# Patient Record
Sex: Male | Born: 1985 | Race: Black or African American | Hispanic: No | Marital: Single | Smoking: Former smoker
Health system: Southern US, Community
[De-identification: ages and names within clinical notes are randomized; demographics above are authoritative.]

## PROBLEM LIST (undated history)

## (undated) DIAGNOSIS — R112 Nausea with vomiting, unspecified: Secondary | ICD-10-CM

## (undated) DIAGNOSIS — M2550 Pain in unspecified joint: Secondary | ICD-10-CM

## (undated) DIAGNOSIS — E079 Disorder of thyroid, unspecified: Secondary | ICD-10-CM

## (undated) DIAGNOSIS — N289 Disorder of kidney and ureter, unspecified: Secondary | ICD-10-CM

## (undated) DIAGNOSIS — R51 Headache: Secondary | ICD-10-CM

## (undated) DIAGNOSIS — M545 Low back pain, unspecified: Secondary | ICD-10-CM

## (undated) DIAGNOSIS — K219 Gastro-esophageal reflux disease without esophagitis: Secondary | ICD-10-CM

## (undated) DIAGNOSIS — M2540 Effusion, unspecified joint: Secondary | ICD-10-CM

## (undated) DIAGNOSIS — G4730 Sleep apnea, unspecified: Secondary | ICD-10-CM

## (undated) DIAGNOSIS — Z8719 Personal history of other diseases of the digestive system: Secondary | ICD-10-CM

## (undated) DIAGNOSIS — N186 End stage renal disease: Secondary | ICD-10-CM

## (undated) DIAGNOSIS — T8859XA Other complications of anesthesia, initial encounter: Secondary | ICD-10-CM

## (undated) DIAGNOSIS — Z8489 Family history of other specified conditions: Secondary | ICD-10-CM

## (undated) DIAGNOSIS — I10 Essential (primary) hypertension: Secondary | ICD-10-CM

## (undated) DIAGNOSIS — G8929 Other chronic pain: Secondary | ICD-10-CM

## (undated) DIAGNOSIS — E213 Hyperparathyroidism, unspecified: Secondary | ICD-10-CM

## (undated) DIAGNOSIS — M1990 Unspecified osteoarthritis, unspecified site: Secondary | ICD-10-CM

## (undated) DIAGNOSIS — Z9889 Other specified postprocedural states: Secondary | ICD-10-CM

## (undated) DIAGNOSIS — R42 Dizziness and giddiness: Secondary | ICD-10-CM

## (undated) DIAGNOSIS — T4145XA Adverse effect of unspecified anesthetic, initial encounter: Secondary | ICD-10-CM

## (undated) DIAGNOSIS — Z992 Dependence on renal dialysis: Secondary | ICD-10-CM

## (undated) DIAGNOSIS — I359 Nonrheumatic aortic valve disorder, unspecified: Secondary | ICD-10-CM

## (undated) HISTORY — DX: Nonrheumatic aortic valve disorder, unspecified: I35.9

## (undated) HISTORY — PX: JOINT REPLACEMENT: SHX530

## (undated) HISTORY — DX: Essential (primary) hypertension: I10

## (undated) HISTORY — DX: Disorder of thyroid, unspecified: E07.9

## (undated) HISTORY — PX: AV FISTULA PLACEMENT: SHX1204

---
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
## 2004-01-24 ENCOUNTER — Ambulatory Visit (HOSPITAL_COMMUNITY): Admission: RE | Admit: 2004-01-24 | Discharge: 2004-01-24 | Payer: Self-pay | Admitting: Nephrology

## 2004-01-24 ENCOUNTER — Encounter (INDEPENDENT_AMBULATORY_CARE_PROVIDER_SITE_OTHER): Payer: Self-pay | Admitting: Specialist

## 2004-01-31 ENCOUNTER — Encounter: Admission: RE | Admit: 2004-01-31 | Discharge: 2004-01-31 | Payer: Self-pay | Admitting: Nephrology

## 2004-02-01 ENCOUNTER — Ambulatory Visit (HOSPITAL_COMMUNITY): Admission: RE | Admit: 2004-02-01 | Discharge: 2004-02-01 | Payer: Self-pay | Admitting: Nephrology

## 2004-04-06 ENCOUNTER — Emergency Department (HOSPITAL_COMMUNITY): Admission: EM | Admit: 2004-04-06 | Discharge: 2004-04-06 | Payer: Self-pay | Admitting: Emergency Medicine

## 2004-05-20 ENCOUNTER — Inpatient Hospital Stay (HOSPITAL_COMMUNITY): Admission: AD | Admit: 2004-05-20 | Discharge: 2004-05-24 | Payer: Self-pay | Admitting: Nephrology

## 2004-06-23 ENCOUNTER — Inpatient Hospital Stay (HOSPITAL_COMMUNITY): Admission: EM | Admit: 2004-06-23 | Discharge: 2004-06-25 | Payer: Self-pay | Admitting: Emergency Medicine

## 2004-09-24 ENCOUNTER — Ambulatory Visit (HOSPITAL_COMMUNITY): Admission: RE | Admit: 2004-09-24 | Discharge: 2004-09-24 | Payer: Self-pay | Admitting: Nephrology

## 2004-11-13 ENCOUNTER — Ambulatory Visit (HOSPITAL_COMMUNITY): Admission: RE | Admit: 2004-11-13 | Discharge: 2004-11-13 | Payer: Self-pay | Admitting: Nephrology

## 2005-02-16 ENCOUNTER — Encounter: Admission: RE | Admit: 2005-02-16 | Discharge: 2005-02-16 | Payer: Self-pay | Admitting: Nephrology

## 2005-04-09 ENCOUNTER — Ambulatory Visit (HOSPITAL_COMMUNITY): Admission: RE | Admit: 2005-04-09 | Discharge: 2005-04-09 | Payer: Self-pay | Admitting: Nephrology

## 2005-04-25 ENCOUNTER — Emergency Department (HOSPITAL_COMMUNITY): Admission: EM | Admit: 2005-04-25 | Discharge: 2005-04-25 | Payer: Self-pay | Admitting: *Deleted

## 2005-04-28 ENCOUNTER — Ambulatory Visit (HOSPITAL_COMMUNITY): Admission: RE | Admit: 2005-04-28 | Discharge: 2005-04-28 | Payer: Self-pay | Admitting: Vascular Surgery

## 2005-08-03 HISTORY — PX: KNEE ARTHROSCOPY: SUR90

## 2005-08-03 HISTORY — PX: AV FISTULA REPAIR: SHX563

## 2005-10-09 ENCOUNTER — Emergency Department (HOSPITAL_COMMUNITY): Admission: EM | Admit: 2005-10-09 | Discharge: 2005-10-09 | Payer: Self-pay | Admitting: Emergency Medicine

## 2005-10-16 ENCOUNTER — Emergency Department (HOSPITAL_COMMUNITY): Admission: EM | Admit: 2005-10-16 | Discharge: 2005-10-16 | Payer: Self-pay | Admitting: Emergency Medicine

## 2005-10-21 ENCOUNTER — Emergency Department (HOSPITAL_COMMUNITY): Admission: EM | Admit: 2005-10-21 | Discharge: 2005-10-21 | Payer: Self-pay | Admitting: Emergency Medicine

## 2005-10-29 ENCOUNTER — Emergency Department (HOSPITAL_COMMUNITY): Admission: EM | Admit: 2005-10-29 | Discharge: 2005-10-29 | Payer: Self-pay | Admitting: Family Medicine

## 2005-11-26 ENCOUNTER — Observation Stay (HOSPITAL_COMMUNITY): Admission: EM | Admit: 2005-11-26 | Discharge: 2005-11-27 | Payer: Self-pay | Admitting: Emergency Medicine

## 2005-12-01 ENCOUNTER — Ambulatory Visit (HOSPITAL_COMMUNITY): Admission: RE | Admit: 2005-12-01 | Discharge: 2005-12-01 | Payer: Self-pay | Admitting: Nephrology

## 2005-12-01 ENCOUNTER — Inpatient Hospital Stay (HOSPITAL_COMMUNITY): Admission: EM | Admit: 2005-12-01 | Discharge: 2005-12-05 | Payer: Self-pay | Admitting: Emergency Medicine

## 2005-12-09 ENCOUNTER — Ambulatory Visit: Payer: Self-pay | Admitting: Gastroenterology

## 2005-12-29 ENCOUNTER — Ambulatory Visit: Payer: Self-pay | Admitting: Gastroenterology

## 2005-12-31 ENCOUNTER — Ambulatory Visit: Payer: Self-pay | Admitting: Gastroenterology

## 2005-12-31 ENCOUNTER — Encounter (INDEPENDENT_AMBULATORY_CARE_PROVIDER_SITE_OTHER): Payer: Self-pay | Admitting: Specialist

## 2006-05-04 ENCOUNTER — Ambulatory Visit (HOSPITAL_COMMUNITY): Admission: RE | Admit: 2006-05-04 | Discharge: 2006-05-04 | Payer: Self-pay | Admitting: Nephrology

## 2006-05-25 ENCOUNTER — Ambulatory Visit (HOSPITAL_COMMUNITY): Admission: RE | Admit: 2006-05-25 | Discharge: 2006-05-25 | Payer: Self-pay | Admitting: Orthopaedic Surgery

## 2006-07-07 ENCOUNTER — Inpatient Hospital Stay (HOSPITAL_COMMUNITY): Admission: EM | Admit: 2006-07-07 | Discharge: 2006-07-10 | Payer: Self-pay | Admitting: Emergency Medicine

## 2006-07-15 ENCOUNTER — Ambulatory Visit: Payer: Self-pay | Admitting: Internal Medicine

## 2006-08-12 ENCOUNTER — Ambulatory Visit (HOSPITAL_COMMUNITY): Admission: RE | Admit: 2006-08-12 | Discharge: 2006-08-12 | Payer: Self-pay | Admitting: Nephrology

## 2006-10-30 ENCOUNTER — Emergency Department (HOSPITAL_COMMUNITY): Admission: EM | Admit: 2006-10-30 | Discharge: 2006-10-30 | Payer: Self-pay | Admitting: Emergency Medicine

## 2006-11-01 ENCOUNTER — Emergency Department (HOSPITAL_COMMUNITY): Admission: EM | Admit: 2006-11-01 | Discharge: 2006-11-01 | Payer: Self-pay | Admitting: Emergency Medicine

## 2006-11-02 ENCOUNTER — Emergency Department (HOSPITAL_COMMUNITY): Admission: EM | Admit: 2006-11-02 | Discharge: 2006-11-03 | Payer: Self-pay | Admitting: Emergency Medicine

## 2006-11-05 ENCOUNTER — Inpatient Hospital Stay (HOSPITAL_COMMUNITY): Admission: EM | Admit: 2006-11-05 | Discharge: 2006-11-06 | Payer: Self-pay | Admitting: Emergency Medicine

## 2006-11-17 ENCOUNTER — Encounter: Admission: RE | Admit: 2006-11-17 | Discharge: 2006-11-17 | Payer: Self-pay | Admitting: Gastroenterology

## 2006-11-17 ENCOUNTER — Ambulatory Visit: Payer: Self-pay | Admitting: Gastroenterology

## 2006-11-25 ENCOUNTER — Ambulatory Visit: Payer: Self-pay | Admitting: Gastroenterology

## 2006-11-25 LAB — CONVERTED CEMR LAB: Iron: 82 ug/dL (ref 42–165)

## 2006-12-07 ENCOUNTER — Ambulatory Visit: Payer: Self-pay | Admitting: Gastroenterology

## 2006-12-07 LAB — CONVERTED CEMR LAB: Hgb A1c MFr Bld: 5.1 % (ref 4.6–6.0)

## 2006-12-16 ENCOUNTER — Ambulatory Visit (HOSPITAL_COMMUNITY): Admission: RE | Admit: 2006-12-16 | Discharge: 2006-12-16 | Payer: Self-pay | Admitting: Gastroenterology

## 2007-10-13 ENCOUNTER — Encounter: Admission: RE | Admit: 2007-10-13 | Discharge: 2007-10-13 | Payer: Self-pay | Admitting: Critical Care Medicine

## 2007-12-05 ENCOUNTER — Emergency Department (HOSPITAL_COMMUNITY): Admission: EM | Admit: 2007-12-05 | Discharge: 2007-12-05 | Payer: Self-pay | Admitting: Emergency Medicine

## 2008-01-09 ENCOUNTER — Emergency Department (HOSPITAL_COMMUNITY): Admission: EM | Admit: 2008-01-09 | Discharge: 2008-01-10 | Payer: Self-pay | Admitting: Family Medicine

## 2008-01-10 ENCOUNTER — Emergency Department (HOSPITAL_COMMUNITY): Admission: EM | Admit: 2008-01-10 | Discharge: 2008-01-11 | Payer: Self-pay | Admitting: Emergency Medicine

## 2008-02-07 ENCOUNTER — Ambulatory Visit (HOSPITAL_COMMUNITY): Admission: RE | Admit: 2008-02-07 | Discharge: 2008-02-07 | Payer: Self-pay | Admitting: Surgery

## 2008-02-16 ENCOUNTER — Encounter: Admission: RE | Admit: 2008-02-16 | Discharge: 2008-02-16 | Payer: Self-pay | Admitting: Surgery

## 2008-05-24 ENCOUNTER — Ambulatory Visit (HOSPITAL_COMMUNITY): Admission: RE | Admit: 2008-05-24 | Discharge: 2008-05-24 | Payer: Self-pay | Admitting: Surgery

## 2008-06-14 ENCOUNTER — Emergency Department (HOSPITAL_COMMUNITY): Admission: EM | Admit: 2008-06-14 | Discharge: 2008-06-14 | Payer: Self-pay | Admitting: Family Medicine

## 2008-06-16 ENCOUNTER — Emergency Department (HOSPITAL_COMMUNITY): Admission: EM | Admit: 2008-06-16 | Discharge: 2008-06-16 | Payer: Self-pay | Admitting: Emergency Medicine

## 2009-01-17 ENCOUNTER — Emergency Department (HOSPITAL_COMMUNITY): Admission: EM | Admit: 2009-01-17 | Discharge: 2009-01-17 | Payer: Self-pay | Admitting: Family Medicine

## 2009-10-09 ENCOUNTER — Emergency Department (HOSPITAL_COMMUNITY): Admission: EM | Admit: 2009-10-09 | Discharge: 2009-10-10 | Payer: Self-pay | Admitting: Emergency Medicine

## 2010-01-08 ENCOUNTER — Emergency Department (HOSPITAL_COMMUNITY): Admission: EM | Admit: 2010-01-08 | Discharge: 2010-01-08 | Payer: Self-pay | Admitting: Emergency Medicine

## 2010-01-31 ENCOUNTER — Ambulatory Visit (HOSPITAL_COMMUNITY): Admission: RE | Admit: 2010-01-31 | Discharge: 2010-01-31 | Payer: Self-pay | Admitting: Nephrology

## 2010-04-08 ENCOUNTER — Emergency Department (HOSPITAL_COMMUNITY): Admission: EM | Admit: 2010-04-08 | Discharge: 2010-04-09 | Payer: Self-pay | Admitting: Emergency Medicine

## 2010-07-25 ENCOUNTER — Emergency Department (HOSPITAL_COMMUNITY)
Admission: EM | Admit: 2010-07-25 | Discharge: 2010-07-25 | Payer: Self-pay | Source: Home / Self Care | Admitting: Family Medicine

## 2010-08-24 ENCOUNTER — Encounter: Payer: Self-pay | Admitting: Nephrology

## 2010-10-16 LAB — COMPREHENSIVE METABOLIC PANEL
AST: 15 U/L (ref 0–37)
BUN: 37 mg/dL — ABNORMAL HIGH (ref 6–23)
CO2: 26 mEq/L (ref 19–32)
Calcium: 10 mg/dL (ref 8.4–10.5)
Creatinine, Ser: 10.73 mg/dL — ABNORMAL HIGH (ref 0.4–1.5)
Sodium: 140 mEq/L (ref 135–145)
Total Bilirubin: 0.6 mg/dL (ref 0.3–1.2)
Total Protein: 7.2 g/dL (ref 6.0–8.3)

## 2010-10-16 LAB — DIFFERENTIAL
Eosinophils Absolute: 0.1 10*3/uL (ref 0.0–0.7)
Lymphs Abs: 2.8 10*3/uL (ref 0.7–4.0)
Monocytes Absolute: 0.6 10*3/uL (ref 0.1–1.0)
Monocytes Relative: 7 % (ref 3–12)
Neutrophils Relative %: 56 % (ref 43–77)

## 2010-10-16 LAB — CBC
HCT: 33.9 % — ABNORMAL LOW (ref 39.0–52.0)
Hemoglobin: 11.5 g/dL — ABNORMAL LOW (ref 13.0–17.0)
MCH: 31.8 pg (ref 26.0–34.0)
MCV: 93.9 fL (ref 78.0–100.0)
Platelets: 209 10*3/uL (ref 150–400)
RBC: 3.61 MIL/uL — ABNORMAL LOW (ref 4.22–5.81)
RDW: 12.8 % (ref 11.5–15.5)
WBC: 8 10*3/uL (ref 4.0–10.5)

## 2010-10-16 LAB — LIPASE, BLOOD: Lipase: 28 U/L (ref 11–59)

## 2010-10-20 LAB — URINE CULTURE

## 2010-10-20 LAB — POCT URINALYSIS DIP (DEVICE): Glucose, UA: 100 mg/dL — AB

## 2010-12-16 NOTE — Assessment & Plan Note (Signed)
[facility] OFFICE NOTE   NAME:WEEKSKyier, Burrage                       MRN:          [id number]  DATE:12/07/2006                            DOB:          [birth year]    PROBLEM:  Pancreatitis.   The reason Mr. Youngerman has returned for scheduled followup.  Since his  last visit, he has felt well and has had no further episodes of  abdominal pain.  An MRCP and MRI were unremarkable, except for some iron  deposition in the liver.  Iron studies were not suggestive of  hemachromatosis (serum iron 82, transferrin 180 and ferritin 705).  Mr.  Neidich does complain of persistent nausea.  It is noteworthy that he has  had slightly elevated glucoses in the past.   EXAMINATION:  VITAL SIGNS:  Pulse 60, blood pressure 108/68, weight 300.   IMPRESSION:  1. Recurrent idiopathic pancreatitis.  2. Nausea.  I suspect he may have gastroparesis, perhaps related to      underlying diabetes.  3. End-stage renal disease - on dialysis.   RECOMMENDATIONS:  1. Gastric emptying scan.  2. Check hemoglobin A1c.  3. No further GI workup for his recurrent pancreatitis, at this time.     Sandy Salaam. Deatra Ina, MD,FACG  Electronically Signed    RDK/MedQ  DD: 12/07/2006  DT: 12/07/2006  Job #: [id number]   cc:   Jeneen Rinks L. Deterding, M.D.

## 2010-12-19 NOTE — H&P (Signed)
NAMEJORIN, LERSCH                ACCOUNT NO.:  [id number]   MEDICAL RECORD NO.:  [id number]          PATIENT TYPE:  INP   LOCATION:  5511                         FACILITY:  [facility]   PHYSICIAN:  Maudie Flakes. Hassell Done, M.D.   DATE OF BIRTH:  [birth year]   DATE OF ADMISSION:  06/23/2004  DATE OF DISCHARGE:                                HISTORY & PHYSICAL   REASON FOR ADMISSION:  Abdominal pain.   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  The patient began hemodialysis approximately 5 Mago ago.  He is  followed by Dr. Corliss Parish.  He reports that his pain began this  morning.  The pain comes and goes.  It was a 10/10 at its worst times, and  is now a 5/10 in intensity.  The pain is worsened with any movements, and he  has had no relief from his pain throughout the day.  He has had 3 episodes  of vomiting, once which occurred during his hemodialysis session.  He has  had nausea all day.  He has had relief of the pain following his vomitus.  He has also had a period of 3-4 days with liquid stools and diarrhea.  He  has no home sick contacts.  He has had 2 previous episodes similar to this  one, one in September of 2005, and the other one in the summer of 2005.  Both were diagnosed with viral gastroenteritis.  He currently denies any  chest pain, shortness of breath, blurred vision, headache, or any syncopal  episodes.   PAST MEDICAL HISTORY:  1.  End-stage renal disease.  Hemodialysis Monday, Wednesday, and Friday.  2.  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  3.  Anemia of chronic disease.  4.  Morbid obesity.  5.  Hyperparathyroidism.  6.  Hyperlipidemia.   MEDICATIONS:  (Per the patient) -  1.  Cyclosporin 150 mg daily.  2.  Lipitor 10 mg daily.  3.  Altace 2.5 mg daily.  4.  Tums EX, taken once a.c.  5.  Prevacid 30 mg daily.  (The patient had been on prednisone, but he said this was discontinued  approximately 2-3 Butner ago, per his nephrologist.)   ALLERGIES:  No known drug allergies.   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  He  lives with his mother.  He denies any smoking, alcohol, or drug abuse.   FAMILY HISTORY:  His grandmother did have diabetes, as well as a kidney  disease.   PHYSICAL EXAMINATION:  GENERAL:  Morbid obese African-American male.  He is  moaning in bed.  He is pleasant, but clearly in pain.  VITAL SIGNS:  Temperature 97.0, blood pressure 144/93 mmHg, pulse 102 and  regular, respirations 24, saturating 99% on room air.  HEENT:  Pupils equal, round and reactive to light.  Extraocular  muscles are  intact.  Sclerae are anicteric.  Conjunctivae are moist.  NECK:  Supple but obese.  There is no JVD or thyromegaly, ___________, or  carotid bruits.  Trachea is midline.  CARDIOVASCULAR:  S1 and S2 is tachycardic but regular.  There are no  murmurs, rubs, or gallops.  There is no S2 or S4.  LUNGS:  Bilaterally clear to auscultation.  There are no rales, rhonchi, or  wheezes.  ABDOMEN:  Morbid obese, soft.  There is positive pain on palpation right  lower quadrant greater than left lower quadrant.  There is no rebound.  Bowel sounds are hypoactive in all quadrants, but present.  I cannot assess  for organomegaly secondary to the body habitus.  EXTREMITIES:  Morbidly obese.  Cannot palpation DP/PT bilaterally.  Right AV  fistula with positive bruit/thrill.  Right subclavian catheter, Diatek  catheter.  Non-erythematous.  NEUROLOGIC:  Alert, awake, oriented x3.  Cranial nerves II-XII grossly  intact, and nonfocal.   LABORATORY DATA:  Amylase 108, lipase 22.  Sodium 138, potassium 4.3,  chloride 104, bicarb 25, BUN 7,  creatinine 4.2, glucose 80.  WBC 16,  hemoglobin 13.9, hematocrit 40.7, platelets 394.  Total bilirubin 1.2,  alkaline phosphatase 73, AST 22, ALT 12, total protein 5.1, album 1.5,  calcium 8.2.  Abdominal x-ray shows (1) no pulmonary edema, (2) no bowel  dilation, (3) paucity of bowel gas.   ASSESSMENT AND PLAN:  1.  Abdominal pain with positive nausea and vomiting x1 day with      leukocytosis.  Could this be appendicitis?  Will obtain a surgical      consult.  CT of the abdomen and pelvis with oral contrast.  Will also      rule out small bowel obstruction, also with CT of the abdomen with p.o.      contrast.  Will also obtain blood cultures x2 sets, and will begin      patient empirically on Unasyn 1.5 gm IV q.12h.  2.  End-stage renal disease.  Hemodialysis Monday, Wednesday, and Friday.      Will continue hemodialysis while the patient is hospitalized.  Will      continue as his scheduled hemodialysis as an outpatient.  3.  Will hold all p.o. medications.  Continue the patient NPO pending a      surgical consult.  If blood pressure drops, will start the patient on IV      Solu-Medrol.  For now, will hold all p.o. medications.  The patient will      simply be started on Phenergan and morphine for nausea and vomiting and      pain, as well as Unasyn.   The case was discussed with Dr. Hassell Done.       IO/MEDQ  D:  06/23/2004  T:  06/24/2004  Job:  [id number]

## 2010-12-19 NOTE — Op Note (Signed)
NAMEDESMAN, BALLARD                ACCOUNT NO.:  [id number]   MEDICAL RECORD NO.:  [id number]          PATIENT TYPE:  EMS   LOCATION:  MAJO                         FACILITY:  [facility]   PHYSICIAN:  Judeth Cornfield. Scot Dock, M.D.DATE OF BIRTH:  Dec 24, 1985   DATE OF PROCEDURE:  04/28/2005  DATE OF DISCHARGE:  04/25/2005                                 OPERATIVE REPORT   PREOPERATIVE DIAGNOSIS:  Chronic renal failure.   POSTOPERATIVE DIAGNOSIS:  Chronic renal failure.   PROCEDURE:  Placement of left forearm arteriovenous fistula.   SURGEON:  Judeth Cornfield. Scot Dock, M.D.   ASSISTANT:  Leta Baptist, PA   ANESTHESIA:  Local with sedation.   TECHNIQUE:  The patient was taken to the operating room and sedated by  Anesthesia.  The left upper extremity was prepped and draped in the usual  sterile fashion.  After the skin was infiltrated with 1% lidocaine, a  longitudinal incision was made over the cephalic vein, which was fairly far  out on the radial aspect of the distal forearm.  The vein appeared to be of  good reasonable caliber and it was ligated distally and irrigated up nicely  with heparinized saline.  The artery was quite a distance from the vein; I  therefore had to make a separate incision over the artery.  Despite using 1%  lidocaine without epinephrine, the artery did spasm significantly.  It was a  fairly small artery.  Given his young age, however, I felt it would be worth  trying a forearm fistula.  The patient was heparinized.  The vein had been  distended up and was brought through the tunnel to the incision at the level  of the radial artery.  The radial artery was clamped proximally and  distally, and a longitudinal arteriotomy was made.  The vein was mobilized  over and sewn end-to-side to the artery using 2 continuous 6-0 Prolene  sutures.  At the completion, there was a palpable thrill in the fistula.  Hemostasis was obtained in the wounds.  The wounds were closed  with a deep  layer of 3-0 Vicryl and the skin closed with 4-0 Vicryl.  A sterile dressing  was applied.  The patient tolerated the procedure well and was transferred  to the recovery room in satisfactory condition.  All needle and sponge  counts were correct.      Judeth Cornfield. Scot Dock, M.D.  Electronically Signed     CSD/MEDQ  D:  04/28/2005  T:  04/29/2005  Job:  [id number]

## 2010-12-19 NOTE — Discharge Summary (Signed)
NAMEBRENNYN, MODE                ACCOUNT NO.:  [id number]   MEDICAL RECORD NO.:  [id number]          PATIENT TYPE:  INP   LOCATION:  5527                         FACILITY:  [facility]   PHYSICIAN:  Louis Meckel, M.D.DATE OF BIRTH:  13-Jul-1986   DATE OF ADMISSION:  05/20/2004  DATE OF DISCHARGE:  05/24/2004                                 DISCHARGE SUMMARY   ADMISSION DIAGNOSES:  1.  FSG with severe nephrotic syndrome refractory to outpatient therapy.  2.  Morbid obesity.   DISCHARGE DIAGNOSES:  1.  End-stage renal disease secondary to severe nephrotic syndrome/FSG class      type picture.  2.  Anemia of chronic disease.  3.  Morbid obesity.  4.  Hyperparathyroidism.  5.  Hyperlipidemia.   PROCEDURES:  1.  May 20, 2004, Dr. Hassell Done, placement of hemodialysis catheter for      hemodialysis and ultrafiltration.  2.  May 22, 2004, Dr. Ruta Hinds, placement of right internal      jugular Diatek catheter, right forearm A-V fistula.   BRIEF HISTORY:  [name] is a 25 year old black male followed by Dr.  Moshe Cipro in the office with history of morbid obesity and referred to  Dr. Moshe Cipro initially June 2005 with nephrotic syndrome, albumin 1.5,  creatinine 1.5.  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  His HIV was negative.  A 24-hour urine protein showed 10 g.  The patient was  placed on high-dose steroids.  Had no significant response.  Continue with  albumin less than 1.  Unable to diurese patient despite large doses of  diuretics as an outpatient.  Then the patient was placed on cyclosporin  August 2005.  Question of compliance with medications because he was not  complaint with the appointments in following up with Dr. Moshe Cipro.  The  patient continued to be massively volume overloaded, low of 310 pounds on  admission, up to over 350 pounds with weeping lower extremity secondary to  edema.  His creatinine also was worsening, 3.2 on  admission, and Dr.  Moshe Cipro admitted patient for IV diuretics, observation, evaluation,  treatment, and check of cyclosporin levels.   PHYSICAL EXAMINATION:  GENERAL:  Exam on admission showed black male,  morbidly obese, pleasant, no acute distress.  VITAL SIGNS:  Blood pressure 158/100, pulse 92 and regular.  HEENT:  Head normocephalic.  PERRLA.  Extraocular movements within normal  limits.  Mucous membranes moist.  NECK:  Positive JVD.  LUNGS:  Decreased ___________.  CARDIAC:  Tachycardic rhythm.  Regular.  No murmur, no gallops, no rubs  appreciated.  ABDOMEN:  Bowel sounds positive, within normal limits, and morbidly obese,  soft, nontender.  Noted to have abdominal wall edema.  EXTREMITIES:  Diffuse lower extremity weeping, pedal edema.   Laboratory on admission, BUN 16, creatinine 3.1, potassium 3.0, sodium 143,  calcium 6.1, albumin 1.2, phosphorus 6.9, hemoglobin 12.2.  The patient was  admitted, as noted above, by Dr. Moshe Cipro for evaluation and check  cyclosporin level, replace his potassium.   FURTHER COURSE IN HOSPITAL:  #1.  END-STAGE RENAL DISEASE  WITH NEPHROTIC SYNDROME:  The patient noted by  Dr. Kem Mend, admitted.  The next day seen by Dr. Moshe Cipro.  On Dr. Jason Nest  evaluation, thought patient would need dialysis with ultrafiltration.  Thus,  he plan internal jugular Flexicon catheter in patient without complications,  and he had hemodialysis on May 20, 2004, with 4 liters UF.  Blood  pressure tolerated it, and CVTS was consulted for placement of tunnel  PermCath and graft for fistula.  Dr. Kellie Simmering saw the patient on October 19.  Dr. Oneida Alar placed the internal jugular PermCath in the right radial cephalic  A-V fistula without complications on October 20.  Arrangements were made for  outpatient hemodialysis which would be [facility].  Social worker referral, and she saw patient and discharge schedule was  designed.  During hospital  stay, the patient had four dialysis treatments.  Volume was down to 144 kg on October 22 compared to 166.7 kg per  hemodialysis on October 18.  He states he is feeling better.  No steal  syndrome, and his hand did have some swelling but was instructed on how to  elevate his hand where his A-V fistula was placed.   As discussed with Dr. Moshe Cipro, the patient was left on his IV diuretics  of Lasix,  Zaroxolyn, also was continued on cyclosporin and prednisone in addition to  Altace because of his protein output and need to get his disease into  remission.  Cyclosporin level was pending at time of discharge.  He was to  have followup at the [facility] on Tuesday, Thursday, and  Saturday schedule.  His new dry weight will be 141 kg and to be evaluated at  outpatient unit also for tapering down.   #2.  ANEMIA:  Labs on admission showed that patient had hemoglobin of 7.7.  He was transfused 2 units packed red blood cells.  Iron studies showed he  was slightly iron deficient, transferrin saturation 27%, ferritin level 667.  He was started on InFeD and Epogen.  Hemoglobin was 8.6 before discharge on  October 22.  Will have hemoglobin followed in the outpatient kidney center  __________ appropriately with hemoglobin followup.   #3.  SECONDARY HYPERPARATHYROIDISM:  The patient had intact PTH level of  197.4.  He was started on IV Calcitriol.  Calcium on admission was 6.2 with  albumin level of less than 1.0 and phosphorus of 4.3.  Phosphorus near  discharge on October 22 was 4.1, calcium 6.9, and albumin now 1.2.  He was  placed on phosphate binders and Tums EX, and will have followup as  outpatient of calcium, phosphorus, and PTH levels.   #4.  MORBID OBESITY:  Noted on admission, the patient was over 350 pounds.  He lost 22 kg which was thought to be attributed to UF/diuresis and needs to  be tapered down further.  Dietician is to follow up with patient.  There  was discussed possibility of need for gastric bypass in this patient as he is  only 25 years old, but will see how weight loss is as an outpatient with  administration of  hemodialysis to follow up his albumin.   #5.  HYPERLIPIDEMIA:  During hospital stay, he was continued on statin and  will be continued on Lipitor which he was on before.   MEDICATIONS:  1.  Nephro-Vite 1 daily.  2.  Zaroxolyn 10 mg daily.  3.  Lasix 80 mg 2 t.i.d.  4.  Prednisone 20  mg daily.  5.  Cyclosporin 200 mg b.i.d.  6.  Tums EX 1 a.c.  7.  Lipitor 10 mg daily.  8.  Prevacid 30 mg daily.  9.  Altace 10 mg b.i.d., hold morning of dialysis.  10. Epogen 20,000 units each dialysis.  11. Calcitriol 0.5 IV each dialysis.  12. InFeD 100 mg weekly on hemodialysis.   ACTIVITY:  As tolerated.   DIET:  Renal failure diet as instructed in the hospital. ___________  1200  ml p.o.  daily   WOUND CARE:  He was instructed to keep PermCath dressing dry and clear.  No  showers.  Will start __________ A-V fistula a week and one-half after  surgery.   FOLLOW UP:  1.  Dr. Oneida Alar on June 21, 2004.  2.  Nephrology MD at [facility] Tuesday, Thursday, and      Saturday schedule at 6:30 a.m.       DWZ/MEDQ  D:  05/24/2004  T:  05/24/2004  Job:  [id number]   cc:   [place]

## 2010-12-19 NOTE — Consult Note (Signed)
[name], DONICA                ACCOUNT NO.:  [id number]   MEDICAL RECORD NO.:  [id number]          PATIENT TYPE:  INP   LOCATION:  5511                         FACILITY:  [facility]   PHYSICIAN:  Sammuel Hines. Daiva Nakayama, M.D. DATE OF BIRTH:  [birth year]   DATE OF CONSULTATION:  06/23/2004  DATE OF DISCHARGE:                                   CONSULTATION   HISTORY OF PRESENT ILLNESS:  [name] is a 25 year old black male with end-  stage renal disease requiring hemodialysis.  He presents today with  abdominal pain, nausea and vomiting that started this morning.  He denies  any fevers.  He denied any diarrhea to me but he did tell the renal doctors  that he has been having diarrhea for the last three to four days.  He denies  any fevers.  He says the pain is in his epigastric region, lower abdomen,  and into his back.  He has apparently had some similar episodes of this in  the last six months which has been diagnosed as gastroenteritis.   PAST MEDICAL HISTORY:  1.  End-stage renal disease from focal segmental glomerulosclerosis with      severe nephrotic syndrome.  2.  Anemia.  3.  Morbid obesity.  4.  Hyperparathyroidism.  5.  Hyperlipidemia.   PAST SURGICAL HISTORY:  Significant for placement of dialysis catheter and  fistula.   MEDICATIONS:  1.  Cyclosporin 150 mg q.d.  2.  Lipitor 10 mg q.d.  3.  Altace 2.5 mg q.d.   ALLERGIES:  No known drug allergies.   SOCIAL HISTORY:  Denies any use of alcohol or tobacco products.   FAMILY HISTORY:  Significant for diabetes in his grandmother.   PHYSICAL EXAMINATION:  VITAL SIGNS:  His temperature is 98.7, blood pressure  149/94, pulse 86.  GENERAL:  He is a well-developed, well-nourished black male in no acute  distress.  SKIN:  Warm and dry with no jaundice.  HEENT:  Extraocular movements intact.  Pupils are equal, round, and reactive  to light.  Sclerae are nonicteric.  LUNGS:  Clear bilaterally with no use of accessory respiratory  muscles.  HEART:  Regular rate and rhythm with a impulse in left chest.  ABDOMEN:  Soft with some mild lower abdominal tenderness, right possibly  greater than left.  No guarding or peritoneal signs.  No palpable mass or  hepatosplenomegaly.  EXTREMITIES:  He does have some edema of his lower extremities.  No cyanosis  or clubbing.  Good strength in his arms and legs.  NEUROLOGICAL:  He is alert and oriented x 3 with no evidence of anxiety or  depression.   LABORATORY DATA:  On review of his lab work, it was significant for a white  count of 16,000.   ASSESSMENT/PLAN:  This is a 25 year old black male with lower abdominal  pain, appendicitis versus enteritis or colitis would certainly be a part of  his differential.  We will plan for a CT scan tonight to help [place]  differentiate and I would agree with covering with broad-spectrum  antibiotics.  We  will follow closely with you and follow up on the scan once  it is done.  We appreciate the opportunity to help with the care of this  patient.      [name]   PST/MEDQ  D:  06/23/2004  T:  06/23/2004  Job:  [id number]

## 2010-12-19 NOTE — H&P (Signed)
NAMECORNEILUS, [name]                ACCOUNT NO.:  [id number]   MEDICAL RECORD NO.:  [id number]          PATIENT TYPE:  INP   LOCATION:  [id number]                         FACILITY:  [facility]   PHYSICIAN:  Yong Channel, MD        DATE OF BIRTH:  [birth year]   DATE OF ADMISSION:  11/26/2005  DATE OF DISCHARGE:                                HISTORY & PHYSICAL   REASON. FOR ADMISSION:  Nausea, vomiting, abdominal pain.   25 year old man with past medical history significant for end-stage renal  disease secondary to FSG on hemodialysis Monday, Wednesday, Friday at [facility] who comes today complaining of two days of epigastric pain plus  nausea and vomiting.  The pain started first in the epigastrium described as  sharp, comes and goes, then nausea and vomiting.  In two opportunities in  different days he has had some blood clots in his vomiting.  He denies  chills, fevers.  He denies use of NSAIDs or alcohol or new medications.   REVIEW OF SYSTEMS:  Negative for diarrhea.  No melena.  No hematochezia.  No  chest pain.  No shortness of breath.  No cough.   PAST MEDICAL HISTORY:  1.  Positive for end-stage renal disease secondary to FSG on hemodialysis      since October 2006.  2.  Anemia of chronic disease.  3.  Morbid obesity.  4.  Hyperparathyroidism.  5.  Hyperlipidemia.   MEDICATION LIST:  1.  Ethyl chloride spray pre dialysis p.r.n.  2.  Quinine sulfate 325 mg p.o. b.i.d. p.r.n.  3.  Sorbitol 60 mL q.4h. p.r.n. for constipation.  4.  Percocet 5/325 one to two tablets q.6h. p.r.n. for pain.  5.  Ibuprofen 600 mg p.o. t.i.d. p.r.n.  6.  Lisinopril 40 mg p.o. q.h.s.  7.  Fosrenol 1 g p.o. with meals t.i.d.  8.  Tums three tablets q.a.c.  9.  Benadryl 25 mg p.o. t.i.d. p.r.n.  10. Nephro-Vite one tablet daily.  11. Lipitor 20 mg p.o. daily.  12. Prevacid 30 mg p.o. b.i.d.  13. Phenergan 25 mg p.o. q.8h. p.r.n.  14. Colace 200 mg q.h.s.  15. Dulcolax suppositories one q.12h.  p.r.n.  16. Epogen 4000 units IV every dialysis.  17. Hectorol 5.5 mcg IV every dialysis.   ALLERGIES:  Not known drug allergies.   SOCIAL HISTORY:  He lives in [place] with his mother and he is on  disability.   PHYSICAL EXAMINATION:  VITAL SIGNS:  Temperature 97.4, pulse 70, respiratory  rate 22, saturation of oxygen 99% on room air, blood pressure 122/66.  GENERAL:  This is a patient lying on bed in no acute distress.  HEENT:  PERRLA.  Extraocular movement intact.  Mucosa is moist.  NECK:  Supple.  No JVD.  HEART:  Regular rate and rhythm with no murmurs.  LUNGS:  Clear to auscultation bilaterally.  ABDOMEN:  Soft, mildly tender on epigastrium.  Bowel sounds are 2+.  No  rebound tenderness.  No CVA tenderness.  EXTREMITIES:  Trace edema.  Pulse okay.  AV fistula on  right wrist okay.   LABORATORIES:  White blood cell count 10.3, hemoglobin 13.3, platelets 225,  MCV 92.1, ANC 10.2.  Sodium 138, potassium 3.7, chloride 85, bicarbonate 29,  BUN 83, creatinine 21, glucose 122, calcium 9.5, bilirubin 1.2, albumin 4.4.  LFTs within normal limits.  Amylase 653, lipase 266.   ASSESSMENT/PLAN:  1.  Acute pancreatitis, no clear cause as the patient denies use of alcohol,      NSAIDs, question lisinopril.  The plan is to put the patient n.p.o.,      treat the pain with intravenous Dilaudid, intravenous Zofran, hold      lisinopril, check a UDS, also check a urinalysis for culture and      sensitivity, Protonix intravenous, and do a CT of the abdomen and pelvis      with intravenous contrast.  2.  Question upper gastrointestinal bleed, question Mallory-Weiss.  We will      observe the patient, follow CBC and keep him n.p.o. __________      medications already dictated.  3.  End-stage renal disease on hemodialysis.  Patient missed his dialysis      yesterday.  He does not look volume overloaded.  Potassium is okay.  If      everything is stable we will dialyze him tomorrow.  Otherwise,  if he      becomes more uremic we are going to dialyze him today.  4.  Hyperparathyroidism.  We will continue Hectorol every dialysis.  5.  Anemia.  We will continue Epogen.  6.  Dyslipidemia.  We will check a fasting lipid panel on a.m. to rule out      hypertriglyceridemia.  7.  Deep venous thrombosis prophylaxis.  Will keep him on PAS hose while he      is in bed.  I saw and discussed this patient with Dr. Marval Regal.      Yong Channel, MD     YC/MEDQ  D:  11/26/2005  T:  11/26/2005  Job:  [id number]

## 2010-12-19 NOTE — Discharge Summary (Signed)
[name], [name]                ACCOUNT NO.:  [id number]   MEDICAL RECORD NO.:  [id number]          PATIENT TYPE:  INP   LOCATION:  5502                         FACILITY:  [facility]   PHYSICIAN:  James L. Deterding, M.D.DATE OF BIRTH:  March 27, 1986   DATE OF ADMISSION:  07/07/2006  DATE OF DISCHARGE:  07/10/2006                               DISCHARGE SUMMARY   ADMITTING DIAGNOSES:  1. Abdominal pain.  2. End-stage renal disease on chronic hemodialysis.  3. Hypertension.  4. Anemia of chronic disease.  5. Obesity.  6. Hyperlipidemia.   DISCHARGE DIAGNOSES:  1. Abdominal pain, resolved, with negative workup.  2. End-stage renal disease on chronic hemodialysis.  3. Hypertension.  4. Anemia of chronic disease.  5. Obesity.  6. Hyperlipidemia.   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  He has been admitted in April  and May of 2007 with abdominal pain.  One episode was attributed to  pancreatitis with the other episode of colitis of unknown cause.  On the  morning of this admission, the patient woke up with abdominal pain.  He  vomited 2 times, and again 3 times in the emergency room.  He is still  complaining of severe abdominal pain.  Denies diarrhea or trauma.  The  pain is located more in the upper quadrants than the lower quadrants.   LABORATORIES ON ADMISSION:  Sodium 135, potassium 3.9, chloride 101, BUN  72, creatinine 13.5, glucose 114, albumin 3.3.  LFTs within normal  limits.  Total bilirubin 0.6, amylase 235, lipase 34.  Urine is yellow  with moderate leukocyte esterase, 3-6 white blood cells, 0-2 red blood  cells per high power field.  Hemoglobin 10.7, white count 9000,  hematocrit 31.8, platelets 213,000.  Stool Hemoccult is negative.   HOSPITAL COURSE:   The patient was admitted, placed on his usual  medications and given Nexium 40 mg daily.  For pain control, Toradol was  given in IV form, and he was placed on a clear liquid diet.  GI was  consulted who switched his Nexium to Protonix 40 mg IV every 12 hours.  They checked a sedimentation rate which returned at 34.  Workup included  an abdominal ultrasound which showed no abnormalities identified.  Specifically, the liver, gallbladder and common bile ducts were normal  in appearance.  This was followed with an upper GI with small-bowel  follow-through which was unremarkable.  Over the course of his  hospitalization, his abdominal pain resolved.  It is unclear as to the  etiology of his pain.  He was discharged home to take Protonix at 80 mg  at bedtime in addition to his usual medications.   He was dialyzed at his usual Monday, Wednesday, Friday schedule.  Lowest  attained weight was 123.3 kg with hypotension during dialysis as low as  87/53.  His dry weight at the [facility] remains at 121 kg.   DISCHARGE MEDICATIONS:  1. Nephro-Vite vitamin one daily.  2. PhosLo 667 mg five with meals.  3. Lisinopril 40 mg at bedtime.  4. Lipitor 20 mg with supper.  5. Protonix 80 mg at bedtime.  6. EPO 25,000 units IV each dialysis.  7. Hectorol 7.5 mcg IV each dialysis.   Dialysis Monday, Wednesday, Friday at the [facility].      Nonah Mattes, P.A.    ______________________________  Joyice Faster. Deterding, M.D.    RRK/MEDQ  D:  08/29/2006  T:  08/29/2006  Job:  [id number]   cc:   Gatha Mayer, MD,FACG

## 2010-12-19 NOTE — Op Note (Signed)
NAMEMALIQ, INCLAN                ACCOUNT NO.:  [id number]   MEDICAL RECORD NO.:  [id number]          PATIENT TYPE:  AMB   LOCATION:  SDS                          FACILITY:  [facility]   PHYSICIAN:  Lind Guest. Ninfa Linden, M.D.DATE OF BIRTH:  04-Aug-1985   DATE OF PROCEDURE:  05/25/2006  DATE OF DISCHARGE:  05/25/2006                                 OPERATIVE REPORT   PREOPERATIVE DIAGNOSIS:  Left valgus knee with lateral meniscal tear.   POSTOPERATIVE DIAGNOSIS:  Left valgus knee with lateral meniscal tear.   PROCEDURE:  Left knee arthroscopy with debridement and partial lateral  meniscectomy.   SURGEON:  Lind Guest. Ninfa Linden, M.D.   ANESTHESIA:  General via LMA.   ANTIBIOTICS:  1 g IV Ancef.   BLOOD LOSS:  Minimal.   TOURNIQUET TIME:  27 minutes.   COMPLICATIONS:  None.   INDICATIONS:  Briefly, Zackariah is a 25 year old with end-stage renal disease  who is morbidly obese with valgus deformities to both his knees.  He  developed lateral joint line tenderness at only age 73. An MRI did confirm  lateral meniscal tearing, and I recommended he undergo arthroscopy with  likely debridement and partial medial meniscectomy.  The risks and benefits  of this were explained to him and well understood, and he agreed proceed  with surgery.   PROCEDURE:  After informed consent was obtained, he was brought to operating  room and placed supine on the operating table.  He was identified as the  correct patient and the correct extremity before making the incision.  General anesthesia was then obtained.  I placed a nonsterile tourniquet  around his upper left thigh. His leg was then prepped and draped with  DuraPrep and sterile drapes including a sterile stockinette.  I used a  lateral leg __________  with the bed raised.  I had the leg flexed off the  side of the table. Anterolateral portal was then made just off of the  lateral edge of the patellar tendon at the level of the inferior pole  of the  patella.  The arthroscope was inserted, and I immediately made the  anteromedial portal just medial to the patellar tendon.  The medial  compartment was assessed and found to be intact in terms of the meniscus and  cartilage. The ACL was intact, and I probed this as well.  I went over to  the lateral side, and there was anterior horn and posterior horn lateral  meniscal tearing and degenerative changes only on the tibial plateau. The  cartilage on the femur was actually intact, and I probe this.  I then  inserted the arthroscopic shaver and performed a partial lateral  meniscectomy of both the anterior posterior aspects of this, as well as  minimal debridement of synovitis in this area.  I then assessed the patella,  and this was intact as well.  I then allowed the knee to be lavaged with  fluid.  All instrumentation was removed.  I then closed the portal sites  with interrupted 4-0 nylon suture.  I then infiltrated the knee with a  mixture of clonidine, morphine, and Marcaine, and  then used Marcaine in the portal sites.  Xeroform followed by a well-padded  sterile dressing were applied, and I let the tourniquet down at 27 minutes.  His toes did pink nicely.  He was awakened, extubated, and taken to the  recovery room in stable condition.  There were no complications noted, and  all final counts were correct.           ______________________________  Lind Guest. Ninfa Linden, M.D.     CYB/MEDQ  D:  05/25/2006  T:  05/26/2006  Job:  [id number]

## 2010-12-19 NOTE — Assessment & Plan Note (Signed)
[facility] OFFICE NOTE   NAME:WEEKSFeynman, Faro                       MRN:          [id number]  DATE:11/17/2006                            DOB:          1986-02-03    PROBLEM:  Recurrent pancreatitis.   Mr. Certo has returned for re-evaluation.  Approximately two [place] ago,  he was hospitalized for abdominal pain.  Lab was consistent with acute  pancreatitis, as evidenced by elevated amylase and lipase.  Lipase was  elevated at 113.  His LFTs were normal.  Abdominal pain has slowly  subsided.  His main complaint at the current time is fatigue.  Last  year, he underwent an upper endoscopy to rule out posterior penetrating  ulcer.  MRCP was recommended, though it was ultimately not done.   MEDICATIONS INCLUDE:  1. Lipitor.  2. Nephro-Vite.  3. Lisinopril.  4. Epogen.  5. Hectorol.  6. Prilosec.  7. Phos-Lo.  8. InFed.   PHYSICAL EXAMINATION:  Pulse 70, blood pressure 102/60, weight 297.6.  HEENT: EOMI. PERRLA. Sclerae are anicteric.  Conjunctivae are pink.  NECK:  Supple without thyromegaly, adenopathy or carotid bruits.  CHEST:  Clear to auscultation and percussion without adventitious  sounds.  CARDIAC:  Regular rhythm; normal S1 S2.  There are no murmurs, gallops  or rubs.  ABDOMEN:  Bowel sounds are normoactive.  Abdomen is soft, non-tender and  non-distended.  There are no abdominal masses, tenderness, splenic  enlargement or hepatomegaly.  EXTREMITIES:  Full range of motion.  No cyanosis, clubbing or edema.  RECTAL:  Deferred.   IMPRESSION:  Idiopathic recurrent acute pancreatitis.   RECOMMENDATIONS:  MRCP.     Sandy Salaam. Deatra Ina, MD,FACG  Electronically Signed    RDK/MedQ  DD: 11/17/2006  DT: 11/17/2006  Job #: [id number]   cc:   Donato Heinz, M.D.

## 2010-12-19 NOTE — H&P (Signed)
[name], [name]                ACCOUNT NO.:  [id number]   MEDICAL RECORD NO.:  [id number]          PATIENT TYPE:  INP   LOCATION:  [id number]                         FACILITY:  [facility]   PHYSICIAN:  Sol Blazing, M.D.DATE OF BIRTH:  21-May-1986   DATE OF ADMISSION:  11/05/2006  DATE OF DISCHARGE:  11/06/2006                              HISTORY & PHYSICAL   REASON FOR ADMISSION:  Abdominal pain, nausea, vomiting.   The patient is a 25 year old African American male with end-stage renal  disease due to hypertension.  He was on chronic hemodialysis Monday,  Wednesday, and Friday at [facility].  He has a right  arm AV fistula.  He has hypertension, obesity and has had several  admissions and presentations to the emergency room for abdominal pain  over the past year or two.  He presents now with refractory abdominal  pain, nausea, vomiting for several days to a week or two.  He is unable  to keep most food down.  He has been eating noodles when he can and  skipping meals not infrequently.  He has not been able to keep down his  Prilosec for the last two over three nights.  He says that he was  diagnosed by Dr. Deatra Ina, the gastroenterologist, with inflammation of  his esophagus sometime in March of last year with an endoscopy.  I do  not have these records.  The patient has been in the emergency room  three times; this is the third visit in 2 [place] with similar pain.  He  has been vomiting but no hematemesis, no diarrhea, no fever, no bright  red blood per rectum or melena.   PAST MEDICAL HISTORY:  1. History of GERD with esophagitis.  2. ESRD as above.  3. Hypertension.  4. Obesity.   SOCIAL HISTORY:  No alcohol, drug or tobacco use.   MEDICATIONS:  1. Prilosec 20 a day.  2. Lipitor 20 nightly.  3. PhosLo 667 five with meals 3 times a day.  4. Lisinopril 40 nightly.  5. Nephro-Vite one a day.  6. InFeD 50 with dialysis every Wednesday.  7. Hectorol 4 mcg  each dialysis.  8. Tylenol.  9. Tramadol.  10.Clonazepam.  11.Naprosyn.  12.Robaxin.   Dry weight is 128. Dialysis time is 4 hours 45 minutes.   REVIEW OF SYSTEMS:  GENERAL:  Denies fever, chills, sweats, hearing  loss, visual changes, or sore throat.  No difficulty swallowing.  He  does have odynophagia for liquids and solids.  GI: As above.  GU: No  difficulty voiding. Makes small amounts of urine.  MUSCULOSKELETAL: No  myalgia, arthralgia or ankle swelling.  NEUROLOGIC: No focal numbness or  weakness.  ENDOCRINE:  No heat or cold intolerance.   PHYSICAL EXAMINATION:  VITAL SIGNS:  Blood pressure 140/70, heart rate  90, respirations 16, temperature 98.  GENERAL:  He is an alert, obese, young African-American male in no  distress.  He is in pain.  He is not toxic in appearance.  SKIN:  Warm and dry.  HEENT: PERRLA.  EOMI.  Throat  is clear.  NECK: Supple with flat neck veins.  CHEST: Clear throughout.  CARDIAC: Regular rate and rhythm without murmur or gallop.  ABDOMEN:  Obese.  He has mild epigastric tenderness.  No rebound or  other peritoneal sign.  He does have active bowel sounds.  RECTAL:  Exam was not performed.  EXTREMITIES:  Show no ankle edema.  He has a graft in his right forearm  which has a good bruit.  NEUROLOGIC:  Grossly nonfocal exam with no asterixis.  He is ambulatory.   LABORATORY DATA:  Potassium 3.3, BUN 46, albumin 3.8.  Liver enzymes  normal.  White blood count 8000, hemoglobin 13, platelets normal.   IMPRESSION:  1. Refractory abdominal pain, nausea, vomiting. Suspect significant      reflux problems with his symptoms of odynophagia particularly.  He      has a lot of vomiting.  He has worsening pain with belching or      hiccups.  He has been unable to keep his Prilosec down, and      certainly this is aggravating the situation.  He has been here in      the emergency room several times in the last 2 [place] and needs to      be admitted for  symptomatic control and to get him back on his home      medication regimen.  It appears that he may still be taking a      nonsteroidal because he was told to stop taking his Aleve, but he      now has Naprosyn on his list.  2. End-stage renal disease.  He missed dialysis today.  Will dialyze      him tonight in the hospital.  3. Hypertension.  4. Obesity.   PLAN:  Admit, pain control, q.12 h intravenous proton pump inhibitor,  antiemetics. Consider GI reevaluation.      Sol Blazing, M.D.  Electronically Signed     RDS/MEDQ  D:  11/05/2006  T:  11/06/2006  Job:  [id number]

## 2010-12-19 NOTE — Discharge Summary (Signed)
[name], [name]                ACCOUNT NO.:  [id number]   MEDICAL RECORD NO.:  [id number]          PATIENT TYPE:  INP   LOCATION:  5523                         FACILITY:  [facility]   PHYSICIAN:  Sherril Croon, M.D.   DATE OF BIRTH:  Sep 25, 1985   DATE OF ADMISSION:  12/01/2005  DATE OF DISCHARGE:  12/05/2005                                 DISCHARGE SUMMARY   ADMITTING DIAGNOSES:  1.  Abdominal pain.  2.  End-stage renal disease on chronic hemodialysis.  3.  Anemia of chronic disease.  4.  Secondary hyperparathyroidism.  5.  Hypertension.   DISCHARGE DIAGNOSES:  1.  Pancreatitis.  2.  Ischemic right-sided colitis.  3.  Splenomegaly as seen on computed axial tomography scan.  4.  End-stage renal disease with widely patent left arm AV fistula according      to sonogram.  5.  Anemia of chronic disease.  6.  Secondary hyperparathyroidism.  7.  Hypertension.   HISTORY OF PRESENT ILLNESS:  A 25 year old, black male with end-stage renal  disease secondary to focal segmental glomerulosclerosis admitted April 26,  through April 27, with acute pancreatitis with amylase 653, lipase 266.  Since discharge, he has had persistent abdominal pain, intermittent gastric  pain, nausea, vomiting, decreased appetite with a CT that was negative on  April 22.  HIDA scan on May 1, was also negative.  He denies fever or  chills.  He states his pain radiates to the right and left flanks and to the  back.  His bowels were open today and yesterday unremarkable with no coffee-  grounds emesis.   LABORATORY DATA AND X-RAY FINDINGS:  On admission, sodium 132, potassium  3.2, CO2 30, BUN 34 creatinine 15.6, glucose 99.  Alk phos 59, SGOT 8, SGPT  11, albumin 4.5, calcium 9.9.  Hemoglobin 13.5, white count 11.2, platelets  215,000.  Lipase 78.   CT on admission shows stable splenomegaly, chronic wall thickening,  suggestive inflammatory infectious colitis, inflammation involving the cecum  and ascending  colon, no significant findings involving the sigmoid colon  other than stable diverticulosis.  No pelvic masses or adenopathy.   HOSPITAL COURSE:  The patient was placed n.p.o. except for ice chips and  medications with water.  He was treated with Dilaudid and Zofran for his  symptoms.  IV Protonix was started and a GI consult was obtained.  We felt  that the acute pancreatitis and CT findings of the colon can be explained by  ischemia.  ACE inhibitor related angioedema was also a consideration and  recommended to stop it to which it was.  They doubt it was an infectious  process taking place.  Treatment consisted of stopping his lisinopril.  C.  difficile culture was obtained and was negative.  Over the next several  days, the patient's symptoms improved and his diet was advanced.  Colonoscopy was deferred due to his clinical improvement.  He was discharged  home eating a full diet, tolerating his medications and not requiring IV  pain medications.  He was instructed to take Tylenol for pain, to eat  small  portions 5 times a day and avoid fatty, greasy foods.  He will follow up  with Dr. Deatra Ina on May 29.   As stated, the patient had a fistulogram done of his left arm fistula  showing it to be widely patent.  This was done in response to complaints to  pain in the fistula site during dialysis.  No changes will take place.  We  will continue using the fistula.   SPECIAL INSTRUCTIONS:  Dialysis on Monday, Wednesday, Friday at the  [facility].  No change in dry weight.   DISCHARGE MEDICATIONS:  1.  Nephro-Vite one daily.  2.  Lipitor 20 mg daily.  3.  Prevacid 30 mg b.i.d.  4.  PhosLo 667 mg two with meals.  5.  EPO 4000 units IV each dialysis.  6.  He was told to stop his lisinopril.      Nonah Mattes, P.A.      Sherril Croon, M.D.  Electronically Signed    RRK/MEDQ  D:  01/15/2006  T:  01/15/2006  Job:  [id number]   cc:   [place]

## 2010-12-19 NOTE — H&P (Signed)
[name], KUMAGAI NO.:  [id number]   MEDICAL RECORD NO.:  [id number]          PATIENT TYPE:  INP   LOCATION:  1845                         FACILITY:  [facility]   PHYSICIAN:  Sherril Croon, M.D.   DATE OF BIRTH:  [birth year]   DATE OF ADMISSION:  12/01/2005  DATE OF DISCHARGE:                                HISTORY & PHYSICAL   This 25 year old African American male, end-stage renal disease secondary to  secondary to focal segmental glomerular sclerosis.  He was admitted on November 26, 2005, and discharged November 27, 2005, with acute pancreatitis.  Amylase  of 653, lipase 266.  Since his discharge, he has had persistent abdominal  pain, intermittent epigastric pain, nausea, vomiting, decreased appetite  with a CT that was negative on November 26, 2005 and a HIDA scan negative on  Dec 01, 2005.  He denies fever or chills.  His pain radiates to the right and  left flanks and through to the back.  His bowels were open today and  yesterday unremarkable.  No coffee ground emesis.   PAST MEDICAL HISTORY:  1.  End-stage renal disease, Monday, Wednesday, Friday dialysis.  Focal      segmental glomerular sclerosis.  2.  Anemia.  3.  Morbid obesity.  4.  Hyperlipidemia.  5.  History of hyperparathyroidism.   MEDICATIONS:  1.  Prevacid 30 mg b.i.d.  2.  Lipitor 20 mg daily.  3.  Nephro-Vite one every day.  4.  PhosLo 1 gram t.i.d.  5.  Lisinopril 40 mg every day, q.h.s.  6.  Epogen IV 4,000 units every hemodialysis.  7.  Hectorol IV 5.5 mcg every hemodialysis.   ALLERGIES:  No known drug allergies.   SOCIAL HISTORY:  He lives in [place].  No alcohol or tobacco.   FAMILY HISTORY:  No end-stage renal disease.   REVIEW OF SYSTEMS:  Denies fatigue, fever, sweats, chills.  EYES:  Denies  visual complaints, loss of vision, or double vision.  No eye pain.  EARS/NOSE/MOUTH/THROAT:  No hearing loss, epistaxis, or sore throat.  CARDIOVASCULAR:  No chest pain, palpitations,  dyspnea, orthopnea, or  syncope.  RESPIRATORY:  No cough, wheezing, hemoptysis.  ABDOMEN:  As per  HPI.  UROGENITAL:  No urgency, frequency, dysuria.  No renal calculi.  MUSCULOSKELETAL:  No musculoskeletal pain, no gout, no NSAID use.  ENDOCRINE:  No diabetes, no thyroid disease.  DERMATOLOGIC:  No skin rash or  itching.  HEMATOLOGIC:  He has a history of anemia using Epogen.  No DVT or  cancer.   PHYSICAL EXAMINATION:  GENERAL:  Alert, comfortable gentleman lying in bed.  No obvious distress.  VITAL SIGNS:  Blood pressure 140/70, pulse is 80, temperature afebrile.  HEENT:  Normocephalic atraumatic.  Pupils round, equal, reactive.  Fundi  benign.  Ears ____________ normal.  Tongue coated.  Oropharynx posterior  pharyngeal roof clear.  Uremic fetor.  NECK:  Supple.  No JVD.  No carotid bruits.  No thyromegaly.  CARDIOVASCULAR:  Regular rate and rhythm.  No murmurs, rubs, or gallops.  RESPIRATORY:  Lungs are  clear to auscultation.  No wheezes or rales.  ABDOMEN:  Soft, epigastric discomfort.  No rebound.  No guarding.  Bowel  sounds are present.  RECTAL:  No blood in stools.  Soft stools on glove.  EXTREMITIES:  No cyanosis, clubbing, or edema.  Left A/V graft with thrill  and bruit.   LABORATORY:  Sodium 132, potassium 3.9, CO2 30, BUN 34, creatinine 15.6,  glucose 99, alkaline phosphatase 59, AST 8, ALT 11, albumin 4.5, calcium  9.9.  Hemoglobin 13.5, WBC 11.2, platelets 215.  Lipase 78.   X-ray discussed with Dr. Ardeen Garland, reviewed CT scan from last week which was  unremarkable.  HIDA scan from today and abdominal films from today as well  as chest x-ray which were unremarkable.  EKG was within normal range with no  ST segment changes.   ASSESSMENT/PLAN:  1.  Abdominal pain, resolving pancreatitis.  Intravenous Dilaudid,      intravenous Zofran.  Recheck amylase and lipase.  Recheck abdominal CT.      Possible pseudocyst formation.  Doubt another etiology but would      consider  appendicitis as well as diverticulitis in the list of      differential.  2.  End-stage renal disease with hemodialysis on Monday, Wednesday, and      Friday.  Call hemodialysis unit in the morning for orders.  3.  Anemia.  We will discontinue Epogen.  Check CBC in the morning due to      high hemoglobin.  4.  Secondary hyperparathyroidism.  I will check calcium, phosphorus.      Intravenous Hectorol.  5.  Hypertension.  Volume stable at present.      Sherril Croon, M.D.  Electronically Signed     MWW/MEDQ  D:  12/01/2005  T:  12/01/2005  Job:  [id number]

## 2010-12-19 NOTE — Op Note (Signed)
[name], [name]                ACCOUNT NO.:  [id number]   MEDICAL RECORD NO.:  [id number]          PATIENT TYPE:  INP   LOCATION:  5742                         FACILITY:  [facility]   PHYSICIAN:  Sherril Croon, M.D.   DATE OF BIRTH:  08-03-1986   DATE OF PROCEDURE:  05/20/2004  DATE OF DISCHARGE:                                 OPERATIVE REPORT   PROCEDURE PERFORMED:  Insertion of right 16 cm hemodialysis catheter for  ultrafiltration and dialysis.   DESCRIPTION OF PROCEDURE:  The patient was consented for the procedure and  it was explained to him.  He was laid supine on the bed and placed in  Trendelenburg position.  The anterior triangle of the neck was delineated  using ultrasonography.   The area was prepped and draped in the usual sterile fashion and 5 to 10 mL  of lidocaine was used to infiltrate the site.  Using direct ultrasound  guidance.  An 18 gauge hollow bore needle was directly inserted using a  guide into the internal jugular vein.  Flash back of venous blood was noted  in a 5 to 10 mL syringe.  The syringe was removed and a flexible guidewire  advanced directly through the hollow bore needle into the internal jugular  vein.   The hollow bore needle was removed and using the Seldinger technique, a  dilator was introduced over the guidewire.  Using a combination of blunt and  sharp dissection, the subcutaneous tissues were dissected away from the  entrance of the guidewire.  The dilator was removed and a 16 cm catheter was  introduced over the guidewire.  A flash back of venous blood was noted in  the hub of the dialysis catheter.  The patient tolerated the procedure well.   A chest x-ray was obtained which confirmed placement.  No complications of  the procedure were noted.       MWW/MEDQ  D:  05/20/2004  T:  05/20/2004  Job:  [id number]

## 2010-12-19 NOTE — Discharge Summary (Signed)
[name], [name]                ACCOUNT NO.:  [id number]   MEDICAL RECORD NO.:  [id number]          PATIENT TYPE:  INP   LOCATION:  [id number]                         FACILITY:  [facility]   PHYSICIAN:  Yong Channel, MD        DATE OF BIRTH:  1986/06/18   DATE OF ADMISSION:  11/26/2005  DATE OF DISCHARGE:  11/27/2005                                 DISCHARGE SUMMARY   DISCHARGE DIAGNOSES:  1.  Acute gastritis/abdominal pain.  2.  End stage renal disease on hemodialysis.  3.  Hyperparathyroidism.  4.  Anemia of chronic disease.  5.  Dyslipidemia.   DISCHARGE MEDICATIONS:  1.  __________chloride spray pre-dialysis p.r.n.  2.  Quinine sulfate 325 mg p.o. b.i.d. p.r.n.  3.  Sorbitol 60 mg q.4 h p.r.n. constipation.  4.  Percocet 5/325 1-2 tablets q.6 h p.r.n. pain.  5.  Lisinopril 40 mg p.o. q.h.s.  6.  Fosrenol 1 g p.o. with meals t.i.d.  7.  TUMS three tablets q.a.c.  8.  Benadryl 25 mg p.o. t.i.d. p.r.n.  9.  Nephro-Vite one tablet daily.  10. Lipitor 20 mg p.o. daily.  11. Prevacid 30 mg p.o. b.i.d.  12. Phenergan 25 mg p.o. q.h.s. p.r.n.  13. Colace 200 mg q.h.s.  14. Glycolax suppositories one q.12 h p.r.n.  15. Epogen 12,000 units IV q.dialysis.  16. Hectorol 5.5 mcg IV q.dialysis.   HISTORY OF PRESENT ILLNESS:  Please refer to HPI of admission as well as  admission labs.   HOSPITAL COURSE:  #1 - ACUTE GASTRITIS/ABDOMINAL PAIN.  The patient was  admitted and treated n.p.o. with IV pain medications as well as Protonix.  The patient remained without vomiting, without pain, and with hemoglobin  stable.  CT scan of the abdomen with IV contrast was done that did not show  any acute abnormality.  With normal pancreas and normal appendix, normal  gallbladder.  On further interrogation, again, we asked about the alcohol  consumption on this patient, and even though the patient was not explicit  with thinking that this was the cause of his vomiting and abdominal pain.  To further rule  out any gallbladder disease, he is going to get HIDA scan  with EF as an outpatient on Dec 03, 2005, and this needs to be followed up as  an outpatient.  The patient, at the time of his discharge, is completely  asymptomatic, tolerating well p.o.'s, and he has never presented any  hematemesis during this admission, and hemoglobin again has been stable.   #2 - END STAGE RENAL DISEASE.  The patient is going to be dialyzed today and  he can go home after dialysis.   #3 - HYPERPARATHYROIDISM.  He is on Hectorol __________ his anemia.  He is  on Epogen.   #4 -DYSLIPIDEMIA.  Today, we drew a fasting lipid panel.  This needs to be  followed up as an outpatient.   DISCHARGE LABORATORIES:  White blood cell count 8.8, hemoglobin 12.4,  hematocrit 35.0, platelets 199,000.  Sodium 135, potassium 4.0, chloride 84,  CO2 27, glucose 79, BUN  96, creatinine 23.3, bilirubin total 1.2, alk-phos  60, AST 6, ALT 11, total protein 7.1, albumin 4.0, calcium 9.0.  Phosphorous  11.1.   DISPOSITION:  The patient is to go home with hemodialysis on next Monday.  He has already had a scan with ejection fraction on Dec 03, 2005.   PROCEDURES:  During this hospitalization, hemodialysis on April 27, CT of  the abdomen and pelvis on April 26.      Yong Channel, MD     YC/MEDQ  D:  11/27/2005  T:  11/27/2005  Job:  [id number]   cc:   Jeneen Rinks L. Deterding, M.D.  Fax: [phone]

## 2010-12-19 NOTE — Op Note (Signed)
NAMEVARTAN, HAUSSER                ACCOUNT NO.:  [id number]   MEDICAL RECORD NO.:  [id number]          PATIENT TYPE:  INP   LOCATION:  5527                         FACILITY:  [facility]   PHYSICIAN:  Jessy Oto. Fields, MD  DATE OF BIRTH:  August 16, 1985   DATE OF PROCEDURE:  05/22/2004  DATE OF DISCHARGE:                                 OPERATIVE REPORT   PREOPERATIVE DIAGNOSIS:  End-stage renal disease.   POSTOPERATIVE DIAGNOSIS:  End-stage renal disease.   PROCEDURES:  1.  Insertion of right internal jugular vein Diatek catheter.  2.  Right radiocephalic arteriovenous fistula.   ANESTHESIA:  Local with IV sedation.   ASSISTANT:  Jacinta Shoe, P.A.   OPERATIVE FINDINGS:  1.  A 3.5 mm cephalic vein.  2.  A 2 mm radial artery, end-to-side anastomosis.  3.  A 19 cm Diatek catheter.   OPERATIVE DETAILS:  After obtaining informed consent, the patient was taken  to the operating room.  The patient was placed in the supine position on the  operating table.  After adequate sedation, the patient's right neck and  chest were prepped and draped in the usual sterile fashion.  A pre-existing  Flexicon catheter was prepped into the operative field.  This was in the  right internal jugular vein.  Next, the hub of the catheter was transected  with scissors and a 0.035 inch J-tip guidewire threaded through the Flexion  catheter down into the right atrium under fluoroscopic guidance.  The  Flexion catheter was removed, and the wire was advanced down into the  inferior vena cava.  Next, sequential 12, 14, and 16 French dilators were  placed over the guidewire into the right atrium.  A 16 French dilator with  peel-away sheath was placed in the right atrium.  The dilator was removed as  well as the guidewire and a 19 cm Diatek catheter threaded down into the  right atrium under fluoroscopic guidance.  The peel-away sheath was then  removed.  The catheter was then tunneled out subcutaneously, cut  to length,  and the hub attached.  The catheter was noted to flush and draw easily.  The  catheter was inspected under fluoroscopy from its tip to its hub and found  to be without kinks throughout its course.  The tip was in the right atrium.  Next, the catheter was sutured to the skin with nylon sutures and the neck  insertion site closed with a Vicryl stitch.  Next, attention was turned to  the right arm.  The patient's entire right upper extremity was prepped and  draped in the usual sterile fashion.  Local anesthesia was infiltrated  midway between the distance of the cephalic vein and the radial artery on  the right arm.  A longitudinal incision was made in this location, the  cephalic vein was dissected free along the dorsal aspect of the wrist.  This  was of good quality, approximately 3.5 mm in diameter.  This was mobilized  for approximately 4 cm.  Attention was then turned toward dissection of the  radial artery in  the medial aspect of the field.  The radial artery was  small, approximately 2 mm in diameter.  There was excellent flow.  The  radial artery was dissected free and controlled proximally and distally with  vessel loops.  The patient was given 3000 units of intravenous heparin.  The  distal cephalic vein was ligated with a 3-0 silk tie, transected, and  mobilized over toward the level of the radial artery.  A longitudinal  arteriotomy was made and the vein sewn end of vein to side of artery using a  running 7-0 Prolene suture.  Just prior to completion of the anastomosis,  the artery was forebled, backbled, and thoroughly flushed.  The vein was  also backbled and flushed.  The anastomosis was secured and hemostasis was  obtained.  There was a palpable thrill in the fistula immediately.  Hemostasis was obtained.  The subcutaneous tissue was reapproximated using  running 3-0 Vicryl suture.  The skin was then closed with a 4-0 Vicryl  subcuticular stitch.  Benzoin and  Steri-Strips were applied to the wound.  The patient tolerated the procedure well, and there were no complications.  Instrument, sponge, and needle count was correct at the end of the case.  The patient was taken to the recovery room in stable condition.       CEF/MEDQ  D:  05/22/2004  T:  05/22/2004  Job:  [id number]

## 2010-12-19 NOTE — Discharge Summary (Signed)
NAMEDESTINY, SCHMUCK                ACCOUNT NO.:  [id number]   MEDICAL RECORD NO.:  [id number]          PATIENT TYPE:  INP   LOCATION:  5502                         FACILITY:  [facility]   PHYSICIAN:  Louis Meckel, M.D.DATE OF BIRTH:  1986-04-11   DATE OF ADMISSION:  06/23/2004  DATE OF DISCHARGE:  06/25/2004                                 DISCHARGE SUMMARY   ADMITTING DIAGNOSES:  Abdominal pain.   DISCHARGE DIAGNOSES:  1.  Abdominal pain, resolved.  2.  End-stage renal disease.  Hemodialysis Monday, Wednesday, Friday.  3.  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  4.  Anemia of chronic disease.  5.  Morbid obesity.  6.  Hyperparathyroidism.  7.  Hyperlipidemia.   CONSULTS:  Surgical consult with Dr. Sammuel Hines. Marlou Starks, III.   PROCEDURES:  1.  Abdominal CT November 22 showing no acute findings within the abdomen.  2.  Pelvic CT November 22 showing mild diverticulosis, no acute findings.  3.  Acute abdominal series November 21 showing no evidence of acute      cardiopulmonary disease or bowel obstruction.   HISTORY AND PHYSICAL EXAMINATION:  Please refer to previously dictated  history and physical available on e-chart for details of patient's condition  upon admission as well as the admitting laboratory findings.   HOSPITAL COURSE:  #1 - ABDOMINAL PAIN:  Patient had a negative work-up  including acute abdominal series, CT of the abdomen, CT of the pelvis.  Patient's laboratory data including liver enzymes as well as amylase and  lipase were all within normal range.  Patient had a surgical consult with  Dr. Marlou Starks and at that point was ruled out for appendicitis or any acute  abdominal etiology for his abdominal pain.  Patient was initially made  n.p.o. and monitored.  On the second day of hospitalization his pain had  essentially resolved.  He was started on a clear liquid renal diet and  advanced as tolerated.  This morning patient has tolerated all  of his meals.  He has had no more episodes of nausea and vomiting and he has had one bowel  movement while hospitalized.  His abdominal pain has subsided and is deemed  stable for discharge.   #2 - END-STAGE RENAL DISEASE:  Patient is on hemodialysis Monday, Wednesday  Friday at the [facility] location.  Patient did have hemodialysis  Wednesday, June 25, 2004 records of which will be faxed to his dialysis  center.  Of note, patient's estimated dry weight is now 115 kg.  Otherwise,  no changes have been made to his hemodialysis regimen.  Regarding access, we  used his left subclavian Diatek catheter as his fistula has not yet matured.   #3 - HYPERCHOLESTEROLEMIA:  This is currently stable.  Patient was continued  on his Lipitor.  Patient reports his Lipitor dose of 40 mg daily, although  records have indicated to be 10 mg daily.  We have discharged the patient on  Lipitor 40 mg daily.   #4 - HYPERTENSION:  Patient's blood pressure remained stable during this  hospitalization.  He was  maintained on his outpatient medications which was  Altace at 2.5 mg daily.   #5 - GASTROESOPHAGEAL REFLUX DISEASE:  Patient was on Prevacid as an  outpatient.  He was continued on Protonix while hospitalized and as his pain  has resolved we will discharge him to his usual home medication.   CONDITION ON DISCHARGE:  Good.   DISPOSITION:  The patient is discharged to home.   DISCHARGE MEDICATIONS:  1.  Altace 2.5 mg daily.  2.  Lipitor 40 mg daily.  3.  Cyclosporin 150 mg daily.  4.  Tums EX one with each meal.  5.  Prevacid 30 mg daily.  6.  Epogen 25,000 units IV three times weekly with hemodialysis.  7.  InFeD 100 mg IV each Wednesday with hemodialysis.  8.  Diphenhydramine 25 mg as needed with dialysis.   DISCHARGE INSTRUCTIONS:  The patient has no activity restrictions.  He is to  maintain a renal diet.   DISCHARGE LABORATORIES:  Sodium 139, potassium 3.8, chloride 102,  bicarbonate  30, glucose 85, BUN 13, creatinine 5.9, albumin 1.1, calcium  8.2, phosphorous 4.9.  WBC 10.3, hemoglobin 12.6, hematocrit 37.7, platelets  347.  Urine drug screen was negative.  Blood cultures at the time of  dictation which were drawn on admission have continued to be negative.   FOLLOWUP:  Patient is to follow up at the [facility]  Friday, November 25 for his routine rescheduled hemodialysis.       IO/MEDQ  D:  06/25/2004  T:  06/25/2004  Job:  [id number]   cc:   Sammuel Hines. Daiva Nakayama, M.D.  [id number]  [place]  [place] [phone]  Fax: [phone]

## 2010-12-19 NOTE — Discharge Summary (Signed)
[name], [name]                ACCOUNT NO.:  [id number]   MEDICAL RECORD NO.:  [id number]          PATIENT TYPE:  INP   LOCATION:  5524                         FACILITY:  [facility]   PHYSICIAN:  Sol Blazing, M.D.DATE OF BIRTH:  1985-11-14   DATE OF ADMISSION:  11/05/2006  DATE OF DISCHARGE:  11/06/2006                               DISCHARGE SUMMARY   DISCHARGE DIAGNOSES:  1. Abdominal pain of unknown etiology.  2. Chronic end-stage renal disease on hemodialysis, Monday-Wednesday-      Friday.  3. Hypertension.  4. Obesity.  5. Hyperlipidemia.  6. Hyperparathyroidism.   DISCHARGE MEDICATIONS:  1. Lipitor 20 mg one tab at bedtime.  2. Lisinopril 40 mg by mouth at bedtime.  3. PhosLo 667 mg five pills before each meal three times a day.  4. Nephrovite one pill by mouth daily.  5. InFeD 50 mg IV every Wednesday.  6. Hectorol 4 mcg IV every dialysis.  7. Prilosec 40 mg twice a day.  8. Tylenol 650 mg every four hours as needed.   DISCHARGE DIET:  Renal.   DISCHARGE ACTIVITY:  As tolerated.   FOLLOWUP APPOINTMENT:  The patient will be seen at the hemodialysis unit  as scheduled.  The patient was instructed to be sure to keep his  appointment with [facility] in April 2008.   IMPORTANT LABORATORY RESULTS:  Lipase is negative.  Liver function tests  are normal.  Creatinine 12.95, BUN 46, bicarb 18, potassium 3.3.  Hemoglobin 13.7, white count 8.5, platelets 200.  ANC 6.2.   STUDIES:  Acute abdomen series was negative.   HOSPITAL COURSE:  A 25 year old African-American male with end-stage  renal disease on hemodialysis presented to the emergency department with  acute abdominal pain.  He received several doses of Dilaudid during his  hospital course.  This improved overnight.  His proton pump inhibitor  was increased to twice daily before discharge.  He was also sent out on  Tylenol 650 mg every four hours as needed.  By the day of discharge he  was eating normally without  vomiting, et Ronney Asters.  His abdominal pain had  improved.  Of note, he had a positive gastric occult blood back on October 30, 2006.  He was to follow up with [facility] gastroenterologist in April.   HEMODIALYSIS ORDERS:  [place]ath is 2K 800/400.  Heparin standard.  Epogen is  3000 units three times a week.  Duration is four hours and 45 minutes.  Estimated dry weight is 128 kg.  Access is right AV graft.  Iron is  InFeD 50 mg IV every Wednesday.  Hemoglobin 13.0.  Potassium 3.3.      Druscilla Brownie, M.D.      Sol Blazing, M.D.  Electronically Signed    AL/MEDQ  D:  11/06/2006  T:  11/07/2006  Job:  [id number]

## 2011-01-16 ENCOUNTER — Emergency Department (HOSPITAL_COMMUNITY): Payer: Medicare Other

## 2011-01-16 ENCOUNTER — Emergency Department (HOSPITAL_COMMUNITY)
Admission: EM | Admit: 2011-01-16 | Discharge: 2011-01-16 | Disposition: A | Discharge status: Home / Self Care | Payer: Medicare Other | Attending: Emergency Medicine | Admitting: Emergency Medicine

## 2011-01-16 DIAGNOSIS — K6289 Other specified diseases of anus and rectum: Secondary | ICD-10-CM | POA: Insufficient documentation

## 2011-01-16 DIAGNOSIS — R197 Diarrhea, unspecified: Secondary | ICD-10-CM | POA: Insufficient documentation

## 2011-01-16 DIAGNOSIS — R109 Unspecified abdominal pain: Secondary | ICD-10-CM | POA: Insufficient documentation

## 2011-01-16 DIAGNOSIS — Z79899 Other long term (current) drug therapy: Secondary | ICD-10-CM | POA: Insufficient documentation

## 2011-01-16 LAB — CBC
Hemoglobin: 11 g/dL — ABNORMAL LOW (ref 13.0–17.0)
MCH: 30.8 pg (ref 26.0–34.0)
MCHC: 35.3 g/dL (ref 30.0–36.0)
MCV: 87.4 fL (ref 78.0–100.0)
Platelets: 161 10*3/uL (ref 150–400)

## 2011-01-16 LAB — COMPREHENSIVE METABOLIC PANEL
ALT: 31 U/L (ref 0–53)
Calcium: 10 mg/dL (ref 8.4–10.5)
Creatinine, Ser: 12.55 mg/dL — ABNORMAL HIGH (ref 0.50–1.35)
GFR calc Af Amer: 6 mL/min — ABNORMAL LOW (ref >60)
Glucose, Bld: 94 mg/dL (ref 70–99)
Sodium: 139 mEq/L (ref 135–145)
Total Protein: 7 g/dL (ref 6.0–8.3)

## 2011-01-16 LAB — DIFFERENTIAL
Basophils Relative: 0 % (ref 0–1)
Eosinophils Absolute: 0.1 10*3/uL (ref 0.0–0.7)
Monocytes Absolute: 0.9 10*3/uL (ref 0.1–1.0)
Monocytes Relative: 9 % (ref 3–12)

## 2011-04-30 LAB — POCT I-STAT, CHEM 8
Calcium, Ion: 1.09 — ABNORMAL LOW
Creatinine, Ser: 9.2 — ABNORMAL HIGH
Glucose, Bld: 91
Hemoglobin: 11.9 — ABNORMAL LOW
Potassium: 3.8
TCO2: 30

## 2011-04-30 LAB — INFLUENZA A+B VIRUS AG-DIRECT(RAPID)
Inflenza A Ag: NEGATIVE
Influenza B Ag: NEGATIVE

## 2011-04-30 LAB — POCT CARDIAC MARKERS: CKMB, poc: 1.7

## 2011-05-05 LAB — OCCULT BLOOD X 1 CARD TO LAB, STOOL: Fecal Occult Bld: POSITIVE

## 2011-10-20 ENCOUNTER — Other Ambulatory Visit: Payer: Self-pay | Admitting: *Deleted

## 2011-10-20 ENCOUNTER — Other Ambulatory Visit (HOSPITAL_COMMUNITY): Payer: Self-pay | Admitting: Nephrology

## 2011-10-20 ENCOUNTER — Encounter (HOSPITAL_COMMUNITY): Payer: Self-pay | Admitting: Pharmacy Technician

## 2011-10-20 DIAGNOSIS — N186 End stage renal disease: Secondary | ICD-10-CM

## 2011-10-22 ENCOUNTER — Ambulatory Visit (HOSPITAL_COMMUNITY)
Admission: RE | Admit: 2011-10-22 | Discharge: 2011-10-22 | Disposition: A | Discharge status: Home / Self Care | Payer: Medicare Other | Source: Ambulatory Visit | Attending: Nephrology | Admitting: Nephrology

## 2011-10-22 DIAGNOSIS — T888XXA Other specified complications of surgical and medical care, not elsewhere classified, initial encounter: Secondary | ICD-10-CM | POA: Insufficient documentation

## 2011-10-22 DIAGNOSIS — Y841 Kidney dialysis as the cause of abnormal reaction of the patient, or of later complication, without mention of misadventure at the time of the procedure: Secondary | ICD-10-CM | POA: Insufficient documentation

## 2011-10-22 DIAGNOSIS — N186 End stage renal disease: Secondary | ICD-10-CM

## 2011-10-22 MED ORDER — IOHEXOL 300 MG/ML  SOLN
55.0000 mL | Freq: Once | INTRAMUSCULAR | Status: AC | PRN
  Administered 2011-10-22: 55 mL via INTRAVENOUS

## 2011-10-22 NOTE — Procedures (Signed)
Fistulogram demonstrates normal function.  No intervention performed.

## 2011-10-27 ENCOUNTER — Ambulatory Visit (HOSPITAL_COMMUNITY): Admit: 2011-10-27 | Payer: Medicare Other | Admitting: Surgery

## 2011-10-27 ENCOUNTER — Encounter (HOSPITAL_COMMUNITY): Payer: Self-pay

## 2011-10-27 SURGERY — ASSESSMENT, SHUNT FUNCTION, WITH CONTRAST RADIOGRAPHIC STUDY
Anesthesia: LOCAL

## 2012-04-06 ENCOUNTER — Encounter (HOSPITAL_COMMUNITY): Payer: Self-pay

## 2012-04-06 ENCOUNTER — Emergency Department (HOSPITAL_COMMUNITY)
Admission: EM | Admit: 2012-04-06 | Discharge: 2012-04-06 | Disposition: A | Discharge status: Home / Self Care | Payer: Medicare Other | Attending: Emergency Medicine | Admitting: Emergency Medicine

## 2012-04-06 ENCOUNTER — Emergency Department (HOSPITAL_COMMUNITY): Payer: Medicare Other

## 2012-04-06 DIAGNOSIS — N19 Unspecified kidney failure: Secondary | ICD-10-CM | POA: Insufficient documentation

## 2012-04-06 DIAGNOSIS — F17200 Nicotine dependence, unspecified, uncomplicated: Secondary | ICD-10-CM | POA: Insufficient documentation

## 2012-04-06 DIAGNOSIS — R109 Unspecified abdominal pain: Secondary | ICD-10-CM

## 2012-04-06 LAB — CBC WITH DIFFERENTIAL/PLATELET
Basophils Absolute: 0 10*3/uL (ref 0.0–0.1)
Basophils Relative: 0 % (ref 0–1)
Eosinophils Absolute: 0.2 10*3/uL (ref 0.0–0.7)
Eosinophils Relative: 2 % (ref 0–5)
HCT: 29.5 % — ABNORMAL LOW (ref 39.0–52.0)
Hemoglobin: 10.5 g/dL — ABNORMAL LOW (ref 13.0–17.0)
Lymphocytes Relative: 37 % (ref 12–46)
Lymphs Abs: 3.2 10*3/uL (ref 0.7–4.0)
MCH: 30.8 pg (ref 26.0–34.0)
MCHC: 35.6 g/dL (ref 30.0–36.0)
MCV: 86.5 fL (ref 78.0–100.0)
Monocytes Absolute: 0.6 10*3/uL (ref 0.1–1.0)
Monocytes Relative: 7 % (ref 3–12)
Neutro Abs: 4.7 10*3/uL (ref 1.7–7.7)
Neutrophils Relative %: 55 % (ref 43–77)
Platelets: 188 10*3/uL (ref 150–400)
RBC: 3.41 MIL/uL — ABNORMAL LOW (ref 4.22–5.81)
RDW: 12.7 % (ref 11.5–15.5)
WBC: 8.5 10*3/uL (ref 4.0–10.5)

## 2012-04-06 LAB — COMPREHENSIVE METABOLIC PANEL
ALT: 12 U/L (ref 0–53)
AST: 8 U/L (ref 0–37)
Albumin: 3.8 g/dL (ref 3.5–5.2)
Alkaline Phosphatase: 55 U/L (ref 39–117)
BUN: 55 mg/dL — ABNORMAL HIGH (ref 6–23)
CO2: 23 mEq/L (ref 19–32)
Calcium: 10.4 mg/dL (ref 8.4–10.5)
Chloride: 96 mEq/L (ref 96–112)
Creatinine, Ser: 11.79 mg/dL — ABNORMAL HIGH (ref 0.50–1.35)
GFR calc Af Amer: 6 mL/min — ABNORMAL LOW (ref >90)
GFR calc non Af Amer: 5 mL/min — ABNORMAL LOW (ref >90)
Glucose, Bld: 88 mg/dL (ref 70–99)
Potassium: 3.6 mEq/L (ref 3.5–5.1)
Sodium: 138 mEq/L (ref 135–145)
Total Bilirubin: 0.4 mg/dL (ref 0.3–1.2)
Total Protein: 7.1 g/dL (ref 6.0–8.3)

## 2012-04-06 LAB — LIPASE, BLOOD: Lipase: 25 U/L (ref 11–59)

## 2012-04-06 MED ORDER — ONDANSETRON HCL 4 MG/2ML IJ SOLN
4.0000 mg | Freq: Once | INTRAMUSCULAR | Status: AC
  Administered 2012-04-06: 4 mg via INTRAVENOUS
  Filled 2012-04-06: qty 2

## 2012-04-06 MED ORDER — HYDROMORPHONE HCL PF 1 MG/ML IJ SOLN
1.0000 mg | Freq: Once | INTRAMUSCULAR | Status: AC
  Administered 2012-04-06: 1 mg via INTRAVENOUS
  Filled 2012-04-06: qty 1

## 2012-04-06 MED ORDER — PANTOPRAZOLE SODIUM 40 MG PO TBEC: 40.0000 mg | DELAYED_RELEASE_TABLET | Freq: Every day | ORAL | Status: DC

## 2012-04-06 MED ORDER — OXYCODONE-ACETAMINOPHEN 5-325 MG PO TABS: 1.0000 | ORAL_TABLET | ORAL | Status: AC | PRN

## 2012-04-06 NOTE — ED Notes (Signed)
Pt complains of abd pain over the weekend, and subsided on Monday yesterday began again and today was at dialysis today and got worse again and sent here.

## 2012-04-06 NOTE — ED Notes (Signed)
Requesting transportation voucher

## 2012-04-06 NOTE — ED Provider Notes (Signed)
History    26yM with abdominal pain. Onset this weekend. Initially intermittent but has been more or less constant for the past day. Today patient was in dialysis and the pain does start becoming worse and was referred to the ED. Pain is the upper abdomen, slightly worse on the right side. Achy to sharp in nature. No radiation. No appreciable exacerbating relieving factors. No nausea or vomiting. Does not make urine.  CSN: [id number]  Arrival date & time 04/06/12  1313   First MD Initiated Contact with Patient 04/06/12 1827      Chief Complaint  Patient presents with  . Abdominal Pain    (Consider location/radiation/quality/duration/timing/severity/associated sxs/prior treatment) HPI  Past Medical History  Diagnosis Date  . Renal disorder     No past surgical history on file.  No family history on file.  History  Substance Use Topics  . Smoking status: Current Everyday Smoker  . Smokeless tobacco: Not on file  . Alcohol Use: No      Review of Systems   Review of symptoms negative unless otherwise noted in HPI.   Allergies  Review of patient's allergies indicates no known allergies.  Home Medications   Current Outpatient Rx  Name Route Sig Dispense Refill  . ACETAMINOPHEN 500 MG PO TABS Oral Take 1,000 mg by mouth every 6 (six) hours as needed. For pain    . CINACALCET HCL 90 MG PO TABS Oral Take 90 mg by mouth daily.    Marland Kitchen LANTHANUM CARBONATE 500 MG PO CHEW Oral Chew 1,000-1,500 mg by mouth 3 (three) times daily with meals. 3 tabs with each meal and 2 tabs with snacks      BP 116/60  Pulse 66  Temp 98.2 F (36.8 C) (Oral)  Resp 16  SpO2 100%  Physical Exam  Nursing note and vitals reviewed. Constitutional: He appears well-developed and well-nourished. No distress.  HENT:  Head: Normocephalic and atraumatic.  Eyes: Conjunctivae are normal. Right eye exhibits no discharge. Left eye exhibits no discharge.  Neck: Neck supple.  Cardiovascular: Normal rate,  regular rhythm and normal heart sounds.  Exam reveals no gallop and no friction rub.   No murmur heard. Pulmonary/Chest: Effort normal and breath sounds normal. No respiratory distress.  Abdominal: Soft. He exhibits no distension and no mass. There is tenderness. There is no rebound and no guarding.       Mild tenderness in the epigastrium and right upper quadrant.  Musculoskeletal: He exhibits no edema and no tenderness.       No CVA tenderness  Neurological: He is alert.  Skin: Skin is warm and dry.  Psychiatric: He has a normal mood and affect. His behavior is normal. Thought content normal.    ED Course  Procedures (including critical care time)  Labs Reviewed  CBC WITH DIFFERENTIAL - Abnormal; Notable for the following:    RBC 3.41 (*)     Hemoglobin 10.5 (*)     HCT 29.5 (*)     All other components within normal limits  COMPREHENSIVE METABOLIC PANEL - Abnormal; Notable for the following:    BUN 55 (*)     Creatinine, Ser 11.79 (*)     GFR calc non Af Amer 5 (*)     GFR calc Af Amer 6 (*)     All other components within normal limits   No results found.  US Abdomen Complete  04/06/2012  *RADIOLOGY REPORT*  Clinical Data:  Abdominal pain, renal failure on dialysis, past  history of pancreatitis  ULTRASOUND ABDOMEN:  Technique:  Sonography of upper abdominal structures was performed.  Comparison:  02/07/2008 Correlation:  CT abdomen 04/09/2010  Gallbladder:  Normally distended without stones or wall thickening. No pericholecystic fluid or sonographic Murphy sign.  Common bile duct:  Normal caliber 5 mm diameter  Liver:  Normal appearance  IVC:  Normal appearance  Pancreas:  Majority pancreas obscured by bowel gas.  A small portion of the pancreatic body and proximal tail is normal appearance.  Spleen:  Normal appearance, 10.1 cm length  Right kidney:  10.4 cm length.  Markedly increased renal cortical echogenicity.  Small hypoechoic nodule lower pole 1.7 x 1.3 cm diameter, consistent  with small renal cyst, smaller than the 2.7 cm measured on prior CT. No hydronephrosis.  Left kidney:  8.7 cm length.  Echogenic renal cortex.  Poorly visualized due to increased echogenicity.  Peripelvic cyst 2.5 x 2.2 x 1.8 cm diameter. No hydronephrosis.  Aorta:  Normal caliber  Other:  No free fluid  IMPRESSION: End-stage renal disease with small bilateral renal cysts. Incomplete pancreatic visualization. No definite acute upper abdominal sonographic abnormalities.   Original Report Authenticated By: Burnetta Sabin, M.D.     1. Abdominal pain       MDM  26yM with abdominal pain.  Some mild tenderness in the right upper quadrant epigastrium without rebound. Workup fairly unremarkable aside from expected abnormalities in setting of chronic renal failure. Imaging negative for acute abnormality. Very low suspicion for acute emergent surgical process. Patient feeling better after medications. Plan continue symptomatic treatment. Return precautions were discussed. Outpatient followup as        Virgel Manifold, MD 04/12/12 2207

## 2012-04-06 NOTE — ED Notes (Signed)
Patient transported to Ultrasound 

## 2012-07-17 ENCOUNTER — Encounter (HOSPITAL_COMMUNITY): Payer: Self-pay | Admitting: *Deleted

## 2012-07-17 DIAGNOSIS — R209 Unspecified disturbances of skin sensation: Secondary | ICD-10-CM | POA: Insufficient documentation

## 2012-07-17 DIAGNOSIS — Z8669 Personal history of other diseases of the nervous system and sense organs: Secondary | ICD-10-CM | POA: Insufficient documentation

## 2012-07-17 DIAGNOSIS — Z79899 Other long term (current) drug therapy: Secondary | ICD-10-CM | POA: Insufficient documentation

## 2012-07-17 DIAGNOSIS — Z8639 Personal history of other endocrine, nutritional and metabolic disease: Secondary | ICD-10-CM | POA: Insufficient documentation

## 2012-07-17 DIAGNOSIS — M545 Low back pain, unspecified: Secondary | ICD-10-CM | POA: Insufficient documentation

## 2012-07-17 DIAGNOSIS — R42 Dizziness and giddiness: Secondary | ICD-10-CM | POA: Insufficient documentation

## 2012-07-17 DIAGNOSIS — F17200 Nicotine dependence, unspecified, uncomplicated: Secondary | ICD-10-CM | POA: Insufficient documentation

## 2012-07-17 DIAGNOSIS — Z862 Personal history of diseases of the blood and blood-forming organs and certain disorders involving the immune mechanism: Secondary | ICD-10-CM | POA: Insufficient documentation

## 2012-07-17 NOTE — ED Notes (Signed)
Pt reports bilateral hands and feet tingling since Friday.  States that he has also been dizzy.  Pt ambulatory without difficulty.

## 2012-07-18 ENCOUNTER — Encounter (HOSPITAL_COMMUNITY): Payer: Self-pay | Admitting: *Deleted

## 2012-07-18 ENCOUNTER — Emergency Department (HOSPITAL_COMMUNITY): Payer: Medicare Other

## 2012-07-18 ENCOUNTER — Emergency Department (HOSPITAL_COMMUNITY)
Admission: EM | Admit: 2012-07-18 | Discharge: 2012-07-19 | Disposition: A | Discharge status: Home / Self Care | Payer: Medicare Other | Attending: Emergency Medicine | Admitting: Emergency Medicine

## 2012-07-18 ENCOUNTER — Emergency Department (HOSPITAL_COMMUNITY)
Admission: EM | Admit: 2012-07-18 | Discharge: 2012-07-18 | Disposition: A | Discharge status: Home / Self Care | Payer: Medicare Other | Attending: Emergency Medicine | Admitting: Emergency Medicine

## 2012-07-18 ENCOUNTER — Encounter (HOSPITAL_COMMUNITY): Payer: Self-pay | Admitting: Emergency Medicine

## 2012-07-18 DIAGNOSIS — R748 Abnormal levels of other serum enzymes: Secondary | ICD-10-CM | POA: Insufficient documentation

## 2012-07-18 DIAGNOSIS — Z79899 Other long term (current) drug therapy: Secondary | ICD-10-CM | POA: Insufficient documentation

## 2012-07-18 DIAGNOSIS — Z862 Personal history of diseases of the blood and blood-forming organs and certain disorders involving the immune mechanism: Secondary | ICD-10-CM | POA: Insufficient documentation

## 2012-07-18 DIAGNOSIS — R079 Chest pain, unspecified: Secondary | ICD-10-CM

## 2012-07-18 DIAGNOSIS — E785 Hyperlipidemia, unspecified: Secondary | ICD-10-CM | POA: Insufficient documentation

## 2012-07-18 DIAGNOSIS — F17200 Nicotine dependence, unspecified, uncomplicated: Secondary | ICD-10-CM | POA: Insufficient documentation

## 2012-07-18 DIAGNOSIS — M545 Low back pain, unspecified: Secondary | ICD-10-CM

## 2012-07-18 DIAGNOSIS — R42 Dizziness and giddiness: Secondary | ICD-10-CM

## 2012-07-18 DIAGNOSIS — R05 Cough: Secondary | ICD-10-CM | POA: Insufficient documentation

## 2012-07-18 DIAGNOSIS — Z09 Encounter for follow-up examination after completed treatment for conditions other than malignant neoplasm: Secondary | ICD-10-CM | POA: Insufficient documentation

## 2012-07-18 DIAGNOSIS — R059 Cough, unspecified: Secondary | ICD-10-CM | POA: Insufficient documentation

## 2012-07-18 DIAGNOSIS — M549 Dorsalgia, unspecified: Secondary | ICD-10-CM | POA: Insufficient documentation

## 2012-07-18 DIAGNOSIS — N186 End stage renal disease: Secondary | ICD-10-CM

## 2012-07-18 DIAGNOSIS — Z87448 Personal history of other diseases of urinary system: Secondary | ICD-10-CM | POA: Insufficient documentation

## 2012-07-18 DIAGNOSIS — Z8719 Personal history of other diseases of the digestive system: Secondary | ICD-10-CM | POA: Insufficient documentation

## 2012-07-18 DIAGNOSIS — R7989 Other specified abnormal findings of blood chemistry: Secondary | ICD-10-CM

## 2012-07-18 DIAGNOSIS — E0590 Thyrotoxicosis, unspecified without thyrotoxic crisis or storm: Secondary | ICD-10-CM | POA: Insufficient documentation

## 2012-07-18 HISTORY — DX: Morbid (severe) obesity due to excess calories: E66.01

## 2012-07-18 HISTORY — DX: Hyperparathyroidism, unspecified: E21.3

## 2012-07-18 LAB — COMPREHENSIVE METABOLIC PANEL
Alkaline Phosphatase: 129 U/L — ABNORMAL HIGH (ref 39–117)
BUN: 27 mg/dL — ABNORMAL HIGH (ref 6–23)
Creatinine, Ser: 7.68 mg/dL — ABNORMAL HIGH (ref 0.50–1.35)
GFR calc Af Amer: 10 mL/min — ABNORMAL LOW (ref >90)
Glucose, Bld: 117 mg/dL — ABNORMAL HIGH (ref 70–99)
Potassium: 4.4 mEq/L (ref 3.5–5.1)
Total Protein: 7.9 g/dL (ref 6.0–8.3)

## 2012-07-18 LAB — CBC WITH DIFFERENTIAL/PLATELET
Basophils Absolute: 0 10*3/uL (ref 0.0–0.1)
Basophils Relative: 0 % (ref 0–1)
Eosinophils Absolute: 0.2 10*3/uL (ref 0.0–0.7)
HCT: 29.8 % — ABNORMAL LOW (ref 39.0–52.0)
HCT: 33.8 % — ABNORMAL LOW (ref 39.0–52.0)
Hemoglobin: 10.1 g/dL — ABNORMAL LOW (ref 13.0–17.0)
Lymphocytes Relative: 9 % — ABNORMAL LOW (ref 12–46)
MCH: 30.5 pg (ref 26.0–34.0)
MCHC: 33.9 g/dL (ref 30.0–36.0)
MCV: 90 fL (ref 78.0–100.0)
Monocytes Absolute: 0.6 10*3/uL (ref 0.1–1.0)
Monocytes Absolute: 1 10*3/uL (ref 0.1–1.0)
Monocytes Relative: 7 % (ref 3–12)
Neutro Abs: 7.2 10*3/uL (ref 1.7–7.7)
Platelets: 196 10*3/uL (ref 150–400)
RBC: 3.78 MIL/uL — ABNORMAL LOW (ref 4.22–5.81)
RDW: 12.8 % (ref 11.5–15.5)
WBC: 9.2 10*3/uL (ref 4.0–10.5)

## 2012-07-18 LAB — BASIC METABOLIC PANEL
BUN: 70 mg/dL — ABNORMAL HIGH (ref 6–23)
Creatinine, Ser: 13.23 mg/dL — ABNORMAL HIGH (ref 0.50–1.35)
GFR calc Af Amer: 5 mL/min — ABNORMAL LOW (ref >90)
GFR calc non Af Amer: 4 mL/min — ABNORMAL LOW (ref >90)

## 2012-07-18 LAB — POCT I-STAT TROPONIN I: Troponin i, poc: 0 ng/mL (ref 0.00–0.08)

## 2012-07-18 MED ORDER — HYDROCODONE-ACETAMINOPHEN 5-325 MG PO TABS
2.0000 | ORAL_TABLET | Freq: Once | ORAL | Status: AC
  Administered 2012-07-18: 2 via ORAL
  Filled 2012-07-18: qty 2

## 2012-07-18 MED ORDER — IBUPROFEN 800 MG PO TABS: 800.0000 mg | ORAL_TABLET | Freq: Three times a day (TID) | ORAL | Status: DC

## 2012-07-18 MED ORDER — HYDROMORPHONE HCL PF 1 MG/ML IJ SOLN
1.0000 mg | Freq: Once | INTRAMUSCULAR | Status: AC
  Administered 2012-07-18: 1 mg via INTRAMUSCULAR
  Filled 2012-07-18: qty 1

## 2012-07-18 MED ORDER — FAMOTIDINE 20 MG PO TABS
20.0000 mg | ORAL_TABLET | Freq: Once | ORAL | Status: AC
  Administered 2012-07-18: 20 mg via ORAL
  Filled 2012-07-18: qty 1

## 2012-07-18 MED ORDER — OXYCODONE-ACETAMINOPHEN 5-325 MG PO TABS: 1.0000 | ORAL_TABLET | ORAL | Status: DC | PRN

## 2012-07-18 MED ORDER — KETOROLAC TROMETHAMINE 60 MG/2ML IM SOLN
60.0000 mg | Freq: Once | INTRAMUSCULAR | Status: AC
  Administered 2012-07-18: 60 mg via INTRAMUSCULAR
  Filled 2012-07-18: qty 2

## 2012-07-18 MED ORDER — GI COCKTAIL ~~LOC~~
30.0000 mL | Freq: Once | ORAL | Status: AC
  Administered 2012-07-18: 30 mL via ORAL
  Filled 2012-07-18: qty 30

## 2012-07-18 NOTE — ED Notes (Addendum)
Pt alert, NAD, calm, interactive, skin W&D, resps e/u, speaking in clear complete sentences, c/o hands tingling (resolved) feet tingling, dizziness and L back pain. (denies: nvd, fever, sob or other sx), HD on MWF, "full HD on Friday". Access site R FA fistula. +bruit/thrill.

## 2012-07-18 NOTE — ED Notes (Signed)
PT. REPORTS MID CHEST PAIN AND LOW BACK PAIN ONSET TODAY WITH SLIGHT SOB , DENIES NAUSEA OR DIAPHORESIS , STATES HEMODIALYSIS TODAY ( MON/WED/FRI) .

## 2012-07-18 NOTE — ED Provider Notes (Signed)
Medical screening examination/treatment/procedure(s) were performed by non-physician practitioner and as supervising physician I was immediately available for consultation/collaboration.   Myranda Pavone B. Karle Starch, MD 07/18/12 1415

## 2012-07-18 NOTE — ED Provider Notes (Addendum)
History     CSN: [id number]  Arrival date & time 07/18/12  2154   First MD Initiated Contact with Patient 07/18/12 2255      Chief Complaint  Patient presents with  . Chest Pain  . Follow-up    (Consider location/radiation/quality/duration/timing/severity/associated sxs/prior treatment) Patient is a 26 y.o. male presenting with chest pain. The history is provided by the patient.  Chest Pain Pertinent negatives for primary symptoms include no fever, no shortness of breath, no abdominal pain and no vomiting.   pt w esrd due to htn, on hd m/w/f, went to hd today. States for past day, constant, dull, midline chest pain, worse w movement, change in position and non productive cough. No radiation of pain. No associated nv, diaphoresis or sob. Pain is not pleuritic. No positional change, no change whether upright or supine. Notes hx gerd, unsure if same. No hx chf, cad, drug/cocaine use, or family hx premature cad. No pleuritic pain. No leg pain or swelling. Denies recent immobility, trauma, surgery, travel, or hx dvt or pe. Also states low back pain for past couple days, states was in ed for eval of same. Non radiation, non radicular. No leg numbness/weakness. No abd or flank pain. Denies back injury. Denies fever or chills. Non prod cough, no sore throat, or other uri c/o.  Past Medical History  Diagnosis Date  . Renal disorder   . Hyperparathyroidism   . Anemia   . Dyslipidemia   . Pancreatitis   . Morbid obesity     Past Surgical History  Procedure Date  . Av fistula placement     No family history on file.  History  Substance Use Topics  . Smoking status: Current Every Day Smoker -- 0.5 packs/day    Types: Cigarettes  . Smokeless tobacco: Not on file  . Alcohol Use: No      Review of Systems  Constitutional: Negative for fever and chills.  HENT: Negative for neck pain.   Eyes: Negative for redness.  Respiratory: Negative for shortness of breath.   Cardiovascular:  Positive for chest pain. Negative for leg swelling.  Gastrointestinal: Negative for vomiting, abdominal pain and diarrhea.  Genitourinary: Negative for flank pain.  Musculoskeletal: Positive for back pain.  Skin: Negative for rash.  Neurological: Negative for headaches.  Hematological: Does not bruise/bleed easily.  Psychiatric/Behavioral: Negative for confusion.    Allergies  Review of patient's allergies indicates no known allergies.  Home Medications   Current Outpatient Rx  Name  Route  Sig  Dispense  Refill  . CALCIUM ACETATE 667 MG PO CAPS   Oral   Take 3,335 mg by mouth 3 (three) times daily with meals.         Marland Kitchen CINACALCET HCL 90 MG PO TABS   Oral   Take 90 mg by mouth daily.         . IBUPROFEN 800 MG PO TABS   Oral   Take 1 tablet (800 mg total) by mouth 3 (three) times daily.   12 tablet   0   . OXYCODONE-ACETAMINOPHEN 5-325 MG PO TABS   Oral   Take 1 tablet by mouth every 4 (four) hours as needed for pain.   20 tablet   0   . PANTOPRAZOLE SODIUM 40 MG PO TBEC   Oral   Take 1 tablet (40 mg total) by mouth daily.   30 tablet   0     BP 123/51  Pulse 101  Temp 98.5 F (  36.9 C) (Oral)  Resp 19  SpO2 98%  Physical Exam  Nursing note and vitals reviewed. Constitutional: He is oriented to person, place, and time. He appears well-developed and well-nourished. No distress.  HENT:  Head: Atraumatic.  Nose: Nose normal.  Mouth/Throat: Oropharynx is clear and moist.  Eyes: Conjunctivae normal are normal.  Neck: Normal range of motion. Neck supple. No tracheal deviation present.  Cardiovascular: Normal rate, regular rhythm, normal heart sounds and intact distal pulses.  Exam reveals no gallop and no friction rub.   No murmur heard. Pulmonary/Chest: Effort normal and breath sounds normal. No accessory muscle usage. No respiratory distress. He exhibits tenderness.  Abdominal: Soft. Bowel sounds are normal. He exhibits no distension and no mass. There  is no tenderness. There is no rebound and no guarding.  Musculoskeletal: Normal range of motion. He exhibits no edema and no tenderness.       tls spine non tender, aligned. Lumbar muscular tenderness. Right forearm av fistula w thrill. No leg pain, swelling, or tenderness.  Neurological: He is alert and oriented to person, place, and time.  Skin: Skin is warm and dry.  Psychiatric: He has a normal mood and affect.    ED Course  Procedures (including critical care time)   Results for orders placed during the hospital encounter of 07/18/12  COMPREHENSIVE METABOLIC PANEL      Component Value Range   Sodium 139  135 - 145 mEq/L   Potassium 4.4  3.5 - 5.1 mEq/L   Chloride 94 (*) 96 - 112 mEq/L   CO2 29  19 - 32 mEq/L   Glucose, Bld 117 (*) 70 - 99 mg/dL   BUN 27 (*) 6 - 23 mg/dL   Creatinine, Ser 7.68 (*) 0.50 - 1.35 mg/dL   Calcium 8.3 (*) 8.4 - 10.5 mg/dL   Total Protein 7.9  6.0 - 8.3 g/dL   Albumin 4.0  3.5 - 5.2 g/dL   AST 89 (*) 0 - 37 U/L   ALT 74 (*) 0 - 53 U/L   Alkaline Phosphatase 129 (*) 39 - 117 U/L   Total Bilirubin 0.3  0.3 - 1.2 mg/dL   GFR calc non Af Amer 9 (*) >90 mL/min   GFR calc Af Amer 10 (*) >90 mL/min  CBC WITH DIFFERENTIAL      Component Value Range   WBC 9.2  4.0 - 10.5 K/uL   RBC 3.78 (*) 4.22 - 5.81 MIL/uL   Hemoglobin 11.5 (*) 13.0 - 17.0 g/dL   HCT 33.8 (*) 39.0 - 52.0 %   MCV 89.4  78.0 - 100.0 fL   MCH 30.4  26.0 - 34.0 pg   MCHC 34.0  30.0 - 36.0 g/dL   RDW 12.8  11.5 - 15.5 %   Platelets 196  150 - 400 K/uL   Neutrophils Relative 78 (*) 43 - 77 %   Neutro Abs 7.2  1.7 - 7.7 K/uL   Lymphocytes Relative 9 (*) 12 - 46 %   Lymphs Abs 0.8  0.7 - 4.0 K/uL   Monocytes Relative 11  3 - 12 %   Monocytes Absolute 1.0  0.1 - 1.0 K/uL   Eosinophils Relative 2  0 - 5 %   Eosinophils Absolute 0.2  0.0 - 0.7 K/uL   Basophils Relative 0  0 - 1 %   Basophils Absolute 0.0  0.0 - 0.1 K/uL  POCT I-STAT TROPONIN I      Component Value Range  Troponin  i, poc 0.00  0.00 - 0.08 ng/mL   Comment 3            Dg Chest 2 View  07/18/2012  *RADIOLOGY REPORT*  Clinical Data: Chest pain  CHEST - 2 VIEW  Comparison: 02/07/2008  Findings: Increased interstitial markings, possibly chronic.  No focal consolidation. No pleural effusion or pneumothorax.  Cardiomediastinal silhouette is within normal limits.  Mild degenerative changes of the visualized thoracolumbar spine.  IMPRESSION: No evidence of acute cardiopulmonary disease.   Original Report Authenticated By: Julian Hy, M.D.       MDM  Labs. Cxr.  Reviewed nursing notes and prior charts for additional history.   Pt w chest wall tenderness w palp reproducing smpoms, also w low back back. vicodin for pain. Pt also notes hx gerd, gi cocktail and pepcid given.     Date: 07/18/2012  Rate: 98  Rhythm: normal sinus rhythm  QRS Axis: normal  Intervals: normal  ST/T Wave abnormalities: normal  Conduction Disutrbances:none  Narrative Interpretation:   Old EKG Reviewed: unchanged   Pt w chest wall tenderness, reproducing symptoms. After chest symptoms present/constant for past day, trop neg/0.   elev lft note, reviewed prior u/s'ss neg for gallstones/acute process.  Lipase normal. No abd pain or tenderness on repeat exam.  Pt comfortable. Afeb. Appears stable for d/c. rec close pcp follow up in the next day or two for recheck.  Recheck pt comfortable. No sob. No other c/o. Hr 90. Po 99% ra. No inc wob. abd soft nt. Denies any chol med use, denies taking any medication other than as prescribed, not taking any extra/additional tylenol or acetaminophen. Discussed lfts and need for close f/u, and will have hold/stop protonix for now until pcp follow up.       Mirna Mires, MD 07/19/12 [phone]

## 2012-07-18 NOTE — ED Notes (Signed)
Lab at BS 

## 2012-07-18 NOTE — ED Notes (Signed)
ED PA at BS 

## 2012-07-18 NOTE — ED Provider Notes (Signed)
History     CSN: [id number]  Arrival date & time 07/17/12  2316   First MD Initiated Contact with Patient 07/18/12 0133      Chief Complaint  Patient presents with  . Dizziness    (Consider location/radiation/quality/duration/timing/severity/associated sxs/prior treatment) HPI History provided by pt.   Pt w/ h/o ESRD on hemodialysis MWF, presents w/ c/o intermittent dizzy spells.  Started 27 days ago and most recent episode yesterday early afternoon.  Describes as brief room-spinning sensation w/ associated ataxia (falls to either left or right while walking), mildly blurred vision and tingling in all fingers and toes.  Currently asymptomatic w/ exception of right-sided headache that started here in ED.  Denies recent head trauma.  Also c/o non-traumatic, non-radiating, left low back pain for the past 2 [place].  Intermittent, worst in am when he wakes, improves throughout day, aggravated by movement.  No recent fever, extremity weakness or bowel/bladder dysfunction.      Past Medical History  Diagnosis Date  . Renal disorder   . Hyperparathyroidism   . Anemia   . Dyslipidemia   . Pancreatitis   . Morbid obesity     Past Surgical History  Procedure Date  . Av fistula placement     History reviewed. No pertinent family history.  History  Substance Use Topics  . Smoking status: Current Every Day Smoker -- 0.5 packs/day    Types: Cigarettes  . Smokeless tobacco: Not on file  . Alcohol Use: No      Review of Systems  All other systems reviewed and are negative.    Allergies  Review of patient's allergies indicates no known allergies.  Home Medications   Current Outpatient Rx  Name  Route  Sig  Dispense  Refill  . CALCIUM ACETATE 667 MG PO CAPS   Oral   Take 3,335 mg by mouth 3 (three) times daily with meals.         Marland Kitchen CINACALCET HCL 90 MG PO TABS   Oral   Take 90 mg by mouth daily.         Marland Kitchen PANTOPRAZOLE SODIUM 40 MG PO TBEC   Oral   Take 1 tablet (40  mg total) by mouth daily.   30 tablet   0     BP 138/74  Temp 98.6 F (37 C) (Oral)  Resp 18  SpO2 99%  Physical Exam  Nursing note and vitals reviewed. Constitutional: He is oriented to person, place, and time. He appears well-developed and well-nourished.       Morbidly obese  HENT:  Head: Normocephalic and atraumatic.       Nml TM and external auditory canal bilaterally.  Eyes:       Normal appearance  Neck: Normal range of motion.  Cardiovascular: Normal rate and regular rhythm.   Pulmonary/Chest: Effort normal and breath sounds normal.  Genitourinary:       No CVA ttp  Musculoskeletal:       Lumbar spinal and paraspinal ttp. Full active ROM of LE.  Nml patellar reflexes.  No saddle anesthesia. Distal sensation intact.  2+ DP pulses.  Appears uncomfortable w/ ambulation.   Neurological: He is alert and oriented to person, place, and time.       CN 3-12 intact.  No sensory deficits.  5/5 and equal upper and lower extremity strength.  No past pointing. Nml gait.  Negative romberg.   Skin: Skin is warm and dry. No rash noted.  Psychiatric: He has a  normal mood and affect. His behavior is normal.    ED Course  Procedures (including critical care time)   Date: 07/18/2012  Rate: 82  Rhythm: normal sinus rhythm  QRS Axis: normal  Intervals: normal  ST/T Wave abnormalities: normal  Conduction Disutrbances:none  Narrative Interpretation:   Old EKG Reviewed: none available   Labs Reviewed  CBC WITH DIFFERENTIAL - Abnormal; Notable for the following:    RBC 3.31 (*)     Hemoglobin 10.1 (*)     HCT 29.8 (*)     All other components within normal limits  BASIC METABOLIC PANEL - Abnormal; Notable for the following:    Chloride 94 (*)     Glucose, Bld 112 (*)     BUN 70 (*)     Creatinine, Ser 13.23 (*)     Calcium 7.9 (*)     GFR calc non Af Amer 4 (*)     GFR calc Af Amer 5 (*)     All other components within normal limits   No results found.   1. Low back  pain   2. Vertigo       MDM  25yo M w/ HTN and ESRD (does not produce any urine) presents w/ c/o of non-traumatic L low back pain.  No indication for emergent imaging this evening; s/sx most consistent w/ lumbar strain, patient is ambulatory and pain improved w/ IM dilaudid and toradol.  Also c/o intermittent room-spinning dizziness over the weekend.  Multiple episodes Saturday and most recent episode early afternoon Sunday.  EKG non-ischemic/NSR and labs unremarkable.  Results discussed w/ pt.  Doubt TIA based on patient's age and intermittent nature of sx.  Recommended close f/u with PCP and return to ER for worsening sx.      Remer Macho, [name] 07/18/12 [phone]

## 2012-07-19 LAB — LIPASE, BLOOD: Lipase: 45 U/L (ref 11–59)

## 2012-07-19 NOTE — ED Notes (Signed)
Pt states that back and stomach are feeling better.  Family at bedside. Nad.

## 2012-07-19 NOTE — ED Notes (Signed)
Pt dc to home.  Pt states understanding to dc instructions.  Pt ambulatory to exit. Denies need for w/c

## 2013-02-07 ENCOUNTER — Emergency Department (HOSPITAL_COMMUNITY)
Admission: EM | Admit: 2013-02-07 | Discharge: 2013-02-07 | Disposition: A | Discharge status: Home / Self Care | Payer: Medicare Other | Attending: Emergency Medicine | Admitting: Emergency Medicine

## 2013-02-07 ENCOUNTER — Encounter (HOSPITAL_COMMUNITY): Payer: Self-pay | Admitting: Emergency Medicine

## 2013-02-07 DIAGNOSIS — Z862 Personal history of diseases of the blood and blood-forming organs and certain disorders involving the immune mechanism: Secondary | ICD-10-CM | POA: Insufficient documentation

## 2013-02-07 DIAGNOSIS — R269 Unspecified abnormalities of gait and mobility: Secondary | ICD-10-CM | POA: Insufficient documentation

## 2013-02-07 DIAGNOSIS — F17200 Nicotine dependence, unspecified, uncomplicated: Secondary | ICD-10-CM | POA: Insufficient documentation

## 2013-02-07 DIAGNOSIS — Z9181 History of falling: Secondary | ICD-10-CM | POA: Insufficient documentation

## 2013-02-07 DIAGNOSIS — Z8639 Personal history of other endocrine, nutritional and metabolic disease: Secondary | ICD-10-CM | POA: Insufficient documentation

## 2013-02-07 DIAGNOSIS — Z8719 Personal history of other diseases of the digestive system: Secondary | ICD-10-CM | POA: Insufficient documentation

## 2013-02-07 DIAGNOSIS — Z87448 Personal history of other diseases of urinary system: Secondary | ICD-10-CM | POA: Insufficient documentation

## 2013-02-07 DIAGNOSIS — M25569 Pain in unspecified knee: Secondary | ICD-10-CM | POA: Insufficient documentation

## 2013-02-07 DIAGNOSIS — M25562 Pain in left knee: Secondary | ICD-10-CM

## 2013-02-07 DIAGNOSIS — E213 Hyperparathyroidism, unspecified: Secondary | ICD-10-CM | POA: Insufficient documentation

## 2013-02-07 DIAGNOSIS — G8929 Other chronic pain: Secondary | ICD-10-CM

## 2013-02-07 DIAGNOSIS — M25469 Effusion, unspecified knee: Secondary | ICD-10-CM | POA: Insufficient documentation

## 2013-02-07 DIAGNOSIS — Z79899 Other long term (current) drug therapy: Secondary | ICD-10-CM | POA: Insufficient documentation

## 2013-02-07 MED ORDER — HYDROCODONE-ACETAMINOPHEN 5-325 MG PO TABS: 1.0000 | ORAL_TABLET | Freq: Four times a day (QID) | ORAL | Status: DC | PRN

## 2013-02-07 NOTE — ED Provider Notes (Signed)
   History    CSN: [id number] Arrival date & time 02/07/13  2322  First MD Initiated Contact with Patient 02/07/13 2325     Chief Complaint  Patient presents with  . Knee Pain   (Consider location/radiation/quality/duration/timing/severity/associated sxs/prior Treatment) HPI Comments: Patient with chronic L knee pain since December 2013 after a fall has been followed by Dr. Rush Farmer, has had steroid injections, now out of Vicodin and having pain   Patient is a 27 y.o. male presenting with knee pain. The history is provided by the patient.  Knee Pain Location:  Knee Knee location:  L knee Pain details:    Quality:  Aching   Radiates to:  Does not radiate   Severity:  Moderate   Timing:  Constant Associated symptoms: no fever    Past Medical History  Diagnosis Date  . Renal disorder   . Hyperparathyroidism   . Anemia   . Dyslipidemia   . Pancreatitis   . Morbid obesity    Past Surgical History  Procedure Laterality Date  . Av fistula placement     No family history on file. History  Substance Use Topics  . Smoking status: Current Every Day Smoker -- 0.50 packs/day    Types: Cigarettes  . Smokeless tobacco: Not on file  . Alcohol Use: No    Review of Systems  Constitutional: Negative for fever.  Musculoskeletal: Positive for joint swelling and gait problem.  Skin: Negative for rash and wound.  All other systems reviewed and are negative.    Allergies  Review of patient's allergies indicates no known allergies.  Home Medications   Current Outpatient Rx  Name  Route  Sig  Dispense  Refill  . calcium acetate (PHOSLO) 667 MG capsule   Oral   Take 3,305 mg by mouth 3 (three) times daily with meals.          . cinacalcet (SENSIPAR) 90 MG tablet   Oral   Take 90 mg by mouth 2 (two) times daily.           BP 128/67  Pulse 87  Temp(Src) 98.8 F (37.1 C) (Oral)  Resp 18  SpO2 96% Physical Exam  Nursing note and vitals reviewed. Constitutional: He  appears well-developed and well-nourished.  Morbidly obese  HENT:  Head: Normocephalic.  Eyes: Pupils are equal, round, and reactive to light.  Neck: Normal range of motion.  Cardiovascular: Normal rate.   Pulmonary/Chest: Effort normal.  Musculoskeletal: Normal range of motion. He exhibits no edema and no tenderness.  Neurological: He is alert.  Skin: Skin is warm. No rash noted. No pallor.    ED Course  Procedures (including critical care time) Labs Reviewed - No data to display No results found. No diagnosis found.  MDM  I have encouraged patient to call and make an appointment with Dr. Rush Farmer for further treatment of his chronic knee pain   Garald Balding, NP 02/07/13 2354

## 2013-02-07 NOTE — ED Notes (Signed)
PT. REPORTS LEFT KNEE PAIN ONSET TODAY DENIES INJURY OR FALL . AMBULATORY. HEMODIALYSIS MON/WED/FRI.

## 2013-02-08 NOTE — ED Provider Notes (Signed)
Medical screening examination/treatment/procedure(s) were performed by non-physician practitioner and as supervising physician I was immediately available for consultation/collaboration.  Kalman Drape, MD 02/08/13 [phone]

## 2013-02-21 ENCOUNTER — Encounter (INDEPENDENT_AMBULATORY_CARE_PROVIDER_SITE_OTHER): Payer: Self-pay | Admitting: Surgery

## 2013-03-14 ENCOUNTER — Encounter (INDEPENDENT_AMBULATORY_CARE_PROVIDER_SITE_OTHER): Payer: Self-pay | Admitting: Surgery

## 2013-03-14 ENCOUNTER — Ambulatory Visit (INDEPENDENT_AMBULATORY_CARE_PROVIDER_SITE_OTHER): Payer: Medicare Other | Admitting: Surgery

## 2013-03-14 VITALS — BP 130/72 | HR 68 | Resp 16 | Ht 66.0 in | Wt 290.2 lb

## 2013-03-14 DIAGNOSIS — N2581 Secondary hyperparathyroidism of renal origin: Secondary | ICD-10-CM | POA: Insufficient documentation

## 2013-03-14 NOTE — Progress Notes (Signed)
General Surgery [facility]  Chief Complaint  Patient presents with  . New Evaluation    secondary hyperparathyroidism, ESRD - referral from Dr. Mauricia Area, [facility]    HISTORY: Patient is a 27 year old male referred by his nephrologist for evaluation and management of secondary hyperparathyroidism. Patient has been on hemodialysis since 2005. His access is in the right forearm. He dialyzes at the [facility] center on Mondays Wednesdays and Fridays.  The patient has secondary hyperparathyroidism. Intact PTH levels range from 1200 to nearly 1700. Calcium times phosphorus product is elevated at 62. Patient has been on Sensipar but has not been completely compliant with taking this medication do to gastrointestinal symptoms. Patient is now referred for consideration for total parathyroidectomy with autotransplantation.  Patient has had no prior surgery on the neck. He has no history of endocrine disease.  Past Medical History  Diagnosis Date  . Renal disorder   . Hyperparathyroidism   . Anemia   . Dyslipidemia   . Pancreatitis   . Morbid obesity   . Hypertension     Current Outpatient Prescriptions  Medication Sig Dispense Refill  . calcium acetate (PHOSLO) 667 MG capsule Take 3,305 mg by mouth 3 (three) times daily with meals.       . cinacalcet (SENSIPAR) 90 MG tablet Take 90 mg by mouth 2 (two) times daily.       . chlorhexidine (PERIDEX) 0.12 % solution        No current facility-administered medications for this visit.    No Known Allergies  History reviewed. No pertinent family history.  History   Social History  . Marital Status: Single    Spouse Name: N/A    Number of Children: N/A  . Years of Education: N/A   Social History Main Topics  . Smoking status: Current Every Day Smoker -- 0.50 packs/day    Types: Cigarettes  . Smokeless tobacco: None  . Alcohol Use: No  . Drug Use: No  . Sexually Active: None   Other  Topics Concern  . None   Social History Narrative  . None    REVIEW OF SYSTEMS - PERTINENT POSITIVES ONLY: Denies compressive symptoms. Denies tremor. Denies palpitations.  EXAM: Filed Vitals:   03/14/13 1039  BP: 130/72  Pulse: 68  Resp: 16    HEENT: normocephalic; pupils equal and reactive; sclerae clear; dentition good; mucous membranes moist NECK:  No palpable masses in the thyroid bed; symmetric on extension; no palpable anterior or posterior cervical lymphadenopathy; no supraclavicular masses; no tenderness CHEST: clear to auscultation bilaterally without rales, rhonchi, or wheezes CARDIAC: regular rate and rhythm without significant murmur; peripheral pulses are full EXT:  non-tender without edema; no deformity; bilateral forearm fistulae with palpable thrill NEURO: no gross focal deficits; no sign of tremor   LABORATORY RESULTS: See [facility] ([facility]Epic) for most recent results  RADIOLOGY RESULTS: See [facility] (CHL-Epic) for most recent results  IMPRESSION: #1 secondary hyperparathyroidism #2 end-stage renal disease  PLAN: I discussed the above findings at length with the patient. We reviewed his laboratory studies. I explained the procedure of total parathyroidectomy with autotransplantation to him. We discussed the procedure, the hospital stay, and his postoperative recovery. We discussed the possibility of recurrent disease in need for additional surgery. He understands and wishes to proceed. We will make arrangements and coordinate with his nephrologist for surgery in the near future.  The risks and benefits of the procedure have been discussed at length  with the patient.  The patient understands the proposed procedure, potential alternative treatments, and the course of recovery to be expected.  All of the patient's questions have been answered at this time.  The patient wishes to proceed with surgery.  Earnstine Regal, MD, [facility]  Primary Care Physician: Placido Sou, MD

## 2013-03-14 NOTE — Patient Instructions (Signed)

## 2013-03-25 NOTE — Pre-Procedure Instructions (Addendum)
[name]  03/25/2013   Your procedure is scheduled on:  August 28  Report to [facility] at 11:30 AM.  Call this number if you have problems the morning of surgery: [phone]   Remember:   Do not eat food or drink liquids after midnight.   Take these medicines the morning of surgery with A SIP OF WATER: None    Do not take Aspirin, Aleve, Naproxen, Advil, Ibuprofen, Vitamin, Herbs, or Supplements starting today   Do not wear jewelry, make-up or nail polish.  Do not wear lotions, powders, or perfumes. You may wear deodorant.  Do not shave 48 hours prior to surgery. Men may shave face and neck.  Do not bring valuables to the hospital.  [facility] is not responsible for any belongings or valuables.  Contacts, dentures or bridgework may not be worn into surgery.  Leave suitcase in the car. After surgery it may be brought to your room.  For patients admitted to the hospital, checkout time is 11:00 AM the day of discharge.   Patients discharged the day of surgery will not be allowed to drive home.  Name and phone number of your driver: Family/ Friend   Special Instructions: Shower using CHG 2 nights before surgery and the night before surgery.  If you shower the day of surgery use CHG.  Use special wash - you have one bottle of CHG for all showers.  You should use approximately 1/3 of the bottle for each shower.   Please read over the following fact sheets that you were given: Pain Booklet, Coughing and Deep Breathing and Surgical Site Infection Prevention

## 2013-03-27 ENCOUNTER — Encounter (HOSPITAL_COMMUNITY): Payer: Self-pay

## 2013-03-27 ENCOUNTER — Encounter (HOSPITAL_COMMUNITY)
Admission: RE | Admit: 2013-03-27 | Discharge: 2013-03-27 | Disposition: A | Discharge status: Home / Self Care | Payer: Medicare Other | Source: Ambulatory Visit | Attending: Surgery | Admitting: Surgery

## 2013-03-27 HISTORY — DX: Unspecified osteoarthritis, unspecified site: M19.90

## 2013-03-27 HISTORY — DX: Gastro-esophageal reflux disease without esophagitis: K21.9

## 2013-03-27 HISTORY — DX: Adverse effect of unspecified anesthetic, initial encounter: T41.45XA

## 2013-03-27 HISTORY — DX: Other complications of anesthesia, initial encounter: T88.59XA

## 2013-03-29 MED ORDER — DEXTROSE 5 % IV SOLN
3.0000 g | INTRAVENOUS | Status: AC
  Administered 2013-03-30: 3 g via INTRAVENOUS
  Filled 2013-03-29: qty 3000

## 2013-03-30 ENCOUNTER — Ambulatory Visit (HOSPITAL_COMMUNITY): Payer: Medicare Other | Admitting: Anesthesiology

## 2013-03-30 ENCOUNTER — Encounter (HOSPITAL_COMMUNITY): Payer: Self-pay

## 2013-03-30 ENCOUNTER — Encounter (HOSPITAL_COMMUNITY)
Admission: RE | Disposition: A | Discharge status: Home / Self Care | Payer: Self-pay | Source: Ambulatory Visit | Attending: Surgery

## 2013-03-30 ENCOUNTER — Inpatient Hospital Stay (HOSPITAL_COMMUNITY)
Admission: RE | Admit: 2013-03-30 | Discharge: 2013-04-03 | DRG: 674 | Disposition: A | Discharge status: Home / Self Care | Payer: Medicare Other | Source: Ambulatory Visit | Attending: Surgery | Admitting: Surgery

## 2013-03-30 ENCOUNTER — Encounter (HOSPITAL_COMMUNITY): Payer: Self-pay | Admitting: Anesthesiology

## 2013-03-30 DIAGNOSIS — K219 Gastro-esophageal reflux disease without esophagitis: Secondary | ICD-10-CM | POA: Diagnosis present

## 2013-03-30 DIAGNOSIS — I120 Hypertensive chronic kidney disease with stage 5 chronic kidney disease or end stage renal disease: Secondary | ICD-10-CM | POA: Diagnosis present

## 2013-03-30 DIAGNOSIS — F17200 Nicotine dependence, unspecified, uncomplicated: Secondary | ICD-10-CM | POA: Diagnosis present

## 2013-03-30 DIAGNOSIS — N186 End stage renal disease: Secondary | ICD-10-CM

## 2013-03-30 DIAGNOSIS — Z992 Dependence on renal dialysis: Secondary | ICD-10-CM

## 2013-03-30 DIAGNOSIS — D649 Anemia, unspecified: Secondary | ICD-10-CM | POA: Diagnosis present

## 2013-03-30 DIAGNOSIS — M129 Arthropathy, unspecified: Secondary | ICD-10-CM | POA: Diagnosis present

## 2013-03-30 DIAGNOSIS — Z6841 Body Mass Index (BMI) 40.0 and over, adult: Secondary | ICD-10-CM

## 2013-03-30 DIAGNOSIS — Z91199 Patient's noncompliance with other medical treatment and regimen due to unspecified reason: Secondary | ICD-10-CM

## 2013-03-30 DIAGNOSIS — Z9119 Patient's noncompliance with other medical treatment and regimen: Secondary | ICD-10-CM

## 2013-03-30 DIAGNOSIS — N2581 Secondary hyperparathyroidism of renal origin: Secondary | ICD-10-CM | POA: Diagnosis present

## 2013-03-30 DIAGNOSIS — Z79899 Other long term (current) drug therapy: Secondary | ICD-10-CM

## 2013-03-30 DIAGNOSIS — E785 Hyperlipidemia, unspecified: Secondary | ICD-10-CM | POA: Diagnosis present

## 2013-03-30 HISTORY — PX: PARATHYROIDECTOMY: SHX19

## 2013-03-30 LAB — RENAL FUNCTION PANEL
Albumin: 3.7 g/dL (ref 3.5–5.2)
BUN: 29 mg/dL — ABNORMAL HIGH (ref 6–23)
Chloride: 95 mEq/L — ABNORMAL LOW (ref 96–112)
Creatinine, Ser: 8.48 mg/dL — ABNORMAL HIGH (ref 0.50–1.35)
GFR calc non Af Amer: 8 mL/min — ABNORMAL LOW (ref >90)
Phosphorus: 4.1 mg/dL (ref 2.3–4.6)
Potassium: 3.8 mEq/L (ref 3.5–5.1)

## 2013-03-30 LAB — CALCIUM: Calcium: 7.5 mg/dL — ABNORMAL LOW (ref 8.4–10.5)

## 2013-03-30 LAB — POCT I-STAT 4, (NA,K, GLUC, HGB,HCT): Glucose, Bld: 105 mg/dL — ABNORMAL HIGH (ref 70–99)

## 2013-03-30 SURGERY — PARATHYROIDECTOMY, WITH AUTOGRAFT TRANSPLANT
Anesthesia: General | Site: Neck | Wound class: Clean

## 2013-03-30 MED ORDER — HYDROMORPHONE HCL PF 1 MG/ML IJ SOLN
1.0000 mg | INTRAMUSCULAR | Status: DC | PRN
  Administered 2013-03-30 – 2013-04-03 (×14): 1 mg via INTRAVENOUS
  Filled 2013-03-30 (×10): qty 1

## 2013-03-30 MED ORDER — MIDAZOLAM HCL 5 MG/5ML IJ SOLN
INTRAMUSCULAR | Status: DC | PRN
  Administered 2013-03-30: 2 mg via INTRAVENOUS

## 2013-03-30 MED ORDER — HEMOSTATIC AGENTS (NO CHARGE) OPTIME
TOPICAL | Status: DC | PRN
  Administered 2013-03-30: 1 via TOPICAL

## 2013-03-30 MED ORDER — CALCIUM CARBONATE 1250 (500 CA) MG PO TABS: 1250.0000 mg | ORAL_TABLET | Freq: Three times a day (TID) | ORAL | Status: DC

## 2013-03-30 MED ORDER — ONDANSETRON HCL 4 MG/2ML IJ SOLN
INTRAMUSCULAR | Status: DC | PRN
  Administered 2013-03-30: 4 mg via INTRAVENOUS

## 2013-03-30 MED ORDER — LIDOCAINE HCL (CARDIAC) 20 MG/ML IV SOLN
INTRAVENOUS | Status: DC | PRN
  Administered 2013-03-30: 80 mg via INTRAVENOUS

## 2013-03-30 MED ORDER — NEOSTIGMINE METHYLSULFATE 1 MG/ML IJ SOLN
INTRAMUSCULAR | Status: DC | PRN
  Administered 2013-03-30: 4 mg via INTRAVENOUS

## 2013-03-30 MED ORDER — HYDROCODONE-ACETAMINOPHEN 5-325 MG PO TABS
1.0000 | ORAL_TABLET | ORAL | Status: DC | PRN
  Administered 2013-03-31 – 2013-04-03 (×10): 2 via ORAL
  Filled 2013-03-30 (×10): qty 2

## 2013-03-30 MED ORDER — ROCURONIUM BROMIDE 100 MG/10ML IV SOLN
INTRAVENOUS | Status: DC | PRN
  Administered 2013-03-30: 40 mg via INTRAVENOUS
  Administered 2013-03-30 (×3): 10 mg via INTRAVENOUS

## 2013-03-30 MED ORDER — ONDANSETRON HCL 4 MG/2ML IJ SOLN: 4.0000 mg | Freq: Four times a day (QID) | INTRAMUSCULAR | Status: DC | PRN

## 2013-03-30 MED ORDER — FENTANYL CITRATE 0.05 MG/ML IJ SOLN
INTRAMUSCULAR | Status: DC | PRN
  Administered 2013-03-30: 50 ug via INTRAVENOUS
  Administered 2013-03-30: 100 ug via INTRAVENOUS
  Administered 2013-03-30 (×5): 50 ug via INTRAVENOUS

## 2013-03-30 MED ORDER — PROPOFOL 10 MG/ML IV BOLUS
INTRAVENOUS | Status: DC | PRN
  Administered 2013-03-30: 180 mg via INTRAVENOUS

## 2013-03-30 MED ORDER — OXYCODONE HCL 5 MG PO TABS: 5.0000 mg | ORAL_TABLET | Freq: Once | ORAL | Status: AC | PRN

## 2013-03-30 MED ORDER — GLYCOPYRROLATE 0.2 MG/ML IJ SOLN
INTRAMUSCULAR | Status: DC | PRN
  Administered 2013-03-30: 0.6 mg via INTRAVENOUS

## 2013-03-30 MED ORDER — SODIUM CHLORIDE 0.9 % IV SOLN
INTRAVENOUS | Status: DC | PRN
  Administered 2013-03-30 (×2): via INTRAVENOUS

## 2013-03-30 MED ORDER — FENTANYL CITRATE 0.05 MG/ML IJ SOLN
25.0000 ug | INTRAMUSCULAR | Status: DC | PRN
  Administered 2013-03-30: 25 ug via INTRAVENOUS
  Administered 2013-03-30 (×2): 50 ug via INTRAVENOUS
  Administered 2013-03-30: 25 ug via INTRAVENOUS
  Administered 2013-03-30: 50 ug via INTRAVENOUS

## 2013-03-30 MED ORDER — FENTANYL CITRATE 0.05 MG/ML IJ SOLN
INTRAMUSCULAR | Status: AC
  Filled 2013-03-30: qty 2

## 2013-03-30 MED ORDER — ONDANSETRON HCL 4 MG PO TABS: 4.0000 mg | ORAL_TABLET | Freq: Four times a day (QID) | ORAL | Status: DC | PRN

## 2013-03-30 MED ORDER — CALCIUM CARBONATE 1250 MG/5ML PO SUSP
1500.0000 mg | Freq: Three times a day (TID) | ORAL | Status: DC
  Administered 2013-03-30 – 2013-04-01 (×4): 1500 mg via ORAL
  Filled 2013-03-30 (×7): qty 15

## 2013-03-30 MED ORDER — SODIUM CHLORIDE 0.9 % IV SOLN
INTRAVENOUS | Status: DC
  Administered 2013-03-30: 12:00:00 via INTRAVENOUS

## 2013-03-30 MED ORDER — OXYCODONE HCL 5 MG/5ML PO SOLN
ORAL | Status: AC
  Filled 2013-03-30: qty 5

## 2013-03-30 MED ORDER — SODIUM CHLORIDE 0.9 % IV SOLN: INTRAVENOUS | Status: DC

## 2013-03-30 MED ORDER — DOXERCALCIFEROL 4 MCG/2ML IV SOLN: 2.0000 ug | INTRAVENOUS | Status: DC

## 2013-03-30 MED ORDER — OXYCODONE HCL 5 MG/5ML PO SOLN
5.0000 mg | Freq: Once | ORAL | Status: AC | PRN
  Administered 2013-03-30: 5 mg via ORAL

## 2013-03-30 MED ORDER — ESMOLOL HCL 10 MG/ML IV SOLN
INTRAVENOUS | Status: DC | PRN
  Administered 2013-03-30: 20 mg via INTRAVENOUS

## 2013-03-30 MED ORDER — ACETAMINOPHEN 325 MG PO TABS: 650.0000 mg | ORAL_TABLET | ORAL | Status: DC | PRN

## 2013-03-30 MED ORDER — DOXERCALCIFEROL 4 MCG/2ML IV SOLN
2.0000 ug | INTRAVENOUS | Status: DC
  Administered 2013-03-31: 2 ug via INTRAVENOUS
  Filled 2013-03-30: qty 2

## 2013-03-30 SURGICAL SUPPLY — 54 items
BANDAGE ELASTIC 4 VELCRO ST LF (GAUZE/BANDAGES/DRESSINGS) ×2 IMPLANT
BANDAGE GAUZE ELAST BULKY 4 IN (GAUZE/BANDAGES/DRESSINGS) ×2 IMPLANT
BENZOIN TINCTURE PRP APPL 2/3 (GAUZE/BANDAGES/DRESSINGS) ×2 IMPLANT
BLADE SURG 15 STRL LF DISP TIS (BLADE) ×3 IMPLANT
BLADE SURG 15 STRL SS (BLADE) ×3
BLADE SURG ROTATE 9660 (MISCELLANEOUS) ×2 IMPLANT
CANISTER SUCTION 2500CC (MISCELLANEOUS) ×2 IMPLANT
CHLORAPREP W/TINT 10.5 ML (MISCELLANEOUS) ×4 IMPLANT
CLIP TI MEDIUM 24 (CLIP) ×2 IMPLANT
CLIP TI WIDE RED SMALL 24 (CLIP) ×4 IMPLANT
CLOTH BEACON ORANGE TIMEOUT ST (SAFETY) ×2 IMPLANT
CLSR STERI-STRIP ANTIMIC 1/2X4 (GAUZE/BANDAGES/DRESSINGS) ×2 IMPLANT
CONT SPEC 4OZ CLIKSEAL STRL BL (MISCELLANEOUS) ×18 IMPLANT
COVER SURGICAL LIGHT HANDLE (MISCELLANEOUS) ×2 IMPLANT
CRADLE DONUT ADULT HEAD (MISCELLANEOUS) ×2 IMPLANT
DRAPE PED LAPAROTOMY (DRAPES) ×2 IMPLANT
DRAPE SLUSH MACHINE 52X66 (DRAPES) ×2 IMPLANT
DRAPE SLUSH/WARMER DISC (DRAPES) ×2 IMPLANT
DRAPE UTILITY 15X26 W/TAPE STR (DRAPE) ×8 IMPLANT
ELECT CAUTERY BLADE 6.4 (BLADE) ×2 IMPLANT
ELECT REM PT RETURN 9FT ADLT (ELECTROSURGICAL) ×2
ELECTRODE REM PT RTRN 9FT ADLT (ELECTROSURGICAL) ×1 IMPLANT
GAUZE SPONGE 4X4 16PLY XRAY LF (GAUZE/BANDAGES/DRESSINGS) ×4 IMPLANT
GLOVE BIO SURGEON STRL SZ 6.5 (GLOVE) ×2 IMPLANT
GLOVE BIO SURGEON STRL SZ7 (GLOVE) ×4 IMPLANT
GLOVE BIOGEL PI IND STRL 6.5 (GLOVE) ×2 IMPLANT
GLOVE BIOGEL PI IND STRL 7.5 (GLOVE) ×1 IMPLANT
GLOVE BIOGEL PI INDICATOR 6.5 (GLOVE) ×2
GLOVE BIOGEL PI INDICATOR 7.5 (GLOVE) ×1
GLOVE SURG ORTHO 8.0 STRL STRW (GLOVE) ×4 IMPLANT
GLOVE SURG SS PI 6.5 STRL IVOR (GLOVE) ×4 IMPLANT
GOWN STRL NON-REIN LRG LVL3 (GOWN DISPOSABLE) ×8 IMPLANT
GOWN STRL REIN XL XLG (GOWN DISPOSABLE) ×4 IMPLANT
HEMOSTAT SURGICEL 2X4 FIBR (HEMOSTASIS) ×2 IMPLANT
KIT BASIN OR (CUSTOM PROCEDURE TRAY) ×2 IMPLANT
KIT ROOM TURNOVER OR (KITS) ×2 IMPLANT
PACK SURGICAL SETUP 50X90 (CUSTOM PROCEDURE TRAY) ×2 IMPLANT
PAD ARMBOARD 7.5X6 YLW CONV (MISCELLANEOUS) ×2 IMPLANT
PENCIL BUTTON HOLSTER BLD 10FT (ELECTRODE) ×2 IMPLANT
SPECIMEN JAR SMALL (MISCELLANEOUS) ×8 IMPLANT
SPONGE GAUZE 4X4 12PLY (GAUZE/BANDAGES/DRESSINGS) ×4 IMPLANT
SPONGE INTESTINAL PEANUT (DISPOSABLE) ×2 IMPLANT
STRIP CLOSURE SKIN 1/2X4 (GAUZE/BANDAGES/DRESSINGS) ×4 IMPLANT
SUT MNCRL AB 4-0 PS2 18 (SUTURE) ×4 IMPLANT
SUT PROLENE 4 0 RB 1 (SUTURE) ×1
SUT PROLENE 4-0 RB1 .5 CRCL 36 (SUTURE) ×1 IMPLANT
SUT SILK 2 0 (SUTURE) ×1
SUT SILK 2-0 18XBRD TIE 12 (SUTURE) ×1 IMPLANT
SUT VIC AB 3-0 SH 18 (SUTURE) ×4 IMPLANT
SYR BULB 3OZ (MISCELLANEOUS) ×2 IMPLANT
TAPE CLOTH SURG 4X10 WHT LF (GAUZE/BANDAGES/DRESSINGS) ×2 IMPLANT
TOWEL OR 17X24 6PK STRL BLUE (TOWEL DISPOSABLE) ×2 IMPLANT
TOWEL OR 17X26 10 PK STRL BLUE (TOWEL DISPOSABLE) ×2 IMPLANT
TUBE CONNECTING 12X1/4 (SUCTIONS) ×2 IMPLANT

## 2013-03-30 NOTE — Transfer of Care (Signed)
Immediate Anesthesia Transfer of Care Note  Patient: [name]  Procedure(s) Performed: Procedure(s) with comments: TOTAL PARATHYROIDECTOMY WITH AUTOTRANSPLANT (N/A) - Autotransplant to left lower arm.  Patient Location: PACU  Anesthesia Type:General  Level of Consciousness: awake, alert  and oriented  Airway & Oxygen Therapy: Patient Spontanous Breathing and Patient connected to nasal cannula oxygen  Post-op Assessment: Report given to PACU RN and Post -op Vital signs reviewed and stable  Post vital signs: Reviewed and stable  Complications: No apparent anesthesia complications

## 2013-03-30 NOTE — Interval H&P Note (Signed)
History and Physical Interval Note:  03/30/2013 12:18 PM  [name]  has presented today for surgery, with the diagnosis of Secondary hyperparathyroidism.  The various methods of treatment have been discussed with the patient and family. After consideration of risks, benefits and other options for treatment, the patient has consented to    Procedure(s): TOTAL PARATHYROIDECTOMY WITH AUTOTRANSPLANT (N/A) as a surgical intervention .    The patient's history has been reviewed, patient examined, no change in status, stable for surgery.  I have reviewed the patient's chart and labs.  Questions were answered to the patient's satisfaction.    Earnstine Regal, MD, [facility] Office: [employer]

## 2013-03-30 NOTE — Anesthesia Preprocedure Evaluation (Signed)
Anesthesia Evaluation  Patient identified by MRN, date of birth, ID band Patient awake    Reviewed: Allergy & Precautions, H&P , NPO status , Patient's Chart, lab work & pertinent test results  Airway Mallampati: II  Neck ROM: full    Dental   Pulmonary          Cardiovascular hypertension,     Neuro/Psych    GI/Hepatic GERD-  ,  Endo/Other  Morbid obesity  Renal/GU ESRF and DialysisRenal disease     Musculoskeletal  (+) Arthritis -,   Abdominal   Peds  Hematology   Anesthesia Other Findings   Reproductive/Obstetrics                           Anesthesia Physical Anesthesia Plan  ASA: III  Anesthesia Plan: General   Post-op Pain Management:    Induction: Intravenous  Airway Management Planned: Oral ETT  Additional Equipment:   Intra-op Plan:   Post-operative Plan: Extubation in OR  Informed Consent: I have reviewed the patients History and Physical, chart, labs and discussed the procedure including the risks, benefits and alternatives for the proposed anesthesia with the patient or authorized representative who has indicated his/her understanding and acceptance.     Plan Discussed with: CRNA, Anesthesiologist and Surgeon  Anesthesia Plan Comments:         Anesthesia Quick Evaluation

## 2013-03-30 NOTE — Progress Notes (Signed)
C/o difficulty swallowing, neck pain. Patient's O2 sat 99-100% on 2lL [place]. Seen by Dr. Dickie La, Renal. Pt will go to stepdown unit instead of 6E per MD. Dr. Georgette Dover notified.

## 2013-03-30 NOTE — H&P (View-Only) (Signed)
General Surgery [facility]  Chief Complaint  Patient presents with  . New Evaluation    secondary hyperparathyroidism, ESRD - referral from Dr. Mauricia Area, [facility]    HISTORY: Patient is a 27 year old male referred by his nephrologist for evaluation and management of secondary hyperparathyroidism. Patient has been on hemodialysis since 2005. His access is in the right forearm. He dialyzes at the [facility] center on Mondays Wednesdays and Fridays.  The patient has secondary hyperparathyroidism. Intact PTH levels range from 1200 to nearly 1700. Calcium times phosphorus product is elevated at 62. Patient has been on Sensipar but has not been completely compliant with taking this medication do to gastrointestinal symptoms. Patient is now referred for consideration for total parathyroidectomy with autotransplantation.  Patient has had no prior surgery on the neck. He has no history of endocrine disease.  Past Medical History  Diagnosis Date  . Renal disorder   . Hyperparathyroidism   . Anemia   . Dyslipidemia   . Pancreatitis   . Morbid obesity   . Hypertension     Current Outpatient Prescriptions  Medication Sig Dispense Refill  . calcium acetate (PHOSLO) 667 MG capsule Take 3,305 mg by mouth 3 (three) times daily with meals.       . cinacalcet (SENSIPAR) 90 MG tablet Take 90 mg by mouth 2 (two) times daily.       . chlorhexidine (PERIDEX) 0.12 % solution        No current facility-administered medications for this visit.    No Known Allergies  History reviewed. No pertinent family history.  History   Social History  . Marital Status: Single    Spouse Name: N/A    Number of Children: N/A  . Years of Education: N/A   Social History Main Topics  . Smoking status: Current Every Day Smoker -- 0.50 packs/day    Types: Cigarettes  . Smokeless tobacco: None  . Alcohol Use: No  . Drug Use: No  . Sexually Active: None   Other  Topics Concern  . None   Social History Narrative  . None    REVIEW OF SYSTEMS - PERTINENT POSITIVES ONLY: Denies compressive symptoms. Denies tremor. Denies palpitations.  EXAM: Filed Vitals:   03/14/13 1039  BP: 130/72  Pulse: 68  Resp: 16    HEENT: normocephalic; pupils equal and reactive; sclerae clear; dentition good; mucous membranes moist NECK:  No palpable masses in the thyroid bed; symmetric on extension; no palpable anterior or posterior cervical lymphadenopathy; no supraclavicular masses; no tenderness CHEST: clear to auscultation bilaterally without rales, rhonchi, or wheezes CARDIAC: regular rate and rhythm without significant murmur; peripheral pulses are full EXT:  non-tender without edema; no deformity; bilateral forearm fistulae with palpable thrill NEURO: no gross focal deficits; no sign of tremor   LABORATORY RESULTS: See [facility] (CHL-Epic) for most recent results  RADIOLOGY RESULTS: See [facility] (CHL-Epic) for most recent results  IMPRESSION: #1 secondary hyperparathyroidism #2 end-stage renal disease  PLAN: I discussed the above findings at length with the patient. We reviewed his laboratory studies. I explained the procedure of total parathyroidectomy with autotransplantation to him. We discussed the procedure, the hospital stay, and his postoperative recovery. We discussed the possibility of recurrent disease in need for additional surgery. He understands and wishes to proceed. We will make arrangements and coordinate with his nephrologist for surgery in the near future.  The risks and benefits of the procedure have been discussed at length  with the patient.  The patient understands the proposed procedure, potential alternative treatments, and the course of recovery to be expected.  All of the patient's questions have been answered at this time.  The patient wishes to proceed with surgery.  Earnstine Regal, MD, [facility]  Primary Care Physician: Placido Sou, MD

## 2013-03-30 NOTE — Consult Note (Signed)
[facility] Renal Consultation Note  Indication for Consultation:  Management of ESRD/hemodialysis; anemia, hypertension/volume and secondary hyperparathyroidism  HPI: [name] is a 27 y.o. male admitted post op Dr. Harlow Asa  total parathyroidectomy (3 glands) with  Autotransplantation parathyroid tissue to left brachioradialis muscle and Right thyroid lobectomy. He has a history of severe Secondary Hyperparathyroidism with iPths > 1,000 past 12 months and uncontrolled Phosphorus. Most recent labs 03/15/13 showed Calcium 8.0 and phosphorus 7.1. Now post op and stable with post op neck discomfort. Normally MWF  HD and stayed his whole tx time 4.5 hrs at [facility] yesterday with no reported op problems.    Past Medical History  Diagnosis Date  . Hyperparathyroidism   . Anemia   . Dyslipidemia   . Pancreatitis   . Morbid obesity   . Hypertension     controlled with dialysis  . Renal disorder     MWF  . GERD (gastroesophageal reflux disease)     prn Prilosec  . Arthritis   . Complication of anesthesia     A little while to wake up after knee surgery in 2008    Past Surgical History  Procedure Laterality Date  . Av fistula placement Bilateral     Uses Right arm  . Esophagogastroduodenoscopy endoscopy    . Knee arthroscopy Right 2007     History reviewed. No pertinent family history. Social= He     reports that he has been smoking Cigarettes.  He has a 2.6 pack-year smoking history. He does not have any smokeless tobacco history on file. He reports that he does not drink alcohol or use illicit drugs.  No Known Allergies  Prior to Admission medications   Medication Sig Start Date End Date Taking? Authorizing Provider  calcium acetate (PHOSLO) 667 MG capsule Take 3,335 mg by mouth 3 (three) times daily with meals.    Yes Historical Provider, MD  cinacalcet (SENSIPAR) 90 MG tablet Take 90 mg by mouth 2 (two) times daily.    Yes Historical Provider, MD  lanthanum (FOSRENOL)  500 MG chewable tablet Chew 500 mg by mouth daily after breakfast.    Yes Historical Provider, MD     Anti-infectives   Start     Dose/Rate Route Frequency Ordered Stop   03/30/13 0600  ceFAZolin (ANCEF) 3 g in dextrose 5 % 50 mL IVPB     3 g 160 mL/hr over 30 Minutes Intravenous On call to O.R. 03/29/13 1449 03/30/13 1239      Results for orders placed during the hospital encounter of 03/30/13 (from the past 48 hour(s))  POCT I-STAT 4, (NA,K, GLUC, HGB,HCT)     Status: Abnormal   Collection Time    03/30/13 12:05 PM      Result Value Range   Sodium 139  135 - 145 mEq/L   Potassium 3.6  3.5 - 5.1 mEq/L   Glucose, Bld 105 (*) 70 - 99 mg/dL   HCT 30.0 (*) 39.0 - 52.0 %   Hemoglobin 10.2 (*) 13.0 - 17.0 g/dL     ROS: Denies fevers, chills, sob, chest pain, N/V.and no other positives reported in PACU to me.   Physical Exam: Filed Vitals:   03/30/13 1547  BP: 134/55  Pulse: 90  Temp: 99.8 F (37.7 C)  Resp: 8     General: Alert, obese, BM, NAD, appropriate some upper airway noises/wheezing from the mouth area, in pain, can't swallow HEENT: [place], mmm Eyes:  eomi Neck:  Supple, no jvd  Heart: RRR, 1/6 sem lsb , no rub   Lungs: CTA anteriorly , could not move neck to listen posteriorly Abdomen: obese, bs pos, soft, nontender Extremities: No pedal edema Skin: neck and Left forearm surgical bandages dry / clean Neuro: alert, oX3 , moves all ext Dialysis Access: pos. Bruit R FA AVF  Dialysis Orders:  [facility] on MWF  EDW 125kg     Bath 2/2.25Ca  4hr 50min     Heparin 1200     RFA AVF   F200 Epogen 1200   Hectorol / Venofer  none  Assessment/Plan 1. Secondary Hyperparathyroidism sp total parathyroidectomy with autotransplant  glandtissue To Left forearm/ also Right Thyroid Lobectomy= fu Renal Panel in Pacu and bid , use added ca hd bath / dc Fosrenol use Ca Carbonate for binder/  Vit D on HD/ WILL NEED TO GET TSH in 4 to 6 [place] with Thyroid surgery does not have serum Ca or phos on  chart yet, stat renal panel ordered; keeping Ca up may be a problem as he is having severe pain with attempts to swallow post-op, will get q 6hr lab to start and use IV Ca as needed to keep Ca over 7.5 if he cannot take liquid CaCO3. May need to be watched on SDU, will d/w surgery 2. ESRD -  MWF ( [place]) hd. No heparin in am 3. Hypertension/volume  - hd for vol  4. Anemia  - HGB pre op 10.2 on istat fu in am pre hd 5. Metabolic bone disease -   See 1. 6. Nutrition - high protein renal and Vitamin 7. Obesity-  [name], [name] [facility] Beeper [phone] 03/30/2013, 4:15 PM   Patient seen and examined.  I agree with assessment and plan as above with additions as indicated.  Kelly Splinter  MD Pager [phone]    Cell  [phone] 03/30/2013, 5:40 PM

## 2013-03-30 NOTE — Progress Notes (Signed)
Care of pt assumed by MA Therma Lasure RN 

## 2013-03-30 NOTE — Brief Op Note (Signed)
03/30/2013  3:56 PM  PATIENT:  [name]  27 y.o. male  PRE-OPERATIVE DIAGNOSIS:  Secondary hyperparathyroidism, ESRD  POST-OPERATIVE DIAGNOSIS:  same  PROCEDURE:  1.  Neck exploration with total parathyroidectomy (3 glands)  2. Autotransplantation parathyroid tissue to left brachioradialis muscle  3. Right thyroid lobectomy  SURGEON:  Surgeon(s) and Role:    * Earnstine Regal, MD - Primary    * Imogene Burn. Georgette Dover, MD - Assisting  ANESTHESIA:   general  EBL:  Total I/O In: 700 [I.V.:700] Out: 40 [Blood:40]  BLOOD ADMINISTERED:none  DRAINS: none   LOCAL MEDICATIONS USED:  NONE  SPECIMEN:  Excision  DISPOSITION OF SPECIMEN:  PATHOLOGY  COUNTS:  YES  TOURNIQUET:  * No tourniquets in log *  DICTATION: .Other Dictation: Dictation Number [id number]  PLAN OF CARE: Admit to inpatient   PATIENT DISPOSITION:  PACU - hemodynamically stable.   Delay start of Pharmacological VTE agent (>24hrs) due to surgical blood loss or risk of bleeding: yes  Earnstine Regal, MD, [facility] Office: [phone]

## 2013-03-30 NOTE — Anesthesia Postprocedure Evaluation (Signed)
  Anesthesia Post-op Note  Patient: [name]  Procedure(s) Performed: Procedure(s) with comments: TOTAL PARATHYROIDECTOMY WITH AUTOTRANSPLANT (N/A) - Autotransplant to left lower arm.  Patient Location: PACU  Anesthesia Type:General  Level of Consciousness: awake, alert  and oriented  Airway and Oxygen Therapy: Patient Spontanous Breathing and Patient connected to nasal cannula oxygen  Post-op Pain: mild  Post-op Assessment: Post-op Vital signs reviewed, Patient's Cardiovascular Status Stable, Respiratory Function Stable, Patent Airway and Pain level controlled  Post-op Vital Signs: stable  Complications: No apparent anesthesia complications

## 2013-03-30 NOTE — Preoperative (Signed)
Beta Blockers   Reason not to administer Beta Blockers:Not Applicable 

## 2013-03-31 ENCOUNTER — Encounter (HOSPITAL_COMMUNITY): Payer: Self-pay | Admitting: Surgery

## 2013-03-31 LAB — CBC
Hemoglobin: 9.7 g/dL — ABNORMAL LOW (ref 13.0–17.0)
MCHC: 34.4 g/dL (ref 30.0–36.0)
Platelets: 224 10*3/uL (ref 150–400)
RBC: 3.27 MIL/uL — ABNORMAL LOW (ref 4.22–5.81)

## 2013-03-31 LAB — RENAL FUNCTION PANEL
GFR calc Af Amer: 6 mL/min — ABNORMAL LOW (ref >90)
GFR calc non Af Amer: 5 mL/min — ABNORMAL LOW (ref >90)
Glucose, Bld: 98 mg/dL (ref 70–99)
Phosphorus: 4.8 mg/dL — ABNORMAL HIGH (ref 2.3–4.6)
Potassium: 4 mEq/L (ref 3.5–5.1)
Sodium: 137 mEq/L (ref 135–145)

## 2013-03-31 LAB — CALCIUM
Calcium: 6.5 mg/dL — ABNORMAL LOW (ref 8.4–10.5)
Calcium: 6.4 mg/dL — CL (ref 8.4–10.5)

## 2013-03-31 MED ORDER — HYDROMORPHONE HCL PF 1 MG/ML IJ SOLN
INTRAMUSCULAR | Status: AC
  Administered 2013-03-31: 1 mg via INTRAVENOUS
  Filled 2013-03-31: qty 1

## 2013-03-31 MED ORDER — SODIUM CHLORIDE 0.9 % IV SOLN
4.0000 g | Freq: Once | INTRAVENOUS | Status: AC
  Administered 2013-03-31: 4 g via INTRAVENOUS
  Filled 2013-03-31: qty 40

## 2013-03-31 MED ORDER — SODIUM CHLORIDE 0.9 % IV SOLN
2.0000 g | Freq: Once | INTRAVENOUS | Status: AC
  Administered 2013-03-31: 2 g via INTRAVENOUS
  Filled 2013-03-31 (×2): qty 20

## 2013-03-31 MED ORDER — DOXERCALCIFEROL 4 MCG/2ML IV SOLN
INTRAVENOUS | Status: AC
  Administered 2013-03-31: 2 ug via INTRAVENOUS
  Filled 2013-03-31: qty 2

## 2013-03-31 NOTE — Progress Notes (Signed)
Patient ID: [name], male   DOB: 11-14-1985, 27 y.o.   MRN: [id number]  General Surgery - [facility] - Progress Note  POD# 1  Subjective: Patient in stepdown unit at request of renal service.  Complains of neck soreness.  Respiratory and hemodynamic status stable.  Tolerating clear liquid diet.  Objective: Vital signs in last 24 hours: Temp:  [97.9 F (36.6 C)-99.8 F (37.7 C)] 98.3 F (36.8 C) (08/29 0400) Pulse Rate:  [74-91] 90 (08/29 0400) Resp:  [10-32] 19 (08/29 0400) BP: (111-166)/(45-68) 121/45 mmHg (08/29 0746) SpO2:  [93 %-100 %] 93 % (08/29 0400) Weight:  [279 lb 1.6 oz (126.6 kg)-284 lb 2.8 oz (128.9 kg)] 284 lb 2.8 oz (128.9 kg) (08/29 0500) Last BM Date: 03/23/13  Intake/Output from previous day: 08/28 0701 - 08/29 0700 In: 945 [P.O.:120; I.V.:825] Out: 40 [Blood:40]  Exam: HEENT - clear, not icteric Neck - soft, incision clear and dry, mild soft tissue swelling; no hematoma; voice soft but normal Chest - clear bilaterally Cor - RRR, no murmur Abd - soft, obese Ext - no significant edema Neuro - grossly intact, no focal deficits  Lab Results:   Recent Labs  03/30/13 1205  HGB 10.2*  HCT 30.0*     Recent Labs  03/30/13 1205 03/30/13 1806 03/31/13 0400  NA 139 138  --   K 3.6 3.8  --   CL  --  95*  --   CO2  --  26  --   GLUCOSE 105* 96  --   BUN  --  29*  --   CREATININE  --  8.48*  --   CALCIUM  --  7.3*  7.5* 6.8*    Studies/Results: No results found.  Assessment / Plan: 1.  Status post total parathyroidectomy with autotransplant, right thyroid lobectomy  Nice fall in calcium level overnight - ? All parathyroid tissue removed  For hemodialysis today per renal service  OK from surgical standpoint to transfer to floor bed - renal to decide later this morning  Monitor labs - home when pain controlled and stable from renal standpoint  Dressings removed at bedside  Earnstine Regal, MD, [facility] Office: [phone]  03/31/2013

## 2013-03-31 NOTE — Progress Notes (Signed)
Renal Service Daily Progress Note  Subjective: Throat still sore and painful but was able to drink liquid CaCO3 this am  Physical Exam:  Blood pressure 121/45, pulse 90, temperature 98.7 F (37.1 C), temperature source Oral, resp. rate 19, height 5\' 6"  (1.676 m), weight 128.9 kg (284 lb 2.8 oz), SpO2 93.00%. Gen: more alert, no distress, no stridor  Neck: Supple, no jvd  Heart: RRR, 1/6 sem lsb , no rub  Lungs: clear bilat Abd: obese, bs pos, soft, nontender  Ext: no LE edema  Neuro: alert, oX3 , moves all ext  Access: patent RFA AVF   Dialysis Orders:  MWF @ [facility] EDW 125kg    Bath 2/2.25Ca    4hr 65min    Heparin 1200    RFA AVF F200  Epogen 1200    Hectorol / Venofer none    Assessment: 1. Sec HPT, s/p parathyroidectomy w autotransplant L forearm- stopped binder, using added Ca++ HD bath, started CaCO3 1500mg  elemental Ca tid between meals and Hectorol 2ug w HD. Ca down to 6.8 this am, will give 2gm IV Ca++ x 1 2. R thyroid lobectomy-- WILL NEED TO GET TSH in 4 to 6 wks 3. HTN/volume- up 4kg 4. Anemia - HGB pre op 10.2, hold ESA for now   Plan- HD today , high Ca bath, CaCO3 between meals, frequent labs to follow Scot Jun  MD Pager [phone]    Cell  [phone] 03/31/2013, 9:41 AM    Recent Labs Lab 03/30/13 1205 03/30/13 1806 03/31/13 0400  NA 139 138  --   K 3.6 3.8  --   CL  --  95*  --   CO2  --  26  --   GLUCOSE 105* 96  --   BUN  --  29*  --   CREATININE  --  8.48*  --   CALCIUM  --  7.3*  7.5* 6.8*  PHOS  --  4.1  --     Recent Labs Lab 03/30/13 1806  ALBUMIN 3.7    Recent Labs Lab 03/30/13 1205  HGB 10.2*  HCT 30.0*   . calcium carbonate (dosed in mg elemental calcium)  1,500 mg of elemental calcium Oral TID BM  . doxercalciferol  2 mcg Intravenous Q M,W,F-HD   . sodium chloride 20 mL/hr at 03/30/13 1155  . sodium chloride     acetaminophen, HYDROcodone-acetaminophen, HYDROmorphone (DILAUDID) injection, ondansetron (ZOFRAN) IV,  ondansetron

## 2013-03-31 NOTE — Progress Notes (Signed)
Utilization review completed.  

## 2013-03-31 NOTE — Progress Notes (Signed)
Ca still low at 6.4 this afternoon.  Took morning dose of CaCO3 but not the afternoon dose, prob due to swallowing difficulty.  Will give IV Ca bolus x 4gm.    Kelly Splinter  MD Pager [phone]    Cell  [phone] 03/31/2013, 8:43 PM

## 2013-03-31 NOTE — Progress Notes (Signed)
Late entry:  Patient arrived via bed from hemodialysis.  Report received from dialysis nurse and stepdown nurse.  Patient's only complaint at time of arrival is incisional pain.  Vital signs stable.  Patient is alert and oriented without any signs of respiratory/cardiac distress.  Patient medicated for pain and acclimated to room.

## 2013-03-31 NOTE — Op Note (Signed)
[name], [name]                ACCOUNT NO.:  [id number]  MEDICAL RECORD NO.:  [id number]  LOCATION:  3S08C                        FACILITY:  [facility]  PHYSICIAN:  Earnstine Regal, MD      DATE OF BIRTH:  10-27-1985  DATE OF PROCEDURE:  03/30/2013                               OPERATIVE REPORT   PREOPERATIVE DIAGNOSIS:  Secondary hyperparathyroidism, end-stage renal disease.  POSTOPERATIVE DIAGNOSIS:  Secondary hyperparathyroidism, end-stage renal disease.  PROCEDURE: 1. Neck exploration with total parathyroidectomy (3 glands). 2. Autotransplantation parathyroid tissue, left brachioradialis     muscle. 3. Right thyroid lobectomy.  SURGEON:  Earnstine Regal, MD, FACS  ASSISTANT:  Imogene Burn. Georgette Dover, MD, FACS  ANESTHESIA:  General.  ESTIMATED BLOOD LOSS:  Minimal.  PREPARATION:  ChloraPrep.  COMPLICATIONS:  None.  FINDINGS:  Three abnormal parathyroid glands were removed and confirmed on frozen section.  The second parathyroid gland from the right side of the neck was not identified.  The gland removed from the right neck was abnormally located in an anterior and central position.  This may have represented an anatomic variant and may have been composed of both right sided glands.  Final pathology pending.  Right thyroid lobectomy performed since second gland never identified.  INDICATIONS:  The patient is a 27 year old male referred by his nephrologist, Dr. Jeneen Rinks Deterding, for evaluation and management of secondary hyperparathyroidism.  The patient has been on hemodialysis since 2005.  He dialyzed at [facility] on Mondays, Wednesdays, and Fridays.  The patient has biochemical evidence of secondary hyperparathyroidism with intact PTH levels ranging from 1200-1700. Calcium times phosphorus product was elevated at 62.  The patient has been poorly compliant with Sensipar due to gastrointestinal side effects.  The patient now comes to Surgery for total parathyroidectomy.  BODY  OF REPORT:  Procedures done in OR #9 at the [facility].  The patient was brought to the operating room, placed in supine position on the operating room table.  Following administration of general anesthesia, the patient was positioned and then prepped and draped in the usual strict aseptic fashion.  After ascertaining that an adequate level of anesthesia been achieved, a Kocher incision was made with a #15 blade.  Dissection was carried through subcutaneous tissues. Platysma was divided with the electrocautery.  Skin flaps were elevated, cephalad, and caudad from the thyroid notch to the sternal notch.  A Mahorner self-retaining retractor was placed for exposure.  Strap muscles were incised in the midline.  Dissection was begun on the left side.  Strap muscles were reflected laterally exposing the left thyroid lobe.  Venous tributaries were divided between Ligaclips.  Exploration revealed an enlarged parathyroid gland in the left inferior position. This was gently mobilized.  It appears somewhat inflammatory and adherent to the surrounding tissues.  It was gently dissected out. Vascular tributaries were divided between small Ligaclips and the entire gland was excised.  A fragment of the gland was submitted to Pathology where frozen section by Dr. Enid Cutter confirms parathyroid tissue. Remainder of the gland was placed in iced saline on the back table.  Further dissection on the left side reveals an enlarged superior  parathyroid gland located posterior to the superior pole.  This was gently dissected out.  Again vascular structures were divided between small Ligaclips.  The entire gland was excised.  A fragment of the gland was submitted to pathology as biopsy and confirmed hypercellular parathyroid tissue.  Remainder of the gland was placed in iced saline on the back table.  Dry pack was placed in the left neck.  Next, we turned our attention to the right thyroid  lobe.  Again strap muscles were reflected laterally.  Right lobe was mobilized.  Venous tributaries were divided between Ligaclips with the electrocautery.  On the anterior surface of the right thyroid lobe, there was an enlarged parathyroid gland.  This measures close to 2 cm in size.  A fragment is excised as biopsy and submitted to pathology which confirms parathyroid tissue.  Remainder is placed in iced saline on the back table.  Further dissection does not reveal an additional parathyroid gland. Dissection is extensive allowing for complete mobilization of the entire right thyroid lobe.  A fragment of suspicious tissue adjacent to the inferior thyroid artery was resected and submitted to pathology. Unfortunately, biopsy confirms thyroid tissue.  Further dissection allows for opening of the precervical space.  Dissection was carried behind the esophagus.  The carotid sheath was opened and explored.  The thyrothymic tract on the right was explored and excised.  No evidence of parathyroid tissue was identified after extensive dissection.  Decision was made to proceed with right thyroid lobectomy.  Superior pole vessels were divided between medium Ligaclips with the electrocautery.  Inferior venous tributaries were divided between Ligaclips with the electrocautery.  Branches of the inferior thyroid artery are divided between small Ligaclips with electrocautery.  Care was taken to avoid the recurrent laryngeal nerve.  Ligament of Gwenlyn Found was released with the electrocautery and the gland was mobilized onto the anterior trachea.  Isthmus was mobilized across the midline.  Isthmus was transected at its junction with the left thyroid lobe between hemostats with the electrocautery.  The edges of the left thyroid lobe was suture ligated with 3-0 Vicryl suture ligatures.  Good hemostasis was noted. Right thyroid lobe was sectioned on the table but there was no evidence of intrathyroidal  parathyroid gland identified.  The specimen was submitted to Pathology for permanent review.  Right neck was irrigated with warm saline.  Fibrillar was placed throughout the operative field.  The Fibrillar was placed in the left thyroid bed along the left thyroid lobe.  Strap muscles were reapproximated in the midline with interrupted 3-0 Vicryl sutures. Platysma was closed with interrupted 3-0 Vicryl sutures.  Skin was closed with a running 4-0 Monocryl subcuticular suture.  Wound was washed and dried, and benzoin and Steri-Strips were applied.  Sterile dressings were applied.  Next, the left arm was placed on an arm board.  After positioning, it was prepped and draped in the usual aseptic fashion.  After ascertaining that an adequate level of anesthesia had been maintained, an incision was made over the left brachioradialis muscle.  Skin flaps were developed circumferentially using the electrocautery for hemostasis.  A Weitlaner retractor was placed for exposure.  The left superior parathyroid gland was then sectioned into eight 1 mm fragments.  The remainder of the gland was submitted to pathology.  These eight 1 mm fragments were then implanted into the left brachioradialis muscle. This was performed by incising the muscle fascia with a #15 blade, creating a muscular pocket with a mosquito hemostat, inserting a  1-mm fragment, and closing the overlying muscle fascia with interrupted 4-0 Prolene simple sutures.  This exercises repeated 8 times.  Subcutaneous tissues were then closed with interrupted 3-0 Vicryl sutures.  Skin was closed with a running 4-0 Monocryl subcuticular suture.  Wound was washed and dried and, benzoin and Steri-Strips were applied.  Sterile dressings were applied.  The patient was awakened from anesthesia and brought to the recovery room.  The patient tolerated the procedure well.   Earnstine Regal, MD, [facility] Office: [phone]   TMG/MEDQ  D:  03/30/2013  T:  03/31/2013  Job:  [id number]  cc:   Jeneen Rinks L. Deterding, M.D.

## 2013-04-01 LAB — RENAL FUNCTION PANEL
Albumin: 3.7 g/dL (ref 3.5–5.2)
Albumin: 3.3 g/dL — ABNORMAL LOW (ref 3.5–5.2)
BUN: 28 mg/dL — ABNORMAL HIGH (ref 6–23)
Calcium: 10.8 mg/dL — ABNORMAL HIGH (ref 8.4–10.5)
Calcium: 8.9 mg/dL (ref 8.4–10.5)
Chloride: 93 mEq/L — ABNORMAL LOW (ref 96–112)
Creatinine, Ser: 7.69 mg/dL — ABNORMAL HIGH (ref 0.50–1.35)
Creatinine, Ser: 9.48 mg/dL — ABNORMAL HIGH (ref 0.50–1.35)
GFR calc non Af Amer: 9 mL/min — ABNORMAL LOW (ref >90)
Glucose, Bld: 109 mg/dL — ABNORMAL HIGH (ref 70–99)
Phosphorus: 5.3 mg/dL — ABNORMAL HIGH (ref 2.3–4.6)
Phosphorus: 3.4 mg/dL (ref 2.3–4.6)
Potassium: 3.9 mEq/L (ref 3.5–5.1)

## 2013-04-01 LAB — CALCIUM
Calcium: 11.8 mg/dL — ABNORMAL HIGH (ref 8.4–10.5)
Calcium: 10.9 mg/dL — ABNORMAL HIGH (ref 8.4–10.5)

## 2013-04-01 MED ORDER — PHENOL 1.4 % MT LIQD
1.0000 | OROMUCOSAL | Status: DC | PRN
  Administered 2013-04-01: 1 via OROMUCOSAL
  Filled 2013-04-01: qty 177

## 2013-04-01 MED ORDER — DARBEPOETIN ALFA-POLYSORBATE 40 MCG/0.4ML IJ SOLN
40.0000 ug | INTRAMUSCULAR | Status: DC
  Administered 2013-04-03: 40 ug via INTRAVENOUS
  Filled 2013-04-01: qty 0.4

## 2013-04-01 MED ORDER — RENA-VITE PO TABS
1.0000 | ORAL_TABLET | Freq: Every day | ORAL | Status: DC
  Administered 2013-04-01 – 2013-04-02 (×2): 1 via ORAL
  Filled 2013-04-01 (×3): qty 1

## 2013-04-01 MED ORDER — CALCIUM CARBONATE 1250 MG/5ML PO SUSP
1000.0000 mg | Freq: Three times a day (TID) | ORAL | Status: DC
  Administered 2013-04-01 – 2013-04-02 (×3): 1000 mg via ORAL
  Filled 2013-04-01 (×3): qty 10

## 2013-04-01 NOTE — Progress Notes (Signed)
Patient ID: [name], male   DOB: 08-25-1985, 27 y.o.   MRN: [id number] [facility] Surgery Progress Note:   2 Days Post-Op  Problem List: Patient Active Problem List   Diagnosis Date Noted  . Hyperparathyroidism, secondary 03/14/2013   Subjective:  No complaints but indicating that he is not ready for discharge Objective: Vital signs in last 24 hours: Temp:  [97.2 F (36.2 C)-100.6 F (38.1 C)] 98.3 F (36.8 C) (08/30 0938) Pulse Rate:  [64-112] 112 (08/30 0938) Resp:  [11-22] 20 (08/30 0938) BP: (102-166)/(52-90) 134/72 mmHg (08/30 0938) SpO2:  [93 %-100 %] 95 % (08/30 0938) Weight:  [276 lb 0.3 oz (125.2 kg)-283 lb 15.2 oz (128.8 kg)] 276 lb 14.4 oz (125.6 kg) (08/30 0500) Physical Exam: incision in neck is OK with steri strips in place;  Left arm bandgaged where implant are located.  Lab Results:  Results for orders placed during the hospital encounter of 03/30/13 (from the past 48 hour(s))  POCT I-STAT 4, (NA,K, GLUC, HGB,HCT)     Status: Abnormal   Collection Time    03/30/13 12:05 PM      Result Value Range   Sodium 139  135 - 145 mEq/L   Potassium 3.6  3.5 - 5.1 mEq/L   Glucose, Bld 105 (*) 70 - 99 mg/dL   HCT 30.0 (*) 39.0 - 52.0 %   Hemoglobin 10.2 (*) 13.0 - 17.0 g/dL  RENAL FUNCTION PANEL     Status: Abnormal   Collection Time    03/30/13  6:06 PM      Result Value Range   Sodium 138  135 - 145 mEq/L   Potassium 3.8  3.5 - 5.1 mEq/L   Chloride 95 (*) 96 - 112 mEq/L   CO2 26  19 - 32 mEq/L   Glucose, Bld 96  70 - 99 mg/dL   BUN 29 (*) 6 - 23 mg/dL   Creatinine, Ser 8.48 (*) 0.50 - 1.35 mg/dL   Calcium 7.3 (*) 8.4 - 10.5 mg/dL   Phosphorus 4.1  2.3 - 4.6 mg/dL   Albumin 3.7  3.5 - 5.2 g/dL   GFR calc non Af Amer 8 (*) >90 mL/min   GFR calc Af Amer 9 (*) >90 mL/min   Comment: (NOTE)     The eGFR has been calculated using the CKD EPI equation.     This calculation has not been validated in all clinical situations.     eGFR's persistently <90 mL/min  signify possible Chronic Kidney     Disease.  CALCIUM     Status: Abnormal   Collection Time    03/30/13  6:06 PM      Result Value Range   Calcium 7.5 (*) 8.4 - 10.5 mg/dL  CALCIUM     Status: Abnormal   Collection Time    03/31/13  4:00 AM      Result Value Range   Calcium 6.8 (*) 8.4 - 10.5 mg/dL  CALCIUM     Status: Abnormal   Collection Time    03/31/13 11:30 AM      Result Value Range   Calcium 6.5 (*) 8.4 - 10.5 mg/dL  CALCIUM     Status: Abnormal   Collection Time    03/31/13  2:30 PM      Result Value Range   Calcium 6.4 (*) 8.4 - 10.5 mg/dL   Comment: CRITICAL RESULT CALLED TO, READ BACK BY AND VERIFIED WITH:     R. WOFFORD (RN) [phone]  03/31/2013 L. LOMAX  CBC     Status: Abnormal   Collection Time    03/31/13  2:30 PM      Result Value Range   WBC 10.7 (*) 4.0 - 10.5 K/uL   RBC 3.27 (*) 4.22 - 5.81 MIL/uL   Hemoglobin 9.7 (*) 13.0 - 17.0 g/dL   HCT 28.2 (*) 39.0 - 52.0 %   MCV 86.2  78.0 - 100.0 fL   MCH 29.7  26.0 - 34.0 pg   MCHC 34.4  30.0 - 36.0 g/dL   RDW 13.0  11.5 - 15.5 %   Platelets 224  150 - 400 K/uL  RENAL FUNCTION PANEL     Status: Abnormal   Collection Time    03/31/13  2:30 PM      Result Value Range   Sodium 137  135 - 145 mEq/L   Potassium 4.0  3.5 - 5.1 mEq/L   Chloride 93 (*) 96 - 112 mEq/L   CO2 26  19 - 32 mEq/L   Glucose, Bld 98  70 - 99 mg/dL   BUN 37 (*) 6 - 23 mg/dL   Creatinine, Ser 11.87 (*) 0.50 - 1.35 mg/dL   Calcium 6.4 (*) 8.4 - 10.5 mg/dL   Comment: CRITICAL RESULT CALLED TO, READ BACK BY AND VERIFIED WITH:      R. WOFFORD (RN) 1505 03/31/2013 L. LOMAX   Phosphorus 4.8 (*) 2.3 - 4.6 mg/dL   Albumin 3.5  3.5 - 5.2 g/dL   GFR calc non Af Amer 5 (*) >90 mL/min   GFR calc Af Amer 6 (*) >90 mL/min   Comment: (NOTE)     The eGFR has been calculated using the CKD EPI equation.     This calculation has not been validated in all clinical situations.     eGFR's persistently <90 mL/min signify possible Chronic Kidney     Disease.   CALCIUM     Status: Abnormal   Collection Time    04/01/13 12:05 AM      Result Value Range   Calcium 11.8 (*) 8.4 - 10.5 mg/dL  CALCIUM     Status: Abnormal   Collection Time    04/01/13  4:00 AM      Result Value Range   Calcium 10.9 (*) 8.4 - 10.5 mg/dL   Radiology/Results: No results found. Assessment/Plan: Problem List: Patient Active Problem List   Diagnosis Date Noted  . Hyperparathyroidism, secondary 03/14/2013   Stable postop from neck exploration for secondary hyperparathyroidism.  Will defer to renal on discharge date.  2 Days Post-Op   LOS: 2 days   Matt B. Hassell Done, MD, [facility] [phone] beeper [phone]  04/01/2013 10:20 AM

## 2013-04-01 NOTE — Progress Notes (Signed)
Subjective:  Co continued  throat/ neck discomfort / but able to tolerate liquid Breakfast Objective Vital signs in last 24 hours: Filed Vitals:   04/01/13 0347 04/01/13 0500 04/01/13 0516 04/01/13 0938  BP: 147/90  127/86 134/72  Pulse: 104  102 112  Temp: 99.2 F (37.3 C)   98.3 F (36.8 C)  TempSrc: Oral     Resp: 18  18 20   Height:      Weight:  125.6 kg (276 lb 14.4 oz)    SpO2: 94%  95% 95%   Weight change: 2.2 kg (4 lb 13.6 oz)  Intake/Output Summary (Last 24 hours) at 04/01/13 1029 Last data filed at 04/01/13 0939  Gross per 24 hour  Intake    900 ml  Output   3800 ml  Net  -2900 ml   Labs: Basic Metabolic Panel:  Recent Labs Lab 03/30/13 1205 03/30/13 1806  03/31/13 1430 04/01/13 0005 04/01/13 0400  NA 139 138  --  137  --   --   K 3.6 3.8  --  4.0  --   --   CL  --  95*  --  93*  --   --   CO2  --  26  --  26  --   --   GLUCOSE 105* 96  --  98  --   --   BUN  --  29*  --  37*  --   --   CREATININE  --  8.48*  --  11.87*  --   --   CALCIUM  --  7.3*  7.5*  < > 6.4*  6.4* 11.8* 10.9*  PHOS  --  4.1  --  4.8*  --   --   < > = values in this interval not displayed.  Recent Labs Lab 03/30/13 1806 03/31/13 1430  ALBUMIN 3.7 3.5  CBC:  Recent Labs Lab 03/30/13 1205 03/31/13 1430  WBC  --  10.7*  HGB 10.2* 9.7*  HCT 30.0* 28.2*  MCV  --  86.2  PLT  --  224   Medications: . sodium chloride 20 mL/hr at 03/31/13 1900   . calcium carbonate (dosed in mg elemental calcium)  1,500 mg of elemental calcium Oral TID BM  . doxercalciferol  2 mcg Intravenous Q M,W,F-HD  .  Physical Exam: General: Alert ,obese Young BM NAD Heart: RRR, 1/6 sem lsb , no rub  Lungs: sl decreased at bases otherwise  clear bilat  Abd: obese, bs pos, soft, nontender  Ext: no LE edema   Access: patent RFA AVF   Dialysis Orders: MWF @ [facility]  EDW 125kg Bath 2/2.25Ca 4hr 71min Heparin 1200 RFA AVF F200  Epogen 1200 Hectorol / Venofer none   Assessment:  1. Sec HPT, s/p  parathyroidectomy w autotransplant L forearm-  Ca improved after 2gmCa++ IV  last pm 6.4>6.4> 11.8> 10.9 stopped Fosrenol binder, using added Ca++ HD bath, started CaCO3 1500mg  elemental Ca tid between meals and Hectorol 2ug w HD. Fu afternoon Ca and needs to swallow pos before dc. 2. R thyroid lobectomy-- WILL NEED TO GET TSH in 4 to 6 wks 3. ESRD- MWF HD ([facility]) 4. HTN/volume- wt now .6 > edw and bp stable with out meds 5. Anemia - HGB pre op 10.2,>9.7 holding ESA . Restart low dose 85mcg monday hd if here  [name], [name] [place] [phone] 04/01/2013,10:29 AM  LOS: 2 days   Patient seen and examined.  Agree  with assessment and plan as above.  Wil decrease po CaCO3 to 1 gm tid elemental Ca. Home when swallowing and Ca stable. Kelly Splinter  MD Pager [phone]    Cell  [phone] 04/01/2013, 11:03 AM

## 2013-04-02 LAB — RENAL FUNCTION PANEL
Albumin: 3.4 g/dL — ABNORMAL LOW (ref 3.5–5.2)
BUN: 35 mg/dL — ABNORMAL HIGH (ref 6–23)
CO2: 23 mEq/L (ref 19–32)
Calcium: 7.9 mg/dL — ABNORMAL LOW (ref 8.4–10.5)
Chloride: 91 mEq/L — ABNORMAL LOW (ref 96–112)
Creatinine, Ser: 11.68 mg/dL — ABNORMAL HIGH (ref 0.50–1.35)
Creatinine, Ser: 12.33 mg/dL — ABNORMAL HIGH (ref 0.50–1.35)
GFR calc non Af Amer: 5 mL/min — ABNORMAL LOW (ref >90)
Glucose, Bld: 94 mg/dL (ref 70–99)
Phosphorus: 3.6 mg/dL (ref 2.3–4.6)
Phosphorus: 3.4 mg/dL (ref 2.3–4.6)
Potassium: 4.2 mEq/L (ref 3.5–5.1)

## 2013-04-02 MED ORDER — DOXERCALCIFEROL 4 MCG/2ML IV SOLN
4.0000 ug | INTRAVENOUS | Status: DC
  Administered 2013-04-03: 4 ug via INTRAVENOUS
  Filled 2013-04-02: qty 2

## 2013-04-02 MED ORDER — CALCIUM CARBONATE 1250 MG/5ML PO SUSP
1500.0000 mg | Freq: Three times a day (TID) | ORAL | Status: DC
  Administered 2013-04-02 – 2013-04-03 (×5): 1500 mg via ORAL
  Filled 2013-04-02 (×4): qty 15

## 2013-04-02 NOTE — Progress Notes (Signed)
Patient ID: [name], male   DOB: 1986/06/05, 27 y.o.   MRN: [id number] [facility] Surgery Progress Note:   3 Days Post-Op  Subjective: Mental status is clear.  Complaining of more neck pain today.  Incision is not red.  Steristrips in place and it does not seem more swollen than yesterday.  Voice OK Objective: Vital signs in last 24 hours: Temp:  [98.3 F (36.8 C)-98.9 F (37.2 C)] 98.6 F (37 C) (08/31 0848) Pulse Rate:  [87-101] 90 (08/31 0848) Resp:  [18] 18 (08/31 0848) BP: (106-124)/(65-72) 108/65 mmHg (08/31 0848) SpO2:  [94 %-98 %] 94 % (08/31 0848) Weight:  [276 lb 14.4 oz (125.601 kg)] 276 lb 14.4 oz (125.601 kg) (08/31 0500)  Intake/Output from previous day: 08/30 0701 - 08/31 0700 In: 1615.7 [P.O.:1100; I.V.:515.7] Out: -  Intake/Output this shift:    Physical Exam: Work of breathing is normal.  Arm is wrapped   Lab Results:  Results for orders placed during the hospital encounter of 03/30/13 (from the past 48 hour(s))  CALCIUM     Status: Abnormal   Collection Time    03/31/13 11:30 AM      Result Value Range   Calcium 6.5 (*) 8.4 - 10.5 mg/dL  CALCIUM     Status: Abnormal   Collection Time    03/31/13  2:30 PM      Result Value Range   Calcium 6.4 (*) 8.4 - 10.5 mg/dL   Comment: CRITICAL RESULT CALLED TO, READ BACK BY AND VERIFIED WITH:     R. WOFFORD (RN) 1505 03/31/2013 L. LOMAX  CBC     Status: Abnormal   Collection Time    03/31/13  2:30 PM      Result Value Range   WBC 10.7 (*) 4.0 - 10.5 K/uL   RBC 3.27 (*) 4.22 - 5.81 MIL/uL   Hemoglobin 9.7 (*) 13.0 - 17.0 g/dL   HCT 28.2 (*) 39.0 - 52.0 %   MCV 86.2  78.0 - 100.0 fL   MCH 29.7  26.0 - 34.0 pg   MCHC 34.4  30.0 - 36.0 g/dL   RDW 13.0  11.5 - 15.5 %   Platelets 224  150 - 400 K/uL  RENAL FUNCTION PANEL     Status: Abnormal   Collection Time    03/31/13  2:30 PM      Result Value Range   Sodium 137  135 - 145 mEq/L   Potassium 4.0  3.5 - 5.1 mEq/L   Chloride 93 (*) 96 - 112 mEq/L   CO2 26  19 - 32 mEq/L   Glucose, Bld 98  70 - 99 mg/dL   BUN 37 (*) 6 - 23 mg/dL   Creatinine, Ser 11.87 (*) 0.50 - 1.35 mg/dL   Calcium 6.4 (*) 8.4 - 10.5 mg/dL   Comment: CRITICAL RESULT CALLED TO, READ BACK BY AND VERIFIED WITH:      R. WOFFORD (RN) 1505 03/31/2013 L. LOMAX   Phosphorus 4.8 (*) 2.3 - 4.6 mg/dL   Albumin 3.5  3.5 - 5.2 g/dL   GFR calc non Af Amer 5 (*) >90 mL/min   GFR calc Af Amer 6 (*) >90 mL/min   Comment: (NOTE)     The eGFR has been calculated using the CKD EPI equation.     This calculation has not been validated in all clinical situations.     eGFR's persistently <90 mL/min signify possible Chronic Kidney     Disease.  CALCIUM  Status: Abnormal   Collection Time    04/01/13 12:05 AM      Result Value Range   Calcium 11.8 (*) 8.4 - 10.5 mg/dL  CALCIUM     Status: Abnormal   Collection Time    04/01/13  4:00 AM      Result Value Range   Calcium 10.9 (*) 8.4 - 10.5 mg/dL  RENAL FUNCTION PANEL     Status: Abnormal   Collection Time    04/01/13  4:00 AM      Result Value Range   Sodium 137  135 - 145 mEq/L   Potassium 4.2  3.5 - 5.1 mEq/L   Chloride 93 (*) 96 - 112 mEq/L   CO2 22  19 - 32 mEq/L   Glucose, Bld 128 (*) 70 - 99 mg/dL   BUN 20  6 - 23 mg/dL   Creatinine, Ser 7.69 (*) 0.50 - 1.35 mg/dL   Comment: DELTA CHECK NOTED     DIALYSIS   Calcium 10.8 (*) 8.4 - 10.5 mg/dL   Phosphorus 5.3 (*) 2.3 - 4.6 mg/dL   Albumin 3.7  3.5 - 5.2 g/dL   GFR calc non Af Amer 9 (*) >90 mL/min   GFR calc Af Amer 10 (*) >90 mL/min   Comment: (NOTE)     The eGFR has been calculated using the CKD EPI equation.     This calculation has not been validated in all clinical situations.     eGFR's persistently <90 mL/min signify possible Chronic Kidney     Disease.  RENAL FUNCTION PANEL     Status: Abnormal   Collection Time    04/01/13  6:35 PM      Result Value Range   Sodium 135  135 - 145 mEq/L   Potassium 3.9  3.5 - 5.1 mEq/L   Chloride 93 (*) 96 - 112 mEq/L    CO2 25  19 - 32 mEq/L   Glucose, Bld 109 (*) 70 - 99 mg/dL   BUN 28 (*) 6 - 23 mg/dL   Creatinine, Ser 9.48 (*) 0.50 - 1.35 mg/dL   Calcium 8.9  8.4 - 10.5 mg/dL   Phosphorus 3.4  2.3 - 4.6 mg/dL   Albumin 3.3 (*) 3.5 - 5.2 g/dL   GFR calc non Af Amer 7 (*) >90 mL/min   GFR calc Af Amer 8 (*) >90 mL/min   Comment: (NOTE)     The eGFR has been calculated using the CKD EPI equation.     This calculation has not been validated in all clinical situations.     eGFR's persistently <90 mL/min signify possible Chronic Kidney     Disease.  RENAL FUNCTION PANEL     Status: Abnormal   Collection Time    04/02/13  8:00 AM      Result Value Range   Sodium 133 (*) 135 - 145 mEq/L   Potassium 3.8  3.5 - 5.1 mEq/L   Chloride 93 (*) 96 - 112 mEq/L   CO2 23  19 - 32 mEq/L   Glucose, Bld 94  70 - 99 mg/dL   BUN 35 (*) 6 - 23 mg/dL   Creatinine, Ser 11.68 (*) 0.50 - 1.35 mg/dL   Calcium 7.9 (*) 8.4 - 10.5 mg/dL   Phosphorus 3.6  2.3 - 4.6 mg/dL   Albumin 3.3 (*) 3.5 - 5.2 g/dL   GFR calc non Af Amer 5 (*) >90 mL/min   GFR calc Af Amer 6 (*) >90  mL/min   Comment: (NOTE)     The eGFR has been calculated using the CKD EPI equation.     This calculation has not been validated in all clinical situations.     eGFR's persistently <90 mL/min signify possible Chronic Kidney     Disease.    Radiology/Results: No results found.  Anti-infectives: Anti-infectives   Start     Dose/Rate Route Frequency Ordered Stop   03/30/13 0600  ceFAZolin (ANCEF) 3 g in dextrose 5 % 50 mL IVPB     3 g 160 mL/hr over 30 Minutes Intravenous On call to O.R. 03/29/13 1449 03/30/13 1239      Assessment/Plan: Problem List: Patient Active Problem List   Diagnosis Date Noted  . Hyperparathyroidism, secondary 03/14/2013    Having more pain and I don't think that he wants to go home.  Might as well leave him here for his HD tomorrow.   3 Days Post-Op    LOS: 3 days   Matt B. Hassell Done, MD, [facility] [phone] beeper [phone]  04/02/2013 9:55 AM

## 2013-04-02 NOTE — Progress Notes (Signed)
Subjective:  Still neck hurts and swallowing with pain, Ca down from 10.9 yest am to 7.9 this am  Objective Vital signs in last 24 hours: Filed Vitals:   04/01/13 2100 04/02/13 0449 04/02/13 0500 04/02/13 0848  BP: 121/70 116/69  108/65  Pulse: 91 87  90  Temp: 98.3 F (36.8 C) 98.9 F (37.2 C)  98.6 F (37 C)  TempSrc: Oral Oral    Resp: 18 18  18   Height: 5\' 6"  (1.676 m)     Weight: 125.601 kg (276 lb 14.4 oz)  125.601 kg (276 lb 14.4 oz)   SpO2: 97% 98%  94%   Weight change: -3.199 kg (-7 lb 0.8 oz)  Intake/Output Summary (Last 24 hours) at 04/02/13 0957 Last data filed at 04/02/13 0849  Gross per 24 hour  Intake 1015.67 ml  Output      0 ml  Net 1015.67 ml   Labs: Basic Metabolic Panel:  Recent Labs Lab 04/01/13 0400 04/01/13 1835 04/02/13 0800  NA 137 135 133*  K 4.2 3.9 3.8  CL 93* 93* 93*  CO2 22 25 23   GLUCOSE 128* 109* 94  BUN 20 28* 35*  CREATININE 7.69* 9.48* 11.68*  CALCIUM 10.9*  10.8* 8.9 7.9*  PHOS 5.3* 3.4 3.6    Recent Labs Lab 04/01/13 0400 04/01/13 1835 04/02/13 0800  ALBUMIN 3.7 3.3* 3.3*  CBC:  Recent Labs Lab 03/30/13 1205 03/31/13 1430  WBC  --  10.7*  HGB 10.2* 9.7*  HCT 30.0* 28.2*  MCV  --  86.2  PLT  --  224   Medications: . sodium chloride 20 mL/hr at 03/31/13 1900   . calcium carbonate (dosed in mg elemental calcium)  1,500 mg of elemental calcium Oral TID BM  . [START ON 04/03/2013] darbepoetin (ARANESP) injection - DIALYSIS  40 mcg Intravenous Q Mon-HD  . doxercalciferol  2 mcg Intravenous Q M,W,F-HD  . multivitamin  1 tablet Oral QHS  .  Physical Exam: General: Alert ,obese Young BM NAD Heart: RRR, 1/6 sem lsb , no rub  Lungs: sl decreased at bases otherwise  clear bilat  Abd: obese, bs pos, soft, nontender  Ext: no LE edema   Access: patent RFA AVF   Dialysis Orders: MWF @ [facility]  4.5hrs  F200   EDW 125kg   Bath 2/2.25Ca    Heparin 1200    RFA AVF Epogen 1200 Hectorol / Venofer none   Assessment:   1. Sec HPT, POD #3 parathyroidectomy w autotransplant L forearm-  Ca still dropping, from 10.9 yest am (after large dose of IV Ca) to 7.9 this am.  Continue po CaCO3 1500mg  elemental Ca tid between meals, and vit D w HD. Binders stopped. Will be ready for discharge when calcium levels stabilize 2. R thyroid lobectomy-- WILL NEED TO GET TSH in 4 to 6 wks 3. ESRD, cont MWF HD, resume heparin 1200u 4. HTN/volume- no meds at home, BP controlled, at dry wt 5. Anemia - HGB pre op 10.2,>9.7 holding ESA . Restart low dose 43mcg monday hd if here   Kelly Splinter  MD Pager [phone]    Cell  [phone] 04/02/2013, 9:57 AM

## 2013-04-03 LAB — RENAL FUNCTION PANEL
Albumin: 3.3 g/dL — ABNORMAL LOW (ref 3.5–5.2)
BUN: 48 mg/dL — ABNORMAL HIGH (ref 6–23)
Calcium: 7.4 mg/dL — ABNORMAL LOW (ref 8.4–10.5)
Phosphorus: 4.1 mg/dL (ref 2.3–4.6)
Potassium: 4.1 mEq/L (ref 3.5–5.1)

## 2013-04-03 MED ORDER — NEPRO/CARBSTEADY PO LIQD
237.0000 mL | ORAL | Status: DC | PRN
  Filled 2013-04-03: qty 237

## 2013-04-03 MED ORDER — CALCIUM CARBONATE 1250 MG/5ML PO SUSP: 1500.0000 mg | Freq: Three times a day (TID) | ORAL | Status: DC

## 2013-04-03 MED ORDER — LIDOCAINE-PRILOCAINE 2.5-2.5 % EX CREA
1.0000 "application " | TOPICAL_CREAM | CUTANEOUS | Status: DC | PRN
  Filled 2013-04-03: qty 5

## 2013-04-03 MED ORDER — CALCIUM CARBONATE 1250 MG/5ML PO SUSP: 1500.0000 mg | Freq: Four times a day (QID) | ORAL | Status: DC

## 2013-04-03 MED ORDER — ALTEPLASE 2 MG IJ SOLR
2.0000 mg | Freq: Once | INTRAMUSCULAR | Status: DC | PRN
  Filled 2013-04-03: qty 2

## 2013-04-03 MED ORDER — RENA-VITE PO TABS: 1.0000 | ORAL_TABLET | Freq: Every day | ORAL | Status: DC

## 2013-04-03 MED ORDER — PENTAFLUOROPROP-TETRAFLUOROETH EX AERO: 1.0000 "application " | INHALATION_SPRAY | CUTANEOUS | Status: DC | PRN

## 2013-04-03 MED ORDER — LIDOCAINE HCL (PF) 1 % IJ SOLN: 5.0000 mL | INTRAMUSCULAR | Status: DC | PRN

## 2013-04-03 MED ORDER — HYDROCODONE-ACETAMINOPHEN 5-325 MG PO TABS: 1.0000 | ORAL_TABLET | ORAL | Status: DC | PRN

## 2013-04-03 MED ORDER — SODIUM CHLORIDE 0.9 % IV SOLN: 100.0000 mL | INTRAVENOUS | Status: DC | PRN

## 2013-04-03 MED ORDER — HYDROMORPHONE HCL PF 1 MG/ML IJ SOLN
INTRAMUSCULAR | Status: AC
  Filled 2013-04-03: qty 1

## 2013-04-03 MED ORDER — HEPARIN SODIUM (PORCINE) 1000 UNIT/ML DIALYSIS
1200.0000 [IU] | Freq: Once | INTRAMUSCULAR | Status: AC
  Administered 2013-04-03: 1200 [IU] via INTRAVENOUS_CENTRAL

## 2013-04-03 MED ORDER — DOXERCALCIFEROL 4 MCG/2ML IV SOLN
INTRAVENOUS | Status: AC
  Filled 2013-04-03: qty 2

## 2013-04-03 MED ORDER — HEPARIN SODIUM (PORCINE) 1000 UNIT/ML DIALYSIS: 1000.0000 [IU] | INTRAMUSCULAR | Status: DC | PRN

## 2013-04-03 MED ORDER — DARBEPOETIN ALFA-POLYSORBATE 40 MCG/0.4ML IJ SOLN
INTRAMUSCULAR | Status: AC
  Filled 2013-04-03: qty 0.4

## 2013-04-03 NOTE — Progress Notes (Signed)
Discussed with Dr Marlou Starks. I am happy that [name] is discharged.  I have recommended increasing  tums 1500mg  four times a day. Increase  71mcg [name] qHD Dialyze on high calcium bath 3.5 Mmol Check calcium weekly Return to hospital if symptoms of parasthesias in hands and around mouth occur

## 2013-04-03 NOTE — Discharge Summary (Signed)
Physician Discharge Summary  Patient ID: [name] MRN: [id number] DOB/AGE: 1985/09/01 27 y.o.  Admit date: 03/30/2013 Discharge date: 04/03/2013  Admission Diagnoses: Hyperparathyroidism  Discharge Diagnoses:  S/p parathyroidectomy Principal Problem:   Hyperparathyroidism, secondary   Discharged Condition: good  Hospital Course: 27 year old BM with history of ESRD and hyperparathyroidism who presents for a parathyroidectomy.  This took place on 8/28 and postoperative course was uneventful.  He will be discharged and will be followed closely at dialysis with calcium levels.  He will stop his sensipar and fosrenol and has been placed on calcium based binders  Consults: nephrology  Significant Diagnostic Studies: labs: calcium, phos   Treatments: surgery: had parathyroidectomy  Discharge Exam: Blood pressure 165/83, pulse 78, temperature 98.5 F (36.9 C), temperature source Oral, resp. rate 18, height 5\' 6"  (1.676 m), weight 125.8 kg (277 lb 5.4 oz), SpO2 98.00%. General appearance: alert and appears stated age Resp: clear to auscultation bilaterally Cardio: regular rate and rhythm, S1, S2 normal, no murmur, click, rub or gallop GI: soft, non-tender; bowel sounds normal; no masses,  no organomegaly Extremities: extremities normal, atraumatic, no cyanosis or edema  Disposition: 01-Home or Self Care   Future Appointments Provider Department Dept Phone   04/25/2013 10:15 AM Earnstine Regal, MD [facility], [place] [phone]       Medication List    STOP taking these medications       cinacalcet 90 MG tablet  Commonly known as:  SENSIPAR     lanthanum 500 MG chewable tablet  Commonly known as:  FOSRENOL      TAKE these medications       calcium acetate 667 MG capsule  Commonly known as:  PHOSLO  Take 3,335 mg by mouth 3 (three) times daily with meals.     calcium carbonate (dosed in mg elemental calcium) 1250 MG/5ML  Take 15 mLs (1,500 mg of elemental  calcium total) by mouth 3 (three) times daily between meals.     HYDROcodone-acetaminophen 5-325 MG per tablet  Commonly known as:  NORCO/VICODIN  Take 1-2 tablets by mouth every 4 (four) hours as needed.     multivitamin Tabs tablet  Take 1 tablet by mouth at bedtime.         SignedCorliss Parish A 04/03/2013, 5:48 PM

## 2013-04-03 NOTE — Procedures (Signed)
I have seen and examined this patient and agree with the plan of care. Patient doing well on dialysis. No parasthesisa. Neck would healing well. Tongue central and no hoarseness noted. [name] W 04/03/2013, 8:33 AM

## 2013-04-03 NOTE — Progress Notes (Signed)
Patient ID: [name], male   DOB: Oct 10, 1985, 27 y.o.   MRN: [id number] [facility] Surgery Progress Note:   4 Days Post-Op  Subjective: Mental status is groggy.  Pt receiving HD.  Same complaints of neck soreness Objective: Vital signs in last 24 hours: Temp:  [98.2 F (36.8 C)-98.7 F (37.1 C)] 98.2 F (36.8 C) (09/01 0755) Pulse Rate:  [71-108] 81 (09/01 1030) Resp:  [17-18] 18 (09/01 0755) BP: (101-155)/(51-79) 150/75 mmHg (09/01 1030) SpO2:  [94 %-99 %] 97 % (09/01 0755) Weight:  [276 lb 14.4 oz (125.601 kg)-279 lb 15.8 oz (127 kg)] 279 lb 15.8 oz (127 kg) (09/01 0755)  Intake/Output from previous day: 08/31 0701 - 09/01 0700 In: 1484.3 [P.O.:1120; I.V.:364.3] Out: -  Intake/Output this shift:    Physical Exam: Work of breathing is not labored.  Incision dry and unchanged in neck.    Lab Results:  Results for orders placed during the hospital encounter of 03/30/13 (from the past 48 hour(s))  RENAL FUNCTION PANEL     Status: Abnormal   Collection Time    04/01/13  6:35 PM      Result Value Range   Sodium 135  135 - 145 mEq/L   Potassium 3.9  3.5 - 5.1 mEq/L   Chloride 93 (*) 96 - 112 mEq/L   CO2 25  19 - 32 mEq/L   Glucose, Bld 109 (*) 70 - 99 mg/dL   BUN 28 (*) 6 - 23 mg/dL   Creatinine, Ser 9.48 (*) 0.50 - 1.35 mg/dL   Calcium 8.9  8.4 - 10.5 mg/dL   Phosphorus 3.4  2.3 - 4.6 mg/dL   Albumin 3.3 (*) 3.5 - 5.2 g/dL   GFR calc non Af Amer 7 (*) >90 mL/min   GFR calc Af Amer 8 (*) >90 mL/min   Comment: (NOTE)     The eGFR has been calculated using the CKD EPI equation.     This calculation has not been validated in all clinical situations.     eGFR's persistently <90 mL/min signify possible Chronic Kidney     Disease.  RENAL FUNCTION PANEL     Status: Abnormal   Collection Time    04/02/13  8:00 AM      Result Value Range   Sodium 133 (*) 135 - 145 mEq/L   Potassium 3.8  3.5 - 5.1 mEq/L   Chloride 93 (*) 96 - 112 mEq/L   CO2 23  19 - 32 mEq/L   Glucose, Bld 94  70 - 99 mg/dL   BUN 35 (*) 6 - 23 mg/dL   Creatinine, Ser 11.68 (*) 0.50 - 1.35 mg/dL   Calcium 7.9 (*) 8.4 - 10.5 mg/dL   Phosphorus 3.6  2.3 - 4.6 mg/dL   Albumin 3.3 (*) 3.5 - 5.2 g/dL   GFR calc non Af Amer 5 (*) >90 mL/min   GFR calc Af Amer 6 (*) >90 mL/min   Comment: (NOTE)     The eGFR has been calculated using the CKD EPI equation.     This calculation has not been validated in all clinical situations.     eGFR's persistently <90 mL/min signify possible Chronic Kidney     Disease.  RENAL FUNCTION PANEL     Status: Abnormal   Collection Time    04/02/13  3:50 PM      Result Value Range   Sodium 134 (*) 135 - 145 mEq/L   Potassium 4.2  3.5 - 5.1 mEq/L  Chloride 91 (*) 96 - 112 mEq/L   CO2 25  19 - 32 mEq/L   Glucose, Bld 99  70 - 99 mg/dL   BUN 40 (*) 6 - 23 mg/dL   Creatinine, Ser 12.33 (*) 0.50 - 1.35 mg/dL   Calcium 8.1 (*) 8.4 - 10.5 mg/dL   Phosphorus 3.4  2.3 - 4.6 mg/dL   Albumin 3.4 (*) 3.5 - 5.2 g/dL   GFR calc non Af Amer 5 (*) >90 mL/min   GFR calc Af Amer 6 (*) >90 mL/min   Comment: (NOTE)     The eGFR has been calculated using the CKD EPI equation.     This calculation has not been validated in all clinical situations.     eGFR's persistently <90 mL/min signify possible Chronic Kidney     Disease.  RENAL FUNCTION PANEL     Status: Abnormal   Collection Time    04/03/13  8:00 AM      Result Value Range   Sodium 133 (*) 135 - 145 mEq/L   Potassium 4.1  3.5 - 5.1 mEq/L   Chloride 91 (*) 96 - 112 mEq/L   CO2 23  19 - 32 mEq/L   Glucose, Bld 91  70 - 99 mg/dL   BUN 48 (*) 6 - 23 mg/dL   Creatinine, Ser 14.62 (*) 0.50 - 1.35 mg/dL   Calcium 7.4 (*) 8.4 - 10.5 mg/dL   Phosphorus 4.1  2.3 - 4.6 mg/dL   Albumin 3.3 (*) 3.5 - 5.2 g/dL   GFR calc non Af Amer 4 (*) >90 mL/min   GFR calc Af Amer 5 (*) >90 mL/min   Comment: (NOTE)     The eGFR has been calculated using the CKD EPI equation.     This calculation has not been validated in all  clinical situations.     eGFR's persistently <90 mL/min signify possible Chronic Kidney     Disease.    Radiology/Results: No results found.  Anti-infectives: Anti-infectives   Start     Dose/Rate Route Frequency Ordered Stop   03/30/13 0600  ceFAZolin (ANCEF) 3 g in dextrose 5 % 50 mL IVPB     3 g 160 mL/hr over 30 Minutes Intravenous On call to O.R. 03/29/13 1449 03/30/13 1239      Assessment/Plan: Problem List: Patient Active Problem List   Diagnosis Date Noted  . Hyperparathyroidism, secondary 03/14/2013    Discharge per nephrology depending on his calcium level.   4 Days Post-Op    LOS: 4 days   Matt B. Hassell Done, MD, [facility] [phone] beeper [phone]  04/03/2013 10:50 AM

## 2013-04-25 ENCOUNTER — Encounter (INDEPENDENT_AMBULATORY_CARE_PROVIDER_SITE_OTHER): Payer: Medicare Other | Admitting: Surgery

## 2013-05-04 ENCOUNTER — Telehealth (INDEPENDENT_AMBULATORY_CARE_PROVIDER_SITE_OTHER): Payer: Self-pay

## 2013-05-04 NOTE — Telephone Encounter (Signed)
Patient is asking for P/O appointment ; No appointments until mid Oct. Please advise

## 2013-05-08 ENCOUNTER — Other Ambulatory Visit (INDEPENDENT_AMBULATORY_CARE_PROVIDER_SITE_OTHER): Payer: Self-pay

## 2013-05-08 ENCOUNTER — Telehealth (INDEPENDENT_AMBULATORY_CARE_PROVIDER_SITE_OTHER): Payer: Self-pay

## 2013-05-08 DIAGNOSIS — E215 Disorder of parathyroid gland, unspecified: Secondary | ICD-10-CM

## 2013-05-08 DIAGNOSIS — E0590 Thyrotoxicosis, unspecified without thyrotoxic crisis or storm: Secondary | ICD-10-CM

## 2013-05-08 NOTE — Telephone Encounter (Signed)
Appointment with Dr. Harlow Asa 05-26-13@ 245 . Patient to have drawn before OV TSH,calicium ,PTH mailed to patient home to have done at dialysis

## 2013-05-08 NOTE — Telephone Encounter (Signed)
Mid October follow up appointment is fine.  Needs lab work before OV - TSH level, calcium level, intact PTH level.  May have done at dialysis.  Earnstine Regal, MD, [facility] Office: [phone]

## 2013-05-26 ENCOUNTER — Ambulatory Visit (INDEPENDENT_AMBULATORY_CARE_PROVIDER_SITE_OTHER): Payer: Medicare Other | Admitting: Surgery

## 2013-05-26 ENCOUNTER — Encounter (INDEPENDENT_AMBULATORY_CARE_PROVIDER_SITE_OTHER): Payer: Self-pay | Admitting: Surgery

## 2013-05-26 VITALS — BP 120/66 | HR 80 | Temp 97.6°F | Resp 15 | Ht 65.0 in | Wt 277.6 lb

## 2013-05-26 DIAGNOSIS — N2581 Secondary hyperparathyroidism of renal origin: Secondary | ICD-10-CM

## 2013-05-26 NOTE — Progress Notes (Signed)
General Surgery [facility]  Chief Complaint  Patient presents with  . Routine Post Op    total parathyroidectomy (3 glands) with autotransplant 03/30/2013    HISTORY: Patient is a 27 year old male who underwent neck exploration and total parathyroidectomy on 03/30/2013. Postoperatively the patient has had significant hypocalcemia. Recent laboratory studies show a calcium level of 6.0. Patient states that his most recent intact PTH level was 7.  EXAM: Surgical wounds are healing nicely without complication. Mild keloid formation. No sign of seroma. No sign of infection. Voice quality is normal.  IMPRESSION: Status post total parathyroidectomy with autotransplantation for secondary hyperparathyroidism  PLAN: Patient will begin applying topical creams to his incisions. The nephrologist we'll continue to monitor his laboratory values at hemodialysis. Hopefully his parathyroid hormone level will begin to rise slightly into a more normal range. This should allow his calcium level to rise to the normal range as well without such aggressive supplementation.  Patient will return for surgical care as needed.  Earnstine Regal, MD, [facility]   Visit Diagnoses: 1. Hyperparathyroidism, secondary

## 2013-05-26 NOTE — Patient Instructions (Signed)
  COCOA BUTTER & VITAMIN E CREAM  ([employer] or other brand)  Apply cocoa butter/vitamin E cream to your incision 2 - 3 times daily.  Massage cream into incision for one minute with each application.  Use sunscreen (50 SPF or higher) for first 6 months after surgery if area is exposed to sun.  You may substitute Mederma or other scar reducing creams as desired.   

## 2013-06-06 ENCOUNTER — Encounter (INDEPENDENT_AMBULATORY_CARE_PROVIDER_SITE_OTHER): Payer: Self-pay

## 2013-06-28 ENCOUNTER — Encounter: Payer: Self-pay | Admitting: Vascular Surgery

## 2013-07-11 ENCOUNTER — Encounter: Payer: Self-pay | Admitting: Vascular Surgery

## 2013-07-12 ENCOUNTER — Encounter (INDEPENDENT_AMBULATORY_CARE_PROVIDER_SITE_OTHER): Payer: Self-pay

## 2013-07-12 ENCOUNTER — Other Ambulatory Visit: Payer: Self-pay | Admitting: *Deleted

## 2013-07-12 ENCOUNTER — Encounter: Payer: Self-pay | Admitting: Vascular Surgery

## 2013-07-12 ENCOUNTER — Ambulatory Visit (INDEPENDENT_AMBULATORY_CARE_PROVIDER_SITE_OTHER): Payer: Medicare Other | Admitting: Vascular Surgery

## 2013-07-12 ENCOUNTER — Ambulatory Visit (HOSPITAL_COMMUNITY)
Admission: RE | Admit: 2013-07-12 | Discharge: 2013-07-12 | Disposition: A | Discharge status: Home / Self Care | Payer: Medicare Other | Source: Ambulatory Visit | Attending: Vascular Surgery | Admitting: Vascular Surgery

## 2013-07-12 VITALS — BP 110/56 | HR 76 | Ht 65.0 in | Wt 285.0 lb

## 2013-07-12 DIAGNOSIS — N186 End stage renal disease: Secondary | ICD-10-CM

## 2013-07-12 DIAGNOSIS — T82598A Other mechanical complication of other cardiac and vascular devices and implants, initial encounter: Secondary | ICD-10-CM

## 2013-07-12 DIAGNOSIS — Y849 Medical procedure, unspecified as the cause of abnormal reaction of the patient, or of later complication, without mention of misadventure at the time of the procedure: Secondary | ICD-10-CM | POA: Insufficient documentation

## 2013-07-12 NOTE — Assessment & Plan Note (Signed)
The patient appears to have several issues with his left radiocephalic AV fistula. 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 I have recommended that he undergo a fistulogram to further evaluate these areas and possible venoplasty. He would prefer to have this done by Dr. Augustin Coupe. He will speak to the nurses at the dialysis center to arrange this. Certainly we'll be able to help if needed to ligate any competing branches.

## 2013-07-12 NOTE — Progress Notes (Signed)
[facility]ist of [facility]  Patient name: [name] MRN: [id number] DOB: [birth year] Sex: male  REASON FOR CONSULT: evaluate left radiocephalic AV fistula.  HPI: [name] is a 27 y.o. male who had a right radiocephalic fistula placed in 2006. He tells me that they were having problems with this and therefore a left radiocephalic AV fistula was placed in 2007. He dialyzes on Monday Wednesdays and Fridays and apparently there still using the right forearm AV fistula. He was referred for ligation of an assessor he vein of his fistula. However he has not yet had a duplex scan or a fistulogram. He denies any pain or paresthesias associated with his left or right forearm fistulas.    Past Medical History  Diagnosis Date  . Hyperparathyroidism   . Anemia   . Dyslipidemia   . Pancreatitis   . Morbid obesity   . Hypertension     controlled with dialysis  . Renal disorder     MWF  . GERD (gastroesophageal reflux disease)     prn Prilosec  . Arthritis   . Complication of anesthesia     A little while to wake up after knee surgery in 2008   SOCIAL HISTORY: History  Substance Use Topics  . Smoking status: Former Smoker -- 0.20 packs/day for 13 years    Types: Cigarettes    Quit date: 05/12/2013  . Smokeless tobacco: Not on file  . Alcohol Use: No   No Known Allergies Current Outpatient Prescriptions  Medication Sig Dispense Refill  . acetaminophen (TYLENOL) 325 MG tablet Take 325 mg by mouth every 6 (six) hours as needed.      . calcium acetate (PHOSLO) 667 MG capsule Take 3,335 mg by mouth 3 (three) times daily with meals.       . calcium carbonate, dosed in mg elemental calcium, 1250 MG/5ML Take 15 mLs (1,500 mg of elemental calcium total) by mouth 4 (four) times daily.  450 mL  6  . multivitamin (RENA-VIT) TABS tablet Take 1 tablet by mouth at bedtime.  30 tablet  0  . HYDROcodone-acetaminophen (NORCO/VICODIN) 5-325 MG per tablet Take 1-2 tablets by mouth every  4 (four) hours as needed.  30 tablet  0   No current facility-administered medications for this visit.   REVIEW OF SYSTEMS: Valu.Nieves ] denotes positive finding; [  ] denotes negative finding  CARDIOVASCULAR:  [ ]  chest pain   [ ]  chest pressure   [ ]  palpitations   [ ]  orthopnea   [ ]  dyspnea on exertion   [ ]  claudication   [ ]  rest pain   [ ]  DVT   [ ]  phlebitis PULMONARY:   [ ]  productive cough   [ ]  asthma   [ ]  wheezing NEUROLOGIC:   [ ]  weakness  [ ]  paresthesias  [ ]  aphasia  [ ]  amaurosis  [ ]  dizziness HEMATOLOGIC:   [ ]  bleeding problems   [ ]  clotting disorders MUSCULOSKELETAL:  [ ]  joint pain   [ ]  joint swelling [ ]  leg swelling GASTROINTESTINAL: [ ]   blood in stool  [ ]   hematemesis GENITOURINARY:  [ ]   dysuria  [ ]   hematuria PSYCHIATRIC:  [ ]  history of major depression INTEGUMENTARY:  [ ]  rashes  [ ]  ulcers CONSTITUTIONAL:  [ ]  fever   [ ]  chills  PHYSICAL EXAM: Filed Vitals:   07/12/13 1104  BP: 110/56  Pulse: 76  Height: 5\' 5"  (1.651 m)  Weight: 285 lb (129.275 kg)  SpO2: 100%   Body mass index is 47.43 kg/(m^2). GENERAL: The patient is a well-nourished male, in no acute distress. The vital signs are documented above. CARDIOVASCULAR: There is a regular rate and rhythm.  PULMONARY: There is good air exchange bilaterally without wheezing or rales. 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 He has a good bruit and thrill. Is a left radiocephalic fistula also has a good bruit and thrill. It appears to be good size proximally but it is more difficult to follow distally.  DATA:  We obtained a duplex of his left forearm AV fistula today. This shows a stenosis at the proximal fistula with peak systolic velocity of [id number] cm/s. There appeared to be 2 competing branches in the forearm. There is a "frozen valve" at the antecubital level.  MEDICAL ISSUES:  End stage renal disease The patient appears to have several issues with his left radiocephalic AV fistula.  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 I have recommended that he undergo a fistulogram to further evaluate these areas and possible venoplasty. He would prefer to have this done by Dr. Augustin Coupe. He will speak to the nurses at the dialysis center to arrange this. Certainly we'll be able to help if needed to ligate any competing branches.   [place] [facility] [place] Beeper: [phone]

## 2013-07-13 ENCOUNTER — Encounter: Payer: Self-pay | Admitting: *Deleted

## 2013-11-07 NOTE — ED Notes (Signed)
Accessed record for [facility]

## 2014-01-10 ENCOUNTER — Encounter (HOSPITAL_COMMUNITY): Payer: Self-pay | Admitting: Emergency Medicine

## 2014-01-10 ENCOUNTER — Emergency Department (HOSPITAL_COMMUNITY)
Admission: EM | Admit: 2014-01-10 | Discharge: 2014-01-10 | Disposition: A | Discharge status: Home / Self Care | Payer: Medicare Other | Attending: Emergency Medicine | Admitting: Emergency Medicine

## 2014-01-10 ENCOUNTER — Emergency Department (HOSPITAL_COMMUNITY): Payer: Medicare Other

## 2014-01-10 DIAGNOSIS — R1011 Right upper quadrant pain: Secondary | ICD-10-CM | POA: Insufficient documentation

## 2014-01-10 DIAGNOSIS — E213 Hyperparathyroidism, unspecified: Secondary | ICD-10-CM | POA: Diagnosis not present

## 2014-01-10 DIAGNOSIS — D649 Anemia, unspecified: Secondary | ICD-10-CM | POA: Insufficient documentation

## 2014-01-10 DIAGNOSIS — E785 Hyperlipidemia, unspecified: Secondary | ICD-10-CM | POA: Insufficient documentation

## 2014-01-10 DIAGNOSIS — R197 Diarrhea, unspecified: Secondary | ICD-10-CM | POA: Insufficient documentation

## 2014-01-10 DIAGNOSIS — Z87891 Personal history of nicotine dependence: Secondary | ICD-10-CM | POA: Insufficient documentation

## 2014-01-10 DIAGNOSIS — M129 Arthropathy, unspecified: Secondary | ICD-10-CM | POA: Insufficient documentation

## 2014-01-10 DIAGNOSIS — K219 Gastro-esophageal reflux disease without esophagitis: Secondary | ICD-10-CM | POA: Diagnosis not present

## 2014-01-10 DIAGNOSIS — R112 Nausea with vomiting, unspecified: Secondary | ICD-10-CM | POA: Insufficient documentation

## 2014-01-10 DIAGNOSIS — Z79899 Other long term (current) drug therapy: Secondary | ICD-10-CM | POA: Insufficient documentation

## 2014-01-10 DIAGNOSIS — Z992 Dependence on renal dialysis: Secondary | ICD-10-CM | POA: Insufficient documentation

## 2014-01-10 DIAGNOSIS — I129 Hypertensive chronic kidney disease with stage 1 through stage 4 chronic kidney disease, or unspecified chronic kidney disease: Secondary | ICD-10-CM | POA: Insufficient documentation

## 2014-01-10 LAB — CBC WITH DIFFERENTIAL/PLATELET
BASOS PCT: 0 % (ref 0–1)
Basophils Absolute: 0 10*3/uL (ref 0.0–0.1)
EOS PCT: 1 % (ref 0–5)
Eosinophils Absolute: 0.1 10*3/uL (ref 0.0–0.7)
HCT: 34.1 % — ABNORMAL LOW (ref 39.0–52.0)
HEMOGLOBIN: 11.9 g/dL — AB (ref 13.0–17.0)
LYMPHS PCT: 28 % (ref 12–46)
Lymphs Abs: 3.6 10*3/uL (ref 0.7–4.0)
MCH: 30.6 pg (ref 26.0–34.0)
MCHC: 34.9 g/dL (ref 30.0–36.0)
MCV: 87.7 fL (ref 78.0–100.0)
Monocytes Absolute: 1.7 10*3/uL — ABNORMAL HIGH (ref 0.1–1.0)
Monocytes Relative: 13 % — ABNORMAL HIGH (ref 3–12)
NEUTROS PCT: 58 % (ref 43–77)
Neutro Abs: 7.3 10*3/uL (ref 1.7–7.7)
Platelets: 216 10*3/uL (ref 150–400)
RBC: 3.89 MIL/uL — AB (ref 4.22–5.81)
RDW: 13.2 % (ref 11.5–15.5)
WBC: 12.7 10*3/uL — ABNORMAL HIGH (ref 4.0–10.5)

## 2014-01-10 LAB — COMPREHENSIVE METABOLIC PANEL
ALK PHOS: 50 U/L (ref 39–117)
ALT: 12 U/L (ref 0–53)
AST: 10 U/L (ref 0–37)
Albumin: 4 g/dL (ref 3.5–5.2)
BUN: 52 mg/dL — ABNORMAL HIGH (ref 6–23)
CHLORIDE: 87 meq/L — AB (ref 96–112)
CO2: 22 meq/L (ref 19–32)
Calcium: 8.4 mg/dL (ref 8.4–10.5)
Creatinine, Ser: 13.18 mg/dL — ABNORMAL HIGH (ref 0.50–1.35)
GFR, EST AFRICAN AMERICAN: 5 mL/min — AB (ref >90)
GFR, EST NON AFRICAN AMERICAN: 4 mL/min — AB (ref >90)
GLUCOSE: 85 mg/dL (ref 70–99)
POTASSIUM: 3.4 meq/L — AB (ref 3.7–5.3)
SODIUM: 135 meq/L — AB (ref 137–147)
Total Bilirubin: 0.5 mg/dL (ref 0.3–1.2)
Total Protein: 8 g/dL (ref 6.0–8.3)

## 2014-01-10 LAB — I-STAT TROPONIN, ED: Troponin i, poc: 0.01 ng/mL (ref 0.00–0.08)

## 2014-01-10 LAB — LIPASE, BLOOD: LIPASE: 38 U/L (ref 11–59)

## 2014-01-10 LAB — PRO B NATRIURETIC PEPTIDE: Pro B Natriuretic peptide (BNP): 958.2 pg/mL — ABNORMAL HIGH (ref 0–125)

## 2014-01-10 MED ORDER — HYDROCODONE-ACETAMINOPHEN 5-325 MG PO TABS: 1.0000 | ORAL_TABLET | Freq: Four times a day (QID) | ORAL | Status: DC | PRN

## 2014-01-10 MED ORDER — MORPHINE SULFATE 4 MG/ML IJ SOLN
4.0000 mg | Freq: Once | INTRAMUSCULAR | Status: AC
  Administered 2014-01-10: 4 mg via INTRAVENOUS
  Filled 2014-01-10: qty 1

## 2014-01-10 MED ORDER — ONDANSETRON HCL 4 MG/2ML IJ SOLN
4.0000 mg | Freq: Once | INTRAMUSCULAR | Status: AC
  Administered 2014-01-10: 4 mg via INTRAVENOUS
  Filled 2014-01-10: qty 2

## 2014-01-10 MED ORDER — ONDANSETRON HCL 4 MG PO TABS: 4.0000 mg | ORAL_TABLET | Freq: Four times a day (QID) | ORAL | Status: DC

## 2014-01-10 NOTE — ED Notes (Signed)
Pt tolerated fluids 

## 2014-01-10 NOTE — Discharge Instructions (Signed)
Viral Gastroenteritis Viral gastroenteritis is also known as stomach flu. This condition affects the stomach and intestinal tract. It can cause sudden diarrhea and vomiting. The illness typically lasts 3 to 8 days. Most people develop an immune response that eventually gets rid of the virus. While this natural response develops, the virus can make you quite ill. CAUSES  Many different viruses can cause gastroenteritis, such as rotavirus or noroviruses. You can catch one of these viruses by consuming contaminated food or water. You may also catch a virus by sharing utensils or other personal items with an infected person or by touching a contaminated surface. SYMPTOMS  The most common symptoms are diarrhea and vomiting. These problems can cause a severe loss of body fluids (dehydration) and a body salt (electrolyte) imbalance. Other symptoms may include:  Fever.  Headache.  Fatigue.  Abdominal pain. DIAGNOSIS  Your caregiver can usually diagnose viral gastroenteritis based on your symptoms and a physical exam. A stool sample may also be taken to test for the presence of viruses or other infections. TREATMENT  This illness typically goes away on its own. Treatments are aimed at rehydration. The most serious cases of viral gastroenteritis involve vomiting so severely that you are not able to keep fluids down. In these cases, fluids must be given through an intravenous line (IV). HOME CARE INSTRUCTIONS   Drink enough fluids to keep your urine clear or pale yellow. Drink small amounts of fluids frequently and increase the amounts as tolerated.  Ask your caregiver for specific rehydration instructions.  Avoid:  Foods high in sugar.  Alcohol.  Carbonated drinks.  Tobacco.  Juice.  Caffeine drinks.  Extremely hot or cold fluids.  Fatty, greasy foods.  Too much intake of anything at one time.  Dairy products until 24 to 48 hours after diarrhea stops.  You may consume probiotics.  Probiotics are active cultures of beneficial bacteria. They may lessen the amount and number of diarrheal stools in adults. Probiotics can be found in yogurt with active cultures and in supplements.  Wash your hands well to avoid spreading the virus.  Only take over-the-counter or prescription medicines for pain, discomfort, or fever as directed by your caregiver. Do not give aspirin to children. Antidiarrheal medicines are not recommended.  Ask your caregiver if you should continue to take your regular prescribed and over-the-counter medicines.  Keep all follow-up appointments as directed by your caregiver. SEEK IMMEDIATE MEDICAL CARE IF:   You are unable to keep fluids down.  You do not urinate at least once every 6 to 8 hours.  You develop shortness of breath.  You notice blood in your stool or vomit. This may look like coffee grounds.  You have abdominal pain that increases or is concentrated in one small area (localized).  You have persistent vomiting or diarrhea.  You have a fever.  The patient is a child younger than 3 months, and he or she has a fever.  The patient is a child older than 3 months, and he or she has a fever and persistent symptoms.  The patient is a child older than 3 months, and he or she has a fever and symptoms suddenly get worse.  The patient is a baby, and he or she has no tears when crying. MAKE SURE YOU:   Understand these instructions.  Will watch your condition.  Will get help right away if you are not doing well or get worse. Document Released: 07/20/2005 Document Revised: 10/12/2011 Document Reviewed: 05/06/2011   [employer] Patient Information 2014 [employer].  

## 2014-01-10 NOTE — ED Notes (Signed)
Patient transported back to Pod A from Ultrasound.

## 2014-01-10 NOTE — ED Provider Notes (Signed)
CSN: [id number]     Arrival date & time 01/10/14  [id number] History   First MD Initiated Contact with Patient 01/10/14 1118     Chief Complaint  Patient presents with  . Emesis     (Consider location/radiation/quality/duration/timing/severity/associated sxs/prior Treatment) HPI Comments: Patient presents to the emergency department with chief complaint of nausea, vomiting, and diarrhea since Sunday. He states that he has been unable to tolerate any oral intake. He denies fevers, chills, chest pain, shortness of breath. His dialysis patient, and is dialyzed on Monday, Wednesday, and Friday. He has not been dialyzed today. He denies seeing any blood in his vomit or stool. There no aggravating or alleviating factors. He also complains of upper abdominal pain.  The history is provided by the patient. No language interpreter was used.    Past Medical History  Diagnosis Date  . Hyperparathyroidism   . Anemia   . Dyslipidemia   . Pancreatitis   . Morbid obesity   . Hypertension     controlled with dialysis  . Renal disorder     MWF  . GERD (gastroesophageal reflux disease)     prn Prilosec  . Arthritis   . Complication of anesthesia     A little while to wake up after knee surgery in 2008   Past Surgical History  Procedure Laterality Date  . Av fistula placement Bilateral     Uses Right arm  . Esophagogastroduodenoscopy endoscopy    . Knee arthroscopy Right 2007  . Parathyroidectomy N/A 03/30/2013    Procedure: TOTAL PARATHYROIDECTOMY WITH AUTOTRANSPLANT;  Surgeon: Earnstine Regal, MD;  Location: [facility];  Service: General;  Laterality: N/A;  Autotransplant to left lower arm.   History reviewed. No pertinent family history. History  Substance Use Topics  . Smoking status: Former Smoker -- 0.20 packs/day for 13 years    Types: Cigarettes    Quit date: 05/12/2013  . Smokeless tobacco: Not on file  . Alcohol Use: No    Review of Systems  All other systems reviewed and are  negative.     Allergies  Review of patient's allergies indicates no known allergies.  Home Medications   Prior to Admission medications   Medication Sig Start Date End Date Taking? Authorizing Provider  acetaminophen (TYLENOL) 325 MG tablet Take 325 mg by mouth every 6 (six) hours as needed.   Yes Historical Provider, MD  calcium acetate (PHOSLO) 667 MG capsule Take 3,335 mg by mouth 3 (three) times daily with meals.    Yes Historical Provider, MD  calcium carbonate (TUMS - DOSED IN MG ELEMENTAL CALCIUM) 500 MG chewable tablet Chew 1 tablet by mouth daily.   Yes Historical Provider, MD  calcium carbonate, dosed in mg elemental calcium, 1250 MG/5ML Take 15 mLs (1,500 mg of elemental calcium total) by mouth 4 (four) times daily. 04/03/13  Yes Louis Meckel, MD  multivitamin (RENA-VIT) TABS tablet Take 1 tablet by mouth at bedtime. 04/03/13  Yes Louis Meckel, MD  omeprazole (PRILOSEC) 20 MG capsule Take 20 mg by mouth daily.   Yes Historical Provider, MD   BP 112/52  Pulse 64  Temp(Src) 98.3 F (36.8 C) (Oral)  Resp 17  Wt 280 lb (127.007 kg)  SpO2 92% Physical Exam  Nursing note and vitals reviewed. Constitutional: He is oriented to person, place, and time. He appears well-developed and well-nourished.  HENT:  Head: Normocephalic and atraumatic.  Eyes: Conjunctivae and EOM are normal. Pupils are equal, round, and reactive to  light. Right eye exhibits no discharge. Left eye exhibits no discharge. No scleral icterus.  Neck: Normal range of motion. Neck supple. No JVD present.  Cardiovascular: Normal rate, regular rhythm and normal heart sounds.  Exam reveals no gallop and no friction rub.   No murmur heard. Pulmonary/Chest: Effort normal and breath sounds normal. No respiratory distress. He has no wheezes. He has no rales. He exhibits no tenderness.  Abdominal: Soft. He exhibits no distension and no mass. There is no tenderness. There is no rebound and no guarding.   Moderate right upper quadrant tenderness, no other focal abdominal tenderness  Musculoskeletal: Normal range of motion. He exhibits no edema and no tenderness.  Neurological: He is alert and oriented to person, place, and time.  Skin: Skin is warm and dry.  Psychiatric: He has a normal mood and affect. His behavior is normal. Judgment and thought content normal.    ED Course  Procedures (including critical care time) Results for orders placed during the hospital encounter of 01/10/14  CBC WITH DIFFERENTIAL      Result Value Ref Range   WBC 12.7 (*) 4.0 - 10.5 K/uL   RBC 3.89 (*) 4.22 - 5.81 MIL/uL   Hemoglobin 11.9 (*) 13.0 - 17.0 g/dL   HCT 34.1 (*) 39.0 - 52.0 %   MCV 87.7  78.0 - 100.0 fL   MCH 30.6  26.0 - 34.0 pg   MCHC 34.9  30.0 - 36.0 g/dL   RDW 13.2  11.5 - 15.5 %   Platelets 216  150 - 400 K/uL   Neutrophils Relative % 58  43 - 77 %   Lymphocytes Relative 28  12 - 46 %   Monocytes Relative 13 (*) 3 - 12 %   Eosinophils Relative 1  0 - 5 %   Basophils Relative 0  0 - 1 %   Neutro Abs 7.3  1.7 - 7.7 K/uL   Lymphs Abs 3.6  0.7 - 4.0 K/uL   Monocytes Absolute 1.7 (*) 0.1 - 1.0 K/uL   Eosinophils Absolute 0.1  0.0 - 0.7 K/uL   Basophils Absolute 0.0  0.0 - 0.1 K/uL   Smear Review MORPHOLOGY UNREMARKABLE    COMPREHENSIVE METABOLIC PANEL      Result Value Ref Range   Sodium 135 (*) 137 - 147 mEq/L   Potassium 3.4 (*) 3.7 - 5.3 mEq/L   Chloride 87 (*) 96 - 112 mEq/L   CO2 22  19 - 32 mEq/L   Glucose, Bld 85  70 - 99 mg/dL   BUN 52 (*) 6 - 23 mg/dL   Creatinine, Ser 13.18 (*) 0.50 - 1.35 mg/dL   Calcium 8.4  8.4 - 10.5 mg/dL   Total Protein 8.0  6.0 - 8.3 g/dL   Albumin 4.0  3.5 - 5.2 g/dL   AST 10  0 - 37 U/L   ALT 12  0 - 53 U/L   Alkaline Phosphatase 50  39 - 117 U/L   Total Bilirubin 0.5  0.3 - 1.2 mg/dL   GFR calc non Af Amer 4 (*) >90 mL/min   GFR calc Af Amer 5 (*) >90 mL/min  PRO B NATRIURETIC PEPTIDE      Result Value Ref Range   Pro B Natriuretic  peptide (BNP) 958.2 (*) 0 - 125 pg/mL  LIPASE, BLOOD      Result Value Ref Range   Lipase 38  11 - 59 U/L  Randolm Idol, ED  Result Value Ref Range   Troponin i, poc 0.01  0.00 - 0.08 ng/mL   Comment 3            US Abdomen Limited  01/10/2014   CLINICAL DATA:  Right upper quadrant pain.  EXAM: US ABDOMEN LIMITED - RIGHT UPPER QUADRANT  COMPARISON:  04/06/2012  FINDINGS: Gallbladder:  No gallstones or wall thickening visualized. No sonographic Murphy sign noted.  Common bile duct:  Diameter: 5.4 mm, normal.  Liver:  No focal lesion identified. Within normal limits in parenchymal echogenicity.  IMPRESSION: Normal exam.   Electronically Signed   By: Rozetta Nunnery M.D.   On: 01/10/2014 14:57   Dg Chest Port 1 View  01/10/2014   CLINICAL DATA:  Short of breath, vomiting  EXAM: PORTABLE CHEST - 1 VIEW  COMPARISON:  07/18/2012  FINDINGS: Hypoventilation with decreased lung volume and mild bibasilar atelectasis. Negative for pneumonia or effusion.  IMPRESSION: Hypoventilation with mild bibasilar atelectasis.   Electronically Signed   By: Franchot Gallo M.D.   On: 01/10/2014 12:12     Imaging Review Dg Chest Port 1 View  01/10/2014   CLINICAL DATA:  Short of breath, vomiting  EXAM: PORTABLE CHEST - 1 VIEW  COMPARISON:  07/18/2012  FINDINGS: Hypoventilation with decreased lung volume and mild bibasilar atelectasis. Negative for pneumonia or effusion.  IMPRESSION: Hypoventilation with mild bibasilar atelectasis.   Electronically Signed   By: Franchot Gallo M.D.   On: 01/10/2014 12:12     EKG Interpretation None      MDM   Final diagnoses:  Nausea vomiting and diarrhea    Patient with nausea, vomiting, diarrhea since Sunday. Also some right upper quadrant pain. Ultrasound reassuring. Labs are at baseline. No indication for emergent dialysis. Will treat the patient with Zofran and some pain medicine. Recommend discharge to home with PCP f/u.    Montine Circle, [name] 01/10/14 1527

## 2014-01-10 NOTE — ED Notes (Signed)
Pt given sprite for fluid challenge °

## 2014-01-10 NOTE — ED Provider Notes (Signed)
Medical screening examination/treatment/procedure(s) were performed by non-physician practitioner and as supervising physician I was immediately available for consultation/collaboration.   EKG Interpretation   Date/Time:  Wednesday January 10 2014 11:25:21 EDT Ventricular Rate:  85 PR Interval:  176 QRS Duration: 122 QT Interval:  376 QTC Calculation: 447 R Axis:   30 Text Interpretation:  Age not entered, assumed to be  28 years old for  purpose of ECG interpretation Sinus rhythm Nonspecific intraventricular  conduction delay No significant change was found Confirmed by Venora Maples  MD,  Darnette Lampron ([id number]) on 01/10/2014 4:51:01 PM        Hoy Morn, MD 01/10/14 1651

## 2014-01-10 NOTE — ED Notes (Addendum)
hes had vomiting and diarrhea since Sunday. Denies pain. hes been unable to tolerate oral intake

## 2014-01-16 ENCOUNTER — Ambulatory Visit (INDEPENDENT_AMBULATORY_CARE_PROVIDER_SITE_OTHER): Payer: Medicare Other | Admitting: Surgery

## 2014-02-15 ENCOUNTER — Encounter (INDEPENDENT_AMBULATORY_CARE_PROVIDER_SITE_OTHER): Payer: Self-pay | Admitting: Surgery

## 2014-02-15 ENCOUNTER — Ambulatory Visit (INDEPENDENT_AMBULATORY_CARE_PROVIDER_SITE_OTHER): Payer: Medicare Other | Admitting: Surgery

## 2014-02-15 ENCOUNTER — Other Ambulatory Visit (INDEPENDENT_AMBULATORY_CARE_PROVIDER_SITE_OTHER): Payer: Self-pay

## 2014-02-15 DIAGNOSIS — E215 Disorder of parathyroid gland, unspecified: Secondary | ICD-10-CM

## 2014-02-15 DIAGNOSIS — R632 Polyphagia: Secondary | ICD-10-CM

## 2014-02-15 DIAGNOSIS — R109 Unspecified abdominal pain: Secondary | ICD-10-CM

## 2014-02-15 DIAGNOSIS — N186 End stage renal disease: Secondary | ICD-10-CM

## 2014-02-15 NOTE — Progress Notes (Signed)
Re:   [name] DOB:   1986/02/09 MRN:   [id number]  ASSESSMENT AND PLAN: 1.  Morbid obesity  Weight - 291, BMI 47.07  Per the [facility], the patient is a candidate for bariatric surgery.  The patient attended our information session and reviewed the different types of bariatric surgery.    The patient is interested in the laparoscopic adjustable gastric band.  I discussed with the patient the indications and risks of lap band surgery.  The potential risks of surgery include, but are not limited to, bleeding, infection, DVT and PE, slippage and erosion of the band, open surgery, and death.  The patient understands the importance of compliance and long term follow-up with our group after surgery.  She was given literature regarding lap band surgery.  From here we'll obtain labs, x-rays, nutrition consult, and psych consult.  2.  ESRD - Dr. Lenna Sciara. [place] 3.  HTN 4.  GERD 5.  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 Gerkin 6.  Arthritis right knee - Dr. Marlou Sa 7.  GERD 8.  On disability since 2004, but he says that he is looking for work.  Chief Complaint  Patient presents with  . Bariatric Pre-op   REFERRING PHYSICIAN: DETERDING,JAMES L, MD  HISTORY OF PRESENT ILLNESS: [name] is a 28 y.o. (DOB: [birth year])  AA  male whose primary care physician is DETERDING,JAMES L, MD and comes to me today for weight loss surgery.  The patient saw me in 2009 to consider weight loss surgery (lap band). But at that time he was not prepared to go through with the surgery. He has seen continued complications of being overweight and now is reconsidering surgery. He has tried diets Slim Fast and using the green Tea diet. He has some success with Slim fast where he lost about 30 pounds, but because of his renal failure there is some concern about phosphorus and electrolytes. He has more recently gone on the on line information session. He found this more useful than the one in person. He  is interested in lap band.  He does know a couple of people with the lap band.  He was in the [facility] ER on 01/10/2014 for nausea, vomiting, diarrhea.  His [place] was negative.   Past Medical History  Diagnosis Date  . Hyperparathyroidism   . Anemia   . Dyslipidemia   . Pancreatitis   . Morbid obesity   . Hypertension     controlled with dialysis  . Renal disorder     MWF  . GERD (gastroesophageal reflux disease)     prn Prilosec  . Arthritis   . Complication of anesthesia     A little while to wake up after knee surgery in 2008      Past Surgical History  Procedure Laterality Date  . Av fistula placement Bilateral     Uses Right arm  . Esophagogastroduodenoscopy endoscopy    . Knee arthroscopy Right 2007  . Parathyroidectomy N/A 03/30/2013    Procedure: TOTAL PARATHYROIDECTOMY WITH AUTOTRANSPLANT;  Surgeon: Earnstine Regal, MD;  Location: [facility];  Service: General;  Laterality: N/A;  Autotransplant to left lower arm.      Current Outpatient Prescriptions  Medication Sig Dispense Refill  . acetaminophen (TYLENOL) 325 MG tablet Take 325 mg by mouth every 6 (six) hours as needed.      . calcium acetate (PHOSLO) 667 MG capsule Take 3,335 mg by mouth 3 (three) times daily with meals.       Marland Kitchen  calcium carbonate (TUMS - DOSED IN MG ELEMENTAL CALCIUM) 500 MG chewable tablet Chew 1 tablet by mouth daily.      . calcium carbonate, dosed in mg elemental calcium, 1250 MG/5ML Take 15 mLs (1,500 mg of elemental calcium total) by mouth 4 (four) times daily.  450 mL  6  . multivitamin (RENA-VIT) TABS tablet Take 1 tablet by mouth at bedtime.  30 tablet  0  . omeprazole (PRILOSEC) 20 MG capsule Take 20 mg by mouth daily.      . ondansetron (ZOFRAN) 4 MG tablet Take 1 tablet (4 mg total) by mouth every 6 (six) hours.  12 tablet  0  . HYDROcodone-acetaminophen (NORCO/VICODIN) 5-325 MG per tablet Take 1-2 tablets by mouth every 6 (six) hours as needed.  7 tablet  0   No current facility-administered  medications for this visit.     No Known Allergies  REVIEW OF SYSTEMS: Skin:  No history of rash.  No history of abnormal moles. Infection:  No history of hepatitis or HIV.  No history of MRSA. Neurologic:  No history of stroke.  No history of seizure.  No history of headaches. Cardiac:  HTN.  No history of seeing a cardiologist. Pulmonary:  Quit smoking 2013.  Endocrine:  No diabetes. No thyroid disease. Gastrointestinal:  No history of stomach disease.  No history of liver disease.  No history of gall bladder disease.  No history of pancreas disease.  No history of colon disease. Urologic:  ESRD since 2005.  He gets hemodialysis on MWF.  He sees Dr. Lenna Sciara. [place].  Dr. Kristen Loader has done his fistulas. Musculoskeletal:  Right knee issues.  Sees Dr. Marlou Sa.  No immediate plan for surgery.  Left knee arthroscopic surg Ninfa Linden Hematologic:  No bleeding disorder.  No history of anemia.  Not anticoagulated. Psycho-social:  The patient is oriented.   The patient has no obvious psychologic or social impairment to understanding our conversation and plan.  SOCIAL and FAMILY HISTORY: Unmarried. Has no children. Lives with mother. On disability since 2004, though he says he may start looking for a job  PHYSICAL EXAM: Ht 5\' 6"  (1.676 m)  Wt 291 lb 9.6 oz (132.269 kg)  BMI 47.09 kg/m2  General: Obese AA M who is alert and generally healthy appearing.  HEENT: Normal. Pupils equal. Neck: Supple. No mass.  No thyroid mass. Lymph Nodes:  No supraclavicular or cervical nodes. Lungs: Clear to auscultation and symmetric breath sounds. Heart:  RRR. No murmur or rub. Abdomen: Soft. No mass. No tenderness. No hernia. Normal bowel sounds.  No abdominal scars. Rectal: Not done. Extremities:  Good strength and ROM  in upper and lower extremities. Neurologic:  Grossly intact to motor and sensory function. Psychiatric: Has normal mood and affect. Behavior is normal.   DATA REVIEWED: Epic notes and  labs.  Alphonsa Overall, MD,  [facility], [address]  [place], [place]    [facility] Phone:  [phone] FAX:  [phone]

## 2014-02-20 ENCOUNTER — Other Ambulatory Visit (HOSPITAL_COMMUNITY): Payer: Self-pay | Admitting: Orthopedic Surgery

## 2014-03-05 ENCOUNTER — Encounter (HOSPITAL_COMMUNITY): Payer: Self-pay | Admitting: Pharmacy Technician

## 2014-03-15 ENCOUNTER — Encounter (HOSPITAL_COMMUNITY): Payer: Self-pay | Admitting: Pharmacy Technician

## 2014-03-15 ENCOUNTER — Encounter (HOSPITAL_COMMUNITY): Admission: RE | Payer: Self-pay | Source: Ambulatory Visit

## 2014-03-15 ENCOUNTER — Ambulatory Visit (HOSPITAL_COMMUNITY): Admission: RE | Admit: 2014-03-15 | Payer: Medicare Other | Source: Ambulatory Visit | Admitting: Surgery

## 2014-03-15 SURGERY — BREATH TEST, FOR HELICOBACTER PYLORI

## 2014-03-20 ENCOUNTER — Encounter (HOSPITAL_COMMUNITY): Payer: Self-pay

## 2014-03-20 ENCOUNTER — Encounter (HOSPITAL_COMMUNITY)
Admission: RE | Admit: 2014-03-20 | Discharge: 2014-03-20 | Disposition: A | Discharge status: Home / Self Care | Payer: Medicare Other | Source: Ambulatory Visit | Attending: Orthopedic Surgery | Admitting: Orthopedic Surgery

## 2014-03-20 ENCOUNTER — Ambulatory Visit (HOSPITAL_COMMUNITY): Admission: RE | Admit: 2014-03-20 | Payer: Medicare Other | Source: Ambulatory Visit

## 2014-03-20 DIAGNOSIS — Z01818 Encounter for other preprocedural examination: Secondary | ICD-10-CM | POA: Insufficient documentation

## 2014-03-20 DIAGNOSIS — M1710 Unilateral primary osteoarthritis, unspecified knee: Secondary | ICD-10-CM | POA: Diagnosis present

## 2014-03-20 HISTORY — DX: Effusion, unspecified joint: M25.40

## 2014-03-20 HISTORY — DX: Dizziness and giddiness: R42

## 2014-03-20 HISTORY — DX: Headache: R51

## 2014-03-20 HISTORY — DX: Family history of other specified conditions: Z84.89

## 2014-03-20 HISTORY — DX: Pain in unspecified joint: M25.50

## 2014-03-20 LAB — BASIC METABOLIC PANEL WITH GFR
Anion gap: 25 — ABNORMAL HIGH (ref 5–15)
BUN: 66 mg/dL — ABNORMAL HIGH (ref 6–23)
CO2: 17 meq/L — ABNORMAL LOW (ref 19–32)
Calcium: 8.7 mg/dL (ref 8.4–10.5)
Chloride: 95 meq/L — ABNORMAL LOW (ref 96–112)
Creatinine, Ser: 12.79 mg/dL — ABNORMAL HIGH (ref 0.50–1.35)
GFR calc Af Amer: 5 mL/min — ABNORMAL LOW
GFR calc non Af Amer: 5 mL/min — ABNORMAL LOW
Glucose, Bld: 96 mg/dL (ref 70–99)
Potassium: 3.9 meq/L (ref 3.7–5.3)
Sodium: 137 meq/L (ref 137–147)

## 2014-03-20 LAB — SURGICAL PCR SCREEN
MRSA, PCR: NEGATIVE
Staphylococcus aureus: NEGATIVE

## 2014-03-20 LAB — CBC
HCT: 34.6 % — ABNORMAL LOW (ref 39.0–52.0)
Hemoglobin: 12 g/dL — ABNORMAL LOW (ref 13.0–17.0)
MCH: 31.3 pg (ref 26.0–34.0)
MCHC: 34.7 g/dL (ref 30.0–36.0)
MCV: 90.3 fL (ref 78.0–100.0)
Platelets: 184 K/uL (ref 150–400)
RBC: 3.83 MIL/uL — ABNORMAL LOW (ref 4.22–5.81)
RDW: 13.6 % (ref 11.5–15.5)
WBC: 8.4 K/uL (ref 4.0–10.5)

## 2014-03-20 LAB — TYPE AND SCREEN
ABO/RH(D): O NEG
Antibody Screen: NEGATIVE

## 2014-03-20 LAB — ABO/RH: ABO/RH(D): O NEG

## 2014-03-20 MED ORDER — CHLORHEXIDINE GLUCONATE 4 % EX LIQD
60.0000 mL | Freq: Once | CUTANEOUS | Status: DC
Start: 2014-03-20 — End: 2014-03-21

## 2014-03-20 MED ORDER — CHLORHEXIDINE GLUCONATE 4 % EX LIQD: 60.0000 mL | Freq: Once | CUTANEOUS | Status: DC

## 2014-03-20 NOTE — Progress Notes (Signed)
Pt doesn't have a cardiologist  Denies ever having an echo/stress test/heart cath  Medical Md is Dr.Edwin Avbuere with [facility]   EKG in epic from 01-10-14  Denies CXR in past yr

## 2014-03-20 NOTE — Pre-Procedure Instructions (Signed)
[name]  03/20/2014   Your procedure is scheduled on:  Tues, Aug 25 @ 7:30 AM  Report to [name]  at 5:30 AM.  Call this number if you have problems the morning of surgery: [phone]   Remember:   Do not eat food or drink liquids after midnight.   Take these medicines the morning of surgery with A SIP OF WATER: Omeprazole(Prilosec) and Pain Pill(if needed)               No Goody's,BC's,Aleve,Aspirin,Ibuprofen,Fish Oil,or any Herbal Medications   Do not wear jewelry  Do not wear lotions, powders, or colognes. You may wear deodorant.  Men may shave face and neck.  Do not bring valuables to the hospital.  [facility] [place] is not responsible                  for any belongings or valuables.               Contacts, dentures or bridgework may not be worn into surgery.  Leave suitcase in the car. After surgery it may be brought to your room.  For patients admitted to the hospital, discharge time is determined by your                treatment team.               Special Instructions:  [facility] - Preparing for Surgery  Before surgery, you can play an important role.  Because skin is not sterile, your skin needs to be as free of germs as possible.  You can reduce the number of germs on you skin by washing with CHG (chlorahexidine gluconate) soap before surgery.  CHG is an antiseptic cleaner which kills germs and bonds with the skin to continue killing germs even after washing.  Please DO NOT use if you have an allergy to CHG or antibacterial soaps.  If your skin becomes reddened/irritated stop using the CHG and inform your nurse when you arrive at Short Stay.  Do not shave (including legs and underarms) for at least 48 hours prior to the first CHG shower.  You may shave your face.  Please follow these instructions carefully:   1.  Shower with CHG Soap the night before surgery and the                                morning of Surgery.  2.  If you choose to wash your hair,  wash your hair first as usual with your       normal shampoo.  3.  After you shampoo, rinse your hair and body thoroughly to remove the                      Shampoo.  4.  Use CHG as you would any other liquid soap.  You can apply chg directly       to the skin and wash gently with scrungie or a clean washcloth.  5.  Apply the CHG Soap to your body ONLY FROM THE NECK DOWN.        Do not use on open wounds or open sores.  Avoid contact with your eyes,       ears, mouth and genitals (private parts).  Wash genitals (private parts)       with your normal soap.  6.  Wash thoroughly, paying special  attention to the area where your surgery        will be performed.  7.  Thoroughly rinse your body with warm water from the neck down.  8.  DO NOT shower/wash with your normal soap after using and rinsing off       the CHG Soap.  9.  Pat yourself dry with a clean towel.            10.  Wear clean pajamas.            11.  Place clean sheets on your bed the night of your first shower and do not        sleep with pets.  Day of Surgery  Do not apply any lotions/deoderants the morning of surgery.  Please wear clean clothes to the hospital/surgery center.     Please read over the following fact sheets that you were given: Pain Booklet, Coughing and Deep Breathing, Blood Transfusion Information, MRSA Information and Surgical Site Infection Prevention

## 2014-03-26 MED ORDER — DEXTROSE 5 % IV SOLN
3.0000 g | INTRAVENOUS | Status: AC
  Administered 2014-03-27: 3 g via INTRAVENOUS
  Filled 2014-03-26: qty 3000

## 2014-03-27 ENCOUNTER — Observation Stay (HOSPITAL_COMMUNITY)
Admission: RE | Admit: 2014-03-27 | Discharge: 2014-03-29 | Disposition: A | Discharge status: Home / Self Care | Payer: Medicare Other | Source: Ambulatory Visit | Attending: Orthopedic Surgery | Admitting: Orthopedic Surgery

## 2014-03-27 ENCOUNTER — Ambulatory Visit: Payer: Medicare Other | Admitting: Dietician

## 2014-03-27 ENCOUNTER — Encounter (HOSPITAL_COMMUNITY): Payer: Medicare Other | Admitting: Certified Registered"

## 2014-03-27 ENCOUNTER — Encounter (HOSPITAL_COMMUNITY): Payer: Self-pay | Admitting: *Deleted

## 2014-03-27 ENCOUNTER — Inpatient Hospital Stay (HOSPITAL_COMMUNITY): Payer: Medicare Other | Admitting: Certified Registered"

## 2014-03-27 ENCOUNTER — Encounter (HOSPITAL_COMMUNITY)
Admission: RE | Disposition: A | Discharge status: Home / Self Care | Payer: Self-pay | Source: Ambulatory Visit | Attending: Orthopedic Surgery

## 2014-03-27 DIAGNOSIS — N186 End stage renal disease: Secondary | ICD-10-CM | POA: Insufficient documentation

## 2014-03-27 DIAGNOSIS — Z992 Dependence on renal dialysis: Secondary | ICD-10-CM | POA: Insufficient documentation

## 2014-03-27 DIAGNOSIS — M899 Disorder of bone, unspecified: Secondary | ICD-10-CM | POA: Insufficient documentation

## 2014-03-27 DIAGNOSIS — D649 Anemia, unspecified: Secondary | ICD-10-CM | POA: Diagnosis not present

## 2014-03-27 DIAGNOSIS — Z79899 Other long term (current) drug therapy: Secondary | ICD-10-CM | POA: Insufficient documentation

## 2014-03-27 DIAGNOSIS — M1710 Unilateral primary osteoarthritis, unspecified knee: Secondary | ICD-10-CM | POA: Diagnosis present

## 2014-03-27 DIAGNOSIS — I120 Hypertensive chronic kidney disease with stage 5 chronic kidney disease or end stage renal disease: Secondary | ICD-10-CM | POA: Diagnosis not present

## 2014-03-27 DIAGNOSIS — N2581 Secondary hyperparathyroidism of renal origin: Secondary | ICD-10-CM | POA: Diagnosis not present

## 2014-03-27 DIAGNOSIS — M949 Disorder of cartilage, unspecified: Secondary | ICD-10-CM | POA: Diagnosis not present

## 2014-03-27 DIAGNOSIS — Z6841 Body Mass Index (BMI) 40.0 and over, adult: Secondary | ICD-10-CM | POA: Diagnosis not present

## 2014-03-27 DIAGNOSIS — F17200 Nicotine dependence, unspecified, uncomplicated: Secondary | ICD-10-CM | POA: Diagnosis not present

## 2014-03-27 DIAGNOSIS — K219 Gastro-esophageal reflux disease without esophagitis: Secondary | ICD-10-CM | POA: Insufficient documentation

## 2014-03-27 HISTORY — PX: TOTAL KNEE ARTHROPLASTY: SHX125

## 2014-03-27 LAB — POCT I-STAT 4, (NA,K, GLUC, HGB,HCT)
GLUCOSE: 104 mg/dL — AB (ref 70–99)
HEMATOCRIT: 39 % (ref 39.0–52.0)
Hemoglobin: 13.3 g/dL (ref 13.0–17.0)
Potassium: 3.3 mEq/L — ABNORMAL LOW (ref 3.7–5.3)
Sodium: 135 mEq/L — ABNORMAL LOW (ref 137–147)

## 2014-03-27 LAB — CBC
HEMATOCRIT: 32.9 % — AB (ref 39.0–52.0)
Hemoglobin: 11.3 g/dL — ABNORMAL LOW (ref 13.0–17.0)
MCH: 30.9 pg (ref 26.0–34.0)
MCHC: 34.3 g/dL (ref 30.0–36.0)
MCV: 89.9 fL (ref 78.0–100.0)
Platelets: 170 10*3/uL (ref 150–400)
RBC: 3.66 MIL/uL — ABNORMAL LOW (ref 4.22–5.81)
RDW: 13.6 % (ref 11.5–15.5)
WBC: 12.3 10*3/uL — ABNORMAL HIGH (ref 4.0–10.5)

## 2014-03-27 LAB — PROTIME-INR
INR: 1.07 (ref 0.00–1.49)
Prothrombin Time: 13.9 seconds (ref 11.6–15.2)

## 2014-03-27 LAB — APTT: aPTT: 42 seconds — ABNORMAL HIGH (ref 24–37)

## 2014-03-27 SURGERY — ARTHROPLASTY, KNEE, TOTAL
Anesthesia: General | Site: Knee | Laterality: Right

## 2014-03-27 MED ORDER — PHENYLEPHRINE 40 MCG/ML (10ML) SYRINGE FOR IV PUSH (FOR BLOOD PRESSURE SUPPORT)
PREFILLED_SYRINGE | INTRAVENOUS | Status: AC
  Filled 2014-03-27: qty 10

## 2014-03-27 MED ORDER — FENTANYL CITRATE 0.05 MG/ML IJ SOLN
INTRAMUSCULAR | Status: AC
  Filled 2014-03-27: qty 5

## 2014-03-27 MED ORDER — PROPOFOL 10 MG/ML IV BOLUS
INTRAVENOUS | Status: AC
  Filled 2014-03-27: qty 20

## 2014-03-27 MED ORDER — OXYCODONE HCL 5 MG PO TABS
ORAL_TABLET | ORAL | Status: AC
  Administered 2014-03-27: 5 mg via ORAL
  Filled 2014-03-27: qty 2

## 2014-03-27 MED ORDER — ALBUTEROL SULFATE HFA 108 (90 BASE) MCG/ACT IN AERS
INHALATION_SPRAY | RESPIRATORY_TRACT | Status: AC
  Filled 2014-03-27: qty 6.7

## 2014-03-27 MED ORDER — FENTANYL CITRATE 0.05 MG/ML IJ SOLN
INTRAMUSCULAR | Status: AC
  Administered 2014-03-27: 50 ug via INTRAVENOUS
  Filled 2014-03-27: qty 2

## 2014-03-27 MED ORDER — MORPHINE SULFATE 4 MG/ML IJ SOLN
INTRAMUSCULAR | Status: DC | PRN
  Administered 2014-03-27: 4 mg

## 2014-03-27 MED ORDER — PNEUMOCOCCAL VAC POLYVALENT 25 MCG/0.5ML IJ INJ
0.5000 mL | INJECTION | INTRAMUSCULAR | Status: DC
  Filled 2014-03-27: qty 0.5

## 2014-03-27 MED ORDER — CALCITRIOL 0.5 MCG PO CAPS
0.7500 ug | ORAL_CAPSULE | Freq: Every day | ORAL | Status: DC
  Administered 2014-03-28 – 2014-03-29 (×2): 0.75 ug via ORAL
  Filled 2014-03-27 (×2): qty 1

## 2014-03-27 MED ORDER — CLONIDINE HCL (ANALGESIA) 100 MCG/ML EP SOLN
150.0000 ug | Freq: Once | EPIDURAL | Status: DC
  Filled 2014-03-27: qty 1.5

## 2014-03-27 MED ORDER — SODIUM CHLORIDE 0.9 % IJ SOLN
INTRAMUSCULAR | Status: DC | PRN
  Administered 2014-03-27: 4 mL

## 2014-03-27 MED ORDER — BUPIVACAINE HCL (PF) 0.25 % IJ SOLN
INTRAMUSCULAR | Status: AC
  Filled 2014-03-27: qty 30

## 2014-03-27 MED ORDER — HEPARIN SODIUM (PORCINE) 5000 UNIT/ML IJ SOLN
5000.0000 [IU] | Freq: Three times a day (TID) | INTRAMUSCULAR | Status: DC
  Administered 2014-03-27 – 2014-03-29 (×5): 5000 [IU] via SUBCUTANEOUS
  Filled 2014-03-27 (×7): qty 1

## 2014-03-27 MED ORDER — ROCURONIUM BROMIDE 50 MG/5ML IV SOLN
INTRAVENOUS | Status: AC
  Filled 2014-03-27: qty 1

## 2014-03-27 MED ORDER — METOCLOPRAMIDE HCL 5 MG/ML IJ SOLN: 5.0000 mg | Freq: Three times a day (TID) | INTRAMUSCULAR | Status: DC | PRN

## 2014-03-27 MED ORDER — PHENYLEPHRINE HCL 10 MG/ML IJ SOLN
INTRAMUSCULAR | Status: DC | PRN
  Administered 2014-03-27 (×5): 80 ug via INTRAVENOUS

## 2014-03-27 MED ORDER — BUPIVACAINE-EPINEPHRINE (PF) 0.5% -1:200000 IJ SOLN
INTRAMUSCULAR | Status: DC | PRN
  Administered 2014-03-27: 30 mL via PERINEURAL

## 2014-03-27 MED ORDER — BUPIVACAINE-EPINEPHRINE (PF) 0.25% -1:200000 IJ SOLN
INTRAMUSCULAR | Status: AC
  Filled 2014-03-27: qty 30

## 2014-03-27 MED ORDER — PHENOL 1.4 % MT LIQD
1.0000 | OROMUCOSAL | Status: DC | PRN
  Administered 2014-03-28: 1 via OROMUCOSAL
  Filled 2014-03-27: qty 177

## 2014-03-27 MED ORDER — BUPIVACAINE-EPINEPHRINE (PF) 0.5% -1:200000 IJ SOLN
INTRAMUSCULAR | Status: AC
  Filled 2014-03-27: qty 30

## 2014-03-27 MED ORDER — GLYCOPYRROLATE 0.2 MG/ML IJ SOLN
INTRAMUSCULAR | Status: DC | PRN
  Administered 2014-03-27: .8 mg via INTRAVENOUS

## 2014-03-27 MED ORDER — SODIUM CHLORIDE 0.9 % IV SOLN: INTRAVENOUS | Status: AC

## 2014-03-27 MED ORDER — CLONIDINE HCL (ANALGESIA) 100 MCG/ML EP SOLN
EPIDURAL | Status: DC | PRN
  Administered 2014-03-27: 1 mL

## 2014-03-27 MED ORDER — LIDOCAINE HCL (CARDIAC) 20 MG/ML IV SOLN
INTRAVENOUS | Status: DC | PRN
  Administered 2014-03-27: 60 mg via INTRAVENOUS

## 2014-03-27 MED ORDER — HYDROMORPHONE HCL PF 1 MG/ML IJ SOLN
0.5000 mg | INTRAMUSCULAR | Status: DC | PRN
  Administered 2014-03-27 – 2014-03-29 (×10): 1 mg via INTRAVENOUS
  Filled 2014-03-27 (×8): qty 1

## 2014-03-27 MED ORDER — METHOCARBAMOL 500 MG PO TABS
ORAL_TABLET | ORAL | Status: AC
  Filled 2014-03-27: qty 1

## 2014-03-27 MED ORDER — DOCUSATE SODIUM 100 MG PO CAPS
100.0000 mg | ORAL_CAPSULE | Freq: Two times a day (BID) | ORAL | Status: DC
  Administered 2014-03-27 – 2014-03-29 (×4): 100 mg via ORAL
  Filled 2014-03-27 (×4): qty 1

## 2014-03-27 MED ORDER — LIDOCAINE HCL (CARDIAC) 20 MG/ML IV SOLN
INTRAVENOUS | Status: AC
  Filled 2014-03-27: qty 5

## 2014-03-27 MED ORDER — METOCLOPRAMIDE HCL 10 MG PO TABS
5.0000 mg | ORAL_TABLET | Freq: Three times a day (TID) | ORAL | Status: DC | PRN
  Administered 2014-03-28: 10 mg via ORAL
  Filled 2014-03-27: qty 1

## 2014-03-27 MED ORDER — EPINEPHRINE HCL 1 MG/ML IJ SOLN
INTRAMUSCULAR | Status: AC
  Filled 2014-03-27: qty 3

## 2014-03-27 MED ORDER — BUPIVACAINE LIPOSOME 1.3 % IJ SUSP
20.0000 mL | INTRAMUSCULAR | Status: DC
  Filled 2014-03-27: qty 20

## 2014-03-27 MED ORDER — ALBUMIN HUMAN 5 % IV SOLN
INTRAVENOUS | Status: DC | PRN
  Administered 2014-03-27: 11:00:00 via INTRAVENOUS

## 2014-03-27 MED ORDER — SODIUM CHLORIDE 0.9 % IV SOLN
INTRAVENOUS | Status: DC | PRN
  Administered 2014-03-27 (×3): via INTRAVENOUS

## 2014-03-27 MED ORDER — GLYCOPYRROLATE 0.2 MG/ML IJ SOLN
INTRAMUSCULAR | Status: AC
  Filled 2014-03-27: qty 4

## 2014-03-27 MED ORDER — ONDANSETRON HCL 4 MG/2ML IJ SOLN
INTRAMUSCULAR | Status: DC | PRN
  Administered 2014-03-27: 4 mg via INTRAVENOUS

## 2014-03-27 MED ORDER — OXYCODONE HCL 5 MG PO TABS
5.0000 mg | ORAL_TABLET | ORAL | Status: DC | PRN
  Administered 2014-03-27: 5 mg via ORAL
  Administered 2014-03-27 – 2014-03-29 (×12): 10 mg via ORAL
  Filled 2014-03-27 (×11): qty 2

## 2014-03-27 MED ORDER — CALCIUM CARBONATE ANTACID 500 MG PO CHEW
4.0000 | CHEWABLE_TABLET | Freq: Three times a day (TID) | ORAL | Status: DC
  Administered 2014-03-27 – 2014-03-29 (×6): 800 mg via ORAL
  Filled 2014-03-27 (×13): qty 4

## 2014-03-27 MED ORDER — ONDANSETRON HCL 4 MG/2ML IJ SOLN: 4.0000 mg | Freq: Four times a day (QID) | INTRAMUSCULAR | Status: DC | PRN

## 2014-03-27 MED ORDER — ONDANSETRON HCL 4 MG PO TABS: 4.0000 mg | ORAL_TABLET | Freq: Four times a day (QID) | ORAL | Status: DC | PRN

## 2014-03-27 MED ORDER — FENTANYL CITRATE 0.05 MG/ML IJ SOLN
INTRAMUSCULAR | Status: DC | PRN
  Administered 2014-03-27 (×5): 50 ug via INTRAVENOUS
  Administered 2014-03-27 (×3): 25 ug via INTRAVENOUS
  Administered 2014-03-27 (×4): 50 ug via INTRAVENOUS
  Administered 2014-03-27: 100 ug via INTRAVENOUS
  Administered 2014-03-27: 25 ug via INTRAVENOUS

## 2014-03-27 MED ORDER — MIDAZOLAM HCL 5 MG/5ML IJ SOLN
INTRAMUSCULAR | Status: DC | PRN
  Administered 2014-03-27: 1 mg via INTRAVENOUS

## 2014-03-27 MED ORDER — EPHEDRINE SULFATE 50 MG/ML IJ SOLN
INTRAMUSCULAR | Status: DC | PRN
  Administered 2014-03-27 (×3): 10 mg via INTRAVENOUS

## 2014-03-27 MED ORDER — BUPIVACAINE-EPINEPHRINE 0.25% -1:200000 IJ SOLN
INTRAMUSCULAR | Status: DC | PRN
  Administered 2014-03-27: 10 mL

## 2014-03-27 MED ORDER — ROCURONIUM BROMIDE 100 MG/10ML IV SOLN
INTRAVENOUS | Status: DC | PRN
  Administered 2014-03-27: 40 mg via INTRAVENOUS
  Administered 2014-03-27: 10 mg via INTRAVENOUS

## 2014-03-27 MED ORDER — PANTOPRAZOLE SODIUM 40 MG PO TBEC
40.0000 mg | DELAYED_RELEASE_TABLET | Freq: Every day | ORAL | Status: DC
  Administered 2014-03-27 – 2014-03-29 (×3): 40 mg via ORAL
  Filled 2014-03-27 (×3): qty 1

## 2014-03-27 MED ORDER — NEOSTIGMINE METHYLSULFATE 10 MG/10ML IV SOLN
INTRAVENOUS | Status: DC | PRN
  Administered 2014-03-27: 5 mg via INTRAVENOUS

## 2014-03-27 MED ORDER — PROPOFOL 10 MG/ML IV BOLUS
INTRAVENOUS | Status: DC | PRN
  Administered 2014-03-27: 170 mg via INTRAVENOUS
  Administered 2014-03-27: 30 mg via INTRAVENOUS

## 2014-03-27 MED ORDER — ADULT MULTIVITAMIN W/MINERALS CH: 1.0000 | ORAL_TABLET | Freq: Every day | ORAL | Status: DC

## 2014-03-27 MED ORDER — RENA-VITE PO TABS
1.0000 | ORAL_TABLET | Freq: Every day | ORAL | Status: DC
  Administered 2014-03-27: 1 via ORAL
  Administered 2014-03-28: 23:00:00 via ORAL
  Filled 2014-03-27 (×3): qty 1

## 2014-03-27 MED ORDER — EPHEDRINE SULFATE 50 MG/ML IJ SOLN
INTRAMUSCULAR | Status: AC
  Filled 2014-03-27: qty 2

## 2014-03-27 MED ORDER — ACETAMINOPHEN 650 MG RE SUPP: 650.0000 mg | Freq: Four times a day (QID) | RECTAL | Status: DC | PRN

## 2014-03-27 MED ORDER — ACETAMINOPHEN 325 MG PO TABS
650.0000 mg | ORAL_TABLET | Freq: Four times a day (QID) | ORAL | Status: DC | PRN
  Administered 2014-03-29 (×3): 650 mg via ORAL
  Filled 2014-03-27 (×3): qty 2

## 2014-03-27 MED ORDER — 0.9 % SODIUM CHLORIDE (POUR BTL) OPTIME
TOPICAL | Status: DC | PRN
  Administered 2014-03-27 (×2): 1000 mL

## 2014-03-27 MED ORDER — ONDANSETRON HCL 4 MG/2ML IJ SOLN
INTRAMUSCULAR | Status: AC
  Filled 2014-03-27: qty 2

## 2014-03-27 MED ORDER — ARTIFICIAL TEARS OP OINT
TOPICAL_OINTMENT | OPHTHALMIC | Status: DC | PRN
  Administered 2014-03-27: 1 via OPHTHALMIC

## 2014-03-27 MED ORDER — BUPIVACAINE HCL (PF) 0.25 % IJ SOLN
INTRAMUSCULAR | Status: DC | PRN
  Administered 2014-03-27: 10 mL

## 2014-03-27 MED ORDER — CALCIUM ACETATE 667 MG PO CAPS
3335.0000 mg | ORAL_CAPSULE | Freq: Three times a day (TID) | ORAL | Status: DC
  Administered 2014-03-27 – 2014-03-29 (×7): 3335 mg via ORAL
  Filled 2014-03-27 (×8): qty 5

## 2014-03-27 MED ORDER — EPINEPHRINE HCL 1 MG/ML IJ SOLN
INTRAMUSCULAR | Status: DC | PRN
  Administered 2014-03-27: 3 mL

## 2014-03-27 MED ORDER — METHOCARBAMOL 1000 MG/10ML IJ SOLN
500.0000 mg | Freq: Four times a day (QID) | INTRAMUSCULAR | Status: DC | PRN
  Filled 2014-03-27: qty 5

## 2014-03-27 MED ORDER — STERILE WATER FOR INJECTION IJ SOLN
INTRAMUSCULAR | Status: AC
  Filled 2014-03-27: qty 30

## 2014-03-27 MED ORDER — FENTANYL CITRATE 0.05 MG/ML IJ SOLN
25.0000 ug | INTRAMUSCULAR | Status: DC | PRN
  Administered 2014-03-27 (×4): 50 ug via INTRAVENOUS

## 2014-03-27 MED ORDER — MENTHOL 3 MG MT LOZG: 1.0000 | LOZENGE | OROMUCOSAL | Status: DC | PRN

## 2014-03-27 MED ORDER — DEXAMETHASONE SODIUM PHOSPHATE 10 MG/ML IJ SOLN
INTRAMUSCULAR | Status: DC | PRN
  Administered 2014-03-27: 4 mg via INTRAVENOUS

## 2014-03-27 MED ORDER — MORPHINE SULFATE 4 MG/ML IJ SOLN
INTRAMUSCULAR | Status: AC
  Filled 2014-03-27: qty 2

## 2014-03-27 MED ORDER — METHOCARBAMOL 500 MG PO TABS
500.0000 mg | ORAL_TABLET | Freq: Four times a day (QID) | ORAL | Status: DC | PRN
  Administered 2014-03-27 – 2014-03-29 (×6): 500 mg via ORAL
  Filled 2014-03-27 (×5): qty 1

## 2014-03-27 MED ORDER — MIDAZOLAM HCL 2 MG/2ML IJ SOLN
INTRAMUSCULAR | Status: AC
  Filled 2014-03-27: qty 2

## 2014-03-27 MED ORDER — ALBUTEROL SULFATE HFA 108 (90 BASE) MCG/ACT IN AERS
INHALATION_SPRAY | RESPIRATORY_TRACT | Status: DC | PRN
  Administered 2014-03-27: 6 via RESPIRATORY_TRACT

## 2014-03-27 SURGICAL SUPPLY — 75 items
BANDAGE ELASTIC 4 VELCRO ST LF (GAUZE/BANDAGES/DRESSINGS) ×2 IMPLANT
BANDAGE ELASTIC 6 VELCRO ST LF (GAUZE/BANDAGES/DRESSINGS) ×2 IMPLANT
BANDAGE ESMARK 6X9 LF (GAUZE/BANDAGES/DRESSINGS) ×1 IMPLANT
BLADE SAG 18X100X1.27 (BLADE) ×2 IMPLANT
BLADE SAW SGTL 13.0X1.19X90.0M (BLADE) ×2 IMPLANT
BNDG COHESIVE 6X5 TAN STRL LF (GAUZE/BANDAGES/DRESSINGS) ×2 IMPLANT
BNDG ELASTIC 6X10 VLCR STRL LF (GAUZE/BANDAGES/DRESSINGS) ×6 IMPLANT
BNDG ESMARK 6X9 LF (GAUZE/BANDAGES/DRESSINGS) ×2
BOWL SMART MIX CTS (DISPOSABLE) ×2 IMPLANT
CAPT KNEE LEVEL 3 BIOMET ×2 IMPLANT
CEMENT BONE COBALT (Cement) ×4 IMPLANT
COVER SURGICAL LIGHT HANDLE (MISCELLANEOUS) ×2 IMPLANT
CUFF TOURNIQUET SINGLE 34IN LL (TOURNIQUET CUFF) IMPLANT
CUFF TOURNIQUET SINGLE 44IN (TOURNIQUET CUFF) ×2 IMPLANT
DRAPE INCISE IOBAN 66X45 STRL (DRAPES) IMPLANT
DRAPE ORTHO SPLIT 77X108 STRL (DRAPES) ×3
DRAPE SURG ORHT 6 SPLT 77X108 (DRAPES) ×3 IMPLANT
DRAPE U-SHAPE 47X51 STRL (DRAPES) ×2 IMPLANT
DRSG PAD ABDOMINAL 8X10 ST (GAUZE/BANDAGES/DRESSINGS) ×2 IMPLANT
DURAPREP 26ML APPLICATOR (WOUND CARE) ×2 IMPLANT
ELECT REM PT RETURN 9FT ADLT (ELECTROSURGICAL) ×2
ELECTRODE REM PT RTRN 9FT ADLT (ELECTROSURGICAL) ×1 IMPLANT
FACESHIELD WRAPAROUND (MASK) ×2 IMPLANT
GAUZE SPONGE 4X4 12PLY STRL (GAUZE/BANDAGES/DRESSINGS) ×2 IMPLANT
GAUZE XEROFORM 1X8 LF (GAUZE/BANDAGES/DRESSINGS) ×2 IMPLANT
GAUZE XEROFORM 5X9 LF (GAUZE/BANDAGES/DRESSINGS) ×2 IMPLANT
GLOVE BIOGEL PI IND STRL 7.5 (GLOVE) ×1 IMPLANT
GLOVE BIOGEL PI IND STRL 8 (GLOVE) ×1 IMPLANT
GLOVE BIOGEL PI INDICATOR 7.5 (GLOVE) ×1
GLOVE BIOGEL PI INDICATOR 8 (GLOVE) ×1
GLOVE ECLIPSE 7.0 STRL STRAW (GLOVE) ×4 IMPLANT
GLOVE SURG ORTHO 8.0 STRL STRW (GLOVE) ×2 IMPLANT
GOWN STRL REUS W/ TWL LRG LVL3 (GOWN DISPOSABLE) ×3 IMPLANT
GOWN STRL REUS W/ TWL XL LVL3 (GOWN DISPOSABLE) ×1 IMPLANT
GOWN STRL REUS W/TWL LRG LVL3 (GOWN DISPOSABLE) ×3
GOWN STRL REUS W/TWL XL LVL3 (GOWN DISPOSABLE) ×1
HANDPIECE INTERPULSE COAX TIP (DISPOSABLE) ×1
HOOD PEEL AWAY FACE SHEILD DIS (HOOD) ×6 IMPLANT
IMMOBILIZER KNEE 20 (SOFTGOODS) IMPLANT
IMMOBILIZER KNEE 22 UNIV (SOFTGOODS) ×2 IMPLANT
IMMOBILIZER KNEE 24 THIGH 36 (MISCELLANEOUS) IMPLANT
IMMOBILIZER KNEE 24 UNIV (MISCELLANEOUS)
KIT BASIN OR (CUSTOM PROCEDURE TRAY) ×2 IMPLANT
KIT ROOM TURNOVER OR (KITS) ×2 IMPLANT
MANIFOLD NEPTUNE II (INSTRUMENTS) ×2 IMPLANT
NEEDLE 18GX1X1/2 (RX/OR ONLY) (NEEDLE) ×2 IMPLANT
NEEDLE 21X1 OR PACK (NEEDLE) ×2 IMPLANT
NEEDLE HYPO 22GX1.5 SAFETY (NEEDLE) ×2 IMPLANT
NEEDLE SPNL 18GX3.5 QUINCKE PK (NEEDLE) ×2 IMPLANT
NS IRRIG 1000ML POUR BTL (IV SOLUTION) ×4 IMPLANT
PACK TOTAL JOINT (CUSTOM PROCEDURE TRAY) ×2 IMPLANT
PAD ARMBOARD 7.5X6 YLW CONV (MISCELLANEOUS) ×4 IMPLANT
PAD CAST 4YDX4 CTTN HI CHSV (CAST SUPPLIES) ×1 IMPLANT
PADDING CAST COTTON 4X4 STRL (CAST SUPPLIES) ×1
PADDING CAST COTTON 6X4 STRL (CAST SUPPLIES) ×2 IMPLANT
RUBBERBAND STERILE (MISCELLANEOUS) IMPLANT
SAWBLADE OXFORD PARTIAL (BLADE) ×2 IMPLANT
SET HNDPC FAN SPRY TIP SCT (DISPOSABLE) ×1 IMPLANT
SPONGE GAUZE 4X4 12PLY STER LF (GAUZE/BANDAGES/DRESSINGS) ×2 IMPLANT
SPONGE LAP 18X18 X RAY DECT (DISPOSABLE) IMPLANT
STAPLER VISISTAT 35W (STAPLE) ×2 IMPLANT
SUCTION FRAZIER TIP 10 FR DISP (SUCTIONS) ×2 IMPLANT
SUT VIC AB 0 CTB1 27 (SUTURE) ×6 IMPLANT
SUT VIC AB 1 CT1 27 (SUTURE) ×5
SUT VIC AB 1 CT1 27XBRD ANBCTR (SUTURE) ×5 IMPLANT
SUT VIC AB 2-0 CT1 27 (SUTURE) ×2
SUT VIC AB 2-0 CT1 TAPERPNT 27 (SUTURE) ×2 IMPLANT
SYR 30ML LL (SYRINGE) ×2 IMPLANT
SYR 30ML SLIP (SYRINGE) ×2 IMPLANT
SYR 50ML LL SCALE MARK (SYRINGE) ×2 IMPLANT
SYR TB 1ML LUER SLIP (SYRINGE) ×2 IMPLANT
TOWEL OR 17X24 6PK STRL BLUE (TOWEL DISPOSABLE) ×4 IMPLANT
TOWEL OR 17X26 10 PK STRL BLUE (TOWEL DISPOSABLE) ×4 IMPLANT
TRAY FOLEY CATH 16FRSI W/METER (SET/KITS/TRAYS/PACK) ×2 IMPLANT
WATER STERILE IRR 1000ML POUR (IV SOLUTION) ×4 IMPLANT

## 2014-03-27 NOTE — Brief Op Note (Signed)
03/27/2014  11:29 AM  PATIENT:  [name]  28 y.o. male  PRE-OPERATIVE DIAGNOSIS:  RIGHT KNEE OSTEOARTHRITIS  POST-OPERATIVE DIAGNOSIS:  RIGHT KNEE OSTEOARTHRITIS  PROCEDURE:  Procedure(s): UNICOMPARTMENTAL ARTHROPLASTY  SURGEON:  Surgeon(s): Meredith Pel, MD  ASSISTANT: S vernon pa  ANESTHESIA:   regional and general  EBL: 50 ml    Total I/O In: 1250 [I.V.:1000; IV Piggyback:250] Out: 150 [Blood:150]  BLOOD ADMINISTERED: none  DRAINS: none   LOCAL MEDICATIONS USED:  exparel marcaine morphine clonidine  SPECIMEN:  No Specimen  COUNTS:  YES  TOURNIQUET:   Total Tourniquet Time Documented: Thigh (Right) - 119 minutes Total: Thigh (Right) - 119 minutes   DICTATION: .Other Dictation: Dictation Number [id number]  PLAN OF CARE: Admit for overnight observation  PATIENT DISPOSITION:  PACU - hemodynamically stable

## 2014-03-27 NOTE — Plan of Care (Signed)
Problem: Consults Goal: Diagnosis- Total Joint Replacement Primary Total Knee Unicompartmental Right knee

## 2014-03-27 NOTE — Evaluation (Signed)
Physical Therapy Evaluation Patient Details Name: Rufe Rutkowski MRN: [id number] DOB: [birth year] Today's Date: 03/27/2014   History of Present Illness  Pt admitted for R unicompartment TKA. Pt with h/o morbid obesity and R knee arthritis.  Clinical Impression  Pt is s/p TKA resulting in the deficits listed below (see PT Problem List). Pt did transfer OOB to chair this date however extremely limited by R knee pain. Pt has received all pain medicine he was allowed prior to treatment however pt still with tears. Pt will benefit from skilled PT to increase their independence and safety with mobility to allow discharge to the venue listed below.      Follow Up Recommendations Home health PT;Supervision/Assistance - 24 hour    Equipment Recommendations  Rolling walker with 5" wheels;3in1 (PT) (Pt 300#s, needs wide RW)    Recommendations for Other Services       Precautions / Restrictions Precautions Precautions: Knee Required Braces or Orthoses: Knee Immobilizer - Right Knee Immobilizer - Right: On when out of bed or walking Restrictions Weight Bearing Restrictions: Yes RLE Weight Bearing: Weight bearing as tolerated      Mobility  Bed Mobility Overal bed mobility: Needs Assistance Bed Mobility: Supine to Sit     Supine to sit: Mod assist     General bed mobility comments: assist for initial trunk elevation and R LE management  Transfers Overall transfer level: Needs assistance Equipment used: Rolling walker (2 wheeled) Transfers: Sit to/from [facility] Sit to Stand: Mod assist;+2 safety/equipment Stand pivot transfers: Min assist;+2 safety/equipment       General transfer comment: PT to have to hold R LE during sit to stand, 2nd person to assist with initial anterior forward transfer. pt with increased bilat UE WBing to offweight R LE during std pvt  Ambulation/Gait                Stairs            Wheelchair Mobility    Modified Rankin  (Stroke Patients Only)       Balance Overall balance assessment:  (requires use of RW for safe standing)                                           Pertinent Vitals/Pain Pain Assessment: 0-10 Pain Score: 10-Worst pain ever Pain Location: R knee Pain Descriptors / Indicators: Aching;Burning;Constant Pain Intervention(s): Limited activity within patient's tolerance;Premedicated before session;Ice applied    Home Living Family/patient expects to be discharged to:: Private residence Living Arrangements:  (cousin) Available Help at Discharge: Family;Available 24 hours/day Type of Home: House Home Access: Ramped entrance     Home Layout: One level Home Equipment: None      Prior Function Level of Independence: Independent               Hand Dominance   Dominant Hand: Right    Extremity/Trunk Assessment   Upper Extremity Assessment: Overall WFL for tasks assessed           Lower Extremity Assessment: RLE deficits/detail RLE Deficits / Details: able to initiate R quad set and knee flexion but greatly limited by pain    Cervical / Trunk Assessment: Normal  Communication   Communication: No difficulties  Cognition Arousal/Alertness: Awake/alert Behavior During Therapy: WFL for tasks assessed/performed Overall Cognitive Status: Within Functional Limits for tasks assessed  General Comments      Exercises Total Joint Exercises Ankle Circles/Pumps: AROM;Left;Supine;10 reps (limited to wiggling toes on R due to pain) Quad Sets: AROM;Right;5 reps;Supine Heel Slides: AAROM;Right;5 reps;Seated      Assessment/Plan    PT Assessment Patient needs continued PT services  PT Diagnosis Difficulty walking;Acute pain   PT Problem List Decreased strength;Decreased range of motion;Decreased activity tolerance;Decreased balance;Decreased mobility;Decreased knowledge of use of DME  PT Treatment Interventions DME  instruction;Gait training;Stair training;Functional mobility training;Therapeutic activities;Therapeutic exercise   PT Goals (Current goals can be found in the Care Plan section) Acute Rehab PT Goals Patient Stated Goal: home PT Goal Formulation: With patient Time For Goal Achievement: 04/03/14 Potential to Achieve Goals: Good    Frequency 7X/week   Barriers to discharge        Co-evaluation               End of Session Equipment Utilized During Treatment: Gait belt;Right knee immobilizer Activity Tolerance: Patient limited by pain Patient left: in chair;with call bell/phone within reach Nurse Communication: Mobility status    Functional Assessment Tool Used: clinical judgment Functional Limitation: Mobility: Walking and moving around Mobility: Walking and Moving Around Current Status [id number]): At least 60 percent but less than 80 percent impaired, limited or restricted Mobility: Walking and Moving Around Goal Status [id number]): At least 1 percent but less than 20 percent impaired, limited or restricted    Time: [id number] PT Time Calculation (min): 26 min   Charges:   PT Evaluation $Initial PT Evaluation Tier I: 1 Procedure PT Treatments $Therapeutic Activity: 8-22 mins   PT G Codes:   Functional Assessment Tool Used: clinical judgment Functional Limitation: Mobility: Walking and moving around    [place], Knute Neu 03/27/2014, 5:14 PM  Kittie Plater, PT, DPT Pager #: [phone] Office #: [phone]

## 2014-03-27 NOTE — Progress Notes (Signed)
Orthopedic Tech Progress Note Patient Details:  [name] 12-25-1985 [id number] CPM applied to RLE with appropriate settings. OHF applied to bed. CPM Right Knee CPM Right Knee: On Right Knee Flexion (Degrees): 40 Right Knee Extension (Degrees): 10   Asia R Thompson 03/27/2014, 12:33 PM

## 2014-03-27 NOTE — Anesthesia Postprocedure Evaluation (Signed)
  Anesthesia Post-op Note  Patient: [name]  Procedure(s) Performed: Procedure(s): UNICOMPARTMENTAL ARTHROPLASTY (Right)  Patient Location: PACU  Anesthesia Type:General  Level of Consciousness: awake  Airway and Oxygen Therapy: Patient Spontanous Breathing  Post-op Pain: mild  Post-op Assessment: Post-op Vital signs reviewed  Post-op Vital Signs: Reviewed  Last Vitals:  Filed Vitals:   03/27/14 1230  BP: 107/46  Pulse: 91  Temp:   Resp: 19    Complications: No apparent anesthesia complications

## 2014-03-27 NOTE — Progress Notes (Signed)
Orthopedic Tech Progress Note Patient Details:  [name] Feb 22, 1986 [id number] On cpm at 7:40 pm RLE 0-40 Patient ID: [name], male   DOB: [birth year], 28 y.o.   MRN: [id number]   Braulio Bosch 03/27/2014, 7:42 PM

## 2014-03-27 NOTE — Anesthesia Preprocedure Evaluation (Addendum)
Anesthesia Evaluation  Patient identified by MRN, date of birth, ID band Patient awake    Reviewed: Allergy & Precautions, H&P , NPO status , Patient's Chart, lab work & pertinent test results  History of Anesthesia Complications (+) Family history of anesthesia reactionNegative for: history of anesthetic complications  Airway Mallampati: I TM Distance: >3 FB Neck ROM: Full    Dental  (+) Teeth Intact, Dental Advisory Given   Pulmonary Current Smoker,  breath sounds clear to auscultation        Cardiovascular hypertension (controlled with dialysis), - anginaRhythm:Regular Rate:Normal     Neuro/Psych negative neurological ROS     GI/Hepatic Neg liver ROS, GERD-  Medicated and Poorly Controlled,  Endo/Other  Morbid obesity  Renal/GU ESRF and DialysisRenal disease (K+ 3.3)MWF HD patient. Last HD 03-26-14     Musculoskeletal   Abdominal (+) + obese,   Peds  Hematology   Anesthesia Other Findings   Reproductive/Obstetrics                       Anesthesia Physical Anesthesia Plan  ASA: III  Anesthesia Plan: General   Post-op Pain Management:    Induction: Intravenous  Airway Management Planned: Oral ETT  Additional Equipment:   Intra-op Plan:   Post-operative Plan: Extubation in OR  Informed Consent:   Dental advisory given  Plan Discussed with: CRNA, Anesthesiologist and Surgeon  Anesthesia Plan Comments: (Plan routine monitors, GETA with adductor canal block for post op analgesia)       Anesthesia Quick Evaluation

## 2014-03-27 NOTE — Consult Note (Signed)
[facility] Renal Consultation Note  Indication for Consultation:  Management of ESRD/hemodialysis; anemia, hypertension/volume and secondary hyperparathyroidism  HPI: [name] is a 28 y.o. male with ESRD on dialysis at the [facility], morbid obesity, and a long history of right knee arthritic pain, worsening in severity and recently failing conservative management with injections and therapy, who presented today for scheduled surgery per Dr. Marlou Sa.  Since the arthritis is limited to the lateral compartment, unicompartmental arthroplasty was determined to be the best option.  He was seen post-surgery and is still in significant pain.  He will require his scheduled hemodialysis tomorrow while at the hospital.  Dialysis Orders:  MWF @ [facility] 4:30      125 kg     400/800       2K/3.5Ca     AVF @ RFA      Heparin 12,000 U  Calcitriol 0.75 mcg      No Aranesp or Venofer  Past Medical History  Diagnosis Date  . Hyperparathyroidism   . Morbid obesity   . Arthritis   . Complication of anesthesia     A little while to wake up after knee surgery in 2008  . Family history of anesthesia complication     mom slow to wake up  . Headache(784.0)     occasionally  . Dizziness     when coming off of dialysis  . Joint pain   . Joint swelling   . GERD (gastroesophageal reflux disease)     takes Omeprazole as needed  . Renal disorder     MWF and goes to [facility]   Past Surgical History  Procedure Laterality Date  . Av fistula placement Bilateral     Uses Right arm  . Esophagogastroduodenoscopy endoscopy    . Knee arthroscopy Left 2007  . Parathyroidectomy N/A 03/30/2013    Procedure: TOTAL PARATHYROIDECTOMY WITH AUTOTRANSPLANT;  Surgeon: Earnstine Regal, MD;  Location: [place];  Service: General;  Laterality: N/A;  Autotransplant to left lower arm.   History reviewed. No pertinent family history.  Social History reports that he has been smoking Cigarettes and has a  2.6 pack-year smoking history. He does not have any smokeless tobacco history on file. He reports that he does not drink alcohol or use illicit drugs.  No Known Allergies Prior to Admission medications   Medication Sig Start Date End Date Taking? Authorizing Provider  calcium acetate (PHOSLO) 667 MG capsule Take 3,335 mg by mouth 3 (three) times daily with meals.    Yes Historical Provider, MD  calcium carbonate (TUMS - DOSED IN MG ELEMENTAL CALCIUM) 500 MG chewable tablet Chew 4 tablets by mouth 3 (three) times daily with meals.    Yes Historical Provider, MD  Multiple Vitamin (MULTIVITAMIN WITH MINERALS) TABS tablet Take 1 tablet by mouth daily.   Yes Historical Provider, MD  omeprazole (PRILOSEC) 20 MG capsule Take 20 mg by mouth daily as needed (for heartburn).    Yes Historical Provider, MD  acetaminophen-codeine (TYLENOL #3) 300-30 MG per tablet Take 1 tablet by mouth every 4 (four) hours as needed for moderate pain.    Historical Provider, MD   Labs:  Results for orders placed during the hospital encounter of 03/27/14 (from the past 48 hour(s))  POCT I-STAT 4, (NA,K, GLUC, HGB,HCT)     Status: Abnormal   Collection Time    03/27/14  6:06 AM      Result Value Ref Range   Sodium 135 (*)  137 - 147 mEq/L   Potassium 3.3 (*) 3.7 - 5.3 mEq/L   Glucose, Bld 104 (*) 70 - 99 mg/dL   HCT 39.0  39.0 - 52.0 %   Hemoglobin 13.3  13.0 - 17.0 g/dL  PROTIME-INR     Status: None   Collection Time    03/27/14  6:15 AM      Result Value Ref Range   Prothrombin Time 13.9  11.6 - 15.2 seconds   INR 1.07  0.00 - 1.49  APTT     Status: Abnormal   Collection Time    03/27/14  6:15 AM      Result Value Ref Range   aPTT 42 (*) 24 - 37 seconds   Comment:            IF BASELINE aPTT IS ELEVATED,     SUGGEST PATIENT RISK ASSESSMENT     BE USED TO DETERMINE APPROPRIATE     ANTICOAGULANT THERAPY.   Constitutional: negative for chills, fatigue, fevers and sweats Ears, nose, mouth, throat, and face:  negative for earaches, hoarseness, nasal congestion and sore throat Respiratory: negative for cough, dyspnea on exertion, hemoptysis and sputum Cardiovascular: negative for chest pain, chest pressure/discomfort, dyspnea, orthopnea and palpitations Gastrointestinal: negative for abdominal pain, change in bowel habits, nausea and vomiting Genitourinary:negative, anuric Musculoskeletal:positive for right knee pain s/p surgery, negative for back pain, myalgias and neck pain Neurological: negative for dizziness, headaches, paresthesia and speech problems  Physical Exam: Filed Vitals:   03/27/14 1345  BP: 125/53  Pulse: 88  Temp:   Resp: 22     General appearance: alert, cooperative and moderate distress Head: Normocephalic, without obvious abnormality, atraumatic Neck: no adenopathy, no carotid bruit, no JVD and supple, symmetrical, trachea midline Resp: clear to auscultation bilaterally Cardio: regular rate and rhythm, S1, S2 normal, no murmur, click, rub or gallop GI: soft, non-tender; bowel sounds normal; no masses,  no organomegaly Extremities: R knee in immobilizer, no edema on L Neurologic: Grossly normal Dialysis Access: AVF @ RFA with + bruit   Assessment/Plan: 1. Severe R knee arthritis - s/p unicompartmental arthroplasty by Dr. Marlou Sa today, stable. 2. ESRD - HD on MWF @ [place], K 3.9.  HD tomorrow. 3. Hypertension/volume - BP 125/53, no meds; 4.7 over EDW, but rarely reaches UF goal (post-HD wt 126.2 on 8/24).  4. Anemia - Hgb 13.3, no Aranesp or Fe. 5. 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 6. Nutrition - Last Alb 4, renal diet, multivitamin. 7. Vascular access- R cimino AVF with diminished thrill but + bruit.  LYLES,CHARLES 03/27/2014, 3:05 PM   Attending Nephrologist:  Donato Heinz, MD    I have seen and examined this patient and agree with plan as outlined by C.  Rick Duff A,MD 03/27/2014 4:07 PM

## 2014-03-27 NOTE — Anesthesia Procedure Notes (Addendum)
Anesthesia Regional Block:  Adductor canal block  Pre-Anesthetic Checklist: ,, timeout performed, Correct Patient, Correct Site, Correct Laterality, Correct Procedure, Correct Position, site marked, Risks and benefits discussed,  Surgical consent,  Pre-op evaluation,  At surgeon's request and post-op pain management  Laterality: Right and Lower  Prep: chloraprep       Needles:  Injection technique: Single-shot  Needle Type: Echogenic Stimulator Needle     Needle Length: 9cm 9 cm Needle Gauge: 22 and 22 G    Additional Needles:  Procedures: ultrasound guided (picture in chart) and nerve stimulator Adductor canal block  Nerve Stimulator or Paresthesia:  Response: patella twitch, 0.5 mA, 0.1 ms,   Additional Responses:   Narrative:  Start time: 03/27/2014 7:22 AM End time: 03/27/2014 7:31 AM Injection made incrementally with aspirations every 5 mL.  Performed by: Personally  Anesthesiologist: Jenita Seashore, MD  Additional Notes: Pt identified in Holding room.  Monitors applied. Working IV access confirmed. Sterile prep R thigh.  #22ga echogenic into adductor canal, twitch at 0.29mA threshold with US guidance.  30cc 0.5% Bupivacaine with 1:200k epi injected incrementally after negative test dose, good spread around nerve and artery.  Patient asymptomatic, VSS, no heme aspirated, tolerated well.  Jenita Seashore, MD   Procedure Name: Intubation Date/Time: 03/27/2014 7:46 AM Performed by: Ollen Bowl Pre-anesthesia Checklist: Patient identified, Emergency Drugs available, Suction available, Patient being monitored and Timeout performed Patient Re-evaluated:Patient Re-evaluated prior to inductionOxygen Delivery Method: Circle system utilized and Simple face mask Preoxygenation: Pre-oxygenation with 100% oxygen Intubation Type: IV induction Ventilation: Mask ventilation with difficulty and Oral airway inserted - appropriate to patient size Laryngoscope Size: Sabra Heck and 3 Grade  View: Grade II Tube type: Oral Tube size: 7.5 mm Number of attempts: 1 Airway Equipment and Method: Patient positioned with wedge pillow and Stylet Placement Confirmation: ETT inserted through vocal cords under direct vision,  positive ETCO2 and breath sounds checked- equal and bilateral Secured at: 23 cm Tube secured with: Tape Dental Injury: Teeth and Oropharynx as per pre-operative assessment

## 2014-03-27 NOTE — H&P (Signed)
[name] is an 28 y.o. male.   Chief Complaint: Right knee pain HPI: Chest and is a 28 year old patient with long history of right knee pain. The pain is becoming debilitating. He describes significant pain with activities of daily living. He describes night pain and rest pain. Pain which interferes with his ability to ambulate. He is failed conservative management including injections and therapeutic exercises. He describes significant symptoms which are worsening in severity. Patient does have significant medical comorbidities including end-stage renal disease on dialysis and morbid obesity. No family history of DVT or pulmonary embolism.  Past Medical History  Diagnosis Date  . Hyperparathyroidism   . Morbid obesity   . Arthritis   . Complication of anesthesia     A little while to wake up after knee surgery in 2008  . Family history of anesthesia complication     mom slow to wake up  . Headache(784.0)     occasionally  . Dizziness     when coming off of dialysis  . Joint pain   . Joint swelling   . GERD (gastroesophageal reflux disease)     takes Omeprazole as needed  . Renal disorder     MWF and goes to [facility]    Past Surgical History  Procedure Laterality Date  . Av fistula placement Bilateral     Uses Right arm  . Esophagogastroduodenoscopy endoscopy    . Knee arthroscopy Left 2007  . Parathyroidectomy N/A 03/30/2013    Procedure: TOTAL PARATHYROIDECTOMY WITH AUTOTRANSPLANT;  Surgeon: Earnstine Regal, MD;  Location: [place];  Service: General;  Laterality: N/A;  Autotransplant to left lower arm.    History reviewed. No pertinent family history. Social History:  reports that he has been smoking Cigarettes.  He has a 2.6 pack-year smoking history. He does not have any smokeless tobacco history on file. He reports that he does not drink alcohol or use illicit drugs.  Allergies: No Known Allergies  Medications Prior to Admission  Medication Sig Dispense Refill  .  calcium acetate (PHOSLO) 667 MG capsule Take 3,335 mg by mouth 3 (three) times daily with meals.       . calcium carbonate (TUMS - DOSED IN MG ELEMENTAL CALCIUM) 500 MG chewable tablet Chew 4 tablets by mouth 3 (three) times daily with meals.       . Multiple Vitamin (MULTIVITAMIN WITH MINERALS) TABS tablet Take 1 tablet by mouth daily.      Marland Kitchen omeprazole (PRILOSEC) 20 MG capsule Take 20 mg by mouth daily as needed (for heartburn).       Marland Kitchen acetaminophen-codeine (TYLENOL #3) 300-30 MG per tablet Take 1 tablet by mouth every 4 (four) hours as needed for moderate pain.        Results for orders placed during the hospital encounter of 03/27/14 (from the past 48 hour(s))  POCT I-STAT 4, (NA,K, GLUC, HGB,HCT)     Status: Abnormal   Collection Time    03/27/14  6:06 AM      Result Value Ref Range   Sodium 135 (*) 137 - 147 mEq/L   Potassium 3.3 (*) 3.7 - 5.3 mEq/L   Glucose, Bld 104 (*) 70 - 99 mg/dL   HCT 39.0  39.0 - 52.0 %   Hemoglobin 13.3  13.0 - 17.0 g/dL  PROTIME-INR     Status: None   Collection Time    03/27/14  6:15 AM      Result Value Ref Range   Prothrombin Time  13.9  11.6 - 15.2 seconds   INR 1.07  0.00 - 1.49  APTT     Status: Abnormal   Collection Time    03/27/14  6:15 AM      Result Value Ref Range   aPTT 42 (*) 24 - 37 seconds   Comment:            IF BASELINE aPTT IS ELEVATED,     SUGGEST PATIENT RISK ASSESSMENT     BE USED TO DETERMINE APPROPRIATE     ANTICOAGULANT THERAPY.   No results found.  Review of Systems  Constitutional: Negative.   HENT: Negative.   Eyes: Negative.   Respiratory: Negative.   Cardiovascular: Negative.   Gastrointestinal: Negative.   Genitourinary: Negative.   Musculoskeletal: Positive for joint pain.  Skin: Negative.   Neurological: Negative.   Endo/Heme/Allergies: Negative.   Psychiatric/Behavioral: Negative.     Blood pressure 113/43, pulse 86, temperature 97.6 F (36.4 C), temperature source Oral, resp. rate 20, height 5\' 6"   (1.676 m), weight 129.729 kg (286 lb), SpO2 100.00%. Physical Exam  Constitutional: He appears well-developed.  HENT:  Head: Normocephalic.  Eyes: Pupils are equal, round, and reactive to light.  Neck: Normal range of motion.  Cardiovascular: Normal rate.   Respiratory: Effort normal.  GI: Soft.  Neurological: He is alert.  Skin: Skin is warm.  Psychiatric: He has a normal mood and affect.   examination the right lower extremity demonstrates that the alignment bilaterally pedal pulses intact range of motion is full in terms of full extension and 125 of flexion he has lateral joint line tenderness. He does have a correctable deformity and 20 of flexion. He has no medial sided tenderness or patellofemoral crepitus.  Assessment/Plan Impression is right knee arthritis. On exam and by MRI scan the arthritis is limited to the lateral compartment. Stress radiographs done in the office do show correctable deformity with expansion of the lateral joint space with varus stress and collapse of the joint space without distress. Patient has end-stage arthritis in the lateral compartment and has not a great candidate for any type of reconstructive procedure. Due to the severity of the arthritis I don't think an osteotomy on the femoral side this is best option. I think his best option for pain relief which is what he is most imaged in his unicompartmental arthroplasty. Risk and benefits are discussed with the patient including but not limited to infection nerve vessel damage high likelihood of revision in his lifetime. He has seen the dentist recently and his dental care is optimized. Again because of his obesity and other medical conditions no operation is ideal for him at this time however I think this procedure will give him the best chance at pain relief. The option to convert to total knee replacement I said is also discussed with the patient in case anything unexpected is found in terms of where in the  other compartments at the time of surgery. Patient understands and wished to proceed. All questions answered.  [name] 03/27/2014, 7:19 AM

## 2014-03-27 NOTE — Transfer of Care (Signed)
Immediate Anesthesia Transfer of Care Note  Patient: [name]  Procedure(s) Performed: Procedure(s): UNICOMPARTMENTAL ARTHROPLASTY (Right)  Patient Location: PACU  Anesthesia Type:GA combined with regional for post-op pain  Level of Consciousness: awake, alert  and oriented  Airway & Oxygen Therapy: Patient Spontanous Breathing and Patient connected to face mask oxygen  Post-op Assessment: Report given to PACU RN, Post -op Vital signs reviewed and stable and Patient moving all extremities X 4  Post vital signs: Reviewed and stable  Complications: No apparent anesthesia complications

## 2014-03-28 ENCOUNTER — Encounter (HOSPITAL_COMMUNITY): Payer: Self-pay | Admitting: Orthopedic Surgery

## 2014-03-28 DIAGNOSIS — M1710 Unilateral primary osteoarthritis, unspecified knee: Secondary | ICD-10-CM | POA: Diagnosis not present

## 2014-03-28 LAB — RENAL FUNCTION PANEL
Albumin: 3.5 g/dL (ref 3.5–5.2)
Anion gap: 25 — ABNORMAL HIGH (ref 5–15)
BUN: 66 mg/dL — AB (ref 6–23)
CO2: 20 meq/L (ref 19–32)
CREATININE: 14.14 mg/dL — AB (ref 0.50–1.35)
Calcium: 10.7 mg/dL — ABNORMAL HIGH (ref 8.4–10.5)
Chloride: 87 mEq/L — ABNORMAL LOW (ref 96–112)
GFR calc Af Amer: 5 mL/min — ABNORMAL LOW (ref >90)
GFR calc non Af Amer: 4 mL/min — ABNORMAL LOW (ref >90)
Glucose, Bld: 116 mg/dL — ABNORMAL HIGH (ref 70–99)
Phosphorus: 7.1 mg/dL — ABNORMAL HIGH (ref 2.3–4.6)
Potassium: 3.8 mEq/L (ref 3.7–5.3)
Sodium: 132 mEq/L — ABNORMAL LOW (ref 137–147)

## 2014-03-28 LAB — CBC
HCT: 31.5 % — ABNORMAL LOW (ref 39.0–52.0)
Hemoglobin: 11 g/dL — ABNORMAL LOW (ref 13.0–17.0)
MCH: 31.3 pg (ref 26.0–34.0)
MCHC: 34.9 g/dL (ref 30.0–36.0)
MCV: 89.7 fL (ref 78.0–100.0)
Platelets: 158 10*3/uL (ref 150–400)
RBC: 3.51 MIL/uL — AB (ref 4.22–5.81)
RDW: 13.6 % (ref 11.5–15.5)
WBC: 9.9 10*3/uL (ref 4.0–10.5)

## 2014-03-28 LAB — HEPATITIS B SURFACE ANTIGEN: Hepatitis B Surface Ag: NEGATIVE

## 2014-03-28 MED ORDER — CEFAZOLIN SODIUM-DEXTROSE 2-3 GM-% IV SOLR
2.0000 g | Freq: Once | INTRAVENOUS | Status: AC
  Administered 2014-03-28: 2 g via INTRAVENOUS
  Filled 2014-03-28 (×2): qty 50

## 2014-03-28 MED ORDER — OXYCODONE HCL 5 MG PO TABS
ORAL_TABLET | ORAL | Status: AC
  Filled 2014-03-28: qty 2

## 2014-03-28 MED ORDER — HYDROMORPHONE HCL PF 1 MG/ML IJ SOLN
INTRAMUSCULAR | Status: AC
  Filled 2014-03-28: qty 1

## 2014-03-28 NOTE — Progress Notes (Signed)
Subjective: Pt having pain - for hd today   Objective: Vital signs in last 24 hours: Temp:  [97.6 F (36.4 C)-98.1 F (36.7 C)] 98.1 F (36.7 C) (08/26 0600) Pulse Rate:  [83-101] 93 (08/26 0600) Resp:  [15-33] 18 (08/26 0600) BP: (105-145)/(39-75) 129/65 mmHg (08/26 0600) SpO2:  [93 %-100 %] 100 % (08/26 0600)  Intake/Output from previous day: 08/25 0701 - 08/26 0700 In: 1370 [P.O.:120; I.V.:1000; IV Piggyback:250] Out: 150 [Blood:150] Intake/Output this shift:    Exam:  Intact pulses distally Dorsiflexion/Plantar flexion intact  Labs:  Recent Labs  03/27/14 0606 03/27/14 1920  HGB 13.3 11.3*    Recent Labs  03/27/14 0606 03/27/14 1920  WBC  --  12.3*  RBC  --  3.66*  HCT 39.0 32.9*  PLT  --  170    Recent Labs  03/27/14 0606  NA 135*  K 3.3*  GLUCOSE 104*    Recent Labs  03/27/14 0615  INR 1.07    Assessment/Plan: Plan for PT and CPM today - dc am - abx with dialysis   [name] 03/28/2014, 8:13 AM

## 2014-03-28 NOTE — Op Note (Signed)
NAMEWHITTEN, ARNEY                ACCOUNT NO.:  [id number]  MEDICAL RECORD NO.:  [id number]  LOCATION:                                 FACILITY:  PHYSICIAN:  Anderson Malta, M.D.    DATE OF BIRTH:  01-14-1986  DATE OF PROCEDURE:  03/27/2014 DATE OF DISCHARGE:  03/27/2014                              OPERATIVE REPORT   PREOPERATIVE DIAGNOSIS:  Right knee lateral compartment arthritis.  POSTOPERATIVE DIAGNOSIS:  Right knee lateral compartment arthritis.  PROCEDURE:  Right knee lateral compartment unicompartmental arthroplasty with Biomet medium femur, size C tibia, 5 mm insert.  SURGEON:  Anderson Malta, M.D.  ASSISTANT:  Epimenio Foot, [name]  ANESTHESIA:  General.  INDICATION:  [name] is a 28 year old patient with severe unicompartmental arthritis in his right knee, presents now for operative management after failure of conservative management and explanation of risks and benefits.  PROCEDURE IN DETAIL:  The patient was brought to the operating room, where general endotracheal anesthesia was used.  Preoperative antibiotics were administered.  Time-out was called.  Right leg was prescrubbed with alcohol and Betadine, allowed to air dry, prepped with DuraPrep solution and draped in sterile manner.  Time-out was called. Ioban used to cover the operative field.  Tourniquet time was 119 minutes.  Leg was elevated, the extremity was wrapped with Esmarch wrap, tourniquet inflated.  An anterior approach to knee was made.  Lateral to the patellar tendon, an arthrotomy was made.  Fat pad was used for retraction.  Patellofemoral compartment was completely intact.  Medial compartment was completely intact.  Soft tissue dissection was performed minimally to expose the anterior tibial plateau.  This was taken down at [facility] tubercle.  At this time, there were no osteophytes in the intercondylar notch.  There were some osteophytes laterally which were removed.  At this time, a  tibia cut was made.  This was performed using the extramedullary alignment.  The tibial guide was parallel to the tibial crest.  About a 5 mm slope was utilized in accordance with preoperative templating and in conjunction with a 7 mm slope to tunnel on the tibial guide.  Initial cut was performed.  This barely got through the most deep portion of the defect.  This was re-cut with 2 mm, so that the final cut was approximately 2 mm below the defect. Initially, the saw was made and cut just beginning on the up slope of the tibial spine with slight internal rotation.  This oscillating saw cut vertically was first performed and then the completion of the cut performed perpendicular to the mechanical axis of the tibia in accordance with extramedullary alignment.  The tibial cut was removed and sized.  At this time, combination of intramedullary and extramedullary alignment was utilized.  Approximately 1 cm anterior to the medial wall of the lateral condyle, entry point was made.  The posterior cut was then made which was essentially parallel to the tibial cut in flexion.  Milling was then performed with a 0 and a 5 mm with a tibial tray in place, a 5 mm spacer in extension gave a full extension without recurvatum and about 2 finger tightness  and 10 degrees of flexion.  The __________ was then placed.  Initially with the tibial trial in place and the femur on cut, about a 1 mm __________ could be placed into the extension gap,  and thus the initial cut with a __________ was 5, -1 which was 4 mm.  This was revisited for 2 additional millimeters in order to achieve full extension with 5 mm __________.  Following this, the posterior cut on the femur was completed.  A trial was placed with both the femur and the tibia in position.  The patient had improved, but not neutral alignment.  He was basically corrected as much as he would correct preoperatively in 10-15 degrees of flexion.  At this time,  the tibia was prepared using the toothbrush saw.  No other hang was confirmed posteriorly, laterally, and anteriorly.  The trial was then placed with the tibial base plate and a 5 mm insert on the size C femur.  This again gave excellent range of motion.  No impingement, particularly in full extension.  The screw mechanism demonstrated maintenance of alignment of the prosthesis on the femur with the tibial implant.  The implants were removed.  At this time, thorough irrigation was performed.  Tourniquet was released. Bleeding points were controlled with electrocautery.  Components were then cemented into position with same stability parameters maintained. Exparel placed into the capsule.  Incision again irrigated.  Excess cement removed.  Incision then closed over a bolster using interrupted inverted #1 Vicryl suture, 0 Vicryl suture, 2-0 Vicryl suture and skin staples.  Solution of Marcaine, morphine, clonidine injected into the knee.  Marcaine itself injected into the skin edges.  Bulky dressing and knee immobilizer placed.  The patient tolerated the procedure well without immediate complication.  Freda Munro Vernon's assistance was required at all times during the case for retraction __________ neurovascular structures.  Her assistance was a medical necessity.     Anderson Malta, M.D.     GSD/MEDQ  D:  03/27/2014  T:  03/27/2014  Job:  [id number]

## 2014-03-28 NOTE — Progress Notes (Signed)
Physical Therapy Treatment Patient Details Name: [name] MRN: [id number] DOB: Dec 20, 1985 Today's Date: 03/28/2014    History of Present Illness [name] is a 28 y.o. male admitted 03/27/14 for R unicompartment TKA. Pt with h/o morbid obesity and R knee arthritis.    PT Comments    Pt continues to be greatly limited with therapy due to pain. Discussed with pt the need to progress mobility and independence with functional mobility prior to safe D/C home, pt verbalized understanding. Will cont to follow pt per POC and possibly assess the need for D/C disposition pending progress.   Follow Up Recommendations  Home health PT;Supervision/Assistance - 24 hour     Equipment Recommendations  Rolling walker with 5" wheels;3in1 (PT);Wheelchair (measurements PT)    Recommendations for Other Services       Precautions / Restrictions Precautions Precautions: Knee;Fall Required Braces or Orthoses: Knee Immobilizer - Right Knee Immobilizer - Right: On when out of bed or walking Restrictions Weight Bearing Restrictions: Yes RLE Weight Bearing: Weight bearing as tolerated    Mobility  Bed Mobility Overal bed mobility: Needs Assistance Bed Mobility: Supine to Sit     Supine to sit: Min assist;HOB elevated     General bed mobility comments: pt requried incr time due to pain; (A) to advance Rt LE to/off EOB and cues for hand placement and sequencing; attempted to educate pt on using sheet for UE support but pt was unable to due to pain   Transfers Overall transfer level: Needs assistance Equipment used: Rolling walker (2 wheeled) Transfers: Sit to/from Stand Sit to Stand: Mod assist;+2 safety/equipment         General transfer comment: cues for rocking technique to shift weight anteriorly; 2 person (A) for safety; pt greatly limited due to pain in Rt LE; max cues for hand placement and safety with RW   Ambulation/Gait Ambulation/Gait assistance: +2 safety/equipment;Min  assist Ambulation Distance (Feet): 10 Feet Assistive device: Rolling walker (2 wheeled) Gait Pattern/deviations: Step-to pattern;Decreased stance time - right;Decreased step length - left;Wide base of support;Trunk flexed Gait velocity: very decr due to pain Gait velocity interpretation: <1.8 ft/sec, indicative of risk for recurrent falls General Gait Details: pt demo difficulty stepping with Rt LE and with WBing on Rt LE; max cues for upright posture and gt sequencing; 2 person (A) for safety; pt taking multiple breaks and resting on RW; cues for safety with RW    Stairs            Wheelchair Mobility    Modified Rankin (Stroke Patients Only)       Balance Overall balance assessment: Needs assistance Sitting-balance support: Feet supported;No upper extremity supported Sitting balance-Leahy Scale: Fair Sitting balance - Comments: sat EOB ~5 min; denied dizziness; required (A) to support Rt LE due to pain    Standing balance support: During functional activity;Bilateral upper extremity supported Standing balance-Leahy Scale: Poor Standing balance comment: pt required (A) and relied heavily on RW for UE support                    Cognition Arousal/Alertness: Awake/alert Behavior During Therapy: WFL for tasks assessed/performed Overall Cognitive Status: Within Functional Limits for tasks assessed                      Exercises Total Joint Exercises Ankle Circles/Pumps: AAROM;Right;Left;10 reps;Other (comment) (limited in Rt LE due to pain ) Quad Sets: AROM;10 reps;Both;Strengthening;Seated Hip ABduction/ADduction: AAROM;Strengthening;Right;10 reps;Other (comment) (limited due to pain)  Goniometric ROM: unable to assess due to guarded secondary to pain    General Comments General comments (skin integrity, edema, etc.): dicussed rehab goals and D/C disposition       Pertinent Vitals/Pain Pain Assessment: 0-10 Pain Score: 10-Worst pain ever Pain Location:  Rt knee  Pain Descriptors / Indicators: Throbbing Pain Intervention(s): Limited activity within patient's tolerance;Monitored during session;Repositioned;Patient requesting pain meds-RN notified;Ice applied    Home Living                      Prior Function            PT Goals (current goals can now be found in the care plan section) Acute Rehab PT Goals Patient Stated Goal: to not hurt so bad  PT Goal Formulation: With patient Time For Goal Achievement: 04/03/14 Potential to Achieve Goals: Good Progress towards PT goals: Progressing toward goals    Frequency  7X/week    PT Plan Current plan remains appropriate    Co-evaluation             End of Session Equipment Utilized During Treatment: Gait belt;Right knee immobilizer Activity Tolerance: Patient limited by pain Patient left: in chair;with call bell/phone within reach;with nursing/sitter in room     Time: 1540-1605 PT Time Calculation (min): 25 min  Charges:  $Gait Training: 8-22 mins $Therapeutic Exercise: 8-22 mins                    G Codes:  Functional Assessment Tool Used: clinical judgment Functional Limitation: Mobility: Walking and moving around Mobility: Walking and Moving Around Current Status [id number]): At least 60 percent but less than 80 percent impaired, limited or restricted Mobility: Walking and Moving Around Goal Status [phone]): At least 1 percent but less than 20 percent impaired, limited or restricted   [name], [place] 03/28/2014, 5:03 PM

## 2014-03-28 NOTE — Progress Notes (Signed)
Physical Therapy Treatment Patient Details Name: [name] MRN: [id number] DOB: June 13, 1986 Today's Date: 03/28/2014    History of Present Illness [name] is a 28 y.o. male admitted 03/27/14 for R unicompartment TKA. Pt with h/o morbid obesity and R knee arthritis.    PT Comments    Patient limited by pain and fatigue this session however was able to ambulate about 51ft. Patient is planning to DC home tomorrow. He has dialysis this afternoon so unsure if he will be able to get second round of therapy but will attempt. Recommend WC for home use as patient will have to get back and forth to dialysis starting Friday. Patient does have ramp to enter his house  Follow Up Recommendations  Home health PT;Supervision/Assistance - 24 hour     Equipment Recommendations  Rolling walker with 5" wheels;3in1 (PT);Wheelchair (measurements PT)    Recommendations for Other Services       Precautions / Restrictions Precautions Precautions: Knee Required Braces or Orthoses: Knee Immobilizer - Right Knee Immobilizer - Right: On when out of bed or walking Restrictions Weight Bearing Restrictions: Yes RLE Weight Bearing: Weight bearing as tolerated    Mobility  Bed Mobility Overal bed mobility: Needs Assistance Bed Mobility: Sit to Supine     Supine to sit: Min assist Sit to supine: Min assist   General bed mobility comments: (A) for RLE management out of bed. Patient able to use PTAs hand to pull trunk into sitting  Transfers Overall transfer level: Needs assistance Equipment used: Rolling walker (2 wheeled) Transfers: Sit to/from Stand Sit to Stand: Mod assist;+2 safety/equipment Stand pivot transfers: Min assist;+2 safety/equipment       General transfer comment: A to power up into standing and cues for correct technique. Also cues to sit slowly  Ambulation/Gait Ambulation/Gait assistance: Min assist;+2 safety/equipment Ambulation Distance (Feet): 10 Feet Assistive device:  Rolling walker (2 wheeled) Gait Pattern/deviations: Step-to pattern;Decreased step length - right;Decreased step length - left   Gait velocity interpretation: Below normal speed for age/gender General Gait Details: Cues for RW placement and gait sequence. Patient requiring increased time to advance R LE. Patient fatiguing quickly and return to sitting   Stairs            Wheelchair Mobility    Modified Rankin (Stroke Patients Only)       Balance                                    Cognition Arousal/Alertness: Awake/alert Behavior During Therapy: WFL for tasks assessed/performed Overall Cognitive Status: Within Functional Limits for tasks assessed                      Exercises Total Joint Exercises Quad Sets: AROM;Right;10 reps;Supine    General Comments        Pertinent Vitals/Pain Pain Assessment: 0-10 Pain Score: 9  Pain Location: R knee Pain Descriptors / Indicators: Aching;Burning;Constant Pain Intervention(s): Patient requesting pain meds-RN notified;RN gave pain meds during session;Monitored during session    [place] expects to be discharged to:: Private residence Living Arrangements: Parent Available Help at Discharge: Family;Available 24 hours/day Type of Home: House Home Access: Ramped entrance   Home Layout: One level Home Equipment: None      Prior Function Level of Independence: Independent          PT Goals (current goals can now be found in the  care plan section) Acute Rehab PT Goals Patient Stated Goal: home Progress towards PT goals: Progressing toward goals    Frequency  7X/week    PT Plan Current plan remains appropriate;Equipment recommendations need to be updated    Co-evaluation             End of Session Equipment Utilized During Treatment: Gait belt;Right knee immobilizer Activity Tolerance: Patient limited by pain Patient left: in chair;with call bell/phone within reach      Time: 1010-1036 PT Time Calculation (min): 26 min  Charges:  $Gait Training: 8-22 mins $Therapeutic Exercise: 8-22 mins                    G Codes:      [name] 03/28/2014, 12:11 PM 03/28/2014 [name] PTA [phone] pager [phone] office

## 2014-03-28 NOTE — Care Management Note (Signed)
CARE MANAGEMENT NOTE 03/28/2014  Patient:  [name], [name]   Account Number:  [id number]  Date Initiated:  03/28/2014  Documentation initiated by:  Ricki Miller  Subjective/Objective Assessment:   28 yr old male s/p right unicompartmental arthroplasty. Patient is ESRD, on HD Mon.Wed.Fri.     Action/Plan:   Case manager spoke with patient concerning home health and DME needs at discharge. Preoperatively setup through Dr.'s office with [facility], no changes.   Anticipated DC Date:  03/29/2014   Anticipated DC Plan:  [place]  CM consult      [facility] Choice  HOME HEALTH  DURABLE MEDICAL EQUIPMENT   Choice offered to / List presented to:  C-1 Patient   DME arranged  [place]  3-N-1      DME agency  [employer]     [place] arranged  HH-2 PT      [place] agency  [facility]   Status of service:  Completed, signed off Medicare Important Message given?  NA - LOS <3 / Initial given by admissions (If response is "NO", the following Medicare IM given date fields will be blank) Date Medicare IM given:   Medicare IM given by:   Date Additional Medicare IM given:   Additional Medicare IM given by:    Discharge Disposition:  [place]  Per UR Regulation:  Reviewed for med. necessity/level of care/duration of stay

## 2014-03-28 NOTE — Progress Notes (Signed)
Occupational Therapy Evaluation Patient Details Name: [name] MRN: [id number] DOB: 06-Jun-1986 Today's Date: 03/28/2014    History of Present Illness [name] is a 28 y.o. male admitted 03/27/14 for R unicompartment TKA. Pt with h/o morbid obesity and R knee arthritis.   Clinical Impression   PTA pt reports that he was independent with ADLs and functional mobility. Pt currently requires mod (A) for sit<>stand due to pain and limited mobility. Pt requires max (A) for LB ADLs due to body habitus and pain. He reports that family will be home to assist and he plans to sponge bathe until mobility is improved. Educated pt on fall prevention and energy conservation as well as importance of functional mobility at home to increase strength. Pt would benefit from continued skilled OT to increase independence with toilet transfer and LB ADLs. Pt would benefit from [facility] following d/c home.    Follow Up Recommendations  Home health OT;Supervision/Assistance - 24 hour    Equipment Recommendations  Other (comment) (pt has received RW and 3N1 (bariatric))    Recommendations for Other Services       Precautions / Restrictions Precautions Precautions: Knee Required Braces or Orthoses: Knee Immobilizer - Right Knee Immobilizer - Right: On when out of bed or walking Restrictions Weight Bearing Restrictions: Yes RLE Weight Bearing: Weight bearing as tolerated      Mobility Bed Mobility Overal bed mobility: Needs Assistance Bed Mobility: Sit to Supine       Sit to supine: Min assist   General bed mobility comments: (A) for RLE management onto bed. Pt able to use Bil UEs to scoot himself back onto the bed before pivoting to lay down.   Transfers Overall transfer level: Needs assistance Equipment used: Rolling walker (2 wheeled) Transfers: Sit to/from [facility] Sit to Stand: Mod assist;+2 safety/equipment Stand pivot transfers: Min assist;+2 safety/equipment        General transfer comment: Pt continues to require assist for RLE to step forward and requests OT to hold RLE up prior to sitting. Pt Bil UEs shaking with effort and demonstrates generalized weakness.          ADL Overall ADL's : Needs assistance/impaired Eating/Feeding: Independent;Sitting   Grooming: Set up;Sitting   Upper Body Bathing: Set up;Sitting   Lower Body Bathing: Maximal assistance;Sit to/from stand   Upper Body Dressing : Set up;Sitting   Lower Body Dressing: Maximal assistance;+2 for safety/equipment;Sit to/from stand   Toilet Transfer: Moderate assistance;Stand-pivot;RW       Tub/ Shower Transfer: Total assistance Tub/Shower Transfer Details (indicate cue type and reason): pt plans to perform sponge bathes until he is able to ambulate more easily. Discussed fall prevention strategies.   General ADL Comments: Pt highly limited by pain this date and unable to ambulate. Pt demonstrated difficulty with stand-pivot transfer from recliner to bed with increased anxiety and difficulty with stand>sit and required assist to hold RLE up per request. Educated pt on fall prevention and energy conservation as well as relaxation techniques for pain management. Pt reports his family will be home to assist with socks and shoes.      Vision  Pt has no apparent visual deficits.                    Perception Perception Perception Tested?: No   Praxis Praxis Praxis tested?: Within functional limits    Pertinent Vitals/Pain Pain Assessment: 0-10 Pain Score: 8  Pain Location: R knee Pain Descriptors / Indicators: Aching;Burning;Constant Pain Intervention(s):  Limited activity within patient's tolerance;Monitored during session;Premedicated before session;Repositioned;Relaxation     Hand Dominance Right   Extremity/Trunk Assessment Upper Extremity Assessment Upper Extremity Assessment: Generalized weakness   Lower Extremity Assessment Lower Extremity Assessment:  Defer to PT evaluation   Cervical / Trunk Assessment Cervical / Trunk Assessment: Normal   Communication Communication Communication: No difficulties   Cognition Arousal/Alertness: Awake/alert Behavior During Therapy: WFL for tasks assessed/performed Overall Cognitive Status: Within Functional Limits for tasks assessed                                Home Living Family/patient expects to be discharged to:: Private residence Living Arrangements: Parent Available Help at Discharge: Family;Available 24 hours/day Type of Home: House Home Access: Ramped entrance     Home Layout: One level     Bathroom Shower/Tub: Tub/shower unit Shower/tub characteristics: Architectural technologist: Standard     Home Equipment: None          Prior Functioning/Environment Level of Independence: Independent             OT Diagnosis: Generalized weakness;Acute pain   OT Problem List: Decreased strength;Decreased range of motion;Decreased activity tolerance;Impaired balance (sitting and/or standing);Decreased safety awareness;Decreased knowledge of use of DME or AE;Decreased knowledge of precautions;Pain   OT Treatment/Interventions: Self-care/ADL training;Therapeutic exercise;Energy conservation;DME and/or AE instruction;Splinting;Therapeutic activities;Patient/family education;Balance training    OT Goals(Current goals can be found in the care plan section) Acute Rehab OT Goals Patient Stated Goal: home OT Goal Formulation: With patient Time For Goal Achievement: 04/04/14 Potential to Achieve Goals: Good  OT Frequency: Min 2X/week    End of Session Equipment Utilized During Treatment: Rolling walker;Gait belt;Right knee immobilizer CPM Right Knee CPM Right Knee: Off  Activity Tolerance: Patient limited by pain Patient left: in bed;with call bell/phone within reach   Time: [id number] OT Time Calculation (min): 23 min Charges:  OT General Charges $OT Visit: 1  Procedure OT Evaluation $Initial OT Evaluation Tier I: 1 Procedure OT Treatments $Self Care/Home Management : 8-22 mins G-Codes: OT G-codes **NOT FOR INPATIENT CLASS** Functional Assessment Tool Used: clinical judgement Functional Limitation: Self care Self Care Current Status [id number]): At least 60 percent but less than 80 percent impaired, limited or restricted Self Care Goal Status [id number]): At least 20 percent but less than 40 percent impaired, limited or restricted  Juluis Rainier [id number] 03/28/2014, 12:06 PM

## 2014-03-28 NOTE — Progress Notes (Signed)
UR completed 

## 2014-03-28 NOTE — Progress Notes (Signed)
Subjective:  Still with significant right knee pain, but ambulating with canes and assistance  Objective: Vital signs in last 24 hours: Temp:  [97.6 F (36.4 C)-98.1 F (36.7 C)] 97.6 F (36.4 C) (08/26 0800) Pulse Rate:  [83-101] 94 (08/26 0800) Resp:  [15-33] 19 (08/26 0800) BP: (105-145)/(39-77) 138/77 mmHg (08/26 0800) SpO2:  [93 %-100 %] 100 % (08/26 0800) Weight change:   Intake/Output from previous day: 08/25 0701 - 08/26 0700 In: 1370 [P.O.:120; I.V.:1000; IV Piggyback:250] Out: 150 [Blood:150] Intake/Output this shift: Total I/O In: 120 [P.O.:120] Out: -   Lab Results:  Recent Labs  03/27/14 0606 03/27/14 1920  WBC  --  12.3*  HGB 13.3 11.3*  HCT 39.0 32.9*  PLT  --  170   BMET:  Recent Labs  03/27/14 0606  NA 135*  K 3.3*  GLUCOSE 104*   No results found for this basename: PTH,  in the last 72 hours Iron Studies: No results found for this basename: IRON, TIBC, TRANSFERRIN, FERRITIN,  in the last 72 hours  Studies/Results: No results found.  EXAM: General appearance:  Alert, in no apparent distress Resp:  CTA without rales, rhonchi, or wheezes Cardio:  RRR without murmur or rub Lungs are clear GI:  Obese, + BS, soft and nontender Extremities:  No edema Access:  AVF @ RFA with + bruit  Dialysis Orders: MWF @ [facility]  4:30 125 kg 400/800 2K/3.5Ca AVF @ RFA Heparin 12,000 U  Calcitriol 0.75 mcg No Aranesp or Venofer  Assessment/Plan: 1. Severe R knee arthritis - s/p unicompartmental arthroplasty by Dr. Marlou Sa yesterday, ambulating with assistance, needs to transfer to dialysis chair. 2. ESRD - HD on MWF @ [place], K 3.3.  HD pending today. 3. Hypertension/volume - BP 138/77, no meds; 4.7 over EDW, but rarely reaches UF goal (post-HD wt 126.2 on 8/24).  4. Anemia - Hgb 13.3, no Aranesp or Fe. 5. 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 6. Nutrition - Last Alb  4, renal diet, multivitamin. 7. Vascular access - R cimino AVF with diminished thrill but + bruit.     LOS: 1 day   [name],[name] 03/28/2014,10:57 AM  Pt seen, examined, agree w assess/plan as above with additions as indicated.  Kelly Splinter MD pager [phone]    cell [phone] 03/28/2014, 1:04 PM

## 2014-03-28 NOTE — Progress Notes (Signed)
Report called for HD. Transport via bed - patient in stable condition.

## 2014-03-29 DIAGNOSIS — M1710 Unilateral primary osteoarthritis, unspecified knee: Secondary | ICD-10-CM | POA: Diagnosis not present

## 2014-03-29 MED ORDER — OXYCODONE HCL 5 MG PO TABS: 5.0000 mg | ORAL_TABLET | ORAL | Status: DC | PRN

## 2014-03-29 MED ORDER — DSS 100 MG PO CAPS: 100.0000 mg | ORAL_CAPSULE | Freq: Two times a day (BID) | ORAL | Status: DC

## 2014-03-29 MED ORDER — CALCIUM CARBONATE ANTACID 500 MG PO CHEW: 4.0000 | CHEWABLE_TABLET | Freq: Two times a day (BID) | ORAL | Status: DC

## 2014-03-29 MED ORDER — METHOCARBAMOL 500 MG PO TABS: 500.0000 mg | ORAL_TABLET | Freq: Four times a day (QID) | ORAL | Status: DC | PRN

## 2014-03-29 MED ORDER — ASPIRIN EC 325 MG PO TBEC: 325.0000 mg | DELAYED_RELEASE_TABLET | Freq: Every day | ORAL | Status: DC

## 2014-03-29 NOTE — Progress Notes (Signed)
Subjective: Pt stable - slow to progress   Objective: Vital signs in last 24 hours: Temp:  [97.2 F (36.2 C)-98.7 F (37.1 C)] 98.7 F (37.1 C) (08/27 0600) Pulse Rate:  [81-95] 95 (08/27 0600) Resp:  [12-22] 14 (08/27 0600) BP: (80-159)/(39-85) 139/79 mmHg (08/27 0600) SpO2:  [96 %-100 %] 100 % (08/27 0600) Weight:  [131.4 kg (289 lb 11 oz)] 131.4 kg (289 lb 11 oz) (08/26 1811)  Intake/Output from previous day: 08/26 0701 - 08/27 0700 In: 290 [P.O.:290] Out: 3444  Intake/Output this shift:    Exam:  Sensation intact distally Intact pulses distally Dorsiflexion/Plantar flexion intact  Labs:  Recent Labs  03/27/14 0606 03/27/14 1920 03/28/14 1823  HGB 13.3 11.3* 11.0*    Recent Labs  03/27/14 1920 03/28/14 1823  WBC 12.3* 9.9  RBC 3.66* 3.51*  HCT 32.9* 31.5*  PLT 170 158    Recent Labs  03/27/14 0606 03/28/14 1823  NA 135* 132*  K 3.3* 3.8  CL  --  87*  CO2  --  20  BUN  --  66*  CREATININE  --  14.14*  GLUCOSE 104* 116*  CALCIUM  --  10.7*    Recent Labs  03/27/14 0615  INR 1.07    Assessment/Plan: Plan PT today x 2 and dc to home later this afternoon   [name] 03/29/2014, 7:57 AM

## 2014-03-29 NOTE — Progress Notes (Signed)
Physical Therapy Treatment Patient Details Name: [name] MRN: [id number] DOB: [birth year] Today's Date: 03/29/2014    History of Present Illness [name] is a 28 y.o. male admitted 03/27/14 for R unicompartment TKA. Pt with h/o morbid obesity and R knee arthritis.    PT Comments    Patient able to walk more efficiently this afternoon with more ease than this morning. Patient transferring with less pain and more control. Ambulation distance still limited by pain and fatigue but patient will have WC for home use and to get to dialysis. Patient given HEP and no further questions at this time. HHPT once home.   Follow Up Recommendations  Home health PT;Supervision/Assistance - 24 hour     Equipment Recommendations  Rolling walker with 5" wheels;3in1 (PT);Wheelchair (measurements PT)    Recommendations for Other Services       Precautions / Restrictions Precautions Precautions: Knee;Fall Required Braces or Orthoses: Knee Immobilizer - Right Knee Immobilizer - Right: On when out of bed or walking Restrictions RLE Weight Bearing: Weight bearing as tolerated    Mobility  Bed Mobility Overal bed mobility: Needs Assistance Bed Mobility: Supine to Sit       Sit to supine: Min assist   General bed mobility comments: Min A to lift R LE up into bed.   Transfers Overall transfer level: Needs assistance Equipment used: Rolling walker (2 wheeled)   Sit to Stand: Min guard         General transfer comment: Patient able to stand with [place] for safety. Patient with correct technique. Able to stand well with use of armrest.   Ambulation/Gait Ambulation/Gait assistance: Min guard Ambulation Distance (Feet): 20 Feet Assistive device: Rolling walker (2 wheeled) Gait Pattern/deviations: Step-to pattern;Decreased step length - left;Decreased stance time - right   Gait velocity interpretation: Below normal speed for age/gender General Gait Details: With increased time  patient was able to walk around room to other side of bed with cues for increasing step length and standing up right.    Stairs            Wheelchair Mobility    Modified Rankin (Stroke Patients Only)       Balance                                    Cognition Arousal/Alertness: Awake/alert Behavior During Therapy: WFL for tasks assessed/performed Overall Cognitive Status: Within Functional Limits for tasks assessed                      Exercises Total Joint Exercises Quad Sets: AROM;10 reps;Both;Seated Heel Slides: AAROM;Right;Seated;10 reps Hip ABduction/ADduction: AAROM;Right;10 reps Straight Leg Raises: AAROM;Right;10 reps    General Comments        Pertinent Vitals/Pain Pain Score: 8  Pain Location: Rt knee Pain Descriptors / Indicators: Sore;Grimacing;Throbbing Pain Intervention(s): Limited activity within patient's tolerance;Patient requesting pain meds-RN notified;Monitored during session    Home Living                      Prior Function            PT Goals (current goals can now be found in the care plan section) Progress towards PT goals: Progressing toward goals    Frequency  7X/week    PT Plan Current plan remains appropriate    Co-evaluation  End of Session Equipment Utilized During Treatment: Gait belt;Right knee immobilizer Activity Tolerance: Patient limited by pain Patient left: in bed;with call bell/phone within reach     Time: 1317-1343 PT Time Calculation (min): 26 min  Charges:  $Gait Training: 8-22 mins $Therapeutic Exercise: 8-22 mins                    G Codes:      [name] 03/29/2014, 1:58 PM  03/29/2014 [name] PTA [phone] pager [phone] office

## 2014-03-29 NOTE — Progress Notes (Addendum)
Subjective:  Feeling better, pain controlled, anticipating discharge today  Objective: Vital signs in last 24 hours: Temp:  [97.2 F (36.2 C)-98.7 F (37.1 C)] 98.7 F (37.1 C) (08/27 0600) Pulse Rate:  [81-95] 95 (08/27 0600) Resp:  [12-22] 14 (08/27 0600) BP: (80-159)/(39-85) 139/79 mmHg (08/27 0600) SpO2:  [96 %-100 %] 100 % (08/27 0600) Weight:  [131.4 kg (289 lb 11 oz)] 131.4 kg (289 lb 11 oz) (08/26 1811) Weight change:   Intake/Output from previous day: 08/26 0701 - 08/27 0700 In: 290 [P.O.:290] Out: 3444  Intake/Output this shift: Total I/O In: 120 [P.O.:120] Out: -   Lab Results:  Recent Labs  03/27/14 1920 03/28/14 1823  WBC 12.3* 9.9  HGB 11.3* 11.0*  HCT 32.9* 31.5*  PLT 170 158   BMET:  Recent Labs  03/27/14 0606 03/28/14 1823  NA 135* 132*  K 3.3* 3.8  CL  --  87*  CO2  --  20  GLUCOSE 104* 116*  BUN  --  66*  CREATININE  --  14.14*  CALCIUM  --  10.7*  ALBUMIN  --  3.5   No results found for this basename: PTH,  in the last 72 hours Iron Studies: No results found for this basename: IRON, TIBC, TRANSFERRIN, FERRITIN,  in the last 72 hours  Studies/Results: No results found.  EXAM:  General appearance: Alert, in no apparent distress  Resp: CTA without rales, rhonchi, or wheezes  Cardio: RRR without murmur or rub   GI: Obese, + BS, soft and nontender  Extremities: No edema, knee with immobilizer  Access: AVF @ RFA with + bruit   Dialysis Orders: MWF @ [facility]  4:30 125 kg 400/800 2K/3.5Ca AVF @ RFA Heparin 12,000 U  Calcitriol 0.75 mcg No Aranesp or Venofer  Assessment/Plan: 1. Severe R knee arthritis - s/p unicompartmental arthroplasty by Dr. Marlou Sa 8/25, ambulating with PT, needs to transfer to dialysis chair. 2. ESRD - HD on MWF @ [facility], K 3.8.  Next HD tomorrow. 3. Hypertension/volume - BP 139/79, no meds; wt (including immobilizer) 131.4 kg s/p UF goal 3.4 L yesterday, does not reach goal at outpatient center. 4. Anemia - Hgb 11, no  Aranesp or Fe. 5. Metabolic bone disease - s/p parathyroidectomy 03/30/13, Ca 10.7 (11.1 corrected), P 7.1; 3.5Ca bath, Calcitriol 0.75 mcg, Phoslo 5 with meals, Tums qid.  Lower bath to [employer].  The serum calcium level jumped up in the hospital, it has always run low. Had PTX one year ago in August. I questioned the patient about his medication use at home - he is taking all his phoslo 4 ac, he is taking the Tums between meals but only twice a day, not 3x a day, and he generally speaking is not taking the Rocaltrol, taking it " as needed" or when he has it , he cannot afford it.  Will remove Rocaltrol from his med list and reduce the Tums to bid between meals to better reflect what he is actually able to take at home.  6. Nutrition - Alb 3.5, renal diet, multivitamin. 7. Vascular access - R cimino AVF with diminished thrill but + bruit.    LOS: 2 days   LYLES,CHARLES 03/29/2014,10:27 AM  Pt seen, examined, agree w assess/plan as above with additions as indicated.  Kelly Splinter MD pager [phone]    cell [phone] 03/29/2014, 3:16 PM

## 2014-03-29 NOTE — Progress Notes (Signed)
OT Cancellation Note  Patient Details Name: [name] MRN: [id number] DOB: 05-01-1986   Cancelled Treatment:    Reason Eval/Treat Not Completed: Patient declined, no reason specified. Pt declined therapy at this time stating "I just can't see myself getting out of bed right now." Educated pt on purpose of therapy and mobility for strengthening and pt again politely declined. Per PTA note, pt ambulated 20 ft Min guard. Encouraged pt to routinely get up to stand/walk at home. D/C plan is home this afternoon.   Juluis Rainier [phone] 03/29/2014, 3:04 PM

## 2014-03-29 NOTE — Progress Notes (Signed)
Physical Therapy Treatment Patient Details Name: [name] MRN: [id number] DOB: 1985/12/27 Today's Date: 03/29/2014    History of Present Illness [name] is a 28 y.o. male admitted 03/27/14 for R unicompartment TKA. Pt with h/o morbid obesity and R knee arthritis.    PT Comments    Patient continues to be limited by pain but is tolerating therapy much better today. The plan is for home this afternoon. Will see again to review HEP and work on ambulation. Patient will have a WC for home use and to get to and from dialysis. Patient has ramp to enter his house  Follow Up Recommendations  Home health PT;Supervision/Assistance - 24 hour     Equipment Recommendations  Rolling walker with 5" wheels;3in1 (PT);Wheelchair (measurements PT)    Recommendations for Other Services       Precautions / Restrictions Precautions Precautions: Knee;Fall Required Braces or Orthoses: Knee Immobilizer - Right Knee Immobilizer - Right: On when out of bed or walking Restrictions RLE Weight Bearing: Weight bearing as tolerated    Mobility  Bed Mobility Overal bed mobility: Needs Assistance Bed Mobility: Supine to Sit       Sit to supine: Min assist   General bed mobility comments: Min A for R LE coming out of bed but patient able to get into upright positoning well. Cues for technique  Transfers Overall transfer level: Needs assistance Equipment used: Rolling walker (2 wheeled)   Sit to Stand: Min assist         General transfer comment: Patient able to stand with Min A this session for safety and to ensure balance anteriorly upon standing. Cues for safe hand placement and LE positioning. Cues to come to sit slowly  Ambulation/Gait Ambulation/Gait assistance: Min assist;+2 safety/equipment Ambulation Distance (Feet): 16 Feet Assistive device: Rolling walker (2 wheeled) Gait Pattern/deviations: Step-to pattern;Decreased stance time - right;Decreased step length - left   Gait  velocity interpretation: Below normal speed for age/gender General Gait Details: pt demo difficulty stepping with Rt LE and with WBing on Rt LE; max cues for upright posture and gt sequencing; 2 person (A) for safety; pt taking multiple breaks and resting on RW; cues for safety with RW    Stairs            Wheelchair Mobility    Modified Rankin (Stroke Patients Only)       Balance                                    Cognition Arousal/Alertness: Awake/alert Behavior During Therapy: WFL for tasks assessed/performed Overall Cognitive Status: Within Functional Limits for tasks assessed                      Exercises Total Joint Exercises Quad Sets: AROM;10 reps;Both;Seated Heel Slides: AAROM;Right;Seated;10 reps Hip ABduction/ADduction: AAROM;Right;10 reps Straight Leg Raises: AAROM;Right;10 reps    General Comments        Pertinent Vitals/Pain Pain Score: 8  Pain Location: Rt knee Pain Descriptors / Indicators: Sore;Grimacing;Throbbing Pain Intervention(s): Limited activity within patient's tolerance;Patient requesting pain meds-RN notified;Monitored during session    Home Living                      Prior Function            PT Goals (current goals can now be found in the care plan section) Progress towards  PT goals: Progressing toward goals    Frequency  7X/week    PT Plan Current plan remains appropriate    Co-evaluation             End of Session Equipment Utilized During Treatment: Gait belt;Right knee immobilizer Activity Tolerance: Patient limited by pain Patient left: in chair;with call bell/phone within reach;with nursing/sitter in room     Time: 0913-0940 PT Time Calculation (min): 27 min  Charges:  $Gait Training: 8-22 mins $Therapeutic Exercise: 8-22 mins                    G Codes:      [name] 03/29/2014, 10:59 AM 03/29/2014 [name] PTA [phone]  pager [phone] office

## 2014-03-29 NOTE — Progress Notes (Signed)
Agree with PTA.    Kento Gossman, PT [phone]  

## 2014-04-09 ENCOUNTER — Encounter (HOSPITAL_COMMUNITY): Payer: Self-pay | Admitting: Emergency Medicine

## 2014-04-09 ENCOUNTER — Emergency Department (HOSPITAL_COMMUNITY): Payer: Medicare Other

## 2014-04-09 ENCOUNTER — Emergency Department (HOSPITAL_COMMUNITY)
Admission: EM | Admit: 2014-04-09 | Discharge: 2014-04-09 | Disposition: A | Discharge status: Home / Self Care | Payer: Medicare Other | Attending: Emergency Medicine | Admitting: Emergency Medicine

## 2014-04-09 DIAGNOSIS — R079 Chest pain, unspecified: Secondary | ICD-10-CM | POA: Diagnosis present

## 2014-04-09 DIAGNOSIS — Z7982 Long term (current) use of aspirin: Secondary | ICD-10-CM | POA: Insufficient documentation

## 2014-04-09 DIAGNOSIS — R51 Headache: Secondary | ICD-10-CM | POA: Diagnosis not present

## 2014-04-09 DIAGNOSIS — K219 Gastro-esophageal reflux disease without esophagitis: Secondary | ICD-10-CM | POA: Diagnosis not present

## 2014-04-09 DIAGNOSIS — R071 Chest pain on breathing: Secondary | ICD-10-CM | POA: Insufficient documentation

## 2014-04-09 DIAGNOSIS — Z87448 Personal history of other diseases of urinary system: Secondary | ICD-10-CM | POA: Insufficient documentation

## 2014-04-09 DIAGNOSIS — F17200 Nicotine dependence, unspecified, uncomplicated: Secondary | ICD-10-CM | POA: Diagnosis not present

## 2014-04-09 DIAGNOSIS — R0789 Other chest pain: Secondary | ICD-10-CM

## 2014-04-09 DIAGNOSIS — I10 Essential (primary) hypertension: Secondary | ICD-10-CM | POA: Insufficient documentation

## 2014-04-09 DIAGNOSIS — Z79899 Other long term (current) drug therapy: Secondary | ICD-10-CM | POA: Insufficient documentation

## 2014-04-09 LAB — BASIC METABOLIC PANEL
ANION GAP: 19 — AB (ref 5–15)
BUN: 30 mg/dL — AB (ref 6–23)
CALCIUM: 11.3 mg/dL — AB (ref 8.4–10.5)
CHLORIDE: 94 meq/L — AB (ref 96–112)
CO2: 24 mEq/L (ref 19–32)
CREATININE: 8.15 mg/dL — AB (ref 0.50–1.35)
GFR calc Af Amer: 9 mL/min — ABNORMAL LOW (ref >90)
GFR calc non Af Amer: 8 mL/min — ABNORMAL LOW (ref >90)
Glucose, Bld: 91 mg/dL (ref 70–99)
Potassium: 3.3 mEq/L — ABNORMAL LOW (ref 3.7–5.3)
Sodium: 137 mEq/L (ref 137–147)

## 2014-04-09 LAB — I-STAT TROPONIN, ED
TROPONIN I, POC: 0 ng/mL (ref 0.00–0.08)
Troponin i, poc: 0 ng/mL (ref 0.00–0.08)

## 2014-04-09 LAB — CBC
HEMATOCRIT: 33.7 % — AB (ref 39.0–52.0)
Hemoglobin: 11.7 g/dL — ABNORMAL LOW (ref 13.0–17.0)
MCH: 30.5 pg (ref 26.0–34.0)
MCHC: 34.7 g/dL (ref 30.0–36.0)
MCV: 88 fL (ref 78.0–100.0)
PLATELETS: 273 10*3/uL (ref 150–400)
RBC: 3.83 MIL/uL — AB (ref 4.22–5.81)
RDW: 13.2 % (ref 11.5–15.5)
WBC: 11.5 10*3/uL — ABNORMAL HIGH (ref 4.0–10.5)

## 2014-04-09 LAB — PRO B NATRIURETIC PEPTIDE: Pro B Natriuretic peptide (BNP): 739.9 pg/mL — ABNORMAL HIGH (ref 0–125)

## 2014-04-09 MED ORDER — ONDANSETRON HCL 4 MG/2ML IJ SOLN
4.0000 mg | Freq: Once | INTRAMUSCULAR | Status: AC
  Administered 2014-04-09: 4 mg via INTRAVENOUS
  Filled 2014-04-09: qty 2

## 2014-04-09 MED ORDER — OXYCODONE-ACETAMINOPHEN 5-325 MG PO TABS
2.0000 | ORAL_TABLET | Freq: Once | ORAL | Status: AC
  Administered 2014-04-09: 2 via ORAL
  Filled 2014-04-09: qty 2

## 2014-04-09 MED ORDER — HYDROMORPHONE HCL PF 1 MG/ML IJ SOLN
1.0000 mg | Freq: Once | INTRAMUSCULAR | Status: AC
  Administered 2014-04-09: 1 mg via INTRAVENOUS
  Filled 2014-04-09: qty 1

## 2014-04-09 NOTE — ED Provider Notes (Signed)
CSN: [id number]     Arrival date & time 04/09/14  1621 History   First MD Initiated Contact with Patient 04/09/14 1640     Chief Complaint  Patient presents with  . Chest Pain     (Consider location/radiation/quality/duration/timing/severity/associated sxs/prior Treatment) HPI Comments: Presents to the ER for chest pain. Patient had sudden onset of a sharp and constant pain over his sternum while at dialysis today. Patient was 2 hours into his dialysis treatment when the pain began. After 3 hours he started having increased pain, dialysis was stopped and he was sent to the ER. The patient is not short of breath. Patient reports that the area is extremely tender to touch, pain worsens when he moves.  Patient is a 28 y.o. male presenting with chest pain.  Chest Pain   Past Medical History  Diagnosis Date  . Hyperparathyroidism   . Morbid obesity   . Arthritis   . Complication of anesthesia     A little while to wake up after knee surgery in 2008  . Family history of anesthesia complication     mom slow to wake up  . Headache(784.0)     occasionally  . Dizziness     when coming off of dialysis  . Joint pain   . Joint swelling   . GERD (gastroesophageal reflux disease)     takes Omeprazole as needed  . Renal disorder     MWF and goes to [facility]  . Hypertension    Past Surgical History  Procedure Laterality Date  . Av fistula placement Bilateral     Uses Right arm  . Esophagogastroduodenoscopy endoscopy    . Knee arthroscopy Left 2007  . Parathyroidectomy N/A 03/30/2013    Procedure: TOTAL PARATHYROIDECTOMY WITH AUTOTRANSPLANT;  Surgeon: Earnstine Regal, MD;  Location: [facility];  Service: General;  Laterality: N/A;  Autotransplant to left lower arm.  . Total knee arthroplasty Right 03/27/2014    DR Marlou Sa  . Total knee arthroplasty Right 03/27/2014    Procedure: UNICOMPARTMENTAL ARTHROPLASTY;  Surgeon: Meredith Pel, MD;  Location: [facility];  Service: Orthopedics;  Laterality:  Right;   No family history on file. History  Substance Use Topics  . Smoking status: Current Some Day Smoker -- 0.20 packs/day for 13 years    Types: Cigarettes  . Smokeless tobacco: Never Used     Comment: 2 cigs a day  . Alcohol Use: No    Review of Systems  Cardiovascular: Positive for chest pain.  All other systems reviewed and are negative.     Allergies  Review of patient's allergies indicates no known allergies.  Home Medications   Prior to Admission medications   Medication Sig Start Date End Date Taking? Authorizing Provider  aspirin EC 325 MG tablet Take 325 mg by mouth daily. 03/29/14  Yes Meredith Pel, MD  calcium acetate (PHOSLO) 667 MG capsule Take 3,335 mg by mouth 3 (three) times daily with meals.    Yes Historical Provider, MD  calcium carbonate (TUMS - DOSED IN MG ELEMENTAL CALCIUM) 500 MG chewable tablet Chew 4 tablets by mouth 3 (three) times daily with meals.    Yes Historical Provider, MD  Docusate Sodium (DSS) 100 MG CAPS Take 100 mg by mouth 2 (two) times daily. 03/29/14  Yes Meredith Pel, MD  methocarbamol (ROBAXIN) 500 MG tablet Take 500 mg by mouth every 6 (six) hours as needed for muscle spasms. 03/29/14  Yes Meredith Pel, MD  omeprazole (PRILOSEC) 20 MG capsule Take 20 mg by mouth daily as needed (for heartburn).    Yes Historical Provider, MD  oxyCODONE (OXY IR/ROXICODONE) 5 MG immediate release tablet Take 5-10 mg by mouth every 3 (three) hours as needed for moderate pain or breakthrough pain. 03/29/14  Yes Meredith Pel, MD   BP 115/50  Pulse 82  Temp(Src) 98 F (36.7 C) (Oral)  Resp 24  SpO2 100% Physical Exam  Constitutional: He is oriented to person, place, and time. He appears well-developed and well-nourished. No distress.  HENT:  Head: Normocephalic and atraumatic.  Right Ear: Hearing normal.  Left Ear: Hearing normal.  Nose: Nose normal.  Mouth/Throat: Oropharynx is clear and moist and mucous membranes are  normal.  Eyes: Conjunctivae and EOM are normal. Pupils are equal, round, and reactive to light.  Neck: Normal range of motion. Neck supple.  Cardiovascular: Regular rhythm, S1 normal and S2 normal.  Exam reveals no gallop and no friction rub.   No murmur heard. Pulmonary/Chest: Effort normal and breath sounds normal. No respiratory distress. He exhibits tenderness (severe reproducible pain).    Abdominal: Soft. Normal appearance and bowel sounds are normal. There is no hepatosplenomegaly. There is no tenderness. There is no rebound, no guarding, no tenderness at McBurney's point and negative Murphy's sign. No hernia.  Musculoskeletal: Normal range of motion.  Neurological: He is alert and oriented to person, place, and time. He has normal strength. No cranial nerve deficit or sensory deficit. Coordination normal. GCS eye subscore is 4. GCS verbal subscore is 5. GCS motor subscore is 6.  Skin: Skin is warm, dry and intact. No rash noted. No cyanosis.  Psychiatric: He has a normal mood and affect. His speech is normal and behavior is normal. Thought content normal.    ED Course  Procedures (including critical care time) Labs Review Labs Reviewed  CBC - Abnormal; Notable for the following:    WBC 11.5 (*)    RBC 3.83 (*)    Hemoglobin 11.7 (*)    HCT 33.7 (*)    All other components within normal limits  BASIC METABOLIC PANEL - Abnormal; Notable for the following:    Potassium 3.3 (*)    Chloride 94 (*)    BUN 30 (*)    Creatinine, Ser 8.15 (*)    Calcium 11.3 (*)    GFR calc non Af Amer 8 (*)    GFR calc Af Amer 9 (*)    Anion gap 19 (*)    All other components within normal limits  PRO B NATRIURETIC PEPTIDE - Abnormal; Notable for the following:    Pro B Natriuretic peptide (BNP) 739.9 (*)    All other components within normal limits  I-STAT TROPOININ, ED  I-STAT TROPOININ, ED    Imaging Review Dg Chest 2 View  04/09/2014   CLINICAL DATA:  Left chest pain, shortness of  breath, fever  EXAM: CHEST  2 VIEW  COMPARISON:  03/20/2014  FINDINGS: Lungs are clear.  No pleural effusion or pneumothorax.  The heart is normal in size.  Visualized osseous structures are within normal limits.  IMPRESSION: No evidence of acute cardiopulmonary disease.   Electronically Signed   By: Julian Hy M.D.   On: 04/09/2014 20:30     EKG Interpretation   Date/Time:  Monday April 09 2014 16:36:35 EDT Ventricular Rate:  90 PR Interval:  190 QRS Duration: 96 QT Interval:  340 QTC Calculation: 415 R Axis:   45 Text  Interpretation:  Normal sinus rhythm Junctional ST depression,  probably normal Borderline ECG No significant change since last tracing  Confirmed by POLLINA  MD, CHRISTOPHER [phone]) on 04/09/2014 8:01:41 PM      MDM   Final diagnoses:  None   chest wall pain  Patient presents to the ER for evaluation of chest pain. Patient complains of sudden onset of a sharp pain while sitting at dialysis. Pain is over the mid and upper sternum region. This area is exquisitely tender to palpation. Patient's EKG is unchanged from previous. Troponin was negative. Patient's blood work was otherwise unremarkable. Patient held in the ER, second troponin ordered. There is no change, troponin was negative x2. Patient will be discharged. Treat for chest wall pain.    Orpah Greek, MD 04/09/14 2241

## 2014-04-09 NOTE — ED Notes (Signed)
Pt to ED via [facility] from [facility]- pt began having chest pain 2 hours into dialysis treatment states it began to get worse.  Pt completed 3 hours of dialysis prior to arrival- graft still accessed at this time (IV team paged upon pt arrival).  CP is reproducable upon palpation and movement- denies SOB- admits to N/V- pt has had 4mg  Zofran, 324 ASA, and 1 Nitro- pain unchanged at this time.  IV in place- sinus rhythm on monitor.

## 2014-04-09 NOTE — ED Notes (Signed)
Spoke with IV team to de access site.

## 2014-04-09 NOTE — Discharge Instructions (Signed)
Chest Wall Pain °Chest wall pain is pain in or around the bones and muscles of your chest. It may take up to 6 [place] to get better. It may take longer if you must stay physically active in your work and activities.  °CAUSES  °Chest wall pain may happen on its own. However, it may be caused by: °· A viral illness like the flu. °· Injury. °· Coughing. °· Exercise. °· Arthritis. °· Fibromyalgia. °· Shingles. °HOME CARE INSTRUCTIONS  °· Avoid overtiring physical activity. Try not to strain or perform activities that cause pain. This includes any activities using your chest or your abdominal and side muscles, especially if heavy weights are used. °· Put ice on the sore area. °¨ Put ice in a plastic bag. °¨ Place a towel between your skin and the bag. °¨ Leave the ice on for 15-20 minutes per hour while awake for the first 2 days. °· Only take over-the-counter or prescription medicines for pain, discomfort, or fever as directed by your caregiver. °SEEK IMMEDIATE MEDICAL CARE IF:  °· Your pain increases, or you are very uncomfortable. °· You have a fever. °· Your chest pain becomes worse. °· You have new, unexplained symptoms. °· You have nausea or vomiting. °· You feel sweaty or lightheaded. °· You have a cough with phlegm (sputum), or you cough up blood. °MAKE SURE YOU:  °· Understand these instructions. °· Will watch your condition. °· Will get help right away if you are not doing well or get worse. °Document Released: 07/20/2005 Document Revised: 10/12/2011 Document Reviewed: 03/16/2011 °[employer]® Patient Information ©2015 [employer]. This information is not intended to replace advice given to you by your health care provider. Make sure you discuss any questions you have with your health care provider. ° °

## 2014-04-17 NOTE — Discharge Summary (Signed)
Physician Discharge Summary  Patient ID: [name] MRN: [id number] DOB/AGE: 1986/03/06 28 y.o.  Admit date: 03/27/2014 Discharge date: 03/29/2014 Admission Diagnoses:  Active Problems:   Arthritis of knee   Discharge Diagnoses:  Same  Surgeries: Procedure(s): UNICOMPARTMENTAL ARTHROPLASTY on 03/27/2014   Consultants: Treatment Team:  Donetta Potts, MD Sol Blazing, MD  Discharged Condition: [facility] Course: [name] is an 28 y.o. male who was admitted 03/27/2014 with a chief complaint of right knee pain, and found to have a diagnosis of knee arthritis.  They were brought to the operating room on 03/27/2014 and underwent the above named procedures. He required extra time for PT and mobilization and was seen by renal for consult   Antibiotics given:  Anti-infectives   Start     Dose/Rate Route Frequency Ordered Stop   03/28/14 1400  ceFAZolin (ANCEF) IVPB 2 g/50 mL premix     2 g 100 mL/hr over 30 Minutes Intravenous  Once 03/28/14 0815 03/28/14 1729   03/27/14 0600  ceFAZolin (ANCEF) 3 g in dextrose 5 % 50 mL IVPB     3 g 160 mL/hr over 30 Minutes Intravenous On call to O.R. 03/26/14 1356 03/27/14 0800    .  Recent vital signs:  Filed Vitals:   03/29/14 1339  BP: 151/87  Pulse: 99  Temp: 97.8 F (36.6 C)  Resp: 16    Recent laboratory studies:  Results for orders placed during the hospital encounter of 03/27/14  PROTIME-INR      Result Value Ref Range   Prothrombin Time 13.9  11.6 - 15.2 seconds   INR 1.07  0.00 - 1.49  APTT      Result Value Ref Range   aPTT 42 (*) 24 - 37 seconds  CBC      Result Value Ref Range   WBC 12.3 (*) 4.0 - 10.5 K/uL   RBC 3.66 (*) 4.22 - 5.81 MIL/uL   Hemoglobin 11.3 (*) 13.0 - 17.0 g/dL   HCT 32.9 (*) 39.0 - 52.0 %   MCV 89.9  78.0 - 100.0 fL   MCH 30.9  26.0 - 34.0 pg   MCHC 34.3  30.0 - 36.0 g/dL   RDW 13.6  11.5 - 15.5 %   Platelets 170  150 - 400 K/uL  CBC      Result Value Ref Range   WBC 9.9   4.0 - 10.5 K/uL   RBC 3.51 (*) 4.22 - 5.81 MIL/uL   Hemoglobin 11.0 (*) 13.0 - 17.0 g/dL   HCT 31.5 (*) 39.0 - 52.0 %   MCV 89.7  78.0 - 100.0 fL   MCH 31.3  26.0 - 34.0 pg   MCHC 34.9  30.0 - 36.0 g/dL   RDW 13.6  11.5 - 15.5 %   Platelets 158  150 - 400 K/uL  RENAL FUNCTION PANEL      Result Value Ref Range   Sodium 132 (*) 137 - 147 mEq/L   Potassium 3.8  3.7 - 5.3 mEq/L   Chloride 87 (*) 96 - 112 mEq/L   CO2 20  19 - 32 mEq/L   Glucose, Bld 116 (*) 70 - 99 mg/dL   BUN 66 (*) 6 - 23 mg/dL   Creatinine, Ser 14.14 (*) 0.50 - 1.35 mg/dL   Calcium 10.7 (*) 8.4 - 10.5 mg/dL   Phosphorus 7.1 (*) 2.3 - 4.6 mg/dL   Albumin 3.5  3.5 - 5.2 g/dL   GFR calc non Af  Amer 4 (*) >90 mL/min   GFR calc Af Amer 5 (*) >90 mL/min   Anion gap 25 (*) 5 - 15  HEPATITIS B SURFACE ANTIGEN      Result Value Ref Range   Hepatitis B Surface Ag NEGATIVE  NEGATIVE  POCT I-STAT 4, (NA,K, GLUC, HGB,HCT)      Result Value Ref Range   Sodium 135 (*) 137 - 147 mEq/L   Potassium 3.3 (*) 3.7 - 5.3 mEq/L   Glucose, Bld 104 (*) 70 - 99 mg/dL   HCT 39.0  39.0 - 52.0 %   Hemoglobin 13.3  13.0 - 17.0 g/dL    Discharge Medications:     Medication List    STOP taking these medications       acetaminophen-codeine 300-30 MG per tablet  Commonly known as:  TYLENOL #3      TAKE these medications       calcium acetate 667 MG capsule  Commonly known as:  PHOSLO  Take 3,335 mg by mouth 3 (three) times daily with meals.     calcium carbonate 500 MG chewable tablet  Commonly known as:  TUMS - dosed in mg elemental calcium  Chew 4 tablets by mouth 3 (three) times daily with meals.     omeprazole 20 MG capsule  Commonly known as:  PRILOSEC  Take 20 mg by mouth daily as needed (for heartburn).        Diagnostic Studies: Dg Chest 2 View  04/09/2014   CLINICAL DATA:  Left chest pain, shortness of breath, fever  EXAM: CHEST  2 VIEW  COMPARISON:  03/20/2014  FINDINGS: Lungs are clear.  No pleural effusion or  pneumothorax.  The heart is normal in size.  Visualized osseous structures are within normal limits.  IMPRESSION: No evidence of acute cardiopulmonary disease.   Electronically Signed   By: Julian Hy M.D.   On: 04/09/2014 20:30   Dg Chest 2 View  03/20/2014   CLINICAL DATA:  PRE-OPERATIVE, knee replacement surgery  EXAM: CHEST - 2 VIEW  COMPARISON:  01/10/2014  FINDINGS: Lungs are clear. Improved aeration. Heart size and mediastinal contours are within normal limits. No effusion. Visualized skeletal structures are unremarkable.  IMPRESSION: No acute cardiopulmonary disease.   Electronically Signed   By: Arne Cleveland M.D.   On: 03/20/2014 12:53    Disposition: 01-Home or Self Care      Discharge Instructions   Call MD / Call 911    Complete by:  As directed   If you experience chest pain or shortness of breath, CALL 911 and be transported to the hospital emergency room.  If you develope a fever above 101 F, pus (white drainage) or increased drainage or redness at the wound, or calf pain, call your surgeon's office.     Constipation Prevention    Complete by:  As directed   Drink plenty of fluids.  Prune juice may be helpful.  You may use a stool softener, such as Colace (over the counter) 100 mg twice a day.  Use MiraLax (over the counter) for constipation as needed.     Diet - low sodium heart healthy    Complete by:  As directed      Discharge instructions    Complete by:  As directed   CPM three times a day for 1 hour Kep incision dry     Increase activity slowly as tolerated    Complete by:  As directed  Follow-up Information   Follow up with [facility]. (Someone from [place] will contact you concerning start date and time for physical therapy.)    Contact information:   [place] [address] [place] [address] [phone]        Signed: Meredith Pel 04/17/2014, 9:50 PM

## 2014-04-30 ENCOUNTER — Ambulatory Visit: Payer: Medicare Other | Admitting: Dietician

## 2014-05-03 ENCOUNTER — Ambulatory Visit (HOSPITAL_COMMUNITY)
Admission: RE | Admit: 2014-05-03 | Discharge: 2014-05-03 | Disposition: A | Discharge status: Home / Self Care | Payer: Medicare Other | Source: Ambulatory Visit | Attending: Surgery | Admitting: Surgery

## 2014-05-03 DIAGNOSIS — N186 End stage renal disease: Secondary | ICD-10-CM | POA: Diagnosis present

## 2014-05-03 DIAGNOSIS — R109 Unspecified abdominal pain: Secondary | ICD-10-CM | POA: Insufficient documentation

## 2014-05-03 DIAGNOSIS — N281 Cyst of kidney, acquired: Secondary | ICD-10-CM | POA: Insufficient documentation

## 2014-05-07 ENCOUNTER — Encounter (HOSPITAL_COMMUNITY): Admission: RE | Payer: Self-pay | Source: Ambulatory Visit

## 2014-05-07 ENCOUNTER — Ambulatory Visit (HOSPITAL_COMMUNITY): Admission: RE | Admit: 2014-05-07 | Payer: Medicare Other | Source: Ambulatory Visit | Admitting: Surgery

## 2014-05-07 SURGERY — BREATH TEST, FOR HELICOBACTER PYLORI

## 2014-05-08 ENCOUNTER — Ambulatory Visit: Payer: Medicare Other | Attending: Orthopedic Surgery

## 2014-05-08 DIAGNOSIS — Z5189 Encounter for other specified aftercare: Secondary | ICD-10-CM | POA: Insufficient documentation

## 2014-05-08 DIAGNOSIS — Z96652 Presence of left artificial knee joint: Secondary | ICD-10-CM | POA: Diagnosis not present

## 2014-05-08 DIAGNOSIS — M6281 Muscle weakness (generalized): Secondary | ICD-10-CM | POA: Diagnosis not present

## 2014-05-08 DIAGNOSIS — M25561 Pain in right knee: Secondary | ICD-10-CM | POA: Insufficient documentation

## 2014-05-08 DIAGNOSIS — R262 Difficulty in walking, not elsewhere classified: Secondary | ICD-10-CM | POA: Diagnosis not present

## 2014-05-15 ENCOUNTER — Ambulatory Visit (HOSPITAL_COMMUNITY)
Admission: RE | Admit: 2014-05-15 | Discharge: 2014-05-15 | Disposition: A | Discharge status: Home / Self Care | Payer: Medicare Other | Source: Ambulatory Visit | Attending: Surgery | Admitting: Surgery

## 2014-05-15 ENCOUNTER — Encounter (HOSPITAL_COMMUNITY)
Admission: RE | Disposition: A | Discharge status: Home / Self Care | Payer: Self-pay | Source: Ambulatory Visit | Attending: Surgery

## 2014-05-15 DIAGNOSIS — Z992 Dependence on renal dialysis: Secondary | ICD-10-CM | POA: Diagnosis not present

## 2014-05-15 DIAGNOSIS — N2581 Secondary hyperparathyroidism of renal origin: Secondary | ICD-10-CM | POA: Diagnosis not present

## 2014-05-15 DIAGNOSIS — M13869 Other specified arthritis, unspecified knee: Secondary | ICD-10-CM | POA: Insufficient documentation

## 2014-05-15 DIAGNOSIS — I120 Hypertensive chronic kidney disease with stage 5 chronic kidney disease or end stage renal disease: Secondary | ICD-10-CM | POA: Diagnosis not present

## 2014-05-15 DIAGNOSIS — N186 End stage renal disease: Secondary | ICD-10-CM | POA: Diagnosis not present

## 2014-05-15 DIAGNOSIS — Z6841 Body Mass Index (BMI) 40.0 and over, adult: Secondary | ICD-10-CM | POA: Insufficient documentation

## 2014-05-15 HISTORY — PX: BREATH TEK H PYLORI: SHX5422

## 2014-05-15 SURGERY — BREATH TEST, FOR HELICOBACTER PYLORI

## 2014-05-15 NOTE — Progress Notes (Signed)
05/15/14 [place]  Referring MD newman  Time of Last PO Intake 2030 (05/14/14)  Baseline Breath At: 0800  Pranactin Given At: 0801  Post-Dose Breath At: 0816  Sample 1 3.4  Sample 2 2.6  Test Negative

## 2014-05-16 ENCOUNTER — Encounter (HOSPITAL_COMMUNITY): Payer: Self-pay | Admitting: Surgery

## 2014-05-16 NOTE — Progress Notes (Signed)
05/15/14 0800  BREATH TEK ASSESSMENT  Referring MD Alphonsa Overall  Time of Last PO Intake 2030 (on 05/14/2014)  Baseline Breath At: 0800  Pranactin Given At: 0801  Post-Dose Breath At: 0816  Sample 1 3.4  Sample 2 2.6  Test Negative

## 2014-05-17 ENCOUNTER — Ambulatory Visit: Payer: Medicare Other

## 2014-05-17 DIAGNOSIS — Z5189 Encounter for other specified aftercare: Secondary | ICD-10-CM | POA: Diagnosis not present

## 2014-05-24 ENCOUNTER — Ambulatory Visit: Payer: Medicare Other

## 2014-05-24 DIAGNOSIS — Z5189 Encounter for other specified aftercare: Secondary | ICD-10-CM | POA: Diagnosis not present

## 2014-05-29 ENCOUNTER — Ambulatory Visit: Payer: Medicare Other

## 2014-05-29 DIAGNOSIS — Z5189 Encounter for other specified aftercare: Secondary | ICD-10-CM | POA: Diagnosis not present

## 2014-05-30 ENCOUNTER — Other Ambulatory Visit (HOSPITAL_COMMUNITY): Payer: Self-pay | Admitting: Orthopedic Surgery

## 2014-05-30 DIAGNOSIS — M79661 Pain in right lower leg: Secondary | ICD-10-CM

## 2014-05-30 DIAGNOSIS — M7989 Other specified soft tissue disorders: Principal | ICD-10-CM

## 2014-05-31 ENCOUNTER — Ambulatory Visit (HOSPITAL_COMMUNITY)
Admission: RE | Admit: 2014-05-31 | Discharge: 2014-05-31 | Disposition: A | Discharge status: Home / Self Care | Payer: Medicare Other | Source: Ambulatory Visit | Attending: Surgery | Admitting: Surgery

## 2014-05-31 DIAGNOSIS — M7989 Other specified soft tissue disorders: Secondary | ICD-10-CM | POA: Diagnosis present

## 2014-05-31 DIAGNOSIS — M79661 Pain in right lower leg: Secondary | ICD-10-CM | POA: Insufficient documentation

## 2014-05-31 DIAGNOSIS — M79609 Pain in unspecified limb: Secondary | ICD-10-CM

## 2014-05-31 NOTE — Progress Notes (Signed)
*  Preliminary Results* Right lower extremity venous duplex completed. Right lower extremity is negative for deep vein thrombosis. There is no evidence of right Baker's cyst.  05/31/2014 3:43 PM  Maudry Mayhew, RVT, RDCS, RDMS

## 2014-06-04 ENCOUNTER — Ambulatory Visit: Payer: Medicare Other | Attending: Orthopedic Surgery

## 2014-06-04 DIAGNOSIS — Z96652 Presence of left artificial knee joint: Secondary | ICD-10-CM | POA: Insufficient documentation

## 2014-06-04 DIAGNOSIS — M25561 Pain in right knee: Secondary | ICD-10-CM | POA: Insufficient documentation

## 2014-06-04 DIAGNOSIS — Z5189 Encounter for other specified aftercare: Secondary | ICD-10-CM | POA: Insufficient documentation

## 2014-06-04 DIAGNOSIS — R262 Difficulty in walking, not elsewhere classified: Secondary | ICD-10-CM | POA: Insufficient documentation

## 2014-06-04 DIAGNOSIS — M6281 Muscle weakness (generalized): Secondary | ICD-10-CM | POA: Insufficient documentation

## 2014-06-07 ENCOUNTER — Encounter: Payer: Self-pay | Admitting: Physical Therapy

## 2014-06-07 ENCOUNTER — Encounter: Payer: Medicare Other | Admitting: Physical Therapy

## 2014-06-07 ENCOUNTER — Ambulatory Visit: Payer: Medicare Other | Admitting: Physical Therapy

## 2014-06-07 DIAGNOSIS — M25561 Pain in right knee: Secondary | ICD-10-CM | POA: Diagnosis not present

## 2014-06-07 DIAGNOSIS — Z96651 Presence of right artificial knee joint: Secondary | ICD-10-CM

## 2014-06-07 DIAGNOSIS — R262 Difficulty in walking, not elsewhere classified: Secondary | ICD-10-CM | POA: Diagnosis not present

## 2014-06-07 DIAGNOSIS — Z96652 Presence of left artificial knee joint: Secondary | ICD-10-CM | POA: Diagnosis not present

## 2014-06-07 DIAGNOSIS — Z5189 Encounter for other specified aftercare: Secondary | ICD-10-CM | POA: Diagnosis present

## 2014-06-07 DIAGNOSIS — M6281 Muscle weakness (generalized): Secondary | ICD-10-CM | POA: Diagnosis not present

## 2014-06-07 NOTE — Therapy (Addendum)
Physical Therapy Treatment  Patient Details  Name: [name] MRN: [id number] Date of Birth: 28-Jun-1986  Encounter Date: 06/07/2014      PT End of Session - 06/07/14 1428    Visit Number 1   Number of Visits 16   Date for PT Re-Evaluation 07/05/14   PT Start Time 1332   PT Stop Time 1430   PT Time Calculation (min) 58 min   Activity Tolerance Patient tolerated treatment well      Past Medical History  Diagnosis Date  . Hyperparathyroidism   . Morbid obesity   . Arthritis   . Complication of anesthesia     A little while to wake up after knee surgery in 2008  . Family history of anesthesia complication     mom slow to wake up  . Headache(784.0)     occasionally  . Dizziness     when coming off of dialysis  . Joint pain   . Joint swelling   . GERD (gastroesophageal reflux disease)     takes Omeprazole as needed  . Renal disorder     MWF and goes to [facility]  . Hypertension     Past Surgical History  Procedure Laterality Date  . Av fistula placement Bilateral     Uses Right arm  . Esophagogastroduodenoscopy endoscopy    . Knee arthroscopy Left 2007  . Parathyroidectomy N/A 03/30/2013    Procedure: TOTAL PARATHYROIDECTOMY WITH AUTOTRANSPLANT;  Surgeon: Earnstine Regal, MD;  Location: [place];  Service: General;  Laterality: N/A;  Autotransplant to left lower arm.  . Total knee arthroplasty Right 03/27/2014    DR Marlou Sa  . Total knee arthroplasty Right 03/27/2014    Procedure: UNICOMPARTMENTAL ARTHROPLASTY;  Surgeon: Meredith Pel, MD;  Location: [place];  Service: Orthopedics;  Laterality: Right;  . Breath tek h pylori N/A 05/15/2014    Procedure: BREATH TEK H PYLORI;  Surgeon: Alphonsa Overall, MD;  Location: [facility];  Service: General;  Laterality: N/A;    There were no vitals taken for this visit.  Visit Diagnosis:  S/P right unicompartmental knee replacement      Subjective Assessment - 06/07/14 1342    Symptoms pain, numbness at night   Limitations  Standing;Other (comment);Sitting  slow with stairs   How long can you sit comfortably? 1 hour   How long can you stand comfortably? 1-2 hours   Patient Stated Goals Pain free mobility   Currently in Pain? Yes   Pain Score 6    Pain Location Knee   Pain Orientation Right   Pain Descriptors / Indicators Aching   Pain Type Other (Comment)  post surgical   Pain Onset More than a month ago   Pain Frequency Constant   Pain Relieving Factors ice, pain meds   Effect of Pain on Daily Activities Normal activities take extra time   Multiple Pain Sites No          [facility] PT Assessment - 06/07/14 1426    AROM   Right Knee Extension 0   Right Knee Flexion --  102 deg AAROM   Strength   Right Knee Flexion 4/5   Right Knee Extension 5/5          [facility] Adult PT Treatment/Exercise - 06/07/14 1409    Lumbar Exercises: Machines for Strengthening   Other Lumbar Machine Exercise NuStep L 7 , 8 min for AROm   Knee/Hip Exercises: Standing   Step Down 1 set;10 reps;Right  8  inch with UE support   Step Down Limitations 1 set 10 no UE support with 6 inch step   Other Standing Knee Exercises sit to stand x10 with ball squeeze            PT Short Term Goals - 06/07/14 1432    PT SHORT TERM GOAL #1   Title Pt will be I with initial HEP   Time 4   Period Keelan   Status Achieved   PT SHORT TERM GOAL #2   Title Pt will report pain decrease 25% in Rt. knee with walking in store   Time 4   Period Caba   Status On-going   PT SHORT TERM GOAL #3   Title Pt. will improve AROM in Rt. knee flexion to 115 deg   Time 4   Period Dubray   Status On-going   PT SHORT TERM GOAL #4   Title Pt. will improve AROm in Rt. knee ext to -5 deg   Time 4   Period Free   Status Achieved          PT Long Term Goals - 06/07/14 1436    PT LONG TERM GOAL #1   Title Pt. will understand and use RICE   Time 8   Period Mckelvy   Status Achieved   PT LONG TERM GOAL #2   Title Pt. will be I with HEP  advanced   Time 8   Period Heinze   Status On-going   PT LONG TERM GOAL #3   Title Pt. will report pain dec 75% with walking and general community mobility   Time 8   Period Malstrom   Status On-going   PT LONG TERM GOAL #4   Title Pt will improve AROM in Rt. knee fleixon to 120 deg to ease walking on uneven ground   Time 8   Period Salek   Status On-going   PT LONG TERM GOAL #5   Title Pt. will report walking stairsand step over step fashion with one rail   Time 8   Period Buer   Status On-going          Plan - 06/07/14 1429    Clinical Impression Statement Patient is making gains with functional mobility, reducing pain and improving ROM.  Pain is increased slightly with exercises but understands necessity.     Pt will benefit from skilled therapeutic intervention in order to improve on the following deficits Decreased mobility;Decreased strength;Difficulty walking;Decreased range of motion;Increased edema;Decreased activity tolerance;Abnormal gait  Abnormal knee alignment   Rehab Potential Excellent   PT Frequency Min 2X/week   PT Duration 8 Macdougal   PT Treatment/Interventions Modalities;Gait training;Therapeutic exercise;Patient/family education;Stair training;Functional mobility training;Manual techniques   PT Plan cont to progress as tolerated         Problem List Patient Active Problem List   Diagnosis Date Noted  . Arthritis of knee 03/27/2014  . Morbid obesity, weight 291, BMI - 47 02/15/2014  . End stage renal disease 07/12/2013  . Hyperparathyroidism, secondary 03/14/2013                                            PAA,JENNIFER 06/07/2014, 2:54 PM

## 2014-06-07 NOTE — Therapy (Deleted)
Physical Therapy Treatment  Patient Details  Name: [name] MRN: [id number] Date of Birth: 03/09/1986  Encounter Date: 06/07/2014      PT End of Session - 06/07/14 1428    Visit Number 1   Number of Visits 16   Date for PT Re-Evaluation 07/05/14   PT Start Time 1332   PT Stop Time 1430   PT Time Calculation (min) 58 min   Activity Tolerance Patient tolerated treatment well      Past Medical History  Diagnosis Date  . Hyperparathyroidism   . Morbid obesity   . Arthritis   . Complication of anesthesia     A little while to wake up after knee surgery in 2008  . Family history of anesthesia complication     mom slow to wake up  . Headache(784.0)     occasionally  . Dizziness     when coming off of dialysis  . Joint pain   . Joint swelling   . GERD (gastroesophageal reflux disease)     takes Omeprazole as needed  . Renal disorder     MWF and goes to [facility]  . Hypertension     Past Surgical History  Procedure Laterality Date  . Av fistula placement Bilateral     Uses Right arm  . Esophagogastroduodenoscopy endoscopy    . Knee arthroscopy Left 2007  . Parathyroidectomy N/A 03/30/2013    Procedure: TOTAL PARATHYROIDECTOMY WITH AUTOTRANSPLANT;  Surgeon: Earnstine Regal, MD;  Location: [place];  Service: General;  Laterality: N/A;  Autotransplant to left lower arm.  . Total knee arthroplasty Right 03/27/2014    DR Marlou Sa  . Total knee arthroplasty Right 03/27/2014    Procedure: UNICOMPARTMENTAL ARTHROPLASTY;  Surgeon: Meredith Pel, MD;  Location: [place];  Service: Orthopedics;  Laterality: Right;  . Breath tek h pylori N/A 05/15/2014    Procedure: BREATH TEK H PYLORI;  Surgeon: Alphonsa Overall, MD;  [facility];  Service: General;  Laterality: N/A;    There were no vitals taken for this visit.  Visit Diagnosis:  S/P right unicompartmental knee replacement      Subjective Assessment - 06/07/14 1342    Symptoms pain, numbness at night   Limitations  Standing;Other (comment);Sitting  slow with stairs   How long can you sit comfortably? 1 hour   How long can you stand comfortably? 1-2 hours   Patient Stated Goals Pain free mobility   Currently in Pain? Yes   Pain Score 6    Pain Location Knee   Pain Orientation Right   Pain Descriptors / Indicators Aching   Pain Type Other (Comment)  post surgical   Pain Onset More than a month ago   Pain Frequency Constant   Pain Relieving Factors ice, pain meds   Effect of Pain on Daily Activities Normal activities take extra time   Multiple Pain Sites No          [facility] PT Assessment - 06/07/14 1426    AROM   Right Knee Extension 0   Right Knee Flexion --  102 deg AAROM   Strength   Right Knee Flexion 4/5   Right Knee Extension 5/5          [facility] Adult PT Treatment/Exercise - 06/07/14 1409    Lumbar Exercises: Machines for Strengthening   Other Lumbar Machine Exercise NuStep L 7 , 8 min for AROm   Knee/Hip Exercises: Standing   Step Down 1 set;10 reps;Right  8  inch with UE support   Step Down Limitations 1 set 10 no UE support with 6 inch step   Other Standing Knee Exercises sit to stand x10 with ball squeeze            PT Short Term Goals - 06/07/14 1432    PT SHORT TERM GOAL #1   Title Pt will be I with initial HEP   Time 4   Period Vos   Status Achieved   PT SHORT TERM GOAL #2   Title Pt will report pain decrease 25% in Rt. knee with walking in store   Time 4   Period Fialkowski   Status On-going   PT SHORT TERM GOAL #3   Title Pt. will improve AROM in Rt. knee flexion to 115 deg   Time 4   Period Debroux   Status On-going   PT SHORT TERM GOAL #4   Title Pt. will improve AROm in Rt. knee ext to -5 deg   Time 4   Period Frieze   Status Achieved          PT Long Term Goals - 06/07/14 1436    PT LONG TERM GOAL #1   Title Pt. will understand and use RICE   Time 8   Period Shawley   Status Achieved   PT LONG TERM GOAL #2   Title Pt. will be I with HEP  advanced   Time 8   Period Totzke   Status On-going   PT LONG TERM GOAL #3   Title Pt. will report pain dec 75% with walking and general community mobility   Time 8   Period Sato   Status On-going   PT LONG TERM GOAL #4   Title Pt will improve AROM in Rt. knee fleixon to 120 deg to ease walking on uneven ground   Time 8   Period Kalbfleisch   Status On-going   PT LONG TERM GOAL #5   Title Pt. will report walking stairsand step over step fashion with one rail   Time 8   Period Dingus   Status On-going          Plan - 06/07/14 1429    Clinical Impression Statement Patient is making gains with functional mobility, reducing pain and improving ROM.  Pain is increased slightly with exercises but understands necessity.     Pt will benefit from skilled therapeutic intervention in order to improve on the following deficits Decreased mobility;Decreased strength;Difficulty walking;Decreased range of motion;Increased edema;Decreased activity tolerance;Abnormal gait  Abnormal knee alignment   Rehab Potential Excellent   PT Frequency Min 2X/week   PT Duration 8 Fogleman   PT Treatment/Interventions Modalities;Gait training;Therapeutic exercise;Patient/family education;Stair training;Functional mobility training;Manual techniques   PT Plan cont to progress as tolerated         Problem List Patient Active Problem List   Diagnosis Date Noted  . Arthritis of knee 03/27/2014  . Morbid obesity, weight 291, BMI - 47 02/15/2014  . End stage renal disease 07/12/2013  . Hyperparathyroidism, secondary 03/14/2013                                            Adamarys Shall 06/07/2014, 2:46 PM

## 2014-06-11 ENCOUNTER — Encounter: Payer: Self-pay | Admitting: Physical Therapy

## 2014-06-11 ENCOUNTER — Ambulatory Visit: Payer: Medicare Other | Admitting: Physical Therapy

## 2014-06-11 DIAGNOSIS — Z96651 Presence of right artificial knee joint: Secondary | ICD-10-CM

## 2014-06-11 DIAGNOSIS — Z5189 Encounter for other specified aftercare: Secondary | ICD-10-CM | POA: Diagnosis not present

## 2014-06-11 NOTE — Therapy (Signed)
Physical Therapy Treatment  Patient Details  Name: [name] MRN: [id number] Date of Birth: Nov 17, 1985  Encounter Date: 06/11/2014      PT End of Session - 06/11/14 1152    Visit Number 6   Number of Visits 16   Date for PT Re-Evaluation 07/05/14   PT Start Time 1113   PT Stop Time 1145   PT Time Calculation (min) 32 min   Activity Tolerance Patient tolerated treatment well;Patient limited by pain      Past Medical History  Diagnosis Date  . Hyperparathyroidism   . Morbid obesity   . Arthritis   . Complication of anesthesia     A little while to wake up after knee surgery in 2008  . Family history of anesthesia complication     mom slow to wake up  . Headache(784.0)     occasionally  . Dizziness     when coming off of dialysis  . Joint pain   . Joint swelling   . GERD (gastroesophageal reflux disease)     takes Omeprazole as needed  . Renal disorder     MWF and goes to [facility]  . Hypertension     Past Surgical History  Procedure Laterality Date  . Av fistula placement Bilateral     Uses Right arm  . Esophagogastroduodenoscopy endoscopy    . Knee arthroscopy Left 2007  . Parathyroidectomy N/A 03/30/2013    Procedure: TOTAL PARATHYROIDECTOMY WITH AUTOTRANSPLANT;  Surgeon: Earnstine Regal, MD;  Location: [place];  Service: General;  Laterality: N/A;  Autotransplant to left lower arm.  . Total knee arthroplasty Right 03/27/2014    DR Marlou Sa  . Total knee arthroplasty Right 03/27/2014    Procedure: UNICOMPARTMENTAL ARTHROPLASTY;  Surgeon: Meredith Pel, MD;  Location: [place];  Service: Orthopedics;  Laterality: Right;  . Breath tek h pylori N/A 05/15/2014    Procedure: BREATH TEK H PYLORI;  Surgeon: Alphonsa Overall, MD;  [facility];  Service: General;  Laterality: N/A;    There were no vitals taken for this visit.  Visit Diagnosis:  S/P right unicompartmental knee replacement        [facility] PT Assessment - 06/11/14 1131    AROM   Right Knee  Flexion 102          [facility] Adult PT Treatment/Exercise - 06/11/14 1121    Knee/Hip Exercises: Aerobic   Stationary Bike Recumbent bike x 8 min for ROM   Knee/Hip Exercises: Standing   Forward Step Up Right;1 set;10 reps;Step Height: 4";Step Height: 6";Limitations   Forward Step Up Limitations min UE support needed   Step Down Right;1 set;10 reps;Step Height: 4";Step Height: 6";Limitations   Step Down Limitations min UE support and cues to decrease compensations   Knee/Hip Exercises: Supine   Heel Slides AROM;10 reps;Right  with strap          Education - 06/11/14 1152    Education provided No              Plan - 06/11/14 1154    Clinical Impression Statement Pt. R knee flexion 102 degrees.  Pt.will continue to benefit from skilled PT to maximize functional mobility.   Pt will benefit from skilled therapeutic intervention in order to improve on the following deficits Abnormal gait;Decreased range of motion;Difficulty walking;Impaired flexibility;Pain;Impaired perceived functional ability;Decreased strength;Decreased mobility;Increased edema;Decreased activity tolerance;Decreased balance   Rehab Potential Good   PT Frequency 2x / week   PT Duration 8 Beier  PT Treatment/Interventions ADLs/Self Care Home Management;Moist Heat;Therapeutic activities;Patient/family education;Scar mobilization;Passive range of motion;Therapeutic exercise;Ultrasound;Gait training;Balance training;Manual techniques;Manual lymph drainage;Neuromuscular re-education;Stair training;Electrical Stimulation;Functional mobility training   PT Next Visit Plan continue ROM and functional strengthening   PT Home Exercise Plan continue as instructed   Consulted and Agree with Plan of Care Patient        Problem List Patient Active Problem List   Diagnosis Date Noted  . Arthritis of knee 03/27/2014  . Morbid obesity, weight 291, BMI - 47 02/15/2014  . End stage renal disease 07/12/2013  .  Hyperparathyroidism, secondary 03/14/2013                                            Faustino Congress K 06/11/2014, 11:58 AM

## 2014-06-13 ENCOUNTER — Ambulatory Visit: Payer: Medicare Other | Admitting: Physical Therapy

## 2014-06-13 ENCOUNTER — Encounter: Payer: Self-pay | Admitting: Physical Therapy

## 2014-06-13 DIAGNOSIS — Z5189 Encounter for other specified aftercare: Secondary | ICD-10-CM | POA: Diagnosis not present

## 2014-06-13 DIAGNOSIS — Z96651 Presence of right artificial knee joint: Secondary | ICD-10-CM

## 2014-06-13 NOTE — Therapy (Signed)
Physical Therapy Treatment  Patient Details  Name: [name] MRN: [id number] Date of Birth: 06-11-1986  Encounter Date: 06/13/2014      PT End of Session - 06/13/14 1133    Visit Number 7   Number of Visits 16   PT Start Time 1100   PT Stop Time 1146   PT Time Calculation (min) 46 min   Activity Tolerance Patient tolerated treatment well;Patient limited by pain      Past Medical History  Diagnosis Date  . Hyperparathyroidism   . Morbid obesity   . Arthritis   . Complication of anesthesia     A little while to wake up after knee surgery in 2008  . Family history of anesthesia complication     mom slow to wake up  . Headache(784.0)     occasionally  . Dizziness     when coming off of dialysis  . Joint pain   . Joint swelling   . GERD (gastroesophageal reflux disease)     takes Omeprazole as needed  . Renal disorder     MWF and goes to [facility]  . Hypertension     Past Surgical History  Procedure Laterality Date  . Av fistula placement Bilateral     Uses Right arm  . Esophagogastroduodenoscopy endoscopy    . Knee arthroscopy Left 2007  . Parathyroidectomy N/A 03/30/2013    Procedure: TOTAL PARATHYROIDECTOMY WITH AUTOTRANSPLANT;  Surgeon: Earnstine Regal, MD;  Location: [place];  Service: General;  Laterality: N/A;  Autotransplant to left lower arm.  . Total knee arthroplasty Right 03/27/2014    DR Marlou Sa  . Total knee arthroplasty Right 03/27/2014    Procedure: UNICOMPARTMENTAL ARTHROPLASTY;  Surgeon: Meredith Pel, MD;  Location: [facility];  Service: Orthopedics;  Laterality: Right;  . Breath tek h pylori N/A 05/15/2014    Procedure: BREATH TEK H PYLORI;  Surgeon: Alphonsa Overall, MD;  Location: [facility];  Service: General;  Laterality: N/A;    There were no vitals taken for this visit.  Visit Diagnosis:  S/P right unicompartmental knee replacement      Subjective Assessment - 06/13/14 1107    Symptoms Pt. has pain today see below   Currently in Pain?  Yes   Pain Score 6    Pain Location Knee   Pain Orientation Right;Anterior   Pain Type Surgical pain   Pain Onset More than a month ago   Pain Frequency Intermittent   Multiple Pain Sites No            [facility] Adult PT Treatment/Exercise - 06/13/14 0001    Lumbar Exercises: Machines for Strengthening   Cybex Knee Extension --  1 plate x10 slow, 1 plate eccentric Rt only x10   Knee/Hip Exercises: Aerobic   Stationary Bike --  Recumbant bike L2 6 min for AROM   Knee/Hip Exercises: Standing   Terminal Knee Extension Strengthening;Right;1 set;20 reps;Other (comment)   Theraband Level (Terminal Knee Extension) --  with ball    Forward Step Up Right;1 set;20 reps;Hand Hold: 1;Step Height: 4"   Step Down Right;1 set;15 reps;Step Height: 4"   Modalities   Modalities Cryotherapy;Electrical Stimulation   Cryotherapy   Number Minutes Cryotherapy --  15 min   Cryotherapy Location Knee   Type of Cryotherapy Ice pack   Electrical Stimulation   Electrical Stimulation Location --  R knee   Electrical Stimulation Goals Pain          PT Education - 06/13/14  30    Education provided Yes   Education Details IFC   Person(s) Educated Patient   Methods Explanation   Comprehension Verbalized understanding              Plan - 06/13/14 1135    Clinical Impression Statement Progressing towards goals, works hard despite reported pain. Reports relief of pain, just stiffness post IFC/ice   Rehab Potential Good   PT Plan check AROM and progress strength (hip and knee), try step downs again        Problem List Patient Active Problem List   Diagnosis Date Noted  . Arthritis of knee 03/27/2014  . Morbid obesity, weight 291, BMI - 47 02/15/2014  . End stage renal disease 07/12/2013  . Hyperparathyroidism, secondary 03/14/2013                                              Antino Mayabb 06/13/2014, 11:50 AM

## 2014-06-26 ENCOUNTER — Ambulatory Visit: Payer: Medicare Other

## 2014-06-27 ENCOUNTER — Ambulatory Visit: Payer: Medicare Other

## 2014-07-02 NOTE — Therapy (Signed)
  Patient Details  Name: Makenzie Vittorio MRN: [id number] Date of Birth: [birth year]  Encounter Date: 07/02/2014  PHYSICAL THERAPY DISCHARGE SUMMARY  Pt is discharged from outpt PT due to no showed x 3 visits. PT left message for pt to notify of discharge and if further visits are needed, to requested new script from MD. Goals not currently re-assessed due to DC without a visit.   Visits from Start of Care: 7  Current functional level related to goals / functional outcomes:  PT Goals         06/07/14 1432    PT SHORT TERM GOAL #1    Title  Pt will be I with initial HEP    Time  4    Period  Corrie    Status  Achieved    PT SHORT TERM GOAL #2    Title  Pt will report pain decrease 25% in Rt. knee with walking in store    Time  4    Period  Berber    Status  On-going    PT SHORT TERM GOAL #3    Title  Pt. will improve AROM in Rt. knee flexion to 115 deg    Time  4    Period  Sarrazin    Status  On-going    PT SHORT TERM GOAL #4    Title  Pt. will improve AROm in Rt. knee ext to -5 deg    Time  4    Period  Mcneil    Status  Achieved        06/07/14 1436    PT LONG TERM GOAL #1    Title  Pt. will understand and use RICE    Time  8    Period  [place]    Status  Achieved    PT LONG TERM GOAL #2    Title  Pt. will be I with HEP advanced    Time  8    Period  [place]one    Status  On-going    PT LONG TERM GOAL #3    Title  Pt. will report pain dec 75% with walking and general community mobility    Time  8    Period  Raya    Status  On-going    PT LONG TERM GOAL #4    Title  Pt will improve AROM in Rt. knee fleixon to 120 deg to ease walking on uneven ground    Time  8    Period  Snethen    Status  On-going    PT LONG TERM GOAL #5    Title  Pt. will report walking stairsand step over step fashion with one rail    Time  8    Period  Mctier    Status  On-going       Remaining  deficits: Unknown due to discharge without a visit.    Education / Equipment: HEP  Plan: Patient agrees to discharge.  Patient goals were not met. Patient is being discharged due to not returning since the last visit.  ?????         Dollene Cleveland, PT, DPT 07/02/2014 10:32 AM Phone: [phone] Fax: [phone]

## 2014-08-29 ENCOUNTER — Other Ambulatory Visit (HOSPITAL_COMMUNITY): Payer: Self-pay | Admitting: Orthopedic Surgery

## 2014-08-29 DIAGNOSIS — M25562 Pain in left knee: Secondary | ICD-10-CM

## 2014-09-11 ENCOUNTER — Ambulatory Visit (HOSPITAL_COMMUNITY)
Admission: RE | Admit: 2014-09-11 | Discharge: 2014-09-11 | Disposition: A | Discharge status: Home / Self Care | Payer: Medicare Other | Source: Ambulatory Visit | Attending: Orthopedic Surgery | Admitting: Orthopedic Surgery

## 2014-09-11 DIAGNOSIS — S83282A Other tear of lateral meniscus, current injury, left knee, initial encounter: Secondary | ICD-10-CM | POA: Insufficient documentation

## 2014-09-11 DIAGNOSIS — X58XXXA Exposure to other specified factors, initial encounter: Secondary | ICD-10-CM | POA: Diagnosis not present

## 2014-09-11 DIAGNOSIS — M179 Osteoarthritis of knee, unspecified: Secondary | ICD-10-CM | POA: Insufficient documentation

## 2014-09-11 DIAGNOSIS — M25562 Pain in left knee: Secondary | ICD-10-CM | POA: Diagnosis present

## 2014-10-04 ENCOUNTER — Ambulatory Visit (INDEPENDENT_AMBULATORY_CARE_PROVIDER_SITE_OTHER): Payer: Medicare Other | Admitting: Psychiatry

## 2014-10-11 ENCOUNTER — Ambulatory Visit (INDEPENDENT_AMBULATORY_CARE_PROVIDER_SITE_OTHER): Payer: Medicare Other | Admitting: Psychiatry

## 2014-10-12 ENCOUNTER — Other Ambulatory Visit (INDEPENDENT_AMBULATORY_CARE_PROVIDER_SITE_OTHER): Payer: Self-pay | Admitting: Surgery

## 2014-10-17 ENCOUNTER — Ambulatory Visit (HOSPITAL_COMMUNITY)
Admission: RE | Admit: 2014-10-17 | Discharge: 2014-10-17 | Disposition: A | Discharge status: Home / Self Care | Payer: Medicare Other | Source: Ambulatory Visit | Attending: Surgery | Admitting: Surgery

## 2014-10-22 ENCOUNTER — Ambulatory Visit: Payer: Self-pay

## 2014-10-24 ENCOUNTER — Other Ambulatory Visit: Payer: Self-pay | Admitting: Surgery

## 2014-10-24 MED ORDER — DEXTROSE 5 % IV SOLN: 3.0000 g | INTRAVENOUS | Status: DC

## 2014-10-24 MED ORDER — CHLORHEXIDINE GLUCONATE 4 % EX LIQD: 60.0000 mL | Freq: Once | CUTANEOUS | Status: DC

## 2014-10-24 MED ORDER — HEPARIN SODIUM (PORCINE) 5000 UNIT/ML IJ SOLN: 5000.0000 [IU] | INTRAMUSCULAR | Status: DC

## 2014-10-29 ENCOUNTER — Encounter: Payer: Medicare Other | Attending: Surgery

## 2014-10-29 DIAGNOSIS — Z713 Dietary counseling and surveillance: Secondary | ICD-10-CM | POA: Insufficient documentation

## 2014-10-29 DIAGNOSIS — Z6841 Body Mass Index (BMI) 40.0 and over, adult: Secondary | ICD-10-CM | POA: Diagnosis not present

## 2014-10-30 ENCOUNTER — Encounter (HOSPITAL_COMMUNITY): Payer: Self-pay | Admitting: *Deleted

## 2014-10-30 NOTE — Progress Notes (Signed)
  Pre-Operative Nutrition Class:  Appt start time: 1610   End time:  1830.  Patient was seen on 10/29/14 for Pre-Operative Bariatric Surgery Education at the [facility].   Surgery date:  Surgery type: LAGB Start weight at [facility]:  Weight today: 294.5 lbs  TANITA  BODY COMP RESULTS  10/29/14   BMI (kg/m^2) 49   Fat Mass (lbs) 214.5   Fat Free Mass (lbs) 80   Total Body Water (lbs) 58.5   Samples given per MNT protocol. Patient educated on appropriate usage: Unjury protein powder (strawberry - qty 1) Lot #: [id number] Exp: 09/2015  Premier protein shake (chocolate - qty 1) Lot #: [id number] Exp: 06/2015  PB2 (qty 1) Lot #: [id number] Exp: 03/2015  Bariatric Advantage calcium citrate chew (orange - qty 1) Lot #: [id number] Exp: 12/2014  The following the learning objectives were met by the patient during this course:  Identify Pre-Op Dietary Goals and will begin 2 Boch[place]chio pre-operatively  Identify appropriate sources of fluids and proteins   State protein recommendations and appropriate sources pre and post-operatively  Identify Post-Operative Dietary Goals and will follow for 2 Vegh post-operatively  Identify appropriate multivitamin and calcium sources  Describe the need for physical activity post-operatively and will follow MD recommendations  State when to call healthcare provider regarding medication questions or post-operative complications  Handouts given during class include:  Pre-Op Bariatric Surgery Diet Handout  Protein Shake Handout  Post-Op Bariatric Surgery Nutrition Handout  BELT Program Information Flyer  Support Group Information Flyer  [facility] Bariatric Supplements Price List  Follow-Up Plan: Patient will follow-up at [facility] 2 [place] post operatively for diet advancement per MD.

## 2014-11-02 HISTORY — PX: HERNIA REPAIR: SHX51

## 2014-11-06 ENCOUNTER — Other Ambulatory Visit: Payer: Self-pay | Admitting: Surgery

## 2014-11-06 ENCOUNTER — Encounter (HOSPITAL_COMMUNITY): Payer: Self-pay | Admitting: *Deleted

## 2014-11-06 NOTE — Progress Notes (Signed)
Please put orders in Epic under sign and held for Same day surgery 11-12-14 Thanks

## 2014-11-07 ENCOUNTER — Ambulatory Visit: Payer: Self-pay | Admitting: Dietician

## 2014-11-07 ENCOUNTER — Encounter: Payer: Medicare Other | Attending: Surgery | Admitting: Dietician

## 2014-11-07 ENCOUNTER — Encounter (HOSPITAL_COMMUNITY): Payer: Self-pay | Admitting: *Deleted

## 2014-11-07 DIAGNOSIS — Z713 Dietary counseling and surveillance: Secondary | ICD-10-CM | POA: Insufficient documentation

## 2014-11-07 DIAGNOSIS — Z6841 Body Mass Index (BMI) 40.0 and over, adult: Secondary | ICD-10-CM | POA: Diagnosis not present

## 2014-11-07 NOTE — Progress Notes (Signed)
  Pre-Op Assessment Visit:  Pre-Operative LAGB Surgery  Medical Nutrition Therapy:  Appt start time: 0845   End time:  0930.  Patient was seen on 11/07/14 for Pre-Operative Nutrition Assessment. Assessment and letter of approval faxed to [facility] Bariatric Surgery Program coordinator on 11/07/14.   Preferred Learning Style:   No preference indicated   Learning Readiness:   Contemplating  Ready  Handouts given during visit include:  Pre-Op Goals Bariatric Surgery Protein Shakes   During the appointment today the following Pre-Op Goals were reviewed with the patient: Maintain or lose weight as instructed by your surgeon Make healthy food choices Begin to limit portion sizes Limited concentrated sugars and fried foods Keep fat/sugar in the single digits per serving on   food labels Practice CHEWING your food  (aim for 30 chews per bite or until applesauce consistency) Practice not drinking 15 minutes before, during, and 30 minutes after each meal/snack Avoid all carbonated beverages  Avoid/limit caffeinated beverages  Avoid all sugar-sweetened beverages Consume 3 meals per day; eat every 3-5 hours Make a list of non-food related activities Aim for 64-100 ounces of FLUID daily  Aim for at least 60-80 grams of PROTEIN daily Look for a liquid protein source that contain ?15 g protein and ?5 g carbohydrate  (ex: shakes, drinks, shots)  Demonstrated degree of understanding via:  Teach Back  Teaching Method Utilized:  Visual Auditory Hands on  Barriers to learning/adherence to lifestyle change: dialysis and 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

## 2014-11-11 MED ORDER — DEXTROSE 5 % IV SOLN
3.0000 g | INTRAVENOUS | Status: AC
  Administered 2014-11-12: 3 g via INTRAVENOUS
  Filled 2014-11-11: qty 3000

## 2014-11-11 NOTE — H&P (Signed)
[name] 10/25/2014 10:23 AM Location: [facility] Patient #: [id number] DOB: 03/28/1986 Single / Language: [name] / Race: Black or African American Male  History of Present Illness  Patient words: preop for lap band  The patient is a 29 year old male who presents for a bariatric surgery evaluation. His PCP is Dr. Revonda Humphrey. He sees Dr. Lenna Sciara. [place] for renal issues. He came by himself.  He is on for lap band for 11/12/2014. This is a Monday. He plans to have dialysis that morning - then his surgery later that day. We talked about the different operations. He asked about the mini gastric bypass. He also said that he had not heard of the sleeve. But he had been to an information session and I talked about all the possible options when I last saw him. He also talked about transplant - I think that any of the operations that help him lose weight would help make transplant surgery safer.  I also talked to him about gaining close to 10 pounds since I last saw him. He does not have much of an answer for this.  UGI - 10/17/2014 - some reflux, no HH Abd [place] - 05/03/2014 - 1. Bilateral echogenic kidneys compatible with end-stage renal disease. 2. Renal cysts. Psych - [name] - okayed for surgery - 10/04/2014  Morbid obesity Weight - 291, BMI 47.07 History of weight loss problems: The patient saw me in 2009 to consider weight loss surgery (lap band). But at that time he was not prepared to go through with the surgery. He has seen continued complications of being overweight and now is reconsidering surgery. He has tried diets Slim Fast and using the green Tea diet. He has some success with Slim fast where he lost about 30 pounds, but because of his renal failure there is some concern about phosphorus and electrolytes.  Per the [facility], the patient is a candidate for bariatric surgery. The patient attended our information  session and reviewed the different types of bariatric surgery.  The patient is interested in the laparoscopic adjustable gastric band. I discussed with the patient the indications and risks of lap band surgery. The potential risks of surgery include, but are not limited to, bleeding, infection, DVT and PE, slippage and erosion of the band, open surgery, and death. The patient understands the importance of compliance and long term follow-up with our group after surgery.  Past Medical History: 2. ESRD - Dr. Lenna Sciara. [place] Dialysis is MWF 3. HTN 4. GERD 5. 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 Gerkin 6. Arthritis right knee - Dr. Marlou Sa. 7 On disability since 2004, but he says that he is looking for work.  SOCIAL and FAMILY HISTORY: Unmarried. Has no children. Lives with mother. On disability since 2004, though he says he may start looking for a job   Other Problems Aleatha Borer, LPN; 579FGE [id number] AM) Chronic Renal Failure Syndrome  Past Surgical History (Ammie Eversole, LPN; 579FGE X33443 AM) Dialysis Shunt / Fistula Knee Surgery Right. Lap Band Thyroid Surgery  Diagnostic Studies History (Ammie Eversole, LPN; 579FGE [id number] AM) Colonoscopy never  Allergies (Ammie Eversole, LPN; 579FGE 579FGE AM) No Known Drug Allergies03/24/2016  Medication History (Ammie Eversole, LPN; 579FGE QA348G AM) Aspirin EC (325MG  Tablet DR, Oral) Active. Calcium Acetate (667MG  Capsule, Oral) Active. Tums 500 (1250MG  Tablet Chewable, Oral) Active. Docusate Sodium (100MG  Tablet, Oral) Active. Robaxin (500MG  Tablet, Oral) Active. PriLOSEC (20MG  Capsule DR, Oral) Active.  Social History (Ammie  Eversole, LPN; 579FGE X33443 AM) Alcohol use Remotely quit alcohol use. Caffeine use Coffee, Tea. No drug use Tobacco use Current some day smoker.  Family History Aleatha Borer, LPN; 579FGE [id number] AM) Arthritis Father, Mother, Sister. Hypertension  Mother.  Review of Systems (Ammie Eversole LPN; 579FGE X33443 AM) General Present- Weight Gain. Not Present- Appetite Loss, Chills, Fatigue, Fever, Night Sweats and Weight Loss. Skin Not Present- Change in Wart/Mole, Dryness, Hives, Jaundice, New Lesions, Non-Healing Wounds, Rash and Ulcer. HEENT Not Present- Earache, Hearing Loss, Hoarseness, Nose Bleed, Oral Ulcers, Ringing in the Ears, Seasonal Allergies, Sinus Pain, Sore Throat, Visual Disturbances, Wears glasses/contact lenses and Yellow Eyes. Respiratory Not Present- Bloody sputum, Chronic Cough, Difficulty Breathing, Snoring and Wheezing. Breast Not Present- Breast Mass, Breast Pain, Nipple Discharge and Skin Changes. Cardiovascular Not Present- Chest Pain, Difficulty Breathing Lying Down, Leg Cramps, Palpitations, Rapid Heart Rate, Shortness of Breath and Swelling of Extremities. Gastrointestinal Not Present- Abdominal Pain, Bloating, Bloody Stool, Change in Bowel Habits, Chronic diarrhea, Constipation, Difficulty Swallowing, Excessive gas, Gets full quickly at meals, Hemorrhoids, Indigestion, Nausea, Rectal Pain and Vomiting. Male Genitourinary Not Present- Blood in Urine, Change in Urinary Stream, Frequency, Impotence, Nocturia, Painful Urination, Urgency and Urine Leakage. Musculoskeletal Present- Joint Pain and Joint Stiffness. Not Present- Back Pain, Muscle Pain, Muscle Weakness and Swelling of Extremities. Neurological Present- Trouble walking. Not Present- Decreased Memory, Fainting, Headaches, Numbness, Seizures, Tingling, Tremor and Weakness. Psychiatric Not Present- Anxiety, Bipolar, Change in Sleep Pattern, Depression, Fearful and Frequent crying. Endocrine Not Present- Cold Intolerance, Excessive Hunger, Hair Changes, Heat Intolerance, Hot flashes and New Diabetes. Hematology Not Present- Easy Bruising, Excessive bleeding, Gland problems, HIV and Persistent Infections.   Vitals (Ammie Eversole LPN; 579FGE 579FGE  AM) 10/25/2014 10:24 AM Weight: 298.2 lb Height: 65.75in Body Surface Area: 2.5 m Body Mass Index: 48.5 kg/m Temp.: 98.92F(Oral)  Pulse: 64 (Regular)  BP: 80/42 (Sitting, Left Arm, Standard)  Physical Exam  General: Obese AA M who is alert and generally healthy appearing.  HEENT: Normal. Pupils equal.  Neck: Supple. No mass. No thyroid mass. Lymph Nodes: No supraclavicular or cervical nodes.  Lungs: Clear to auscultation and symmetric breath sounds. Heart: RRR. No murmur or rub.  Abdomen: Soft. No mass. No tenderness. No hernia. Normal bowel sounds. No abdominal scars. He is more pear than apple, but has a lot of mid adbominal fat.  Extremities: Good strength. Has a bandage over his shunt in his left arm. He also has a shunt in the right arm. Neurologic: Grossly intact to motor and sensory function.  Psychiatric: Has normal mood and affect. Behavior is normal.  Assessment & Plan 1.  MORBID OBESITY WITH BMI OF 45.0-49.9, ADULT (278.01  E66.01) Impression: For Lap Band 11/12/2014  2.  END-STAGE RENAL DISEASE ON HEMODIALYSIS (585.6  N18.6) Impression: Followed by Dr. Lenna Sciara. [place].  Gets his hemodialysis - MWF   Plans hemodialysis the AM before surgery  Alphonsa Overall, MD, [facility] Pager: [phone] Office phone:  [phone]

## 2014-11-12 ENCOUNTER — Inpatient Hospital Stay (HOSPITAL_COMMUNITY): Payer: Medicare Other | Admitting: Certified Registered Nurse Anesthetist

## 2014-11-12 ENCOUNTER — Encounter (HOSPITAL_COMMUNITY): Payer: Self-pay | Admitting: *Deleted

## 2014-11-12 ENCOUNTER — Encounter (HOSPITAL_COMMUNITY)
Admission: RE | Disposition: A | Discharge status: Home / Self Care | Payer: Self-pay | Source: Ambulatory Visit | Attending: Surgery

## 2014-11-12 ENCOUNTER — Observation Stay (HOSPITAL_COMMUNITY)
Admission: RE | Admit: 2014-11-12 | Discharge: 2014-11-14 | Disposition: A | Discharge status: Home / Self Care | Payer: Medicare Other | Source: Ambulatory Visit | Attending: Surgery | Admitting: Surgery

## 2014-11-12 ENCOUNTER — Observation Stay (HOSPITAL_COMMUNITY): Payer: Medicare Other

## 2014-11-12 DIAGNOSIS — Z992 Dependence on renal dialysis: Secondary | ICD-10-CM | POA: Insufficient documentation

## 2014-11-12 DIAGNOSIS — N186 End stage renal disease: Secondary | ICD-10-CM | POA: Insufficient documentation

## 2014-11-12 DIAGNOSIS — F17200 Nicotine dependence, unspecified, uncomplicated: Secondary | ICD-10-CM | POA: Diagnosis not present

## 2014-11-12 DIAGNOSIS — Z79899 Other long term (current) drug therapy: Secondary | ICD-10-CM | POA: Insufficient documentation

## 2014-11-12 DIAGNOSIS — Z7982 Long term (current) use of aspirin: Secondary | ICD-10-CM | POA: Insufficient documentation

## 2014-11-12 DIAGNOSIS — M1990 Unspecified osteoarthritis, unspecified site: Secondary | ICD-10-CM | POA: Insufficient documentation

## 2014-11-12 DIAGNOSIS — K219 Gastro-esophageal reflux disease without esophagitis: Secondary | ICD-10-CM | POA: Insufficient documentation

## 2014-11-12 DIAGNOSIS — Z9884 Bariatric surgery status: Secondary | ICD-10-CM

## 2014-11-12 DIAGNOSIS — Z6841 Body Mass Index (BMI) 40.0 and over, adult: Secondary | ICD-10-CM | POA: Diagnosis not present

## 2014-11-12 DIAGNOSIS — R51 Headache: Secondary | ICD-10-CM | POA: Insufficient documentation

## 2014-11-12 DIAGNOSIS — I120 Hypertensive chronic kidney disease with stage 5 chronic kidney disease or end stage renal disease: Secondary | ICD-10-CM | POA: Diagnosis not present

## 2014-11-12 DIAGNOSIS — K449 Diaphragmatic hernia without obstruction or gangrene: Secondary | ICD-10-CM | POA: Diagnosis not present

## 2014-11-12 HISTORY — PX: LAPAROSCOPIC GASTRIC BANDING: SHX1100

## 2014-11-12 LAB — COMPREHENSIVE METABOLIC PANEL
ALT: 18 U/L (ref 0–53)
AST: 13 U/L (ref 0–37)
Albumin: 4.1 g/dL (ref 3.5–5.2)
Alkaline Phosphatase: 44 U/L (ref 39–117)
Anion gap: 16 — ABNORMAL HIGH (ref 5–15)
BUN: 64 mg/dL — ABNORMAL HIGH (ref 6–23)
CO2: 22 mmol/L (ref 19–32)
Calcium: 7.9 mg/dL — ABNORMAL LOW (ref 8.4–10.5)
Chloride: 96 mmol/L (ref 96–112)
Creatinine, Ser: 11.19 mg/dL — ABNORMAL HIGH (ref 0.50–1.35)
GFR, EST AFRICAN AMERICAN: 6 mL/min — AB (ref >90)
GFR, EST NON AFRICAN AMERICAN: 5 mL/min — AB (ref >90)
GLUCOSE: 89 mg/dL (ref 70–99)
Potassium: 3.8 mmol/L (ref 3.5–5.1)
SODIUM: 134 mmol/L — AB (ref 135–145)
TOTAL PROTEIN: 7.7 g/dL (ref 6.0–8.3)
Total Bilirubin: 0.6 mg/dL (ref 0.3–1.2)

## 2014-11-12 LAB — HEMOGLOBIN AND HEMATOCRIT, BLOOD
HEMATOCRIT: 37.4 % — AB (ref 39.0–52.0)
HEMOGLOBIN: 12.6 g/dL — AB (ref 13.0–17.0)

## 2014-11-12 LAB — CBC WITH DIFFERENTIAL/PLATELET
Basophils Absolute: 0.1 10*3/uL (ref 0.0–0.1)
Basophils Relative: 0 % (ref 0–1)
EOS ABS: 0.3 10*3/uL (ref 0.0–0.7)
EOS PCT: 3 % (ref 0–5)
HEMATOCRIT: 36.1 % — AB (ref 39.0–52.0)
Hemoglobin: 12 g/dL — ABNORMAL LOW (ref 13.0–17.0)
LYMPHS ABS: 4 10*3/uL (ref 0.7–4.0)
Lymphocytes Relative: 35 % (ref 12–46)
MCH: 31.6 pg (ref 26.0–34.0)
MCHC: 33.2 g/dL (ref 30.0–36.0)
MCV: 95 fL (ref 78.0–100.0)
MONO ABS: 1.2 10*3/uL — AB (ref 0.1–1.0)
MONOS PCT: 10 % (ref 3–12)
Neutro Abs: 6 10*3/uL (ref 1.7–7.7)
Neutrophils Relative %: 52 % (ref 43–77)
PLATELETS: 171 10*3/uL (ref 150–400)
RBC: 3.8 MIL/uL — AB (ref 4.22–5.81)
RDW: 14.1 % (ref 11.5–15.5)
WBC: 11.5 10*3/uL — ABNORMAL HIGH (ref 4.0–10.5)

## 2014-11-12 SURGERY — GASTRIC BANDING, LAPAROSCOPIC
Anesthesia: General | Site: Abdomen

## 2014-11-12 MED ORDER — HYDROMORPHONE HCL 1 MG/ML IJ SOLN
0.2500 mg | INTRAMUSCULAR | Status: DC | PRN
  Administered 2014-11-12: 0.5 mg via INTRAVENOUS
  Administered 2014-11-12: 0.25 mg via INTRAVENOUS
  Administered 2014-11-12: 0.5 mg via INTRAVENOUS
  Administered 2014-11-12 (×3): 0.25 mg via INTRAVENOUS

## 2014-11-12 MED ORDER — CISATRACURIUM BESYLATE 20 MG/10ML IV SOLN
INTRAVENOUS | Status: AC
  Filled 2014-11-12: qty 10

## 2014-11-12 MED ORDER — UNJURY CHICKEN SOUP POWDER
2.0000 [oz_av] | Freq: Four times a day (QID) | ORAL | Status: DC
  Administered 2014-11-13 – 2014-11-14 (×3): 2 [oz_av] via ORAL

## 2014-11-12 MED ORDER — ACETAMINOPHEN 10 MG/ML IV SOLN
1000.0000 mg | Freq: Once | INTRAVENOUS | Status: AC
  Administered 2014-11-12: 1000 mg via INTRAVENOUS
  Filled 2014-11-12: qty 100

## 2014-11-12 MED ORDER — GLYCOPYRROLATE 0.2 MG/ML IJ SOLN
INTRAMUSCULAR | Status: AC
  Filled 2014-11-12: qty 3

## 2014-11-12 MED ORDER — MIDAZOLAM HCL 5 MG/5ML IJ SOLN
INTRAMUSCULAR | Status: DC | PRN
  Administered 2014-11-12: 1 mg via INTRAVENOUS

## 2014-11-12 MED ORDER — BUPIVACAINE HCL (PF) 0.25 % IJ SOLN
INTRAMUSCULAR | Status: DC | PRN
  Administered 2014-11-12: 30 mL

## 2014-11-12 MED ORDER — LIDOCAINE HCL (CARDIAC) 20 MG/ML IV SOLN
INTRAVENOUS | Status: DC | PRN
  Administered 2014-11-12: 60 mg via INTRAVENOUS

## 2014-11-12 MED ORDER — NEOSTIGMINE METHYLSULFATE 10 MG/10ML IV SOLN
INTRAVENOUS | Status: DC | PRN
  Administered 2014-11-12: 5 mg via INTRAVENOUS

## 2014-11-12 MED ORDER — PROPOFOL 10 MG/ML IV BOLUS
INTRAVENOUS | Status: DC | PRN
  Administered 2014-11-12: 200 mg via INTRAVENOUS
  Administered 2014-11-12: 30 mg via INTRAVENOUS

## 2014-11-12 MED ORDER — PROMETHAZINE HCL 25 MG/ML IJ SOLN: 6.2500 mg | INTRAMUSCULAR | Status: DC | PRN

## 2014-11-12 MED ORDER — GLYCOPYRROLATE 0.2 MG/ML IJ SOLN
INTRAMUSCULAR | Status: AC
  Filled 2014-11-12: qty 4

## 2014-11-12 MED ORDER — HEPARIN SODIUM (PORCINE) 5000 UNIT/ML IJ SOLN
5000.0000 [IU] | INTRAMUSCULAR | Status: AC
  Administered 2014-11-12: 5000 [IU] via SUBCUTANEOUS
  Filled 2014-11-12: qty 1

## 2014-11-12 MED ORDER — HYDROMORPHONE HCL 1 MG/ML IJ SOLN
INTRAMUSCULAR | Status: AC
  Filled 2014-11-12: qty 1

## 2014-11-12 MED ORDER — ACETAMINOPHEN 160 MG/5ML PO SOLN
650.0000 mg | ORAL | Status: DC | PRN
  Administered 2014-11-13 – 2014-11-14 (×5): 650 mg via ORAL
  Filled 2014-11-12 (×5): qty 20.3

## 2014-11-12 MED ORDER — ROCURONIUM BROMIDE 100 MG/10ML IV SOLN
INTRAVENOUS | Status: AC
  Filled 2014-11-12: qty 1

## 2014-11-12 MED ORDER — FENTANYL CITRATE 0.05 MG/ML IJ SOLN
INTRAMUSCULAR | Status: DC | PRN
  Administered 2014-11-12 (×7): 50 ug via INTRAVENOUS

## 2014-11-12 MED ORDER — DEXAMETHASONE SODIUM PHOSPHATE 10 MG/ML IJ SOLN
INTRAMUSCULAR | Status: AC
  Filled 2014-11-12: qty 1

## 2014-11-12 MED ORDER — 0.9 % SODIUM CHLORIDE (POUR BTL) OPTIME
TOPICAL | Status: DC | PRN
Start: 2014-11-12 — End: 2014-11-12
  Administered 2014-11-12: 1000 mL

## 2014-11-12 MED ORDER — MEPERIDINE HCL 50 MG/ML IJ SOLN: 6.2500 mg | INTRAMUSCULAR | Status: DC | PRN

## 2014-11-12 MED ORDER — ONDANSETRON HCL 4 MG/2ML IJ SOLN
4.0000 mg | INTRAMUSCULAR | Status: DC | PRN
  Administered 2014-11-12 – 2014-11-13 (×3): 4 mg via INTRAVENOUS
  Filled 2014-11-12 (×3): qty 2

## 2014-11-12 MED ORDER — ONDANSETRON HCL 4 MG/2ML IJ SOLN
INTRAMUSCULAR | Status: AC
  Filled 2014-11-12: qty 2

## 2014-11-12 MED ORDER — NEOSTIGMINE METHYLSULFATE 10 MG/10ML IV SOLN
INTRAVENOUS | Status: AC
  Filled 2014-11-12: qty 1

## 2014-11-12 MED ORDER — SUCCINYLCHOLINE CHLORIDE 20 MG/ML IJ SOLN
INTRAMUSCULAR | Status: DC | PRN
  Administered 2014-11-12: 130 mg via INTRAVENOUS

## 2014-11-12 MED ORDER — DEXAMETHASONE SODIUM PHOSPHATE 10 MG/ML IJ SOLN
INTRAMUSCULAR | Status: DC | PRN
  Administered 2014-11-12: 10 mg via INTRAVENOUS

## 2014-11-12 MED ORDER — HEPARIN SODIUM (PORCINE) 5000 UNIT/ML IJ SOLN
5000.0000 [IU] | Freq: Three times a day (TID) | INTRAMUSCULAR | Status: DC
  Administered 2014-11-12 – 2014-11-14 (×5): 5000 [IU] via SUBCUTANEOUS
  Filled 2014-11-12 (×8): qty 1

## 2014-11-12 MED ORDER — PHENYLEPHRINE HCL 10 MG/ML IJ SOLN
INTRAMUSCULAR | Status: DC | PRN
  Administered 2014-11-12: 80 ug via INTRAVENOUS
  Administered 2014-11-12: 120 ug via INTRAVENOUS

## 2014-11-12 MED ORDER — EPHEDRINE SULFATE 50 MG/ML IJ SOLN
INTRAMUSCULAR | Status: DC | PRN
  Administered 2014-11-12: 15 mg via INTRAVENOUS
  Administered 2014-11-12: 5 mg via INTRAVENOUS

## 2014-11-12 MED ORDER — GLYCOPYRROLATE 0.2 MG/ML IJ SOLN
INTRAMUSCULAR | Status: DC | PRN
  Administered 2014-11-12: .8 mg via INTRAVENOUS

## 2014-11-12 MED ORDER — OXYCODONE HCL 5 MG/5ML PO SOLN
5.0000 mg | ORAL | Status: DC | PRN
  Administered 2014-11-13 – 2014-11-14 (×6): 10 mg via ORAL
  Filled 2014-11-12 (×6): qty 10

## 2014-11-12 MED ORDER — CISATRACURIUM BESYLATE (PF) 10 MG/5ML IV SOLN
INTRAVENOUS | Status: DC | PRN
  Administered 2014-11-12: 10 mg via INTRAVENOUS

## 2014-11-12 MED ORDER — SODIUM CHLORIDE 0.9 % IV SOLN
INTRAVENOUS | Status: DC
  Administered 2014-11-12: 1000 mL via INTRAVENOUS

## 2014-11-12 MED ORDER — GLYCOPYRROLATE 0.2 MG/ML IJ SOLN
INTRAMUSCULAR | Status: AC
  Filled 2014-11-12: qty 1

## 2014-11-12 MED ORDER — FENTANYL CITRATE 0.05 MG/ML IJ SOLN
INTRAMUSCULAR | Status: AC
  Filled 2014-11-12: qty 5

## 2014-11-12 MED ORDER — UNJURY CHOCOLATE CLASSIC POWDER: 2.0000 [oz_av] | Freq: Four times a day (QID) | ORAL | Status: DC

## 2014-11-12 MED ORDER — LIDOCAINE HCL (CARDIAC) 20 MG/ML IV SOLN
INTRAVENOUS | Status: AC
  Filled 2014-11-12: qty 5

## 2014-11-12 MED ORDER — FENTANYL CITRATE 0.05 MG/ML IJ SOLN
INTRAMUSCULAR | Status: AC
  Filled 2014-11-12: qty 2

## 2014-11-12 MED ORDER — PROPOFOL 10 MG/ML IV BOLUS
INTRAVENOUS | Status: AC
  Filled 2014-11-12: qty 20

## 2014-11-12 MED ORDER — CITRIC ACID-SODIUM CITRATE 334-500 MG/5ML PO SOLN
ORAL | Status: AC
  Filled 2014-11-12: qty 15

## 2014-11-12 MED ORDER — SODIUM CHLORIDE 0.9 % IJ SOLN
INTRAMUSCULAR | Status: AC
  Filled 2014-11-12: qty 20

## 2014-11-12 MED ORDER — KETOROLAC TROMETHAMINE 30 MG/ML IJ SOLN: 30.0000 mg | Freq: Once | INTRAMUSCULAR | Status: DC | PRN

## 2014-11-12 MED ORDER — SODIUM CHLORIDE 0.9 % IJ SOLN
INTRAMUSCULAR | Status: DC | PRN
  Administered 2014-11-12: 20 mL

## 2014-11-12 MED ORDER — CITRIC ACID-SODIUM CITRATE 334-500 MG/5ML PO SOLN
ORAL | Status: DC | PRN
  Administered 2014-11-12: 15 mL via ORAL

## 2014-11-12 MED ORDER — UNJURY VANILLA POWDER
2.0000 [oz_av] | Freq: Four times a day (QID) | ORAL | Status: DC
  Administered 2014-11-13 (×2): 2 [oz_av] via ORAL

## 2014-11-12 MED ORDER — BUPIVACAINE HCL (PF) 0.25 % IJ SOLN
INTRAMUSCULAR | Status: AC
  Filled 2014-11-12: qty 30

## 2014-11-12 MED ORDER — LACTATED RINGERS IR SOLN
Status: DC | PRN
  Administered 2014-11-12: 1

## 2014-11-12 MED ORDER — FAMOTIDINE IN NACL 20-0.9 MG/50ML-% IV SOLN
20.0000 mg | Freq: Once | INTRAVENOUS | Status: AC
  Administered 2014-11-12: 20 mg via INTRAVENOUS
  Filled 2014-11-12: qty 50

## 2014-11-12 MED ORDER — MORPHINE SULFATE 2 MG/ML IJ SOLN
2.0000 mg | INTRAMUSCULAR | Status: DC | PRN
  Administered 2014-11-12 – 2014-11-13 (×4): 4 mg via INTRAVENOUS
  Filled 2014-11-12 (×2): qty 2
  Filled 2014-11-12: qty 3
  Filled 2014-11-12 (×2): qty 2

## 2014-11-12 MED ORDER — ONDANSETRON HCL 4 MG/2ML IJ SOLN
INTRAMUSCULAR | Status: DC | PRN
  Administered 2014-11-12: 4 mg via INTRAVENOUS

## 2014-11-12 MED ORDER — MIDAZOLAM HCL 2 MG/2ML IJ SOLN
INTRAMUSCULAR | Status: AC
  Filled 2014-11-12: qty 2

## 2014-11-12 MED ORDER — ACETAMINOPHEN 160 MG/5ML PO SOLN: 325.0000 mg | ORAL | Status: DC | PRN

## 2014-11-12 MED ORDER — FENTANYL CITRATE 0.05 MG/ML IJ SOLN
25.0000 ug | INTRAMUSCULAR | Status: DC | PRN
  Administered 2014-11-12 (×4): 25 ug via INTRAVENOUS

## 2014-11-12 SURGICAL SUPPLY — 52 items
BAND LAP 10.0 W/TUBES (Band) ×2 IMPLANT
BENZOIN TINCTURE PRP APPL 2/3 (GAUZE/BANDAGES/DRESSINGS) IMPLANT
BLADE HEX COATED 2.75 (ELECTRODE) ×2 IMPLANT
BLADE SURG 15 STRL LF DISP TIS (BLADE) ×1 IMPLANT
BLADE SURG 15 STRL SS (BLADE) ×1
CHLORAPREP W/TINT 26ML (MISCELLANEOUS) ×4 IMPLANT
DECANTER SPIKE VIAL GLASS SM (MISCELLANEOUS) ×2 IMPLANT
DEVICE SUT QUICK LOAD TK 5 (STAPLE) ×6 IMPLANT
DEVICE SUT TI-KNOT TK 5X26 (MISCELLANEOUS) ×2 IMPLANT
DEVICE SUTURE ENDOST 10MM (ENDOMECHANICALS) IMPLANT
DISSECTOR BLUNT TIP ENDO 5MM (MISCELLANEOUS) IMPLANT
DRAPE CAMERA CLOSED 9X96 (DRAPES) IMPLANT
DRAPE UTILITY XL STRL (DRAPES) ×2 IMPLANT
ELECT REM PT RETURN 9FT ADLT (ELECTROSURGICAL) ×2
ELECTRODE REM PT RTRN 9FT ADLT (ELECTROSURGICAL) ×1 IMPLANT
GLOVE SURG SIGNA 7.5 PF LTX (GLOVE) ×2 IMPLANT
GOWN SPEC L4 XLG W/TWL (GOWN DISPOSABLE) ×2 IMPLANT
GOWN STRL REUS W/TWL XL LVL3 (GOWN DISPOSABLE) ×6 IMPLANT
HOVERMATT SINGLE USE (MISCELLANEOUS) ×2 IMPLANT
KIT BASIN OR (CUSTOM PROCEDURE TRAY) ×2 IMPLANT
LIQUID BAND (GAUZE/BANDAGES/DRESSINGS) ×2 IMPLANT
MESH HERNIA 1X4 RECT BARD (Mesh General) ×1 IMPLANT
MESH HERNIA BARD 1X4 (Mesh General) ×1 IMPLANT
NEEDLE SPNL 22GX3.5 QUINCKE BK (NEEDLE) ×2 IMPLANT
NS IRRIG 1000ML POUR BTL (IV SOLUTION) ×2 IMPLANT
PACK UNIVERSAL I (CUSTOM PROCEDURE TRAY) ×2 IMPLANT
PENCIL BUTTON HOLSTER BLD 10FT (ELECTRODE) ×2 IMPLANT
SET IRRIG TUBING LAPAROSCOPIC (IRRIGATION / IRRIGATOR) IMPLANT
SHEARS HARMONIC ACE PLUS 36CM (ENDOMECHANICALS) IMPLANT
SLEEVE XCEL OPT CAN 5 100 (ENDOMECHANICALS) ×6 IMPLANT
SOLUTION ANTI FOG 6CC (MISCELLANEOUS) ×2 IMPLANT
SPONGE LAP 18X18 X RAY DECT (DISPOSABLE) ×2 IMPLANT
STAPLER VISISTAT 35W (STAPLE) ×2 IMPLANT
STRIP CLOSURE SKIN 1/2X4 (GAUZE/BANDAGES/DRESSINGS) IMPLANT
SUT ETHIBOND 2 0 SH (SUTURE) ×6
SUT ETHIBOND 2 0 SH 36X2 (SUTURE) ×6 IMPLANT
SUT MNCRL AB 4-0 PS2 18 (SUTURE) ×4 IMPLANT
SUT PROLENE 2 0 CT2 30 (SUTURE) ×2 IMPLANT
SUT SILK 0 (SUTURE) ×1
SUT SILK 0 30XBRD TIE 6 (SUTURE) ×1 IMPLANT
SUT SURGIDAC NAB ES-9 0 48 120 (SUTURE) IMPLANT
SUT VIC AB 2-0 SH 27 (SUTURE) ×1
SUT VIC AB 2-0 SH 27X BRD (SUTURE) ×1 IMPLANT
SUT VIC AB 5-0 PS2 18 (SUTURE) ×2 IMPLANT
SYR 20CC LL (SYRINGE) ×4 IMPLANT
TOWEL OR 17X26 10 PK STRL BLUE (TOWEL DISPOSABLE) ×2 IMPLANT
TOWEL OR NON WOVEN STRL DISP B (DISPOSABLE) ×2 IMPLANT
TROCAR BLADELESS 15MM (ENDOMECHANICALS) ×2 IMPLANT
TROCAR BLADELESS OPT 5 100 (ENDOMECHANICALS) ×2 IMPLANT
TROCAR XCEL NON-BLD 11X100MML (ENDOMECHANICALS) ×2 IMPLANT
TUBE CALIBRATION LAPBAND (TUBING) ×2 IMPLANT
TUBING INSUFFLATION 10FT LAP (TUBING) ×2 IMPLANT

## 2014-11-12 NOTE — Interval H&P Note (Signed)
History and Physical Interval Note:  11/12/2014 11:45 AM  [name]  has presented today for surgery, with the diagnosis of Morbid Obesity  The various methods of treatment have been discussed with the patient and family.  Sister Ahsir Sima is here with patient.  He had dialysis on Saturday.  His next dialysis is Wednesday, 4/13.  After consideration of risks, benefits and other options for treatment, the patient has consented to  Procedure(s): LAPAROSCOPIC GASTRIC BANDING (N/A) as a surgical intervention .  The patient's history has been reviewed, patient examined, no change in status, stable for surgery.  I have reviewed the patient's chart and labs.  Questions were answered to the patient's satisfaction.     Christiann Hagerty H

## 2014-11-12 NOTE — Transfer of Care (Signed)
Immediate Anesthesia Transfer of Care Note  Patient: [name] Doctor  Procedure(s) Performed: Procedure(s) (LRB): LAPAROSCOPIC GASTRIC BANDING (N/A)  Patient Location: PACU  Anesthesia Type: General  Level of Consciousness: sedated, patient cooperative and responds to stimulation  Airway & Oxygen Therapy: Patient Spontanous Breathing and Patient connected to face mask oxgen  Post-op Assessment: Report given to PACU RN and Post -op Vital signs reviewed and stable  Post vital signs: Reviewed and stable  Complications: No apparent anesthesia complications

## 2014-11-12 NOTE — Anesthesia Procedure Notes (Signed)
Procedure Name: Intubation Date/Time: 11/12/2014 12:03 PM Performed by: Montel Clock Pre-anesthesia Checklist: Patient identified, Emergency Drugs available, Suction available, Patient being monitored and Timeout performed Patient Re-evaluated:Patient Re-evaluated prior to inductionOxygen Delivery Method: Circle system utilized Preoxygenation: Pre-oxygenation with 100% oxygen Intubation Type: IV induction Ventilation: Mask ventilation without difficulty Laryngoscope Size: Mac and 3 Grade View: Grade II Tube type: Oral Tube size: 7.5 mm Number of attempts: 1 Airway Equipment and Method: Stylet Placement Confirmation: ETT inserted through vocal cords under direct vision,  positive ETCO2 and breath sounds checked- equal and bilateral Secured at: 23 cm Tube secured with: Tape Dental Injury: Teeth and Oropharynx as per pre-operative assessment

## 2014-11-12 NOTE — Op Note (Signed)
11/12/2014  1:44 PM  PATIENT:  [name], 29 y.o., male, MRN: [id number]  PREOP DIAGNOSIS:  Morbid Obesity  POSTOP DIAGNOSIS:   Morbid Obesity, Weight - 291, BMI - 47.07, Hiatal hernia  PROCEDURE:   Laparoscopic adjustable gastric band, AP Standard, repair of hiatal hernia  SURGEON:   Alphonsa Overall, M.D.  ASSISTANT:   B. Hoxworth, MD  ANESTHESIA:   general  Anesthesiologist: Nolon Nations, MD CRNA: Montel Clock, CRNA  General  EBL:  minimal  ml  BLOOD ADMINISTERED: none  LOCAL MEDICATIONS USED:   30 cc 1/4% marcaine  SPECIMEN:   None  COUNTS CORRECT:  YES  INDICATIONS FOR PROCEDURE:  [name] is a 29 y.o. (DOB: March 22, 1986) AA  male whose primary care physician is AVBUERE,EDWIN A, MD and comes for adjustable laparoscopic gastric band placement.   He is a ESRD patient and gets hemodialysis on MWF.  He had dialysis Saturday, 11/10/2014.   The indications and risks of the Lap Band surgery were explained to the patient.  The risks include, but are not limited to, infection, bleeding, bowel injury, and failure to lose weight.  Long term risk include, but are not limited to, slippage and erosion of the Lap Band.  OPERATIVE NOTE:  The patient was taken to room #1 at [facility] for the operation.  Anesthesiologist: Nolon Nations, MD CRNA: Montel Clock, CRNA was the supervising anesthesiologist.  The patient's abdomen was prepped with Chloroprep and sterilely draped.  The patient was given Ancef at the beginning of the operation.   A time out was held and the surgical checklist reviewed.   The abdomen was accessed in the LUQ with an 5 mm trocar.  Five additional trocars were placed: a 5 mm trocar in the subxiphoid area for the liver retractor, a 15 mm trocar in the right costal margin, a 12 mm trocar to the right of midline, a 5 mm trocar to the left of midline and a 5 mm trocar in the left lateral subcostal area.   An abdominal exploration was carried out.  The liver was  unremarkable.  The stomach was unremarkable.  The remainder of the bowel, which could be seen, was unremarkable.   A test for a hiatal hernia was done using the sizing balloon.  The sizing tube was advanced into the stomach, 15 cc of air was placed in the sizing balloon, and the balloon pulled back to the esophageal hiatus.  He did have a hiatal hernia that the 15 cc balloon would go through.   I opened up the gastro-hepatic ligament, identified the right crus and dissected behind the stomach.  I identified the left crus.  I placed 2 sutures of 0 Ethibond through the left and right cruz to close the hiatal hernia posteriorly.  I secured these sutures with Tye-knotsI then retested with the sizing balloon.  This time with 15 cc of air in the balloon, the balloon did not go through. With 10 cc of air, the balloon barely went through the hiatus.  I decided to stop my hiatal hernia repair at 2 sutures.   I made an incision at the Angle of Hiss and identified the left crus.  I identified the right crus and passed the "finger" dissector behind the gastroesophageal junction.   I then used a AP Standard Lap Band and inserted the silastic tubing through the tip of the finger dissector and pulled the tubing around the proximal stomach.  The sizing tube was passed into the  stomach and the lap band closed over the sizing tube.  The sizing tube was removed.   I then imbricated the lateral stomach over the lap band and sewed it to the stomach proximal to the lap band.  I used 2-0 Ethibond sutures secured with Tye-Knots to create the gastro-gastric imbrication over the lateral Lap Band.  I placed 3 sutures. Photos were taken and placed in the chart.  The lap band and tubing lay in good position.  The tubing was brought through the right lateral trocar site and attached to the lap band reservoir.  The reservoir has a piece of polypropylene mesh sewn on the back to fixate it in the subcutaneous tissues.   I infiltrated  Marcaine into each incision for a total of 30 cc.   Each incision was sewn with 5-0 Monocryl sutures and the skin was painted with Dermabond.  The patient tolerated the procedure well and was sent to the recovery room in good condition.  The sponge and needle count were correct at the end of the case.  Alphonsa Overall, MD, [facility] Pager: [phone] Office phone:  [phone]

## 2014-11-12 NOTE — Anesthesia Preprocedure Evaluation (Addendum)
Anesthesia Evaluation  Patient identified by MRN, date of birth, ID band Patient awake    Reviewed: Allergy & Precautions, H&P , NPO status , Patient's Chart, lab work & pertinent test results  History of Anesthesia Complications (+) Family history of anesthesia reactionNegative for: history of anesthetic complications  Airway Mallampati: I  TM Distance: >3 FB Neck ROM: Full    Dental  (+) Teeth Intact, Dental Advisory Given   Pulmonary Current Smoker,  breath sounds clear to auscultation        Cardiovascular hypertension, - anginaRhythm:Regular Rate:Normal     Neuro/Psych  Headaches,    GI/Hepatic Neg liver ROS, GERD-  Medicated and Poorly Controlled,  Endo/Other  Morbid obesity  Renal/GU ESRF and DialysisRenal disease (K+ 3.3)MWF HD patient. Last HD 03-26-14     Musculoskeletal  (+) Arthritis -,   Abdominal (+) + obese,   Peds  Hematology   Anesthesia Other Findings   Reproductive/Obstetrics                            Anesthesia Physical  Anesthesia Plan  ASA: III  Anesthesia Plan: General   Post-op Pain Management:    Induction: Intravenous  Airway Management Planned: Oral ETT  Additional Equipment: None  Intra-op Plan:   Post-operative Plan: Extubation in OR  Informed Consent:   Dental advisory given  Plan Discussed with: CRNA  Anesthesia Plan Comments:         Anesthesia Quick Evaluation

## 2014-11-13 ENCOUNTER — Encounter (HOSPITAL_COMMUNITY): Payer: Self-pay | Admitting: Surgery

## 2014-11-13 MED ORDER — PHENOL 1.4 % MT LIQD
1.0000 | OROMUCOSAL | Status: DC | PRN
  Filled 2014-11-13 (×2): qty 177

## 2014-11-13 NOTE — Discharge Summary (Addendum)
Physician Discharge Summary  Patient ID:  [name]  MRN: [id number]  DOB/AGE: [birth year] 29 y.o.  Admit date: 11/12/2014 Discharge date: 11/14/2014  Discharge Diagnoses:  1. Morbid obesity Weight - 291, BMI 47.07 2. Hiatal hernia  This was repaired at the time of the lap band placement 3. ESRD - Dr. Lenna Sciara. [facility] - MWF 4. HTN 5. 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 Gerkin 7. Arthritis right knee - Dr. Marlou Sa 8. GERD   Active Problems:   Morbid obesity, weight 291, BMI - 47  Operation: Procedure(s): LAPAROSCOPIC GASTRIC BANDING, repair of Hiatal Hernia on 11/12/2014 - D. Lucia Gaskins  Discharged Condition: good  Hospital Course: [name] is an 29 y.o. male whose primary care physician is Philis Fendt, MD and who was admitted 11/12/2014 with a chief complaint of Morbid obesity.   He was brought to the operating room on 11/12/2014 and underwent  Elliott, repair of Hiatal Hernia.   He had a lot of pain the first 24 hours.  His father was in the room the first day after surgery.  So I decided to keep him in the hospital a second day.  He has tolerated liquids.  His KUB shows good position of the lap band.  His CBC is okay.  He is ready for discharge.  He is to go to dialysis today after discharge.  The discharge instructions were reviewed with the patient.  Consults: None  Significant Diagnostic Studies: Results for orders placed or performed during the hospital encounter of 11/12/14  CBC WITH DIFFERENTIAL  Result Value Ref Range   WBC 11.5 (H) 4.0 - 10.5 K/uL   RBC 3.80 (L) 4.22 - 5.81 MIL/uL   Hemoglobin 12.0 (L) 13.0 - 17.0 g/dL   HCT 36.1 (L) 39.0 - 52.0 %   MCV 95.0 78.0 - 100.0 fL   MCH 31.6 26.0 - 34.0 pg   MCHC 33.2 30.0 - 36.0 g/dL   RDW 14.1 11.5 - 15.5 %   Platelets 171 150 - 400 K/uL   Neutrophils Relative % 52 43 - 77 %   Neutro Abs 6.0 1.7 - 7.7 K/uL   Lymphocytes Relative 35 12 - 46 %   Lymphs  Abs 4.0 0.7 - 4.0 K/uL   Monocytes Relative 10 3 - 12 %   Monocytes Absolute 1.2 (H) 0.1 - 1.0 K/uL   Eosinophils Relative 3 0 - 5 %   Eosinophils Absolute 0.3 0.0 - 0.7 K/uL   Basophils Relative 0 0 - 1 %   Basophils Absolute 0.1 0.0 - 0.1 K/uL  Comprehensive metabolic panel  Result Value Ref Range   Sodium 134 (L) 135 - 145 mmol/L   Potassium 3.8 3.5 - 5.1 mmol/L   Chloride 96 96 - 112 mmol/L   CO2 22 19 - 32 mmol/L   Glucose, Bld 89 70 - 99 mg/dL   BUN 64 (H) 6 - 23 mg/dL   Creatinine, Ser 11.19 (H) 0.50 - 1.35 mg/dL   Calcium 7.9 (L) 8.4 - 10.5 mg/dL   Total Protein 7.7 6.0 - 8.3 g/dL   Albumin 4.1 3.5 - 5.2 g/dL   AST 13 0 - 37 U/L   ALT 18 0 - 53 U/L   Alkaline Phosphatase 44 39 - 117 U/L   Total Bilirubin 0.6 0.3 - 1.2 mg/dL   GFR calc non Af Amer 5 (L) >90 mL/min   GFR calc Af Amer 6 (L) >90 mL/min   Anion gap 16 (  H) 5 - 15  Hemoglobin and hematocrit, blood  Result Value Ref Range   Hemoglobin 12.6 (L) 13.0 - 17.0 g/dL   HCT 37.4 (L) 39.0 - 52.0 %    Dg Abd 1 View  11/12/2014   CLINICAL DATA:  Abdominal pain scratch the persistent abdominal pain. Lab and surgery today.  EXAM: ABDOMEN - 1 VIEW  COMPARISON:  None.  FINDINGS: The lap band is positioned at 33 degrees from the horizontal, within normal limits. Gas is present within the more distal stomach. The bowel gas pattern is otherwise normal. The heart size scratch the the heart is mildly enlarged.  IMPRESSION: 1. Interval placement of lap band without radiographic evidence for complication. 2. Stable cardiomegaly without failure   Electronically Signed   By: San Morelle M.D.   On: 11/12/2014 17:20   Dg Duanne Limerick  W/kub  10/17/2014   CLINICAL DATA:  Morbid obesity. Pre-op evaluation for bariatric surgery.  EXAM: UPPER GI SERIES WITH KUB  TECHNIQUE: After obtaining a scout radiograph a routine upper GI series was performed using thin barium.  FLUOROSCOPY TIME:  Fluoroscopy Time (in minutes and seconds): 2 minutes 12  seconds  Number of Acquired Images:  18  COMPARISON:  None.  FINDINGS: Scout radiograph:  Unremarkable bowel gas pattern.  Esophagus: No evidence of esophageal mass or stricture. Motility is within normal limits. Gastroesophageal reflux was seen to the level of the mid thoracic esophagus.  Stomach: No hiatal hernia visualized. No evidence of gastric mass or ulcer.  Duodenum: No ulcer or other significant abnormality seen involving duodenal bulb or sweep.  Other:  None.  IMPRESSION: Gastroesophageal reflux to level of the mid thoracic esophagus. No evidence of hiatal hernia, esophageal stricture, or other significant abnormality.   Electronically Signed   By: Earle Gell M.D.   On: 10/17/2014 12:15    Discharge Exam:  Filed Vitals:   11/13/14 0508  BP: 110/64  Pulse: 97  Temp: 98.5 F (36.9 C)  Resp: 18    General: WN obese AA M who is alert and generally healthy appearing.  Lungs: Clear to auscultation and symmetric breath sounds. Heart:  RRR. No murmur or rub. Abdomen: Soft. No hernia. Normal bowel sounds. Incisions look okay.  Discharge Medications:     Medication List    ASK your doctor about these medications        calcium acetate 667 MG capsule  Commonly known as:  PHOSLO  Take 3,335 mg by mouth 3 (three) times daily with meals.     calcium carbonate 500 MG chewable tablet  Commonly known as:  TUMS - dosed in mg elemental calcium  Chew 4 tablets by mouth 3 (three) times daily with meals.     methocarbamol 500 MG tablet  Commonly known as:  ROBAXIN  Take 500 mg by mouth every 6 (six) hours as needed for muscle spasms.     omeprazole 20 MG capsule  Commonly known as:  PRILOSEC  Take 20 mg by mouth daily as needed (for heartburn).     oxyCODONE 5 MG immediate release tablet  Commonly known as:  Oxy IR/ROXICODONE  Take 5-10 mg by mouth every 3 (three) hours as needed for moderate pain or breakthrough pain.        Disposition: 01-Home or Self Care  Activity:   Driving - May drive in 3 or 4 days, if doing well   Lifting - No lifting more than 15 pounds for 10 days, then no limit  Wound Care:   May shower starting today  Diet:  Lap band diet  Follow up appointment:  Call Dr. Pollie Friar office [facility]) at [phone] for an appointment in 2 [place].  Medications and dosages:  Resume your home medications.  You have a prescription for:  Oxycodone.   Signed: Alphonsa Overall, M.D., [facility] Office:  [phone]  11/13/2014, 7:23 AM

## 2014-11-13 NOTE — Progress Notes (Signed)
General Surgery Note  LOS: 1 day  POD -  1 Day Post-Op  Assessment/Plan: 1.  LAPAROSCOPIC GASTRIC BANDING, repair of [place] - November 17, 2014 - D. Bronx Brogden  Morbid obesity - weight - 291, BMI - 47.07  More pain that expected.  VSS.  Will keep till tomorrow AM.  2. ESRD - Dr. Lenna Sciara. [facility] is MWF 3. HTN 4. GERD 5. 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 Gerkin 6. Arthritis right knee - Dr. Marlou Sa 7.  DVT prophylaxis - SQ Heparin   Active Problems:   Morbid obesity, weight 291, BMI - 47  Subjective:  Having pain with swallowing and epigastric pain.  He is not ready to go home.  His father is in the room.  Objective:   Filed Vitals:   11/13/14 1306  BP: 136/73  Pulse: 89  Temp: 98.8 F (37.1 C)  Resp: 18     Intake/Output from previous day:  11-17-22 0701 - 04/12 0700 In: 710 [I.V.:650] Out: 15 [Blood:15]  Intake/Output this shift:  Total I/O In: 60 [P.O.:60] Out: -    Physical Exam:   General: Obese AA M who is alert.  He is uncomfortable.   HEENT: Normal. Pupils equal. .   Lungs: Clear.   Abdomen: Soft.  Has BS.   Wound: Clean   Lab Results:    Recent Labs  11-17-14 1005 2014-11-17 1637  WBC 11.5*  --   HGB 12.0* 12.6*  HCT 36.1* 37.4*  PLT 171  --     BMET   Recent Labs  17-Nov-2014 1005  NA 134*  K 3.8  CL 96  CO2 22  GLUCOSE 89  BUN 64*  CREATININE 11.19*  CALCIUM 7.9*    PT/INR  No results for input(s): LABPROT, INR in the last 72 hours.  ABG  No results for input(s): PHART, HCO3 in the last 72 hours.  Invalid input(s): PCO2, PO2   Studies/Results:  Dg Abd 1 View  2014-11-17   CLINICAL DATA:  Abdominal pain scratch the persistent abdominal pain. Lab and surgery today.  EXAM: ABDOMEN - 1 VIEW  COMPARISON:  None.  FINDINGS: The lap band is positioned at 33 degrees from the horizontal, within normal limits. Gas is present within the more distal stomach. The bowel gas pattern is otherwise normal. The heart size scratch the the heart is  mildly enlarged.  IMPRESSION: 1. Interval placement of lap band without radiographic evidence for complication. 2. Stable cardiomegaly without failure   Electronically Signed   By: San Morelle M.D.   On: 11-17-2014 17:20     Anti-infectives:   Anti-infectives    Start     Dose/Rate Route Frequency Ordered Stop   2014-11-17 0600  ceFAZolin (ANCEF) 3 g in dextrose 5 % 50 mL IVPB     3 g 160 mL/hr over 30 Minutes Intravenous On call to O.R. 11/11/14 1543 2014/11/17 1219      Alphonsa Overall, MD, FACS Pager: [place] [facility] Office: [phone] 11/13/2014

## 2014-11-13 NOTE — Progress Notes (Signed)
UR completed 

## 2014-11-13 NOTE — Progress Notes (Signed)
Patient alert and oriented, Post op day 1.  Provided support and encouragement.  Encouraged pulmonary toilet, ambulation and small sips of liquids.  All questions answered.  Will continue to monitor. 

## 2014-11-13 NOTE — Plan of Care (Signed)
Problem: Phase I Progression Outcomes Goal: Voiding-avoid urinary catheter unless indicated Outcome: Not Progressing Pt has end stage renal disease and on routine dialysis. Last dialysis Saturday. Goal: Pulmonary function stable Outcome: Completed/Met Date Met:  11/13/14 Using IS q1 hr with demonstrated understanding.

## 2014-11-13 NOTE — Anesthesia Postprocedure Evaluation (Signed)
Anesthesia Post Note  Patient: [name]  Procedure(s) Performed: Procedure(s) (LRB): LAPAROSCOPIC GASTRIC BANDING (N/A)  Anesthesia type: General  Patient location: PACU  Post pain: Pain level controlled  Post assessment: Post-op Vital signs reviewed  Last Vitals: BP 114/61 mmHg  Pulse 93  Temp(Src) 36.6 C (Oral)  Resp 18  Ht 5\' 6"  (1.676 m)  Wt 302 lb 12.8 oz (137.349 kg)  BMI 48.90 kg/m2  SpO2 100%  Post vital signs: Reviewed  Level of consciousness: sedated  Complications: No apparent anesthesia complications

## 2014-11-14 ENCOUNTER — Emergency Department (HOSPITAL_COMMUNITY)
Admission: EM | Admit: 2014-11-14 | Discharge: 2014-11-15 | Disposition: A | Discharge status: Home / Self Care | Payer: Medicare Other | Attending: Emergency Medicine | Admitting: Emergency Medicine

## 2014-11-14 ENCOUNTER — Encounter (HOSPITAL_COMMUNITY): Payer: Self-pay | Admitting: *Deleted

## 2014-11-14 ENCOUNTER — Telehealth: Payer: Self-pay | Admitting: General Surgery

## 2014-11-14 ENCOUNTER — Emergency Department (HOSPITAL_COMMUNITY): Payer: Medicare Other

## 2014-11-14 DIAGNOSIS — R52 Pain, unspecified: Secondary | ICD-10-CM

## 2014-11-14 DIAGNOSIS — Z992 Dependence on renal dialysis: Secondary | ICD-10-CM | POA: Insufficient documentation

## 2014-11-14 DIAGNOSIS — K219 Gastro-esophageal reflux disease without esophagitis: Secondary | ICD-10-CM | POA: Diagnosis not present

## 2014-11-14 DIAGNOSIS — Z9889 Other specified postprocedural states: Secondary | ICD-10-CM | POA: Diagnosis not present

## 2014-11-14 DIAGNOSIS — R109 Unspecified abdominal pain: Secondary | ICD-10-CM | POA: Diagnosis not present

## 2014-11-14 DIAGNOSIS — N186 End stage renal disease: Secondary | ICD-10-CM | POA: Diagnosis not present

## 2014-11-14 DIAGNOSIS — Z8739 Personal history of other diseases of the musculoskeletal system and connective tissue: Secondary | ICD-10-CM | POA: Insufficient documentation

## 2014-11-14 DIAGNOSIS — I120 Hypertensive chronic kidney disease with stage 5 chronic kidney disease or end stage renal disease: Secondary | ICD-10-CM | POA: Diagnosis not present

## 2014-11-14 DIAGNOSIS — G8918 Other acute postprocedural pain: Secondary | ICD-10-CM | POA: Diagnosis not present

## 2014-11-14 DIAGNOSIS — Z720 Tobacco use: Secondary | ICD-10-CM | POA: Diagnosis not present

## 2014-11-14 DIAGNOSIS — R1013 Epigastric pain: Secondary | ICD-10-CM | POA: Diagnosis present

## 2014-11-14 DIAGNOSIS — Z79899 Other long term (current) drug therapy: Secondary | ICD-10-CM | POA: Diagnosis not present

## 2014-11-14 LAB — CBC
HEMATOCRIT: 39.2 % (ref 39.0–52.0)
HEMOGLOBIN: 13.4 g/dL (ref 13.0–17.0)
MCH: 32.4 pg (ref 26.0–34.0)
MCHC: 34.2 g/dL (ref 30.0–36.0)
MCV: 94.7 fL (ref 78.0–100.0)
PLATELETS: 193 10*3/uL (ref 150–400)
RBC: 4.14 MIL/uL — AB (ref 4.22–5.81)
RDW: 14.1 % (ref 11.5–15.5)
WBC: 14.4 10*3/uL — ABNORMAL HIGH (ref 4.0–10.5)

## 2014-11-14 LAB — BASIC METABOLIC PANEL
Anion gap: 20 — ABNORMAL HIGH (ref 5–15)
BUN: 39 mg/dL — ABNORMAL HIGH (ref 6–23)
CO2: 19 mmol/L (ref 19–32)
Calcium: 10.4 mg/dL (ref 8.4–10.5)
Chloride: 96 mmol/L (ref 96–112)
Creatinine, Ser: 9.52 mg/dL — ABNORMAL HIGH (ref 0.50–1.35)
GFR calc Af Amer: 8 mL/min — ABNORMAL LOW (ref >90)
GFR calc non Af Amer: 7 mL/min — ABNORMAL LOW (ref >90)
GLUCOSE: 110 mg/dL — AB (ref 70–99)
POTASSIUM: 4.1 mmol/L (ref 3.5–5.1)
Sodium: 135 mmol/L (ref 135–145)

## 2014-11-14 LAB — CBC WITH DIFFERENTIAL/PLATELET
BASOS ABS: 0 10*3/uL (ref 0.0–0.1)
Basophils Relative: 0 % (ref 0–1)
Eosinophils Absolute: 0.1 10*3/uL (ref 0.0–0.7)
Eosinophils Relative: 1 % (ref 0–5)
HCT: 32.9 % — ABNORMAL LOW (ref 39.0–52.0)
Hemoglobin: 11.1 g/dL — ABNORMAL LOW (ref 13.0–17.0)
LYMPHS ABS: 2.2 10*3/uL (ref 0.7–4.0)
Lymphocytes Relative: 18 % (ref 12–46)
MCH: 31.9 pg (ref 26.0–34.0)
MCHC: 33.7 g/dL (ref 30.0–36.0)
MCV: 94.5 fL (ref 78.0–100.0)
Monocytes Absolute: 1.3 10*3/uL — ABNORMAL HIGH (ref 0.1–1.0)
Monocytes Relative: 11 % (ref 3–12)
NEUTROS PCT: 70 % (ref 43–77)
Neutro Abs: 8.4 10*3/uL — ABNORMAL HIGH (ref 1.7–7.7)
PLATELETS: 158 10*3/uL (ref 150–400)
RBC: 3.48 MIL/uL — AB (ref 4.22–5.81)
RDW: 13.9 % (ref 11.5–15.5)
WBC: 11.9 10*3/uL — ABNORMAL HIGH (ref 4.0–10.5)

## 2014-11-14 LAB — HEPATIC FUNCTION PANEL
ALT: 18 U/L (ref 0–53)
AST: 19 U/L (ref 0–37)
Albumin: 4.8 g/dL (ref 3.5–5.2)
Alkaline Phosphatase: 57 U/L (ref 39–117)
Bilirubin, Direct: 0.2 mg/dL (ref 0.0–0.5)
Indirect Bilirubin: 0.7 mg/dL (ref 0.3–0.9)
TOTAL PROTEIN: 9 g/dL — AB (ref 6.0–8.3)
Total Bilirubin: 0.9 mg/dL (ref 0.3–1.2)

## 2014-11-14 LAB — I-STAT TROPONIN, ED: Troponin i, poc: 0 ng/mL (ref 0.00–0.08)

## 2014-11-14 MED ORDER — ONDANSETRON HCL 4 MG/2ML IJ SOLN
4.0000 mg | Freq: Once | INTRAMUSCULAR | Status: AC
  Administered 2014-11-15: 4 mg via INTRAVENOUS
  Filled 2014-11-14: qty 2

## 2014-11-14 MED ORDER — MORPHINE SULFATE 4 MG/ML IJ SOLN
4.0000 mg | Freq: Once | INTRAMUSCULAR | Status: AC
  Administered 2014-11-15: 4 mg via INTRAVENOUS
  Filled 2014-11-14: qty 1

## 2014-11-14 MED ORDER — SODIUM CHLORIDE 0.9 % IV BOLUS (SEPSIS)
1000.0000 mL | Freq: Once | INTRAVENOUS | Status: AC
  Administered 2014-11-15: 1000 mL via INTRAVENOUS

## 2014-11-14 NOTE — Telephone Encounter (Signed)
Pt called stating severe pain uncontrolled with current pain medications.  He is s/p lap band surgery and states his pain was worse after lying down to take a nap.  He was just discharged today.  He has not ate or drank anything recently.  Pt advised to go to ED if pain not controlled with PO meds.

## 2014-11-14 NOTE — Discharge Instructions (Signed)
[facility] - DISCHARGE INSTRUCTIONS TO PATIENT  Activity:  Driving - May drive in 3 or 4 days, if doing well   Lifting - No lifting more than 15 pounds for 10 days, then no limit  Wound Care:   May shower starting today  Diet:  Lap band diet  Follow up appointment:  Call Dr. Pollie Friar office [facility]) at [phone] for an appointment in 2 [place].  Medications and dosages:  Resume your home medications.  You have a prescription for:  Oxycodone.  Call Dr. Lucia Gaskins or his office  [phone]) if you have:  Temperature greater than 100.4,  Persistent nausea and vomiting,  Severe uncontrolled pain,  Redness, tenderness, or signs of infection (pain, swelling, redness, odor or green/yellow discharge around the site),  Difficulty breathing, headache or visual disturbances,  Any other questions or concerns you may have after discharge.  In an emergency, call 911 or go to an Emergency Department at a nearby hospital.                      [place] Instructions   These instructions are to help you care for yourself when you go home.  Call: If you have any problems. Call [phone] and ask for the surgeon on call If you need immediate assistance come to the ER at [facility] Tell the ER staff you are a new post-op gastric banding patient  Signs and symptoms to report: Severe  vomiting or nausea If you cannot handle clear liquids for longer than 1 day, call your surgeon Abdominal pain which does not get better after taking your pain medication Fever greater than 100.4  F and chills Heart rate over 100 beats a minute Trouble breathing Chest pain Redness,  swelling, drainage, or foul odor at incision (surgical) sites If your incisions open or pull apart Swelling or pain in calf (lower leg) Diarrhea (Loose bowel movements that happen often), frequent watery, uncontrolled bowel movements Constipation, (no bowel movements for 3  days) if this happens: Take Milk of Magnesia, 2 tablespoons by mouth, 3 times a day for 2 days if needed Stop taking Milk of Magnesia once you have had a bowel movement Call your doctor if constipation continues Or Take Miralax  (instead of Milk of Magnesia) following the label instructions Stop taking Miralax once you have had a bowel movement Call your doctor if constipation continues Anything you think is abnormal for you   Normal side effects after surgery: Unable to sleep at night or unable to concentrate Irritability Being tearful (crying) or depressed  These are common complaints, possibly related to your anesthesia, stress of surgery, and change in lifestyle, that usually go away a few [place] after surgery. If these feelings continue, call your medical doctor.  Wound Care: You may have surgical glue, steri-strips, or staples over your incisions after surgery Surgical glue: Looks like clear film over your incisions and will wear off a little at a time Steri-strips: Adhesive strips of tape over your incisions. You may notice a yellowish color on skin under the steri-strips. This is used to make the steri-strips stick better. Do not pull the steri-strips off - let them fall off Staples: Staples may be removed before you leave the hospital If you go home with staples, call [facility] for an appointment with your surgeons nurse to have staples removed 10 days after surgery, [phone] Showering: You may shower two (2) days after  your surgery unless your surgeon tells you differently Wash gently around incisions with warm soapy water, rinse well, and gently pat dry If you have a drain (tube from your incision), you may need someone to hold this while you shower No tub baths until staples are removed and incisions are healed   Medications: Medications should be liquid or crushed if larger than the size of a dime Extended release pills (medication that releases a little  bit at a time through the  day) should not be crushed Depending on the size and number of medications you take, you may need to space (take a few throughout the day)/change the time you take your medications so that you do not over-fill your pouch (smaller stomach) Make sure you follow-up with you primary care physician to make medication changes needed during rapid weight loss and life -style changes If you have diabetes, follow up with your doctor that orders your diabetes medication(s) within one week after surgery and check your blood sugar regularly  Do not drive while taking narcotics (pain medications)  Do not take acetaminophen (Tylenol) and Roxicet or Lortab Elixir at the same time since these pain medications contain acetaminophen   Diet:  First 2 Mago You will see the nutritionist about two (2) Dominique after your surgery. The nutritionist will increase the types of foods you can eat if you are handling liquids well: If you have severe vomiting or nausea and cannot handle clear liquids lasting longer than 1 day call your surgeon For Same Day Surgery Discharge Patients: The day of surgery drink water only: 2 ounces every 4 hours If you are handling water, start drinking your high protein shake the next morning For Overnight Stay Patients: Begin by drinking 2 ounces of a high protein every 3 hours, 5-6 times per day Slowly increase the amount you drink as tolerated You may find it easier to slowly sip shakes throughout the day. It is important to get your proteins in first    Protein Shake Drink at least 2 ounces of shake 5-6 times per day Each serving of protein shakes (usually 8-12 ounces) should have a minimum of: 15 grams of protein And no more than 5 grams of carbohydrate Goal for protein each day: Men = 80 grams per day Women = 60 grams per day Protein powder may be added to fluids such as non-fat milk or Lactaid milk or Soy milk (limit to 35 grams added protein powder per  serving)  Hydration Slowly increase the amount of water and other clear liquids as tolerated (See Acceptable Fluids) Slowly increase the amount of protein shake as tolerated Sip fluids slowly and throughout the day May use sugar substitutes in small amounts (no more than 6-8 packets per day; i.e. Splenda)  Fluid Goal The first goal is to drink at least 8 ounces of protein shake/drink per day (or as directed by the nutritionist); some examples of protein shakes are Syntrax, Nectar, [employer], EAS Edge HP, and Unjury. - See handout from pre-op Bariatric Education Class: Slowly increase the amount of protein shake you drink as tolerated You may find it easier to slowly sip shakes throughout the day It is important to get your proteins in first Your fluid goal is to drink 64-100 ounces of fluid daily It may take a few [place] to build up to this  32 oz. (or more) should be full liquids (see below for examples) Liquids should not contain sugar, caffeine, or carbonation  Clear Liquids:  Water of Sugar-free flavored water (i.e. Fruit HO, Propel) Decaffeinated coffee or tea (sugar-free) Crystal lite, Wylers Lite, Minute Maid Lite Sugar-free Jell-O Bouillon or broth Sugar-free Popsicle:    - Less than 20 calories each; Limit 1 per day        Full Liquids:                   Protein Shakes/Drinks + 2 choices per day of other full liquids Full liquids must be: No More Than 12 grams of Carbs per serving No More Than 3 grams of Fat per serving Strained low-fat cream soup Non-Fat milk Fat-free Lactaid Milk Sugar-free yogurt (Dannon Lite & Fit, Greek yogurt)   Vitamins and Minerals Start 1 day after surgery unless otherwise directed by your surgeon 1 Chewable Multivitamin / Multimineral Supplement with iron (i.e. Centrum for Adults) Chewable Calcium Citrate with Vitamin D-3 (Example: 3 Chewable Calcium  Plus 600 with vitamin D-3) Take 500 mg three (3) times a day for a total of 1500  mg per day Do not take all 3 doses of calcium at one time as it may cause constipation, and you can only absorb 500 mg at a time Do not mix multivitamins containing iron with calcium supplements;  take 2 hours apart Do not substitute Tums (calcium carbonate) for your calcium Menstruating women and those at risk for anemia ( a blood disease that causes weakness) may need extra iron Talk to your doctor to see if you need more iron If you need extra iron: total daily iron recommendation (including Vitamins) is 50 to 100 mg Iron/day Do not stop taking or change any vitamins or minerals until you talk to your nutritionist or surgeon Your nutritionist and/or surgeon must approve all vitamin and mineral supplements  Activity and Exercise: It is important to continue walking at home. Limit your physical activity as instructed by your doctor. During this time, use these guidelines: Do not lift anything greater than ten  (10) pounds for at least two (2) [place] Do not go back to work or drive until Engineer, production says you can You may have sex when you feel comfortable It is VERY important for male patients to use a reliable birth control method; fertility often increase after surgery Do not get pregnant for at least 18 months Start exercising as soon as your doctor tells you that you can Make sure your doctor approves any physical activity Start with a simple walking program Walk 5-15 minutes each day, 7 days per week Slowly increase until you are walking 30-45 minutes per day Consider joining our [place] program. [phone] or email [email or web address]    Special Instructions  Things to remember: Free counseling is available for you and your family through collaboration between [facility] and [place]. Please call [phone] and leave a message Use your CPAP when sleeping if this applies to you Consider buying a medical alert bracelet that says you had lap-band surgery You will likely have your first fill  (fluid added to your band) 6 - 8 [place] after surgery [facility] has a free Bariatric Surgery Support Group that meets monthly, the 3rd Thursday, [place]. You can see classes online at [email or web address] It is very important to keep all follow up appointments with your surgeon, nutritionist, primary care physician, and behavioral health practitioner After the first year, please follow up with your bariatric surgeon and nutritionist at least once a year in order to maintain best weight loss  results                    [place] [place] [facility]:  [place]: [phone]               Bariatric Nurse Coordinator: [phone]      Adjustable Gastric Band Home Care Instructions  Rev. 09/2012                                                                  Reviewed and Endorsed                                                   by [facility] Patient Education Committee, Jan, 2014

## 2014-11-14 NOTE — ED Notes (Signed)
Pt states that he had Lap Band placed on Monday; pt states that he was released from the hospital this afternoon and then went to dialysis; pt states that after dialysis he began to have epigastric pain; pt states that he went home and laid down and took a nap and when he woke up the pain was worse; pt c/o nausea with some "spitting up"; pt states that he called the surgeon on call and they advised for him to come to the ER

## 2014-11-14 NOTE — Progress Notes (Signed)
Patient is alert and oriented.  Pain is controlled, and patient is tolerating fluids.  Advanced to protein shake yesterday, patient tolerated well.  Reviewed Adjustable gastric band discharge instructions with patient, patient able to articulate understanding.  Provided information on BELT program, Support Group and [facility] outpatient pharmacy. All questions answered, will continue to monitor.

## 2014-11-14 NOTE — Progress Notes (Signed)
Discharge instructions given to patient. Questions answered 

## 2014-11-15 ENCOUNTER — Encounter (HOSPITAL_COMMUNITY): Payer: Self-pay | Admitting: Emergency Medicine

## 2014-11-15 ENCOUNTER — Emergency Department (HOSPITAL_COMMUNITY): Payer: Medicare Other

## 2014-11-15 DIAGNOSIS — R109 Unspecified abdominal pain: Secondary | ICD-10-CM | POA: Diagnosis not present

## 2014-11-15 MED ORDER — IOHEXOL 300 MG/ML  SOLN
50.0000 mL | Freq: Once | INTRAMUSCULAR | Status: AC | PRN
  Administered 2014-11-15: 50 mL via ORAL

## 2014-11-15 MED ORDER — MORPHINE SULFATE 4 MG/ML IJ SOLN
4.0000 mg | Freq: Once | INTRAMUSCULAR | Status: AC
  Administered 2014-11-15: 4 mg via INTRAVENOUS
  Filled 2014-11-15: qty 1

## 2014-11-15 MED ORDER — IOHEXOL 300 MG/ML  SOLN
100.0000 mL | Freq: Once | INTRAMUSCULAR | Status: AC | PRN
  Administered 2014-11-15: 100 mL via INTRAVENOUS

## 2014-11-15 NOTE — Discharge Instructions (Signed)

## 2014-11-15 NOTE — ED Provider Notes (Signed)
CSN: [id number]     Arrival date & time 11/14/14  2124 History   First MD Initiated Contact with Patient 11/14/14 2305     Chief Complaint  Patient presents with  . Chest Pain     (Consider location/radiation/quality/duration/timing/severity/associated sxs/prior Treatment) Patient is a 29 y.o. male presenting with chest pain. The history is provided by the patient.  Chest Pain Pain location:  Epigastric (right upper abdomen) Pain quality: dull and sharp   Radiates to: entire abdomen. Pain radiates to the back: no   Pain severity:  Severe Onset quality:  Gradual Timing:  Constant Progression:  Unchanged Chronicity:  New Context: at rest   Relieved by:  Nothing Worsened by:  Nothing tried Ineffective treatments:  None tried Associated symptoms: abdominal pain   Associated symptoms: no shortness of breath and not vomiting   Abdominal pain:    Location:  Generalized   Quality:  Dull   Onset quality:  Gradual   Timing:  Constant   Progression:  Unchanged Risk factors: male sex and obesity   Risk factors: no Ehlers-Danlos syndrome   States pain is in the abdomen not really chest but is so bad he can feel it everywhere.  No SOB no cough  Past Medical History  Diagnosis Date  . Hyperparathyroidism   . Morbid obesity   . Arthritis   . Complication of anesthesia     A little while to wake up after knee surgery in 2008  . Family history of anesthesia complication     mom slow to wake up  . Headache(784.0)     occasionally  . Dizziness     when coming off of dialysis  . Joint pain   . Joint swelling   . GERD (gastroesophageal reflux disease)     takes Omeprazole as needed  . Hypertension   . Renal disorder     MWF and goes to [facility]   Past Surgical History  Procedure Laterality Date  . Av fistula placement Bilateral     Uses Right arm  . Esophagogastroduodenoscopy endoscopy    . Knee arthroscopy Left 2007  . Parathyroidectomy N/A 03/30/2013    Procedure:  TOTAL PARATHYROIDECTOMY WITH AUTOTRANSPLANT;  Surgeon: Earnstine Regal, MD;  Location: [facility];  Service: General;  Laterality: N/A;  Autotransplant to left lower arm.  . Total knee arthroplasty Right 03/27/2014    DR Marlou Sa  . Total knee arthroplasty Right 03/27/2014    Procedure: UNICOMPARTMENTAL ARTHROPLASTY;  Surgeon: Meredith Pel, MD;  Location: [place];  Service: Orthopedics;  Laterality: Right;  . Breath tek h pylori N/A 05/15/2014    Procedure: BREATH TEK H PYLORI;  Surgeon: Alphonsa Overall, MD;  Location: [facility];  Service: General;  Laterality: N/A;  . Laparoscopic gastric banding N/A 11/12/2014    Procedure: LAPAROSCOPIC GASTRIC BANDING;  Surgeon: Alphonsa Overall, MD;  Location: [facility];  Service: General;  Laterality: N/A;   History reviewed. No pertinent family history. History  Substance Use Topics  . Smoking status: Current Some Day Smoker -- 0.20 packs/day for 13 years    Types: Cigarettes  . Smokeless tobacco: Never Used     Comment: 2 cigs a day  . Alcohol Use: No    Review of Systems  Respiratory: Negative for shortness of breath.   Cardiovascular: Positive for chest pain.  Gastrointestinal: Positive for abdominal pain. Negative for vomiting.  All other systems reviewed and are negative.     Allergies  Review of patient's allergies  indicates no known allergies.  Home Medications   Prior to Admission medications   Medication Sig Start Date End Date Taking? Authorizing Provider  calcium acetate (PHOSLO) 667 MG capsule Take 3,335 mg by mouth 3 (three) times daily with meals.     Historical Provider, MD  calcium carbonate (TUMS - DOSED IN MG ELEMENTAL CALCIUM) 500 MG chewable tablet Chew 4 tablets by mouth 3 (three) times daily with meals.     Historical Provider, MD  methocarbamol (ROBAXIN) 500 MG tablet Take 500 mg by mouth every 6 (six) hours as needed for muscle spasms (muscle spasms).  03/29/14   Meredith Pel, MD  omeprazole (PRILOSEC) 20 MG capsule Take 20 mg  by mouth daily as needed (for heartburn).     Historical Provider, MD  oxyCODONE (OXY IR/ROXICODONE) 5 MG immediate release tablet Take 5-10 mg by mouth every 3 (three) hours as needed for moderate pain or breakthrough pain. 03/29/14   Meredith Pel, MD   BP 131/47 mmHg  Pulse 88  Temp(Src) 98.4 F (36.9 C) (Oral)  Resp 21  SpO2 97% Physical Exam  Constitutional: He is oriented to person, place, and time. He appears well-developed and well-nourished.  HENT:  Head: Normocephalic and atraumatic.  Mouth/Throat: Oropharynx is clear and moist.  Eyes: Conjunctivae are normal. Pupils are equal, round, and reactive to light.  Neck: Normal range of motion. Neck supple.  Cardiovascular: Normal rate, regular rhythm and intact distal pulses.   Pulmonary/Chest: Effort normal and breath sounds normal. No respiratory distress. He has no wheezes. He has no rales.  Abdominal: Soft. Bowel sounds are normal. There is no tenderness. There is no rigidity, no rebound, no guarding, no tenderness at McBurney's point and negative Murphy's sign.  Musculoskeletal: Normal range of motion.  Neurological: He is alert and oriented to person, place, and time.  Skin: Skin is warm and dry. He is not diaphoretic.  Psychiatric: He has a normal mood and affect.    ED Course  Procedures (including critical care time) Labs Review Labs Reviewed  CBC - Abnormal; Notable for the following:    WBC 14.4 (*)    RBC 4.14 (*)    All other components within normal limits  BASIC METABOLIC PANEL - Abnormal; Notable for the following:    Glucose, Bld 110 (*)    BUN 39 (*)    Creatinine, Ser 9.52 (*)    GFR calc non Af Amer 7 (*)    GFR calc Af Amer 8 (*)    Anion gap 20 (*)    All other components within normal limits  HEPATIC FUNCTION PANEL - Abnormal; Notable for the following:    Total Protein 9.0 (*)    All other components within normal limits  URINALYSIS, ROUTINE W REFLEX MICROSCOPIC  I-STAT TROPOININ, ED     Imaging Review No results found.   EKG Interpretation   Date/Time:  Wednesday Hortense Cantrall 13 2016 21:46:51 EDT Ventricular Rate:  104 PR Interval:  144 QRS Duration: 103 QT Interval:  317 QTC Calculation: 417 R Axis:   3 Text Interpretation:  Sinus tachycardia RSR' in V1 or V2, probably normal  variant Confirmed by [facility]  MD, Marissah Vandemark ([id number]) on 11/14/2014  11:14:12 PM      MDM   Final diagnoses:  Pain    Medications  sodium chloride 0.9 % bolus 1,000 mL (0 mLs Intravenous Stopped 11/15/14 0350)  morphine 4 MG/ML injection 4 mg (4 mg Intravenous Given 11/15/14 0018)  ondansetron (ZOFRAN)  injection 4 mg (4 mg Intravenous Given 11/15/14 0017)  iohexol (OMNIPAQUE) 300 MG/ML solution 50 mL (50 mLs Oral Contrast Given 11/15/14 0131)  iohexol (OMNIPAQUE) 300 MG/ML solution 100 mL (100 mLs Intravenous Contrast Given 11/15/14 0237)  morphine 4 MG/ML injection 4 mg (4 mg Intravenous Given 11/15/14 0449)   555 Case d/w Dr. Marcello Moores who reports she saw the patient and he was sleeping.  She will talk to Dr. Lucia Gaskins who will follow up with the patient as an outpatient   Anjelique Makar, MD 11/15/14 [phone]

## 2014-11-15 NOTE — ED Notes (Signed)
Pt stated he is unable to give a urine sample at this time. 

## 2014-11-16 ENCOUNTER — Telehealth (HOSPITAL_COMMUNITY): Payer: Self-pay

## 2014-11-16 NOTE — Telephone Encounter (Signed)
Made discharge phone call to patient per DROP protocol. Asking the following questions.    1. Do you have someone to care for you now that you are home?  yes 2. Are you having pain now that is not relieved by your pain medication?  A little bit, oxycodone doesn't really help, right side, near incision 3. Are you able to drink the recommended daily amount of fluids (48 ounces minimum/day) and protein (60-80 grams/day) as prescribed by the dietitian or nutritional counselor?   4. Are you taking the vitamins and minerals as prescribed?  yes 5. Do you have the "on call" number to contact your surgeon if you have a problem or question?  yes 6. Are your incisions free of redness, swelling or drainage? (If steri strips, address that these can fall off, shower as tolerated) no, one incision looks a little red and it is more painful, not war, to touch and no drainage, educated patient to call surgeons office if ANYTHING changes 7. Have your bowels moved since your surgery?  If not, are you passing gas?  No, yes 8. Are you up and walking 3-4 times per day?  yes    1. Do you have an appointment made to see your surgeon in the next month?  yes 2. Were you provided your discharge medications before your surgery or before you were discharged from the hospital and are you taking them without problem?  yes 3. Were you provided phone numbers to the clinic/surgeon's office?  yes 4. Did you watch the patient education video module in the (clinic, surgeon's office, etc.) before your surgery? yes 5. Do you have a discharge checklist that was provided to you in the hospital to reference with instructions on how to take care of yourself after surgery?  yes 6. Did you see a dietitian or nutritional counselor while you were in the hospital?  yes 7. Do you have an appointment to see a dietitian or nutritional counselor in the next month? Yes   Discussed with patient s/s of wound infection, educated to call [facility]  immediately if he starts to run a fever, has drainage from incisions or if anything changes.  Counseled to ambulate frequently throughout the day to avoid risk of DVT/PE

## 2014-11-20 ENCOUNTER — Ambulatory Visit: Payer: Self-pay | Admitting: Dietician

## 2014-11-25 ENCOUNTER — Emergency Department (HOSPITAL_COMMUNITY)
Admission: EM | Admit: 2014-11-25 | Discharge: 2014-11-25 | Disposition: A | Discharge status: Home / Self Care | Payer: Medicare Other | Source: Home / Self Care | Attending: Emergency Medicine | Admitting: Emergency Medicine

## 2014-11-25 ENCOUNTER — Encounter (HOSPITAL_COMMUNITY): Payer: Self-pay | Admitting: Nurse Practitioner

## 2014-11-25 ENCOUNTER — Emergency Department (HOSPITAL_COMMUNITY): Payer: Medicare Other

## 2014-11-25 DIAGNOSIS — Z9884 Bariatric surgery status: Secondary | ICD-10-CM

## 2014-11-25 DIAGNOSIS — N186 End stage renal disease: Secondary | ICD-10-CM | POA: Insufficient documentation

## 2014-11-25 DIAGNOSIS — E860 Dehydration: Secondary | ICD-10-CM | POA: Diagnosis present

## 2014-11-25 DIAGNOSIS — Z79899 Other long term (current) drug therapy: Secondary | ICD-10-CM

## 2014-11-25 DIAGNOSIS — K219 Gastro-esophageal reflux disease without esophagitis: Secondary | ICD-10-CM | POA: Insufficient documentation

## 2014-11-25 DIAGNOSIS — R109 Unspecified abdominal pain: Secondary | ICD-10-CM

## 2014-11-25 DIAGNOSIS — I120 Hypertensive chronic kidney disease with stage 5 chronic kidney disease or end stage renal disease: Secondary | ICD-10-CM | POA: Diagnosis present

## 2014-11-25 DIAGNOSIS — Z992 Dependence on renal dialysis: Secondary | ICD-10-CM

## 2014-11-25 DIAGNOSIS — N2581 Secondary hyperparathyroidism of renal origin: Secondary | ICD-10-CM | POA: Diagnosis present

## 2014-11-25 DIAGNOSIS — K9509 Other complications of gastric band procedure: Secondary | ICD-10-CM | POA: Diagnosis not present

## 2014-11-25 DIAGNOSIS — F17210 Nicotine dependence, cigarettes, uncomplicated: Secondary | ICD-10-CM | POA: Diagnosis present

## 2014-11-25 DIAGNOSIS — R1013 Epigastric pain: Secondary | ICD-10-CM

## 2014-11-25 DIAGNOSIS — Z8739 Personal history of other diseases of the musculoskeletal system and connective tissue: Secondary | ICD-10-CM

## 2014-11-25 DIAGNOSIS — Z6841 Body Mass Index (BMI) 40.0 and over, adult: Secondary | ICD-10-CM

## 2014-11-25 DIAGNOSIS — R1012 Left upper quadrant pain: Secondary | ICD-10-CM | POA: Diagnosis not present

## 2014-11-25 DIAGNOSIS — M1990 Unspecified osteoarthritis, unspecified site: Secondary | ICD-10-CM | POA: Diagnosis present

## 2014-11-25 DIAGNOSIS — D649 Anemia, unspecified: Secondary | ICD-10-CM | POA: Diagnosis present

## 2014-11-25 DIAGNOSIS — Y831 Surgical operation with implant of artificial internal device as the cause of abnormal reaction of the patient, or of later complication, without mention of misadventure at the time of the procedure: Secondary | ICD-10-CM | POA: Diagnosis present

## 2014-11-25 HISTORY — DX: Disorder of kidney and ureter, unspecified: N28.9

## 2014-11-25 LAB — CBC WITH DIFFERENTIAL/PLATELET
Basophils Absolute: 0 10*3/uL (ref 0.0–0.1)
Basophils Relative: 0 % (ref 0–1)
EOS ABS: 0 10*3/uL (ref 0.0–0.7)
Eosinophils Relative: 0 % (ref 0–5)
HCT: 37.8 % — ABNORMAL LOW (ref 39.0–52.0)
Hemoglobin: 12.6 g/dL — ABNORMAL LOW (ref 13.0–17.0)
Lymphocytes Relative: 11 % — ABNORMAL LOW (ref 12–46)
Lymphs Abs: 1 10*3/uL (ref 0.7–4.0)
MCH: 31 pg (ref 26.0–34.0)
MCHC: 33.3 g/dL (ref 30.0–36.0)
MCV: 93.1 fL (ref 78.0–100.0)
Monocytes Absolute: 0.6 10*3/uL (ref 0.1–1.0)
Monocytes Relative: 7 % (ref 3–12)
NEUTROS ABS: 7.9 10*3/uL — AB (ref 1.7–7.7)
NEUTROS PCT: 82 % — AB (ref 43–77)
Platelets: 209 10*3/uL (ref 150–400)
RBC: 4.06 MIL/uL — AB (ref 4.22–5.81)
RDW: 13.3 % (ref 11.5–15.5)
WBC: 9.6 10*3/uL (ref 4.0–10.5)

## 2014-11-25 LAB — COMPREHENSIVE METABOLIC PANEL
ALT: 11 U/L (ref 0–53)
AST: 18 U/L (ref 0–37)
Albumin: 3.8 g/dL (ref 3.5–5.2)
Alkaline Phosphatase: 54 U/L (ref 39–117)
Anion gap: 18 — ABNORMAL HIGH (ref 5–15)
BILIRUBIN TOTAL: 0.8 mg/dL (ref 0.3–1.2)
BUN: 29 mg/dL — AB (ref 6–23)
CALCIUM: 7.8 mg/dL — AB (ref 8.4–10.5)
CHLORIDE: 97 mmol/L (ref 96–112)
CO2: 21 mmol/L (ref 19–32)
Creatinine, Ser: 12.35 mg/dL — ABNORMAL HIGH (ref 0.50–1.35)
GFR calc Af Amer: 6 mL/min — ABNORMAL LOW (ref >90)
GFR calc non Af Amer: 5 mL/min — ABNORMAL LOW (ref >90)
Glucose, Bld: 95 mg/dL (ref 70–99)
Potassium: 4 mmol/L (ref 3.5–5.1)
SODIUM: 136 mmol/L (ref 135–145)
Total Protein: 7.6 g/dL (ref 6.0–8.3)

## 2014-11-25 LAB — LIPASE, BLOOD: LIPASE: 47 U/L (ref 11–59)

## 2014-11-25 LAB — I-STAT CG4 LACTIC ACID, ED: LACTIC ACID, VENOUS: 1.97 mmol/L (ref 0.5–2.0)

## 2014-11-25 MED ORDER — ONDANSETRON HCL 4 MG/2ML IJ SOLN
4.0000 mg | Freq: Once | INTRAMUSCULAR | Status: AC
  Administered 2014-11-25: 4 mg via INTRAVENOUS
  Filled 2014-11-25: qty 2

## 2014-11-25 MED ORDER — ONDANSETRON 4 MG PO TBDP: 4.0000 mg | ORAL_TABLET | Freq: Three times a day (TID) | ORAL | Status: DC | PRN

## 2014-11-25 MED ORDER — KETOROLAC TROMETHAMINE 30 MG/ML IJ SOLN
30.0000 mg | Freq: Once | INTRAMUSCULAR | Status: AC
  Administered 2014-11-25: 30 mg via INTRAVENOUS
  Filled 2014-11-25: qty 1

## 2014-11-25 MED ORDER — ONDANSETRON 4 MG PO TBDP
8.0000 mg | ORAL_TABLET | Freq: Once | ORAL | Status: AC
  Administered 2014-11-25: 8 mg via ORAL

## 2014-11-25 MED ORDER — MORPHINE SULFATE 4 MG/ML IJ SOLN
4.0000 mg | Freq: Once | INTRAMUSCULAR | Status: AC
  Administered 2014-11-25: 4 mg via INTRAVENOUS
  Filled 2014-11-25: qty 1

## 2014-11-25 MED ORDER — IOHEXOL 300 MG/ML  SOLN
100.0000 mL | Freq: Once | INTRAMUSCULAR | Status: AC | PRN
  Administered 2014-11-25: 100 mL via INTRAVENOUS

## 2014-11-25 NOTE — ED Notes (Signed)
Pt states he does not urinate since he is on dialysis.

## 2014-11-25 NOTE — Discharge Instructions (Signed)
Abdominal Pain °Many things can cause abdominal pain. Usually, abdominal pain is not caused by a disease and will improve without treatment. It can often be observed and treated at home. Your health care provider will do a physical exam and possibly order blood tests and X-rays to help determine the seriousness of your pain. However, in many cases, more time must pass before a clear cause of the pain can be found. Before that point, your health care provider may not know if you need more testing or further treatment. °HOME CARE INSTRUCTIONS  °Monitor your abdominal pain for any changes. The following actions may help to alleviate any discomfort you are experiencing: °· Only take over-the-counter or prescription medicines as directed by your health care provider. °· Do not take laxatives unless directed to do so by your health care provider. °· Try a clear liquid diet (broth, tea, or water) as directed by your health care provider. Slowly move to a bland diet as tolerated. °SEEK MEDICAL CARE IF: °· You have unexplained abdominal pain. °· You have abdominal pain associated with nausea or diarrhea. °· You have pain when you urinate or have a bowel movement. °· You experience abdominal pain that wakes you in the night. °· You have abdominal pain that is worsened or improved by eating food. °· You have abdominal pain that is worsened with eating fatty foods. °· You have a fever. °SEEK IMMEDIATE MEDICAL CARE IF:  °· Your pain does not go away within 2 hours. °· You keep throwing up (vomiting). °· Your pain is felt only in portions of the abdomen, such as the right side or the left lower portion of the abdomen. °· You pass bloody or black tarry stools. °MAKE SURE YOU: °· Understand these instructions.   °· Will watch your condition.   °· Will get help right away if you are not doing well or get worse.   °Document Released: 04/29/2005 Document Revised: 07/25/2013 Document Reviewed: 03/29/2013 °ExitCare® Patient Information  ©2015 [employer]. This information is not intended to replace advice given to you by your health care provider. Make sure you discuss any questions you have with your health care provider. ° °Nausea and Vomiting °Nausea is a sick feeling that often comes before throwing up (vomiting). Vomiting is a reflex where stomach contents come out of your mouth. Vomiting can cause severe loss of body fluids (dehydration). Children and elderly adults can become dehydrated quickly, especially if they also have diarrhea. Nausea and vomiting are symptoms of a condition or disease. It is important to find the cause of your symptoms. °CAUSES  °· Direct irritation of the stomach lining. This irritation can result from increased acid production (gastroesophageal reflux disease), infection, food poisoning, taking certain medicines (such as nonsteroidal anti-inflammatory drugs), alcohol use, or tobacco use. °· Signals from the brain. These signals could be caused by a headache, heat exposure, an inner ear disturbance, increased pressure in the brain from injury, infection, a tumor, or a concussion, pain, emotional stimulus, or metabolic problems. °· An obstruction in the gastrointestinal tract (bowel obstruction). °· Illnesses such as diabetes, hepatitis, gallbladder problems, appendicitis, kidney problems, cancer, sepsis, atypical symptoms of a heart attack, or eating disorders. °· Medical treatments such as chemotherapy and radiation. °· Receiving medicine that makes you sleep (general anesthetic) during surgery. °DIAGNOSIS °Your caregiver may ask for tests to be done if the problems do not improve after a few days. Tests may also be done if symptoms are severe or if the reason for the nausea   and vomiting is not clear. Tests may include: °· Urine tests. °· Blood tests. °· Stool tests. °· Cultures (to look for evidence of infection). °· X-rays or other imaging studies. °Test results can help your caregiver make decisions about  treatment or the need for additional tests. °TREATMENT °You need to stay well hydrated. Drink frequently but in small amounts. You may wish to drink water, sports drinks, clear broth, or eat frozen ice pops or gelatin dessert to help stay hydrated. When you eat, eating slowly may help prevent nausea. There are also some antinausea medicines that may help prevent nausea. °HOME CARE INSTRUCTIONS  °· Take all medicine as directed by your caregiver. °· If you do not have an appetite, do not force yourself to eat. However, you must continue to drink fluids. °· If you have an appetite, eat a normal diet unless your caregiver tells you differently. °¨ Eat a variety of complex carbohydrates (rice, wheat, potatoes, bread), lean meats, yogurt, fruits, and vegetables. °¨ Avoid high-fat foods because they are more difficult to digest. °· Drink enough water and fluids to keep your urine clear or pale yellow. °· If you are dehydrated, ask your caregiver for specific rehydration instructions. Signs of dehydration may include: °¨ Severe thirst. °¨ Dry lips and mouth. °¨ Dizziness. °¨ Dark urine. °¨ Decreasing urine frequency and amount. °¨ Confusion. °¨ Rapid breathing or pulse. °SEEK IMMEDIATE MEDICAL CARE IF:  °· You have blood or brown flecks (like coffee grounds) in your vomit. °· You have black or bloody stools. °· You have a severe headache or stiff neck. °· You are confused. °· You have severe abdominal pain. °· You have chest pain or trouble breathing. °· You do not urinate at least once every 8 hours. °· You develop cold or clammy skin. °· You continue to vomit for longer than 24 to 48 hours. °· You have a fever. °MAKE SURE YOU:  °· Understand these instructions. °· Will watch your condition. °· Will get help right away if you are not doing well or get worse. °Document Released: 07/20/2005 Document Revised: 10/12/2011 Document Reviewed: 12/17/2010 °ExitCare® Patient Information ©2015 [employer]. This information is not  intended to replace advice given to you by your health care provider. Make sure you discuss any questions you have with your health care provider. ° °

## 2014-11-25 NOTE — ED Notes (Addendum)
Pt is a dialysis pt (M, W, F) -- severe abd pain-- epigastric and diffuse abd.

## 2014-11-25 NOTE — ED Notes (Addendum)
Pt DOES NOT make any urine for u/a. Incisions from lap/band clean and dry

## 2014-11-25 NOTE — Progress Notes (Signed)
Patient ID: [name], male   DOB: 10/02/1985, 29 y.o.   MRN: [id number]  He is status post a laparoscopic band insertion by Dr. Lucia Gaskins on 4/11.  Because of pain, he had a CAT scan performed on 4/14 which was unremarkable. He talked to Dr. Lucia Gaskins today because of recurrent pain with nausea and vomiting and was told to come to the emergency department. He still reports some sharp pain in the abdomen. On exam, his abdomen is completely soft and not appreciably tender. The incisions are well-healed. His white blood count is normal. We ordered another CAT scan with contrast that shows no obstruction whatsoever. There are no inflammatory changes around the band and no evidence of bowel injury or an explanation for his symptoms. I discussed this with the patient and with Dr. Lucia Gaskins by phone. I believe he just needs to continue to take minimal liquids in small amounts. He will be seeing Dr. Lucia Gaskins this week in the office for follow-up. Again, I do not believe he needs admission and hopefully should improve.

## 2014-11-25 NOTE — ED Notes (Signed)
He c/o L sided abd pain, n/v since yesterday. He denies any bowel/bladder changes. He had lap band surgery on 4/11.

## 2014-11-25 NOTE — ED Provider Notes (Signed)
CSN: [id number]     Arrival date & time 11/25/14  1226 History   First MD Initiated Contact with Patient 11/25/14 1420     Chief Complaint  Patient presents with  . Abdominal Pain   [name] is a 29 y.o. morbidly obese male with a history of end-stage renal disease on dialysis who presents to the emergency department complaining of severe epigastric abdominal pain ongoing since yesterday associated with nausea and vomiting. Patient reports he had lap band surgery on 11/12/2014 by Dr. Lucia Gaskins. He is complaining of 10 out of 10 sharp epigastric pain. He reports he has vomited 3 times today. He reports he is passing small amounts of gas and his last bowel movement was 3 days. He is on Monday, Wednesday, Friday schedule for dialysis and his last dialysis was Friday, or 2 days ago. Patient makes no urine. The patient denies fevers, hematemesis, or hematochezia.    (Consider location/radiation/quality/duration/timing/severity/associated sxs/prior Treatment) HPI  Past Medical History  Diagnosis Date  . Hyperparathyroidism   . Morbid obesity   . Arthritis   . Complication of anesthesia     A little while to wake up after knee surgery in 2008  . Family history of anesthesia complication     mom slow to wake up  . Headache(784.0)     occasionally  . Dizziness     when coming off of dialysis  . Joint pain   . Joint swelling   . GERD (gastroesophageal reflux disease)     takes Omeprazole as needed  . Hypertension   . Renal disorder     MWF and goes to [facility]  . Renal insufficiency    Past Surgical History  Procedure Laterality Date  . Av fistula placement Bilateral     Uses Right arm  . Esophagogastroduodenoscopy endoscopy    . Knee arthroscopy Left 2007  . Parathyroidectomy N/A 03/30/2013    Procedure: TOTAL PARATHYROIDECTOMY WITH AUTOTRANSPLANT;  Surgeon: Earnstine Regal, MD;  Location: [place];  Service: General;  Laterality: N/A;  Autotransplant to left lower arm.  . Total knee  arthroplasty Right 03/27/2014    DR Marlou Sa  . Total knee arthroplasty Right 03/27/2014    Procedure: UNICOMPARTMENTAL ARTHROPLASTY;  Surgeon: Meredith Pel, MD;  Location: [place];  Service: Orthopedics;  Laterality: Right;  . Breath tek h pylori N/A 05/15/2014    Procedure: BREATH TEK H PYLORI;  Surgeon: Alphonsa Overall, MD;  Location: [facility];  Service: General;  Laterality: N/A;  . Laparoscopic gastric banding N/A 11/12/2014    Procedure: LAPAROSCOPIC GASTRIC BANDING;  Surgeon: Alphonsa Overall, MD;  Location: [facility];  Service: General;  Laterality: N/A;   History reviewed. No pertinent family history. History  Substance Use Topics  . Smoking status: Current Some Day Smoker -- 0.20 packs/day for 13 years    Types: Cigarettes  . Smokeless tobacco: Never Used     Comment: 2 cigs a day  . Alcohol Use: No    Review of Systems  Constitutional: Negative for fever.  HENT: Negative for congestion and sore throat.   Eyes: Negative for visual disturbance.  Respiratory: Negative for cough, shortness of breath and wheezing.   Cardiovascular: Negative for chest pain and palpitations.  Gastrointestinal: Positive for nausea, vomiting and abdominal pain. Negative for diarrhea and blood in stool.  Genitourinary: Negative for dysuria.  Musculoskeletal: Negative for back pain and neck pain.  Skin: Negative for rash.  Neurological: Negative for headaches.  Allergies  Review of patient's allergies indicates no known allergies.  Home Medications   Prior to Admission medications   Medication Sig Start Date End Date Taking? Authorizing Provider  omeprazole (PRILOSEC) 20 MG capsule Take 20 mg by mouth daily as needed (for heartburn).    Yes Historical Provider, MD  oxyCODONE (OXY IR/ROXICODONE) 5 MG immediate release tablet Take 5-10 mg by mouth every 3 (three) hours as needed for moderate pain or breakthrough pain. 03/29/14  Yes Scott Marcene Duos, MD  sevelamer carbonate (RENVELA) 2.4 G PACK  Take 2.4 g by mouth 3 (three) times daily with meals.   Yes Historical Provider, MD  ondansetron (ZOFRAN ODT) 4 MG disintegrating tablet Take 1 tablet (4 mg total) by mouth every 8 (eight) hours as needed for nausea or vomiting. 11/25/14   Waynetta Pean, [name]   BP 116/78 mmHg  Pulse 83  Temp(Src) 98.2 F (36.8 C) (Oral)  Resp 15  Ht 5\' 6"  (1.676 m)  Wt 290 lb (131.543 kg)  BMI 46.83 kg/m2  SpO2 95% Physical Exam  Constitutional: He is oriented to person, place, and time. He appears well-developed and well-nourished. No distress.  Morbidly obese male.  HENT:  Head: Normocephalic and atraumatic.  Mouth/Throat: Oropharynx is clear and moist. No oropharyngeal exudate.  Eyes: Conjunctivae are normal. Pupils are equal, round, and reactive to light. Right eye exhibits no discharge. Left eye exhibits no discharge.  Neck: Neck supple.  Cardiovascular: Normal rate, regular rhythm, normal heart sounds and intact distal pulses.  Exam reveals no gallop and no friction rub.   No murmur heard. Pulmonary/Chest: Effort normal and breath sounds normal. No respiratory distress. He has no wheezes. He has no rales.  Abdominal: Soft. Bowel sounds are normal. There is tenderness. There is guarding.  Patient has severe epigastric abdominal tenderness with guarding. No masses appreciated. Patient is morbidly obese. Bowel sounds are present. Surgical wounds clean and dry from surgery. No erythema or discharge.   Musculoskeletal: He exhibits no edema.  Lymphadenopathy:    He has no cervical adenopathy.  Neurological: He is alert and oriented to person, place, and time. Coordination normal.  Skin: Skin is warm and dry. No rash noted. He is not diaphoretic. No erythema. No pallor.  Psychiatric: He has a normal mood and affect. His behavior is normal.  Nursing note and vitals reviewed.   ED Course  Procedures (including critical care time) Labs Review Labs Reviewed  CBC WITH DIFFERENTIAL/PLATELET - Abnormal;  Notable for the following:    RBC 4.06 (*)    Hemoglobin 12.6 (*)    HCT 37.8 (*)    Neutrophils Relative % 82 (*)    Neutro Abs 7.9 (*)    Lymphocytes Relative 11 (*)    All other components within normal limits  COMPREHENSIVE METABOLIC PANEL - Abnormal; Notable for the following:    BUN 29 (*)    Creatinine, Ser 12.35 (*)    Calcium 7.8 (*)    GFR calc non Af Amer 5 (*)    GFR calc Af Amer 6 (*)    Anion gap 18 (*)    All other components within normal limits  LIPASE, BLOOD  I-STAT CG4 LACTIC ACID, ED    Imaging Review Ct Abdomen Pelvis W Contrast  11/25/2014   CLINICAL DATA:  Left upper and lower quadrant abdominal pain associated with nausea and vomiting since yesterday. Prior laparoscopic banding. Current history of end- stage renal disease on hemodialysis.  EXAM: CT ABDOMEN AND PELVIS  WITH CONTRAST  TECHNIQUE: Multidetector CT imaging of the abdomen and pelvis was performed using the standard protocol following bolus administration of intravenous contrast.  CONTRAST:  17mL OMNIPAQUE IOHEXOL 300 MG/ML IV. Oral contrast was also administered.  COMPARISON:  11/15/2014, 04/09/2010.  FINDINGS: Liver: Mild diffuse hepatic steatosis without focal hepatic parenchymal abnormality.  Biliary: Normal-appearing gallbladder without calcified gallstones. No biliary ductal dilation.  Spleen:  Normal in size and appearance.  Pancreas:  Normal in appearance.  No pancreatic ductal dilation.  Adrenal glands:  Normal in appearance.  Genitourinary: Atrophic kidneys containing numerous cysts and scattered cortical calcifications. Largest cyst approximates 3.8 cm arising from the upper pole of the left kidney. Urinary bladder completely decompressed as the patient does not produce urine.  Prostate gland and seminal vesicles normal in size and appearance for age.  Gastrointestinal: Laparoscopic band appropriately positioned, unchanged from the CT 2 Delay ago, and there is no discontinuity of the catheter  extending from the band to the port. Normal-appearing small bowel and colon. Normal appendix in the right mid abdomen as the cecum is positioned in the right upper quadrant.  Vascular:  No visible aortoiliofemoral atherosclerosis.  Lymphatic:  No pathologic lymphadenopathy in the abdomen or pelvis.  Ascites: Absent.  Musculoskeletal: Mild degenerative changes involving the lower thoracic spine. Renal osteodystrophy.  Other findings: Minimal ecchymosis or possible postoperative fat necrosis at the port site in the subcutaneous tissues of the right abdominal wall. Increased attenuation in the anterior abdominal wall of the right upper abdomen, well above the port, interspersed with fat.  Visualized lower thorax: Heart size normal.  Lung bases clear.  IMPRESSION: 1. No acute abnormalities involving the abdomen or pelvis. 2. No visible complications involving the laparoscopic band or its components. 3. Mild diffuse hepatic steatosis. 4. Increased attenuation in the subcutaneous fat of the right upper abdominal wall, present on the examination 10 days ago and unchanged. Does the patient take subcutaneous injections? If not, focal fat necrosis is suspected.   Electronically Signed   By: Evangeline Dakin M.D.   On: 11/25/2014 16:54     EKG Interpretation   Date/Time:  Sunday November 25 2014 12:31:10 EDT Ventricular Rate:  91 PR Interval:  164 QRS Duration: 102 QT Interval:  348 QTC Calculation: 428 R Axis:   29 Text Interpretation:  Normal sinus rhythm Normal ECG Confirmed by BEATON   MD, ROBERT ([id number]) on 11/25/2014 2:36:55 PM      Filed Vitals:   11/25/14 1530 11/25/14 1600 11/25/14 1635 11/25/14 1700  BP: 114/67 109/62 121/80 116/78  Pulse: 88 84 89 83  Temp:      TempSrc:      Resp: 21 17 14 15   Height:      Weight:      SpO2: 96% 97% 100% 95%     MDM   Meds given in ED:  Medications  ketorolac (TORADOL) 30 MG/ML injection 30 mg (not administered)  ondansetron (ZOFRAN-ODT) disintegrating  tablet 8 mg (8 mg Oral Given 11/25/14 1244)  ondansetron (ZOFRAN) injection 4 mg (4 mg Intravenous Given 11/25/14 1452)  morphine 4 MG/ML injection 4 mg (4 mg Intravenous Given 11/25/14 1453)  iohexol (OMNIPAQUE) 300 MG/ML solution 100 mL (100 mLs Intravenous Contrast Given 11/25/14 1617)    New Prescriptions   ONDANSETRON (ZOFRAN ODT) 4 MG DISINTEGRATING TABLET    Take 1 tablet (4 mg total) by mouth every 8 (eight) hours as needed for nausea or vomiting.    Final diagnoses:  Epigastric abdominal  pain   This is a 29 y.o. morbidly obese male with a history of end-stage renal disease on dialysis who presents to the emergency department complaining of severe epigastric abdominal pain ongoing since yesterday associated with nausea and vomiting. Patient reports he had lap band surgery on 11/12/2014 by Dr. Lucia Gaskins. He is complaining of 10 out of 10 sharp epigastric pain. He reports he has vomited 3 times today.  On exam the patient has severe epigastric tenderness to palpation. No masses or lower abdominal tenderness appreciated.  He has a normal lactic acid. His CBC is unremarkable for him. CMP is consistent with a patient with end-stage renal disease. Dr. Audie Pinto discussed this patient with general surgeon Dr. Ninfa Linden. Dr. Ninfa Linden suggested the patient have a CT of his abdomen with oral contrast only. The patient did not tolerate oral contrast well. After discussing with Dr. Vanita Panda was decided we would do a CT with IV contrast.    CT scan returned showing no acute abnormalities in the abdomen or pelvis. There are no visible complications involving the laparoscopic band or its components. Dr. Ninfa Linden also came down and saw the patient and feels he is okay to be discharged. The patient reports feeling better and is ready to be discharged. We'll discharge the patient with prescription for Zofran and have him follow-up with his general surgeon this week as planned. Strict return precautions provided. I  advised the patient to follow-up with their primary care provider this week. I advised the patient to return to the emergency department with new or worsening symptoms or new concerns. The patient verbalized understanding and agreement with plan.    Waynetta Pean, [name] 11/25/14 1714  Leonard Schwartz, MD 11/28/14 1016

## 2014-11-27 ENCOUNTER — Emergency Department (HOSPITAL_COMMUNITY): Payer: Medicare Other

## 2014-11-27 ENCOUNTER — Encounter (HOSPITAL_COMMUNITY): Payer: Self-pay | Admitting: *Deleted

## 2014-11-27 ENCOUNTER — Encounter: Payer: Self-pay | Admitting: Surgery

## 2014-11-27 ENCOUNTER — Ambulatory Visit: Payer: Self-pay

## 2014-11-27 ENCOUNTER — Inpatient Hospital Stay (HOSPITAL_COMMUNITY)
Admission: EM | Admit: 2014-11-27 | Discharge: 2014-12-01 | DRG: 393 | Disposition: A | Discharge status: Home / Self Care | Payer: Medicare Other | Attending: Family Medicine | Admitting: Family Medicine

## 2014-11-27 DIAGNOSIS — R1012 Left upper quadrant pain: Secondary | ICD-10-CM

## 2014-11-27 DIAGNOSIS — R109 Unspecified abdominal pain: Secondary | ICD-10-CM | POA: Diagnosis present

## 2014-11-27 DIAGNOSIS — R112 Nausea with vomiting, unspecified: Secondary | ICD-10-CM

## 2014-11-27 DIAGNOSIS — Z992 Dependence on renal dialysis: Secondary | ICD-10-CM

## 2014-11-27 DIAGNOSIS — Z9884 Bariatric surgery status: Secondary | ICD-10-CM

## 2014-11-27 DIAGNOSIS — N186 End stage renal disease: Secondary | ICD-10-CM | POA: Diagnosis present

## 2014-11-27 LAB — COMPREHENSIVE METABOLIC PANEL
ALK PHOS: 56 U/L (ref 39–117)
ALT: 13 U/L (ref 0–53)
AST: 19 U/L (ref 0–37)
Albumin: 4.1 g/dL (ref 3.5–5.2)
Anion gap: 21 — ABNORMAL HIGH (ref 5–15)
BUN: 21 mg/dL (ref 6–23)
CALCIUM: 9.1 mg/dL (ref 8.4–10.5)
CHLORIDE: 96 mmol/L (ref 96–112)
CO2: 19 mmol/L (ref 19–32)
Creatinine, Ser: 10.48 mg/dL — ABNORMAL HIGH (ref 0.50–1.35)
GFR calc Af Amer: 7 mL/min — ABNORMAL LOW (ref >90)
GFR, EST NON AFRICAN AMERICAN: 6 mL/min — AB (ref >90)
Glucose, Bld: 88 mg/dL (ref 70–99)
Potassium: 4.4 mmol/L (ref 3.5–5.1)
SODIUM: 136 mmol/L (ref 135–145)
Total Bilirubin: 0.8 mg/dL (ref 0.3–1.2)
Total Protein: 7.8 g/dL (ref 6.0–8.3)

## 2014-11-27 LAB — CBC WITH DIFFERENTIAL/PLATELET
Basophils Absolute: 0 10*3/uL (ref 0.0–0.1)
Basophils Relative: 0 % (ref 0–1)
Eosinophils Absolute: 0 10*3/uL (ref 0.0–0.7)
Eosinophils Relative: 1 % (ref 0–5)
HCT: 40.7 % (ref 39.0–52.0)
Hemoglobin: 13.6 g/dL (ref 13.0–17.0)
LYMPHS ABS: 1.1 10*3/uL (ref 0.7–4.0)
Lymphocytes Relative: 14 % (ref 12–46)
MCH: 31 pg (ref 26.0–34.0)
MCHC: 33.4 g/dL (ref 30.0–36.0)
MCV: 92.7 fL (ref 78.0–100.0)
MONOS PCT: 9 % (ref 3–12)
Monocytes Absolute: 0.7 10*3/uL (ref 0.1–1.0)
NEUTROS PCT: 76 % (ref 43–77)
Neutro Abs: 6 10*3/uL (ref 1.7–7.7)
Platelets: 211 10*3/uL (ref 150–400)
RBC: 4.39 MIL/uL (ref 4.22–5.81)
RDW: 13.2 % (ref 11.5–15.5)
WBC: 7.9 10*3/uL (ref 4.0–10.5)

## 2014-11-27 LAB — LIPASE, BLOOD: LIPASE: 61 U/L — AB (ref 11–59)

## 2014-11-27 MED ORDER — SODIUM CHLORIDE 0.9 % IV BOLUS (SEPSIS)
500.0000 mL | Freq: Once | INTRAVENOUS | Status: AC
  Administered 2014-11-27: 500 mL via INTRAVENOUS

## 2014-11-27 MED ORDER — ONDANSETRON HCL 4 MG PO TABS: 4.0000 mg | ORAL_TABLET | Freq: Four times a day (QID) | ORAL | Status: DC | PRN

## 2014-11-27 MED ORDER — MORPHINE SULFATE 2 MG/ML IJ SOLN
2.0000 mg | INTRAMUSCULAR | Status: DC | PRN
  Administered 2014-11-28 (×4): 2 mg via INTRAVENOUS
  Administered 2014-11-28 – 2014-11-29 (×3): 4 mg via INTRAVENOUS
  Administered 2014-11-29: 3 mg via INTRAVENOUS
  Administered 2014-11-29 – 2014-11-30 (×3): 4 mg via INTRAVENOUS
  Administered 2014-11-30: 2 mg via INTRAVENOUS
  Administered 2014-11-30 – 2014-12-01 (×3): 4 mg via INTRAVENOUS
  Filled 2014-11-27: qty 2
  Filled 2014-11-27: qty 1
  Filled 2014-11-27 (×4): qty 2
  Filled 2014-11-27 (×2): qty 1
  Filled 2014-11-27 (×2): qty 2
  Filled 2014-11-27: qty 1
  Filled 2014-11-27: qty 2
  Filled 2014-11-27: qty 1
  Filled 2014-11-27: qty 2
  Filled 2014-11-27 (×2): qty 1

## 2014-11-27 MED ORDER — HYDROMORPHONE HCL 1 MG/ML IJ SOLN
1.0000 mg | Freq: Once | INTRAMUSCULAR | Status: AC
  Administered 2014-11-27: 1 mg via INTRAVENOUS
  Filled 2014-11-27: qty 1

## 2014-11-27 MED ORDER — HEPARIN SODIUM (PORCINE) 5000 UNIT/ML IJ SOLN
5000.0000 [IU] | Freq: Three times a day (TID) | INTRAMUSCULAR | Status: DC
  Administered 2014-11-28 – 2014-12-01 (×10): 5000 [IU] via SUBCUTANEOUS
  Filled 2014-11-27 (×12): qty 1

## 2014-11-27 MED ORDER — SODIUM CHLORIDE 0.9 % IV SOLN
INTRAVENOUS | Status: DC
  Administered 2014-11-27 – 2014-11-29 (×4): via INTRAVENOUS

## 2014-11-27 MED ORDER — ONDANSETRON HCL 4 MG/2ML IJ SOLN
4.0000 mg | Freq: Four times a day (QID) | INTRAMUSCULAR | Status: DC | PRN
  Administered 2014-11-28 – 2014-12-01 (×7): 4 mg via INTRAVENOUS
  Filled 2014-11-27 (×7): qty 2

## 2014-11-27 MED ORDER — ONDANSETRON 4 MG PO TBDP: 4.0000 mg | ORAL_TABLET | Freq: Three times a day (TID) | ORAL | Status: DC | PRN

## 2014-11-27 MED ORDER — ONDANSETRON HCL 4 MG/2ML IJ SOLN
4.0000 mg | Freq: Once | INTRAMUSCULAR | Status: AC
  Administered 2014-11-27: 4 mg via INTRAVENOUS
  Filled 2014-11-27: qty 2

## 2014-11-27 NOTE — ED Notes (Signed)
Pt states he's a dialysis pt and doesn't make urine.

## 2014-11-27 NOTE — ED Notes (Signed)
Pt sent by dr Hassell Done from [facility] [place] for evaluation of port around lap band. Pt reports pain and n/v. Pt is dialysis MWF pt as well.

## 2014-11-27 NOTE — H&P (Addendum)
Hospitalist Admission History and Physical  Patient name: [name] Medical record number: [id number] Date of birth: [birth year] Age: 29 y.o. Gender: male  Primary Care Provider: Philis Fendt, MD  Chief Complaint: Abd Pain  History of Present Illness:This is a 29 y.o. year old male with significant past medical history of morbid obesity s/p gastric banding 11/12/14, ESRD on HD MWF, GERD, hyperparathyroidism  presenting with abd pain, nausea, vomiting. Patient reports recurrent abdominal pain and uncontrolled nausea and vomiting over the past 2-3 days. Patient noted to have had a gastric band placed approximately 2 Gioffre ago by general surgery. Has been unable to tolerate by mouth intake. Recurrent episodes of nausea and vomiting. No diarrhea and no fevers or chills. Was seen by general surgery earlier today and was redirected to the ER out of concern for possible complications of patient's gastric band. Emesis nonbloody nonbilious. Dialysis was yesterday without complications. Presented to the ER afebrile, hemodynamically stable. White blood cell count 7.9, hemoglobin 13.6, creatinine 10.48. CT of the abdomen and pelvis on 424 negative for any acute abnormalities. Stable laparoscopic band. Stable repaired hiatal hernia. ER physician assistant [name] discuss case on call surgery the recommended medical admission for rehydration and symptomatically management. Formal consult pending. Assessment and Plan: [name] is a 29 y.o. year old male presenting with abd pain   Active Problems:   Abdominal pain   1- Abd Pain/Nausea/Vomiting -likely secondary to complications from gastric band placement -pending formal general surgery consult-likely in am -noted mildly elevated lipase @ 61 -fairly reassuring exam -defer CT imaging unless pain significantly worsens -follow -consider reimaging and formal bedside surgery consult if pain significantly worsens overnight  2- ESRD -s/p HD  yesterday -noted recurrent nausea and vomiting  -gently hydrate pt -renal c/s in am-due for HD 4/27  FEN/GI: NPO. PPI  Prophylaxis: sub q heparin  Disposition: pending further evaluation  Code Status:Full Code    Patient Active Problem List   Diagnosis Date Noted  . Lapband April 2016 11/27/2014  . Abdominal pain 11/27/2014  . Arthritis of knee 03/27/2014  . Morbid obesity, weight 291, BMI - 47 02/15/2014  . End stage renal disease 07/12/2013  . Hyperparathyroidism, secondary 03/14/2013   Past Medical History: Past Medical History  Diagnosis Date  . Hyperparathyroidism   . Morbid obesity   . Arthritis   . Complication of anesthesia     A little while to wake up after knee surgery in 2008  . Family history of anesthesia complication     mom slow to wake up  . Headache(784.0)     occasionally  . Dizziness     when coming off of dialysis  . Joint pain   . Joint swelling   . GERD (gastroesophageal reflux disease)     takes Omeprazole as needed  . Hypertension   . Renal disorder     MWF and goes to [facility]  . Renal insufficiency     Past Surgical History: Past Surgical History  Procedure Laterality Date  . Av fistula placement Bilateral     Uses Right arm  . Esophagogastroduodenoscopy endoscopy    . Knee arthroscopy Left 2007  . Parathyroidectomy N/A 03/30/2013    Procedure: TOTAL PARATHYROIDECTOMY WITH AUTOTRANSPLANT;  Surgeon: Earnstine Regal, MD;  Location: [place];  Service: General;  Laterality: N/A;  Autotransplant to left lower arm.  . Total knee arthroplasty Right 03/27/2014    DR Marlou Sa  . Total knee arthroplasty Right 03/27/2014    Procedure:  UNICOMPARTMENTAL ARTHROPLASTY;  Surgeon: Meredith Pel, MD;  Location: [place];  Service: Orthopedics;  Laterality: Right;  . Breath tek h pylori N/A 05/15/2014    Procedure: BREATH TEK H PYLORI;  Surgeon: Alphonsa Overall, MD;  Location: [facility];  Service: General;  Laterality: N/A;  . Laparoscopic gastric banding  N/A 11/12/2014    Procedure: LAPAROSCOPIC GASTRIC BANDING;  Surgeon: Alphonsa Overall, MD;  Location: [facility];  Service: General;  Laterality: N/A;    Social History: History   Social History  . Marital Status: Single    Spouse Name: N/A  . Number of Children: N/A  . Years of Education: N/A   Social History Main Topics  . Smoking status: Current Some Day Smoker -- 0.20 packs/day for 13 years    Types: Cigarettes  . Smokeless tobacco: Never Used     Comment: 2 cigs a day  . Alcohol Use: No  . Drug Use: No  . Sexual Activity: Not on file   Other Topics Concern  . None   Social History Narrative    Family History: No family history on file.  Allergies: No Known Allergies  Current Facility-Administered Medications  Medication Dose Route Frequency Provider Last Rate Last Dose  . 0.9 %  sodium chloride infusion   Intravenous Continuous Deneise Lever, MD      . heparin injection 5,000 Units  5,000 Units Subcutaneous 3 times per day Deneise Lever, MD      . HYDROmorphone (DILAUDID) injection 1 mg  1 mg Intravenous Once [employer], [name]      . morphine 2 MG/ML injection 2-4 mg  2-4 mg Intravenous Q3H PRN Deneise Lever, MD      . ondansetron [facility]) injection 4 mg  4 mg Intravenous Once Wandra Arthurs, MD      . ondansetron [facility]) tablet 4 mg  4 mg Oral Q6H PRN Deneise Lever, MD       Or  . ondansetron [facility]) injection 4 mg  4 mg Intravenous Q6H PRN Deneise Lever, MD      . ondansetron (ZOFRAN-ODT) disintegrating tablet 4 mg  4 mg Oral Q8H PRN Deneise Lever, MD      . sodium chloride 0.9 % bolus 500 mL  500 mL Intravenous Once Wandra Arthurs, MD       Current Outpatient Prescriptions  Medication Sig Dispense Refill  . omeprazole (PRILOSEC) 20 MG capsule Take 20 mg by mouth daily as needed (for heartburn).     . ondansetron (ZOFRAN ODT) 4 MG disintegrating tablet Take 1 tablet (4 mg total) by mouth every 8 (eight) hours as needed for nausea or vomiting. 10 tablet 0   . oxyCODONE (OXY IR/ROXICODONE) 5 MG immediate release tablet Take 5-10 mg by mouth every 3 (three) hours as needed for moderate pain or breakthrough pain.    . sevelamer carbonate (RENVELA) 2.4 G PACK Take 2.4 g by mouth 3 (three) times daily with meals.     Review Of Systems: 12 point ROS negative except as noted above in HPI.  Physical Exam: Filed Vitals:   11/27/14 2031  BP: 124/74  Pulse: 77  Temp:   Resp: 18    General: alert, cooperative and morbidly obese HEENT: PERRLA and extra ocular movement intact Heart: S1, S2 normal, no murmur, rub or gallop, regular rate and rhythm Lungs: clear to auscultation, no wheezes or rales and unlabored breathing Abdomen: obese abdomen, + bowel sounds, surgical site CDI, mild-moderate  generalized abd TTP  Extremities: extremities normal, atraumatic, no cyanosis or edema Skin:no rashes Neurology: normal without focal findings  Labs and Imaging: Lab Results  Component Value Date/Time   NA 136 11/27/2014 05:45 PM   K 4.4 11/27/2014 05:45 PM   CL 96 11/27/2014 05:45 PM   CO2 19 11/27/2014 05:45 PM   BUN 21 11/27/2014 05:45 PM   CREATININE 10.48* 11/27/2014 05:45 PM   GLUCOSE 88 11/27/2014 05:45 PM   Lab Results  Component Value Date   WBC 7.9 11/27/2014   HGB 13.6 11/27/2014   HCT 40.7 11/27/2014   MCV 92.7 11/27/2014   PLT 211 11/27/2014    No results found.         Shanda Howells MD  Pager: [phone]

## 2014-11-27 NOTE — ED Provider Notes (Signed)
CSN: [id number]     Arrival date & time 11/27/14  1727 History   First MD Initiated Contact with Patient 11/27/14 1938     Chief Complaint  Patient presents with  . Abdominal Pain  . Emesis     (Consider location/radiation/quality/duration/timing/severity/associated sxs/prior Treatment) Patient is a 29 y.o. male presenting with abdominal pain and vomiting. The history is provided by the patient. No language interpreter was used.  Abdominal Pain Associated symptoms: nausea and vomiting   Associated symptoms: no chest pain, no chills, no diarrhea, no fever and no shortness of breath   Emesis Associated symptoms: abdominal pain   Associated symptoms: no chills and no diarrhea   Mr. Youmans is a 29 y.o male with a history of gastric bypass and dialysis who presents with persistent abdominal pain that comes in waves. He has also been nauseated and vomiting for the past 2 days. He had lap band surgery on 11/12/14 by Dr. Lucia Gaskins.  He states his pain is 10/10 now. His last BM was 5 days ago which he states is normal for him.  He had a CT of his abdomen 2 days ago did not show an acute abnormality and lap band was positioned appropriately. He was seen by Dr. Ninfa Linden in the ED and was discharged home with zofran and had follow up with general surgery.  He was seen by general surgery today and was sent to the ED.  He was told that he would need to have dialysis at the hospital tomorrow.  He has dialysis on M, W, F.  Past Medical History  Diagnosis Date  . Hyperparathyroidism   . Morbid obesity   . Arthritis   . Complication of anesthesia     A little while to wake up after knee surgery in 2008  . Family history of anesthesia complication     mom slow to wake up  . Headache(784.0)     occasionally  . Dizziness     when coming off of dialysis  . Joint pain   . Joint swelling   . GERD (gastroesophageal reflux disease)     takes Omeprazole as needed  . Hypertension   . Renal disorder     MWF and  goes to [facility]  . Renal insufficiency    Past Surgical History  Procedure Laterality Date  . Av fistula placement Bilateral     Uses Right arm  . Esophagogastroduodenoscopy endoscopy    . Knee arthroscopy Left 2007  . Parathyroidectomy N/A 03/30/2013    Procedure: TOTAL PARATHYROIDECTOMY WITH AUTOTRANSPLANT;  Surgeon: Earnstine Regal, MD;  Location: [place];  Service: General;  Laterality: N/A;  Autotransplant to left lower arm.  . Total knee arthroplasty Right 03/27/2014    DR Marlou Sa  . Total knee arthroplasty Right 03/27/2014    Procedure: UNICOMPARTMENTAL ARTHROPLASTY;  Surgeon: Meredith Pel, MD;  Location: [place];  Service: Orthopedics;  Laterality: Right;  . Breath tek h pylori N/A 05/15/2014    Procedure: BREATH TEK H PYLORI;  Surgeon: Alphonsa Overall, MD;  [facility];  Service: General;  Laterality: N/A;  . Laparoscopic gastric banding N/A 11/12/2014    Procedure: LAPAROSCOPIC GASTRIC BANDING;  Surgeon: Alphonsa Overall, MD;  [facility]: [facility];  Service: General;  Laterality: N/A;   No family history on file. History  Substance Use Topics  . Smoking status: Current Some Day Smoker -- 0.20 packs/day for 13 years    Types: Cigarettes  . Smokeless tobacco: Never Used  Comment: 2 cigs a day  . Alcohol Use: No    Review of Systems  Constitutional: Negative for fever and chills.  Respiratory: Negative for shortness of breath.   Cardiovascular: Negative for chest pain.  Gastrointestinal: Positive for nausea, vomiting and abdominal pain. Negative for diarrhea and blood in stool.  Neurological: Negative for dizziness and weakness.  All other systems reviewed and are negative.     Allergies  Review of patient's allergies indicates no known allergies.  Home Medications   Prior to Admission medications   Medication Sig Start Date End Date Taking? Authorizing Provider  omeprazole (PRILOSEC) 20 MG capsule Take 20 mg by mouth daily as needed (for heartburn).    Yes  Historical Provider, MD  ondansetron (ZOFRAN ODT) 4 MG disintegrating tablet Take 1 tablet (4 mg total) by mouth every 8 (eight) hours as needed for nausea or vomiting. 11/25/14  Yes Waynetta Pean, [name]  oxyCODONE (OXY IR/ROXICODONE) 5 MG immediate release tablet Take 5-10 mg by mouth every 3 (three) hours as needed for moderate pain or breakthrough pain. 03/29/14  Yes Scott Marcene Duos, MD  sevelamer carbonate (RENVELA) 2.4 G PACK Take 2.4 g by mouth 3 (three) times daily with meals.   Yes Historical Provider, MD   BP 124/74 mmHg  Pulse 77  Temp(Src) 98.6 F (37 C) (Oral)  Resp 18  SpO2 100% Physical Exam  Constitutional: He is oriented to person, place, and time. He appears well-developed and well-nourished.  Morbid obesity.   HENT:  Head: Normocephalic and atraumatic.  Eyes: Conjunctivae are normal.  Neck: Normal range of motion. Neck supple.  Cardiovascular: Normal rate, regular rhythm and normal heart sounds.   Pulmonary/Chest: Effort normal and breath sounds normal.  Abdominal: Soft. There is tenderness in the left upper quadrant. There is guarding.  Severe epigastric abdominal tenderness and guarding.  No mass. Surgical wound is clean and dry with no surrounding erythema.   Musculoskeletal: Normal range of motion.  Neurological: He is alert and oriented to person, place, and time.  Skin: Skin is warm and dry.  Nursing note and vitals reviewed.   ED Course  Procedures (including critical care time) Labs Review Labs Reviewed  COMPREHENSIVE METABOLIC PANEL - Abnormal; Notable for the following:    Creatinine, Ser 10.48 (*)    GFR calc non Af Amer 6 (*)    GFR calc Af Amer 7 (*)    Anion gap 21 (*)    All other components within normal limits  LIPASE, BLOOD - Abnormal; Notable for the following:    Lipase 61 (*)    All other components within normal limits  CBC WITH DIFFERENTIAL/PLATELET  URINALYSIS, ROUTINE W REFLEX MICROSCOPIC  COMPREHENSIVE METABOLIC PANEL  CBC WITH  DIFFERENTIAL/PLATELET    Imaging Review No results found.   EKG Interpretation None      MDM   Final diagnoses:  S/P gastric bypass  Left upper quadrant pain  Nausea and vomiting in adult patient   Patient presents for abdominal pain after gastric lap band on 11/12/14.  He was sent here by Dr. Lucia Gaskins during an office visit today who will see him. I have given him dilaudid, fluids, and Zofran in the ED.  His vitals are stable and his potassium is normal.   20:15 I spoke to Dr. Hassell Done who said the patient seemed dehydrated in office and was unable to hold down fluids.  He would like the patient admitted so he can get dialysis tomorrow and see the  patient in the hospital.  He is a complicated patient with multiple medical problems including surgical complication after lap band 15 days ago. He is also a dialysis patient.  20:44 I spoke to Dr. Ernestina Patches who will call general surgery.  21:25 Dr. Ernestina Patches is in the ED, has seen the patient and admitted the patient.   Ottie Glazier, [name] 11/27/14 2135  Wandra Arthurs, MD 11/27/14 [phone]

## 2014-11-27 NOTE — ED Notes (Signed)
Pt to xray at this time.

## 2014-11-27 NOTE — Progress Notes (Signed)
Patient ID: [name], male   DOB: Jan 07, 1986, 29 y.o.   MRN: [id number] [name] 29 y.o.  There is no weight on file to calculate BMI.  Patient Active Problem List   Diagnosis Date Noted  . Arthritis of knee 03/27/2014  . Morbid obesity, weight 291, BMI - 47 02/15/2014  . End stage renal disease 07/12/2013  . Hyperparathyroidism, secondary 03/14/2013    No Known Allergies    Past Surgical History  Procedure Laterality Date  . Av fistula placement Bilateral     Uses Right arm  . Esophagogastroduodenoscopy endoscopy    . Knee arthroscopy Left 2007  . Parathyroidectomy N/A 03/30/2013    Procedure: TOTAL PARATHYROIDECTOMY WITH AUTOTRANSPLANT;  Surgeon: Earnstine Regal, MD;  Location: [place];  Service: General;  Laterality: N/A;  Autotransplant to left lower arm.  . Total knee arthroplasty Right 03/27/2014    DR Marlou Sa  . Total knee arthroplasty Right 03/27/2014    Procedure: UNICOMPARTMENTAL ARTHROPLASTY;  Surgeon: Meredith Pel, MD;  Location: [place];  Service: Orthopedics;  Laterality: Right;  . Breath tek h pylori N/A 05/15/2014    Procedure: BREATH TEK H PYLORI;  Surgeon: Alphonsa Overall, MD;  [facility];  Service: General;  Laterality: N/A;  . Laparoscopic gastric banding N/A 11/12/2014    Procedure: LAPAROSCOPIC GASTRIC BANDING;  Surgeon: Alphonsa Overall, MD;  [facility];  Service: General;  Laterality: N/A;   Philis Fendt, MD No diagnosis found.  The patient was brought over from [facility] after he complained of pain in his port site and nausea.  He is two [place] post lapband placement by Dr. Lucia Gaskins and has had a time with nausea leading to two visits to the ER where CT scans were performed.  These showed edema around the port site in the right upper quadrant which on exam in this office today is a hematoma surrounding his subcutaneous port.  I was unable to access his port today but encountered hematoma.  My original note is in [employer] and this  is provided for the EPIC caregivers.     It is likely that he has had bleeding complications from anticoagulation related to HD around his port and his lapband.  If his volume is depleted, then he might need medical admission to [facility] where he can receive HD tomorrow.  I have explained this to him and his sister will transport him to [facility] hospital from the office.  Since nephrology no longer admits, he could go on the hospitalist service for fluid administration and pain/nausea management and HD.  Dr. Lucia Gaskins will see tomorrow if he is admitted.   Matt B. Hassell Done, MD, [facility] [phone] beeper [phone]  11/27/2014 5:18 PM

## 2014-11-28 ENCOUNTER — Encounter (HOSPITAL_COMMUNITY): Payer: Self-pay | Admitting: *Deleted

## 2014-11-28 DIAGNOSIS — R109 Unspecified abdominal pain: Secondary | ICD-10-CM | POA: Diagnosis not present

## 2014-11-28 DIAGNOSIS — F17210 Nicotine dependence, cigarettes, uncomplicated: Secondary | ICD-10-CM | POA: Diagnosis present

## 2014-11-28 DIAGNOSIS — E860 Dehydration: Secondary | ICD-10-CM | POA: Diagnosis present

## 2014-11-28 DIAGNOSIS — Y831 Surgical operation with implant of artificial internal device as the cause of abnormal reaction of the patient, or of later complication, without mention of misadventure at the time of the procedure: Secondary | ICD-10-CM | POA: Diagnosis present

## 2014-11-28 DIAGNOSIS — Z9884 Bariatric surgery status: Secondary | ICD-10-CM | POA: Diagnosis not present

## 2014-11-28 DIAGNOSIS — N186 End stage renal disease: Secondary | ICD-10-CM | POA: Diagnosis present

## 2014-11-28 DIAGNOSIS — Z992 Dependence on renal dialysis: Secondary | ICD-10-CM | POA: Diagnosis not present

## 2014-11-28 DIAGNOSIS — N2581 Secondary hyperparathyroidism of renal origin: Secondary | ICD-10-CM | POA: Diagnosis present

## 2014-11-28 DIAGNOSIS — K219 Gastro-esophageal reflux disease without esophagitis: Secondary | ICD-10-CM | POA: Diagnosis present

## 2014-11-28 DIAGNOSIS — R1012 Left upper quadrant pain: Secondary | ICD-10-CM | POA: Diagnosis present

## 2014-11-28 DIAGNOSIS — Z6841 Body Mass Index (BMI) 40.0 and over, adult: Secondary | ICD-10-CM | POA: Diagnosis not present

## 2014-11-28 DIAGNOSIS — I120 Hypertensive chronic kidney disease with stage 5 chronic kidney disease or end stage renal disease: Secondary | ICD-10-CM | POA: Diagnosis present

## 2014-11-28 DIAGNOSIS — K9509 Other complications of gastric band procedure: Secondary | ICD-10-CM | POA: Diagnosis present

## 2014-11-28 DIAGNOSIS — M1990 Unspecified osteoarthritis, unspecified site: Secondary | ICD-10-CM | POA: Diagnosis present

## 2014-11-28 DIAGNOSIS — D649 Anemia, unspecified: Secondary | ICD-10-CM | POA: Diagnosis present

## 2014-11-28 LAB — CBC WITH DIFFERENTIAL/PLATELET
Basophils Absolute: 0.1 10*3/uL (ref 0.0–0.1)
Basophils Relative: 1 % (ref 0–1)
EOS PCT: 2 % (ref 0–5)
Eosinophils Absolute: 0.2 10*3/uL (ref 0.0–0.7)
HEMATOCRIT: 35.5 % — AB (ref 39.0–52.0)
Hemoglobin: 11.8 g/dL — ABNORMAL LOW (ref 13.0–17.0)
LYMPHS ABS: 2.7 10*3/uL (ref 0.7–4.0)
LYMPHS PCT: 28 % (ref 12–46)
MCH: 31 pg (ref 26.0–34.0)
MCHC: 33.2 g/dL (ref 30.0–36.0)
MCV: 93.2 fL (ref 78.0–100.0)
Monocytes Absolute: 0.8 10*3/uL (ref 0.1–1.0)
Monocytes Relative: 8 % (ref 3–12)
NEUTROS ABS: 5.7 10*3/uL (ref 1.7–7.7)
Neutrophils Relative %: 61 % (ref 43–77)
Platelets: 193 10*3/uL (ref 150–400)
RBC: 3.81 MIL/uL — ABNORMAL LOW (ref 4.22–5.81)
RDW: 13.5 % (ref 11.5–15.5)
WBC: 9.5 10*3/uL (ref 4.0–10.5)

## 2014-11-28 LAB — COMPREHENSIVE METABOLIC PANEL
ALK PHOS: 46 U/L (ref 39–117)
ALT: 11 U/L (ref 0–53)
ANION GAP: 15 (ref 5–15)
AST: 14 U/L (ref 0–37)
Albumin: 3.5 g/dL (ref 3.5–5.2)
BILIRUBIN TOTAL: 0.6 mg/dL (ref 0.3–1.2)
BUN: 25 mg/dL — AB (ref 6–23)
CO2: 22 mmol/L (ref 19–32)
Calcium: 8 mg/dL — ABNORMAL LOW (ref 8.4–10.5)
Chloride: 98 mmol/L (ref 96–112)
Creatinine, Ser: 11.94 mg/dL — ABNORMAL HIGH (ref 0.50–1.35)
GFR calc Af Amer: 6 mL/min — ABNORMAL LOW (ref >90)
GFR calc non Af Amer: 5 mL/min — ABNORMAL LOW (ref >90)
GLUCOSE: 88 mg/dL (ref 70–99)
Potassium: 3.9 mmol/L (ref 3.5–5.1)
Sodium: 135 mmol/L (ref 135–145)
TOTAL PROTEIN: 6.7 g/dL (ref 6.0–8.3)

## 2014-11-28 LAB — PATHOLOGIST SMEAR REVIEW

## 2014-11-28 MED ORDER — MORPHINE SULFATE 2 MG/ML IJ SOLN
INTRAMUSCULAR | Status: AC
  Filled 2014-11-28: qty 1

## 2014-11-28 NOTE — Consult Note (Signed)
[facility] Renal Consultation Note  Indication for Consultation:  Management of ESRD/hemodialysis; anemia, hypertension/volume and secondary hyperparathyroidism  HPI: [name] is a 29 y.o. male admitted with abdominal pain, nausea, and vomiting increasing in intensity past few days. He had 11/12/14 Gastric Lap Banding with Morbid obesity. Abd film in ER showed no obstruction.  AT OP HD at [facility] Monday left slightly below his edw (MWF)129.7 (130kg edw). Noted yesterday  seen by general surgery  and was redirected to the ER out of concern for possible complications of patient's gastric band. He denies SOB, CP, fevers, chills, shakes, diarrhea or constipation. In the ER WBC= 7.9, hemoglobin 13.6,and noted CT ABD 11/25/14 for abd pain negative for any acute abnormalities. [facility] consult pending.     Past Medical History  Diagnosis Date  . Hyperparathyroidism   . Morbid obesity   . Arthritis   . Complication of anesthesia     A little while to wake up after knee surgery in 2008  . Family history of anesthesia complication     mom slow to wake up  . Headache(784.0)     occasionally  . Dizziness     when coming off of dialysis  . Joint pain   . Joint swelling   . GERD (gastroesophageal reflux disease)     takes Omeprazole as needed  . Hypertension   . Renal disorder     MWF and goes to [facility]  . Renal insufficiency     Past Surgical History  Procedure Laterality Date  . Av fistula placement Bilateral     Uses Right arm  . Esophagogastroduodenoscopy endoscopy    . Knee arthroscopy Left 2007  . Parathyroidectomy N/A 03/30/2013    Procedure: TOTAL PARATHYROIDECTOMY WITH AUTOTRANSPLANT;  Surgeon: Earnstine Regal, MD;  Location: [place];  Service: General;  Laterality: N/A;  Autotransplant to left lower arm.  . Total knee arthroplasty Right 03/27/2014    DR Marlou Sa  . Total knee arthroplasty Right 03/27/2014    Procedure: UNICOMPARTMENTAL ARTHROPLASTY;  Surgeon: Meredith Pel, MD;  Location: [place];  Service: Orthopedics;  Laterality: Right;  . Breath tek h pylori N/A 05/15/2014    Procedure: BREATH TEK H PYLORI;  Surgeon: Alphonsa Overall, MD;  [facility];  Service: General;  Laterality: N/A;  . Laparoscopic gastric banding N/A 11/12/2014    Procedure: LAPAROSCOPIC GASTRIC BANDING;  Surgeon: Alphonsa Overall, MD;  [facility];  Service: General;  Laterality: N/A;     History reviewed. No pertinent family history.    reports that he has been smoking Cigarettes.  He has a 2.6 pack-year smoking history. He has never used smokeless tobacco. He reports that he does not drink alcohol or use illicit drugs.  No Known Allergies  Prior to Admission medications   Medication Sig Start Date End Date Taking? Authorizing Provider  omeprazole (PRILOSEC) 20 MG capsule Take 20 mg by mouth daily as needed (for heartburn).    Yes Historical Provider, MD  ondansetron (ZOFRAN ODT) 4 MG disintegrating tablet Take 1 tablet (4 mg total) by mouth every 8 (eight) hours as needed for nausea or vomiting. 11/25/14  Yes Waynetta Pean, [name]  oxyCODONE (OXY IR/ROXICODONE) 5 MG immediate release tablet Take 5-10 mg by mouth every 3 (three) hours as needed for moderate pain or breakthrough pain. 03/29/14  Yes Scott Marcene Duos, MD  sevelamer carbonate (RENVELA) 2.4 G PACK Take 2.4 g by mouth 3 (three) times daily with meals.  Yes Historical Provider, MD     Anti-infectives    None      Results for orders placed or performed during the hospital encounter of 11/27/14 (from the past 48 hour(s))  CBC with Differential     Status: None   Collection Time: 11/27/14  5:45 PM  Result Value Ref Range   WBC 7.9 4.0 - 10.5 K/uL   RBC 4.39 4.22 - 5.81 MIL/uL   Hemoglobin 13.6 13.0 - 17.0 g/dL   HCT 40.7 39.0 - 52.0 %   MCV 92.7 78.0 - 100.0 fL   MCH 31.0 26.0 - 34.0 pg   MCHC 33.4 30.0 - 36.0 g/dL   RDW 13.2 11.5 - 15.5 %   Platelets 211 150 - 400 K/uL   Neutrophils Relative % 76 43  - 77 %   Neutro Abs 6.0 1.7 - 7.7 K/uL   Lymphocytes Relative 14 12 - 46 %   Lymphs Abs 1.1 0.7 - 4.0 K/uL   Monocytes Relative 9 3 - 12 %   Monocytes Absolute 0.7 0.1 - 1.0 K/uL   Eosinophils Relative 1 0 - 5 %   Eosinophils Absolute 0.0 0.0 - 0.7 K/uL   Basophils Relative 0 0 - 1 %   Basophils Absolute 0.0 0.0 - 0.1 K/uL  Comprehensive metabolic panel     Status: Abnormal   Collection Time: 11/27/14  5:45 PM  Result Value Ref Range   Sodium 136 135 - 145 mmol/L   Potassium 4.4 3.5 - 5.1 mmol/L   Chloride 96 96 - 112 mmol/L   CO2 19 19 - 32 mmol/L   Glucose, Bld 88 70 - 99 mg/dL   BUN 21 6 - 23 mg/dL   Creatinine, Ser 10.48 (H) 0.50 - 1.35 mg/dL   Calcium 9.1 8.4 - 10.5 mg/dL   Total Protein 7.8 6.0 - 8.3 g/dL   Albumin 4.1 3.5 - 5.2 g/dL   AST 19 0 - 37 U/L   ALT 13 0 - 53 U/L   Alkaline Phosphatase 56 39 - 117 U/L   Total Bilirubin 0.8 0.3 - 1.2 mg/dL   GFR calc non Af Amer 6 (L) >90 mL/min   GFR calc Af Amer 7 (L) >90 mL/min    Comment: (NOTE) The eGFR has been calculated using the CKD EPI equation. This calculation has not been validated in all clinical situations. eGFR's persistently <90 mL/min signify possible Chronic Kidney Disease.    Anion gap 21 (H) 5 - 15  Lipase, blood     Status: Abnormal   Collection Time: 11/27/14  5:45 PM  Result Value Ref Range   Lipase 61 (H) 11 - 59 U/L  Comprehensive metabolic panel     Status: Abnormal   Collection Time: 11/28/14  6:38 AM  Result Value Ref Range   Sodium 135 135 - 145 mmol/L   Potassium 3.9 3.5 - 5.1 mmol/L   Chloride 98 96 - 112 mmol/L   CO2 22 19 - 32 mmol/L   Glucose, Bld 88 70 - 99 mg/dL   BUN 25 (H) 6 - 23 mg/dL   Creatinine, Ser 11.94 (H) 0.50 - 1.35 mg/dL   Calcium 8.0 (L) 8.4 - 10.5 mg/dL   Total Protein 6.7 6.0 - 8.3 g/dL   Albumin 3.5 3.5 - 5.2 g/dL   AST 14 0 - 37 U/L   ALT 11 0 - 53 U/L   Alkaline Phosphatase 46 39 - 117 U/L   Total Bilirubin 0.6 0.3 -  1.2 mg/dL   GFR calc non Af Amer 5 (L)  >90 mL/min   GFR calc Af Amer 6 (L) >90 mL/min    Comment: (NOTE) The eGFR has been calculated using the CKD EPI equation. This calculation has not been validated in all clinical situations. eGFR's persistently <90 mL/min signify possible Chronic Kidney Disease.    Anion gap 15 5 - 15  CBC WITH DIFFERENTIAL     Status: Abnormal   Collection Time: 11/28/14  6:38 AM  Result Value Ref Range   WBC 9.5 4.0 - 10.5 K/uL   RBC 3.81 (L) 4.22 - 5.81 MIL/uL   Hemoglobin 11.8 (L) 13.0 - 17.0 g/dL   HCT 35.5 (L) 39.0 - 52.0 %   MCV 93.2 78.0 - 100.0 fL   MCH 31.0 26.0 - 34.0 pg   MCHC 33.2 30.0 - 36.0 g/dL   RDW 13.5 11.5 - 15.5 %   Platelets 193 150 - 400 K/uL   Neutrophils Relative % 61 43 - 77 %   Lymphocytes Relative 28 12 - 46 %   Monocytes Relative 8 3 - 12 %   Eosinophils Relative 2 0 - 5 %   Basophils Relative 1 0 - 1 %   Neutro Abs 5.7 1.7 - 7.7 K/uL   Lymphs Abs 2.7 0.7 - 4.0 K/uL   Monocytes Absolute 0.8 0.1 - 1.0 K/uL   Eosinophils Absolute 0.2 0.0 - 0.7 K/uL   Basophils Absolute 0.1 0.0 - 0.1 K/uL   WBC Morphology ATYPICAL LYMPHOCYTES      ROS: see hpi for positives            Physical Exam: Filed Vitals:   11/28/14 1000  BP: 110/62  Pulse: 85  Temp: 98.2 F (36.8 C)  Resp: 20     General: alert obese bm Ox3, NAD , appears uncomfortable but not in Distress HEENT: [place], MMM, nonicteric Neck: supple, no jvd Heart: RRR, no rub, gallop ormur Lungs: CTA bilat Abdomen: Obese, BS decr. Soft Tenderdiffuly morein epigastric area Extremities: no pedal edema Skin: no acute rash/warm dry/ no pedal ulcers Neuro: alert OX3, no acute focal deficits/ moves all extrem. Dialysis Access: pos. Bruit RFA AVF  Dialysis Orders: Center: [facility] on MWF . EDW 130 (but left at 129.7 last) HD Bath 2.0k/ 3.5 ca  Time 4.5 hrs Heparin 12000. Access RFA AVF BFR 400 DFR 800    PO Calcitriol .50 mcg /HD/ Mircera 6m q2wks (last given 11/14/14)    Units IV/HD  Venofer  510mq weekly hd   Other  Op  labs= hgb 11.9 ca 8.7 phos 8.2  pth 8.0  Assessment/Plan 1. Abdominal pain Nausea/vomiting /SP Gastric Band placement 11/12/14- per admit /CCS consult pending 2. ESRD -  HD MWF schedule 3.9 k /vol okay left below edw last tx  And Expect lowering of edw sp Gi Gastric banding/ no hep hd today / no uf today 3. Hypertension/volume  - bp 110/62 vol okay 4. Anemia  - hgb 11.8 monitor hgb  / fe weekly hd /fu  hgb no esa for now 5. Metabolic bone disease -  Added ca bath  Po vit d on hd day/ fu ca phos/ renvela binder 6.  Morbid Obesity / Nutrition-sp Gastric Banding fu alb  For supplement/   DaErnest HaberPA-C [phone]/27/2016, 11:39 AM

## 2014-11-28 NOTE — Progress Notes (Addendum)
General Surgery Note  LOS: 0 days  POD -     Assessment/Plan: 1.  Nausea and vomiting  Possibly due to swelling/bleeding around lap band/HH repair.  There are no stigmata on the CT scans that he has had since surgery of a significant bleed or mass around the lap band.  Will have go slowly with PO's.  Our patients stay on liquid protein diets for 2 to 3 [place] post op any way.  On ice chips.  And can have some water.  Will leave there tonight.  Possible clear liquids in AM.  He actually looks pretty good right now.  I will see him daily while he is in the hospital.  2.  Pain control  Not sure why he has hurt so much.  Most patients are sore/tender for 3 to 7 days.  He is beyond that time.  3.  Lap band - Placed 11/12/2014 - D. Myiah Petkus 4.  Hiatal hernia - repaired at the same time as the lap bnad 5.  ERSD - Dr. Lenna Sciara. [facility] - MWF 4. HTN 5. 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 Gerkin 7. Arthritis right knee - Dr. Marlou Sa  8.  DVT prophylaxis - SQ Heparin   Active Problems:   Abdominal pain   Subjective:  Taking some ice chips without nausea or pain.  He looks better than when I last saw him. Objective:   Filed Vitals:   11/28/14 1000  BP: 110/62  Pulse: 85  Temp: 98.2 F (36.8 C)  Resp: 20     Intake/Output from previous day:  12/21/2022 0701 - 04/27 0700 In: 2060 [I.V.:2060] Out: 0   Intake/Output this shift:      Physical Exam:   General: Obese AA M who is alert and oriented.    HEENT: Normal. Pupils equal. .   Lungs: Clear.   Abdomen: Soft   Wound: Incisions look okay.   Lab Results:    Recent Labs  12-21-14 1745 11/28/14 0638  WBC 7.9 9.5  HGB 13.6 11.8*  HCT 40.7 35.5*  PLT 211 193    BMET   Recent Labs  Dec 21, 2014 1745 11/28/14 0638  NA 136 135  K 4.4 3.9  CL 96 98  CO2 19 22  GLUCOSE 88 88  BUN 21 25*  CREATININE 10.48* 11.94*  CALCIUM 9.1 8.0*    PT/INR  No results for input(s): LABPROT, INR in the last 72  hours.  ABG  No results for input(s): PHART, HCO3 in the last 72 hours.  Invalid input(s): PCO2, PO2   Studies/Results:  Dg Abd 1 View  12/21/2014   CLINICAL DATA:  Abdominal pain and emesis  EXAM: ABDOMEN - 1 VIEW  COMPARISON:  11/25/2014  FINDINGS: Previous laparoscopic gastric banding noted. Scattered air and contrast throughout the bowel. No obstruction pattern. Right pelvic calcifications consistent with venous phleboliths. No acute osseous finding. Obese body habitus.  IMPRESSION: No acute finding by plain radiography.  Negative for obstruction.   Electronically Signed   By: Jerilynn Mages.  Shick M.D.   On: 21-Dec-2014 21:40     Anti-infectives:   Anti-infectives    None      Alphonsa Overall, MD, FACS Pager: [phone] Surgery Office: [phone] 11/28/2014

## 2014-11-28 NOTE — Progress Notes (Signed)
UR completed 

## 2014-11-28 NOTE — Progress Notes (Signed)
[facility] PROGRESS NOTE  [name] [id number] DOB: 1985/08/08 DOA: 11/27/2014 PCP: Philis Fendt, MD  Assessment/Plan: Active Problems:   End stage renal disease -Nephrology consulted and currently managing hemodialysis    Abdominal pain -Gen. surgery does not feel this is typical status post lap banding. As such will obtain CT scan of abdomen - Gen. surgery on board and assisting with management - Continue supportive therapy   Code Status: full Family Communication: No family at bedside Disposition Plan:  Pending improvement in condition.   Consultants:  General surgery  Nephrology  Procedures:  None  Antibiotics:  None  HPI/Subjective: Patient has no new complaints  Objective: Filed Vitals:   11/28/14 1000  BP: 110/62  Pulse: 85  Temp: 98.2 F (36.8 C)  Resp: 20    Intake/Output Summary (Last 24 hours) at 11/28/14 1842 Last data filed at 11/28/14 1700  Gross per 24 hour  Intake   2060 ml  Output      0 ml  Net   2060 ml   Filed Weights   11/27/14 2324  Weight: 131.725 kg (290 lb 6.4 oz)    Exam:   General:  Patient in no acute distress, alert and awake, looks uncomfortable  Cardiovascular: Regular rate and rhythm, no murmurs or rubs  Respiratory: Here to auscultation bilaterally, no wheezes  Abdomen: Soft, generalized discomfort with palpation, nondistended  Musculoskeletal: No cyanosis or clubbing   Data Reviewed: Basic Metabolic Panel:  Recent Labs Lab 11/25/14 1415 11/27/14 1745 11/28/14 0638  NA 136 136 135  K 4.0 4.4 3.9  CL 97 96 98  CO2 21 19 22   GLUCOSE 95 88 88  BUN 29* 21 25*  CREATININE 12.35* 10.48* 11.94*  CALCIUM 7.8* 9.1 8.0*   Liver Function Tests:  Recent Labs Lab 11/25/14 1415 11/27/14 1745 11/28/14 0638  AST 18 19 14   ALT 11 13 11   ALKPHOS 54 56 46  BILITOT 0.8 0.8 0.6  PROT 7.6 7.8 6.7  ALBUMIN 3.8 4.1 3.5    Recent Labs Lab 11/25/14 1415 11/27/14 1745  LIPASE 47 61*    No results for input(s): AMMONIA in the last 168 hours. CBC:  Recent Labs Lab 11/25/14 1415 11/27/14 1745 11/28/14 0638  WBC 9.6 7.9 9.5  NEUTROABS 7.9* 6.0 5.7  HGB 12.6* 13.6 11.8*  HCT 37.8* 40.7 35.5*  MCV 93.1 92.7 93.2  PLT 209 211 193   Cardiac Enzymes: No results for input(s): CKTOTAL, CKMB, CKMBINDEX, TROPONINI in the last 168 hours. BNP (last 3 results) No results for input(s): BNP in the last 8760 hours.  ProBNP (last 3 results)  Recent Labs  01/10/14 1152 04/09/14 1658  PROBNP 958.2* 739.9*    CBG: No results for input(s): GLUCAP in the last 168 hours.  No results found for this or any previous visit (from the past 240 hour(s)).   Studies: Dg Abd 1 View  11/27/2014   CLINICAL DATA:  Abdominal pain and emesis  EXAM: ABDOMEN - 1 VIEW  COMPARISON:  11/25/2014  FINDINGS: Previous laparoscopic gastric banding noted. Scattered air and contrast throughout the bowel. No obstruction pattern. Right pelvic calcifications consistent with venous phleboliths. No acute osseous finding. Obese body habitus.  IMPRESSION: No acute finding by plain radiography.  Negative for obstruction.   Electronically Signed   By: Jerilynn Mages.  Shick M.D.   On: 11/27/2014 21:40    Scheduled Meds: . heparin  5,000 Units Subcutaneous 3 times per day   Continuous Infusions: . sodium chloride 65  mL/hr at 11/28/14 0238    Time spent: > 35 minutes    Velvet Bathe  [facility] Pager [phone] If 7PM-7AM, please contact night-coverage at [email or web address], password [facility] 11/28/2014, 6:42 PM  LOS: 0 days

## 2014-11-29 ENCOUNTER — Encounter (HOSPITAL_COMMUNITY): Payer: Self-pay

## 2014-11-29 ENCOUNTER — Inpatient Hospital Stay (HOSPITAL_COMMUNITY): Payer: Medicare Other

## 2014-11-29 LAB — COMPREHENSIVE METABOLIC PANEL
ALBUMIN: 3.4 g/dL — AB (ref 3.5–5.2)
ALT: 11 U/L (ref 0–53)
ANION GAP: 14 (ref 5–15)
AST: 14 U/L (ref 0–37)
Alkaline Phosphatase: 44 U/L (ref 39–117)
BUN: 17 mg/dL (ref 6–23)
CALCIUM: 7.9 mg/dL — AB (ref 8.4–10.5)
CO2: 25 mmol/L (ref 19–32)
CREATININE: 11.07 mg/dL — AB (ref 0.50–1.35)
Chloride: 102 mmol/L (ref 96–112)
GFR calc Af Amer: 6 mL/min — ABNORMAL LOW (ref >90)
GFR calc non Af Amer: 5 mL/min — ABNORMAL LOW (ref >90)
Glucose, Bld: 70 mg/dL (ref 70–99)
Potassium: 4.1 mmol/L (ref 3.5–5.1)
Sodium: 141 mmol/L (ref 135–145)
Total Bilirubin: 0.6 mg/dL (ref 0.3–1.2)
Total Protein: 6.8 g/dL (ref 6.0–8.3)

## 2014-11-29 LAB — HEPATITIS B SURFACE ANTIGEN: Hepatitis B Surface Ag: NEGATIVE

## 2014-11-29 LAB — CBC WITH DIFFERENTIAL/PLATELET
Basophils Absolute: 0 10*3/uL (ref 0.0–0.1)
Basophils Relative: 1 % (ref 0–1)
Eosinophils Absolute: 0.2 10*3/uL (ref 0.0–0.7)
Eosinophils Relative: 3 % (ref 0–5)
HCT: 35.2 % — ABNORMAL LOW (ref 39.0–52.0)
HEMOGLOBIN: 11.4 g/dL — AB (ref 13.0–17.0)
LYMPHS ABS: 2.5 10*3/uL (ref 0.7–4.0)
Lymphocytes Relative: 33 % (ref 12–46)
MCH: 30.5 pg (ref 26.0–34.0)
MCHC: 32.4 g/dL (ref 30.0–36.0)
MCV: 94.1 fL (ref 78.0–100.0)
MONOS PCT: 10 % (ref 3–12)
Monocytes Absolute: 0.8 10*3/uL (ref 0.1–1.0)
NEUTROS ABS: 4.1 10*3/uL (ref 1.7–7.7)
NEUTROS PCT: 53 % (ref 43–77)
Platelets: 189 10*3/uL (ref 150–400)
RBC: 3.74 MIL/uL — AB (ref 4.22–5.81)
RDW: 13.4 % (ref 11.5–15.5)
WBC: 7.8 10*3/uL (ref 4.0–10.5)

## 2014-11-29 MED ORDER — RENA-VITE PO TABS
1.0000 | ORAL_TABLET | Freq: Every day | ORAL | Status: DC
  Administered 2014-11-29 – 2014-11-30 (×2): 1 via ORAL
  Filled 2014-11-29 (×2): qty 1

## 2014-11-29 MED ORDER — CALCITRIOL 0.5 MCG PO CAPS
0.5000 ug | ORAL_CAPSULE | ORAL | Status: DC
  Filled 2014-11-29: qty 1

## 2014-11-29 MED ORDER — NA FERRIC GLUC CPLX IN SUCROSE 12.5 MG/ML IV SOLN: 62.5000 mg | INTRAVENOUS | Status: DC

## 2014-11-29 MED ORDER — IOHEXOL 300 MG/ML  SOLN
100.0000 mL | Freq: Once | INTRAMUSCULAR | Status: AC | PRN
  Administered 2014-11-29: 100 mL via INTRAVENOUS

## 2014-11-29 MED ORDER — CALCITRIOL 0.5 MCG PO CAPS: 0.5000 ug | ORAL_CAPSULE | ORAL | Status: DC

## 2014-11-29 MED ORDER — IOHEXOL 300 MG/ML  SOLN
25.0000 mL | INTRAMUSCULAR | Status: AC
  Administered 2014-11-29 (×2): 25 mL via ORAL

## 2014-11-29 NOTE — Progress Notes (Signed)
Dressing to RUQ of abdomen detached; no drainage noted at this time; dry dressing applied.

## 2014-11-29 NOTE — Progress Notes (Signed)
Subjective:  Tolerated hd last night  /abd slightly better /ct abd this am pending drinking contrast  Objective Vital signs in last 24 hours: Filed Vitals:   11/28/14 2330 11/28/14 2345 11/28/14 2351 11/29/14 0500  BP: 101/64 103/60 119/63 112/56  Pulse: 90 69 69 80  Temp:  98.4 F (36.9 C) 98.4 F (36.9 C) 98.1 F (36.7 C)  TempSrc:  Oral Oral Oral  Resp:  18 18 18   Height:      Weight:  132 kg (291 lb 0.1 oz)    SpO2:  98% 100% 98%   Weight change: 0.276 kg (9.7 oz)  Physical Exam: General: alert obese BM, NAD, appropriate, moreccomfortable this am Heart: RRR, no rub, gallop ormur Lungs: CTA bilat Abdomen: Obese, BS decr. Soft Tender/ ND Extremities:  Trace pedal edema. Dialysis Access: pos. Bruit RFA AVF   OP Dialysis Orders: Center: [facility] on MWF . EDW 130 (but left at 129.7 last) HD Bath 2.0k/ 3.5 ca Time 4.5 hrs Heparin 12000. Access RFA AVF BFR 400 DFR 800  PO Calcitriol .50 mcg /HD/ Mircera 50mg  q2wks (last given 11/14/14) Units IV/HD Venofer 50mg  q weekly hd  Other Op labs= hgb 11.9 ca 8.7 phos 8.2 pth 8.0  Problem/Plan: 1. Abdominal pain Nausea/vomiting /SP Gastric Band placement 11/12/14- per admit ct abd this am / Dr. Lucia Gaskins following 2. ESRD - HD MWF schedule 4.1 K this am/ bfr max 300 yest .(did have Dr. Augustin Coupe PTA AVF 11/20/14 Lu Duffel need ligation of competing veins if problems with bfr" per pt will need to verify [facility] records ) hold heparin with hd pending abd ct results today. 3. Hypertension/volume - bp 112/56 vol okay sl above edw by 2 kg  edw but hosp bed wt  Need stand wt in am  ( will have slow lowering of edw with decr po intake with Banding  And try to avoid  BP drop on hd) 4. Anemia - hgb 11.8>11.4 this am stable/  / fe weekly hd /fu hgb no esa for now( Mircera 34mcg q 2 wk last  Given 11/14/14) 5. Metabolic bone disease -  Ca = 7.9  Corrected Ca 8.2 ( On Added ca bath as at op hd) Po vit d on hd day/ fu ca phos pre hd in am hd if still here/  on renvela binder with eating  6. Morbid Obesity / Nutrition-sp Gastric Banding fu alb For supplement/  [name], [name] [facility] [phone] 11/29/2014,8:06 AM  LOS: 1 day   Labs: Basic Metabolic Panel:  Recent Labs Lab 11/27/14 1745 11/28/14 0638 11/29/14 0535  NA 136 135 141  K 4.4 3.9 4.1  CL 96 98 102  CO2 19 22 25   GLUCOSE 88 88 70  BUN 21 25* 17  CREATININE 10.48* 11.94* 11.07*  CALCIUM 9.1 8.0* 7.9*   Liver Function Tests:  Recent Labs Lab 11/27/14 1745 11/28/14 0638 11/29/14 0535  AST 19 14 14   ALT 13 11 11   ALKPHOS 56 46 44  BILITOT 0.8 0.6 0.6  PROT 7.8 6.7 6.8  ALBUMIN 4.1 3.5 3.4*    Recent Labs Lab 11/25/14 1415 11/27/14 1745  LIPASE 47 61*   No results for input(s): AMMONIA in the last 168 hours. CBC:  Recent Labs Lab 11/25/14 1415 11/27/14 1745 11/28/14 0638 11/29/14 0535  WBC 9.6 7.9 9.5 7.8  NEUTROABS 7.9* 6.0 5.7 4.1  HGB 12.6* 13.6 11.8* 11.4*  HCT 37.8* 40.7 35.5* 35.2*  MCV 93.1 92.7 93.2  94.1  PLT 209 211 193 189   Cardiac Enzymes: No results for input(s): CKTOTAL, CKMB, CKMBINDEX, TROPONINI in the last 168 hours. CBG: No results for input(s): GLUCAP in the last 168 hours.  Studies/Results: Dg Abd 1 View  11/27/2014   CLINICAL DATA:  Abdominal pain and emesis  EXAM: ABDOMEN - 1 VIEW  COMPARISON:  11/25/2014  FINDINGS: Previous laparoscopic gastric banding noted. Scattered air and contrast throughout the bowel. No obstruction pattern. Right pelvic calcifications consistent with venous phleboliths. No acute osseous finding. Obese body habitus.  IMPRESSION: No acute finding by plain radiography.  Negative for obstruction.   Electronically Signed   By: Jerilynn Mages.  Shick M.D.   On: 11/27/2014 21:40   Medications: . sodium chloride 65 mL/hr at 11/29/14 0600   . heparin  5,000 Units Subcutaneous 3 times per day  . morphine

## 2014-11-29 NOTE — Progress Notes (Signed)
[facility] PROGRESS NOTE  [name] [id number] DOB: August 22, 1985 DOA: 11/27/2014 PCP: Philis Fendt, MD  Assessment/Plan: Active Problems:   End stage renal disease -Nephrology consulted and currently managing hemodialysis    Abdominal pain -Gen. surgery does not feel this is typical status post lap banding.  - CT scan obtained and negative for acute findings - Gen. surgery on board and assisting with management - Continue supportive therapy   Code Status: full Family Communication: No family at bedside Disposition Plan:  Likely d/c next am with continued improvement in condition.   Consultants:  General surgery  Nephrology  Procedures:  None  Antibiotics:  None  HPI/Subjective: Patient has no new complaints, no acute issues reported overnight.  Objective: Filed Vitals:   11/29/14 1345  BP: 100/50  Pulse: 75  Temp: 98.1 F (36.7 C)  Resp: 17    Intake/Output Summary (Last 24 hours) at 11/29/14 1727 Last data filed at 11/29/14 1300  Gross per 24 hour  Intake   1560 ml  Output      0 ml  Net   1560 ml   Filed Weights   11/27/14 2324 11/28/14 1942 11/28/14 2345  Weight: 131.725 kg (290 lb 6.4 oz) 132 kg (291 lb 0.1 oz) 132 kg (291 lb 0.1 oz)    Exam:   General:  Patient in no acute distress, alert and awake, looks uncomfortable  Cardiovascular: Regular rate and rhythm, no murmurs or rubs  Respiratory: Here to auscultation bilaterally, no wheezes  Abdomen: Soft, generalized discomfort with palpation, nondistended  Musculoskeletal: No cyanosis or clubbing   Data Reviewed: Basic Metabolic Panel:  Recent Labs Lab 11/25/14 1415 11/27/14 1745 11/28/14 0638 11/29/14 0535  NA 136 136 135 141  K 4.0 4.4 3.9 4.1  CL 97 96 98 102  CO2 21 19 22 25   GLUCOSE 95 88 88 70  BUN 29* 21 25* 17  CREATININE 12.35* 10.48* 11.94* 11.07*  CALCIUM 7.8* 9.1 8.0* 7.9*   Liver Function Tests:  Recent Labs Lab 11/25/14 1415 11/27/14 1745  11/28/14 0638 11/29/14 0535  AST 18 19 14 14   ALT 11 13 11 11   ALKPHOS 54 56 46 44  BILITOT 0.8 0.8 0.6 0.6  PROT 7.6 7.8 6.7 6.8  ALBUMIN 3.8 4.1 3.5 3.4*    Recent Labs Lab 11/25/14 1415 11/27/14 1745  LIPASE 47 61*   No results for input(s): AMMONIA in the last 168 hours. CBC:  Recent Labs Lab 11/25/14 1415 11/27/14 1745 11/28/14 0638 11/29/14 0535  WBC 9.6 7.9 9.5 7.8  NEUTROABS 7.9* 6.0 5.7 4.1  HGB 12.6* 13.6 11.8* 11.4*  HCT 37.8* 40.7 35.5* 35.2*  MCV 93.1 92.7 93.2 94.1  PLT 209 211 193 189   Cardiac Enzymes: No results for input(s): CKTOTAL, CKMB, CKMBINDEX, TROPONINI in the last 168 hours. BNP (last 3 results) No results for input(s): BNP in the last 8760 hours.  ProBNP (last 3 results)  Recent Labs  01/10/14 1152 04/09/14 1658  PROBNP 958.2* 739.9*    CBG: No results for input(s): GLUCAP in the last 168 hours.  No results found for this or any previous visit (from the past 240 hour(s)).   Studies: Dg Abd 1 View  11/27/2014   CLINICAL DATA:  Abdominal pain and emesis  EXAM: ABDOMEN - 1 VIEW  COMPARISON:  11/25/2014  FINDINGS: Previous laparoscopic gastric banding noted. Scattered air and contrast throughout the bowel. No obstruction pattern. Right pelvic calcifications consistent with venous phleboliths. No acute osseous finding.  Obese body habitus.  IMPRESSION: No acute finding by plain radiography.  Negative for obstruction.   Electronically Signed   By: Jerilynn Mages.  Shick M.D.   On: 11/27/2014 21:40   Ct Abdomen W Contrast  11/29/2014   CLINICAL DATA:  29 year old status post lap band surgery. Recent re- admission for nausea vomiting and increased abdominal pain.  EXAM: CT ABDOMEN WITH CONTRAST  TECHNIQUE: Multidetector CT imaging of the abdomen was performed using the standard protocol following bolus administration of intravenous contrast.  CONTRAST:  185mL OMNIPAQUE IOHEXOL 300 MG/ML  SOLN  COMPARISON:  11/25/2014  FINDINGS: Lower chest: There is no  pleural effusion identified. The lung bases appear clear.  Hepatobiliary: No suspicious liver abnormality. Vicarious excretion of contrast material into the gallbladder noted. There is no biliary dilatation.  Pancreas: Normal.  Spleen: Normal appearance of the spleen.  Adrenals/Urinary Tract: The adrenal glands are both within normal limits. Bilateral end-stage kidneys containing numerous cysts from dialysis noted. Nonobstructing calculus within the mid right kidney measures 3 mm.  Stomach/Bowel: The gastric band is again identified and appears to be in stable position. No complicating features are associated with the gastric band. The tubing associated with the band appears to be intact. The small bowel loops have a normal course and caliber. The appendix is visualized and appears normal. No evidence for bowel obstruction. The visualized portions of the colon appear normal.  Vascular/Lymphatic: Normal appearance of the abdominal aorta. No enlarged retroperitoneal or mesenteric adenopathy. No enlarged pelvic or inguinal lymph nodes.  Other: There is no ascites or focal fluid collections within the abdomen or pelvis.  Musculoskeletal: The visualized osseous structures are unremarkable.  IMPRESSION: 1. No acute findings identified within the abdomen. 2. Bilateral end-stage kidneys. 3. Stable position of gastric band.   Electronically Signed   By: Kerby Moors M.D.   On: 11/29/2014 10:19    Scheduled Meds: . [START ON 11/30/2014] calcitRIOL  0.5 mcg Oral Q M,W,F-HD  . [START ON 12/05/2014] ferric gluconate (FERRLECIT/NULECIT) IV  62.5 mg Intravenous Q Wed-HD  . heparin  5,000 Units Subcutaneous 3 times per day  . multivitamin  1 tablet Oral QHS   Continuous Infusions:    Time spent: > 35 minutes    Velvet Bathe  [facility] Pager [phone] If 7PM-7AM, please contact night-coverage at [email or web address], password [facility] 11/29/2014, 5:27 PM  LOS: 1 day

## 2014-11-29 NOTE — Progress Notes (Signed)
°   11/29/14 1015  Clinical Encounter Type  Visited With Patient;Health care provider  Visit Type Initial;Spiritual support  Referral From Nurse  Spiritual Encounters  Spiritual Needs Prayer;Emotional  Stress Factors  Patient Stress Factors Health changes;Loss of control;Major life changes;Other (Comment)  Family Stress Factors Health changes (pain and status of his mother)   Responded to request to visit patient. Patient was sitting in chair beside the bed and was alert. Patient was in pain due to recent lap band surgery and arthritis in his knees. Patient stated he has dialysis yesterday and was waiting for test results as to whether he went home today or tomorrow. Patient said that it was all in God's timing and that he was trusting in God to work things out. Patient is strong in his faith and states that it will all work out. Patient experienced strong emotions when walking down the hall on his floor; as he flashed back to when his uncle passed away two years ago just a few rooms down from where he is currently. Patient stated that it was hard for him. Patient is concerned primarily about his mother, as he cares for her and lives with her. Patients mother is confined to the bed and is suffering from acute kidney failure. Patient and his sister take care of their mother. Patient feels as if he is letting others down by being in the hospital; however, he is here because he doesn't want to be a burden on his family. Patient wants to stay active so that he is able to care for others. I offered a listening ear, prayer, ministry of presence, and encouragement. Patient is ready to take care of himself so that he will be able to do for others. Patient is thinking about attending support group at dialysis center and would like to speak to others about his experience as he has been on dialysis since he was 50.  Drue Dun, Chaplain Intern 11/29/2014, 11:02 AM

## 2014-11-29 NOTE — Progress Notes (Signed)
General Surgery Note  LOS: 1 day  POD -     Assessment/Plan: 1.  Nausea and vomiting  Possibly due to swelling/bleeding around lap band/HH repair.  There are no stigmata on the CT scans (11/15/2014 and 2014-12-09) that he has had since surgery of a significant bleed or mass around the lap band.  Labs and VS remain stable.  For repeat CT scan today - he said that drinking contrast was "heavy", but he has not vomited the contrast.  2.  Pain control  Not sure why he has hurt so much.  Most patients are sore/tender for 3 to 7 days.  He is beyond that time.  3.  Lap band - Placed 11/12/2014 - D. Analyah Mcconnon 4.  Hiatal hernia - repaired at the same time as the lap bnad 5.  ERSD - Dr. Lenna Sciara. [facility] - MWF 4. HTN 5. 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 Gerkin 7. Arthritis right knee - Dr. Marlou Sa  8.  DVT prophylaxis - SQ Heparin   Active Problems:   End stage renal disease   Abdominal pain   Subjective:  Laying in bed.  He has been drinking his contrast for the CT.  He has about 1/2 down.  No vomiting, some nausea.  Still has some vague chest pain, no abdominal pain.  In room by himself.  Objective:   Filed Vitals:   11/29/14 0500  BP: 112/56  Pulse: 80  Temp: 98.1 F (36.7 C)  Resp: 18     Intake/Output from previous day:  04/27 0701 - 04/28 0700 In: 1560 [I.V.:1560] Out: 0   Intake/Output this shift:      Physical Exam:   General: Obese AA M who is alert and oriented.    HEENT: Normal. Pupils equal. .   Lungs: Clear.   Abdomen: Soft   Wound: Incisions look okay.   Lab Results:     Recent Labs  11/28/14 0638 11/29/14 0535  WBC 9.5 7.8  HGB 11.8* 11.4*  HCT 35.5* 35.2*  PLT 193 189    BMET    Recent Labs  11/28/14 0638 11/29/14 0535  NA 135 141  K 3.9 4.1  CL 98 102  CO2 22 25  GLUCOSE 88 70  BUN 25* 17  CREATININE 11.94* 11.07*  CALCIUM 8.0* 7.9*    PT/INR  No results for input(s): LABPROT, INR in the last 72  hours.  ABG  No results for input(s): PHART, HCO3 in the last 72 hours.  Invalid input(s): PCO2, PO2   Studies/Results:  Dg Abd 1 View  12-09-14   CLINICAL DATA:  Abdominal pain and emesis  EXAM: ABDOMEN - 1 VIEW  COMPARISON:  11/25/2014  FINDINGS: Previous laparoscopic gastric banding noted. Scattered air and contrast throughout the bowel. No obstruction pattern. Right pelvic calcifications consistent with venous phleboliths. No acute osseous finding. Obese body habitus.  IMPRESSION: No acute finding by plain radiography.  Negative for obstruction.   Electronically Signed   By: Jerilynn Mages.  Shick M.D.   On: 12/09/2014 21:40     Anti-infectives:   Anti-infectives    None      Alphonsa Overall, MD, FACS Pager: [phone] Surgery Office: [phone] 11/29/2014

## 2014-11-30 LAB — CBC WITH DIFFERENTIAL/PLATELET
Basophils Absolute: 0 10*3/uL (ref 0.0–0.1)
Basophils Relative: 1 % (ref 0–1)
EOS PCT: 5 % (ref 0–5)
Eosinophils Absolute: 0.3 10*3/uL (ref 0.0–0.7)
HCT: 33.6 % — ABNORMAL LOW (ref 39.0–52.0)
Hemoglobin: 11.1 g/dL — ABNORMAL LOW (ref 13.0–17.0)
Lymphocytes Relative: 28 % (ref 12–46)
Lymphs Abs: 2 10*3/uL (ref 0.7–4.0)
MCH: 30.7 pg (ref 26.0–34.0)
MCHC: 33 g/dL (ref 30.0–36.0)
MCV: 93.1 fL (ref 78.0–100.0)
MONOS PCT: 10 % (ref 3–12)
Monocytes Absolute: 0.7 10*3/uL (ref 0.1–1.0)
NEUTROS PCT: 56 % (ref 43–77)
Neutro Abs: 4.1 10*3/uL (ref 1.7–7.7)
Platelets: 192 10*3/uL (ref 150–400)
RBC: 3.61 MIL/uL — ABNORMAL LOW (ref 4.22–5.81)
RDW: 13.3 % (ref 11.5–15.5)
WBC: 7.2 10*3/uL (ref 4.0–10.5)

## 2014-11-30 LAB — COMPREHENSIVE METABOLIC PANEL
ALBUMIN: 3.5 g/dL (ref 3.5–5.2)
ALT: 11 U/L (ref 0–53)
ANION GAP: 18 — AB (ref 5–15)
AST: 13 U/L (ref 0–37)
Alkaline Phosphatase: 47 U/L (ref 39–117)
BUN: 29 mg/dL — AB (ref 6–23)
CHLORIDE: 101 mmol/L (ref 96–112)
CO2: 19 mmol/L (ref 19–32)
CREATININE: 15.3 mg/dL — AB (ref 0.50–1.35)
Calcium: 7.6 mg/dL — ABNORMAL LOW (ref 8.4–10.5)
GFR calc Af Amer: 4 mL/min — ABNORMAL LOW (ref >90)
GFR, EST NON AFRICAN AMERICAN: 4 mL/min — AB (ref >90)
GLUCOSE: 75 mg/dL (ref 70–99)
Potassium: 4.4 mmol/L (ref 3.5–5.1)
Sodium: 138 mmol/L (ref 135–145)
Total Bilirubin: 1.1 mg/dL (ref 0.3–1.2)
Total Protein: 6.7 g/dL (ref 6.0–8.3)

## 2014-11-30 MED ORDER — MORPHINE SULFATE 4 MG/ML IJ SOLN
INTRAMUSCULAR | Status: AC
  Administered 2014-11-30: 4 mg
  Filled 2014-11-30: qty 1

## 2014-11-30 NOTE — Procedures (Signed)
Patient seen on Hemodialysis. QB 350, UF goal 1.5L Treatment adjusted as needed.  Elmarie Shiley MD [facility] Office # [phone] Pager # [phone] 11:22 AM

## 2014-11-30 NOTE — Progress Notes (Signed)
General Surgery Note  LOS: 2 days  POD -     Assessment/Plan: 1.  Nausea and vomiting  Possibly due to swelling/bleeding around lap band/HH repair.  There are no stigmata on the CT scans (11/15/2014 and 11/27/2014) that he has had since surgery of a significant bleed or mass around the lap band.  Repeat CT scan 11/29/2014 - negative.  This is now 3 CT scans in two [name].  Agree with plans for clear liquids.  He should also be taking protein drinks per our lap band protocol.  He should not be on solid food.  If he can keep the clear liquids down, he can go home (Saturday?).  He is in hemodialysis right now.  He should see me back in the office in 2 [place].  2.  Pain control  Appears better.  He is walking the hall well.  3.  Lap band - Placed 11/12/2014 - D. Alira Fretwell 4.  Hiatal hernia - repaired at the same time as the lap bnad 5.  ERSD - Dr. Lenna Sciara. [facility] - MWF 4. HTN 5. 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 Gerkin 7. Arthritis right knee - Dr. Marlou Sa  8.  DVT prophylaxis - SQ Heparin   Active Problems:   End stage renal disease   Abdominal pain   Subjective:  In hemodialysis.  He says that he is doing better with water, but still having some pain.  Agree with plans for clear liquids.  Objective:   Filed Vitals:   11/30/14 1500  BP: 101/59  Pulse: 64  Temp:   Resp:      Intake/Output from previous day:     Intake/Output this shift:      Physical Exam:   General: Obese AA M who is alert and oriented.    HEENT: Normal. Pupils equal. .   Lungs: Clear.   Abdomen: Soft.  Nothing has changed on PE.   Wound: Incisions look okay.   Lab Results:     Recent Labs  11/29/14 0535 11/30/14 0500  WBC 7.8 7.2  HGB 11.4* 11.1*  HCT 35.2* 33.6*  PLT 189 192    BMET    Recent Labs  11/29/14 0535 11/30/14 0500  NA 141 138  K 4.1 4.4  CL 102 101  CO2 25 19  GLUCOSE 70 75  BUN 17 29*  CREATININE 11.07* 15.30*  CALCIUM 7.9* 7.6*     PT/INR  No results for input(s): LABPROT, INR in the last 72 hours.  ABG  No results for input(s): PHART, HCO3 in the last 72 hours.  Invalid input(s): PCO2, PO2   Studies/Results:  Ct Abdomen W Contrast  11/29/2014   CLINICAL DATA:  29 year old status post lap band surgery. Recent re- admission for nausea vomiting and increased abdominal pain.  EXAM: CT ABDOMEN WITH CONTRAST  TECHNIQUE: Multidetector CT imaging of the abdomen was performed using the standard protocol following bolus administration of intravenous contrast.  CONTRAST:  13mL OMNIPAQUE IOHEXOL 300 MG/ML  SOLN  COMPARISON:  11/25/2014  FINDINGS: Lower chest: There is no pleural effusion identified. The lung bases appear clear.  Hepatobiliary: No suspicious liver abnormality. Vicarious excretion of contrast material into the gallbladder noted. There is no biliary dilatation.  Pancreas: Normal.  Spleen: Normal appearance of the spleen.  Adrenals/Urinary Tract: The adrenal glands are both within normal limits. Bilateral end-stage kidneys containing numerous cysts from dialysis noted. Nonobstructing calculus within the mid right kidney measures 3 mm.  Stomach/Bowel: The gastric  band is again identified and appears to be in stable position. No complicating features are associated with the gastric band. The tubing associated with the band appears to be intact. The small bowel loops have a normal course and caliber. The appendix is visualized and appears normal. No evidence for bowel obstruction. The visualized portions of the colon appear normal.  Vascular/Lymphatic: Normal appearance of the abdominal aorta. No enlarged retroperitoneal or mesenteric adenopathy. No enlarged pelvic or inguinal lymph nodes.  Other: There is no ascites or focal fluid collections within the abdomen or pelvis.  Musculoskeletal: The visualized osseous structures are unremarkable.  IMPRESSION: 1. No acute findings identified within the abdomen. 2. Bilateral end-stage  kidneys. 3. Stable position of gastric band.   Electronically Signed   By: Kerby Moors M.D.   On: 11/29/2014 10:19     Anti-infectives:   Anti-infectives    None      Alphonsa Overall, MD, FACS Pager: [phone] Surgery Office: [phone] 11/30/2014

## 2014-11-30 NOTE — Progress Notes (Signed)
Patient ID: [name], male   DOB: 09-Dec-1985, 29 y.o.   MRN: [id number]  [facility] Progress Note   Assessment/ Plan:   1. Abdominal pain Nausea/vomiting /SP Gastric Band placement 11/12/14-  CT scan of the abdomen did not show any acute intra-abdominal pathology. Tried on sips and chips yesterday-advanced to clear liquids today 2. ESRD - HD MWF - plans for HD later today 3. Hypertension/volume - blood pressures well-regulated, low UF goal to avoid intradialytic blood pressure drops.  4. Anemia -  hemoglobin 11.4, acceptable, monitor off ESA  5. Metabolic bone disease -resume phosphorus binder when able to take adequately oral intake. 6. Morbid Obesity / Nutrition-sp Gastric Banding fu alb For supplement/  Subjective:   Doing better-isolated abdominal pain when trying to have bowel movement.    Objective:   BP 115/54 mmHg  Pulse 73  Temp(Src) 98 F (36.7 C) (Oral)  Resp 18  Ht 5\' 6"  (1.676 m)  Wt 132.904 kg (293 lb)  BMI 47.31 kg/m2  SpO2 100%  Physical Exam: Gen: Comfortably ambulating around hallways  CVS: Pulse regular in rate and rhythm  Resp: Clear to auscultation, no rales  Abd: Soft, obese, mildly tender over the epigastric area Ext: Trace lower extremity edema   Labs: BMET  Recent Labs Lab 11/25/14 1415 11/27/14 1745 11/28/14 0638 11/29/14 0535  NA 136 136 135 141  K 4.0 4.4 3.9 4.1  CL 97 96 98 102  CO2 21 19 22 25   GLUCOSE 95 88 88 70  BUN 29* 21 25* 17  CREATININE 12.35* 10.48* 11.94* 11.07*  CALCIUM 7.8* 9.1 8.0* 7.9*   CBC  Recent Labs Lab 11/25/14 1415 11/27/14 1745 11/28/14 0638 11/29/14 0535  WBC 9.6 7.9 9.5 7.8  NEUTROABS 7.9* 6.0 5.7 4.1  HGB 12.6* 13.6 11.8* 11.4*  HCT 37.8* 40.7 35.5* 35.2*  MCV 93.1 92.7 93.2 94.1  PLT 209 211 193 189    Medications:    . calcitRIOL  0.5 mcg Oral Q M,W,F-HD  . [START ON 12/05/2014] ferric gluconate (FERRLECIT/NULECIT) IV  62.5 mg Intravenous Q Wed-HD  . heparin  5,000  Units Subcutaneous 3 times per day  . multivitamin  1 tablet Oral QHS   Elmarie Shiley, MD 11/30/2014, 10:24 AM

## 2014-11-30 NOTE — Progress Notes (Signed)
Hemodialysis- Pt tolerated well without issue, total UF 1L. No complaints. Report given to [facility] on 6E.

## 2014-11-30 NOTE — Care Management (Signed)
CARE MANAGEMENT NOTE 11/30/2014  Patient:  [name], [name]   Account Number:  [id number]  Date Initiated:  11/30/2014  Documentation initiated by:  Lazette Estala  Subjective/Objective Assessment:   CM following for progression and d/c planning     Action/Plan:   No d/c needs identified at this time.   Anticipated DC Date:  12/03/2014   Anticipated DC Plan:  HOME/SELF CARE         Choice offered to / List presented to:             Status of service:  In process, will continue to follow Medicare Important Message given?  YES (If response is "NO", the following Medicare IM given date fields will be blank) Date Medicare IM given:  11/30/2014 Medicare IM given by:  Derrik Mceachern Date Additional Medicare IM given:   Additional Medicare IM given by:    Discharge Disposition:    Per UR Regulation:    If discussed at Long Length of Stay Meetings, dates discussed:    Comments:

## 2014-11-30 NOTE — Progress Notes (Signed)
[facility] PROGRESS NOTE  [name] [id number] DOB: June 06, 1986 DOA: 11/27/2014 PCP: Philis Fendt, MD  Assessment/Plan: Active Problems:   End stage renal disease -Nephrology consulted and currently managing hemodialysis    Abdominal pain - CT scan obtained and negative for acute findings - Advancing diet slowly agree with clear liquid diet - Gen. surgery on board and assisting with management - Continue supportive therapy   Code Status: full Family Communication: No family at bedside Disposition Plan:  Likely d/c within the next 1-2 days with continued improvement in condition.   Consultants:  General surgery  Nephrology  Procedures:  None  Antibiotics:  None  HPI/Subjective: Patient has no new complaints, no acute issues reported overnight.  Objective: Filed Vitals:   11/30/14 1539  BP: 102/68  Pulse: 62  Temp:   Resp: 20    Intake/Output Summary (Last 24 hours) at 11/30/14 1706 Last data filed at 11/30/14 1539  Gross per 24 hour  Intake      0 ml  Output   1000 ml  Net  -1000 ml   Filed Weights   11/29/14 2051 11/30/14 1100 11/30/14 1539  Weight: 132.904 kg (293 lb) 132 kg (291 lb 0.1 oz) 130.3 kg (287 lb 4.2 oz)    Exam:   General:  Patient in no acute distress, alert and awake, looks uncomfortable  Cardiovascular: Regular rate and rhythm, no murmurs or rubs  Respiratory: Here to auscultation bilaterally, no wheezes  Abdomen: Soft, generalized discomfort with palpation, nondistended  Musculoskeletal: No cyanosis or clubbing   Data Reviewed: Basic Metabolic Panel:  Recent Labs Lab 11/25/14 1415 11/27/14 1745 11/28/14 0638 11/29/14 0535 11/30/14 0500  NA 136 136 135 141 138  K 4.0 4.4 3.9 4.1 4.4  CL 97 96 98 102 101  CO2 21 19 22 25 19   GLUCOSE 95 88 88 70 75  BUN 29* 21 25* 17 29*  CREATININE 12.35* 10.48* 11.94* 11.07* 15.30*  CALCIUM 7.8* 9.1 8.0* 7.9* 7.6*   Liver Function Tests:  Recent Labs Lab  11/25/14 1415 11/27/14 1745 11/28/14 0638 11/29/14 0535 11/30/14 0500  AST 18 19 14 14 13   ALT 11 13 11 11 11   ALKPHOS 54 56 46 44 47  BILITOT 0.8 0.8 0.6 0.6 1.1  PROT 7.6 7.8 6.7 6.8 6.7  ALBUMIN 3.8 4.1 3.5 3.4* 3.5    Recent Labs Lab 11/25/14 1415 11/27/14 1745  LIPASE 47 61*   No results for input(s): AMMONIA in the last 168 hours. CBC:  Recent Labs Lab 11/25/14 1415 11/27/14 1745 11/28/14 0638 11/29/14 0535 11/30/14 0500  WBC 9.6 7.9 9.5 7.8 7.2  NEUTROABS 7.9* 6.0 5.7 4.1 4.1  HGB 12.6* 13.6 11.8* 11.4* 11.1*  HCT 37.8* 40.7 35.5* 35.2* 33.6*  MCV 93.1 92.7 93.2 94.1 93.1  PLT 209 211 193 189 192   Cardiac Enzymes: No results for input(s): CKTOTAL, CKMB, CKMBINDEX, TROPONINI in the last 168 hours. BNP (last 3 results) No results for input(s): BNP in the last 8760 hours.  ProBNP (last 3 results)  Recent Labs  01/10/14 1152 04/09/14 1658  PROBNP 958.2* 739.9*    CBG: No results for input(s): GLUCAP in the last 168 hours.  No results found for this or any previous visit (from the past 240 hour(s)).   Studies: Ct Abdomen W Contrast  11/29/2014   CLINICAL DATA:  29 year old status post lap band surgery. Recent re- admission for nausea vomiting and increased abdominal pain.  EXAM: CT ABDOMEN WITH CONTRAST  TECHNIQUE: Multidetector CT imaging of the abdomen was performed using the standard protocol following bolus administration of intravenous contrast.  CONTRAST:  121mL OMNIPAQUE IOHEXOL 300 MG/ML  SOLN  COMPARISON:  11/25/2014  FINDINGS: Lower chest: There is no pleural effusion identified. The lung bases appear clear.  Hepatobiliary: No suspicious liver abnormality. Vicarious excretion of contrast material into the gallbladder noted. There is no biliary dilatation.  Pancreas: Normal.  Spleen: Normal appearance of the spleen.  Adrenals/Urinary Tract: The adrenal glands are both within normal limits. Bilateral end-stage kidneys containing numerous cysts from  dialysis noted. Nonobstructing calculus within the mid right kidney measures 3 mm.  Stomach/Bowel: The gastric band is again identified and appears to be in stable position. No complicating features are associated with the gastric band. The tubing associated with the band appears to be intact. The small bowel loops have a normal course and caliber. The appendix is visualized and appears normal. No evidence for bowel obstruction. The visualized portions of the colon appear normal.  Vascular/Lymphatic: Normal appearance of the abdominal aorta. No enlarged retroperitoneal or mesenteric adenopathy. No enlarged pelvic or inguinal lymph nodes.  Other: There is no ascites or focal fluid collections within the abdomen or pelvis.  Musculoskeletal: The visualized osseous structures are unremarkable.  IMPRESSION: 1. No acute findings identified within the abdomen. 2. Bilateral end-stage kidneys. 3. Stable position of gastric band.   Electronically Signed   By: Kerby Moors M.D.   On: 11/29/2014 10:19    Scheduled Meds: . calcitRIOL  0.5 mcg Oral Q M,W,F-HD  . [START ON 12/05/2014] ferric gluconate (FERRLECIT/NULECIT) IV  62.5 mg Intravenous Q Wed-HD  . heparin  5,000 Units Subcutaneous 3 times per day  . multivitamin  1 tablet Oral QHS   Continuous Infusions:    Time spent: > 35 minutes    Velvet Bathe  [facility] Pager [phone] If 7PM-7AM, please contact night-coverage at [email or web address], password [facility] 11/30/2014, 5:06 PM  LOS: 2 days

## 2014-12-01 LAB — COMPREHENSIVE METABOLIC PANEL
ALK PHOS: 53 U/L (ref 39–117)
ALT: 29 U/L (ref 0–53)
AST: 54 U/L — ABNORMAL HIGH (ref 0–37)
Albumin: 3.3 g/dL — ABNORMAL LOW (ref 3.5–5.2)
Anion gap: 17 — ABNORMAL HIGH (ref 5–15)
BUN: 13 mg/dL (ref 6–23)
CHLORIDE: 98 mmol/L (ref 96–112)
CO2: 22 mmol/L (ref 19–32)
CREATININE: 8.46 mg/dL — AB (ref 0.50–1.35)
Calcium: 7.7 mg/dL — ABNORMAL LOW (ref 8.4–10.5)
GFR calc non Af Amer: 8 mL/min — ABNORMAL LOW (ref >90)
GFR, EST AFRICAN AMERICAN: 9 mL/min — AB (ref >90)
Glucose, Bld: 63 mg/dL — ABNORMAL LOW (ref 70–99)
Potassium: 4 mmol/L (ref 3.5–5.1)
Sodium: 137 mmol/L (ref 135–145)
Total Bilirubin: 1.1 mg/dL (ref 0.3–1.2)
Total Protein: 6.3 g/dL (ref 6.0–8.3)

## 2014-12-01 LAB — CBC WITH DIFFERENTIAL/PLATELET
BASOS PCT: 1 % (ref 0–1)
Basophils Absolute: 0 10*3/uL (ref 0.0–0.1)
EOS ABS: 0.3 10*3/uL (ref 0.0–0.7)
Eosinophils Relative: 5 % (ref 0–5)
HCT: 32.2 % — ABNORMAL LOW (ref 39.0–52.0)
HEMOGLOBIN: 10.7 g/dL — AB (ref 13.0–17.0)
Lymphocytes Relative: 29 % (ref 12–46)
Lymphs Abs: 1.7 10*3/uL (ref 0.7–4.0)
MCH: 30.9 pg (ref 26.0–34.0)
MCHC: 33.2 g/dL (ref 30.0–36.0)
MCV: 93.1 fL (ref 78.0–100.0)
MONOS PCT: 10 % (ref 3–12)
Monocytes Absolute: 0.6 10*3/uL (ref 0.1–1.0)
NEUTROS ABS: 3.4 10*3/uL (ref 1.7–7.7)
NEUTROS PCT: 55 % (ref 43–77)
PLATELETS: 166 10*3/uL (ref 150–400)
RBC: 3.46 MIL/uL — ABNORMAL LOW (ref 4.22–5.81)
RDW: 13.4 % (ref 11.5–15.5)
WBC: 6.1 10*3/uL (ref 4.0–10.5)

## 2014-12-01 MED ORDER — OXYCODONE HCL 5 MG PO TABS: 5.0000 mg | ORAL_TABLET | ORAL | Status: DC | PRN

## 2014-12-01 NOTE — Discharge Summary (Signed)
Physician Discharge Summary  [name] [id number] DOB: 12-24-1985 DOA: 11/27/2014  PCP: Philis Fendt, MD  Admit date: 11/27/2014 Discharge date: 12/01/2014  Time spent: > 35 minutes  Recommendations for Outpatient Follow-up:  1.  Pt to f/u with General surgeon  Discharge Diagnoses:  Active Problems:   End stage renal disease   Abdominal pain   Discharge Condition: stable  Diet recommendation: renal liquid diet  Filed Weights   11/29/14 2051 11/30/14 1100 11/30/14 1539  Weight: 132.904 kg (293 lb) 132 kg (291 lb 0.1 oz) 130.3 kg (287 lb 4.2 oz)    History of present illness:  From original HPI: 29 y.o. year old male with significant past medical history of morbid obesity s/p gastric banding 11/12/14, ESRD on HD MWF, GERD, hyperparathyroidism presenting with abd pain, nausea, vomiting. Patient reports recurrent abdominal pain and uncontrolled nausea and vomiting over the past 2-3 days. Patient noted to have had a gastric band placed approximately 2 [place] ago by general surgery. Has been unable to tolerate by mouth intake. Recurrent episodes of nausea and vomiting. No diarrhea and no fevers or chills. Was seen by general surgery earlier today and was redirected to the ER out of concern for possible complications of patient's gastric band  Hospital Course:  Active Problems:  End stage renal disease -Nephrology consulted and managed hemodialysis - Pt to continue routine HD sessions after d/c   Abdominal pain - CT scan obtained and negative for acute findings - Tolerating liquid diet. - Gen. surgery on board and assisting with management - Continue supportive therapy  Procedures:  None  Consultations: Nephrology  Discharge Exam: Filed Vitals:   12/01/14 0901  BP: 106/54  Pulse: 89  Temp: 98.1 F (36.7 C)  Resp: 17    General: Pt in nad, alert and awake Cardiovascular: no cyanosis Respiratory: breathing comfortably on room air, no increased  wob  Discharge Instructions   Discharge Instructions    Call MD for:  difficulty breathing, headache or visual disturbances    Complete by:  As directed      Call MD for:  persistant dizziness or light-headedness    Complete by:  As directed      Call MD for:  temperature >100.4    Complete by:  As directed      Diet - low sodium heart healthy    Complete by:  As directed      Discharge instructions    Complete by:  As directed   Please be sure to follow up with your General surgeon in 1 wk post d/c.  Continue routine dialysis with nephrology     Increase activity slowly    Complete by:  As directed           Current Discharge Medication List    CONTINUE these medications which have CHANGED   Details  oxyCODONE (OXY IR/ROXICODONE) 5 MG immediate release tablet Take 1 tablet (5 mg total) by mouth every 3 (three) hours as needed for moderate pain. Qty: 30 tablet, Refills: 0      CONTINUE these medications which have NOT CHANGED   Details  omeprazole (PRILOSEC) 20 MG capsule Take 20 mg by mouth daily as needed (for heartburn).     ondansetron (ZOFRAN ODT) 4 MG disintegrating tablet Take 1 tablet (4 mg total) by mouth every 8 (eight) hours as needed for nausea or vomiting. Qty: 10 tablet, Refills: 0    sevelamer carbonate (RENVELA) 2.4 G PACK Take 2.4 g by mouth 3 (  three) times daily with meals.       No Known Allergies    The results of significant diagnostics from this hospitalization (including imaging, microbiology, ancillary and laboratory) are listed below for reference.    Significant Diagnostic Studies: Dg Abd 1 View  11/27/2014   CLINICAL DATA:  Abdominal pain and emesis  EXAM: ABDOMEN - 1 VIEW  COMPARISON:  11/25/2014  FINDINGS: Previous laparoscopic gastric banding noted. Scattered air and contrast throughout the bowel. No obstruction pattern. Right pelvic calcifications consistent with venous phleboliths. No acute osseous finding. Obese body habitus.   IMPRESSION: No acute finding by plain radiography.  Negative for obstruction.   Electronically Signed   By: Jerilynn Mages.  Shick M.D.   On: 11/27/2014 21:40   Dg Abd 1 View  11/12/2014   CLINICAL DATA:  Abdominal pain scratch the persistent abdominal pain. Lab and surgery today.  EXAM: ABDOMEN - 1 VIEW  COMPARISON:  None.  FINDINGS: The lap band is positioned at 33 degrees from the horizontal, within normal limits. Gas is present within the more distal stomach. The bowel gas pattern is otherwise normal. The heart size scratch the the heart is mildly enlarged.  IMPRESSION: 1. Interval placement of lap band without radiographic evidence for complication. 2. Stable cardiomegaly without failure   Electronically Signed   By: San Morelle M.D.   On: 11/12/2014 17:20   Ct Abdomen W Contrast  11/29/2014   CLINICAL DATA:  29 year old status post lap band surgery. Recent re- admission for nausea vomiting and increased abdominal pain.  EXAM: CT ABDOMEN WITH CONTRAST  TECHNIQUE: Multidetector CT imaging of the abdomen was performed using the standard protocol following bolus administration of intravenous contrast.  CONTRAST:  119mL OMNIPAQUE IOHEXOL 300 MG/ML  SOLN  COMPARISON:  11/25/2014  FINDINGS: Lower chest: There is no pleural effusion identified. The lung bases appear clear.  Hepatobiliary: No suspicious liver abnormality. Vicarious excretion of contrast material into the gallbladder noted. There is no biliary dilatation.  Pancreas: Normal.  Spleen: Normal appearance of the spleen.  Adrenals/Urinary Tract: The adrenal glands are both within normal limits. Bilateral end-stage kidneys containing numerous cysts from dialysis noted. Nonobstructing calculus within the mid right kidney measures 3 mm.  Stomach/Bowel: The gastric band is again identified and appears to be in stable position. No complicating features are associated with the gastric band. The tubing associated with the band appears to be intact. The small bowel  loops have a normal course and caliber. The appendix is visualized and appears normal. No evidence for bowel obstruction. The visualized portions of the colon appear normal.  Vascular/Lymphatic: Normal appearance of the abdominal aorta. No enlarged retroperitoneal or mesenteric adenopathy. No enlarged pelvic or inguinal lymph nodes.  Other: There is no ascites or focal fluid collections within the abdomen or pelvis.  Musculoskeletal: The visualized osseous structures are unremarkable.  IMPRESSION: 1. No acute findings identified within the abdomen. 2. Bilateral end-stage kidneys. 3. Stable position of gastric band.   Electronically Signed   By: Kerby Moors M.D.   On: 11/29/2014 10:19   Ct Abdomen Pelvis W Contrast  11/25/2014   CLINICAL DATA:  Left upper and lower quadrant abdominal pain associated with nausea and vomiting since yesterday. Prior laparoscopic banding. Current history of end- stage renal disease on hemodialysis.  EXAM: CT ABDOMEN AND PELVIS WITH CONTRAST  TECHNIQUE: Multidetector CT imaging of the abdomen and pelvis was performed using the standard protocol following bolus administration of intravenous contrast.  CONTRAST:  142mL OMNIPAQUE  IOHEXOL 300 MG/ML IV. Oral contrast was also administered.  COMPARISON:  11/15/2014, 04/09/2010.  FINDINGS: Liver: Mild diffuse hepatic steatosis without focal hepatic parenchymal abnormality.  Biliary: Normal-appearing gallbladder without calcified gallstones. No biliary ductal dilation.  Spleen:  Normal in size and appearance.  Pancreas:  Normal in appearance.  No pancreatic ductal dilation.  Adrenal glands:  Normal in appearance.  Genitourinary: Atrophic kidneys containing numerous cysts and scattered cortical calcifications. Largest cyst approximates 3.8 cm arising from the upper pole of the left kidney. Urinary bladder completely decompressed as the patient does not produce urine.  Prostate gland and seminal vesicles normal in size and appearance for age.   Gastrointestinal: Laparoscopic band appropriately positioned, unchanged from the CT 2 [place] ago, and there is no discontinuity of the catheter extending from the band to the port. Normal-appearing small bowel and colon. Normal appendix in the right mid abdomen as the cecum is positioned in the right upper quadrant.  Vascular:  No visible aortoiliofemoral atherosclerosis.  Lymphatic:  No pathologic lymphadenopathy in the abdomen or pelvis.  Ascites: Absent.  Musculoskeletal: Mild degenerative changes involving the lower thoracic spine. Renal osteodystrophy.  Other findings: Minimal ecchymosis or possible postoperative fat necrosis at the port site in the subcutaneous tissues of the right abdominal wall. Increased attenuation in the anterior abdominal wall of the right upper abdomen, well above the port, interspersed with fat.  Visualized lower thorax: Heart size normal.  Lung bases clear.  IMPRESSION: 1. No acute abnormalities involving the abdomen or pelvis. 2. No visible complications involving the laparoscopic band or its components. 3. Mild diffuse hepatic steatosis. 4. Increased attenuation in the subcutaneous fat of the right upper abdominal wall, present on the examination 10 days ago and unchanged. Does the patient take subcutaneous injections? If not, focal fat necrosis is suspected.   Electronically Signed   By: Evangeline Dakin M.D.   On: 11/25/2014 16:54   Ct Abdomen Pelvis W Contrast  11/15/2014   CLINICAL DATA:  Epigastric pain and nausea. Lap band procedure 11/12/2014. Increased white count.  EXAM: CT ABDOMEN AND PELVIS WITH CONTRAST  TECHNIQUE: Multidetector CT imaging of the abdomen and pelvis was performed using the standard protocol following bolus administration of intravenous contrast.  CONTRAST:  161mL OMNIPAQUE IOHEXOL 300 MG/ML  SOLN  COMPARISON:  04/09/2010  FINDINGS: BODY WALL: High-density reticulation in the right upper quadrant subcutaneous fat correlating with the laparoscopic port  site. No rim enhancing abdominal wall fluid collection.  LOWER CHEST: Atelectasis in the lower lungs.  ABDOMEN/PELVIS:  Liver: Hepatic steatosis.  No focal abnormality.  Biliary: No evidence of biliary obstruction or stone.  Pancreas: Unremarkable.  Spleen: Unremarkable.  Adrenals: Unremarkable.  Kidneys and ureters: Small polycystic kidneys consistent with dialysis history. No evidence for solid mass.  Bladder: Decompressed.  Reproductive: No pathologic findings.  Bowel: Lap band has a normal angle. There is no infiltration or fluid collection around the lap band to suggest abscess or gastric erosions/perforation. A small amount of oral contrast is noted in the proximal stomach. No bowel obstruction. Appendicolith without appendicitis.  Retroperitoneum: No mass or adenopathy.  Peritoneum: No ascites or pneumoperitoneum.  Vascular: No acute abnormality.  OSSEOUS: Dense bones with subchondral lucency along the sacroiliac joints.  IMPRESSION: 1. No acute findings.  No evidence of lap band complication. 2. Small hemorrhage in the right upper quadrant port site. 3. Bibasilar atelectasis. 4. 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 5. Renal osteodystrophy. 6. Hepatic steatosis.   Electronically Signed   By: Angelica Chessman  Watts M.D.   On: 11/15/2014 03:52   Dg Abd Acute W/chest  11/15/2014   CLINICAL DATA:  Initial evaluation for acute epigastric pain. Status post recent lap band placement  EXAM: DG ABDOMEN ACUTE W/ 1V CHEST  COMPARISON:  Prior study from 11/12/2014  FINDINGS: Cardiac and mediastinal silhouettes within normal limits.  Lungs are mildly hypoinflated. There is secondary mild bibasilar atelectasis. No focal infiltrate, pulmonary edema, or pleural effusion.  Surgical clips overlie the lower neck.  Gastric lap band in place. No interval complication. Multiple mildly prominent gas-filled loops of small bowel seen within the left abdomen. Few scattered internal air-fluid levels. Air seen within the nondilated  gas-filled descending colon as well. Findings may reflect postoperative ileus or possibly developing/early small bowel obstruction. No free air. No soft tissue mass or abnormal calcification.  No acute osseus abnormality.  IMPRESSION: 1. Multiple mildly prominent gas-filled loops of small bowel with a few associated internal air-fluid levels. Findings favored to reflect postoperative ileus given history of recent lap band procedure. Close interval follow-up recommended if there is clinical concern for possible early/developing small bowel obstruction. 2. Gastric lap band in place without complication. 3. No active cardiopulmonary disease.   Electronically Signed   By: Jeannine Boga M.D.   On: 11/15/2014 00:55    Microbiology: No results found for this or any previous visit (from the past 240 hour(s)).   Labs: Basic Metabolic Panel:  Recent Labs Lab 11/27/14 1745 11/28/14 0638 11/29/14 0535 11/30/14 0500 12/01/14 0805  NA 136 135 141 138 137  K 4.4 3.9 4.1 4.4 4.0  CL 96 98 102 101 98  CO2 19 22 25 19 22   GLUCOSE 88 88 70 75 63*  BUN 21 25* 17 29* 13  CREATININE 10.48* 11.94* 11.07* 15.30* 8.46*  CALCIUM 9.1 8.0* 7.9* 7.6* 7.7*   Liver Function Tests:  Recent Labs Lab 11/27/14 1745 11/28/14 0638 11/29/14 0535 11/30/14 0500 12/01/14 0805  AST 19 14 14 13  54*  ALT 13 11 11 11 29   ALKPHOS 56 46 44 47 53  BILITOT 0.8 0.6 0.6 1.1 1.1  PROT 7.8 6.7 6.8 6.7 6.3  ALBUMIN 4.1 3.5 3.4* 3.5 3.3*    Recent Labs Lab 11/25/14 1415 11/27/14 1745  LIPASE 47 61*   No results for input(s): AMMONIA in the last 168 hours. CBC:  Recent Labs Lab 11/27/14 1745 11/28/14 0638 11/29/14 0535 11/30/14 0500 12/01/14 0805  WBC 7.9 9.5 7.8 7.2 6.1  NEUTROABS 6.0 5.7 4.1 4.1 3.4  HGB 13.6 11.8* 11.4* 11.1* 10.7*  HCT 40.7 35.5* 35.2* 33.6* 32.2*  MCV 92.7 93.2 94.1 93.1 93.1  PLT 211 193 189 192 166   Cardiac Enzymes: No results for input(s): CKTOTAL, CKMB, CKMBINDEX,  TROPONINI in the last 168 hours. BNP: BNP (last 3 results) No results for input(s): BNP in the last 8760 hours.  ProBNP (last 3 results)  Recent Labs  01/10/14 1152 04/09/14 1658  PROBNP 958.2* 739.9*    CBG: No results for input(s): GLUCAP in the last 168 hours.     Signed:  Velvet Bathe  [facility] 12/01/2014, 10:46 AM

## 2014-12-01 NOTE — Progress Notes (Signed)
Pt D/C A&O X 4 VVS,pt received discharge instructions verbalized understanding,NT walked pt out to [employer] cont to [employer].

## 2014-12-04 ENCOUNTER — Encounter: Payer: Medicare Other | Attending: Surgery

## 2014-12-04 DIAGNOSIS — Z713 Dietary counseling and surveillance: Secondary | ICD-10-CM | POA: Insufficient documentation

## 2014-12-04 DIAGNOSIS — Z6841 Body Mass Index (BMI) 40.0 and over, adult: Secondary | ICD-10-CM | POA: Insufficient documentation

## 2014-12-04 NOTE — Progress Notes (Signed)
Bariatric Class:  Appt start time: 1530 end time:  1630.  2 Week Post-Operative Nutrition Class  Patient was seen on 12/04/2014 for Post-Operative Nutrition education at [facility].   Surgery date: 11/12/2014 Surgery type: LAGB Start weight at [facility]: 294.5 lbs Weight today: 286.0 lbs Weight change: 8.5 lbs  TANITA  BODY COMP RESULTS  10/29/14 12/04/14   BMI (kg/m^2) 49 47.6   Fat Mass (lbs) 214.5 139.0   Fat Free Mass (lbs) 80 147.0   Total Body Water (lbs) 58.5 107.5    The following the learning objectives were met by the patient during this course:  Identifies Phase 3A (Soft, High Proteins) Dietary Goals and will begin from 2 [place] post-operatively to 2 months post-operatively  Identifies appropriate sources of fluids and proteins   States protein recommendations and appropriate sources post-operatively  Identifies the need for appropriate texture modifications, mastication, and bite sizes when consuming solids  Identifies appropriate multivitamin and calcium sources post-operatively  Describes the need for physical activity post-operatively and will follow MD recommendations  States when to call healthcare provider regarding medication questions or post-operative complications  Handouts given during class include:  Phase 3A: Soft, High Protein Diet Handout  Follow-Up Plan: Patient will follow-up at [facility] in 6 [place] for 2 month post-op nutrition visit for diet advancement per MD.

## 2014-12-05 ENCOUNTER — Inpatient Hospital Stay (HOSPITAL_COMMUNITY)
Admission: EM | Admit: 2014-12-05 | Discharge: 2014-12-08 | DRG: 391 | Disposition: A | Discharge status: Home / Self Care | Payer: Medicare Other | Attending: Surgery | Admitting: Surgery

## 2014-12-05 ENCOUNTER — Emergency Department (HOSPITAL_COMMUNITY): Payer: Medicare Other

## 2014-12-05 ENCOUNTER — Encounter (HOSPITAL_COMMUNITY): Payer: Self-pay

## 2014-12-05 DIAGNOSIS — R52 Pain, unspecified: Secondary | ICD-10-CM | POA: Diagnosis present

## 2014-12-05 DIAGNOSIS — Z96651 Presence of right artificial knee joint: Secondary | ICD-10-CM | POA: Diagnosis present

## 2014-12-05 DIAGNOSIS — Z992 Dependence on renal dialysis: Secondary | ICD-10-CM | POA: Diagnosis not present

## 2014-12-05 DIAGNOSIS — D649 Anemia, unspecified: Secondary | ICD-10-CM | POA: Diagnosis present

## 2014-12-05 DIAGNOSIS — Z9884 Bariatric surgery status: Secondary | ICD-10-CM | POA: Diagnosis not present

## 2014-12-05 DIAGNOSIS — Z79899 Other long term (current) drug therapy: Secondary | ICD-10-CM | POA: Diagnosis not present

## 2014-12-05 DIAGNOSIS — N2581 Secondary hyperparathyroidism of renal origin: Secondary | ICD-10-CM | POA: Diagnosis present

## 2014-12-05 DIAGNOSIS — I120 Hypertensive chronic kidney disease with stage 5 chronic kidney disease or end stage renal disease: Secondary | ICD-10-CM | POA: Diagnosis present

## 2014-12-05 DIAGNOSIS — Z6841 Body Mass Index (BMI) 40.0 and over, adult: Secondary | ICD-10-CM

## 2014-12-05 DIAGNOSIS — K219 Gastro-esophageal reflux disease without esophagitis: Secondary | ICD-10-CM | POA: Diagnosis present

## 2014-12-05 DIAGNOSIS — K3189 Other diseases of stomach and duodenum: Principal | ICD-10-CM | POA: Diagnosis present

## 2014-12-05 DIAGNOSIS — N186 End stage renal disease: Secondary | ICD-10-CM | POA: Diagnosis present

## 2014-12-05 DIAGNOSIS — R1013 Epigastric pain: Secondary | ICD-10-CM | POA: Diagnosis present

## 2014-12-05 DIAGNOSIS — R109 Unspecified abdominal pain: Secondary | ICD-10-CM

## 2014-12-05 DIAGNOSIS — F17210 Nicotine dependence, cigarettes, uncomplicated: Secondary | ICD-10-CM | POA: Diagnosis present

## 2014-12-05 LAB — COMPREHENSIVE METABOLIC PANEL
ALT: 51 U/L (ref 17–63)
AST: 42 U/L — ABNORMAL HIGH (ref 15–41)
Albumin: 3.7 g/dL (ref 3.5–5.0)
Alkaline Phosphatase: 60 U/L (ref 38–126)
Anion gap: 14 (ref 5–15)
BILIRUBIN TOTAL: 0.9 mg/dL (ref 0.3–1.2)
BUN: 5 mg/dL — ABNORMAL LOW (ref 6–20)
CO2: 24 mmol/L (ref 22–32)
Calcium: 10.7 mg/dL — ABNORMAL HIGH (ref 8.9–10.3)
Chloride: 100 mmol/L — ABNORMAL LOW (ref 101–111)
Creatinine, Ser: 5.25 mg/dL — ABNORMAL HIGH (ref 0.61–1.24)
GFR, EST AFRICAN AMERICAN: 16 mL/min — AB (ref >60)
GFR, EST NON AFRICAN AMERICAN: 13 mL/min — AB (ref >60)
GLUCOSE: 87 mg/dL (ref 70–99)
POTASSIUM: 3.5 mmol/L (ref 3.5–5.1)
Sodium: 138 mmol/L (ref 135–145)
Total Protein: 7.5 g/dL (ref 6.5–8.1)

## 2014-12-05 LAB — CBC WITH DIFFERENTIAL/PLATELET
BASOS ABS: 0 10*3/uL (ref 0.0–0.1)
Basophils Relative: 1 % (ref 0–1)
EOS ABS: 0.2 10*3/uL (ref 0.0–0.7)
Eosinophils Relative: 3 % (ref 0–5)
HCT: 35.2 % — ABNORMAL LOW (ref 39.0–52.0)
Hemoglobin: 11.8 g/dL — ABNORMAL LOW (ref 13.0–17.0)
LYMPHS ABS: 1.6 10*3/uL (ref 0.7–4.0)
LYMPHS PCT: 26 % (ref 12–46)
MCH: 31.1 pg (ref 26.0–34.0)
MCHC: 33.5 g/dL (ref 30.0–36.0)
MCV: 92.6 fL (ref 78.0–100.0)
Monocytes Absolute: 0.9 10*3/uL (ref 0.1–1.0)
Monocytes Relative: 14 % — ABNORMAL HIGH (ref 3–12)
NEUTROS PCT: 56 % (ref 43–77)
Neutro Abs: 3.4 10*3/uL (ref 1.7–7.7)
PLATELETS: 190 10*3/uL (ref 150–400)
RBC: 3.8 MIL/uL — ABNORMAL LOW (ref 4.22–5.81)
RDW: 13.5 % (ref 11.5–15.5)
WBC: 6.1 10*3/uL (ref 4.0–10.5)

## 2014-12-05 LAB — I-STAT TROPONIN, ED: Troponin i, poc: 0 ng/mL (ref 0.00–0.08)

## 2014-12-05 LAB — LIPASE, BLOOD
LIPASE: 40 U/L (ref 22–51)
Lipase: 40 U/L (ref 22–51)

## 2014-12-05 MED ORDER — SODIUM CHLORIDE 0.9 % IV BOLUS (SEPSIS)
500.0000 mL | Freq: Once | INTRAVENOUS | Status: AC
  Administered 2014-12-05: 500 mL via INTRAVENOUS

## 2014-12-05 MED ORDER — ONDANSETRON HCL 4 MG/2ML IJ SOLN
4.0000 mg | Freq: Four times a day (QID) | INTRAMUSCULAR | Status: DC | PRN
Start: 2014-12-05 — End: 2014-12-08
  Administered 2014-12-06 – 2014-12-08 (×5): 4 mg via INTRAVENOUS
  Filled 2014-12-05 (×5): qty 2

## 2014-12-05 MED ORDER — HYDROMORPHONE HCL 1 MG/ML IJ SOLN
1.0000 mg | Freq: Once | INTRAMUSCULAR | Status: AC
  Administered 2014-12-05: 1 mg via INTRAVENOUS
  Filled 2014-12-05: qty 1

## 2014-12-05 MED ORDER — PANTOPRAZOLE SODIUM 40 MG IV SOLR
40.0000 mg | Freq: Once | INTRAVENOUS | Status: AC
  Administered 2014-12-05: 40 mg via INTRAVENOUS
  Filled 2014-12-05: qty 40

## 2014-12-05 MED ORDER — SODIUM CHLORIDE 0.9 % IV SOLN
INTRAVENOUS | Status: DC
  Administered 2014-12-06 (×2): via INTRAVENOUS
  Administered 2014-12-07: 500 mL via INTRAVENOUS

## 2014-12-05 MED ORDER — PROMETHAZINE HCL 25 MG/ML IJ SOLN
12.5000 mg | Freq: Four times a day (QID) | INTRAMUSCULAR | Status: DC | PRN
  Administered 2014-12-06 – 2014-12-07 (×3): 12.5 mg via INTRAVENOUS
  Filled 2014-12-05 (×3): qty 1

## 2014-12-05 MED ORDER — PANTOPRAZOLE SODIUM 40 MG IV SOLR
40.0000 mg | Freq: Every day | INTRAVENOUS | Status: DC
  Administered 2014-12-06 – 2014-12-07 (×3): 40 mg via INTRAVENOUS
  Filled 2014-12-05 (×4): qty 40

## 2014-12-05 MED ORDER — ONDANSETRON HCL 4 MG/2ML IJ SOLN
4.0000 mg | Freq: Once | INTRAMUSCULAR | Status: AC
  Administered 2014-12-05: 4 mg via INTRAVENOUS
  Filled 2014-12-05: qty 2

## 2014-12-05 MED ORDER — HYDROMORPHONE HCL 1 MG/ML IJ SOLN
1.0000 mg | INTRAMUSCULAR | Status: DC | PRN
  Administered 2014-12-06 – 2014-12-08 (×20): 1 mg via INTRAVENOUS
  Filled 2014-12-05 (×17): qty 1

## 2014-12-05 NOTE — ED Notes (Signed)
Patient transported to X-ray 

## 2014-12-05 NOTE — ED Provider Notes (Signed)
CSN: [id number]     Arrival date & time 12/05/14  [id number] History   First MD Initiated Contact with Patient 12/05/14 1842     Chief Complaint  Patient presents with  . Abdominal Pain     (Consider location/radiation/quality/duration/timing/severity/associated sxs/prior Treatment) Patient is a 29 y.o. male presenting with abdominal pain. The history is provided by the patient (the pt complains of abd pain.  he had a lap band placed april 11th.  recently admitted for abd pain).  Abdominal Pain Pain location:  Epigastric Pain quality: aching   Pain radiates to:  Does not radiate Pain severity:  Severe Onset quality:  Gradual Timing:  Constant Progression:  Worsening Chronicity:  Recurrent Associated symptoms: no chest pain, no cough, no diarrhea, no fatigue and no hematuria     Past Medical History  Diagnosis Date  . Hyperparathyroidism   . Morbid obesity   . Arthritis   . Complication of anesthesia     A little while to wake up after knee surgery in 2008  . Family history of anesthesia complication     mom slow to wake up  . Headache(784.0)     occasionally  . Dizziness     when coming off of dialysis  . Joint pain   . Joint swelling   . GERD (gastroesophageal reflux disease)     takes Omeprazole as needed  . Hypertension   . Renal disorder     MWF and goes to [facility]  . Renal insufficiency    Past Surgical History  Procedure Laterality Date  . Av fistula placement Bilateral     Uses Right arm  . Esophagogastroduodenoscopy endoscopy    . Knee arthroscopy Left 2007  . Parathyroidectomy N/A 03/30/2013    Procedure: TOTAL PARATHYROIDECTOMY WITH AUTOTRANSPLANT;  Surgeon: Earnstine Regal, MD;  Location: [place];  Service: General;  Laterality: N/A;  Autotransplant to left lower arm.  . Total knee arthroplasty Right 03/27/2014    DR Marlou Sa  . Total knee arthroplasty Right 03/27/2014    Procedure: UNICOMPARTMENTAL ARTHROPLASTY;  Surgeon: Meredith Pel, MD;  Location: [place];  Service: Orthopedics;  Laterality: Right;  . Breath tek h pylori N/A 05/15/2014    Procedure: BREATH TEK H PYLORI;  Surgeon: Alphonsa Overall, MD;  Location: [facility];  Service: General;  Laterality: N/A;  . Laparoscopic gastric banding N/A 11/12/2014    Procedure: LAPAROSCOPIC GASTRIC BANDING;  Surgeon: Alphonsa Overall, MD;  Location: [facility];  Service: General;  Laterality: N/A;   No family history on file. History  Substance Use Topics  . Smoking status: Current Some Day Smoker -- 0.20 packs/day for 13 years    Types: Cigarettes  . Smokeless tobacco: Never Used     Comment: 2 cigs a day  . Alcohol Use: No    Review of Systems  Constitutional: Negative for appetite change and fatigue.  HENT: Negative for congestion, ear discharge and sinus pressure.   Eyes: Negative for discharge.  Respiratory: Negative for cough.   Cardiovascular: Negative for chest pain.  Gastrointestinal: Positive for abdominal pain. Negative for diarrhea.  Genitourinary: Negative for frequency and hematuria.  Musculoskeletal: Negative for back pain.  Skin: Negative for rash.  Neurological: Negative for seizures and headaches.  Psychiatric/Behavioral: Negative for hallucinations.      Allergies  Review of patient's allergies indicates no known allergies.  Home Medications   Prior to Admission medications   Medication Sig Start Date End Date Taking? Authorizing Provider  omeprazole (  PRILOSEC) 20 MG capsule Take 20 mg by mouth daily as needed (for heartburn).     Historical Provider, MD  ondansetron (ZOFRAN ODT) 4 MG disintegrating tablet Take 1 tablet (4 mg total) by mouth every 8 (eight) hours as needed for nausea or vomiting. 11/25/14   Waynetta Pean, [name]  oxyCODONE (OXY IR/ROXICODONE) 5 MG immediate release tablet Take 1 tablet (5 mg total) by mouth every 3 (three) hours as needed for moderate pain. 12/01/14   Velvet Bathe, MD  sevelamer carbonate (RENVELA) 2.4 G PACK Take 2.4 g by mouth 3 (three)  times daily with meals.    Historical Provider, MD   BP 135/77 mmHg  Pulse 90  Temp(Src) 97.8 F (36.6 C) (Oral)  Resp 16  SpO2 100% Physical Exam  Constitutional: He is oriented to person, place, and time. He appears well-developed.  HENT:  Head: Normocephalic.  Eyes: Conjunctivae and EOM are normal. No scleral icterus.  Neck: Neck supple. No thyromegaly present.  Cardiovascular: Normal rate and regular rhythm.  Exam reveals no gallop and no friction rub.   No murmur heard. Pulmonary/Chest: No stridor. He has no wheezes. He has no rales. He exhibits no tenderness.  Abdominal: He exhibits no distension. There is tenderness. There is no rebound.  Tender epigastric  Musculoskeletal: Normal range of motion. He exhibits no edema.  Lymphadenopathy:    He has no cervical adenopathy.  Neurological: He is oriented to person, place, and time. He exhibits normal muscle tone. Coordination normal.  Skin: No rash noted. No erythema.  Psychiatric: He has a normal mood and affect. His behavior is normal.    ED Course  Procedures (including critical care time) Labs Review Labs Reviewed  CBC WITH DIFFERENTIAL/PLATELET - Abnormal; Notable for the following:    RBC 3.80 (*)    Hemoglobin 11.8 (*)    HCT 35.2 (*)    Monocytes Relative 14 (*)    All other components within normal limits  COMPREHENSIVE METABOLIC PANEL - Abnormal; Notable for the following:    Chloride 100 (*)    BUN 5 (*)    Creatinine, Ser 5.25 (*)    Calcium 10.7 (*)    AST 42 (*)    GFR calc non Af Amer 13 (*)    GFR calc Af Amer 16 (*)    All other components within normal limits  LIPASE, BLOOD  I-STAT TROPOININ, ED  I-STAT TROPOININ, ED    Imaging Review Dg Chest 2 View  12/05/2014   CLINICAL DATA:  Chest pain for 1 day.  EXAM: CHEST  2 VIEW  COMPARISON:  PA and lateral chest 04/09/2014.  FINDINGS: Heart size and mediastinal contours are within normal limits. Both lungs are clear. Visualized skeletal structures are  unremarkable.  IMPRESSION: Negative chest.   Electronically Signed   By: Inge Rise M.D.   On: 12/05/2014 20:36   Dg Abd 1 View  12/05/2014   CLINICAL DATA:  Lab band surgery 11/12/2014 mid abdominal pain  EXAM: ABDOMEN - 1 VIEW  COMPARISON:  Plain film 11/27/2014  FINDINGS: The gastric banding device is in expected location at the gastric cardia. Band is oriented from 2 o'clock to 8 o'clock. The access port is in the right lower quadrant with continuous tubing to the cuff. No dilated loops large or small bowel. Small amount gas the rectum.  IMPRESSION: Gastric banding device appears in proper orientation. No evidence of bowel obstruction.   Electronically Signed   By: Helane Gunther.D.  On: 12/05/2014 20:35     EKG Interpretation   Date/Time:  Wednesday Dec 05 2014 18:48:55 EDT Ventricular Rate:  79 PR Interval:  173 QRS Duration: 116 QT Interval:  352 QTC Calculation: 403 R Axis:   56 Text Interpretation:  Sinus rhythm Nonspecific intraventricular conduction  delay Confirmed by Finlee Milo  MD, Zechariah Bissonnette [phone]) on 12/05/2014 9:24:14 PM      MDM   Final diagnoses:  Pain  Abdominal pain in male    Dr. Lucia Gaskins to see the pt and admit    Milton Ferguson, MD 12/05/14 2126

## 2014-12-05 NOTE — Progress Notes (Signed)
Attempted to call report. ED nurse states she will call back to give report.   Shelbie Hutching, RN, BSN

## 2014-12-05 NOTE — H&P (Signed)
[name] is an 29 y.o. male.    Chief Complaint: abdominal pain  HPI:  Patient is status post lap band 11/12/2014. He has had recurring problems with epigastric abdominal pain and nausea and vomiting since surgery. He has been evaluated in the emergency room on a couple of times and was admitted to the hospital one week ago with recurrent epigastric pain and vomiting. A second CT scan of the abdomen and pelvis since surgery was obtained at that time that was negative. The patient was discharged 4 days ago and says at that time he was doing okay and tolerating liquids with just occasional mild nausea. Yesterday he began to have worsening nausea. Today he developed increasing nausea on dialysis and then developed recurrent severe epigastric pain and nausea and vomiting which has persisted and he presents to the emergency room. He describes sharp and aching pain in his epigastrium through his back. Inability to tolerate any fluids with nausea and vomiting when he tries to drink. He states the pain is identical to what he had last week.  Past Medical History  Diagnosis Date  . Hyperparathyroidism   . Morbid obesity   . Arthritis   . Complication of anesthesia     A little while to wake up after knee surgery in 2008  . Family history of anesthesia complication     mom slow to wake up  . Headache(784.0)     occasionally  . Dizziness     when coming off of dialysis  . Joint pain   . Joint swelling   . GERD (gastroesophageal reflux disease)     takes Omeprazole as needed  . Hypertension   . Renal disorder     MWF and goes to [facility]  . Renal insufficiency     Past Surgical History  Procedure Laterality Date  . Av fistula placement Bilateral     Uses Right arm  . Esophagogastroduodenoscopy endoscopy    . Knee arthroscopy Left 2007  . Parathyroidectomy N/A 03/30/2013    Procedure: TOTAL PARATHYROIDECTOMY WITH AUTOTRANSPLANT;  Surgeon: Earnstine Regal, MD;  Location: [place];  Service:  General;  Laterality: N/A;  Autotransplant to left lower arm.  . Total knee arthroplasty Right 03/27/2014    DR Marlou Sa  . Total knee arthroplasty Right 03/27/2014    Procedure: UNICOMPARTMENTAL ARTHROPLASTY;  Surgeon: Meredith Pel, MD;  Location: [place];  Service: Orthopedics;  Laterality: Right;  . Breath tek h pylori N/A 05/15/2014    Procedure: BREATH TEK H PYLORI;  Surgeon: Alphonsa Overall, MD;  [facility]: [facility];  Service: General;  Laterality: N/A;  . Laparoscopic gastric banding N/A 11/12/2014    Procedure: LAPAROSCOPIC GASTRIC BANDING;  Surgeon: Alphonsa Overall, MD;  Location: [facility];  Service: General;  Laterality: N/A;    No family history on file. Social History:  reports that he has been smoking Cigarettes.  He has a 2.6 pack-year smoking history. He has never used smokeless tobacco. He reports that he does not drink alcohol or use illicit drugs.  Allergies: No Known Allergies  No current facility-administered medications for this encounter.   Current Outpatient Prescriptions  Medication Sig Dispense Refill  . omeprazole (PRILOSEC) 20 MG capsule Take 20 mg by mouth daily as needed (for heartburn).     . ondansetron (ZOFRAN ODT) 4 MG disintegrating tablet Take 1 tablet (4 mg total) by mouth every 8 (eight) hours as needed for nausea or vomiting. 10 tablet 0  . oxyCODONE (OXY IR/ROXICODONE)  5 MG immediate release tablet Take 1 tablet (5 mg total) by mouth every 3 (three) hours as needed for moderate pain. 30 tablet 0  . sevelamer carbonate (RENVELA) 2.4 G PACK Take 2.4 g by mouth 3 (three) times daily with meals.       Results for orders placed or performed during the hospital encounter of 12/05/14 (from the past 48 hour(s))  CBC with Differential     Status: Abnormal   Collection Time: 12/05/14  7:30 PM  Result Value Ref Range   WBC 6.1 4.0 - 10.5 K/uL   RBC 3.80 (L) 4.22 - 5.81 MIL/uL   Hemoglobin 11.8 (L) 13.0 - 17.0 g/dL   HCT 35.2 (L) 39.0 - 52.0 %   MCV 92.6 78.0 -  100.0 fL   MCH 31.1 26.0 - 34.0 pg   MCHC 33.5 30.0 - 36.0 g/dL   RDW 13.5 11.5 - 15.5 %   Platelets 190 150 - 400 K/uL   Neutrophils Relative % 56 43 - 77 %   Neutro Abs 3.4 1.7 - 7.7 K/uL   Lymphocytes Relative 26 12 - 46 %   Lymphs Abs 1.6 0.7 - 4.0 K/uL   Monocytes Relative 14 (H) 3 - 12 %   Monocytes Absolute 0.9 0.1 - 1.0 K/uL   Eosinophils Relative 3 0 - 5 %   Eosinophils Absolute 0.2 0.0 - 0.7 K/uL   Basophils Relative 1 0 - 1 %   Basophils Absolute 0.0 0.0 - 0.1 K/uL  Comprehensive metabolic panel     Status: Abnormal   Collection Time: 12/05/14  7:30 PM  Result Value Ref Range   Sodium 138 135 - 145 mmol/L   Potassium 3.5 3.5 - 5.1 mmol/L   Chloride 100 (L) 101 - 111 mmol/L   CO2 24 22 - 32 mmol/L   Glucose, Bld 87 70 - 99 mg/dL   BUN 5 (L) 6 - 20 mg/dL   Creatinine, Ser 5.25 (H) 0.61 - 1.24 mg/dL   Calcium 10.7 (H) 8.9 - 10.3 mg/dL   Total Protein 7.5 6.5 - 8.1 g/dL   Albumin 3.7 3.5 - 5.0 g/dL   AST 42 (H) 15 - 41 U/L   ALT 51 17 - 63 U/L   Alkaline Phosphatase 60 38 - 126 U/L   Total Bilirubin 0.9 0.3 - 1.2 mg/dL   GFR calc non Af Amer 13 (L) >60 mL/min   GFR calc Af Amer 16 (L) >60 mL/min    Comment: (NOTE) The eGFR has been calculated using the CKD EPI equation. This calculation has not been validated in all clinical situations. eGFR's persistently <90 mL/min signify possible Chronic Kidney Disease.    Anion gap 14 5 - 15  Lipase, blood     Status: None   Collection Time: 12/05/14  7:30 PM  Result Value Ref Range   Lipase 40 22 - 51 U/L  I-stat troponin, ED (only if pt is 30 y.o. or older & pain is above umbilicus)  not at [facility]     Status: None   Collection Time: 12/05/14  7:38 PM  Result Value Ref Range   Troponin i, poc 0.00 0.00 - 0.08 ng/mL   Comment 3            Comment: Due to the release kinetics of cTnI, a negative result within the first hours of the onset of symptoms does not rule out myocardial infarction with certainty. If  myocardial infarction is still suspected, repeat the  test at appropriate intervals.    Dg Chest 2 View  12/05/2014   CLINICAL DATA:  Chest pain for 1 day.  EXAM: CHEST  2 VIEW  COMPARISON:  PA and lateral chest 04/09/2014.  FINDINGS: Heart size and mediastinal contours are within normal limits. Both lungs are clear. Visualized skeletal structures are unremarkable.  IMPRESSION: Negative chest.   Electronically Signed   By: Inge Rise M.D.   On: 12/05/2014 20:36   Dg Abd 1 View  12/05/2014   CLINICAL DATA:  Lab band surgery 11/12/2014 mid abdominal pain  EXAM: ABDOMEN - 1 VIEW  COMPARISON:  Plain film 11/27/2014  FINDINGS: The gastric banding device is in expected location at the gastric cardia. Band is oriented from 2 o'clock to 8 o'clock. The access port is in the right lower quadrant with continuous tubing to the cuff. No dilated loops large or small bowel. Small amount gas the rectum.  IMPRESSION: Gastric banding device appears in proper orientation. No evidence of bowel obstruction.   Electronically Signed   By: Suzy Bouchard M.D.   On: 12/05/2014 20:35    Review of Systems  Constitutional: Negative for fever and chills.  Respiratory: Negative for shortness of breath.   Cardiovascular: Negative for chest pain and leg swelling.  Gastrointestinal: Positive for nausea, vomiting and abdominal pain.    Blood pressure 135/77, pulse 90, temperature 97.8 F (36.6 C), temperature source Oral, resp. rate 16, SpO2 100 %. Physical Exam  General: Alert, morbidly obese African-American male, appears in pain Skin: Warm and dry without rash or infection. HEENT: No palpable masses or thyromegaly. Healed anterior neck incision.Sclera nonicteric. Lungs: Breath sounds clear and equal without increased work of breathing Cardiovascular: Regular mild tachycardia regular rhythm without murmur. No JVD or edema. Peripheral pulses intact. Abdomen: Nondistended.  Obese. Moderate diffuse tenderness but  particularly tender in the epigastrium.. No masses palpable. No organomegaly. No palpable hernias. Incisions and port site appear unremarkable without infection. Extremities: No edema or joint swelling or deformity. No chronic venous stasis changes. Neurologic: Alert and fully oriented. No gross motor deficits  Assessment/Plan Recurrent abdominal pain/nausea and vomiting post lap band placement 11/12/2014. End-stage renal disease. By history his pain seems identical to what he was having last week. He has had 2 previous normal CT scans. KUB unremarkable today with banded good position although there is a somewhat gas dilated stomach. Lab work is remarkable for signs of infection. This could represent gastroparesis/ileus. I don't see any signs of severe complications from his lap band such as esophageal injury or obstruction based on his extensive previous workup and the timing of his symptoms. Patient will be admitted for symptom management and observation. I'm not sure what further workup would be helpful. He possibly will need removal of his band if his symptoms continue.  Jadyn Barge T 12/05/2014, 10:09 PM

## 2014-12-05 NOTE — ED Notes (Signed)
Pt. Is from home. Went to dialysis today, complaint of unbearable epigastric pain. Denies N/V at this time. Pt. Had lap band placed 11/12/14 at [facility]. Pt. Admitted last Tuesday-Saturday.

## 2014-12-06 LAB — CBC
HCT: 34.1 % — ABNORMAL LOW (ref 39.0–52.0)
HEMOGLOBIN: 11.1 g/dL — AB (ref 13.0–17.0)
MCH: 30.2 pg (ref 26.0–34.0)
MCHC: 32.6 g/dL (ref 30.0–36.0)
MCV: 92.7 fL (ref 78.0–100.0)
PLATELETS: 189 10*3/uL (ref 150–400)
RBC: 3.68 MIL/uL — ABNORMAL LOW (ref 4.22–5.81)
RDW: 13.6 % (ref 11.5–15.5)
WBC: 4.9 10*3/uL (ref 4.0–10.5)

## 2014-12-06 LAB — BASIC METABOLIC PANEL
Anion gap: 17 — ABNORMAL HIGH (ref 5–15)
BUN: 6 mg/dL (ref 6–20)
CALCIUM: 9.7 mg/dL (ref 8.9–10.3)
CO2: 19 mmol/L — ABNORMAL LOW (ref 22–32)
Chloride: 104 mmol/L (ref 101–111)
Creatinine, Ser: 6.79 mg/dL — ABNORMAL HIGH (ref 0.61–1.24)
GFR calc Af Amer: 11 mL/min — ABNORMAL LOW (ref >60)
GFR calc non Af Amer: 10 mL/min — ABNORMAL LOW (ref >60)
Glucose, Bld: 81 mg/dL (ref 70–99)
Potassium: 3.9 mmol/L (ref 3.5–5.1)
Sodium: 140 mmol/L (ref 135–145)

## 2014-12-06 LAB — MRSA PCR SCREENING: MRSA by PCR: NEGATIVE

## 2014-12-06 MED ORDER — SEVELAMER CARBONATE 2.4 G PO PACK
2.4000 g | PACK | Freq: Three times a day (TID) | ORAL | Status: DC
  Filled 2014-12-06 (×4): qty 1

## 2014-12-06 MED ORDER — SEVELAMER CARBONATE 2.4 G PO PACK
2.4000 g | PACK | Freq: Three times a day (TID) | ORAL | Status: DC
  Administered 2014-12-08 (×2): 2.4 g via ORAL
  Filled 2014-12-06 (×8): qty 1

## 2014-12-06 MED ORDER — OXYCODONE HCL 5 MG/5ML PO SOLN: 5.0000 mg | ORAL | Status: DC | PRN

## 2014-12-06 MED ORDER — NEPRO/CARBSTEADY PO LIQD: 237.0000 mL | ORAL | Status: DC | PRN

## 2014-12-06 MED ORDER — LIDOCAINE HCL (PF) 1 % IJ SOLN: 5.0000 mL | INTRAMUSCULAR | Status: DC | PRN

## 2014-12-06 MED ORDER — ALTEPLASE 2 MG IJ SOLR
2.0000 mg | Freq: Once | INTRAMUSCULAR | Status: AC | PRN
  Filled 2014-12-06: qty 2

## 2014-12-06 MED ORDER — HEPARIN SODIUM (PORCINE) 1000 UNIT/ML DIALYSIS
1000.0000 [IU] | INTRAMUSCULAR | Status: DC | PRN
  Filled 2014-12-06: qty 1

## 2014-12-06 MED ORDER — PENTAFLUOROPROP-TETRAFLUOROETH EX AERO: 1.0000 "application " | INHALATION_SPRAY | CUTANEOUS | Status: DC | PRN

## 2014-12-06 MED ORDER — HEPARIN SODIUM (PORCINE) 1000 UNIT/ML DIALYSIS
20.0000 [IU]/kg | INTRAMUSCULAR | Status: DC | PRN
  Filled 2014-12-06: qty 3

## 2014-12-06 MED ORDER — SODIUM CHLORIDE 0.9 % IV SOLN: 100.0000 mL | INTRAVENOUS | Status: DC | PRN

## 2014-12-06 MED ORDER — LIDOCAINE-PRILOCAINE 2.5-2.5 % EX CREA: 1.0000 "application " | TOPICAL_CREAM | CUTANEOUS | Status: DC | PRN

## 2014-12-06 MED ORDER — ONDANSETRON HCL 4 MG/5ML PO SOLN
4.0000 mg | Freq: Three times a day (TID) | ORAL | Status: DC | PRN
  Filled 2014-12-06: qty 5

## 2014-12-06 NOTE — Consult Note (Signed)
[facility] Renal Consultation Note    Indication for Consultation:  Management of ESRD/hemodialysis; anemia, hypertension/volume and secondary hyperparathyroidism  HPI: [name] is a 29 y.o. male with ESRD on MWF HD with morbid obesity s/p lap band 11/12/2014 with recurrent N, V and abdominal pain.  Just d/c over the weekend for a similar admission with negative work up.  Went to HD yesterday with a net UF of 0.8 and post HD weight of 129.5.  He had nausea yesterday at HD and presented to the ED last evening after developing severe epigastric pain with N and V at thome.Marland Kitchen He was unable to tolerate fluids without having N and V and was subsequently admitted by [facility].  He is very tired of N, V and abdominal pain. He did have a BM yesterday.  He has been eating very little.  He said he had a few good days since the lap band was placed but none recently. He denies fever, chills, SOB, CP.  Past Medical History  Diagnosis Date  . Hyperparathyroidism   . Morbid obesity   . Arthritis   . Complication of anesthesia     A little while to wake up after knee surgery in 2008  . Family history of anesthesia complication     mom slow to wake up  . Headache(784.0)     occasionally  . Dizziness     when coming off of dialysis  . Joint pain   . Joint swelling   . GERD (gastroesophageal reflux disease)     takes Omeprazole as needed  . Hypertension   . Renal disorder     MWF and goes to [facility]  . Renal insufficiency    Past Surgical History  Procedure Laterality Date  . Av fistula placement Bilateral     Uses Right arm  . Esophagogastroduodenoscopy endoscopy    . Knee arthroscopy Left 2007  . Parathyroidectomy N/A 03/30/2013    Procedure: TOTAL PARATHYROIDECTOMY WITH AUTOTRANSPLANT;  Surgeon: Earnstine Regal, MD;  Location: [place];  Service: General;  Laterality: N/A;  Autotransplant to left lower arm.  . Total knee arthroplasty Right 03/27/2014    DR Marlou Sa  . Total knee  arthroplasty Right 03/27/2014    Procedure: UNICOMPARTMENTAL ARTHROPLASTY;  Surgeon: Meredith Pel, MD;  Location: [place];  Service: Orthopedics;  Laterality: Right;  . Breath tek h pylori N/A 05/15/2014    Procedure: BREATH TEK H PYLORI;  Surgeon: Alphonsa Overall, MD;  Location: [facility];  Service: General;  Laterality: N/A;  . Laparoscopic gastric banding N/A 11/12/2014    Procedure: LAPAROSCOPIC GASTRIC BANDING;  Surgeon: Alphonsa Overall, MD;  Location: [facility];  Service: General;  Laterality: N/A;   No family history on file. Social History:  reports that he has been smoking Cigarettes.  He has a 2.6 pack-year smoking history. He has never used smokeless tobacco. He reports that he does not drink alcohol or use illicit drugs. No Known Allergies Prior to Admission medications   Medication Sig Start Date End Date Taking? Authorizing Provider  omeprazole (PRILOSEC) 20 MG capsule Take 20 mg by mouth daily as needed (for heartburn).    Yes Historical Provider, MD  ondansetron (ZOFRAN ODT) 4 MG disintegrating tablet Take 1 tablet (4 mg total) by mouth every 8 (eight) hours as needed for nausea or vomiting. 11/25/14  Yes Waynetta Pean, [name]  oxyCODONE (OXY IR/ROXICODONE) 5 MG immediate release tablet Take 1 tablet (5 mg total) by mouth every 3 (three)  hours as needed for moderate pain. 12/01/14  Yes Velvet Bathe, MD  sevelamer carbonate (RENVELA) 2.4 G PACK Take 2.4 g by mouth 3 (three) times daily with meals.   Yes Historical Provider, MD   Current Facility-Administered Medications  Medication Dose Route Frequency Provider Last Rate Last Dose  . 0.9 %  sodium chloride infusion   Intravenous Continuous Excell Seltzer, MD 50 mL/hr at 12/06/14 0017    . HYDROmorphone (DILAUDID) injection 1 mg  1 mg Intravenous Q1H PRN Excell Seltzer, MD   1 mg at 12/06/14 0809  . ondansetron (ZOFRAN) 4 MG/5ML solution 4 mg  4 mg Oral Q8H PRN Alphonsa Overall, MD      . ondansetron [facility]) injection 4 mg  4 mg  Intravenous Q6H PRN Excell Seltzer, MD   4 mg at 12/06/14 0017  . oxyCODONE (ROXICODONE) 5 MG/5ML solution 5 mg  5 mg Oral Q4H PRN Alphonsa Overall, MD      . pantoprazole (PROTONIX) injection 40 mg  40 mg Intravenous QHS Excell Seltzer, MD   40 mg at 12/06/14 0046  . promethazine (PHENERGAN) injection 12.5 mg  12.5 mg Intravenous Q6H PRN Excell Seltzer, MD   12.5 mg at 12/06/14 0353  . sevelamer carbonate (RENVELA) powder PACK 2.4 g  2.4 g Oral TID WC Alphonsa Overall, MD   2.4 g at 12/06/14 0800   Labs: Basic Metabolic Panel:  Recent Labs Lab 12/01/14 0805 12/05/14 1930 12/06/14 0437  NA 137 138 140  K 4.0 3.5 3.9  CL 98 100* 104  CO2 22 24 19*  GLUCOSE 63* 87 81  BUN 13 5* 6  CREATININE 8.46* 5.25* 6.79*  CALCIUM 7.7* 10.7* 9.7   Liver Function Tests:  Recent Labs Lab 11/30/14 0500 12/01/14 0805 12/05/14 1930  AST 13 54* 42*  ALT 11 29 51  ALKPHOS 47 53 60  BILITOT 1.1 1.1 0.9  PROT 6.7 6.3 7.5  ALBUMIN 3.5 3.3* 3.7    Recent Labs Lab 12/05/14 1930 12/05/14 2241  LIPASE 40 40  CBC:  Recent Labs Lab 11/30/14 0500 12/01/14 0805 12/05/14 1930 12/06/14 0437  WBC 7.2 6.1 6.1 4.9  NEUTROABS 4.1 3.4 3.4  --   HGB 11.1* 10.7* 11.8* 11.1*  HCT 33.6* 32.2* 35.2* 34.1*  MCV 93.1 93.1 92.6 92.7  PLT 192 166 190 189  Studies/Results: Dg Chest 2 View  12/05/2014   CLINICAL DATA:  Chest pain for 1 day.  EXAM: CHEST  2 VIEW  COMPARISON:  PA and lateral chest 04/09/2014.  FINDINGS: Heart size and mediastinal contours are within normal limits. Both lungs are clear. Visualized skeletal structures are unremarkable.  IMPRESSION: Negative chest.   Electronically Signed   By: Inge Rise M.D.   On: 12/05/2014 20:36   Dg Abd 1 View  12/05/2014   CLINICAL DATA:  Lab band surgery 11/12/2014 mid abdominal pain  EXAM: ABDOMEN - 1 VIEW  COMPARISON:  Plain film 11/27/2014  FINDINGS: The gastric banding device is in expected location at the gastric cardia. Band is oriented from  2 o'clock to 8 o'clock. The access port is in the right lower quadrant with continuous tubing to the cuff. No dilated loops large or small bowel. Small amount gas the rectum.  IMPRESSION: Gastric banding device appears in proper orientation. No evidence of bowel obstruction.   Electronically Signed   By: Suzy Bouchard M.D.   On: 12/05/2014 20:35    ROS: As per HPI otherwise negative.  Physical Exam: Filed Vitals:  12/05/14 2300 12/05/14 2352 12/06/14 0455 12/06/14 0823  BP: 129/71 135/88 119/59 127/77  Pulse: 96 79 84 85  Temp:  98.2 F (36.8 C) 98 F (36.7 C) 98.2 F (36.8 C)  TempSrc:  Oral Axillary Oral  Resp:  20 18 18   Height:  5\' 5"  (1.651 m)    Weight:  131.997 kg (291 lb)    SpO2:  100% 100% 100%     General:  Morbidly obese young male, uncomfrotable Head: Normocephalic, atraumatic, sclera non-icteric, mucus membranes are moist Neck: Supple. JVD not elevated. Lungs: Clear bilaterally to auscultation without wheezes, rales, or rhonchi. Breathing is unlabored. Heart: RRR with S1 S2. No murmurs, rubs, or gallops appreciated. Abdomen: Obese Soft, tender, non-distended incision sites well healed. Lower extremities: without edema or ischemic changes, no open wounds  Neuro: Alert and oriented X 3. Moves all extremities spontaneously. Psych:  Responds to questions appropriately with a normal affect. Dialysis Access:right forearm AVGG + bruit  Dialysis Orders:   [facility] on MWF .EDW 130 (but left at 129.5 last) HD Bath 2.0k/ 3.5 ca Time 4.5 hrs Heparin 12000. Access RFA AVF BFR 400 DFR 800 PO Calcitriol .50 mcg /HD/ Mircera 50mg  q2wks (last given 5/416) Units IV/HD Venofer 50mg  q weekly hd  Other Op labs= hgb 11.9 ca 8.7 phos 8.2 pth 8.0  Assessment/Plan: 1.  Recurrent N, V, abdominal pain s/p lap band 4/11- [facility] following, noted slight trend up in LFT - follow- repeat with pre HD labs tomorrow; pt transitioning to po liquid pain meds; pt said there is consideration of  removal of lap band if sx do not go away 2.  ESRD -  MWF - titrating down EDW; s/p recent intervention with repeat AF 5/4 1765 much improved; have discussed pt with Dr. Lucia Gaskins.  Pt to have upper endo at 11 am tomorrow with HD to follow and possible d/c Sat pending outcome. 3.  Hypertension/volume  - volume control only 4.  Anemia  - Hgb 11.1 - last received Mircera 50  5/4 5.  Metabolic bone disease - calcitriol for Ca support s/p parathyroidectomy- no binders until eating 6.  Nutrition - NPO -advance per [place], [name] [place] [phone] 12/06/2014, 11:18 AM    Pt seen, examined and agree w A/P as above.  Kelly Splinter MD pager [phone]    cell [phone] 12/06/2014, 1:58 PM

## 2014-12-06 NOTE — Progress Notes (Signed)
General Surgery Note  LOS: 1 day  POD -     Assessment/Plan: 1. Nausea and vomiting and pain  Admitted last PM by Dr. Excell Seltzer  The patient has had 3 CT scans since lap band placement - 11/15/2014, 11/27/2014, 11/29/2014.  These have all been unremarkable for surgical complication.  Continued difficulty hard to explain.    His pre op weight on 10/25/2014 was 298.  He weight 291 on admission.  So his weight loss is not as dramatic as his symptoms would suggest.  Will plan upper endo tomorrow to look at esophagus and stomach.   2. Lap band - Placed 11/12/2014 - D. Rube Sanchez 3. Hiatal hernia - repaired at the same time as the lap bnad 4. ERSD - Dr. Lenna Sciara. Deterding Dialysis - MWF 5. HTN 6. 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 Gerkin 8. Arthritis right knee - Dr. Marlou Sa   Active Problems:   Abdominal pain, epigastric   Subjective:      Similar symptoms as before.  Nausea with vomiting, retrosternal and epigastric pain.  He is doing better this AM.  Of note, he had asked for pain meds from the nurse, but he was sound asleep when I came in the room.  Will allow to have sips of water and ice.  [Mr. Suella Grove was hospitalized in 06/2004, 11/2005, 12/2005,07/2006 11/2006 for abdominal pain of unknown etiology.  He had normal gastric emptying in 2008.  There is a note by Dr. Deatra Ina in 12/2010 for recurrent idiopathic pancreatitis.  He had nausea at that time for unclear reasons.  There are notes from the ED 04/06/2012 being seen for abdominal pain.  There are no recent notes of abdominal pain prior to surgery.]  Objective:   Filed Vitals:   12/06/14 0455  BP: 119/59  Pulse: 84  Temp: 98 F (36.7 C)  Resp: 18     Intake/Output from previous day:  05/04 0701 - 05/05 0700 In: 500 [I.V.:500] Out: -   Intake/Output this shift:      Physical Exam:   General: Obese AA M who is alert and oriented.    HEENT: Normal. Pupils equal. .   Lungs: Clear.   Abdomen: Soft.  BS  normal.   Wound: Clean.  The hematoma around the port of the lap band has resolved.   Lab Results:    Recent Labs  12/05/14 1930 12/06/14 0437  WBC 6.1 4.9  HGB 11.8* 11.1*  HCT 35.2* 34.1*  PLT 190 189    BMET   Recent Labs  12/05/14 1930 12/06/14 0437  NA 138 140  K 3.5 3.9  CL 100* 104  CO2 24 19*  GLUCOSE 87 81  BUN 5* 6  CREATININE 5.25* 6.79*  CALCIUM 10.7* 9.7    PT/INR  No results for input(s): LABPROT, INR in the last 72 hours.  ABG  No results for input(s): PHART, HCO3 in the last 72 hours.  Invalid input(s): PCO2, PO2   Studies/Results:  Dg Chest 2 View  12/05/2014   CLINICAL DATA:  Chest pain for 1 day.  EXAM: CHEST  2 VIEW  COMPARISON:  PA and lateral chest 04/09/2014.  FINDINGS: Heart size and mediastinal contours are within normal limits. Both lungs are clear. Visualized skeletal structures are unremarkable.  IMPRESSION: Negative chest.   Electronically Signed   By: Inge Rise M.D.   On: 12/05/2014 20:36   Dg Abd 1 View  12/05/2014   CLINICAL DATA:  Lab band surgery 11/12/2014  mid abdominal pain  EXAM: ABDOMEN - 1 VIEW  COMPARISON:  Plain film 11/27/2014  FINDINGS: The gastric banding device is in expected location at the gastric cardia. Band is oriented from 2 o'clock to 8 o'clock. The access port is in the right lower quadrant with continuous tubing to the cuff. No dilated loops large or small bowel. Small amount gas the rectum.  IMPRESSION: Gastric banding device appears in proper orientation. No evidence of bowel obstruction.   Electronically Signed   By: Suzy Bouchard M.D.   On: 12/05/2014 20:35     Anti-infectives:   Anti-infectives    None      Alphonsa Overall, MD, FACS Pager: [phone] Surgery Office: [phone] 12/06/2014

## 2014-12-06 NOTE — Progress Notes (Signed)
Admission note:  Arrival Method: Pt arrived by stretcher from ED Mental Orientation: Alert and oriented x 4 Telemetry: N/A Assessment: Completed Skin: Healing surgical incisions to abdomen. Clean, dry and intact IV: Right AC IV. Clean, dry and intact. NS @ 64ml/hr Pain: States pain is 10/10 to abdomen. Medication administered.  Tubes: AV fistula to left forearm  Safety Measures: Non-slip socks placed, bed in lowest position Fall Prevention Safety Plan: Reviewed with patient Admission Screening: Completed 6700 Orientation: Patient has been oriented to the unit, staff and to the room. Pt lying in bed with no needs stated at this time. Call light is within reach. Will continue to monitor.   Shelbie Hutching, RN, BSN

## 2014-12-07 ENCOUNTER — Encounter (HOSPITAL_COMMUNITY)
Admission: EM | Disposition: A | Discharge status: Home / Self Care | Payer: Self-pay | Source: Home / Self Care | Attending: Surgery

## 2014-12-07 ENCOUNTER — Encounter (HOSPITAL_COMMUNITY): Payer: Self-pay

## 2014-12-07 HISTORY — PX: ESOPHAGOGASTRODUODENOSCOPY: SHX5428

## 2014-12-07 LAB — CBC
HCT: 34.8 % — ABNORMAL LOW (ref 39.0–52.0)
Hemoglobin: 11.4 g/dL — ABNORMAL LOW (ref 13.0–17.0)
MCH: 30.3 pg (ref 26.0–34.0)
MCHC: 32.8 g/dL (ref 30.0–36.0)
MCV: 92.6 fL (ref 78.0–100.0)
Platelets: 199 10*3/uL (ref 150–400)
RBC: 3.76 MIL/uL — AB (ref 4.22–5.81)
RDW: 13.8 % (ref 11.5–15.5)
WBC: 7.4 10*3/uL (ref 4.0–10.5)

## 2014-12-07 LAB — COMPREHENSIVE METABOLIC PANEL
ALBUMIN: 3.4 g/dL — AB (ref 3.5–5.0)
ALK PHOS: 53 U/L (ref 38–126)
ALT: 33 U/L (ref 17–63)
ANION GAP: 15 (ref 5–15)
AST: 17 U/L (ref 15–41)
BUN: 14 mg/dL (ref 6–20)
CHLORIDE: 105 mmol/L (ref 101–111)
CO2: 21 mmol/L — AB (ref 22–32)
Calcium: 9.2 mg/dL (ref 8.9–10.3)
Creatinine, Ser: 11.02 mg/dL — ABNORMAL HIGH (ref 0.61–1.24)
GFR calc Af Amer: 6 mL/min — ABNORMAL LOW (ref >60)
GFR calc non Af Amer: 5 mL/min — ABNORMAL LOW (ref >60)
Glucose, Bld: 73 mg/dL (ref 70–99)
POTASSIUM: 4 mmol/L (ref 3.5–5.1)
SODIUM: 141 mmol/L (ref 135–145)
TOTAL PROTEIN: 6.6 g/dL (ref 6.5–8.1)
Total Bilirubin: 0.7 mg/dL (ref 0.3–1.2)

## 2014-12-07 SURGERY — EGD (ESOPHAGOGASTRODUODENOSCOPY)
Anesthesia: Moderate Sedation

## 2014-12-07 MED ORDER — HYDROMORPHONE HCL 1 MG/ML IJ SOLN
INTRAMUSCULAR | Status: AC
  Administered 2014-12-07: 1 mg via INTRAVENOUS
  Filled 2014-12-07: qty 1

## 2014-12-07 MED ORDER — BUTAMBEN-TETRACAINE-BENZOCAINE 2-2-14 % EX AERO
INHALATION_SPRAY | CUTANEOUS | Status: DC | PRN
  Administered 2014-12-07 (×2): 2 via TOPICAL

## 2014-12-07 MED ORDER — FENTANYL CITRATE (PF) 100 MCG/2ML IJ SOLN
INTRAMUSCULAR | Status: AC
  Filled 2014-12-07: qty 2

## 2014-12-07 MED ORDER — MIDAZOLAM HCL 5 MG/ML IJ SOLN
INTRAMUSCULAR | Status: AC
  Filled 2014-12-07: qty 2

## 2014-12-07 MED ORDER — MIDAZOLAM HCL 10 MG/2ML IJ SOLN
INTRAMUSCULAR | Status: DC | PRN
  Administered 2014-12-07 (×2): 2 mg via INTRAVENOUS

## 2014-12-07 MED ORDER — FENTANYL CITRATE (PF) 100 MCG/2ML IJ SOLN
INTRAMUSCULAR | Status: DC | PRN
  Administered 2014-12-07 (×2): 25 ug via INTRAVENOUS

## 2014-12-07 MED ORDER — DIPHENHYDRAMINE HCL 50 MG/ML IJ SOLN
INTRAMUSCULAR | Status: AC
  Filled 2014-12-07: qty 1

## 2014-12-07 MED ORDER — HYDROMORPHONE HCL 1 MG/ML IJ SOLN
INTRAMUSCULAR | Status: AC
  Filled 2014-12-07: qty 1

## 2014-12-07 NOTE — Op Note (Signed)
12/05/2014 - 12/07/2014  11:45 AM  PATIENT:  [name], 29 y.o., male, MRN: [id number]  PREOP DIAGNOSIS:  Recurrent vomiting, history of lap band  POSTOP DIAGNOSIS:   Normal lap band anatomy, hypertrophic mucosa at distal antrum  PROCEDURE:  Esophagogastroduedonoscopy  SURGEON:   Alphonsa Overall, M.D.  ANESTHESIA:   Fentanyl  50 mcg   Versed 4 mg  INDICATIONS FOR PROCEDURE:  [name] is a 29 y.o. (DOB: [birth year])  AA  male whose primary care physician is AVBUERE,EDWIN A, MD and comes for upper endoscopy to evaluate lap band.   [name] had a lap band placed on 11/12/2014 by Dr. Keturah Barre. Elsi Stelzer.  Since the lap band was placed, he has had repeated episodes of abdominal pain and vomiting.  The reason is unclear.  He has had 3 normal CT scans in the last 2 [place].  He comes for upper endoscopy to evaluate the lap band.   The indications and risks of the endoscopy were explained to the patient.  The risks include, but are not limited to, perforation, bleeding, or injury to the bowel.  PROCEDURE:  The patient was monitored with a pulse oximetry, BP cuff, and EKG.  The patient has nasal O2 flowing during the procedure.   The back of the throat was anesthestized with Ceticaine.  A flexible Pentax endoscope was passed down the throat without difficulty.  Findings include:   Esophagus:   Normal   GE junction at:  37 cm   Lap band:  Normal imprint.  The lumen through the lap band is widely patent.  No reason for obstruction or nausea.   Stomach: Normal.  In distal antrum, he has hypertrophic mucosa.  Because he was becoming combative, I could not do a biopsy.   Duodenum:   Widely patent.  The patient was becoming more combative at this point of the procedure.  PLAN:  Start clear liquids and advance lap band diet.  Hopefully home tomorrow.  If he requires another endoscopy, he will be best served with anesthesia to provide the sedation.  Alphonsa Overall, MD, [facility] Surgery Pager:  [phone] Office phone:  [phone]

## 2014-12-07 NOTE — Progress Notes (Signed)
General Surgery Note  LOS: 2 days  POD -  Day of Surgery  Assessment/Plan: 1. Nausea and vomiting and pain  The patient has had 3 CT scans since lap band placement - 11/15/2014, 11/27/2014, 11/29/2014.  These have all been unremarkable for surgical complication.   His pre op weight on 10/25/2014 was 298.  He weight 291 on admission.  So his weight loss is not as dramatic as his symptoms would suggest.  Upper endoscopy today showed the lap band anatomy to be entirely normal.  Photos in chart.  Plan:  Dialysis this afternoon.  Start clear liquids.  Will aim to go home tomorrow.  2. Lap band - Placed 11/12/2014 - D. Zaryan Yakubov  I removed 3 cc from the lap band on 12/06/2014. 3. Hiatal hernia - repaired at the same time as the lap bnad 4. ERSD - Dr. Lenna Sciara. Deterding Dialysis - MWF 5. HTN 6. 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 Gerkin 8. Arthritis right knee - Dr. Marlou Sa   Active Problems:   Abdominal pain, epigastric   Subjective:      Doing fair.  Will start clear liquids this AM.  If tolerated, he can go home tomorrow.   [Mr. Suella Grove was hospitalized in 06/2004, 11/2005, 12/2005,07/2006 11/2006 for abdominal pain of unknown etiology.  He had normal gastric emptying in 2008.  There is a note by Dr. Deatra Ina in 12/2010 for recurrent idiopathic pancreatitis.  He had nausea at that time for unclear reasons.  There are notes from the ED 04/06/2012 being seen for abdominal pain.  There are no recent notes of abdominal pain prior to surgery.]  Objective:   Filed Vitals:   12/07/14 1149  BP: 116/68  Pulse: 83  Temp: 97.8 F (36.6 C)  Resp: 17     Intake/Output from previous day:     Intake/Output this shift:      Physical Exam:   General: Obese AA M who is alert and oriented.    HEENT: Normal. Pupils equal. .   Lungs: Clear.   Abdomen: Exam is benign. Soft.  BS normal.   Wound: Clean.     Lab Results:     Recent Labs  12/05/14 1930 12/06/14 0437  WBC 6.1 4.9   HGB 11.8* 11.1*  HCT 35.2* 34.1*  PLT 190 189    BMET    Recent Labs  12/05/14 1930 12/06/14 0437  NA 138 140  K 3.5 3.9  CL 100* 104  CO2 24 19*  GLUCOSE 87 81  BUN 5* 6  CREATININE 5.25* 6.79*  CALCIUM 10.7* 9.7    PT/INR  No results for input(s): LABPROT, INR in the last 72 hours.  ABG  No results for input(s): PHART, HCO3 in the last 72 hours.  Invalid input(s): PCO2, PO2   Studies/Results:  Dg Chest 2 View  12/05/2014   CLINICAL DATA:  Chest pain for 1 day.  EXAM: CHEST  2 VIEW  COMPARISON:  PA and lateral chest 04/09/2014.  FINDINGS: Heart size and mediastinal contours are within normal limits. Both lungs are clear. Visualized skeletal structures are unremarkable.  IMPRESSION: Negative chest.   Electronically Signed   By: Inge Rise M.D.   On: 12/05/2014 20:36   Dg Abd 1 View  12/05/2014   CLINICAL DATA:  Lab band surgery 11/12/2014 mid abdominal pain  EXAM: ABDOMEN - 1 VIEW  COMPARISON:  Plain film 11/27/2014  FINDINGS: The gastric banding device is in expected location at the gastric cardia.  Band is oriented from 2 o'clock to 8 o'clock. The access port is in the right lower quadrant with continuous tubing to the cuff. No dilated loops large or small bowel. Small amount gas the rectum.  IMPRESSION: Gastric banding device appears in proper orientation. No evidence of bowel obstruction.   Electronically Signed   By: Suzy Bouchard M.D.   On: 12/05/2014 20:35     Anti-infectives:   Anti-infectives    None      Alphonsa Overall, MD, FACS Pager: [phone] Surgery Office: [phone] 12/07/2014

## 2014-12-07 NOTE — Progress Notes (Signed)
Hemodialysis- Pt tolerated well. System clotted with 20 minutes remaining. Able to rinse all blood back. Pain was an issue during treatment. Given meds post HD for 9/10 pain in abdomen (sharp burning). Report given to primary RN.

## 2014-12-07 NOTE — Progress Notes (Signed)
Subjective:   Abd pain and nausea improving but is NPO. Reports MD removed 2-3 mls from lap band. For upper GI today.   Objective Filed Vitals:   12/06/14 0823 12/06/14 1654 12/06/14 2100 12/07/14 0500  BP: 127/77 106/60 107/70 121/64  Pulse: 85 71 62 82  Temp: 98.2 F (36.8 C) 97.8 F (36.6 C) 98.1 F (36.7 C) 98.2 F (36.8 C)  TempSrc: Oral Oral Oral Oral  Resp: 18 18 18 16   Height:      Weight:   132.224 kg (291 lb 8 oz)   SpO2: 100% 100% 99% 99%   Physical Exam General: alert and oriented. No acute distress.  Heart: RRR  Lungs: CTA, unlabored.  Abdomen: obese, soft, mild tenderness. No signs of infection Extremities: No edema Dialysis Access:  L AVG +b/t  Dialysis Orders: [facility] on MWF .EDW 130 (but left at 129.5 last) HD Bath 2.0k/ 3.5 ca Time 4.5 hrs Heparin 12000. Access RFA AVF BFR 400 DFR 800 PO Calcitriol .50 mcg /HD/ Mircera 50mg  q2wks (last given 5/416) Units IV/HD Venofer 50mg  q weekly hd  Other Op labs= hgb 11.9 ca 8.7 phos 8.2 pth 8.0  Assessment/Plan: 1. Recurrent N, V, abdominal pain s/p lap band 4/11- [facility] following,  pt said 2-3 mls removed from lap band this am and there is consideration of removal of lap band if sx do not go away. Has had 3 CT since lap band placed. For upper endo today 2. ESRD - HD pending today MWF [facility] - titrating down EDW; s/p recent intervention with repeat AF 5/4 1765 much improved; have discussed pt with Dr. Lucia Gaskins. Pt to have upper endo at 11 am tomorrow with HD to follow and possible d/c Sat pending outcome. 3. Hypertension/volume - 121/64 volume control only 4. Anemia - Hgb 11.1 - last received Mircera 50 5/4 5. Metabolic bone disease - Ca+ 9.7 calcitriol for Ca support s/p parathyroidectomy- no binders until eating 6. Nutrition - NPO -advance per [facility]  Shelle Iron, NP [place] [phone] 12/07/2014,9:18 AM  LOS: 2 days   Pt seen, examined and agree w A/P as above.  Kelly Splinter MD pager  [phone]    cell [phone] 12/07/2014, 11:20 AM    Additional Objective Labs: Basic Metabolic Panel:  Recent Labs Lab 12/01/14 0805 12/05/14 1930 12/06/14 0437  NA 137 138 140  K 4.0 3.5 3.9  CL 98 100* 104  CO2 22 24 19*  GLUCOSE 63* 87 81  BUN 13 5* 6  CREATININE 8.46* 5.25* 6.79*  CALCIUM 7.7* 10.7* 9.7   Liver Function Tests:  Recent Labs Lab 12/01/14 0805 12/05/14 1930  AST 54* 42*  ALT 29 51  ALKPHOS 53 60  BILITOT 1.1 0.9  PROT 6.3 7.5  ALBUMIN 3.3* 3.7    Recent Labs Lab 12/05/14 1930 12/05/14 2241  LIPASE 40 40   CBC:  Recent Labs Lab 12/01/14 0805 12/05/14 1930 12/06/14 0437  WBC 6.1 6.1 4.9  NEUTROABS 3.4 3.4  --   HGB 10.7* 11.8* 11.1*  HCT 32.2* 35.2* 34.1*  MCV 93.1 92.6 92.7  PLT 166 190 189   Blood Culture    Component Value Date/Time   SDES URINE, CLEAN CATCH 01/08/2010 1844   SPECREQUEST NONE 01/08/2010 1844   CULT  01/08/2010 1844    Multiple bacterial morphotypes present, none predominant. Suggest appropriate recollection if clinically indicated.   REPTSTATUS 01/10/2010 FINAL 01/08/2010 1844    Cardiac Enzymes: No results for input(s): CKTOTAL, CKMB,  CKMBINDEX, TROPONINI in the last 168 hours. CBG: No results for input(s): GLUCAP in the last 168 hours. Iron Studies: No results for input(s): IRON, TIBC, TRANSFERRIN, FERRITIN in the last 72 hours. @lablastinr3 @ Studies/Results: Dg Chest 2 View  12/05/2014   CLINICAL DATA:  Chest pain for 1 day.  EXAM: CHEST  2 VIEW  COMPARISON:  PA and lateral chest 04/09/2014.  FINDINGS: Heart size and mediastinal contours are within normal limits. Both lungs are clear. Visualized skeletal structures are unremarkable.  IMPRESSION: Negative chest.   Electronically Signed   By: Inge Rise M.D.   On: 12/05/2014 20:36   Dg Abd 1 View  12/05/2014   CLINICAL DATA:  Lab band surgery 11/12/2014 mid abdominal pain  EXAM: ABDOMEN - 1 VIEW  COMPARISON:  Plain film 11/27/2014  FINDINGS: The  gastric banding device is in expected location at the gastric cardia. Band is oriented from 2 o'clock to 8 o'clock. The access port is in the right lower quadrant with continuous tubing to the cuff. No dilated loops large or small bowel. Small amount gas the rectum.  IMPRESSION: Gastric banding device appears in proper orientation. No evidence of bowel obstruction.   Electronically Signed   By: Suzy Bouchard M.D.   On: 12/05/2014 20:35   Medications: . sodium chloride 50 mL/hr at 12/06/14 2238   . pantoprazole (PROTONIX) IV  40 mg Intravenous QHS  . sevelamer carbonate  2.4 g Oral TID WC

## 2014-12-08 MED ORDER — OXYCODONE HCL 5 MG/5ML PO SOLN: 5.0000 mg | ORAL | Status: DC | PRN

## 2014-12-08 NOTE — Discharge Instructions (Signed)
Clear Liquid Diet A clear liquid diet is a short-term diet that is prescribed to provide the necessary fluid and basic energy you need when you can have nothing else. The clear liquid diet consists of liquids or solids that will become liquid at room temperature. You should be able to see through the liquid. There are many reasons that you may be restricted to clear liquids, such as:  When you have a sudden-onset (acute) condition that occurs before or after surgery.  To help your body slowly get adjusted to food again after a long period when you were unable to have food.  Replacement of fluids when you have a diarrheal disease.  When you are going to have certain exams, such as a colonoscopy, in which instruments are inserted inside your body to look at parts of your digestive system. WHAT CAN I HAVE? A clear liquid diet does not provide all the nutrients you need. It is important to choose a variety of the following items to get as many nutrients as possible:  Vegetable juices that do not have pulp.  Fruit juices and fruit drinks that do not have pulp.  Coffee (regular or decaffeinated), tea, or soda at the discretion of your health care provider.  Clear bouillon, broth, or strained broth-based soups.  High-protein and flavored gelatins.  Sugar or honey.  Ices or frozen ice pops that do not contain milk. If you are not sure whether you can have certain items, you should ask your health care provider. You may also ask your health care provider if there are any other clear liquid options. Document Released: 07/20/2005 Document Revised: 07/25/2013 Document Reviewed: 06/16/2013 [facility] Patient Information 2015 [place], [place]. This information is not intended to replace advice given to you by your health care provider. Make sure you discuss any questions you have with your health care provider.

## 2014-12-08 NOTE — Progress Notes (Signed)
Patient ID: [name], male   DOB: 1986/07/27, 29 y.o.   MRN: [id number] 1 Day Post-Op  Subjective: Pt feels ok.  Taking some clear liquids and keeping them down.  Mild tenderness  Objective: Vital signs in last 24 hours: Temp:  [97.8 F (36.6 C)-99 F (37.2 C)] 99 F (37.2 C) (05/07 0845) Pulse Rate:  [68-100] 84 (05/07 0845) Resp:  [14-27] 18 (05/07 0845) BP: (96-137)/(43-99) 113/64 mmHg (05/07 0845) SpO2:  [11 %-100 %] 100 % (05/07 0845) Weight:  [129.1 kg (284 lb 9.8 oz)-130.1 kg (286 lb 13.1 oz)] 129.1 kg (284 lb 9.8 oz) (05/06 1756) Last BM Date: 12/05/14  Intake/Output from previous day: 05/06 0701 - 05/07 0700 In: 10 [I.V.:10] Out: 1042  Intake/Output this shift: Total I/O In: 270 [P.O.:270] Out: 0   PE: Abd: soft, minimally tender, obese, +BS, incisions well healed  Lab Results:   Recent Labs  12/06/14 0437 12/07/14 1400  WBC 4.9 7.4  HGB 11.1* 11.4*  HCT 34.1* 34.8*  PLT 189 199   BMET  Recent Labs  12/06/14 0437 12/07/14 1400  NA 140 141  K 3.9 4.0  CL 104 105  CO2 19* 21*  GLUCOSE 81 73  BUN 6 14  CREATININE 6.79* 11.02*  CALCIUM 9.7 9.2   PT/INR No results for input(s): LABPROT, INR in the last 72 hours. CMP     Component Value Date/Time   NA 141 12/07/2014 1400   K 4.0 12/07/2014 1400   CL 105 12/07/2014 1400   CO2 21* 12/07/2014 1400   GLUCOSE 73 12/07/2014 1400   BUN 14 12/07/2014 1400   CREATININE 11.02* 12/07/2014 1400   CALCIUM 9.2 12/07/2014 1400   PROT 6.6 12/07/2014 1400   ALBUMIN 3.4* 12/07/2014 1400   AST 17 12/07/2014 1400   ALT 33 12/07/2014 1400   ALKPHOS 53 12/07/2014 1400   BILITOT 0.7 12/07/2014 1400   GFRNONAA 5* 12/07/2014 1400   GFRAA 6* 12/07/2014 1400   Lipase     Component Value Date/Time   LIPASE 40 12/05/2014 2241       Studies/Results: No results found.  Anti-infectives: Anti-infectives    None       Assessment/Plan   1. Nausea and vomiting and pain The patient has  had 3 CT scans since lap band placement - 11/15/2014, 11/27/2014, 11/29/2014. These have all been unremarkable for surgical complication.  His pre op weight on 10/25/2014 was 298. He weight 291 on admission. So his weight loss is not as dramatic as his symptoms would suggest. Upper endoscopy today showed the lap band anatomy to be entirely normal. Photos in chart. Plan: patient tolerating clear liquids.  Will let him go home today with follow up with Dr. Lucia Gaskins in the office  2. Lap band - Placed 11/12/2014 - D. Newman I removed 3 cc from the lap band on 12/06/2014. 3. Hiatal hernia - repaired at the same time as the lap bnad 4. ERSD - Dr. Lenna Sciara. [facility] - MWF 5. HTN 6. 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 Gerkin 8. Arthritis right knee - Dr. Marlou Sa  LOS: 3 days    Evangelos Paulino E 12/08/2014, 11:22 AM Pager: [phone]

## 2014-12-08 NOTE — Progress Notes (Signed)
Subjective:   Had a lot of abd pain yesterday but slightly better this AM, still not able to tolerate po  Objective Filed Vitals:   12/07/14 1700 12/07/14 1756 12/07/14 2032 12/08/14 0453  BP: 127/77 124/76 123/65 110/56  Pulse: 78 79 83 78  Temp:   98.7 F (37.1 C) 98.7 F (37.1 C)  TempSrc:   Oral Oral  Resp:  20 20 18   Height:      Weight:  129.1 kg (284 lb 9.8 oz)    SpO2:  99% 100% 100%   Physical Exam General: alert, no distress Heart: RRR no mrg Lungs: clear bilat Abdomen: soft ntnd obese Extremities: no leg or UE edema Dialysis Access: right arm avf patent  Dialysis Orders: [facility] on MWF .EDW 130 (but left at 129.5 last) HD Bath 2.0k/ 3.5 ca Time 4.5 hrs Heparin 12000. Access RFA AVF BFR 400 DFR 800 PO Calcitriol .50 mcg /HD/ Mircera 50mg  q2wks (last given 5/416) Units IV/HD Venofer 50mg  q weekly hd  Other Op labs= hgb 11.9 ca 8.7 phos 8.2 pth 8.0  Assessment/Plan: 1. Recurrent N, V, abdominal pain s/p lap band 4/11- [facility] following,35mls removed from lap band 5/6 and there is consideration of removal of lap band if sx do not go away. Has had 3 CT since lap band placed. Upper endoscopy yesterday- normal lap band anatomy- no reason for obstruction or nausea. 2. ESRD - HD pending today MWF [facility] - titrating down EDW; s/p recent intervention with repeat AF 5/4 1765 much improved; have discussed pt with Dr. Lucia Gaskins. 3. Hypertension/volume - 110/56 volume control only 4. Anemia - Hgb 11.4 - last received Mircera 50 5/4 5. Metabolic bone disease - Ca+ 9.2 calcitriol for Ca support s/p parathyroidectomy- no binders until eating 6. Nutrition - clears -advance per [facility]   Shelle Iron, NP [place] [phone] 12/08/2014,9:36 AM  LOS: 3 days   Pt seen, examined and agree w A/P as above. EGD negative for any pathology, abd pain somewhat improved. For dc today.   Kelly Splinter MD pager [phone]    cell [phone] 12/08/2014, 12:28  PM    Additional Objective Labs: Basic Metabolic Panel:  Recent Labs Lab 12/05/14 1930 12/06/14 0437 12/07/14 1400  NA 138 140 141  K 3.5 3.9 4.0  CL 100* 104 105  CO2 24 19* 21*  GLUCOSE 87 81 73  BUN 5* 6 14  CREATININE 5.25* 6.79* 11.02*  CALCIUM 10.7* 9.7 9.2   Liver Function Tests:  Recent Labs Lab 12/05/14 1930 12/07/14 1400  AST 42* 17  ALT 51 33  ALKPHOS 60 53  BILITOT 0.9 0.7  PROT 7.5 6.6  ALBUMIN 3.7 3.4*    Recent Labs Lab 12/05/14 1930 12/05/14 2241  LIPASE 40 40   CBC:  Recent Labs Lab 12/05/14 1930 12/06/14 0437 12/07/14 1400  WBC 6.1 4.9 7.4  NEUTROABS 3.4  --   --   HGB 11.8* 11.1* 11.4*  HCT 35.2* 34.1* 34.8*  MCV 92.6 92.7 92.6  PLT 190 189 199   Blood Culture    Component Value Date/Time   SDES URINE, CLEAN CATCH 01/08/2010 1844   SPECREQUEST NONE 01/08/2010 1844   CULT  01/08/2010 1844    Multiple bacterial morphotypes present, none predominant. Suggest appropriate recollection if clinically indicated.   REPTSTATUS 01/10/2010 FINAL 01/08/2010 1844    Cardiac Enzymes: No results for input(s): CKTOTAL, CKMB, CKMBINDEX, TROPONINI in the last 168 hours. CBG: No results for input(s): GLUCAP in the  last 168 hours. Iron Studies: No results for input(s): IRON, TIBC, TRANSFERRIN, FERRITIN in the last 72 hours. @lablastinr3 @ Studies/Results: No results found. Medications:   . pantoprazole (PROTONIX) IV  40 mg Intravenous QHS  . sevelamer carbonate  2.4 g Oral TID WC

## 2014-12-10 ENCOUNTER — Emergency Department (HOSPITAL_COMMUNITY)
Admission: EM | Admit: 2014-12-10 | Discharge: 2014-12-10 | Disposition: A | Discharge status: Home / Self Care | Payer: Medicare Other | Attending: Emergency Medicine | Admitting: Emergency Medicine

## 2014-12-10 ENCOUNTER — Emergency Department (HOSPITAL_COMMUNITY): Payer: Medicare Other

## 2014-12-10 ENCOUNTER — Encounter (HOSPITAL_COMMUNITY): Payer: Self-pay | Admitting: *Deleted

## 2014-12-10 DIAGNOSIS — Z8739 Personal history of other diseases of the musculoskeletal system and connective tissue: Secondary | ICD-10-CM | POA: Insufficient documentation

## 2014-12-10 DIAGNOSIS — Z79899 Other long term (current) drug therapy: Secondary | ICD-10-CM | POA: Diagnosis not present

## 2014-12-10 DIAGNOSIS — G8918 Other acute postprocedural pain: Secondary | ICD-10-CM | POA: Diagnosis not present

## 2014-12-10 DIAGNOSIS — Z720 Tobacco use: Secondary | ICD-10-CM | POA: Insufficient documentation

## 2014-12-10 DIAGNOSIS — R1013 Epigastric pain: Secondary | ICD-10-CM | POA: Diagnosis not present

## 2014-12-10 DIAGNOSIS — R109 Unspecified abdominal pain: Secondary | ICD-10-CM | POA: Diagnosis present

## 2014-12-10 DIAGNOSIS — I120 Hypertensive chronic kidney disease with stage 5 chronic kidney disease or end stage renal disease: Secondary | ICD-10-CM | POA: Diagnosis not present

## 2014-12-10 DIAGNOSIS — R1011 Right upper quadrant pain: Secondary | ICD-10-CM | POA: Diagnosis not present

## 2014-12-10 DIAGNOSIS — K5900 Constipation, unspecified: Secondary | ICD-10-CM | POA: Diagnosis not present

## 2014-12-10 DIAGNOSIS — K219 Gastro-esophageal reflux disease without esophagitis: Secondary | ICD-10-CM | POA: Diagnosis not present

## 2014-12-10 DIAGNOSIS — Z992 Dependence on renal dialysis: Secondary | ICD-10-CM | POA: Diagnosis not present

## 2014-12-10 DIAGNOSIS — Z9884 Bariatric surgery status: Secondary | ICD-10-CM | POA: Insufficient documentation

## 2014-12-10 DIAGNOSIS — R1012 Left upper quadrant pain: Secondary | ICD-10-CM | POA: Insufficient documentation

## 2014-12-10 DIAGNOSIS — N186 End stage renal disease: Secondary | ICD-10-CM | POA: Diagnosis not present

## 2014-12-10 LAB — CBC WITH DIFFERENTIAL/PLATELET
BASOS ABS: 0 10*3/uL (ref 0.0–0.1)
BASOS PCT: 0 % (ref 0–1)
Eosinophils Absolute: 0.2 10*3/uL (ref 0.0–0.7)
Eosinophils Relative: 2 % (ref 0–5)
HEMATOCRIT: 37.6 % — AB (ref 39.0–52.0)
Hemoglobin: 12.5 g/dL — ABNORMAL LOW (ref 13.0–17.0)
Lymphocytes Relative: 19 % (ref 12–46)
Lymphs Abs: 1.9 10*3/uL (ref 0.7–4.0)
MCH: 30.9 pg (ref 26.0–34.0)
MCHC: 33.2 g/dL (ref 30.0–36.0)
MCV: 92.8 fL (ref 78.0–100.0)
MONO ABS: 1.1 10*3/uL — AB (ref 0.1–1.0)
Monocytes Relative: 11 % (ref 3–12)
NEUTROS ABS: 6.8 10*3/uL (ref 1.7–7.7)
NEUTROS PCT: 68 % (ref 43–77)
Platelets: 229 10*3/uL (ref 150–400)
RBC: 4.05 MIL/uL — ABNORMAL LOW (ref 4.22–5.81)
RDW: 14 % (ref 11.5–15.5)
WBC: 10.1 10*3/uL (ref 4.0–10.5)

## 2014-12-10 LAB — COMPREHENSIVE METABOLIC PANEL
ALBUMIN: 3.8 g/dL (ref 3.5–5.0)
ALK PHOS: 54 U/L (ref 38–126)
ALT: 19 U/L (ref 17–63)
ANION GAP: 17 — AB (ref 5–15)
AST: 11 U/L — AB (ref 15–41)
BILIRUBIN TOTAL: 0.9 mg/dL (ref 0.3–1.2)
BUN: 16 mg/dL (ref 6–20)
CHLORIDE: 97 mmol/L — AB (ref 101–111)
CO2: 22 mmol/L (ref 22–32)
Calcium: 8.4 mg/dL — ABNORMAL LOW (ref 8.9–10.3)
Creatinine, Ser: 13.3 mg/dL — ABNORMAL HIGH (ref 0.61–1.24)
GFR calc Af Amer: 5 mL/min — ABNORMAL LOW (ref >60)
GFR calc non Af Amer: 4 mL/min — ABNORMAL LOW (ref >60)
Glucose, Bld: 85 mg/dL (ref 70–99)
POTASSIUM: 3.8 mmol/L (ref 3.5–5.1)
Sodium: 136 mmol/L (ref 135–145)
TOTAL PROTEIN: 7.1 g/dL (ref 6.5–8.1)

## 2014-12-10 LAB — LIPASE, BLOOD: Lipase: 51 U/L (ref 22–51)

## 2014-12-10 MED ORDER — ONDANSETRON HCL 4 MG/2ML IJ SOLN
4.0000 mg | Freq: Once | INTRAMUSCULAR | Status: AC
  Administered 2014-12-10: 4 mg via INTRAVENOUS
  Filled 2014-12-10: qty 2

## 2014-12-10 MED ORDER — GI COCKTAIL ~~LOC~~
30.0000 mL | Freq: Once | ORAL | Status: AC
  Administered 2014-12-10: 30 mL via ORAL
  Filled 2014-12-10: qty 30

## 2014-12-10 MED ORDER — IOHEXOL 300 MG/ML  SOLN
100.0000 mL | Freq: Once | INTRAMUSCULAR | Status: AC | PRN
  Administered 2014-12-10: 100 mL via INTRAVENOUS

## 2014-12-10 MED ORDER — HYDROMORPHONE HCL 1 MG/ML IJ SOLN
1.0000 mg | Freq: Once | INTRAMUSCULAR | Status: AC
  Administered 2014-12-10: 1 mg via INTRAVENOUS
  Filled 2014-12-10: qty 1

## 2014-12-10 MED ORDER — IOHEXOL 300 MG/ML  SOLN: 25.0000 mL | INTRAMUSCULAR | Status: AC

## 2014-12-10 NOTE — ED Notes (Signed)
The pt is c/o abd pain  All day.  He had a lap band oplaced one month ago and he was in the hospo for the opastn  2 days.  He was discharged yesterday.  He has had  This pain since the lap band was placed

## 2014-12-10 NOTE — ED Provider Notes (Signed)
CSN: [id number]     Arrival date & time 12/10/14  0023 History   First MD Initiated Contact with Patient 12/10/14 0049     This chart was scribed for No att. providers found by Forrestine Him, ED Scribe. This patient was seen in room D30C/D30C and the patient's care was started 1:02 AM.   Chief Complaint  Patient presents with  . Abdominal Pain   The history is provided by the patient. No language interpreter was used.    HPI Comments: [name] is a 29 y.o. male with a PMHx of hyperparathyroidism, GERD, renal insufficiency, and HTN who presents to the Emergency Department complaining of constant, ongoing, unchanged abdominal pain x 1 day. He also reports ongoing constipation. Pt recently underwent abdominal surgery- lap band placement performed 4/11 and was then kept for observation 2 days after surgery. He denies any issues with surgery. Mr. Blanken denies any fever, chills, nausea, vomiting, or diarrhea. No known allergies to medications.  Past Medical History  Diagnosis Date  . Hyperparathyroidism   . Morbid obesity   . Arthritis   . Complication of anesthesia     A little while to wake up after knee surgery in 2008  . Family history of anesthesia complication     mom slow to wake up  . Headache(784.0)     occasionally  . Dizziness     when coming off of dialysis  . Joint pain   . Joint swelling   . GERD (gastroesophageal reflux disease)     takes Omeprazole as needed  . Hypertension   . Renal disorder     MWF and goes to [facility]  . Renal insufficiency    Past Surgical History  Procedure Laterality Date  . Av fistula placement Bilateral     Uses Right arm  . Esophagogastroduodenoscopy endoscopy    . Knee arthroscopy Left 2007  . Parathyroidectomy N/A 03/30/2013    Procedure: TOTAL PARATHYROIDECTOMY WITH AUTOTRANSPLANT;  Surgeon: Earnstine Regal, MD;  Location: [facility];  Service: General;  Laterality: N/A;  Autotransplant to left lower arm.  . Total knee arthroplasty  Right 03/27/2014    DR Marlou Sa  . Total knee arthroplasty Right 03/27/2014    Procedure: UNICOMPARTMENTAL ARTHROPLASTY;  Surgeon: Meredith Pel, MD;  Location: [place];  Service: Orthopedics;  Laterality: Right;  . Breath tek h pylori N/A 05/15/2014    Procedure: BREATH TEK H PYLORI;  Surgeon: Alphonsa Overall, MD;  Location: [facility];  Service: General;  Laterality: N/A;  . Laparoscopic gastric banding N/A 11/12/2014    Procedure: LAPAROSCOPIC GASTRIC BANDING;  Surgeon: Alphonsa Overall, MD;  Location: [facility];  Service: General;  Laterality: N/A;   No family history on file. History  Substance Use Topics  . Smoking status: Current Some Day Smoker -- 0.20 packs/day for 13 years    Types: Cigarettes  . Smokeless tobacco: Never Used     Comment: 2 cigs a day  . Alcohol Use: No    Review of Systems  Constitutional: Negative for fever and chills.  Respiratory: Negative for shortness of breath.   Cardiovascular: Negative for chest pain.  Gastrointestinal: Positive for abdominal pain and constipation. Negative for nausea, vomiting and diarrhea.  Skin: Negative for rash.  Psychiatric/Behavioral: Negative for confusion.  All other systems reviewed and are negative.     Allergies  Review of patient's allergies indicates no known allergies.  Home Medications   Prior to Admission medications   Medication Sig Start Date  End Date Taking? Authorizing Provider  omeprazole (PRILOSEC) 20 MG capsule Take 20 mg by mouth daily as needed (for heartburn).    Yes Historical Provider, MD  ondansetron (ZOFRAN ODT) 4 MG disintegrating tablet Take 1 tablet (4 mg total) by mouth every 8 (eight) hours as needed for nausea or vomiting. 11/25/14  Yes Waynetta Pean, [name]  oxyCODONE (OXY IR/ROXICODONE) 5 MG immediate release tablet Take 1 tablet (5 mg total) by mouth every 3 (three) hours as needed for moderate pain. 12/01/14  Yes Velvet Bathe, MD  oxyCODONE (ROXICODONE) 5 MG/5ML solution Take 5 mLs (5 mg total) by  mouth every 4 (four) hours as needed for moderate pain or severe pain. 12/08/14  Yes Saverio Danker, [name]  sevelamer carbonate (RENVELA) 2.4 G PACK Take 2.4 g by mouth 3 (three) times daily with meals.   Yes Historical Provider, MD   Triage Vitals: BP 102/55 mmHg  Pulse 85  Temp(Src) 98.3 F (36.8 C) (Oral)  Resp 18  Ht 5\' 5"  (1.651 m)  Wt 286 lb (129.729 kg)  BMI 47.59 kg/m2  SpO2 98%   Physical Exam  Constitutional: He is oriented to person, place, and time. He appears well-developed and well-nourished.  HENT:  Head: Normocephalic and atraumatic.  Eyes: Conjunctivae and EOM are normal.  Neck: Normal range of motion. Neck supple.  Cardiovascular: Normal rate, regular rhythm and normal heart sounds.   Pulmonary/Chest: Effort normal and breath sounds normal. No respiratory distress.  Abdominal: He exhibits no distension. There is tenderness in the right upper quadrant, epigastric area and left upper quadrant. There is no rebound and no guarding.  Musculoskeletal: Normal range of motion.  Neurological: He is alert and oriented to person, place, and time.  Skin: Skin is warm and dry.  Vitals reviewed.   ED Course  Procedures (including critical care time)  DIAGNOSTIC STUDIES: Oxygen Saturation is 99% on RA, Normal by my interpretation.    COORDINATION OF CARE: 1:07 AM- Will order CBC, CMP, Lipase, EKG, and CT abdomen pelvis with contrast. Discussed treatment plan with pt at bedside and pt agreed to plan.     Labs Review Labs Reviewed  CBC WITH DIFFERENTIAL/PLATELET - Abnormal; Notable for the following:    RBC 4.05 (*)    Hemoglobin 12.5 (*)    HCT 37.6 (*)    Monocytes Absolute 1.1 (*)    All other components within normal limits  COMPREHENSIVE METABOLIC PANEL - Abnormal; Notable for the following:    Chloride 97 (*)    Creatinine, Ser 13.30 (*)    Calcium 8.4 (*)    AST 11 (*)    GFR calc non Af Amer 4 (*)    GFR calc Af Amer 5 (*)    Anion gap 17 (*)    All other  components within normal limits  LIPASE, BLOOD    Imaging Review Ct Abdomen Pelvis W Contrast  12/10/2014   CLINICAL DATA:  Mid abdominal pain. Dialysis for 12 years. Recent gastric banding on 11/12/2014.  EXAM: CT ABDOMEN AND PELVIS WITH CONTRAST  TECHNIQUE: Multidetector CT imaging of the abdomen and pelvis was performed using the standard protocol following bolus administration of intravenous contrast.  CONTRAST:  158mL OMNIPAQUE IOHEXOL 300 MG/ML  SOLN  COMPARISON:  11/29/2014  FINDINGS: Mild dependent atelectasis in the lung bases.  The liver, spleen, gallbladder, pancreas, adrenal glands, abdominal aorta, inferior vena cava, and retroperitoneal lymph nodes are unremarkable. Multiple cysts demonstrated throughout the kidneys suggesting polycystic renal disease. No hydronephrosis  in either kidney. Gastric band is present with good location demonstrated. No evidence of proximal dilatation. Stomach, small bowel, and colon are not abnormally dilated. No free air or free fluid in the abdomen.  Pelvis: Prostate gland is not enlarged. Bladder is decompressed. No free or loculated pelvic fluid collections. No pelvic mass or lymphadenopathy. The appendix is normal. No destructive bone lesions.  IMPRESSION: No acute process demonstrated in the abdomen or pelvis to explain symptoms. Appendix is normal. Polycystic renal disease. Gastric band appears in appropriate position.   Electronically Signed   By: Lucienne Capers M.D.   On: 12/10/2014 02:37     EKG Interpretation   Date/Time:  Monday Dec 10 2014 00:27:26 EDT Ventricular Rate:  84 PR Interval:  148 QRS Duration: 104 QT Interval:  354 QTC Calculation: 418 R Axis:   47 Text Interpretation:  Normal sinus rhythm Normal ECG No significant change  since last tracing Confirmed by Debby Freiberg [phone]) on 12/10/2014  1:03:54 AM      MDM   Final diagnoses:  Abdominal pain, acute    29 y.o. male with pertinent PMH of morbid obesity, ESRD (MWF),  recurrent abd pain after lap band with 3 ct scans and multiple admissions for intractable nausea/vomiting presents with recurrent abd pain.  Symptoms identical to prior visits, no fever.  Pt states he has been unable to take PO due to pain.  Physical exam today as above.    Wu as above unremarkable for acute pathology, including CT scan.  Likely chronic etiology related to either gastroparesis, gastric irritation.  Pain relieved with dilaudid and pt took po uneventfully, requested to leave.  DC home in stable condition    I have reviewed all laboratory and imaging studies if ordered as above  1. Abdominal pain, acute           Debby Freiberg, MD 12/10/14 [phone]

## 2014-12-10 NOTE — ED Notes (Signed)
Water provided as po challenge

## 2014-12-10 NOTE — Discharge Instructions (Signed)

## 2014-12-10 NOTE — ED Notes (Signed)
Dr. Colin Rhein at the bedside.

## 2014-12-10 NOTE — ED Notes (Signed)
Informed Dr. Colin Rhein in regards to patient unable to consume contrast, MD allows patient to complete study with IV de only.

## 2014-12-10 NOTE — ED Notes (Signed)
Reported nausea improvement and able to tolerate sips of water to Dr. Colin Rhein. Preparing for discharge per MD.

## 2014-12-10 NOTE — ED Notes (Signed)
Patient states he is unable to consume contrast. None consumed from the cup.

## 2014-12-10 NOTE — ED Notes (Signed)
Pt requesting pain and nausea medication, Dr. Colin Rhein notified.

## 2014-12-10 NOTE — ED Notes (Signed)
Patient states his nausea has improved, stating he has kept his water down, sip by sip.

## 2014-12-12 ENCOUNTER — Emergency Department (HOSPITAL_COMMUNITY)
Admission: EM | Admit: 2014-12-12 | Discharge: 2014-12-12 | Disposition: A | Discharge status: Home / Self Care | Payer: Medicare Other | Attending: Emergency Medicine | Admitting: Emergency Medicine

## 2014-12-12 ENCOUNTER — Encounter (HOSPITAL_COMMUNITY): Payer: Self-pay | Admitting: *Deleted

## 2014-12-12 DIAGNOSIS — Z87448 Personal history of other diseases of urinary system: Secondary | ICD-10-CM | POA: Insufficient documentation

## 2014-12-12 DIAGNOSIS — Z992 Dependence on renal dialysis: Secondary | ICD-10-CM | POA: Insufficient documentation

## 2014-12-12 DIAGNOSIS — I120 Hypertensive chronic kidney disease with stage 5 chronic kidney disease or end stage renal disease: Secondary | ICD-10-CM | POA: Diagnosis not present

## 2014-12-12 DIAGNOSIS — Z9889 Other specified postprocedural states: Secondary | ICD-10-CM | POA: Insufficient documentation

## 2014-12-12 DIAGNOSIS — K219 Gastro-esophageal reflux disease without esophagitis: Secondary | ICD-10-CM | POA: Diagnosis not present

## 2014-12-12 DIAGNOSIS — Z79899 Other long term (current) drug therapy: Secondary | ICD-10-CM | POA: Diagnosis not present

## 2014-12-12 DIAGNOSIS — R1013 Epigastric pain: Secondary | ICD-10-CM | POA: Diagnosis not present

## 2014-12-12 DIAGNOSIS — M1990 Unspecified osteoarthritis, unspecified site: Secondary | ICD-10-CM | POA: Insufficient documentation

## 2014-12-12 DIAGNOSIS — Z720 Tobacco use: Secondary | ICD-10-CM | POA: Diagnosis not present

## 2014-12-12 DIAGNOSIS — R110 Nausea: Secondary | ICD-10-CM | POA: Insufficient documentation

## 2014-12-12 DIAGNOSIS — N186 End stage renal disease: Secondary | ICD-10-CM | POA: Diagnosis not present

## 2014-12-12 LAB — CBC WITH DIFFERENTIAL/PLATELET
BASOS ABS: 0.1 10*3/uL (ref 0.0–0.1)
BASOS PCT: 1 % (ref 0–1)
EOS PCT: 3 % (ref 0–5)
Eosinophils Absolute: 0.2 10*3/uL (ref 0.0–0.7)
HEMATOCRIT: 35.6 % — AB (ref 39.0–52.0)
Hemoglobin: 11.9 g/dL — ABNORMAL LOW (ref 13.0–17.0)
Lymphocytes Relative: 21 % (ref 12–46)
Lymphs Abs: 1.7 10*3/uL (ref 0.7–4.0)
MCH: 30.3 pg (ref 26.0–34.0)
MCHC: 33.4 g/dL (ref 30.0–36.0)
MCV: 90.6 fL (ref 78.0–100.0)
Monocytes Absolute: 0.9 10*3/uL (ref 0.1–1.0)
Monocytes Relative: 11 % (ref 3–12)
Neutro Abs: 4.9 10*3/uL (ref 1.7–7.7)
Neutrophils Relative %: 64 % (ref 43–77)
Platelets: 212 10*3/uL (ref 150–400)
RBC: 3.93 MIL/uL — ABNORMAL LOW (ref 4.22–5.81)
RDW: 14.2 % (ref 11.5–15.5)
WBC: 7.7 10*3/uL (ref 4.0–10.5)

## 2014-12-12 LAB — COMPREHENSIVE METABOLIC PANEL
ALK PHOS: 48 U/L (ref 38–126)
ALT: 13 U/L — ABNORMAL LOW (ref 17–63)
AST: 10 U/L — ABNORMAL LOW (ref 15–41)
Albumin: 3.5 g/dL (ref 3.5–5.0)
Anion gap: 17 — ABNORMAL HIGH (ref 5–15)
BUN: 9 mg/dL (ref 6–20)
CO2: 22 mmol/L (ref 22–32)
CREATININE: 9.19 mg/dL — AB (ref 0.61–1.24)
Calcium: 9.5 mg/dL (ref 8.9–10.3)
Chloride: 98 mmol/L — ABNORMAL LOW (ref 101–111)
GFR calc non Af Amer: 7 mL/min — ABNORMAL LOW (ref >60)
GFR, EST AFRICAN AMERICAN: 8 mL/min — AB (ref >60)
Glucose, Bld: 66 mg/dL — ABNORMAL LOW (ref 70–99)
POTASSIUM: 3.4 mmol/L — AB (ref 3.5–5.1)
SODIUM: 137 mmol/L (ref 135–145)
TOTAL PROTEIN: 7 g/dL (ref 6.5–8.1)
Total Bilirubin: 1.1 mg/dL (ref 0.3–1.2)

## 2014-12-12 LAB — I-STAT CG4 LACTIC ACID, ED: Lactic Acid, Venous: 1.25 mmol/L (ref 0.5–2.0)

## 2014-12-12 LAB — LIPASE, BLOOD: Lipase: 29 U/L (ref 22–51)

## 2014-12-12 MED ORDER — OXYCODONE-ACETAMINOPHEN 5-325 MG PO TABS
1.0000 | ORAL_TABLET | Freq: Once | ORAL | Status: AC
  Administered 2014-12-12: 1 via ORAL

## 2014-12-12 MED ORDER — ONDANSETRON 4 MG PO TBDP
ORAL_TABLET | ORAL | Status: AC
  Filled 2014-12-12: qty 2

## 2014-12-12 MED ORDER — ONDANSETRON 4 MG PO TBDP
8.0000 mg | ORAL_TABLET | Freq: Once | ORAL | Status: AC
  Administered 2014-12-12: 8 mg via ORAL

## 2014-12-12 MED ORDER — ONDANSETRON HCL 4 MG/2ML IJ SOLN
4.0000 mg | Freq: Once | INTRAMUSCULAR | Status: AC
  Administered 2014-12-12: 4 mg via INTRAVENOUS
  Filled 2014-12-12: qty 2

## 2014-12-12 MED ORDER — SODIUM CHLORIDE 0.9 % IV BOLUS (SEPSIS)
500.0000 mL | Freq: Once | INTRAVENOUS | Status: AC
  Administered 2014-12-12: 500 mL via INTRAVENOUS

## 2014-12-12 MED ORDER — HYDROMORPHONE HCL 1 MG/ML IJ SOLN
1.0000 mg | Freq: Once | INTRAMUSCULAR | Status: AC
Start: 2014-12-12 — End: 2014-12-12
  Administered 2014-12-12: 1 mg via INTRAVENOUS
  Filled 2014-12-12: qty 1

## 2014-12-12 MED ORDER — OXYCODONE-ACETAMINOPHEN 5-325 MG PO TABS
ORAL_TABLET | ORAL | Status: AC
  Filled 2014-12-12: qty 1

## 2014-12-12 MED ORDER — HYDROMORPHONE HCL 1 MG/ML IJ SOLN
1.0000 mg | Freq: Once | INTRAMUSCULAR | Status: AC
  Administered 2014-12-12: 1 mg via INTRAVENOUS
  Filled 2014-12-12: qty 1

## 2014-12-12 NOTE — Discharge Summary (Signed)
Physician Discharge Summary  Patient ID:  [name]  MRN: [id number]  DOB/AGE: Jun 15, 1986 29 y.o.  Admit date: 12/05/2014 Discharge date: 12/12/2014  Discharge Diagnoses:  1. Nausea and vomiting and pain       Etiology unclear - his lap band anatomy looks normal  2. Lap band - Placed 11/12/2014 - D. Adison Reifsteck I removed 3 cc from the lap band on 12/06/2014. 3. Hiatal hernia - repaired at the same time as the lap bnad 4. ERSD - Dr. Lenna Sciara. [facility] - MWF 5. HTN 6. 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 Gerkin 8. Arthritis right knee - Dr. Marlou Sa   Active Problems:   Abdominal pain, epigastric   Operation: Procedure(s): ESOPHAGOGASTRODUODENOSCOPY (EGD) on 12/07/2014 - D.Lucia Gaskins  Discharged Condition: fair  Hospital Course: [name] is an 29 y.o. male whose primary care physician is Philis Fendt, MD and who was admitted 12/05/2014 with a chief complaint of  Chief Complaint  Patient presents with  . Abdominal Pain  .   He was brought to the operating room on 12/07/2014 and underwent  Upper endoscopy.    The patient has had 3 CT scans since lap band placement - 11/15/2014, 11/27/2014, 11/29/2014. These have all been unremarkable for surgical complication.  His pre op weight on 10/25/2014 was 298. He weight 291 on admission. So his weight loss is not as dramatic as his symptoms would suggest. Upper endoscopy today showed the lap band anatomy to be entirely normal. Photos in chart.  [name]'s problem with recurrent nausea, vomiting, and pain remain unclear.  He has no obvious diagnostic finding that is treatable.  I talked to him about trying to keep the lap band for 4 to 6 Mackins from surgery (12/12/2014 -12/26/2014) and that if his symptoms continue beyond that, we will have to consider removing the lap band. I have not had a patient who has had a lap band have obstructive symptoms beyond 7 to 10 day and I cannot  explain his pain.  It is rare for [name] to have family around.  His mother lives and home is apparently disabled.  I have met his father one time.  [name] is usually by himself.  The discharge instructions were reviewed with the patient.  Consults: nephrology  Significant Diagnostic Studies: Results for orders placed or performed during the hospital encounter of 12/05/14  MRSA PCR Screening  Result Value Ref Range   MRSA by PCR NEGATIVE NEGATIVE  CBC with Differential  Result Value Ref Range   WBC 6.1 4.0 - 10.5 K/uL   RBC 3.80 (L) 4.22 - 5.81 MIL/uL   Hemoglobin 11.8 (L) 13.0 - 17.0 g/dL   HCT 35.2 (L) 39.0 - 52.0 %   MCV 92.6 78.0 - 100.0 fL   MCH 31.1 26.0 - 34.0 pg   MCHC 33.5 30.0 - 36.0 g/dL   RDW 13.5 11.5 - 15.5 %   Platelets 190 150 - 400 K/uL   Neutrophils Relative % 56 43 - 77 %   Neutro Abs 3.4 1.7 - 7.7 K/uL   Lymphocytes Relative 26 12 - 46 %   Lymphs Abs 1.6 0.7 - 4.0 K/uL   Monocytes Relative 14 (H) 3 - 12 %   Monocytes Absolute 0.9 0.1 - 1.0 K/uL   Eosinophils Relative 3 0 - 5 %   Eosinophils Absolute 0.2 0.0 - 0.7 K/uL   Basophils Relative 1 0 - 1 %   Basophils Absolute 0.0 0.0 - 0.1 K/uL  Comprehensive metabolic  panel  Result Value Ref Range   Sodium 138 135 - 145 mmol/L   Potassium 3.5 3.5 - 5.1 mmol/L   Chloride 100 (L) 101 - 111 mmol/L   CO2 24 22 - 32 mmol/L   Glucose, Bld 87 70 - 99 mg/dL   BUN 5 (L) 6 - 20 mg/dL   Creatinine, Ser 5.25 (H) 0.61 - 1.24 mg/dL   Calcium 10.7 (H) 8.9 - 10.3 mg/dL   Total Protein 7.5 6.5 - 8.1 g/dL   Albumin 3.7 3.5 - 5.0 g/dL   AST 42 (H) 15 - 41 U/L   ALT 51 17 - 63 U/L   Alkaline Phosphatase 60 38 - 126 U/L   Total Bilirubin 0.9 0.3 - 1.2 mg/dL   GFR calc non Af Amer 13 (L) >60 mL/min   GFR calc Af Amer 16 (L) >60 mL/min   Anion gap 14 5 - 15  Lipase, blood  Result Value Ref Range   Lipase 40 22 - 51 U/L  Lipase, blood  Result Value Ref Range   Lipase 40 22 - 51 U/L  Basic metabolic panel  Result  Value Ref Range   Sodium 140 135 - 145 mmol/L   Potassium 3.9 3.5 - 5.1 mmol/L   Chloride 104 101 - 111 mmol/L   CO2 19 (L) 22 - 32 mmol/L   Glucose, Bld 81 70 - 99 mg/dL   BUN 6 6 - 20 mg/dL   Creatinine, Ser 6.79 (H) 0.61 - 1.24 mg/dL   Calcium 9.7 8.9 - 10.3 mg/dL   GFR calc non Af Amer 10 (L) >60 mL/min   GFR calc Af Amer 11 (L) >60 mL/min   Anion gap 17 (H) 5 - 15  CBC  Result Value Ref Range   WBC 4.9 4.0 - 10.5 K/uL   RBC 3.68 (L) 4.22 - 5.81 MIL/uL   Hemoglobin 11.1 (L) 13.0 - 17.0 g/dL   HCT 34.1 (L) 39.0 - 52.0 %   MCV 92.7 78.0 - 100.0 fL   MCH 30.2 26.0 - 34.0 pg   MCHC 32.6 30.0 - 36.0 g/dL   RDW 13.6 11.5 - 15.5 %   Platelets 189 150 - 400 K/uL  Comprehensive metabolic panel  Result Value Ref Range   Sodium 141 135 - 145 mmol/L   Potassium 4.0 3.5 - 5.1 mmol/L   Chloride 105 101 - 111 mmol/L   CO2 21 (L) 22 - 32 mmol/L   Glucose, Bld 73 70 - 99 mg/dL   BUN 14 6 - 20 mg/dL   Creatinine, Ser 11.02 (H) 0.61 - 1.24 mg/dL   Calcium 9.2 8.9 - 10.3 mg/dL   Total Protein 6.6 6.5 - 8.1 g/dL   Albumin 3.4 (L) 3.5 - 5.0 g/dL   AST 17 15 - 41 U/L   ALT 33 17 - 63 U/L   Alkaline Phosphatase 53 38 - 126 U/L   Total Bilirubin 0.7 0.3 - 1.2 mg/dL   GFR calc non Af Amer 5 (L) >60 mL/min   GFR calc Af Amer 6 (L) >60 mL/min   Anion gap 15 5 - 15  CBC  Result Value Ref Range   WBC 7.4 4.0 - 10.5 K/uL   RBC 3.76 (L) 4.22 - 5.81 MIL/uL   Hemoglobin 11.4 (L) 13.0 - 17.0 g/dL   HCT 34.8 (L) 39.0 - 52.0 %   MCV 92.6 78.0 - 100.0 fL   MCH 30.3 26.0 - 34.0 pg   MCHC 32.8 30.0 -  36.0 g/dL   RDW 13.8 11.5 - 15.5 %   Platelets 199 150 - 400 K/uL  I-stat troponin, ED (only if pt is 30 y.o. or older & pain is above umbilicus)  not at [facility], [facility]  Result Value Ref Range   Troponin i, poc 0.00 0.00 - 0.08 ng/mL   Comment 3            Dg Chest 2 View  12/05/2014   CLINICAL DATA:  Chest pain for 1 day.  EXAM: CHEST  2 VIEW  COMPARISON:  PA and lateral chest 04/09/2014.  FINDINGS:  Heart size and mediastinal contours are within normal limits. Both lungs are clear. Visualized skeletal structures are unremarkable.  IMPRESSION: Negative chest.   Electronically Signed   By: Inge Rise M.D.   On: 12/05/2014 20:36   Dg Abd 1 View  12/05/2014   CLINICAL DATA:  Lab band surgery 11/12/2014 mid abdominal pain  EXAM: ABDOMEN - 1 VIEW  COMPARISON:  Plain film 11/27/2014  FINDINGS: The gastric banding device is in expected location at the gastric cardia. Band is oriented from 2 o'clock to 8 o'clock. The access port is in the right lower quadrant with continuous tubing to the cuff. No dilated loops large or small bowel. Small amount gas the rectum.  IMPRESSION: Gastric banding device appears in proper orientation. No evidence of bowel obstruction.   Electronically Signed   By: Suzy Bouchard M.D.   On: 12/05/2014 20:35   Dg Abd 1 View  11/27/2014   CLINICAL DATA:  Abdominal pain and emesis  EXAM: ABDOMEN - 1 VIEW  COMPARISON:  11/25/2014  FINDINGS: Previous laparoscopic gastric banding noted. Scattered air and contrast throughout the bowel. No obstruction pattern. Right pelvic calcifications consistent with venous phleboliths. No acute osseous finding. Obese body habitus.  IMPRESSION: No acute finding by plain radiography.  Negative for obstruction.   Electronically Signed   By: Jerilynn Mages.  Shick M.D.   On: 11/27/2014 21:40   Dg Abd 1 View  11/12/2014   CLINICAL DATA:  Abdominal pain scratch the persistent abdominal pain. Lab and surgery today.  EXAM: ABDOMEN - 1 VIEW  COMPARISON:  None.  FINDINGS: The lap band is positioned at 33 degrees from the horizontal, within normal limits. Gas is present within the more distal stomach. The bowel gas pattern is otherwise normal. The heart size scratch the the heart is mildly enlarged.  IMPRESSION: 1. Interval placement of lap band without radiographic evidence for complication. 2. Stable cardiomegaly without failure   Electronically Signed   By: San Morelle M.D.   On: 11/12/2014 17:20   Ct Abdomen W Contrast  11/29/2014   CLINICAL DATA:  29 year old status post lap band surgery. Recent re- admission for nausea vomiting and increased abdominal pain.  EXAM: CT ABDOMEN WITH CONTRAST  TECHNIQUE: Multidetector CT imaging of the abdomen was performed using the standard protocol following bolus administration of intravenous contrast.  CONTRAST:  145m OMNIPAQUE IOHEXOL 300 MG/ML  SOLN  COMPARISON:  11/25/2014  FINDINGS: Lower chest: There is no pleural effusion identified. The lung bases appear clear.  Hepatobiliary: No suspicious liver abnormality. Vicarious excretion of contrast material into the gallbladder noted. There is no biliary dilatation.  Pancreas: Normal.  Spleen: Normal appearance of the spleen.  Adrenals/Urinary Tract: The adrenal glands are both within normal limits. Bilateral end-stage kidneys containing numerous cysts from dialysis noted. Nonobstructing calculus within the mid right kidney measures 3 mm.  Stomach/Bowel: The gastric band is again  identified and appears to be in stable position. No complicating features are associated with the gastric band. The tubing associated with the band appears to be intact. The small bowel loops have a normal course and caliber. The appendix is visualized and appears normal. No evidence for bowel obstruction. The visualized portions of the colon appear normal.  Vascular/Lymphatic: Normal appearance of the abdominal aorta. No enlarged retroperitoneal or mesenteric adenopathy. No enlarged pelvic or inguinal lymph nodes.  Other: There is no ascites or focal fluid collections within the abdomen or pelvis.  Musculoskeletal: The visualized osseous structures are unremarkable.  IMPRESSION: 1. No acute findings identified within the abdomen. 2. Bilateral end-stage kidneys. 3. Stable position of gastric band.   Electronically Signed   By: Kerby Moors M.D.   On: 11/29/2014 10:19   Ct Abdomen Pelvis W  Contrast  12/10/2014   CLINICAL DATA:  Mid abdominal pain. Dialysis for 12 years. Recent gastric banding on 11/12/2014.  EXAM: CT ABDOMEN AND PELVIS WITH CONTRAST  TECHNIQUE: Multidetector CT imaging of the abdomen and pelvis was performed using the standard protocol following bolus administration of intravenous contrast.  CONTRAST:  183m OMNIPAQUE IOHEXOL 300 MG/ML  SOLN  COMPARISON:  11/29/2014  FINDINGS: Mild dependent atelectasis in the lung bases.  The liver, spleen, gallbladder, pancreas, adrenal glands, abdominal aorta, inferior vena cava, and retroperitoneal lymph nodes are unremarkable. Multiple cysts demonstrated throughout the kidneys suggesting polycystic renal disease. No hydronephrosis in either kidney. Gastric band is present with good location demonstrated. No evidence of proximal dilatation. Stomach, small bowel, and colon are not abnormally dilated. No free air or free fluid in the abdomen.  Pelvis: Prostate gland is not enlarged. Bladder is decompressed. No free or loculated pelvic fluid collections. No pelvic mass or lymphadenopathy. The appendix is normal. No destructive bone lesions.  IMPRESSION: No acute process demonstrated in the abdomen or pelvis to explain symptoms. Appendix is normal. Polycystic renal disease. Gastric band appears in appropriate position.   Electronically Signed   By: WLucienne CapersM.D.   On: 12/10/2014 02:37   Ct Abdomen Pelvis W Contrast  11/25/2014   CLINICAL DATA:  Left upper and lower quadrant abdominal pain associated with nausea and vomiting since yesterday. Prior laparoscopic banding. Current history of end- stage renal disease on hemodialysis.  EXAM: CT ABDOMEN AND PELVIS WITH CONTRAST  TECHNIQUE: Multidetector CT imaging of the abdomen and pelvis was performed using the standard protocol following bolus administration of intravenous contrast.  CONTRAST:  109mOMNIPAQUE IOHEXOL 300 MG/ML IV. Oral contrast was also administered.  COMPARISON:  11/15/2014,  04/09/2010.  FINDINGS: Liver: Mild diffuse hepatic steatosis without focal hepatic parenchymal abnormality.  Biliary: Normal-appearing gallbladder without calcified gallstones. No biliary ductal dilation.  Spleen:  Normal in size and appearance.  Pancreas:  Normal in appearance.  No pancreatic ductal dilation.  Adrenal glands:  Normal in appearance.  Genitourinary: Atrophic kidneys containing numerous cysts and scattered cortical calcifications. Largest cyst approximates 3.8 cm arising from the upper pole of the left kidney. Urinary bladder completely decompressed as the patient does not produce urine.  Prostate gland and seminal vesicles normal in size and appearance for age.  Gastrointestinal: Laparoscopic band appropriately positioned, unchanged from the CT 2 Bueso ago, and there is no discontinuity of the catheter extending from the band to the port. Normal-appearing small bowel and colon. Normal appendix in the right mid abdomen as the cecum is positioned in the right upper quadrant.  Vascular:  No visible aortoiliofemoral atherosclerosis.  Lymphatic:  No pathologic lymphadenopathy in the abdomen or pelvis.  Ascites: Absent.  Musculoskeletal: Mild degenerative changes involving the lower thoracic spine. Renal osteodystrophy.  Other findings: Minimal ecchymosis or possible postoperative fat necrosis at the port site in the subcutaneous tissues of the right abdominal wall. Increased attenuation in the anterior abdominal wall of the right upper abdomen, well above the port, interspersed with fat.  Visualized lower thorax: Heart size normal.  Lung bases clear.  IMPRESSION: 1. No acute abnormalities involving the abdomen or pelvis. 2. No visible complications involving the laparoscopic band or its components. 3. Mild diffuse hepatic steatosis. 4. Increased attenuation in the subcutaneous fat of the right upper abdominal wall, present on the examination 10 days ago and unchanged. Does the patient take subcutaneous  injections? If not, focal fat necrosis is suspected.   Electronically Signed   By: Evangeline Dakin M.D.   On: 11/25/2014 16:54   Ct Abdomen Pelvis W Contrast  11/15/2014   CLINICAL DATA:  Epigastric pain and nausea. Lap band procedure 11/12/2014. Increased white count.  EXAM: CT ABDOMEN AND PELVIS WITH CONTRAST  TECHNIQUE: Multidetector CT imaging of the abdomen and pelvis was performed using the standard protocol following bolus administration of intravenous contrast.  CONTRAST:  171m OMNIPAQUE IOHEXOL 300 MG/ML  SOLN  COMPARISON:  04/09/2010  FINDINGS: BODY WALL: High-density reticulation in the right upper quadrant subcutaneous fat correlating with the laparoscopic port site. No rim enhancing abdominal wall fluid collection.  LOWER CHEST: Atelectasis in the lower lungs.  ABDOMEN/PELVIS:  Liver: Hepatic steatosis.  No focal abnormality.  Biliary: No evidence of biliary obstruction or stone.  Pancreas: Unremarkable.  Spleen: Unremarkable.  Adrenals: Unremarkable.  Kidneys and ureters: Small polycystic kidneys consistent with dialysis history. No evidence for solid mass.  Bladder: Decompressed.  Reproductive: No pathologic findings.  Bowel: Lap band has a normal angle. There is no infiltration or fluid collection around the lap band to suggest abscess or gastric erosions/perforation. A small amount of oral contrast is noted in the proximal stomach. No bowel obstruction. Appendicolith without appendicitis.  Retroperitoneum: No mass or adenopathy.  Peritoneum: No ascites or pneumoperitoneum.  Vascular: No acute abnormality.  OSSEOUS: Dense bones with subchondral lucency along the sacroiliac joints.  IMPRESSION: 1. No acute findings.  No evidence of lap band complication. 2. Small hemorrhage in the right upper quadrant port site. 3. Bibasilar atelectasis. 4. 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 5. Renal osteodystrophy. 6. Hepatic steatosis.   Electronically Signed   By: JMonte FantasiaM.D.   On: 11/15/2014  03:52   Dg Abd Acute W/chest  11/15/2014   CLINICAL DATA:  Initial evaluation for acute epigastric pain. Status post recent lap band placement  EXAM: DG ABDOMEN ACUTE W/ 1V CHEST  COMPARISON:  Prior study from 11/12/2014  FINDINGS: Cardiac and mediastinal silhouettes within normal limits.  Lungs are mildly hypoinflated. There is secondary mild bibasilar atelectasis. No focal infiltrate, pulmonary edema, or pleural effusion.  Surgical clips overlie the lower neck.  Gastric lap band in place. No interval complication. Multiple mildly prominent gas-filled loops of small bowel seen within the left abdomen. Few scattered internal air-fluid levels. Air seen within the nondilated gas-filled descending colon as well. Findings may reflect postoperative ileus or possibly developing/early small bowel obstruction. No free air. No soft tissue mass or abnormal calcification.  No acute osseus abnormality.  IMPRESSION: 1. Multiple mildly prominent gas-filled loops of small bowel with a few associated internal air-fluid levels. Findings favored to reflect postoperative ileus  given history of recent lap band procedure. Close interval follow-up recommended if there is clinical concern for possible early/developing small bowel obstruction. 2. Gastric lap band in place without complication. 3. No active cardiopulmonary disease.   Electronically Signed   By: Jeannine Boga M.D.   On: 11/15/2014 00:55    Discharge Exam:  Filed Vitals:   12/08/14 0845  BP: 113/64  Pulse: 84  Temp: 99 F (37.2 C)  Resp: 18    General: Obese AA M who is alert.  Lungs: Clear to auscultation and symmetric breath sounds. Heart:  RRR. No murmur or rub. Abdomen: Soft. No mass. No tenderness. No hernia. Normal bowel sounds.  Extremities:  Good strength and ROM  in upper and lower extremities.   Discharge Medications:     Medication List    TAKE these medications        omeprazole 20 MG capsule  Commonly known as:  PRILOSEC   Take 20 mg by mouth daily as needed (for heartburn).     ondansetron 4 MG disintegrating tablet  Commonly known as:  ZOFRAN ODT  Take 1 tablet (4 mg total) by mouth every 8 (eight) hours as needed for nausea or vomiting.     oxyCODONE 5 MG immediate release tablet  Commonly known as:  Oxy IR/ROXICODONE  Take 1 tablet (5 mg total) by mouth every 3 (three) hours as needed for moderate pain.     oxyCODONE 5 MG/5ML solution  Commonly known as:  ROXICODONE  Take 5 mLs (5 mg total) by mouth every 4 (four) hours as needed for moderate pain or severe pain.     sevelamer carbonate 2.4 G Pack  Commonly known as:  RENVELA  Take 2.4 g by mouth 3 (three) times daily with meals.        Disposition: 01-Home or Self Care        Follow-up Information    Follow up with Nolon Yellin H, MD. Schedule an appointment as soon as possible for a visit in [place].   Specialty:  General Surgery   Contact information:   [address] [place] [phone]        Signed: Alphonsa Overall, M.D., [facility] Office:  [phone]  12/12/2014, 8:42 AM

## 2014-12-12 NOTE — ED Notes (Signed)
He was last seen here sunday

## 2014-12-12 NOTE — ED Notes (Signed)
The pt ius a dialysis pt and hge had lap band surgery April 11th since then he has had abd and back pain and he has been here numerus times for the same.  opartial dialysis tiodayt.  He is to see h=the surgeoin toimoirrow  Fistula lkt arm

## 2014-12-12 NOTE — Discharge Instructions (Signed)

## 2014-12-12 NOTE — ED Provider Notes (Signed)
CSN: [id number]     Arrival date & time 12/12/14  1448 History   First MD Initiated Contact with Patient 12/12/14 1900     Chief Complaint  Patient presents with  . Abdominal Pain     (Consider location/radiation/quality/duration/timing/severity/associated sxs/prior Treatment) HPI Comments: 29 year old male status Carsin Randazzo lap band surgery 1 month ago comes with abdominal pain. Patient has been seen multiple times over the past month for consistent abdominal pain since this procedure. Returns today with similar pain. Pain the epigastric right upper quadrant and left upper quadrant without radiation sharp stabbing pain. States she's been unable to take by mouth. Patient is ESRD patient has not missed dialysis  Patient is a 29 y.o. male presenting with abdominal pain.  Abdominal Pain Pain location:  Epigastric, LUQ and RUQ Pain quality: sharp   Pain radiates to:  Does not radiate Pain severity:  Severe Onset quality:  Gradual Timing:  Constant Progression:  Unchanged Relieved by:  Nothing Ineffective treatments:  Movement (Palpation) Associated symptoms: nausea   Associated symptoms: no cough and no fever     Past Medical History  Diagnosis Date  . Hyperparathyroidism   . Morbid obesity   . Arthritis   . Complication of anesthesia     A little while to wake up after knee surgery in 2008  . Family history of anesthesia complication     mom slow to wake up  . Headache(784.0)     occasionally  . Dizziness     when coming off of dialysis  . Joint pain   . Joint swelling   . GERD (gastroesophageal reflux disease)     takes Omeprazole as needed  . Hypertension   . Renal disorder     MWF and goes to [facility]  . Renal insufficiency    Past Surgical History  Procedure Laterality Date  . Av fistula placement Bilateral     Uses Right arm  . Esophagogastroduodenoscopy endoscopy    . Knee arthroscopy Left 2007  . Parathyroidectomy N/A 03/30/2013    Procedure: TOTAL  PARATHYROIDECTOMY WITH AUTOTRANSPLANT;  Surgeon: Earnstine Regal, MD;  Location: [facility];  Service: General;  Laterality: N/A;  Autotransplant to left lower arm.  . Total knee arthroplasty Right 03/27/2014    DR Marlou Sa  . Total knee arthroplasty Right 03/27/2014    Procedure: UNICOMPARTMENTAL ARTHROPLASTY;  Surgeon: Meredith Pel, MD;  Location: [facility];  Service: Orthopedics;  Laterality: Right;  . Breath tek h pylori N/A 05/15/2014    Procedure: BREATH TEK H PYLORI;  Surgeon: Alphonsa Overall, MD;  Location: [facility];  Service: General;  Laterality: N/A;  . Laparoscopic gastric banding N/A 11/12/2014    Procedure: LAPAROSCOPIC GASTRIC BANDING;  Surgeon: Alphonsa Overall, MD;  Location: [facility];  Service: General;  Laterality: N/A;  . Esophagogastroduodenoscopy N/A 12/07/2014    Procedure: ESOPHAGOGASTRODUODENOSCOPY (EGD);  Surgeon: Alphonsa Overall, MD;  Location: [facility] ENDOSCOPY;  Service: General;  Laterality: N/A;   No family history on file. History  Substance Use Topics  . Smoking status: Current Some Day Smoker -- 0.20 packs/day for 13 years    Types: Cigarettes  . Smokeless tobacco: Never Used     Comment: 2 cigs a day  . Alcohol Use: No    Review of Systems  Constitutional: Negative for fever.  HENT: Negative for congestion.   Respiratory: Negative for cough.   Gastrointestinal: Positive for nausea and abdominal pain.  Musculoskeletal: Negative for back pain.  Skin: Negative for rash.  Neurological: Negative for headaches.  All other systems reviewed and are negative.     Allergies  Review of patient's allergies indicates no known allergies.  Home Medications   Prior to Admission medications   Medication Sig Start Date End Date Taking? Authorizing Provider  flintstones complete (FLINTSTONES) 60 MG chewable tablet Chew 1 tablet by mouth daily.   Yes Historical Provider, MD  omeprazole (PRILOSEC) 20 MG capsule Take 20 mg by mouth daily as needed (for heartburn).    Yes Historical  Provider, MD  ondansetron (ZOFRAN ODT) 4 MG disintegrating tablet Take 1 tablet (4 mg total) by mouth every 8 (eight) hours as needed for nausea or vomiting. 11/25/14  Yes Waynetta Pean, [name]  oxyCODONE (ROXICODONE) 5 MG/5ML solution Take 5 mLs (5 mg total) by mouth every 4 (four) hours as needed for moderate pain or severe pain. 12/08/14  Yes Saverio Danker, [name]  sevelamer carbonate (RENVELA) 2.4 G PACK Take 2.4 g by mouth 3 (three) times daily with meals.   Yes Historical Provider, MD  oxyCODONE (OXY IR/ROXICODONE) 5 MG immediate release tablet Take 1 tablet (5 mg total) by mouth every 3 (three) hours as needed for moderate pain. Patient not taking: Reported on 12/12/2014 12/01/14   Velvet Bathe, MD   BP 129/76 mmHg  Pulse 76  Temp(Src) 98.6 F (37 C)  Resp 16  Ht 5\' 5"  (1.651 m)  Wt 286 lb (129.729 kg)  BMI 47.59 kg/m2  SpO2 99% Physical Exam  Constitutional: He is oriented to person, place, and time. He appears well-developed.  HENT:  Head: Normocephalic.  Eyes: Pupils are equal, round, and reactive to light.  Neck: Normal range of motion.  Cardiovascular: Normal rate.   No murmur heard. Pulmonary/Chest: Effort normal. No respiratory distress.  Abdominal: Soft. There is tenderness.  Tenderness palpation of the left and right upper quadrants as well as epigastric area. No peritonitis or rebound. Lower quadrants normal. Incision wounds dry, well-healing without drainage  Musculoskeletal: He exhibits no tenderness.  Neurological: He is alert and oriented to person, place, and time. No cranial nerve deficit. He exhibits normal muscle tone.  Skin: Skin is warm.  Psychiatric: He has a normal mood and affect.  Vitals reviewed.   ED Course  Procedures (including critical care time) Labs Review Labs Reviewed  CBC WITH DIFFERENTIAL/PLATELET - Abnormal; Notable for the following:    RBC 3.93 (*)    Hemoglobin 11.9 (*)    HCT 35.6 (*)    All other components within normal limits   COMPREHENSIVE METABOLIC PANEL - Abnormal; Notable for the following:    Potassium 3.4 (*)    Chloride 98 (*)    Glucose, Bld 66 (*)    Creatinine, Ser 9.19 (*)    AST 10 (*)    ALT 13 (*)    GFR calc non Af Amer 7 (*)    GFR calc Af Amer 8 (*)    Anion gap 17 (*)    All other components within normal limits  LIPASE, BLOOD  I-STAT CG4 LACTIC ACID, ED    Imaging Review No results found.   EKG Interpretation None      MDM  29 year old male well-known to the emergency Department was back with recurrent abdominal pain. Patient is Howard Bunte lap band. Patient's been evaluated for this multiple times most recently approximately 24-48 hours ago. That time he had a CT scan as well as full labs. Patient returns today with similar sinus symptoms. Overall patient's abdominal exam is  fairly benign which is tenderness palpation the upper quadrants however no rebound guarding or peritonitis. Patient without vomiting and normal lactic acid. Not consistent with a outlet obstruction from lap band. History and exam not consistent with other serious intra-abdominal such as appendicitis gallbladder mesenteric ischemia etc. Patient was given multiple doses of IV pain medication, IV antiemetics and IV fluids. Basic labs were obtained CBC and CMP approximately normal for this patient given yesterday. Given patient had been scanned less than 2 days ago, and has received multiple CT scans for similar pain in the past month, do not believe a CT scan is medically indicated at this time. Discussed the patient is in agreement. Also discussed this patient with on-call surgeon who agrees patient needs to follow up with outpatient with his surgeon. No indication for inpatient management scans etc. Discussed return precautions patient was discharged. Has follow-up with surgeon in morning.   Final diagnoses:  Abdominal pain, epigastric        Robynn Pane, MD 12/12/14 2128  Noemi Chapel, MD 12/12/14 2141

## 2014-12-13 ENCOUNTER — Other Ambulatory Visit: Payer: Self-pay | Admitting: Surgery

## 2014-12-13 ENCOUNTER — Other Ambulatory Visit (HOSPITAL_COMMUNITY): Payer: Self-pay | Admitting: *Deleted

## 2014-12-13 NOTE — Pre-Procedure Instructions (Addendum)
[name]  12/13/2014   Your procedure is scheduled on:  Monday, Dec 17, 2014. Please call [phone] at 8:00 AM to get surgery time.   Report to [facility] Entrance "A" Admitting Office two hours prior to surgery time.   Call this number if you have problems the morning of surgery: [phone]   Remember:   Do not eat food or drink liquids after midnight Sunday, 12/16/14.   Take these medicines the morning of surgery with A SIP OF WATER: Omeprazole (Prilosec) - if needed, Oxycodone - if needed.  Stop ASPIRIN, COUMADIN, PLAVIX, EFFIENT, HERBAL MEDICINES AND Vitamins as of today.   Do not wear jewelry.  Do not wear lotions, powders, or cologne. You may wear deodorant.  Men may shave face and neck.  Do not bring valuables to the hospital.  [facility] is not responsible                  for any belongings or valuables.               Contacts, dentures or bridgework may not be worn into surgery.  Leave suitcase in the car. After surgery it may be brought to your room.  For patients admitted to the hospital, discharge time is determined by your                treatment team.                 Special Instructions: See "Preparing for Surgery" Instruction sheet.    Please read over the following fact sheets that you were given: Pain Booklet, Coughing and Deep Breathing and Surgical Site Infection Prevention

## 2014-12-14 ENCOUNTER — Encounter (HOSPITAL_COMMUNITY): Payer: Self-pay

## 2014-12-14 ENCOUNTER — Encounter (HOSPITAL_COMMUNITY)
Admission: RE | Admit: 2014-12-14 | Discharge: 2014-12-14 | Disposition: A | Discharge status: Home / Self Care | Payer: Medicare Other | Source: Ambulatory Visit | Attending: Surgery | Admitting: Surgery

## 2014-12-14 HISTORY — DX: Other specified postprocedural states: Z98.890

## 2014-12-14 HISTORY — DX: Nausea with vomiting, unspecified: R11.2

## 2014-12-14 LAB — BASIC METABOLIC PANEL
ANION GAP: 18 — AB (ref 5–15)
BUN: 18 mg/dL (ref 6–20)
CALCIUM: 8.5 mg/dL — AB (ref 8.9–10.3)
CHLORIDE: 98 mmol/L — AB (ref 101–111)
CO2: 22 mmol/L (ref 22–32)
Creatinine, Ser: 14.3 mg/dL — ABNORMAL HIGH (ref 0.61–1.24)
GFR, EST AFRICAN AMERICAN: 5 mL/min — AB (ref >60)
GFR, EST NON AFRICAN AMERICAN: 4 mL/min — AB (ref >60)
GLUCOSE: 66 mg/dL (ref 65–99)
Potassium: 3.7 mmol/L (ref 3.5–5.1)
SODIUM: 138 mmol/L (ref 135–145)

## 2014-12-14 LAB — CBC
HCT: 34.7 % — ABNORMAL LOW (ref 39.0–52.0)
Hemoglobin: 11.6 g/dL — ABNORMAL LOW (ref 13.0–17.0)
MCH: 30.3 pg (ref 26.0–34.0)
MCHC: 33.4 g/dL (ref 30.0–36.0)
MCV: 90.6 fL (ref 78.0–100.0)
Platelets: 214 10*3/uL (ref 150–400)
RBC: 3.83 MIL/uL — ABNORMAL LOW (ref 4.22–5.81)
RDW: 14.3 % (ref 11.5–15.5)
WBC: 7.5 10*3/uL (ref 4.0–10.5)

## 2014-12-14 NOTE — Progress Notes (Signed)
Pt called with time to arrive at 1345 on Monday, 12/17/14 with understanding verbalized.

## 2014-12-17 ENCOUNTER — Inpatient Hospital Stay (HOSPITAL_COMMUNITY): Payer: Medicare Other | Admitting: Certified Registered Nurse Anesthetist

## 2014-12-17 ENCOUNTER — Encounter (HOSPITAL_COMMUNITY): Payer: Self-pay | Admitting: Certified Registered Nurse Anesthetist

## 2014-12-17 ENCOUNTER — Encounter (HOSPITAL_COMMUNITY)
Admission: RE | Disposition: A | Discharge status: Home / Self Care | Payer: Self-pay | Source: Ambulatory Visit | Attending: Surgery

## 2014-12-17 ENCOUNTER — Inpatient Hospital Stay (HOSPITAL_COMMUNITY)
Admission: RE | Admit: 2014-12-17 | Discharge: 2014-12-19 | DRG: 987 | Disposition: A | Discharge status: Home / Self Care | Payer: Medicare Other | Source: Ambulatory Visit | Attending: Surgery | Admitting: Surgery

## 2014-12-17 DIAGNOSIS — F17200 Nicotine dependence, unspecified, uncomplicated: Secondary | ICD-10-CM | POA: Diagnosis present

## 2014-12-17 DIAGNOSIS — M179 Osteoarthritis of knee, unspecified: Secondary | ICD-10-CM | POA: Diagnosis present

## 2014-12-17 DIAGNOSIS — Z6841 Body Mass Index (BMI) 40.0 and over, adult: Secondary | ICD-10-CM

## 2014-12-17 DIAGNOSIS — D649 Anemia, unspecified: Secondary | ICD-10-CM | POA: Diagnosis present

## 2014-12-17 DIAGNOSIS — Z992 Dependence on renal dialysis: Secondary | ICD-10-CM

## 2014-12-17 DIAGNOSIS — Z720 Tobacco use: Secondary | ICD-10-CM | POA: Diagnosis not present

## 2014-12-17 DIAGNOSIS — Y838 Other surgical procedures as the cause of abnormal reaction of the patient, or of later complication, without mention of misadventure at the time of the procedure: Secondary | ICD-10-CM | POA: Diagnosis present

## 2014-12-17 DIAGNOSIS — I120 Hypertensive chronic kidney disease with stage 5 chronic kidney disease or end stage renal disease: Secondary | ICD-10-CM | POA: Diagnosis present

## 2014-12-17 DIAGNOSIS — Z79899 Other long term (current) drug therapy: Secondary | ICD-10-CM

## 2014-12-17 DIAGNOSIS — R109 Unspecified abdominal pain: Secondary | ICD-10-CM | POA: Diagnosis present

## 2014-12-17 DIAGNOSIS — N186 End stage renal disease: Secondary | ICD-10-CM | POA: Diagnosis present

## 2014-12-17 DIAGNOSIS — K9509 Other complications of gastric band procedure: Secondary | ICD-10-CM | POA: Diagnosis present

## 2014-12-17 DIAGNOSIS — K219 Gastro-esophageal reflux disease without esophagitis: Secondary | ICD-10-CM | POA: Diagnosis present

## 2014-12-17 DIAGNOSIS — Z7982 Long term (current) use of aspirin: Secondary | ICD-10-CM

## 2014-12-17 DIAGNOSIS — Z01812 Encounter for preprocedural laboratory examination: Secondary | ICD-10-CM

## 2014-12-17 DIAGNOSIS — R112 Nausea with vomiting, unspecified: Secondary | ICD-10-CM | POA: Diagnosis present

## 2014-12-17 HISTORY — PX: ESOPHAGOGASTRODUODENOSCOPY: SHX5428

## 2014-12-17 LAB — BASIC METABOLIC PANEL
Anion gap: 17 — ABNORMAL HIGH (ref 5–15)
BUN: 20 mg/dL (ref 6–20)
CALCIUM: 8 mg/dL — AB (ref 8.9–10.3)
CO2: 18 mmol/L — ABNORMAL LOW (ref 22–32)
CREATININE: 14.82 mg/dL — AB (ref 0.61–1.24)
Chloride: 105 mmol/L (ref 101–111)
GFR calc Af Amer: 4 mL/min — ABNORMAL LOW (ref >60)
GFR calc non Af Amer: 4 mL/min — ABNORMAL LOW (ref >60)
GLUCOSE: 81 mg/dL (ref 65–99)
Potassium: 4.3 mmol/L (ref 3.5–5.1)
SODIUM: 140 mmol/L (ref 135–145)

## 2014-12-17 LAB — POCT I-STAT 4, (NA,K, GLUC, HGB,HCT)
GLUCOSE: 79 mg/dL (ref 65–99)
HCT: 33 % — ABNORMAL LOW (ref 39.0–52.0)
HEMOGLOBIN: 11.2 g/dL — AB (ref 13.0–17.0)
POTASSIUM: 3.4 mmol/L — AB (ref 3.5–5.1)
Sodium: 139 mmol/L (ref 135–145)

## 2014-12-17 SURGERY — REMOVAL, GASTRIC BAND, LAPAROSCOPIC
Anesthesia: General | Site: Abdomen

## 2014-12-17 MED ORDER — FENTANYL CITRATE (PF) 100 MCG/2ML IJ SOLN
INTRAMUSCULAR | Status: DC | PRN
  Administered 2014-12-17 (×4): 50 ug via INTRAVENOUS
  Administered 2014-12-17: 150 ug via INTRAVENOUS
  Administered 2014-12-17 (×3): 50 ug via INTRAVENOUS

## 2014-12-17 MED ORDER — BUPIVACAINE-EPINEPHRINE (PF) 0.25% -1:200000 IJ SOLN
INTRAMUSCULAR | Status: AC
  Filled 2014-12-17: qty 30

## 2014-12-17 MED ORDER — CISATRACURIUM BESYLATE (PF) 10 MG/5ML IV SOLN
INTRAVENOUS | Status: DC | PRN
  Administered 2014-12-17: 10 mg via INTRAVENOUS

## 2014-12-17 MED ORDER — SUCCINYLCHOLINE CHLORIDE 20 MG/ML IJ SOLN
INTRAMUSCULAR | Status: DC | PRN
  Administered 2014-12-17: 160 mg via INTRAVENOUS

## 2014-12-17 MED ORDER — GLYCOPYRROLATE 0.2 MG/ML IJ SOLN
INTRAMUSCULAR | Status: DC | PRN
  Administered 2014-12-17: 0.6 mg via INTRAVENOUS

## 2014-12-17 MED ORDER — FENTANYL CITRATE (PF) 250 MCG/5ML IJ SOLN
INTRAMUSCULAR | Status: AC
  Filled 2014-12-17: qty 5

## 2014-12-17 MED ORDER — PROMETHAZINE HCL 25 MG/ML IJ SOLN
6.2500 mg | INTRAMUSCULAR | Status: DC | PRN
Start: 2014-12-17 — End: 2014-12-17

## 2014-12-17 MED ORDER — BUPIVACAINE-EPINEPHRINE 0.25% -1:200000 IJ SOLN
INTRAMUSCULAR | Status: DC | PRN
  Administered 2014-12-17: 30 mL

## 2014-12-17 MED ORDER — NEOSTIGMINE METHYLSULFATE 10 MG/10ML IV SOLN
INTRAVENOUS | Status: DC | PRN
  Administered 2014-12-17: 4 mg via INTRAVENOUS

## 2014-12-17 MED ORDER — PHENYLEPHRINE HCL 10 MG/ML IJ SOLN
INTRAMUSCULAR | Status: AC
  Filled 2014-12-17: qty 3

## 2014-12-17 MED ORDER — HYDROMORPHONE HCL 1 MG/ML IJ SOLN
0.2500 mg | INTRAMUSCULAR | Status: DC | PRN
  Administered 2014-12-17 (×2): 0.5 mg via INTRAVENOUS

## 2014-12-17 MED ORDER — EPHEDRINE SULFATE 50 MG/ML IJ SOLN
INTRAMUSCULAR | Status: AC
  Filled 2014-12-17: qty 1

## 2014-12-17 MED ORDER — SODIUM CHLORIDE 0.9 % IV SOLN
INTRAVENOUS | Status: DC
  Administered 2014-12-17 (×3): via INTRAVENOUS

## 2014-12-17 MED ORDER — OXYCODONE HCL 5 MG PO TABS: 5.0000 mg | ORAL_TABLET | ORAL | Status: DC | PRN

## 2014-12-17 MED ORDER — PHENYLEPHRINE HCL 10 MG/ML IJ SOLN
INTRAMUSCULAR | Status: DC | PRN
  Administered 2014-12-17 (×2): 120 ug via INTRAVENOUS
  Administered 2014-12-17 (×2): 80 ug via INTRAVENOUS
  Administered 2014-12-17 (×2): 120 ug via INTRAVENOUS
  Administered 2014-12-17: 80 ug via INTRAVENOUS
  Administered 2014-12-17: 40 ug via INTRAVENOUS
  Administered 2014-12-17: 80 ug via INTRAVENOUS

## 2014-12-17 MED ORDER — SODIUM CHLORIDE 0.45 % IV SOLN: INTRAVENOUS | Status: DC

## 2014-12-17 MED ORDER — CHLORHEXIDINE GLUCONATE 4 % EX LIQD
1.0000 "application " | Freq: Once | CUTANEOUS | Status: DC
  Filled 2014-12-17: qty 15

## 2014-12-17 MED ORDER — PROMETHAZINE HCL 25 MG/ML IJ SOLN
INTRAMUSCULAR | Status: AC
  Filled 2014-12-17: qty 1

## 2014-12-17 MED ORDER — SEVELAMER CARBONATE 2.4 G PO PACK
2.4000 g | PACK | Freq: Three times a day (TID) | ORAL | Status: DC
  Administered 2014-12-18 – 2014-12-19 (×3): 2.4 g via ORAL
  Filled 2014-12-17 (×7): qty 1

## 2014-12-17 MED ORDER — PROPOFOL 10 MG/ML IV BOLUS
INTRAVENOUS | Status: AC
  Filled 2014-12-17: qty 20

## 2014-12-17 MED ORDER — HYDROMORPHONE HCL 1 MG/ML IJ SOLN
INTRAMUSCULAR | Status: AC
  Filled 2014-12-17: qty 1

## 2014-12-17 MED ORDER — PROMETHAZINE HCL 25 MG PO TABS: 25.0000 mg | ORAL_TABLET | Freq: Four times a day (QID) | ORAL | Status: DC | PRN

## 2014-12-17 MED ORDER — HEPARIN SODIUM (PORCINE) 5000 UNIT/ML IJ SOLN
5000.0000 [IU] | Freq: Three times a day (TID) | INTRAMUSCULAR | Status: DC
  Administered 2014-12-18 – 2014-12-19 (×4): 5000 [IU] via SUBCUTANEOUS
  Filled 2014-12-17 (×6): qty 1

## 2014-12-17 MED ORDER — CALCITRIOL 0.5 MCG PO CAPS
0.5000 ug | ORAL_CAPSULE | ORAL | Status: DC
  Administered 2014-12-19: 0.5 ug via ORAL
  Filled 2014-12-17 (×3): qty 1

## 2014-12-17 MED ORDER — GLYCOPYRROLATE 0.2 MG/ML IJ SOLN
INTRAMUSCULAR | Status: AC
  Filled 2014-12-17: qty 1

## 2014-12-17 MED ORDER — ONDANSETRON HCL 4 MG/2ML IJ SOLN
INTRAMUSCULAR | Status: DC | PRN
  Administered 2014-12-17: 4 mg via INTRAVENOUS

## 2014-12-17 MED ORDER — FENTANYL CITRATE (PF) 100 MCG/2ML IJ SOLN
INTRAMUSCULAR | Status: AC
  Administered 2014-12-17: 100 ug via INTRAVENOUS
  Filled 2014-12-17: qty 2

## 2014-12-17 MED ORDER — PROPOFOL 10 MG/ML IV BOLUS
INTRAVENOUS | Status: DC | PRN
  Administered 2014-12-17: 200 mg via INTRAVENOUS

## 2014-12-17 MED ORDER — ROCURONIUM BROMIDE 50 MG/5ML IV SOLN
INTRAVENOUS | Status: AC
  Filled 2014-12-17: qty 1

## 2014-12-17 MED ORDER — ONDANSETRON 4 MG PO TBDP: 4.0000 mg | ORAL_TABLET | Freq: Three times a day (TID) | ORAL | Status: DC | PRN

## 2014-12-17 MED ORDER — FENTANYL CITRATE (PF) 100 MCG/2ML IJ SOLN
100.0000 ug | Freq: Once | INTRAMUSCULAR | Status: AC
  Administered 2014-12-17: 100 ug via INTRAVENOUS

## 2014-12-17 MED ORDER — PHENYLEPHRINE 40 MCG/ML (10ML) SYRINGE FOR IV PUSH (FOR BLOOD PRESSURE SUPPORT)
PREFILLED_SYRINGE | INTRAVENOUS | Status: AC
  Filled 2014-12-17: qty 30

## 2014-12-17 MED ORDER — LIDOCAINE HCL (CARDIAC) 20 MG/ML IV SOLN
INTRAVENOUS | Status: AC
  Filled 2014-12-17: qty 5

## 2014-12-17 MED ORDER — PANTOPRAZOLE SODIUM 40 MG PO TBEC
40.0000 mg | DELAYED_RELEASE_TABLET | Freq: Every day | ORAL | Status: DC
  Administered 2014-12-18 – 2014-12-19 (×2): 40 mg via ORAL
  Filled 2014-12-17: qty 1

## 2014-12-17 MED ORDER — MORPHINE SULFATE 2 MG/ML IJ SOLN
1.0000 mg | INTRAMUSCULAR | Status: DC | PRN
  Administered 2014-12-18 – 2014-12-19 (×8): 2 mg via INTRAVENOUS
  Filled 2014-12-17 (×5): qty 1

## 2014-12-17 MED ORDER — ROCURONIUM BROMIDE 50 MG/5ML IV SOLN
INTRAVENOUS | Status: AC
  Filled 2014-12-17: qty 2

## 2014-12-17 MED ORDER — SUCCINYLCHOLINE CHLORIDE 20 MG/ML IJ SOLN
INTRAMUSCULAR | Status: AC
  Filled 2014-12-17: qty 1

## 2014-12-17 MED ORDER — OXYCODONE HCL 5 MG/5ML PO SOLN
5.0000 mg | ORAL | Status: DC | PRN
  Administered 2014-12-18: 5 mg via ORAL
  Filled 2014-12-17: qty 5

## 2014-12-17 MED ORDER — MIDAZOLAM HCL 2 MG/2ML IJ SOLN
INTRAMUSCULAR | Status: AC
  Filled 2014-12-17: qty 2

## 2014-12-17 MED ORDER — SODIUM CHLORIDE 0.9 % IR SOLN
Status: DC | PRN
  Administered 2014-12-17: 1000 mL

## 2014-12-17 MED ORDER — NEOSTIGMINE METHYLSULFATE 10 MG/10ML IV SOLN
INTRAVENOUS | Status: AC
  Filled 2014-12-17: qty 1

## 2014-12-17 SURGICAL SUPPLY — 73 items
BLADE SURG ROTATE 9660 (MISCELLANEOUS) IMPLANT
CANISTER SUCTION 2500CC (MISCELLANEOUS) ×3 IMPLANT
CHLORAPREP W/TINT 26ML (MISCELLANEOUS) ×3 IMPLANT
COVER SURGICAL LIGHT HANDLE (MISCELLANEOUS) ×3 IMPLANT
DEVICE TROCAR PUNCTURE CLOSURE (ENDOMECHANICALS) IMPLANT
DRAIN CHANNEL 19F RND (DRAIN) IMPLANT
DRAIN PENROSE 1/2X36 STERILE (WOUND CARE) IMPLANT
DRAPE LAPAROSCOPIC ABDOMINAL (DRAPES) ×3 IMPLANT
DRAPE UTILITY XL STRL (DRAPES) ×6 IMPLANT
DRAPE WARM FLUID 44X44 (DRAPE) ×3 IMPLANT
DRSG COVADERM 4X10 (GAUZE/BANDAGES/DRESSINGS) IMPLANT
DRSG COVADERM 4X14 (GAUZE/BANDAGES/DRESSINGS) IMPLANT
ELECT BLADE 6.5 EXT (BLADE) IMPLANT
ELECT CAUTERY BLADE 6.4 (BLADE) ×3 IMPLANT
ELECT REM PT RETURN 9FT ADLT (ELECTROSURGICAL) ×3
ELECTRODE REM PT RTRN 9FT ADLT (ELECTROSURGICAL) ×2 IMPLANT
EVACUATOR SILICONE 100CC (DRAIN) IMPLANT
GAUZE SPONGE 4X4 12PLY STRL (GAUZE/BANDAGES/DRESSINGS) IMPLANT
GLOVE BIO SURGEON STRL SZ 6 (GLOVE) ×3 IMPLANT
GLOVE BIO SURGEON STRL SZ7.5 (GLOVE) ×3 IMPLANT
GLOVE BIOGEL PI IND STRL 6.5 (GLOVE) ×2 IMPLANT
GLOVE BIOGEL PI IND STRL 7.5 (GLOVE) ×2 IMPLANT
GLOVE BIOGEL PI INDICATOR 6.5 (GLOVE) ×1
GLOVE BIOGEL PI INDICATOR 7.5 (GLOVE) ×1
GOWN STRL REUS W/ TWL LRG LVL3 (GOWN DISPOSABLE) ×6 IMPLANT
GOWN STRL REUS W/TWL 2XL LVL3 (GOWN DISPOSABLE) ×3 IMPLANT
GOWN STRL REUS W/TWL LRG LVL3 (GOWN DISPOSABLE) ×3
KIT BASIN OR (CUSTOM PROCEDURE TRAY) ×3 IMPLANT
KIT ROOM TURNOVER OR (KITS) ×3 IMPLANT
LIQUID BAND (GAUZE/BANDAGES/DRESSINGS) ×3 IMPLANT
LOOP VESSEL MAXI BLUE (MISCELLANEOUS) IMPLANT
NEEDLE SPNL 18GX3.5 QUINCKE PK (NEEDLE) ×3 IMPLANT
NS IRRIG 1000ML POUR BTL (IV SOLUTION) ×6 IMPLANT
PAD ARMBOARD 7.5X6 YLW CONV (MISCELLANEOUS) ×6 IMPLANT
PENCIL BUTTON HOLSTER BLD 10FT (ELECTRODE) ×3 IMPLANT
POUCH SPECIMEN RETRIEVAL 10MM (ENDOMECHANICALS) IMPLANT
RELOAD BLUE (STAPLE) IMPLANT
RELOAD WHITE ECR60W (STAPLE) IMPLANT
RETRACTOR WND ALEXIS 25 LRG (MISCELLANEOUS) IMPLANT
RTRCTR WOUND ALEXIS 25CM LRG (MISCELLANEOUS)
SCALPEL HARMONIC ACE (MISCELLANEOUS) IMPLANT
SCISSORS LAP 5X35 DISP (ENDOMECHANICALS) ×3 IMPLANT
SEALANT SURGICAL APPL DUAL CAN (MISCELLANEOUS) IMPLANT
SET IRRIG TUBING LAPAROSCOPIC (IRRIGATION / IRRIGATOR) ×3 IMPLANT
SLEEVE ENDOPATH XCEL 5M (ENDOMECHANICALS) ×6 IMPLANT
STAPLE ECHEON FLEX 60 POW ENDO (STAPLE) IMPLANT
STAPLER VISISTAT 35W (STAPLE) IMPLANT
SUT ETHILON 2 0 FS 18 (SUTURE) IMPLANT
SUT MNCRL AB 4-0 PS2 18 (SUTURE) ×9 IMPLANT
SUT PDS AB 1 TP1 96 (SUTURE) IMPLANT
SUT PDS II 0 TP-1 LOOPED 60 (SUTURE) IMPLANT
SUT SILK 2 0 SH CR/8 (SUTURE) ×3 IMPLANT
SUT SILK 2 0 TIES 10X30 (SUTURE) ×3 IMPLANT
SUT SILK 3 0 SH CR/8 (SUTURE) ×3 IMPLANT
SUT SILK 3 0 TIES 10X30 (SUTURE) ×3 IMPLANT
SUT VIC AB 3-0 SH 8-18 (SUTURE) ×3 IMPLANT
SYS LAPSCP GELPORT 120MM (MISCELLANEOUS)
SYSTEM LAPSCP GELPORT 120MM (MISCELLANEOUS) IMPLANT
TIP INNERVISION DETACH 40FR (MISCELLANEOUS) IMPLANT
TIP INNERVISION DETACH 56FR (MISCELLANEOUS) IMPLANT
TIPS INNERVISION DETACH 40FR (MISCELLANEOUS)
TOWEL OR 17X24 6PK STRL BLUE (TOWEL DISPOSABLE) ×3 IMPLANT
TOWEL OR 17X26 10 PK STRL BLUE (TOWEL DISPOSABLE) ×3 IMPLANT
TRAY FOLEY CATH 14FRSI W/METER (CATHETERS) IMPLANT
TRAY LAPAROSCOPIC (CUSTOM PROCEDURE TRAY) ×3 IMPLANT
TROCAR BLADELESS 15MM (ENDOMECHANICALS) ×3 IMPLANT
TROCAR XCEL 12X100 BLDLESS (ENDOMECHANICALS) ×3 IMPLANT
TROCAR XCEL BLUNT TIP 100MML (ENDOMECHANICALS) IMPLANT
TROCAR XCEL NON-BLD 5MMX100MML (ENDOMECHANICALS) ×3 IMPLANT
TUBE CONNECTING 12X1/4 (SUCTIONS) ×6 IMPLANT
TUBING FILTER THERMOFLATOR (ELECTROSURGICAL) ×3 IMPLANT
XCEL BLADELESS TROCAR(ENDOPATH) ×3 IMPLANT
YANKAUER SUCT BULB TIP NO VENT (SUCTIONS) IMPLANT

## 2014-12-17 NOTE — Progress Notes (Signed)
[facility]  Called by Dr. Lucia Gaskins who is admitting pt overnight following removal of a lap band that was placed 11/12/14 and had been poorly tolerated His plan is to keep pt overnight and send him home in the AM He missed his dialysis today Post op renal panel is pending (labs are limited to istat K of 3.4 earlier today)  Outpt HD prescription: MWF [facility] 4.5 hours EDW 130 kg but last left out at 128.9 kg on Friday Opti 200 dialyzer 2K 3.5 Ca BFR 400 DFR 800 Access right AVF Heparin 6000 Calcitriol 0.5 mcg TIW Mircera 50 mcg Q2weeks (would be due next on 5/18) Venofer 50 mg QWednesday  Will write for his dialysis - will likely be after midnight when he gets HD due to 1 RN on call and 2 patients in front of him but will get his HD done before he goes home. No heparin since just post op.  If he ends up staying in the hospital longer than just overnight will do complete consult note at that time.  Jamal Maes, MD [facility] [phone] Pager 12/17/2014, 6:49 PM

## 2014-12-17 NOTE — Anesthesia Postprocedure Evaluation (Signed)
  Anesthesia Post-op Note  Patient: [name]  Procedure(s) Performed: Procedure(s): LAPAROSCOPIC REMOVAL OF GASTRIC BAND (N/A) ESOPHAGOGASTRODUODENOSCOPY (EGD) (N/A)  Patient Location: PACU  Anesthesia Type:General  Level of Consciousness: awake, alert  and oriented  Airway and Oxygen Therapy: Patient Spontanous Breathing and Patient connected to nasal cannula oxygen  Post-op Pain: mild  Post-op Assessment: Post-op Vital signs reviewed, Patient's Cardiovascular Status Stable, Respiratory Function Stable, Patent Airway and Pain level controlled  Post-op Vital Signs: stable  Last Vitals:  Filed Vitals:   12/17/14 1830  BP: 94/43  Pulse:   Temp:   Resp:     Complications: No apparent anesthesia complications

## 2014-12-17 NOTE — Anesthesia Procedure Notes (Signed)
Procedure Name: Intubation Date/Time: 12/17/2014 4:24 PM Performed by: Shirlyn Goltz Pre-anesthesia Checklist: Patient identified, Emergency Drugs available, Suction available and Patient being monitored Patient Re-evaluated:Patient Re-evaluated prior to inductionOxygen Delivery Method: Circle system utilized Preoxygenation: Pre-oxygenation with 100% oxygen Intubation Type: IV induction Ventilation: Mask ventilation without difficulty Laryngoscope Size: Glidescope and 4 Grade View: Grade II Tube type: Oral Tube size: 7.0 mm Number of attempts: 1 Airway Equipment and Method: Stylet and Video-laryngoscopy Placement Confirmation: positive ETCO2,  ETT inserted through vocal cords under direct vision and breath sounds checked- equal and bilateral Secured at: 22 cm Tube secured with: Tape Dental Injury: Teeth and Oropharynx as per pre-operative assessment

## 2014-12-17 NOTE — Anesthesia Preprocedure Evaluation (Addendum)
Anesthesia Evaluation  Patient identified by MRN, date of birth, ID band Patient awake    Reviewed: Allergy & Precautions, NPO status , Patient's Chart, lab work & pertinent test results  Airway Mallampati: II  TM Distance: <3 FB Neck ROM: Full    Dental no notable dental hx.    Pulmonary Current Smoker,  breath sounds clear to auscultation  Pulmonary exam normal       Cardiovascular hypertension, Normal cardiovascular examRhythm:Regular Rate:Normal     Neuro/Psych negative neurological ROS  negative psych ROS   GI/Hepatic negative GI ROS, Neg liver ROS,   Endo/Other  Morbid obesity  Renal/GU DialysisRenal disease  negative genitourinary   Musculoskeletal negative musculoskeletal ROS (+)   Abdominal   Peds negative pediatric ROS (+)  Hematology negative hematology ROS (+)   Anesthesia Other Findings   Reproductive/Obstetrics negative OB ROS                            Anesthesia Physical Anesthesia Plan  ASA: III  Anesthesia Plan: General   Post-op Pain Management:    Induction: Intravenous  Airway Management Planned: Oral ETT  Additional Equipment:   Intra-op Plan:   Post-operative Plan: Extubation in OR  Informed Consent: I have reviewed the patients History and Physical, chart, labs and discussed the procedure including the risks, benefits and alternatives for the proposed anesthesia with the patient or authorized representative who has indicated his/her understanding and acceptance.   Dental advisory given  Plan Discussed with: CRNA and Surgeon  Anesthesia Plan Comments:         Anesthesia Quick Evaluation

## 2014-12-17 NOTE — Transfer of Care (Signed)
Immediate Anesthesia Transfer of Care Note  Patient: [name]  Procedure(s) Performed: Procedure(s): LAPAROSCOPIC REMOVAL OF GASTRIC BAND (N/A) ESOPHAGOGASTRODUODENOSCOPY (EGD) (N/A)  Patient Location: PACU  Anesthesia Type:General  Level of Consciousness: awake, alert , oriented and patient cooperative  Airway & Oxygen Therapy: Patient Spontanous Breathing and Patient connected to face mask oxygen  Post-op Assessment: Report given to RN, Post -op Vital signs reviewed and stable, Patient moving all extremities and Patient moving all extremities X 4  Post vital signs: Reviewed and stable  Last Vitals:  Filed Vitals:   12/17/14 1130  BP: 130/90  Pulse: 88  Temp: 36.3 C  Resp: 18    Complications: No apparent anesthesia complications

## 2014-12-17 NOTE — Progress Notes (Signed)
Dr Linna Caprice at bedside. Patient given 51mcg of Precedex. Comfortable at this time, VSS

## 2014-12-17 NOTE — H&P (Signed)
[name] 12/13/2014 11:53 AM Location: [facility] Patient #: [id number] DOB: [birth year] Single / Language: [name] / Race: Black or African American Male  History of Present Illness  Patient words: postop n/v.  The patient is a 29 year old male who presents for a bariatric surgery evaluation. His PCP is Dr. Revonda Humphrey. He sees Dr. Lenna Sciara. [facility] for renal issues. He came by himself.   He had a lap band on 11/12/2014 - Dr. Keturah Macias. [name].  He has struggled since his surgery. He has been readmitted from 26 April to 30 April and from 4 May to 7 May for abdominal pain and nausea. I have removed all the fluid from his lap band. I did upper endoscopy on 07 Dec 2014 which showed the band in good position without obstruction. The patient has had 3 CT scans since lap band placement - 11/15/2014, 11/27/2014, and 11/29/2014. These have all been unremarkable for surgical complication.  Despite all the problems adjusted has had, his weight is only down 2 pounds to 289.  He had his CBC, CMP, lipase and lactic acid checked 12/12/2014 - all were in his normal range.  Unfortunately, [name] comes by himself. He has almost always come by himself, so it is hard to get some objective information from another source. He said that he was in severe pain yesterday, Dr. Jimmy Footman insisted he be seen today, he went to the [facility] ER last PM. But he was given pain meds and sent home.  I spent a long time (30+ minutes) going over options with [name]. He is at the point that he wants to remove the lap band. We have given him one month from surgery, externally everything looks good, but he is just miserable.   I would like to continue to give him 2 ot 4 more [name], but he says that he cannot do this.  So I will schedule lap band removal early next week.  Psych - [name] - okayed for surgery - 10/04/2014  Morbid obesity Weight - 291, BMI 47.07 History of weight loss  problems: The patient saw me in 2009 to consider weight loss surgery (lap band). But at that time he was not prepared to go through with the surgery. He has seen continued complications of being overweight and now is reconsidering surgery. He has tried diets Slim Fast and using the green Tea diet. He has some success with Slim fast where he lost about 30 pounds, but because of his renal failure there is some concern about phosphorus and electrolytes.  Past Medical History: 2. ESRD - Dr. Lenna Sciara. [facility] MWF 3. HTN 4. GERD 5. 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 Gerkin 6. Arthritis right knee - Dr. Marlou Sa. 7 On disability since 2004, but he says that he is looking for work.  SOCIAL and FAMILY HISTORY: Unmarried. Has no children. Lives with mother. On disability since 2004, though he says he may start looking for a job  Allergies (Ammie Eversole, LPN; 075-GRM 579FGE AM) No Known Drug Allergies03/24/2016  Medication History (Ammie Eversole, LPN; 075-GRM 579FGE AM) OxyCODONE HCl (5MG /5ML Solution, 5-10 Milliliter Oral every eight hours, as needed, Taken starting 11/21/2014) Active. Aspirin EC (325MG  Tablet DR, Oral) Active. Calcium Acetate (667MG  Capsule, Oral) Active. Tums 500 (1250MG  Tablet Chewable, Oral) Active. Docusate Sodium (100MG  Tablet, Oral) Active. Robaxin (500MG  Tablet, Oral) Active. PriLOSEC (20MG  Capsule DR, Oral) Active. Medications Reconciled  Review of Systems [name] H. Lucia Gaskins MD; 12/13/2014 12:15 PM) General Present-  Weight Gain. Not Present- Appetite Loss, Chills, Fatigue, Fever, Night Sweats and Weight Loss. Skin Not Present- Change in Wart/Mole, Dryness, Hives, Jaundice, New Lesions, Non-Healing Wounds, Rash and Ulcer. HEENT Not Present- Earache, Hearing Loss, Hoarseness, Nose Bleed, Oral Ulcers, Ringing in the Ears, Seasonal Allergies, Sinus Pain, Sore Throat, Visual Disturbances, Wears glasses/contact lenses and Yellow  Eyes. Respiratory Not Present- Bloody sputum, Chronic Cough, Difficulty Breathing, Snoring and Wheezing. Breast Not Present- Breast Mass, Breast Pain, Nipple Discharge and Skin Changes. Cardiovascular Not Present- Chest Pain, Difficulty Breathing Lying Down, Leg Cramps, Palpitations, Rapid Heart Rate, Shortness of Breath and Swelling of Extremities. Gastrointestinal Not Present- Abdominal Pain, Bloating, Bloody Stool, Change in Bowel Habits, Chronic diarrhea, Constipation, Difficulty Swallowing, Excessive gas, Gets full quickly at meals, Hemorrhoids, Indigestion, Nausea, Rectal Pain and Vomiting. Male Genitourinary Not Present- Blood in Urine, Change in Urinary Stream, Frequency, Impotence, Nocturia, Painful Urination, Urgency and Urine Leakage. Musculoskeletal Present- Joint Pain and Joint Stiffness. Not Present- Back Pain, Muscle Pain, Muscle Weakness and Swelling of Extremities. Neurological Present- Trouble walking. Not Present- Decreased Memory, Fainting, Headaches, Numbness, Seizures, Tingling, Tremor and Weakness. Psychiatric Not Present- Anxiety, Bipolar, Change in Sleep Pattern, Depression, Fearful and Frequent crying. Endocrine Not Present- Cold Intolerance, Excessive Hunger, Hair Changes, Heat Intolerance and New Diabetes. Hematology Not Present- Easy Bruising, Excessive bleeding, Gland problems, HIV and Persistent Infections.   Vitals (Ammie Eversole LPN; 075-GRM 579FGE AM) 12/13/2014 11:55 AM Weight: 289.4 lb Height: 65.75in Body Surface Area: 2.47 m Body Mass Index: 47.07 kg/m Temp.: 97.24F(Oral)  Pulse: 82 (Regular)  BP: 126/88 (Sitting, Left Arm, Standard)  Physical Exam: The physical exam findings are as follows: Note:General: Obese AA M who is alert. He feels poorly and is in pain. HEENT: Normal. Pupils equal.  Lungs: Clear to auscultation and symmetric breath sounds. Heart: RRR. No murmur or rub.  Abdomen: Soft. No mass. Normal bowel sounds. He is  more pear than apple, but has a lot of mid adbominal fat. He has no localized tenderness or guarding. His exam is really unremarkable.  Extremities: Good strength. Has a bandage over his shunt in his left arm. He also has a shunt in the right arm.  Assessment & Plan  1.  HISTORY OF LAPAROSCOPIC ADJUSTABLE GASTRIC BANDING (V45.86  Z98.84)  Story: Placement of AP standard Lap Band - 11/12/2014 - D. Ramsha Lonigro Impression: Has struggled with pain and nausea since placement of the lap band.  By imaging and endoscopy, I can find nothing wrong the lap band placement.  But for unknown reasons, he has not tolerated it.   He has made the decision to have the lap band removed. Exact cause of his nausea, abdominal and chest pain, remains unclear. I will try to schedule his surgery for early next week.  Current Plans   Schedule for Surgery for lap band removal  2  MORBID OBESITY WITH BMI OF 45.0-49.9, ADULT (278.01  E66.01) 3.  END-STAGE RENAL DISEASE ON HEMODIALYSIS (585.6  N18.6)  Impression: Followed by Dr. Lenna Sciara. [facility].  Gets his hemodialysis - MWF  Alphonsa Overall, MD, [facility] Pager: [phone] Office phone:  [phone]

## 2014-12-18 LAB — RENAL FUNCTION PANEL
ANION GAP: 16 — AB (ref 5–15)
Albumin: 3 g/dL — ABNORMAL LOW (ref 3.5–5.0)
BUN: 21 mg/dL — AB (ref 6–20)
CHLORIDE: 102 mmol/L (ref 101–111)
CO2: 22 mmol/L (ref 22–32)
Calcium: 7.7 mg/dL — ABNORMAL LOW (ref 8.9–10.3)
Creatinine, Ser: 15.77 mg/dL — ABNORMAL HIGH (ref 0.61–1.24)
GFR calc Af Amer: 4 mL/min — ABNORMAL LOW (ref >60)
GFR calc non Af Amer: 4 mL/min — ABNORMAL LOW (ref >60)
GLUCOSE: 72 mg/dL (ref 65–99)
POTASSIUM: 3.9 mmol/L (ref 3.5–5.1)
Phosphorus: 4.6 mg/dL (ref 2.5–4.6)
Sodium: 140 mmol/L (ref 135–145)

## 2014-12-18 LAB — CBC
HEMATOCRIT: 31.5 % — AB (ref 39.0–52.0)
Hemoglobin: 10.4 g/dL — ABNORMAL LOW (ref 13.0–17.0)
MCH: 29.6 pg (ref 26.0–34.0)
MCHC: 33 g/dL (ref 30.0–36.0)
MCV: 89.7 fL (ref 78.0–100.0)
Platelets: 166 10*3/uL (ref 150–400)
RBC: 3.51 MIL/uL — ABNORMAL LOW (ref 4.22–5.81)
RDW: 14.6 % (ref 11.5–15.5)
WBC: 7.6 10*3/uL (ref 4.0–10.5)

## 2014-12-18 LAB — HEPATITIS B SURFACE ANTIGEN: Hepatitis B Surface Ag: NEGATIVE

## 2014-12-18 MED ORDER — MORPHINE SULFATE 2 MG/ML IJ SOLN
INTRAMUSCULAR | Status: AC
  Filled 2014-12-18: qty 1

## 2014-12-18 MED ORDER — MORPHINE SULFATE 2 MG/ML IJ SOLN
INTRAMUSCULAR | Status: AC
  Administered 2014-12-18: 2 mg via INTRAVENOUS
  Filled 2014-12-18: qty 1

## 2014-12-18 NOTE — Progress Notes (Signed)
General Surgery Note  LOS: 1 day  POD -  1 Day Post-Op  Assessment/Plan: 1.  LAPAROSCOPIC REMOVAL OF GASTRIC BAND, ESOPHAGOGASTRODUODENOSCOPY (EGD) - 12/17/2014 - D. [employer] good from my standpoint, but the patient does not think that he is ready to go home.  Will keep till tomorrow after dialysis.  But he needs to get OOB and walk.  Discussed with nurse.  2. Lap band - Placed 11/12/2014 - D. Lucia Gaskins  He never tolerated the lap band and required 2 hospitalizations and several visits to the ER post op.  He made the decision to have the lap band removed. 3. Hiatal hernia - repaired at the same time as the lap bnad 4. ERSD - Dr. Lenna Sciara. [facility] - MWF  Had dialysis this AM.  Plans dialysis tomorrow and then home. 5. HTN 6. 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 Gerkin 8. Arthritis right knee - Dr. Marlou Sa  9.  DVT prophylaxis - SQ Heparin   Active Problems:   Abdominal pain  Subjective:  Complains of soreness all over.  But has lunch and is eating a little bit.  Had dialysis early this AM. Objective:   Filed Vitals:   12/18/14 1050  BP: 130/79  Pulse: 78  Temp: 98.6 F (37 C)  Resp: 20     Intake/Output from previous day:  05/16 0701 - 05/17 0700 In: 500 [I.V.:500] Out: -   Intake/Output this shift:  Total I/O In: 0  Out: 1200 [Other:1200]   Physical Exam:   General: Obese AA M who is alert.   HEENT: Normal. Pupils equal. .   Lungs: Clear.   Abdomen: Soft.  BS present.   Wound: Clean.   Lab Results:    Recent Labs  12/17/14 1226 12/18/14 0458  WBC  --  7.6  HGB 11.2* 10.4*  HCT 33.0* 31.5*  PLT  --  166    BMET   Recent Labs  12/17/14 2018 12/18/14 0458  NA 140 140  K 4.3 3.9  CL 105 102  CO2 18* 22  GLUCOSE 81 72  BUN 20 21*  CREATININE 14.82* 15.77*  CALCIUM 8.0* 7.7*    PT/INR  No results for input(s): LABPROT, INR in the last 72 hours.  ABG  No results for input(s): PHART, HCO3 in the last 72  hours.  Invalid input(s): PCO2, PO2   Studies/Results:  No results found.   Anti-infectives:   Anti-infectives    None      Alphonsa Overall, MD, FACS Pager: [phone] Surgery Office: [phone] 12/18/2014

## 2014-12-18 NOTE — Op Note (Signed)
NAMERIGGS, BRITNELL                ACCOUNT NO.:  [id number]  MEDICAL RECORD NO.:  [id number]  LOCATION:  6E28C                        FACILITY:  [facility]  PHYSICIAN:  Fenton Malling. Lucia Gaskins, M.D.  DATE OF BIRTH:  [birth year]  DATE OF PROCEDURE:  12/17/2014                              OPERATIVE REPORT   PREOPERATIVE DIAGNOSIS:  Persistent abdominal pain and nausea post lap band placement.  POSTOPERATIVE DIAGNOSIS:  Persistent abdominal pain and nausea post lap band placement.  PROCEDURE:  Removal of laparoscopic gastric band, esophagogastroduodenoscopy.  SURGEON:  Fenton Malling. Lucia Gaskins, M.D.  ENDOSCOPIST:  Isabel Caprice. Hassell Done, MD  ANESTHESIA:  General anesthesia, supervised by Glynda Jaeger, M.D.  I used 30 mL of 0.25% Marcaine with epinephrine.  ESTIMATED BLOOD LOSS:  Minimal.  DRAINS:  Drains left in were none.  SPECIMENS:  There was no specimen.  INDICATION FOR PROCEDURE:  Mr. Duff is a 29 year old African American male who sees Dr. Nolene Ebbs as his primary care doctor.  He is also followed by the [facility], Dr. Jeneen Rinks [facility].  He has end-stage renal disease with long-standing dialysis on Mondays, Wednesdays, Fridays.  He underwent a placement of a laparoscopic band by Dr. Alphonsa Overall on November 12, 2014 for morbid obesity.  English had completed our pre op bariatric program and seemed a reasonable candidate for weight loss sugery .  Unfortunately, since the band placed, he has been in almost chronic abdominal pain and nausea.    He has required at least 2 hospitalizations, once from the November 27, 2014, to December 01, 2014; second from Dec 05, 2014, to Dec 08, 2014, for pain and nausea.  He had at least 3 CAT scans, 1 on November 15, 2014, 1 on November 27, 2014, 1 on November 29, 2014, and I have done an upper endoscopy on him on Dec 07, 2014.  All of these exams have been negative for any specific abnormality or problems with his lap band.  I had a lengthy discussion with Maurizio in our office last  week about removing the band and he was to going to have this removed.  It is interesting, in the last 2 or 3 days, he is actually become almost pain-free with very minimal nausea, but he still wanted the band removed.  We had a fairly lengthy discussion again today in the pre op holding area. Unfortunately, he has come to the hospital by himself with no family.  DESCRIPTION OF PROCEDURE:  The patient was placed in supine position in room #2 at [facility].  He underwent a general endotracheal anesthetic, supervised by Dr. Roberts Gaudy.    A time-out was held and surgical checklist run.  I accessed his abdominal cavity through the left upper quadrant with a 5 mm OptiView.  I placed 4 additional trocars __________ subxiphoid to liver, a 15 mm in the right subcostal before the lap band removal, a 5 mm right paramedian just medial to the port is, left paramedian for the scope.  Abdominal exploration did reveal some adhesions to where the band was attached on the under surface where it came through the port, but these adhesions looked benign, it is just  early.  There was no other abnormality, infection, or inflammation. Gallbladder looked normal.  Stomach looked normal.  Liver looked normal.  I then cut down to the band and I was able to isolate the band.  I cut the band in 3 pieces which were each removed under direct visualization with 2 of the pieces being the band around the proximal stomach.  The third portion being attached to the port which was removed by removing the port.  At this point, Dr. Hassell Done did an upper endoscopy.  He passed the scope down into the stomach pouch, the esophagus, and stomach were unremarkable.  I cannot get into the duodenum.  Ryann did have some hypertrophied villi in the first and second portion of the duodenum.  These were nonobstructing.  They did not look malignant.  Photos were taken, but the staff are having trouble developing these photos at the time of this  dictation.  I then removed all the ports.  Then, I extended the incision in the right paramedian and took the reservoir out intact.  I then re-evaluated the abdominal cavity and looked through different ports.  There was no bleeding.  I then infiltrated each wound with 0.25% Marcaine using a total of 30 mL.  I closed the port site with 3-0 Vicryl in the deep and 4-0 Monocryl in the skin.  I then painted the wounds with Dermabond.  The patient tolerated the procedure well, was transported to the recovery room in good condition.  Sponge and needle counts were correct at the end of the case.   Fenton Malling. Lucia Gaskins, M.D., [facility], scribe for Epic   DHN/MEDQ  D:  12/17/2014  T:  12/18/2014  Job:  [id number]  cc:   Nolene Ebbs, M.D. James L. Deterding, M.D.

## 2014-12-18 NOTE — Progress Notes (Signed)
  [facility] Progress Note   Subjective: no c/o    Filed Vitals:   12/18/14 0730 12/18/14 0800 12/18/14 0830 12/18/14 0910  BP: 123/81 139/64 138/87 137/84  Pulse: 84 92 84 80  Temp:    98.5 F (36.9 C)  TempSrc:    Oral  Resp:    20  Height:      Weight:      SpO2:       Exam: Alert, no distress No jvd Chest clear bilat RRR no MRG Abd soft ntnd +bs Ext no edema Neuro is alert, ox 3  HD: [facility] on MWF  EDW 125kg Bath 2/2.25Ca 4hr 57min Heparin 1200 RFA AVF F200 Epogen 1200 Hectorol / Venofer none        Assessment: 1. S/p lap band removal 2. ESRD on HD 3. Vol stable 4. Anemia stable  Plan - HD today    Kelly Splinter MD  pager [phone]    cell [phone]  12/18/2014, 9:52 AM     Recent Labs Lab 12/14/14 1046 12/17/14 1226 12/17/14 2018 12/18/14 0458  NA 138 139 140 140  K 3.7 3.4* 4.3 3.9  CL 98*  --  105 102  CO2 22  --  18* 22  GLUCOSE 66 79 81 72  BUN 18  --  20 21*  CREATININE 14.30*  --  14.82* 15.77*  CALCIUM 8.5*  --  8.0* 7.7*  PHOS  --   --   --  4.6    Recent Labs Lab 12/12/14 1528 12/18/14 0458  AST 10*  --   ALT 13*  --   ALKPHOS 48  --   BILITOT 1.1  --   PROT 7.0  --   ALBUMIN 3.5 3.0*    Recent Labs Lab 12/12/14 1528 12/14/14 1046 12/17/14 1226 12/18/14 0458  WBC 7.7 7.5  --  7.6  NEUTROABS 4.9  --   --   --   HGB 11.9* 11.6* 11.2* 10.4*  HCT 35.6* 34.7* 33.0* 31.5*  MCV 90.6 90.6  --  89.7  PLT 212 214  --  166   . calcitRIOL  0.5 mcg Oral Q M,W,F-HD  . heparin  5,000 Units Subcutaneous 3 times per day  . morphine      . pantoprazole  40 mg Oral Daily  . sevelamer carbonate  2.4 g Oral TID WC   . sodium chloride     morphine injection, ondansetron, oxyCODONE, oxyCODONE, promethazine

## 2014-12-19 ENCOUNTER — Encounter (HOSPITAL_COMMUNITY): Payer: Self-pay | Admitting: Surgery

## 2014-12-19 LAB — BASIC METABOLIC PANEL
Anion gap: 9 (ref 5–15)
BUN: 13 mg/dL (ref 6–20)
CALCIUM: 9.1 mg/dL (ref 8.9–10.3)
CO2: 28 mmol/L (ref 22–32)
CREATININE: 10 mg/dL — AB (ref 0.61–1.24)
Chloride: 101 mmol/L (ref 101–111)
GFR, EST AFRICAN AMERICAN: 7 mL/min — AB (ref >60)
GFR, EST NON AFRICAN AMERICAN: 6 mL/min — AB (ref >60)
GLUCOSE: 88 mg/dL (ref 65–99)
Potassium: 3.5 mmol/L (ref 3.5–5.1)
Sodium: 138 mmol/L (ref 135–145)

## 2014-12-19 LAB — CBC
HCT: 30.9 % — ABNORMAL LOW (ref 39.0–52.0)
Hemoglobin: 10.1 g/dL — ABNORMAL LOW (ref 13.0–17.0)
MCH: 29.6 pg (ref 26.0–34.0)
MCHC: 32.7 g/dL (ref 30.0–36.0)
MCV: 90.6 fL (ref 78.0–100.0)
PLATELETS: 150 10*3/uL (ref 150–400)
RBC: 3.41 MIL/uL — AB (ref 4.22–5.81)
RDW: 14.6 % (ref 11.5–15.5)
WBC: 5.7 10*3/uL (ref 4.0–10.5)

## 2014-12-19 MED ORDER — MORPHINE SULFATE 2 MG/ML IJ SOLN
INTRAMUSCULAR | Status: AC
  Filled 2014-12-19: qty 1

## 2014-12-19 NOTE — Progress Notes (Signed)
Discharge instructions reviewed with pt; allowing time for questions. Pt verbalized understanding. IV removed without issue. Prescription given to pt by Dr. Lucia Gaskins. Pt to leave unit via wheelchair accompanied by Tech.

## 2014-12-19 NOTE — Progress Notes (Signed)
General Surgery Note  LOS: 2 days  POD -  2 Days Post-Op  Assessment/Plan: 1.  LAPAROSCOPIC REMOVAL OF GASTRIC BAND, ESOPHAGOGASTRODUODENOSCOPY (EGD) - 12/17/2014 - D. Edilia Ghuman  Had dialysis this AM.  Walking hall.  Ready to go home.  2. Lap band - Placed 11/12/2014 - D. Lucia Gaskins  He never tolerated the lap band and required 2 hospitalizations and several visits to the ER post op.  He made the decision to have the lap band removed. 3. Hiatal hernia - repaired at the same time as the lap bnad 4. ERSD - Dr. Lenna Sciara. [facility] - MWF  Had dialysis this AM.  Plans dialysis tomorrow and then home. 5. HTN 6. 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 Gerkin 8. Arthritis right knee - Dr. Marlou Sa  9.  DVT prophylaxis - SQ Heparin   Active Problems:   Abdominal pain  Subjective:  Less soreness.  Tolerating po's better. Objective:   Filed Vitals:   12/19/14 1215  BP: 137/84  Pulse: 79  Temp: 98.9 F (37.2 C)  Resp: 18     Intake/Output from previous day:  05/17 0701 - 05/18 0700 In: 360 [P.O.:360] Out: 1200   Intake/Output this shift:  Total I/O In: 0  Out: 1000 [Other:1000]   Physical Exam:   General: Obese AA M who is alert.   HEENT: Normal. Pupils equal. .   Lungs: Clear.   Abdomen: Soft.  BS present.   Wound: Clean.   Lab Results:     Recent Labs  12/18/14 0458 12/19/14 0803  WBC 7.6 5.7  HGB 10.4* 10.1*  HCT 31.5* 30.9*  PLT 166 150    BMET    Recent Labs  12/18/14 0458 12/19/14 0803  NA 140 138  K 3.9 3.5  CL 102 101  CO2 22 28  GLUCOSE 72 88  BUN 21* 13  CREATININE 15.77* 10.00*  CALCIUM 7.7* 9.1    PT/INR  No results for input(s): LABPROT, INR in the last 72 hours.  ABG  No results for input(s): PHART, HCO3 in the last 72 hours.  Invalid input(s): PCO2, PO2   Studies/Results:  No results found.   Anti-infectives:   Anti-infectives    None      Alphonsa Overall, MD, FACS Pager: [phone]  Surgery Office: [phone] 12/19/2014

## 2014-12-19 NOTE — Progress Notes (Addendum)
  [facility] Progress Note   Subjective: no c/o    Filed Vitals:   12/19/14 0725 12/19/14 0739 12/19/14 0800 12/19/14 0830  BP: 117/87 89/65 122/79 123/87  Pulse: 88 79 77 76  Temp: 98.5 F (36.9 C)     TempSrc: Oral     Resp: 20     Height:      Weight: 129.1 kg (284 lb 9.8 oz)     SpO2:       Exam: Alert, no distress No jvd Chest clear bilat RRR no MRG Abd soft ntnd +bs Ext no edema Neuro is alert, ox 3  HD: [facility] on MWF  EDW 125kg Bath 2/2.25Ca 4hr 55min Heparin 1200 RFA AVF F200 Epogen 1200 Hectorol / Venofer none        Assessment: 1. S/p lap band removal 2. ESRD on HD 3. Vol stable 4. Anemia stable 5. Pain - pt has low pain tolerance, pt agrees to stop IV pain medicine  Plan - HD today,UF to dry wt    Kelly Splinter MD  pager [phone]    cell [phone]  12/19/2014, 10:21 AM     Recent Labs Lab 12/17/14 2018 12/18/14 0458 12/19/14 0803  NA 140 140 138  K 4.3 3.9 3.5  CL 105 102 101  CO2 18* 22 28  GLUCOSE 81 72 88  BUN 20 21* 13  CREATININE 14.82* 15.77* 10.00*  CALCIUM 8.0* 7.7* 9.1  PHOS  --  4.6  --     Recent Labs Lab 12/12/14 1528 12/18/14 0458  AST 10*  --   ALT 13*  --   ALKPHOS 48  --   BILITOT 1.1  --   PROT 7.0  --   ALBUMIN 3.5 3.0*    Recent Labs Lab 12/12/14 1528 12/14/14 1046 12/17/14 1226 12/18/14 0458 12/19/14 0803  WBC 7.7 7.5  --  7.6 5.7  NEUTROABS 4.9  --   --   --   --   HGB 11.9* 11.6* 11.2* 10.4* 10.1*  HCT 35.6* 34.7* 33.0* 31.5* 30.9*  MCV 90.6 90.6  --  89.7 90.6  PLT 212 214  --  166 150   . calcitRIOL  0.5 mcg Oral Q M,W,F-HD  . heparin  5,000 Units Subcutaneous 3 times per day  . morphine      . pantoprazole  40 mg Oral Daily  . sevelamer carbonate  2.4 g Oral TID WC   . sodium chloride Stopped (12/18/14 1302)   morphine injection, ondansetron, oxyCODONE, oxyCODONE, promethazine

## 2014-12-19 NOTE — Care Management Note (Signed)
Case Management Note  Patient Details  Name: [name] MRN: [id number] Date of Birth: 1985/08/10  Subjective/Objective:                 CM following for progression and d/c planning.   Action/Plan: S/P surgery 12/18/2014 early am, plan to d/c to home today  Expected Discharge Date:       12/19/2014           Expected Discharge Plan:  Home/Self Care  In-House Referral:  NA  Discharge planning Services  NA  Post Acute Care Choice:  NA Choice offered to:  NA  DME Arranged:    DME Agency:     HH Arranged:    HH Agency:     Status of Service:  Completed, signed off  Medicare Important Message Given:  N/A - LOS <3 / Initial given by admissions Date Medicare IM Given:    Medicare IM give by:    Date Additional Medicare IM Given:    Additional Medicare Important Message give by:     If discussed at [place] of Stay Meetings, dates discussed:    Additional Comments:  Adron Bene, RN 12/19/2014, 11:21 AM

## 2014-12-19 NOTE — Discharge Summary (Signed)
Physician Discharge Summary  Patient ID:  [name]  MRN: [id number]  DOB/AGE: 09/15/1985 29 y.o.  Admit date: 12/17/2014 Discharge date: 12/19/2014  Discharge Diagnoses:  1.  Chronic abdominal pain and nausea - post lap band placement  2. Lap band - Placed 11/12/2014 - D. Lucia Gaskins He never tolerated the lap band and required 2 hospitalizations and several visits to the ER post op. He made the decision to have the lap band removed. 3. Hiatal hernia - repaired at the same time as the lap bnad 4. ERSD - Dr. Lenna Sciara. [facility] - MWF Had dialysis this AM. Plans dialysis tomorrow and then home. 5. HTN 6. 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 Gerkin 8. Arthritis right knee - Dr. Marlou Sa   Active Problems:   Abdominal pain  Operation: Procedure(s):  LAPAROSCOPIC REMOVAL OF GASTRIC BAND,  ESOPHAGOGASTRODUODENOSCOPY (EGD) on 12/17/2014 - D. Lucia Gaskins  Discharged Condition: good  Hospital Course: [name] is an 29 y.o. male whose primary care physician is Philis Fendt, MD and who was admitted 12/17/2014 with a chief complaint of abdominal pain and nausea.   He was brought to the operating room on 12/17/2014 and underwent LAPAROSCOPIC REMOVAL OF GASTRIC BAND, ESOPHAGOGASTRODUODENOSCOPY (EGD).  The first day after surgery, he was still sore and had limited po intake, so he was kept until today (POD #2).  He missed dialysis on Monday (5/16), so he had dialysis on Tuesday (5/17) and Wednesday (5/18).  He is doing much better and he is ready to go home.   The discharge instructions were reviewed with the patient.  Consults: nephrology  Significant Diagnostic Studies: Results for orders placed or performed during the hospital encounter of [id number]  Basic metabolic panel  Result Value Ref Range   Sodium 140 135 - 145 mmol/L   Potassium 4.3 3.5 - 5.1 mmol/L   Chloride 105 101 - 111 mmol/L   CO2 18 (L) 22 - 32 mmol/L   Glucose, Bld 81  65 - 99 mg/dL   BUN 20 6 - 20 mg/dL   Creatinine, Ser 14.82 (H) 0.61 - 1.24 mg/dL   Calcium 8.0 (L) 8.9 - 10.3 mg/dL   GFR calc non Af Amer 4 (L) >60 mL/min   GFR calc Af Amer 4 (L) >60 mL/min   Anion gap 17 (H) 5 - 15  Renal function panel  Result Value Ref Range   Sodium 140 135 - 145 mmol/L   Potassium 3.9 3.5 - 5.1 mmol/L   Chloride 102 101 - 111 mmol/L   CO2 22 22 - 32 mmol/L   Glucose, Bld 72 65 - 99 mg/dL   BUN 21 (H) 6 - 20 mg/dL   Creatinine, Ser 15.77 (H) 0.61 - 1.24 mg/dL   Calcium 7.7 (L) 8.9 - 10.3 mg/dL   Phosphorus 4.6 2.5 - 4.6 mg/dL   Albumin 3.0 (L) 3.5 - 5.0 g/dL   GFR calc non Af Amer 4 (L) >60 mL/min   GFR calc Af Amer 4 (L) >60 mL/min   Anion gap 16 (H) 5 - 15  CBC  Result Value Ref Range   WBC 7.6 4.0 - 10.5 K/uL   RBC 3.51 (L) 4.22 - 5.81 MIL/uL   Hemoglobin 10.4 (L) 13.0 - 17.0 g/dL   HCT 31.5 (L) 39.0 - 52.0 %   MCV 89.7 78.0 - 100.0 fL   MCH 29.6 26.0 - 34.0 pg   MCHC 33.0 30.0 - 36.0 g/dL   RDW 14.6 11.5 - 15.5 %  Platelets 166 150 - 400 K/uL  Hepatitis B surface antigen  Result Value Ref Range   Hepatitis B Surface Ag NEGATIVE NEGATIVE  CBC  Result Value Ref Range   WBC 5.7 4.0 - 10.5 K/uL   RBC 3.41 (L) 4.22 - 5.81 MIL/uL   Hemoglobin 10.1 (L) 13.0 - 17.0 g/dL   HCT 30.9 (L) 39.0 - 52.0 %   MCV 90.6 78.0 - 100.0 fL   MCH 29.6 26.0 - 34.0 pg   MCHC 32.7 30.0 - 36.0 g/dL   RDW 14.6 11.5 - 15.5 %   Platelets 150 150 - 400 K/uL  Basic metabolic panel  Result Value Ref Range   Sodium 138 135 - 145 mmol/L   Potassium 3.5 3.5 - 5.1 mmol/L   Chloride 101 101 - 111 mmol/L   CO2 28 22 - 32 mmol/L   Glucose, Bld 88 65 - 99 mg/dL   BUN 13 6 - 20 mg/dL   Creatinine, Ser 10.00 (H) 0.61 - 1.24 mg/dL   Calcium 9.1 8.9 - 10.3 mg/dL   GFR calc non Af Amer 6 (L) >60 mL/min   GFR calc Af Amer 7 (L) >60 mL/min   Anion gap 9 5 - 15  I-STAT 4, (NA,K, GLUC, HGB,HCT)  Result Value Ref Range   Sodium 139 135 - 145 mmol/L   Potassium 3.4 (L) 3.5 -  5.1 mmol/L   Glucose, Bld 79 65 - 99 mg/dL   HCT 33.0 (L) 39.0 - 52.0 %   Hemoglobin 11.2 (L) 13.0 - 17.0 g/dL    Dg Chest 2 View  12/05/2014   CLINICAL DATA:  Chest pain for 1 day.  EXAM: CHEST  2 VIEW  COMPARISON:  PA and lateral chest 04/09/2014.  FINDINGS: Heart size and mediastinal contours are within normal limits. Both lungs are clear. Visualized skeletal structures are unremarkable.  IMPRESSION: Negative chest.   Electronically Signed   By: Inge Rise M.D.   On: 12/05/2014 20:36   Ct Abdomen W Contrast  11/29/2014   CLINICAL DATA:  29 year old status post lap band surgery. Recent re- admission for nausea vomiting and increased abdominal pain.  EXAM: CT ABDOMEN WITH CONTRAST   IMPRESSION: 1. No acute findings identified within the abdomen. 2. Bilateral end-stage kidneys. 3. Stable position of gastric band.   Electronically Signed   By: Kerby Moors M.D.   On: 11/29/2014 10:19   Ct Abdomen Pelvis W Contrast  12/10/2014   CLINICAL DATA:  Mid abdominal pain. Dialysis for 12 years. Recent gastric banding on 11/12/2014.  EXAM: CT ABDOMEN AND PELVIS WITH CONTRAST  IMPRESSION: No acute process demonstrated in the abdomen or pelvis to explain symptoms. Appendix is normal. Polycystic renal disease. Gastric band appears in appropriate position.   Electronically Signed   By: Lucienne Capers M.D.   On: 12/10/2014 02:37   Ct Abdomen Pelvis W Contrast  11/25/2014   CLINICAL DATA:  Left upper and lower quadrant abdominal pain associated with nausea and vomiting since yesterday. Prior laparoscopic banding. Current history of end- stage renal disease on hemodialysis.  EXAM: CT ABDOMEN AND PELVIS WITH CONTRAST    IMPRESSION: 1. No acute abnormalities involving the abdomen or pelvis. 2. No visible complications involving the laparoscopic band or its components. 3. Mild diffuse hepatic steatosis. 4. Increased attenuation in the subcutaneous fat of the right upper abdominal wall, present on the examination  10 days ago and unchanged. Does the patient take subcutaneous injections? If not, focal fat necrosis is  suspected.   Electronically Signed   By: Evangeline Dakin M.D.   On: 11/25/2014 16:54    Discharge Exam:  Filed Vitals:   12/19/14 1309  BP: 142/76  Pulse: 97  Temp: 98.8 F (37.1 C)  Resp: 18   General: Obese AA F who is alert.  Lungs: Clear to auscultation and symmetric breath sounds. Heart:  RRR. No murmur or rub. Abdomen: Soft. Normal bowel sounds.  Incisions look good.  Discharge Medications:     Medication List    TAKE these medications        flintstones complete 60 MG chewable tablet  Chew 1 tablet by mouth daily.     omeprazole 20 MG capsule  Commonly known as:  PRILOSEC  Take 20 mg by mouth daily as needed (for heartburn).     ondansetron 4 MG disintegrating tablet  Commonly known as:  ZOFRAN ODT  Take 1 tablet (4 mg total) by mouth every 8 (eight) hours as needed for nausea or vomiting.     oxyCODONE 5 MG immediate release tablet  Commonly known as:  Oxy IR/ROXICODONE  Take 1 tablet (5 mg total) by mouth every 3 (three) hours as needed for moderate pain.     oxyCODONE 5 MG/5ML solution  Commonly known as:  ROXICODONE  Take 5 mLs (5 mg total) by mouth every 4 (four) hours as needed for moderate pain or severe pain.     promethazine 25 MG tablet  Commonly known as:  PHENERGAN  Take 25 mg by mouth every 6 (six) hours as needed for nausea or vomiting.     sevelamer carbonate 2.4 G Pack  Commonly known as:  RENVELA  Take 2.4 g by mouth 3 (three) times daily with meals.        Disposition: 01-Home or Self Care      Discharge Instructions    Diet - low sodium heart healthy    Complete by:  As directed      Increase activity slowly    Complete by:  As directed           Activity:  Driving - May drive in 2 or 3 days, as long as you are off the pain meds   Lifting - No lifting more than 15 pounds x 7 days, then no limit  Wound Care:   May  shower  Diet:  As tolerated.  Follow up appointment:  Call Dr. Pollie Friar office [facility]) at [phone] for an appointment in 3 to 4 [place].  Medications and dosages:  Resume your home medications.  You have a prescription for:  Oxycodone Elixir.    Signed: Alphonsa Overall, M.D., [facility] Surgery Office:  [phone]  12/19/2014, 1:36 PM

## 2014-12-19 NOTE — Discharge Instructions (Signed)
[facility] - DISCHARGE INSTRUCTIONS TO PATIENT  Activity:  Driving - May drive in 2 or 3 days, as long as you are off the pain meds   Lifting - No lifting more than 15 pounds x 7 days, then no limit  Wound Care:   May shower  Diet:  As tolerated.  Follow up appointment:  Call Dr. Pollie Friar office [facility]) at [phone] for an appointment in 3 to 4 [place].  Medications and dosages:  Resume your home medications.  You have a prescription for:  Oxycodone Elixir.  Call Dr. Lucia Gaskins or his office  [phone]) if you have:  Temperature greater than 100.4,  Persistent nausea and vomiting,  Severe uncontrolled pain,  Redness, tenderness, or signs of infection (pain, swelling, redness, odor or green/yellow discharge around the site),  Difficulty breathing, headache or visual disturbances,  Any other questions or concerns you may have after discharge.  In an emergency, call 911 or go to an Emergency Department at a nearby hospital.

## 2014-12-24 ENCOUNTER — Encounter (HOSPITAL_COMMUNITY): Payer: Self-pay | Admitting: Surgery

## 2014-12-27 ENCOUNTER — Emergency Department (HOSPITAL_COMMUNITY)
Admission: EM | Admit: 2014-12-27 | Discharge: 2014-12-27 | Disposition: A | Discharge status: Home / Self Care | Payer: Medicare Other | Attending: Emergency Medicine | Admitting: Emergency Medicine

## 2014-12-27 ENCOUNTER — Emergency Department (HOSPITAL_COMMUNITY): Payer: Medicare Other

## 2014-12-27 ENCOUNTER — Encounter (HOSPITAL_COMMUNITY): Payer: Self-pay | Admitting: Emergency Medicine

## 2014-12-27 DIAGNOSIS — Z9889 Other specified postprocedural states: Secondary | ICD-10-CM | POA: Diagnosis not present

## 2014-12-27 DIAGNOSIS — I120 Hypertensive chronic kidney disease with stage 5 chronic kidney disease or end stage renal disease: Secondary | ICD-10-CM | POA: Diagnosis not present

## 2014-12-27 DIAGNOSIS — Z992 Dependence on renal dialysis: Secondary | ICD-10-CM | POA: Diagnosis not present

## 2014-12-27 DIAGNOSIS — R1013 Epigastric pain: Secondary | ICD-10-CM | POA: Diagnosis present

## 2014-12-27 DIAGNOSIS — Z720 Tobacco use: Secondary | ICD-10-CM | POA: Diagnosis not present

## 2014-12-27 DIAGNOSIS — R112 Nausea with vomiting, unspecified: Secondary | ICD-10-CM | POA: Diagnosis not present

## 2014-12-27 DIAGNOSIS — Z9884 Bariatric surgery status: Secondary | ICD-10-CM | POA: Insufficient documentation

## 2014-12-27 DIAGNOSIS — N186 End stage renal disease: Secondary | ICD-10-CM | POA: Insufficient documentation

## 2014-12-27 DIAGNOSIS — Z8639 Personal history of other endocrine, nutritional and metabolic disease: Secondary | ICD-10-CM | POA: Diagnosis not present

## 2014-12-27 DIAGNOSIS — M1990 Unspecified osteoarthritis, unspecified site: Secondary | ICD-10-CM | POA: Diagnosis not present

## 2014-12-27 DIAGNOSIS — Z79899 Other long term (current) drug therapy: Secondary | ICD-10-CM | POA: Insufficient documentation

## 2014-12-27 DIAGNOSIS — K219 Gastro-esophageal reflux disease without esophagitis: Secondary | ICD-10-CM | POA: Insufficient documentation

## 2014-12-27 LAB — I-STAT CHEM 8, ED
BUN: 28 mg/dL — AB (ref 6–20)
CHLORIDE: 103 mmol/L (ref 101–111)
Calcium, Ion: 1.23 mmol/L (ref 1.12–1.23)
Creatinine, Ser: 7.6 mg/dL — ABNORMAL HIGH (ref 0.61–1.24)
GLUCOSE: 118 mg/dL — AB (ref 65–99)
HEMATOCRIT: 32 % — AB (ref 39.0–52.0)
HEMOGLOBIN: 10.9 g/dL — AB (ref 13.0–17.0)
POTASSIUM: 4 mmol/L (ref 3.5–5.1)
SODIUM: 139 mmol/L (ref 135–145)
TCO2: 26 mmol/L (ref 0–100)

## 2014-12-27 LAB — HEPATIC FUNCTION PANEL
ALT: 23 U/L (ref 17–63)
AST: 20 U/L (ref 15–41)
Albumin: 3.4 g/dL — ABNORMAL LOW (ref 3.5–5.0)
Alkaline Phosphatase: 43 U/L (ref 38–126)
BILIRUBIN INDIRECT: 0.6 mg/dL (ref 0.3–0.9)
BILIRUBIN TOTAL: 0.9 mg/dL (ref 0.3–1.2)
Bilirubin, Direct: 0.3 mg/dL (ref 0.1–0.5)
Total Protein: 6.8 g/dL (ref 6.5–8.1)

## 2014-12-27 LAB — I-STAT TROPONIN, ED: Troponin i, poc: 0 ng/mL (ref 0.00–0.08)

## 2014-12-27 LAB — CBC WITH DIFFERENTIAL/PLATELET
Basophils Absolute: 0.1 10*3/uL (ref 0.0–0.1)
Basophils Relative: 1 % (ref 0–1)
EOS PCT: 0 % (ref 0–5)
Eosinophils Absolute: 0 10*3/uL (ref 0.0–0.7)
HCT: 29.1 % — ABNORMAL LOW (ref 39.0–52.0)
HEMOGLOBIN: 9.7 g/dL — AB (ref 13.0–17.0)
Lymphocytes Relative: 13 % (ref 12–46)
Lymphs Abs: 1 10*3/uL (ref 0.7–4.0)
MCH: 30 pg (ref 26.0–34.0)
MCHC: 33.3 g/dL (ref 30.0–36.0)
MCV: 90.1 fL (ref 78.0–100.0)
Monocytes Absolute: 0.7 10*3/uL (ref 0.1–1.0)
Monocytes Relative: 10 % (ref 3–12)
NEUTROS PCT: 76 % (ref 43–77)
Neutro Abs: 5.6 10*3/uL (ref 1.7–7.7)
Platelets: 191 10*3/uL (ref 150–400)
RBC: 3.23 MIL/uL — ABNORMAL LOW (ref 4.22–5.81)
RDW: 14.1 % (ref 11.5–15.5)
WBC: 7.4 10*3/uL (ref 4.0–10.5)

## 2014-12-27 LAB — LIPASE, BLOOD: LIPASE: 23 U/L (ref 22–51)

## 2014-12-27 LAB — CBG MONITORING, ED: Glucose-Capillary: 120 mg/dL — ABNORMAL HIGH (ref 65–99)

## 2014-12-27 MED ORDER — IOHEXOL 300 MG/ML  SOLN
25.0000 mL | Freq: Once | INTRAMUSCULAR | Status: AC | PRN
  Administered 2014-12-27: 25 mL via ORAL

## 2014-12-27 MED ORDER — MORPHINE SULFATE 4 MG/ML IJ SOLN
6.0000 mg | Freq: Once | INTRAMUSCULAR | Status: AC
  Administered 2014-12-27: 6 mg via INTRAVENOUS
  Filled 2014-12-27: qty 2

## 2014-12-27 MED ORDER — GI COCKTAIL ~~LOC~~
30.0000 mL | Freq: Once | ORAL | Status: AC
  Administered 2014-12-27: 30 mL via ORAL
  Filled 2014-12-27: qty 30

## 2014-12-27 MED ORDER — PERCOCET 5-325 MG PO TABS: 1.0000 | ORAL_TABLET | Freq: Four times a day (QID) | ORAL | Status: DC | PRN

## 2014-12-27 MED ORDER — ONDANSETRON HCL 4 MG/2ML IJ SOLN
4.0000 mg | Freq: Once | INTRAMUSCULAR | Status: AC
  Administered 2014-12-27: 4 mg via INTRAVENOUS
  Filled 2014-12-27: qty 2

## 2014-12-27 MED ORDER — PROMETHAZINE HCL 25 MG PO TABS: 25.0000 mg | ORAL_TABLET | Freq: Three times a day (TID) | ORAL | Status: DC | PRN

## 2014-12-27 MED ORDER — PANTOPRAZOLE SODIUM 40 MG IV SOLR
40.0000 mg | Freq: Once | INTRAVENOUS | Status: AC
  Administered 2014-12-27: 40 mg via INTRAVENOUS
  Filled 2014-12-27: qty 40

## 2014-12-27 MED ORDER — IOHEXOL 300 MG/ML  SOLN
100.0000 mL | Freq: Once | INTRAMUSCULAR | Status: AC | PRN
  Administered 2014-12-27: 100 mL via INTRAVENOUS

## 2014-12-27 MED ORDER — HYDROMORPHONE HCL 1 MG/ML IJ SOLN
1.0000 mg | Freq: Once | INTRAMUSCULAR | Status: AC
  Administered 2014-12-27: 1 mg via INTRAVENOUS
  Filled 2014-12-27: qty 1

## 2014-12-27 NOTE — ED Notes (Signed)
Pt verbalized understanding of d/c instructions and has no further questions. Pt in no acute distress upon d/c

## 2014-12-27 NOTE — Discharge Instructions (Signed)
Return here as needed.  Follow-up with your surgeon and primary care doctor

## 2014-12-27 NOTE — ED Notes (Signed)
Pt. Is going to CT. 

## 2014-12-27 NOTE — ED Provider Notes (Signed)
CSN: [id number]     Arrival date & time 12/27/14  1219 History   First MD Initiated Contact with Patient 12/27/14 1227     Chief Complaint  Patient presents with  . Abdominal Pain  . Emesis     (Consider location/radiation/quality/duration/timing/severity/associated sxs/prior Treatment) HPI   29 year old morbidly obese male with history of end-stage renal disease currently on Monday Wednesday Friday dialysis, history of lap band surgery nearly 2 months ago by Dr. Lucia Gaskins and history of GERD who is well-known to the ER presenting for evaluation of epigastric abdominal pain. Patient reports for the past 24 hours he has been having persistent epigastric pain which she described as a sharp sensation with occasional shooting pain. States he has been feeling nauseous and has had 5 bouts of nonbloody nonbilious vomit. Pain is persistent, unable to tolerate any by mouth. He has tried taking Tums and Prilosec with some improvement. He rates pain as 10 out of 10. He denies fever, chills, shortness of breath, productive cough, back pain, or rash. He does not make urine. He did finish his dialysis yesterday. He was seen in the ER 2 [place] ago for similar complaint, and has seen multiple times for the same complaint and has had multiple imaging studies including CT scan with the last CT scan that shows no acute finding.     Past Medical History  Diagnosis Date  . Hyperparathyroidism   . Morbid obesity   . Arthritis   . Complication of anesthesia     A little while to wake up after knee surgery in 2008  . Family history of anesthesia complication     mom slow to wake up  . Headache(784.0)     occasionally  . Dizziness     when coming off of dialysis  . Joint pain   . Joint swelling   . GERD (gastroesophageal reflux disease)     takes Omeprazole as needed  . Hypertension   . Renal disorder     MWF and goes to [facility]  . Renal insufficiency   . PONV (postoperative nausea and vomiting)     Past Surgical History  Procedure Laterality Date  . Av fistula placement Bilateral     Uses Right arm  . Esophagogastroduodenoscopy endoscopy    . Knee arthroscopy Left 2007  . Parathyroidectomy N/A 03/30/2013    Procedure: TOTAL PARATHYROIDECTOMY WITH AUTOTRANSPLANT;  Surgeon: Earnstine Regal, MD;  Location: [facility];  Service: General;  Laterality: N/A;  Autotransplant to left lower arm.  . Total knee arthroplasty Right 03/27/2014    DR Marlou Sa  . Total knee arthroplasty Right 03/27/2014    Procedure: UNICOMPARTMENTAL ARTHROPLASTY;  Surgeon: Meredith Pel, MD;  Location: [facility];  Service: Orthopedics;  Laterality: Right;  . Breath tek h pylori N/A 05/15/2014    Procedure: BREATH TEK H PYLORI;  Surgeon: Alphonsa Overall, MD;  Location: [facility];  Service: General;  Laterality: N/A;  . Laparoscopic gastric banding N/A 11/12/2014    Procedure: LAPAROSCOPIC GASTRIC BANDING;  Surgeon: Alphonsa Overall, MD;  Location: [facility];  Service: General;  Laterality: N/A;  . Esophagogastroduodenoscopy N/A 12/07/2014    Procedure: ESOPHAGOGASTRODUODENOSCOPY (EGD);  Surgeon: Alphonsa Overall, MD;  Location: [facility];  Service: General;  Laterality: N/A;  . Esophagogastroduodenoscopy N/A 12/17/2014    Procedure: ESOPHAGOGASTRODUODENOSCOPY (EGD);  Surgeon: Alphonsa Overall, MD;  Location: [facility];  Service: General;  Laterality: N/A;   No family history on file. History  Substance Use Topics  . Smoking  status: Current Some Day Smoker -- 0.20 packs/day for 13 years    Types: Cigarettes  . Smokeless tobacco: Never Used     Comment: 2 cigs a day  . Alcohol Use: No    Review of Systems  All other systems reviewed and are negative.     Allergies  Review of patient's allergies indicates no known allergies.  Home Medications   Prior to Admission medications   Medication Sig Start Date End Date Taking? Authorizing Provider  flintstones complete (FLINTSTONES) 60 MG chewable tablet Chew 1 tablet by mouth daily.     Historical Provider, MD  omeprazole (PRILOSEC) 20 MG capsule Take 20 mg by mouth daily as needed (for heartburn).     Historical Provider, MD  ondansetron (ZOFRAN ODT) 4 MG disintegrating tablet Take 1 tablet (4 mg total) by mouth every 8 (eight) hours as needed for nausea or vomiting. 11/25/14   Waynetta Pean, [name]  oxyCODONE (OXY IR/ROXICODONE) 5 MG immediate release tablet Take 1 tablet (5 mg total) by mouth every 3 (three) hours as needed for moderate pain. Patient not taking: Reported on 12/13/2014 12/01/14   Velvet Bathe, MD  oxyCODONE (ROXICODONE) 5 MG/5ML solution Take 5 mLs (5 mg total) by mouth every 4 (four) hours as needed for moderate pain or severe pain. 12/08/14   Saverio Danker, [name]  promethazine (PHENERGAN) 25 MG tablet Take 25 mg by mouth every 6 (six) hours as needed for nausea or vomiting.    Historical Provider, MD  sevelamer carbonate (RENVELA) 2.4 G PACK Take 2.4 g by mouth 3 (three) times daily with meals.    Historical Provider, MD   BP 122/65 mmHg  Pulse 88  Temp(Src) 98.8 F (37.1 C) (Oral)  Resp 20  Ht 5\' 5"  (1.651 m)  Wt 283 lb 4.7 oz (128.5 kg)  BMI 47.14 kg/m2  SpO2 97% Physical Exam  Constitutional: He appears well-developed and well-nourished. No distress.  Morbidly obese male Awake, alert, nontoxic appearance  HENT:  Head: Atraumatic.  Eyes: Conjunctivae are normal. Right eye exhibits no discharge. Left eye exhibits no discharge.  Neck: Normal range of motion. Neck supple.  Cardiovascular: Normal rate and regular rhythm.   Pulmonary/Chest: Effort normal. No respiratory distress. He exhibits no tenderness.  Abdominal: Soft. There is tenderness (Epigastric tenderness on palpation without guarding or rebound tenderness). There is no rebound.  Musculoskeletal: He exhibits no tenderness.  ROM appears intact, no obvious focal weakness  Neurological: He is alert.  Skin: Skin is warm and dry. No rash noted.  Functional AV fistula to left upper extremities with  palpable thrills  Psychiatric: He has a normal mood and affect.  Nursing note and vitals reviewed.   ED Course  Procedures (including critical care time)  Patient with recurrent epigastric abdominal pain status post lap band procedure performed nearly 2 months ago. He has been seen multiple times in the ED for similar complaint. He does not have a surgical abdomen on exam. Workup initiated.  2:44 PM Patient did not tolerate lap band procedure and subsequently had his lap band removed on 12/19/2014. His labs are reassuring and at baseline. He has a nonsurgical abdomen. Low suspicion of perforation.  Will obtain acute abd series.  Care discussed with Dr. Canary Brim.  Anticipate patient can follow-up with Dr. Lucia Gaskins for further management of his abdominal pain.   Pt currently sleeping soundly, in NAD.  3:45 PM Acute abdominal series with small bowel dilatation consistent with early or partial SBO vs. Focal ileus.  Will obtain abd/pelvis CT for further evaluation.  Care discussed with oncoming provider who will f/u with CT scan.  Pt report LBM was earlier today.  Able to pass flatus.    Labs Review Labs Reviewed  CBC WITH DIFFERENTIAL/PLATELET - Abnormal; Notable for the following:    RBC 3.23 (*)    Hemoglobin 9.7 (*)    HCT 29.1 (*)    All other components within normal limits  HEPATIC FUNCTION PANEL - Abnormal; Notable for the following:    Albumin 3.4 (*)    All other components within normal limits  I-STAT CHEM 8, ED - Abnormal; Notable for the following:    BUN 28 (*)    Creatinine, Ser 7.60 (*)    Glucose, Bld 118 (*)    Hemoglobin 10.9 (*)    HCT 32.0 (*)    All other components within normal limits  CBG MONITORING, ED - Abnormal; Notable for the following:    Glucose-Capillary 120 (*)    All other components within normal limits  LIPASE, BLOOD  I-STAT TROPOININ, ED    Imaging Review Dg Abd Acute W/chest  12/27/2014   CLINICAL DATA:  Lap band inserted 1 month ago and  removed 8 days ago. Waves of sharp abdominal pain. Left upper quadrant pain started on Sunday. Epigastric pain began yesterday. Worse with movement.  EXAM: DG ABDOMEN ACUTE W/ 1V CHEST  COMPARISON:  CT of the abdomen and pelvis on 12/10/2014, chest x-ray on 12/05/2014  FINDINGS: Heart size is mildly enlarged. There are changes of mild interstitial edema. There are no focal consolidations.  No free intraperitoneal air. There is mild dilatation of central small bowel loops. Colonic loops are not dilated. Visualized osseous structures have a normal appearance. No evidence for organomegaly.  IMPRESSION: 1. Mild cardiomegaly and interstitial edema. 2. Small bowel dilatation consistent with early or partial small bowel obstruction versus focal ileus. 3. No free intraperitoneal air.   Electronically Signed   By: Nolon Nations M.D.   On: 12/27/2014 15:26     EKG Interpretation None      Date: 12/27/2014  Rate: 81  Rhythm: normal sinus rhythm  QRS Axis: normal  Intervals: normal  ST/T Wave abnormalities: normal  Conduction Disutrbances: none  Narrative Interpretation:   Old EKG Reviewed: No significant changes noted     MDM   Final diagnoses:  Epigastric pain    BP 143/84 mmHg  Pulse 74  Temp(Src) 98.8 F (37.1 C) (Oral)  Resp 19  Ht 5\' 5"  (1.651 m)  Wt 283 lb 4.7 oz (128.5 kg)  BMI 47.14 kg/m2  SpO2 99%     Domenic Moras, [name] 12/27/14 1628  Alfonzo Beers, MD 12/28/14 [phone]

## 2014-12-27 NOTE — ED Notes (Signed)
Per EMS: pt from home for eval of gen. abd pain that started after lapband surgery x1.5 months ago, pt had lapband removed 2 [place] ago due to n/v and abd pain but states symptoms have gotten worse since having it removed. Pt states emesis x24 hours and pain above incision sites that radiate to lower abd. Pt states ran out of pain meds and has not had them x3 days. Pt is dialysis pt, with last full treatment on 12/26/14. nad noted per EMS.

## 2014-12-27 NOTE — ED Provider Notes (Signed)
Tolerated oral fluids.  He still having a little bit of discomfort.  Plain CT scan did not show any significant abnormality  Dalia Heading, [name] 12/27/14 2107  Tanna Furry, MD 01/04/15 1330

## 2014-12-27 NOTE — ED Notes (Signed)
Pt Is back from CT   

## 2014-12-28 ENCOUNTER — Encounter (HOSPITAL_COMMUNITY): Payer: Self-pay | Admitting: *Deleted

## 2014-12-28 DIAGNOSIS — R1011 Right upper quadrant pain: Secondary | ICD-10-CM | POA: Diagnosis not present

## 2014-12-28 DIAGNOSIS — R079 Chest pain, unspecified: Secondary | ICD-10-CM | POA: Insufficient documentation

## 2014-12-28 DIAGNOSIS — Z8639 Personal history of other endocrine, nutritional and metabolic disease: Secondary | ICD-10-CM | POA: Diagnosis not present

## 2014-12-28 DIAGNOSIS — I120 Hypertensive chronic kidney disease with stage 5 chronic kidney disease or end stage renal disease: Secondary | ICD-10-CM | POA: Insufficient documentation

## 2014-12-28 DIAGNOSIS — R1013 Epigastric pain: Secondary | ICD-10-CM | POA: Diagnosis present

## 2014-12-28 DIAGNOSIS — R112 Nausea with vomiting, unspecified: Secondary | ICD-10-CM | POA: Diagnosis not present

## 2014-12-28 DIAGNOSIS — Z992 Dependence on renal dialysis: Secondary | ICD-10-CM | POA: Insufficient documentation

## 2014-12-28 DIAGNOSIS — M1990 Unspecified osteoarthritis, unspecified site: Secondary | ICD-10-CM | POA: Insufficient documentation

## 2014-12-28 DIAGNOSIS — Z79899 Other long term (current) drug therapy: Secondary | ICD-10-CM | POA: Diagnosis not present

## 2014-12-28 DIAGNOSIS — R1012 Left upper quadrant pain: Secondary | ICD-10-CM | POA: Diagnosis not present

## 2014-12-28 DIAGNOSIS — N186 End stage renal disease: Secondary | ICD-10-CM | POA: Diagnosis not present

## 2014-12-28 DIAGNOSIS — G8929 Other chronic pain: Secondary | ICD-10-CM | POA: Diagnosis not present

## 2014-12-28 DIAGNOSIS — Z720 Tobacco use: Secondary | ICD-10-CM | POA: Insufficient documentation

## 2014-12-28 DIAGNOSIS — K219 Gastro-esophageal reflux disease without esophagitis: Secondary | ICD-10-CM | POA: Insufficient documentation

## 2014-12-28 DIAGNOSIS — M549 Dorsalgia, unspecified: Secondary | ICD-10-CM | POA: Insufficient documentation

## 2014-12-28 NOTE — ED Notes (Signed)
Pt reports upper abdominal pain since yesterday. Pt states he was seen here for same symptoms, symptoms came back after dialysis today. Pt did not have a full dialysis treatment because of the pain.

## 2014-12-29 ENCOUNTER — Emergency Department (HOSPITAL_COMMUNITY): Payer: Medicare Other

## 2014-12-29 ENCOUNTER — Emergency Department (HOSPITAL_COMMUNITY)
Admission: EM | Admit: 2014-12-29 | Discharge: 2014-12-29 | Disposition: A | Discharge status: Home / Self Care | Payer: Medicare Other | Attending: Emergency Medicine | Admitting: Emergency Medicine

## 2014-12-29 DIAGNOSIS — R079 Chest pain, unspecified: Secondary | ICD-10-CM

## 2014-12-29 DIAGNOSIS — R1011 Right upper quadrant pain: Secondary | ICD-10-CM | POA: Diagnosis not present

## 2014-12-29 DIAGNOSIS — R109 Unspecified abdominal pain: Secondary | ICD-10-CM

## 2014-12-29 DIAGNOSIS — G8929 Other chronic pain: Secondary | ICD-10-CM

## 2014-12-29 LAB — CBC WITH DIFFERENTIAL/PLATELET
Basophils Absolute: 0.1 10*3/uL (ref 0.0–0.1)
Basophils Relative: 1 % (ref 0–1)
EOS ABS: 0.2 10*3/uL (ref 0.0–0.7)
EOS PCT: 3 % (ref 0–5)
HCT: 33.5 % — ABNORMAL LOW (ref 39.0–52.0)
Hemoglobin: 10.9 g/dL — ABNORMAL LOW (ref 13.0–17.0)
Lymphocytes Relative: 22 % (ref 12–46)
Lymphs Abs: 2.1 10*3/uL (ref 0.7–4.0)
MCH: 29.2 pg (ref 26.0–34.0)
MCHC: 32.5 g/dL (ref 30.0–36.0)
MCV: 89.8 fL (ref 78.0–100.0)
MONO ABS: 0.8 10*3/uL (ref 0.1–1.0)
MONOS PCT: 8 % (ref 3–12)
NEUTROS ABS: 6.3 10*3/uL (ref 1.7–7.7)
Neutrophils Relative %: 66 % (ref 43–77)
Platelets: 210 10*3/uL (ref 150–400)
RBC: 3.73 MIL/uL — AB (ref 4.22–5.81)
RDW: 13.9 % (ref 11.5–15.5)
WBC: 9.4 10*3/uL (ref 4.0–10.5)

## 2014-12-29 LAB — COMPREHENSIVE METABOLIC PANEL
ALBUMIN: 3.8 g/dL (ref 3.5–5.0)
ALT: 22 U/L (ref 17–63)
AST: 16 U/L (ref 15–41)
Alkaline Phosphatase: 49 U/L (ref 38–126)
Anion gap: 12 (ref 5–15)
BILIRUBIN TOTAL: 0.4 mg/dL (ref 0.3–1.2)
BUN: 8 mg/dL (ref 6–20)
CO2: 29 mmol/L (ref 22–32)
Calcium: 9.3 mg/dL (ref 8.9–10.3)
Chloride: 96 mmol/L — ABNORMAL LOW (ref 101–111)
Creatinine, Ser: 6.02 mg/dL — ABNORMAL HIGH (ref 0.61–1.24)
GFR calc Af Amer: 13 mL/min — ABNORMAL LOW (ref >60)
GFR calc non Af Amer: 11 mL/min — ABNORMAL LOW (ref >60)
Glucose, Bld: 93 mg/dL (ref 65–99)
Potassium: 3.6 mmol/L (ref 3.5–5.1)
SODIUM: 137 mmol/L (ref 135–145)
Total Protein: 7.3 g/dL (ref 6.5–8.1)

## 2014-12-29 LAB — I-STAT TROPONIN, ED: TROPONIN I, POC: 0 ng/mL (ref 0.00–0.08)

## 2014-12-29 LAB — I-STAT CG4 LACTIC ACID, ED: Lactic Acid, Venous: 0.63 mmol/L (ref 0.5–2.0)

## 2014-12-29 LAB — LIPASE, BLOOD: Lipase: 22 U/L (ref 22–51)

## 2014-12-29 MED ORDER — HYDROMORPHONE HCL 1 MG/ML IJ SOLN
1.0000 mg | Freq: Once | INTRAMUSCULAR | Status: AC
  Administered 2014-12-29: 1 mg via INTRAVENOUS
  Filled 2014-12-29: qty 1

## 2014-12-29 MED ORDER — FENTANYL CITRATE (PF) 100 MCG/2ML IJ SOLN
100.0000 ug | Freq: Once | INTRAMUSCULAR | Status: AC
  Administered 2014-12-29: 100 ug via INTRAVENOUS
  Filled 2014-12-29: qty 2

## 2014-12-29 MED ORDER — IBUPROFEN 600 MG PO TABS: 600.0000 mg | ORAL_TABLET | Freq: Four times a day (QID) | ORAL | Status: DC | PRN

## 2014-12-29 MED ORDER — ONDANSETRON HCL 4 MG/2ML IJ SOLN
4.0000 mg | Freq: Once | INTRAMUSCULAR | Status: AC
  Administered 2014-12-29: 4 mg via INTRAVENOUS

## 2014-12-29 MED ORDER — ONDANSETRON 8 MG PO TBDP: 8.0000 mg | ORAL_TABLET | Freq: Three times a day (TID) | ORAL | Status: DC | PRN

## 2014-12-29 NOTE — ED Notes (Signed)
Pt c/o sharp pain on his mid chest, Dr. Kathrynn Humble notified. MD to review pt's chart.

## 2014-12-29 NOTE — Discharge Instructions (Signed)
We saw you in the ER for the abdominal pain. All the results in the ER are normal. We are not sure what is causing your symptoms. The workup in the ER is not complete, and is limited to screening for life threatening and emergent conditions only, so please see a primary care doctor for further evaluation.   Abdominal Pain Many things can cause abdominal pain. Usually, abdominal pain is not caused by a disease and will improve without treatment. It can often be observed and treated at home. Your health care provider will do a physical exam and possibly order blood tests and X-rays to help determine the seriousness of your pain. However, in many cases, more time must pass before a clear cause of the pain can be found. Before that point, your health care provider may not know if you need more testing or further treatment. HOME CARE INSTRUCTIONS  Monitor your abdominal pain for any changes. The following actions may help to alleviate any discomfort you are experiencing:  Only take over-the-counter or prescription medicines as directed by your health care provider.  Do not take laxatives unless directed to do so by your health care provider.  Try a clear liquid diet (broth, tea, or water) as directed by your health care provider. Slowly move to a bland diet as tolerated. SEEK MEDICAL CARE IF:  You have unexplained abdominal pain.  You have abdominal pain associated with nausea or diarrhea.  You have pain when you urinate or have a bowel movement.  You experience abdominal pain that wakes you in the night.  You have abdominal pain that is worsened or improved by eating food.  You have abdominal pain that is worsened with eating fatty foods.  You have a fever. SEEK IMMEDIATE MEDICAL CARE IF:   Your pain does not go away within 2 hours.  You keep throwing up (vomiting).  Your pain is felt only in portions of the abdomen, such as the right side or the left lower portion of the  abdomen.  You pass bloody or black tarry stools. MAKE SURE YOU:  Understand these instructions.   Will watch your condition.   Will get help right away if you are not doing well or get worse.  Document Released: 04/29/2005 Document Revised: 07/25/2013 Document Reviewed: 03/29/2013 [facility] Patient Information 2015 [place], [place]. This information is not intended to replace advice given to you by your health care provider. Make sure you discuss any questions you have with your health care provider.

## 2014-12-29 NOTE — ED Notes (Signed)
Discharge instructions and prescriptions reviewed, voiced understanding.

## 2014-12-29 NOTE — ED Provider Notes (Signed)
CSN: [id number]     Arrival date & time 12/28/14  2329 History   This chart was scribed for Varney Biles, MD by Evelene Croon, ED Scribe. This patient was seen in room D31C/D31C and the patient's care was started 12:56 AM.     Chief Complaint  Patient presents with  . Abdominal Pain    The history is provided by the patient. No language interpreter was used.     HPI Comments:  [name] is a 29 y.o. male with a past medical hx of renal insufficiency who presents to the Emergency Department complaining of moderate-severe lower CP/epigastric chest pain and upper abdominal pain that started after dialysis around 1615 today. He describes his pain as sharp and notes pain radiates to his back. He reports associated nausea and vomiting; states he was  unable to tolerate PO oxycodone taken PTA. He has a h/o similar pain secondary to lap band surgery. The band was removed on 0000000 due to complications; he notes he only had the band for about 1 month. Pt was seen in the ED on 5/26 for same pain, had negative CT abdomen and notes pain resolved after visit. He denies fever, chills, numbness and weakness to his BLE. No alleviating factors noted.   Past Medical History  Diagnosis Date  . Hyperparathyroidism   . Morbid obesity   . Arthritis   . Complication of anesthesia     A little while to wake up after knee surgery in 2008  . Family history of anesthesia complication     mom slow to wake up  . Headache(784.0)     occasionally  . Dizziness     when coming off of dialysis  . Joint pain   . Joint swelling   . GERD (gastroesophageal reflux disease)     takes Omeprazole as needed  . Hypertension   . Renal disorder     MWF and goes to [facility]  . Renal insufficiency   . PONV (postoperative nausea and vomiting)    Past Surgical History  Procedure Laterality Date  . Av fistula placement Bilateral     Uses Right arm  . Esophagogastroduodenoscopy endoscopy    . Knee arthroscopy Left  2007  . Parathyroidectomy N/A 03/30/2013    Procedure: TOTAL PARATHYROIDECTOMY WITH AUTOTRANSPLANT;  Surgeon: Earnstine Regal, MD;  Location: [place];  Service: General;  Laterality: N/A;  Autotransplant to left lower arm.  . Total knee arthroplasty Right 03/27/2014    DR Marlou Sa  . Total knee arthroplasty Right 03/27/2014    Procedure: UNICOMPARTMENTAL ARTHROPLASTY;  Surgeon: Meredith Pel, MD;  Location: [place];  Service: Orthopedics;  Laterality: Right;  . Breath tek h pylori N/A 05/15/2014    Procedure: BREATH TEK H PYLORI;  Surgeon: Alphonsa Overall, MD;  Location: [facility];  Service: General;  Laterality: N/A;  . Laparoscopic gastric banding N/A 11/12/2014    Procedure: LAPAROSCOPIC GASTRIC BANDING;  Surgeon: Alphonsa Overall, MD;  Location: [facility];  Service: General;  Laterality: N/A;  . Esophagogastroduodenoscopy N/A 12/07/2014    Procedure: ESOPHAGOGASTRODUODENOSCOPY (EGD);  Surgeon: Alphonsa Overall, MD;  Location: [place];  Service: General;  Laterality: N/A;  . Esophagogastroduodenoscopy N/A 12/17/2014    Procedure: ESOPHAGOGASTRODUODENOSCOPY (EGD);  Surgeon: Alphonsa Overall, MD;  Location: [place];  Service: General;  Laterality: N/A;   History reviewed. No pertinent family history. History  Substance Use Topics  . Smoking status: Current Some Day Smoker -- 0.20 packs/day for 13 years  Types: Cigarettes  . Smokeless tobacco: Never Used     Comment: 2 cigs a day  . Alcohol Use: No    Review of Systems  Constitutional: Negative for fever and chills.  Gastrointestinal: Positive for nausea, vomiting and abdominal pain.  Musculoskeletal: Positive for back pain.  Neurological: Negative for weakness and numbness.  All other systems reviewed and are negative.     Allergies  Review of patient's allergies indicates no known allergies.  Home Medications   Prior to Admission medications   Medication Sig Start Date End Date Taking? Authorizing Provider  flintstones complete (FLINTSTONES)  60 MG chewable tablet Chew 1 tablet by mouth daily.   Yes Historical Provider, MD  omeprazole (PRILOSEC) 20 MG capsule Take 20 mg by mouth daily as needed (for heartburn).    Yes Historical Provider, MD  oxyCODONE (ROXICODONE) 5 MG/5ML solution Take 5 mLs (5 mg total) by mouth every 4 (four) hours as needed for moderate pain or severe pain. 12/08/14  Yes Saverio Danker, [name]  promethazine (PHENERGAN) 25 MG tablet Take 1 tablet (25 mg total) by mouth every 8 (eight) hours as needed for nausea or vomiting. 12/27/14  Yes Dalia Heading, [name]  sevelamer carbonate (RENVELA) 2.4 G PACK Take 2.4 g by mouth daily as needed (lower the amount of phosphorus in the blood).    Yes Historical Provider, MD  ibuprofen (ADVIL,MOTRIN) 600 MG tablet Take 1 tablet (600 mg total) by mouth every 6 (six) hours as needed. 12/29/14   Varney Biles, MD  PERCOCET 5-325 MG per tablet Take 1 tablet by mouth every 6 (six) hours as needed for severe pain. Patient not taking: Reported on 12/29/2014 12/27/14   Dalia Heading, [name]   BP 119/70 mmHg  Pulse 86  Temp(Src) 98.6 F (37 C) (Oral)  Resp 19  SpO2 96% Physical Exam  Constitutional: He is oriented to person, place, and time. He appears well-developed and well-nourished.  HENT:  Head: Normocephalic and atraumatic.  Eyes: Conjunctivae are normal.  Neck: Normal range of motion.  Cardiovascular: Normal rate, regular rhythm and normal heart sounds.   Bilateral equal femoral pulses  BLE with nml equal pulses and sensation Equal pulses and nml sensation in BUE   Pulmonary/Chest: Effort normal and breath sounds normal. No respiratory distress.  Abdominal: Soft. He exhibits no distension. There is tenderness. There is guarding.  3 incision wounds to  anterior abdomen; no evidence of abscess, infection or drainage from the site  Diffuse TTP worse in upper quadrants with guarding   Neurological: He is alert and oriented to person, place, and time.  Skin: Skin is warm and  dry.  Left forearm fistula with good thrill  Skin warm to touch   Psychiatric: He has a normal mood and affect.  Nursing note and vitals reviewed.   ED Course  Procedures   DIAGNOSTIC STUDIES:  Oxygen Saturation is 98% on RA, normal by my interpretation.    COORDINATION OF CARE:  1:03 AM Will order pain meds. Discussed treatment plan with pt at bedside and pt agreed to plan.  Labs Review Labs Reviewed  CBC WITH DIFFERENTIAL/PLATELET - Abnormal; Notable for the following:    RBC 3.73 (*)    Hemoglobin 10.9 (*)    HCT 33.5 (*)    All other components within normal limits  COMPREHENSIVE METABOLIC PANEL - Abnormal; Notable for the following:    Chloride 96 (*)    Creatinine, Ser 6.02 (*)    GFR calc non Af Wyvonnia Lora  11 (*)    GFR calc Af Amer 13 (*)    All other components within normal limits  LIPASE, BLOOD  URINALYSIS, ROUTINE W REFLEX MICROSCOPIC (NOT AT [facility])  I-STAT TROPOININ, ED  I-STAT CG4 LACTIC ACID, ED    Imaging Review Ct Abdomen Pelvis W Contrast  12/27/2014   CLINICAL DATA:  Acute abdominal pain, nausea and vomiting. Recent removal of the gastric band 2 [place] ago. Dialysis dependent.  EXAM: CT ABDOMEN AND PELVIS WITH CONTRAST  TECHNIQUE: Multidetector CT imaging of the abdomen and pelvis was performed using the standard protocol following bolus administration of intravenous contrast.  CONTRAST:  136mL OMNIPAQUE IOHEXOL 300 MG/ML  SOLN  COMPARISON:  12/10/2014  FINDINGS: Lower chest: Minor scattered bibasilar atelectasis. Normal heart size. No pericardial or pleural effusion. No hiatal hernia.  Abdomen: Interval removal of the laparoscopic gastric band at the GE junction.  Negative for obstruction, dilatation, ileus, or free air.  No abdominal fluid collection, hemorrhage, abscess, or adenopathy.  Postop changes of the anterior abdominal wall from the recent laparoscopic surgery for gastric band removal.  Liver, gallbladder, biliary system, pancreas, spleen, and adrenal  glands are within normal limits for age and demonstrate no acute process.  Chronic atrophy and polycystic disease of the kidneys as before. No renal obstruction or hydronephrosis.  Intact aorta.  Negative for aneurysm.  Minor atherosclerotic change.  Pelvis: Normal appendix in the midline, image 72. Distal colon is collapsed. No pelvic free fluid, fluid collection, hemorrhage, abscess or adenopathy. No inguinal abnormality or hernia. Bladder is completely collapsed.  No acute osseous finding.  IMPRESSION: Interval removal of the gastric band.  No evidence of bowel obstruction, ileus, free air, or abscess.  Chronic renal atrophy with polycystic renal disease.  Postoperative changes of the anterior abdominal wall with scattered areas of bruising related to the recent laparoscopic procedure for band removal.  Normal appendix.  No other acute intra-abdominal or pelvic process.   Electronically Signed   By: Jerilynn Mages.  Shick M.D.   On: 12/27/2014 18:45   Dg Chest Port 1 View  12/29/2014   CLINICAL DATA:  Chest pain  EXAM: PORTABLE CHEST - 1 VIEW  COMPARISON:  12/05/2014  FINDINGS: Hypoventilation with interstitial crowding. There is no edema, consolidation, effusion, or pneumothorax. Normal heart size and mediastinal contours. No acute osseous findings.  Surgical changes at the thoracic inlet, presumably related to patient's history of hyperparathyroidism.  IMPRESSION: Negative low volume chest.   Electronically Signed   By: Monte Fantasia M.D.   On: 12/29/2014 04:00   Dg Abd Acute W/chest  12/27/2014   CLINICAL DATA:  Lap band inserted 1 month ago and removed 8 days ago. Waves of sharp abdominal pain. Left upper quadrant pain started on Sunday. Epigastric pain began yesterday. Worse with movement.  EXAM: DG ABDOMEN ACUTE W/ 1V CHEST  COMPARISON:  CT of the abdomen and pelvis on 12/10/2014, chest x-ray on 12/05/2014  FINDINGS: Heart size is mildly enlarged. There are changes of mild interstitial edema. There are no focal  consolidations.  No free intraperitoneal air. There is mild dilatation of central small bowel loops. Colonic loops are not dilated. Visualized osseous structures have a normal appearance. No evidence for organomegaly.  IMPRESSION: 1. Mild cardiomegaly and interstitial edema. 2. Small bowel dilatation consistent with early or partial small bowel obstruction versus focal ileus. 3. No free intraperitoneal air.   Electronically Signed   By: Nolon Nations M.D.   On: 12/27/2014 15:26  EKG Interpretation   Date/Time:  Friday Dec 28 2014 23:37:27 EDT Ventricular Rate:  86 PR Interval:  164 QRS Duration: 106 QT Interval:  362 QTC Calculation: 433 R Axis:   35 Text Interpretation:  Normal sinus rhythm Normal ECG No significant change  since last tracing Confirmed by Peyton Rossner, MD, Aryana Wonnacott ([id number]) on 12/29/2014  1:23:46 AM      @6  am - pt has mild pain now, and is comfortable. Discussed that we have no merits for CT scan now, and probably would do more harm than good - especially with neg CT yday. Lactate is pending. Trops are neg. No tachycardia, normal vascular exam - still thinking dissection unlikely.  @6 :20 am - neg lactate.  MDM   Final diagnoses:  Chest pain  Chronic abdominal pain    I personally performed the services described in this documentation, which was scribed in my presence. The recorded information has been reviewed and is accurate.  Pt comes in with cc of abd pain. Epigastric and periumbilical, diffuse pain, radiating to back. No tachycardia, no neuro complains/deficits. Normal pulse exam for lower extremity. Similar pain since lap band procedure, and s/p 4 CT scan since 4/14- all neg. Ddx: Dissection, Mesenteric ischemia.   Varney Biles, MD 12/29/14 [id number]

## 2014-12-31 ENCOUNTER — Emergency Department (HOSPITAL_COMMUNITY)
Admission: EM | Admit: 2014-12-31 | Discharge: 2015-01-01 | Disposition: A | Discharge status: Home / Self Care | Payer: Medicare Other | Attending: Emergency Medicine | Admitting: Emergency Medicine

## 2014-12-31 ENCOUNTER — Emergency Department (HOSPITAL_COMMUNITY): Payer: Medicare Other

## 2014-12-31 ENCOUNTER — Encounter (HOSPITAL_COMMUNITY): Payer: Self-pay | Admitting: Emergency Medicine

## 2014-12-31 DIAGNOSIS — Z720 Tobacco use: Secondary | ICD-10-CM | POA: Diagnosis not present

## 2014-12-31 DIAGNOSIS — K219 Gastro-esophageal reflux disease without esophagitis: Secondary | ICD-10-CM | POA: Diagnosis not present

## 2014-12-31 DIAGNOSIS — R1084 Generalized abdominal pain: Secondary | ICD-10-CM | POA: Diagnosis present

## 2014-12-31 DIAGNOSIS — M1990 Unspecified osteoarthritis, unspecified site: Secondary | ICD-10-CM | POA: Insufficient documentation

## 2014-12-31 DIAGNOSIS — R109 Unspecified abdominal pain: Secondary | ICD-10-CM

## 2014-12-31 DIAGNOSIS — I10 Essential (primary) hypertension: Secondary | ICD-10-CM | POA: Diagnosis not present

## 2014-12-31 DIAGNOSIS — Z87448 Personal history of other diseases of urinary system: Secondary | ICD-10-CM | POA: Diagnosis not present

## 2014-12-31 DIAGNOSIS — K2970 Gastritis, unspecified, without bleeding: Secondary | ICD-10-CM

## 2014-12-31 LAB — CBC WITH DIFFERENTIAL/PLATELET
BASOS ABS: 0.1 10*3/uL (ref 0.0–0.1)
Basophils Relative: 1 % (ref 0–1)
EOS ABS: 0.2 10*3/uL (ref 0.0–0.7)
Eosinophils Relative: 3 % (ref 0–5)
HEMATOCRIT: 31.8 % — AB (ref 39.0–52.0)
HEMOGLOBIN: 10.6 g/dL — AB (ref 13.0–17.0)
LYMPHS PCT: 20 % (ref 12–46)
Lymphs Abs: 1.5 10*3/uL (ref 0.7–4.0)
MCH: 29.4 pg (ref 26.0–34.0)
MCHC: 33.3 g/dL (ref 30.0–36.0)
MCV: 88.3 fL (ref 78.0–100.0)
Monocytes Absolute: 0.8 10*3/uL (ref 0.1–1.0)
Monocytes Relative: 11 % (ref 3–12)
Neutro Abs: 4.9 10*3/uL (ref 1.7–7.7)
Neutrophils Relative %: 65 % (ref 43–77)
Platelets: 201 10*3/uL (ref 150–400)
RBC: 3.6 MIL/uL — AB (ref 4.22–5.81)
RDW: 13.9 % (ref 11.5–15.5)
WBC: 7.4 10*3/uL (ref 4.0–10.5)

## 2014-12-31 LAB — COMPREHENSIVE METABOLIC PANEL
ALT: 17 U/L (ref 17–63)
AST: 11 U/L — ABNORMAL LOW (ref 15–41)
Albumin: 3.8 g/dL (ref 3.5–5.0)
Alkaline Phosphatase: 47 U/L (ref 38–126)
Anion gap: 12 (ref 5–15)
BILIRUBIN TOTAL: 0.8 mg/dL (ref 0.3–1.2)
BUN: 13 mg/dL (ref 6–20)
CALCIUM: 9.6 mg/dL (ref 8.9–10.3)
CO2: 26 mmol/L (ref 22–32)
CREATININE: 9.15 mg/dL — AB (ref 0.61–1.24)
Chloride: 99 mmol/L — ABNORMAL LOW (ref 101–111)
GFR calc non Af Amer: 7 mL/min — ABNORMAL LOW (ref >60)
GFR, EST AFRICAN AMERICAN: 8 mL/min — AB (ref >60)
Glucose, Bld: 87 mg/dL (ref 65–99)
Potassium: 3.1 mmol/L — ABNORMAL LOW (ref 3.5–5.1)
Sodium: 137 mmol/L (ref 135–145)
TOTAL PROTEIN: 7.3 g/dL (ref 6.5–8.1)

## 2014-12-31 LAB — LIPASE, BLOOD: LIPASE: 23 U/L (ref 22–51)

## 2014-12-31 MED ORDER — ONDANSETRON HCL 4 MG/2ML IJ SOLN
4.0000 mg | Freq: Once | INTRAMUSCULAR | Status: AC
  Administered 2014-12-31: 4 mg via INTRAMUSCULAR
  Filled 2014-12-31: qty 2

## 2014-12-31 MED ORDER — HYDROMORPHONE HCL 1 MG/ML IJ SOLN
1.0000 mg | Freq: Once | INTRAMUSCULAR | Status: AC
  Administered 2015-01-01: 1 mg via INTRAVENOUS
  Filled 2014-12-31: qty 1

## 2014-12-31 MED ORDER — METOCLOPRAMIDE HCL 5 MG/ML IJ SOLN
10.0000 mg | Freq: Once | INTRAMUSCULAR | Status: AC
  Administered 2015-01-01: 10 mg via INTRAVENOUS
  Filled 2014-12-31: qty 2

## 2014-12-31 MED ORDER — HYDROMORPHONE HCL 1 MG/ML IJ SOLN
2.0000 mg | Freq: Once | INTRAMUSCULAR | Status: AC
Start: 2014-12-31 — End: 2014-12-31
  Administered 2014-12-31: 2 mg via INTRAMUSCULAR
  Filled 2014-12-31: qty 2

## 2014-12-31 MED ORDER — GI COCKTAIL ~~LOC~~
30.0000 mL | Freq: Once | ORAL | Status: AC
  Administered 2014-12-31: 30 mL via ORAL
  Filled 2014-12-31: qty 30

## 2014-12-31 NOTE — ED Notes (Signed)
Patient transported to X-ray 

## 2014-12-31 NOTE — ED Notes (Signed)
Pt. reports mid abdominal pain / epigastric pain radiating to mid back radiating onset today , denies emesis or diarrhea. Pt. did not complete his hemodialysis today . Denies fever or chills.

## 2014-12-31 NOTE — ED Notes (Signed)
Pt states that he had lap band placed on April 11 and that it was then taken out on May 11 due to complications.

## 2014-12-31 NOTE — ED Notes (Signed)
Pt placed in a gown and hooked up to the monitor with 5 lead, BP cuff and pulse ox 

## 2014-12-31 NOTE — ED Provider Notes (Signed)
CSN: [id number]     Arrival date & time 12/31/14  1928 History   First MD Initiated Contact with Patient 12/31/14 2101     Chief Complaint  Patient presents with  . Abdominal Pain     (Consider location/radiation/quality/duration/timing/severity/associated sxs/prior Treatment) Patient is a 29 y.o. male presenting with abdominal pain. The history is provided by the patient. No language interpreter was used.  Abdominal Pain Pain location:  Generalized Pain quality: aching and cramping   Pain radiates to:  Does not radiate Pain severity:  Moderate Onset quality:  Sudden Timing:  Intermittent Progression:  Waxing and waning Chronicity:  New Relieved by:  Nothing Ineffective treatments:  NSAIDs Associated symptoms: nausea and vomiting   Associated symptoms: no diarrhea   Risk factors: NSAID use and obesity   Pt had gastric banding in April.   Pt had band removed May 18.   Pt was seen here 2 days and 4 days ago for severe abdominal cramping.  Pt in on dialysis.  Pt reports he had to leave dialysis early due to pain.  Pt took percocet but vomited it up.    Past Medical History  Diagnosis Date  . Hyperparathyroidism   . Morbid obesity   . Arthritis   . Complication of anesthesia     A little while to wake up after knee surgery in 2008  . Family history of anesthesia complication     mom slow to wake up  . Headache(784.0)     occasionally  . Dizziness     when coming off of dialysis  . Joint pain   . Joint swelling   . GERD (gastroesophageal reflux disease)     takes Omeprazole as needed  . Hypertension   . Renal disorder     MWF and goes to [facility]  . Renal insufficiency   . PONV (postoperative nausea and vomiting)    Past Surgical History  Procedure Laterality Date  . Av fistula placement Bilateral     Uses Right arm  . Esophagogastroduodenoscopy endoscopy    . Knee arthroscopy Left 2007  . Parathyroidectomy N/A 03/30/2013    Procedure: TOTAL PARATHYROIDECTOMY  WITH AUTOTRANSPLANT;  Surgeon: Earnstine Regal, MD;  Location: [place];  Service: General;  Laterality: N/A;  Autotransplant to left lower arm.  . Total knee arthroplasty Right 03/27/2014    DR Marlou Sa  . Total knee arthroplasty Right 03/27/2014    Procedure: UNICOMPARTMENTAL ARTHROPLASTY;  Surgeon: Meredith Pel, MD;  Location: [place];  Service: Orthopedics;  Laterality: Right;  . Breath tek h pylori N/A 05/15/2014    Procedure: BREATH TEK H PYLORI;  Surgeon: Alphonsa Overall, MD;  [facility];  Service: General;  Laterality: N/A;  . Laparoscopic gastric banding N/A 11/12/2014    Procedure: LAPAROSCOPIC GASTRIC BANDING;  Surgeon: Alphonsa Overall, MD;  Location: [facility];  Service: General;  Laterality: N/A;  . Esophagogastroduodenoscopy N/A 12/07/2014    Procedure: ESOPHAGOGASTRODUODENOSCOPY (EGD);  Surgeon: Alphonsa Overall, MD;  Location: [place];  Service: General;  Laterality: N/A;  . Esophagogastroduodenoscopy N/A 12/17/2014    Procedure: ESOPHAGOGASTRODUODENOSCOPY (EGD);  Surgeon: Alphonsa Overall, MD;  Location: [place];  Service: General;  Laterality: N/A;   No family history on file. History  Substance Use Topics  . Smoking status: Current Some Day Smoker -- 0.20 packs/day for 13 years    Types: Cigarettes  . Smokeless tobacco: Never Used     Comment: 2 cigs a day  . Alcohol Use: No  Review of Systems  Gastrointestinal: Positive for nausea, vomiting and abdominal pain. Negative for diarrhea.  All other systems reviewed and are negative.     Allergies  Review of patient's allergies indicates no known allergies.  Home Medications   Prior to Admission medications   Medication Sig Start Date End Date Taking? Authorizing Provider  ibuprofen (ADVIL,MOTRIN) 600 MG tablet Take 1 tablet (600 mg total) by mouth every 6 (six) hours as needed. Patient taking differently: Take 600 mg by mouth every 6 (six) hours as needed for moderate pain.  12/29/14  Yes Varney Biles, MD  omeprazole  (PRILOSEC) 20 MG capsule Take 20 mg by mouth daily as needed (for heartburn).    Yes Historical Provider, MD  ondansetron (ZOFRAN ODT) 8 MG disintegrating tablet Take 1 tablet (8 mg total) by mouth every 8 (eight) hours as needed for nausea. 12/29/14  Yes Ankit Nanavati, MD  PERCOCET 5-325 MG per tablet Take 1 tablet by mouth every 6 (six) hours as needed for severe pain. 12/27/14  Yes Christopher Lawyer, [name]  promethazine (PHENERGAN) 25 MG tablet Take 1 tablet (25 mg total) by mouth every 8 (eight) hours as needed for nausea or vomiting. 12/27/14  Yes Dalia Heading, [name]  sevelamer carbonate (RENVELA) 2.4 G PACK Take 2.4 g by mouth daily as needed (lower the amount of phosphorus in the blood).    Yes Historical Provider, MD  flintstones complete (FLINTSTONES) 60 MG chewable tablet Chew 1 tablet by mouth daily.    Historical Provider, MD   BP 141/71 mmHg  Pulse 74  Temp(Src) 99.4 F (37.4 C) (Oral)  Resp 16  Ht 5\' 5"  (1.651 m)  Wt 280 lb 13.9 oz (127.4 kg)  BMI 46.74 kg/m2  SpO2 100% Physical Exam  Constitutional: He is oriented to person, place, and time. He appears well-developed and well-nourished.  HENT:  Head: Normocephalic and atraumatic.  Right Ear: External ear normal.  Left Ear: External ear normal.  Nose: Nose normal.  Mouth/Throat: Oropharynx is clear and moist.  Eyes: Conjunctivae and EOM are normal. Pupils are equal, round, and reactive to light.  Neck: Normal range of motion.  Cardiovascular: Normal rate, regular rhythm and normal heart sounds.   Pulmonary/Chest: Effort normal.  Abdominal: He exhibits no distension. There is tenderness.  Musculoskeletal: Normal range of motion.  Neurological: He is alert and oriented to person, place, and time.  Skin: Skin is warm.  Psychiatric: He has a normal mood and affect.  Nursing note and vitals reviewed.   ED Course  Procedures (including critical care time) Labs Review Labs Reviewed  CBC WITH DIFFERENTIAL/PLATELET -  Abnormal; Notable for the following:    RBC 3.60 (*)    Hemoglobin 10.6 (*)    HCT 31.8 (*)    All other components within normal limits  COMPREHENSIVE METABOLIC PANEL - Abnormal; Notable for the following:    Potassium 3.1 (*)    Chloride 99 (*)    Creatinine, Ser 9.15 (*)    AST 11 (*)    GFR calc non Af Amer 7 (*)    GFR calc Af Amer 8 (*)    All other components within normal limits  LIPASE, BLOOD    Imaging Review Dg Abd Acute W/chest  12/31/2014   CLINICAL DATA:  Abdominal and chest pain since lap band surgery in April.  EXAM: DG ABDOMEN ACUTE W/ 1V CHEST  COMPARISON:  12/27/2014 and chest x-ray 12/29/2014 as well as abdominal CT 12/27/2014  FINDINGS: Lungs are  adequately inflated without consolidation or effusion. Cardiomediastinal silhouette and remainder of the chest is unchanged.  Abdominal and pelvic films demonstrate contrast throughout the colon. There are several air-filled small bowel loops in the mid to upper left abdomen at the upper limits of normal in diameter without significant change as there shown to be no evidence of obstruction on CT. No free peritoneal air. No mass or mass effect. Remainder the exam is unchanged.  IMPRESSION: A few air-filled small bowel loops in the left mid to upper abdomen at the upper limits of normal in diameter. No free peritoneal air. Findings are likely due to ileus.  No acute cardiopulmonary disease.   Electronically Signed   By: Marin Olp M.D.   On: 12/31/2014 21:55     EKG Interpretation None      MDM  Bun is 13 creat is 9.15.  Pt given dilaudid and zofran Im.   Abdominal series shows no free air, no evidence of sbo.     Final diagnoses:  Abdominal pain  Gastritis    Pt reports feeling better after gi cocktail, reglan and dilaudid.    I advised pt to stop ibuprofen.   Rx for gi cocktail prn severe pain.  reglan for nausea.     Hollace Kinnier Solis, [name] 01/01/15 [id number]  Orpah Greek, MD 01/01/15 [phone]

## 2015-01-01 DIAGNOSIS — K2970 Gastritis, unspecified, without bleeding: Secondary | ICD-10-CM | POA: Diagnosis not present

## 2015-01-01 LAB — TROPONIN I: Troponin I: <0.03 ng/mL (ref <0.031)

## 2015-01-01 MED ORDER — GI COCKTAIL ~~LOC~~: 30.0000 mL | Freq: Three times a day (TID) | ORAL | Status: DC | PRN

## 2015-01-01 MED ORDER — METOCLOPRAMIDE HCL 10 MG PO TABS: 10.0000 mg | ORAL_TABLET | Freq: Four times a day (QID) | ORAL | Status: DC

## 2015-01-01 NOTE — ED Notes (Signed)
Pt placed on 2L [place]

## 2015-01-01 NOTE — Discharge Instructions (Signed)

## 2015-01-01 NOTE — ED Notes (Signed)
Pt moved up to 3 L [place], pts oxygen saturation level drops when pt goes to sleep.

## 2015-01-02 ENCOUNTER — Emergency Department (HOSPITAL_COMMUNITY)
Admission: EM | Admit: 2015-01-02 | Discharge: 2015-01-03 | Disposition: A | Discharge status: Home / Self Care | Payer: Medicare Other | Attending: Emergency Medicine | Admitting: Emergency Medicine

## 2015-01-02 ENCOUNTER — Emergency Department (HOSPITAL_COMMUNITY): Payer: Medicare Other

## 2015-01-02 ENCOUNTER — Encounter (HOSPITAL_COMMUNITY): Payer: Self-pay

## 2015-01-02 DIAGNOSIS — Z8739 Personal history of other diseases of the musculoskeletal system and connective tissue: Secondary | ICD-10-CM | POA: Diagnosis not present

## 2015-01-02 DIAGNOSIS — Z720 Tobacco use: Secondary | ICD-10-CM | POA: Diagnosis not present

## 2015-01-02 DIAGNOSIS — Z87448 Personal history of other diseases of urinary system: Secondary | ICD-10-CM | POA: Diagnosis not present

## 2015-01-02 DIAGNOSIS — R1013 Epigastric pain: Secondary | ICD-10-CM | POA: Insufficient documentation

## 2015-01-02 DIAGNOSIS — R1010 Upper abdominal pain, unspecified: Secondary | ICD-10-CM | POA: Diagnosis not present

## 2015-01-02 DIAGNOSIS — Z79899 Other long term (current) drug therapy: Secondary | ICD-10-CM | POA: Diagnosis not present

## 2015-01-02 DIAGNOSIS — R079 Chest pain, unspecified: Secondary | ICD-10-CM | POA: Diagnosis present

## 2015-01-02 DIAGNOSIS — K219 Gastro-esophageal reflux disease without esophagitis: Secondary | ICD-10-CM | POA: Insufficient documentation

## 2015-01-02 LAB — HEPATIC FUNCTION PANEL
ALT: 17 U/L (ref 17–63)
AST: 14 U/L — AB (ref 15–41)
Albumin: 3.9 g/dL (ref 3.5–5.0)
Alkaline Phosphatase: 49 U/L (ref 38–126)
Bilirubin, Direct: 0.1 mg/dL (ref 0.1–0.5)
Indirect Bilirubin: 0.6 mg/dL (ref 0.3–0.9)
Total Bilirubin: 0.7 mg/dL (ref 0.3–1.2)
Total Protein: 7.2 g/dL (ref 6.5–8.1)

## 2015-01-02 LAB — BASIC METABOLIC PANEL
Anion gap: 14 (ref 5–15)
BUN: 8 mg/dL (ref 6–20)
CO2: 26 mmol/L (ref 22–32)
Calcium: 11.1 mg/dL — ABNORMAL HIGH (ref 8.9–10.3)
Chloride: 95 mmol/L — ABNORMAL LOW (ref 101–111)
Creatinine, Ser: 6.05 mg/dL — ABNORMAL HIGH (ref 0.61–1.24)
GFR calc Af Amer: 13 mL/min — ABNORMAL LOW (ref >60)
GFR, EST NON AFRICAN AMERICAN: 11 mL/min — AB (ref >60)
Glucose, Bld: 86 mg/dL (ref 65–99)
Potassium: 3.3 mmol/L — ABNORMAL LOW (ref 3.5–5.1)
Sodium: 135 mmol/L (ref 135–145)

## 2015-01-02 LAB — CBC
HCT: 34 % — ABNORMAL LOW (ref 39.0–52.0)
HEMOGLOBIN: 11.1 g/dL — AB (ref 13.0–17.0)
MCH: 29.5 pg (ref 26.0–34.0)
MCHC: 32.6 g/dL (ref 30.0–36.0)
MCV: 90.4 fL (ref 78.0–100.0)
Platelets: 187 10*3/uL (ref 150–400)
RBC: 3.76 MIL/uL — ABNORMAL LOW (ref 4.22–5.81)
RDW: 14 % (ref 11.5–15.5)
WBC: 6.3 10*3/uL (ref 4.0–10.5)

## 2015-01-02 LAB — I-STAT TROPONIN, ED: Troponin i, poc: 0.01 ng/mL (ref 0.00–0.08)

## 2015-01-02 LAB — LIPASE, BLOOD: Lipase: 17 U/L — ABNORMAL LOW (ref 22–51)

## 2015-01-02 MED ORDER — HYDROMORPHONE HCL 1 MG/ML IJ SOLN
1.0000 mg | Freq: Once | INTRAMUSCULAR | Status: AC
  Administered 2015-01-02: 1 mg via INTRAVENOUS
  Filled 2015-01-02: qty 1

## 2015-01-02 MED ORDER — ONDANSETRON HCL 4 MG/2ML IJ SOLN
4.0000 mg | Freq: Once | INTRAMUSCULAR | Status: AC
  Administered 2015-01-03: 4 mg via INTRAVENOUS
  Filled 2015-01-02: qty 2

## 2015-01-02 MED ORDER — ONDANSETRON HCL 4 MG/2ML IJ SOLN
4.0000 mg | Freq: Once | INTRAMUSCULAR | Status: AC
  Administered 2015-01-02: 4 mg via INTRAVENOUS
  Filled 2015-01-02: qty 2

## 2015-01-02 NOTE — ED Notes (Signed)
Placed on 2L per PA

## 2015-01-02 NOTE — ED Notes (Signed)
Pt complains of a sharp chest pain that started today after trying to walk some, he also complains of shortness of breath after the walk also. Pt received dialysis today

## 2015-01-02 NOTE — ED Provider Notes (Signed)
CSN: [id number]     Arrival date & time 01/02/15  2115 History   First MD Initiated Contact with Patient 01/02/15 2223     Chief Complaint  Patient presents with  . Chest Pain     (Consider location/radiation/quality/duration/timing/severity/associated sxs/prior Treatment) HPI [name] is a 28 year old male with a history of hypertension, renal insufficiency, hyper parathyroidism, morbid obesity, gastric banding with reversal who presents for gastric abdominal pain that began today around 7 PM. He is well-known to the ED and has presented multiple times with the same complaint. He had gastric banding reversal on 12/12/2014. He was seen in the ED on 12/27/2014 and had a normal CT abdomen. He states he went to dialysis at 5 PM and did not have pain at that time. He had 2 bowel movements today that were normal. He makes his symptoms better or worse. He has had this several times in the past and Dr. Lucia Gaskins, his gastroenterologist who did the gastric banding and reversal is aware of this. He states he has an appointment with him tomorrow but wasn't so much pain and could not make it until tomorrow. He denies any fever, chills, shortness of breath, vomiting, constipation, diarrhea, dysuria, hematuria. Past Medical History  Diagnosis Date  . Hyperparathyroidism   . Morbid obesity   . Arthritis   . Complication of anesthesia     A little while to wake up after knee surgery in 2008  . Family history of anesthesia complication     mom slow to wake up  . Headache(784.0)     occasionally  . Dizziness     when coming off of dialysis  . Joint pain   . Joint swelling   . GERD (gastroesophageal reflux disease)     takes Omeprazole as needed  . Hypertension   . Renal disorder     MWF and goes to [facility]  . Renal insufficiency   . PONV (postoperative nausea and vomiting)    Past Surgical History  Procedure Laterality Date  . Av fistula placement Bilateral     Uses Right arm  .  Esophagogastroduodenoscopy endoscopy    . Knee arthroscopy Left 2007  . Parathyroidectomy N/A 03/30/2013    Procedure: TOTAL PARATHYROIDECTOMY WITH AUTOTRANSPLANT;  Surgeon: Earnstine Regal, MD;  Location: [place];  Service: General;  Laterality: N/A;  Autotransplant to left lower arm.  . Total knee arthroplasty Right 03/27/2014    DR Marlou Sa  . Total knee arthroplasty Right 03/27/2014    Procedure: UNICOMPARTMENTAL ARTHROPLASTY;  Surgeon: Meredith Pel, MD;  Location: [place];  Service: Orthopedics;  Laterality: Right;  . Breath tek h pylori N/A 05/15/2014    Procedure: BREATH TEK H PYLORI;  Surgeon: Alphonsa Overall, MD;  Location: [facility];  Service: General;  Laterality: N/A;  . Laparoscopic gastric banding N/A 11/12/2014    Procedure: LAPAROSCOPIC GASTRIC BANDING;  Surgeon: Alphonsa Overall, MD;  Location: [facility];  Service: General;  Laterality: N/A;  . Esophagogastroduodenoscopy N/A 12/07/2014    Procedure: ESOPHAGOGASTRODUODENOSCOPY (EGD);  Surgeon: Alphonsa Overall, MD;  Location: [place];  Service: General;  Laterality: N/A;  . Esophagogastroduodenoscopy N/A 12/17/2014    Procedure: ESOPHAGOGASTRODUODENOSCOPY (EGD);  Surgeon: Alphonsa Overall, MD;  Location: [place];  Service: General;  Laterality: N/A;   History reviewed. No pertinent family history. History  Substance Use Topics  . Smoking status: Current Some Day Smoker -- 0.20 packs/day for 13 years    Types: Cigarettes  . Smokeless tobacco: Never Used  Comment: 2 cigs a day  . Alcohol Use: No    Review of Systems  Respiratory: Negative for cough.   Cardiovascular: Negative for palpitations and leg swelling.  Genitourinary: Negative for difficulty urinating.  Neurological: Negative for light-headedness.  All other systems reviewed and are negative.     Allergies  Review of patient's allergies indicates no known allergies.  Home Medications   Prior to Admission medications   Medication Sig Start Date End Date Taking? Authorizing  Provider  calcium carbonate (TUMS - DOSED IN MG ELEMENTAL CALCIUM) 500 MG chewable tablet Chew 2 tablets by mouth 2 (two) times daily as needed for indigestion or heartburn.    Yes Historical Provider, MD  flintstones complete (FLINTSTONES) 60 MG chewable tablet Chew 1 tablet by mouth daily.   Yes Historical Provider, MD  metoCLOPramide (REGLAN) 10 MG tablet Take 1 tablet (10 mg total) by mouth every 6 (six) hours. Patient taking differently: Take 10 mg by mouth every 6 (six) hours as needed for nausea or vomiting.  01/01/15  Yes Fransico Meadow, [name]  omeprazole (PRILOSEC) 20 MG capsule Take 20 mg by mouth daily as needed (for heartburn).    Yes Historical Provider, MD  ondansetron (ZOFRAN ODT) 8 MG disintegrating tablet Take 1 tablet (8 mg total) by mouth every 8 (eight) hours as needed for nausea. 12/29/14  Yes Varney Biles, MD  promethazine (PHENERGAN) 25 MG tablet Take 1 tablet (25 mg total) by mouth every 8 (eight) hours as needed for nausea or vomiting. 12/27/14  Yes Dalia Heading, [name]  sevelamer carbonate (RENVELA) 2.4 G PACK Take 2.4 g by mouth daily as needed (lower the amount of phosphorus in the blood).    Yes Historical Provider, MD  Alum & Mag Hydroxide-Simeth (GI COCKTAIL) SUSP suspension Take 30 mLs by mouth 3 (three) times daily as needed for indigestion. Shake well. Patient not taking: Reported on 01/02/2015 01/01/15   Fransico Meadow, [name]  oxyCODONE-acetaminophen (PERCOCET/ROXICET) 5-325 MG per tablet Take 2 tablets by mouth every 4 (four) hours as needed for severe pain. 01/03/15   Ar[place]a Freshour Patel-Mills, [name]   BP 123/73 mmHg  Pulse 88  Temp(Src) 98.7 F (37.1 C) (Oral)  Resp 16  SpO2 100% Physical Exam  Constitutional: He is oriented to person, place, and time. He appears well-developed and well-nourished.  HENT:  Head: Normocephalic and atraumatic.  Eyes: Conjunctivae are normal.  Neck: Normal range of motion. Neck supple.  Cardiovascular: Normal rate, regular rhythm and  normal heart sounds.   Pulmonary/Chest: Effort normal and breath sounds normal. No respiratory distress. He has no wheezes. He has no rales.  Abdominal: Soft. There is tenderness in the epigastric area. There is guarding.    Obese abdomen.  Musculoskeletal: Normal range of motion.  Neurological: He is alert and oriented to person, place, and time.  Skin: Skin is warm and dry.  Nursing note and vitals reviewed.   ED Course  Procedures (including critical care time) Labs Review Labs Reviewed  CBC - Abnormal; Notable for the following:    RBC 3.76 (*)    Hemoglobin 11.1 (*)    HCT 34.0 (*)    All other components within normal limits  BASIC METABOLIC PANEL - Abnormal; Notable for the following:    Potassium 3.3 (*)    Chloride 95 (*)    Creatinine, Ser 6.05 (*)    Calcium 11.1 (*)    GFR calc non Af Amer 11 (*)    GFR calc Af Wyvonnia Lora  13 (*)    All other components within normal limits  LIPASE, BLOOD - Abnormal; Notable for the following:    Lipase 17 (*)    All other components within normal limits  HEPATIC FUNCTION PANEL - Abnormal; Notable for the following:    AST 14 (*)    All other components within normal limits  I-STAT TROPOININ, ED    Imaging Review Dg Chest 2 View  01/02/2015   CLINICAL DATA:  Sudden onset of upper abdominal and chest pain. Shortness of breath.  EXAM: CHEST  2 VIEW  COMPARISON:  Chest radiographs 12/31/2014, 12/29/2014. Multiple priors.  FINDINGS: Lung volumes remain low with crowding of bronchovascular structures. The cardiomediastinal contours are normal. Pulmonary vasculature is normal. No consolidation, pleural effusion, or pneumothorax. No acute osseous abnormalities are seen.  IMPRESSION: Hypoventilatory chest without acute process.   Electronically Signed   By: Jeb Levering M.D.   On: 01/02/2015 22:54    EKG interpretation 01/02/15 21:25 Vent. rate 84 BPM PR interval 169 ms QRS duration 118 ms QT   355 ms P-R-T axes 38 4 34 Sinus rhythm I  interpreted this EKG, Ottie Glazier, [name].  MDM   Final diagnoses:  Pain of upper abdomen  Patient with gastric banding and reversal who is followed by Dr. Lucia Gaskins presents for epigastric abdominal pain that began at 7 PM tonight. He states Dr. Lucia Gaskins is aware of his chronic pain and he has an appointment tomorrow. He is well-known to the ED with similar complaints each time. He has had multiple negative imaging studies and labs. She has had 5 abdominal CTs with no acute abdominal findings since 11/15/2014. He denies any chest pain and is pointing to the epigastric region. He had a full chest pain workup 1 week ago which was negative. Today his chest x-ray is negative for pneumonia, pneumothorax, edema. His troponin is negative and EKG is normal. His labs are comparable to his labs in the past.  I do not believe he needs another CT scan.  He also had an esophagogastroduodenoscopy at the beginning of May 2016. I discussed this patient with Dr. Leonides Schanz. Medications  HYDROmorphone (DILAUDID) injection 1 mg (1 mg Intravenous Given 01/02/15 2333)  ondansetron (ZOFRAN) injection 4 mg (4 mg Intravenous Given 01/02/15 2333)  ondansetron (ZOFRAN) injection 4 mg (4 mg Intravenous Given 01/03/15 0013)  HYDROmorphone (DILAUDID) injection 1 mg (1 mg Intravenous Given 01/03/15 0013)  gi cocktail (Maalox,Lidocaine,Donnatal) (30 mLs Oral Given 01/03/15 0042)  pantoprazole (PROTONIX) EC tablet 20 mg (20 mg Oral Given 01/03/15 0127)  I do not believe this is related to the gallbladder or an SBO or pancreatitis.  This is a recurrent problem that is closely followed by his gastroenterologist.   Upon recheck, the patient's pain has improved and he was given 6 percocet until he sees Dr. Lucia Gaskins tomorrow for his scheduled appointment. He states he has zofran at home.      Ottie Glazier, [name] 01/03/15 [place], DO 01/04/15 [id number]

## 2015-01-03 DIAGNOSIS — R1010 Upper abdominal pain, unspecified: Secondary | ICD-10-CM | POA: Diagnosis not present

## 2015-01-03 MED ORDER — OXYCODONE-ACETAMINOPHEN 5-325 MG PO TABS: 2.0000 | ORAL_TABLET | ORAL | Status: DC | PRN

## 2015-01-03 MED ORDER — HYDROMORPHONE HCL 1 MG/ML IJ SOLN
1.0000 mg | Freq: Once | INTRAMUSCULAR | Status: AC
  Administered 2015-01-03: 1 mg via INTRAVENOUS
  Filled 2015-01-03: qty 1

## 2015-01-03 MED ORDER — GI COCKTAIL ~~LOC~~
30.0000 mL | Freq: Once | ORAL | Status: AC
  Administered 2015-01-03: 30 mL via ORAL
  Filled 2015-01-03: qty 30

## 2015-01-03 MED ORDER — PANTOPRAZOLE SODIUM 20 MG PO TBEC
20.0000 mg | DELAYED_RELEASE_TABLET | Freq: Once | ORAL | Status: AC
  Administered 2015-01-03: 20 mg via ORAL
  Filled 2015-01-03: qty 1

## 2015-01-03 NOTE — Discharge Instructions (Signed)

## 2015-01-04 ENCOUNTER — Encounter (HOSPITAL_COMMUNITY): Payer: Self-pay | Admitting: Surgery

## 2015-01-07 ENCOUNTER — Ambulatory Visit (INDEPENDENT_AMBULATORY_CARE_PROVIDER_SITE_OTHER): Payer: Medicare Other | Admitting: Gastroenterology

## 2015-01-07 ENCOUNTER — Encounter: Payer: Self-pay | Admitting: Gastroenterology

## 2015-01-07 VITALS — BP 130/88 | HR 80 | Ht 65.5 in | Wt 279.1 lb

## 2015-01-07 DIAGNOSIS — G8929 Other chronic pain: Secondary | ICD-10-CM | POA: Diagnosis not present

## 2015-01-07 DIAGNOSIS — R1013 Epigastric pain: Secondary | ICD-10-CM

## 2015-01-07 DIAGNOSIS — R140 Abdominal distension (gaseous): Secondary | ICD-10-CM | POA: Diagnosis not present

## 2015-01-07 DIAGNOSIS — R109 Unspecified abdominal pain: Secondary | ICD-10-CM | POA: Diagnosis not present

## 2015-01-07 NOTE — Assessment & Plan Note (Signed)
Almost two-month history of chronic upper epigastric pain with pressure nausea and vomiting that began post operatively.  Since the workup including CT scans endoscopy and lab have been unrevealing.  Symptoms have not improved following removal of the band.  Etiology is uncertain.  Gastroparesis should be ruled out.  Patient clearly has reflux but I doubt this is etiology for his symptoms.  Recommendations #1 continue Reglan 10 mg one half hour before meals and at bedtime #2 gastric emptying scan (will hold Reglan for 24 hours prior to the scan) #3 continue omeprazole  CC Dr. Lucia Gaskins

## 2015-01-07 NOTE — Progress Notes (Signed)
_                                                                                                                History of Present Illness:  [name] is a 29 year old Afro-American male with morbid obesity, end-stage renal disease on hemodialysis, referred for abdominal pain.  In April, 2016 he underwent a lap band procedure for morbid obesity.  Approximately week after the procedure he developed upper abdominal pressure and discomfort.  Pain is worsened postprandially but may occur throughout the day.  It is only minimally partially relieved with vomiting.  He denies pyrosis.  Preoperatively he had an upper GI series that demonstrated esophageal reflux.  Postprocedure he underwent CT scan 2 that was unrevealing.  Upper endoscopy demonstrated some for plastic gastric polyps.  Pain has persisted despite takedown of the band.  He denies dysphagia.  He has a remote history of pancreatitis.   Past Medical History  Diagnosis Date  . Hyperparathyroidism   . Morbid obesity   . Arthritis   . Complication of anesthesia     A little while to wake up after knee surgery in 2008  . Family history of anesthesia complication     mom slow to wake up  . Headache(784.0)     occasionally  . Dizziness     when coming off of dialysis  . Joint pain   . Joint swelling   . GERD (gastroesophageal reflux disease)     takes Omeprazole as needed  . Hypertension   . Renal disorder     MWF and goes to [facility]  . Renal insufficiency   . PONV (postoperative nausea and vomiting)    Past Surgical History  Procedure Laterality Date  . Av fistula placement Bilateral     Uses Right arm  . Esophagogastroduodenoscopy endoscopy    . Knee arthroscopy Left 2007  . Parathyroidectomy N/A 03/30/2013    Procedure: TOTAL PARATHYROIDECTOMY WITH AUTOTRANSPLANT;  Surgeon: Earnstine Regal, MD;  Location: [place];  Service: General;  Laterality: N/A;  Autotransplant to left lower arm.  . Total knee  arthroplasty Right 03/27/2014    Procedure: UNICOMPARTMENTAL ARTHROPLASTY;  Surgeon: Meredith Pel, MD;  Location: [place];  Service: Orthopedics;  Laterality: Right;  . Breath tek h pylori N/A 05/15/2014    Procedure: BREATH TEK H PYLORI;  Surgeon: Alphonsa Overall, MD;  Location: [facility];  Service: General;  Laterality: N/A;  . Laparoscopic gastric banding N/A 11/12/2014    Procedure: LAPAROSCOPIC GASTRIC BANDING;  Surgeon: Alphonsa Overall, MD;  Location: [facility];  Service: General;  Laterality: N/A;  . Esophagogastroduodenoscopy N/A 12/07/2014    Procedure: ESOPHAGOGASTRODUODENOSCOPY (EGD);  Surgeon: Alphonsa Overall, MD;  Location: [place];  Service: General;  Laterality: N/A;  . Esophagogastroduodenoscopy N/A 12/17/2014    Procedure: ESOPHAGOGASTRODUODENOSCOPY (EGD);  Surgeon: Alphonsa Overall, MD;  Location: [place];  Service: General;  Laterality: N/A;   family history includes Diabetes in his maternal grandmother; Hypertension in his mother; Kidney Stones in  his sister; Liver cancer in his maternal uncle. Current Outpatient Prescriptions  Medication Sig Dispense Refill  . b complex-vitamin c-folic acid (NEPHRO-VITE) 0.8 MG TABS tablet Take 1 tablet by mouth daily.    . calcium carbonate (TUMS - DOSED IN MG ELEMENTAL CALCIUM) 500 MG chewable tablet Chew 2 tablets by mouth 2 (two) times daily as needed for indigestion or heartburn.     . flintstones complete (FLINTSTONES) 60 MG chewable tablet Chew 1 tablet by mouth daily.    . methocarbamol (ROBAXIN) 500 MG tablet Take 1 tablet by mouth as needed.    . metoCLOPramide (REGLAN) 10 MG tablet Take 1 tablet (10 mg total) by mouth every 6 (six) hours. (Patient taking differently: Take 10 mg by mouth every 6 (six) hours as needed for nausea or vomiting. ) 15 tablet 0  . omeprazole (PRILOSEC) 20 MG capsule Take 20 mg by mouth daily as needed (for heartburn).     . ondansetron (ZOFRAN ODT) 8 MG disintegrating tablet Take 1 tablet (8 mg total) by mouth every  8 (eight) hours as needed for nausea. 20 tablet 0  . oxyCODONE-acetaminophen (PERCOCET/ROXICET) 5-325 MG per tablet Take 2 tablets by mouth every 4 (four) hours as needed for severe pain. 6 tablet 0  . promethazine (PHENERGAN) 25 MG tablet Take 1 tablet (25 mg total) by mouth every 8 (eight) hours as needed for nausea or vomiting. 15 tablet 0  . sevelamer carbonate (RENVELA) 2.4 G PACK Take 2.4 g by mouth daily as needed (lower the amount of phosphorus in the blood).      No current facility-administered medications for this visit.   Allergies as of 01/07/2015  . (No Known Allergies)    reports that he quit smoking about 3 [place] ago. His smoking use included Cigarettes. He has a 2.6 pack-year smoking history. He has never used smokeless tobacco. He reports that he does not drink alcohol or use illicit drugs.   Review of Systems: Pertinent positive and negative review of systems were noted in the above HPI section. All other review of systems were otherwise negative.  Vital signs were reviewed in today's medical record Physical Exam: General: Obese male in mild distress secondary to abdominal discomfort Skin: anicteric Head: Normocephalic and atraumatic Eyes:  sclerae anicteric, EOMI Ears: Normal auditory acuity Mouth: No deformity or lesions Neck: Supple, no masses or thyromegaly Lymph Nodes: no lymphadenopathy Lungs: Clear throughout to auscultation Heart: Regular rate and rhythm; no murmurs, rubs or bruits Gastroinestinal: Soft, and non distended. No masses, hepatosplenomegaly or hernias noted. Normal Bowel sounds.  There is mild tenderness to palpation in the midepigastrium.  There are freshly healed surgical scars.  There is no succussion splash Rectal:deferred Musculoskeletal: Symmetrical with no gross deformities  Skin: No lesions on visible extremities Pulses:  Normal pulses noted Extremities: No clubbing, cyanosis, edema or deformities noted Neurological: Alert oriented x 4,  grossly nonfocal Cervical Nodes:  No significant cervical adenopathy Inguinal Nodes: No significant inguinal adenopathy Psychological:  Alert and cooperative. Normal mood and affect  See Assessment and Plan under Problem List

## 2015-01-07 NOTE — Patient Instructions (Addendum)
You have been scheduled for a gastric emptying scan at [facility] on 01/22/2015   at  7:30am. Please arrive at least 15 minutes prior to your appointment for registration. Please make certain not to have anything to eat or drink after midnight the night before your test. Hold all stomach medications (ex: Zofran, phenergan, Reglan) 48 hours prior to your test. If you need to reschedule your appointment, please contact radiology scheduling at [phone]. _____________________________________________________________________ A gastric-emptying study measures how long it takes for food to move through your stomach. There are several ways to measure stomach emptying. In the most common test, you eat food that contains a small amount of radioactive material. A scanner that detects the movement of the radioactive material is placed over your abdomen to monitor the rate at which food leaves your stomach. This test normally takes about 2 hours to complete.   _____________________________________________________________________  We are sending in Reglan to your pharmacy, If you have any side effects like tremors from this medication please discontinue  Hold Reglan 48 hours before your test  Normal BMI (Body Mass Index- based on height and weight) is between 19 and 25. Your BMI today is Body mass index is 45.73 kg/(m^2). Marland Kitchen Please consider follow up  regarding your BMI with your Primary Care Provider.

## 2015-01-08 ENCOUNTER — Ambulatory Visit: Payer: Self-pay | Admitting: Dietician

## 2015-01-11 ENCOUNTER — Emergency Department (HOSPITAL_COMMUNITY): Payer: Medicare Other

## 2015-01-11 ENCOUNTER — Encounter (HOSPITAL_COMMUNITY): Payer: Self-pay | Admitting: *Deleted

## 2015-01-11 ENCOUNTER — Emergency Department (HOSPITAL_COMMUNITY)
Admission: EM | Admit: 2015-01-11 | Discharge: 2015-01-11 | Disposition: A | Discharge status: Home / Self Care | Payer: Medicare Other | Attending: Emergency Medicine | Admitting: Emergency Medicine

## 2015-01-11 DIAGNOSIS — F419 Anxiety disorder, unspecified: Secondary | ICD-10-CM | POA: Insufficient documentation

## 2015-01-11 DIAGNOSIS — M1990 Unspecified osteoarthritis, unspecified site: Secondary | ICD-10-CM | POA: Diagnosis not present

## 2015-01-11 DIAGNOSIS — R1013 Epigastric pain: Secondary | ICD-10-CM

## 2015-01-11 DIAGNOSIS — I10 Essential (primary) hypertension: Secondary | ICD-10-CM | POA: Insufficient documentation

## 2015-01-11 DIAGNOSIS — R1084 Generalized abdominal pain: Secondary | ICD-10-CM | POA: Diagnosis not present

## 2015-01-11 DIAGNOSIS — K219 Gastro-esophageal reflux disease without esophagitis: Secondary | ICD-10-CM | POA: Insufficient documentation

## 2015-01-11 DIAGNOSIS — Z87448 Personal history of other diseases of urinary system: Secondary | ICD-10-CM | POA: Diagnosis not present

## 2015-01-11 DIAGNOSIS — Z87891 Personal history of nicotine dependence: Secondary | ICD-10-CM | POA: Insufficient documentation

## 2015-01-11 DIAGNOSIS — R079 Chest pain, unspecified: Secondary | ICD-10-CM | POA: Diagnosis not present

## 2015-01-11 LAB — COMPREHENSIVE METABOLIC PANEL
ALT: 24 U/L (ref 17–63)
AST: 22 U/L (ref 15–41)
Albumin: 3.6 g/dL (ref 3.5–5.0)
Alkaline Phosphatase: 51 U/L (ref 38–126)
Anion gap: 11 (ref 5–15)
BILIRUBIN TOTAL: 0.9 mg/dL (ref 0.3–1.2)
BUN: 6 mg/dL (ref 6–20)
CO2: 28 mmol/L (ref 22–32)
Calcium: 9.3 mg/dL (ref 8.9–10.3)
Chloride: 99 mmol/L — ABNORMAL LOW (ref 101–111)
Creatinine, Ser: 5.85 mg/dL — ABNORMAL HIGH (ref 0.61–1.24)
GFR calc non Af Amer: 12 mL/min — ABNORMAL LOW (ref >60)
GFR, EST AFRICAN AMERICAN: 14 mL/min — AB (ref >60)
GLUCOSE: 90 mg/dL (ref 65–99)
Potassium: 3 mmol/L — ABNORMAL LOW (ref 3.5–5.1)
Sodium: 138 mmol/L (ref 135–145)
TOTAL PROTEIN: 7 g/dL (ref 6.5–8.1)

## 2015-01-11 LAB — CBC WITH DIFFERENTIAL/PLATELET
BASOS ABS: 0 10*3/uL (ref 0.0–0.1)
Basophils Relative: 0 % (ref 0–1)
Eosinophils Absolute: 0.3 10*3/uL (ref 0.0–0.7)
Eosinophils Relative: 3 % (ref 0–5)
HCT: 34.6 % — ABNORMAL LOW (ref 39.0–52.0)
Hemoglobin: 11.3 g/dL — ABNORMAL LOW (ref 13.0–17.0)
Lymphocytes Relative: 27 % (ref 12–46)
Lymphs Abs: 2.4 10*3/uL (ref 0.7–4.0)
MCH: 29.1 pg (ref 26.0–34.0)
MCHC: 32.7 g/dL (ref 30.0–36.0)
MCV: 89.2 fL (ref 78.0–100.0)
Monocytes Absolute: 0.9 10*3/uL (ref 0.1–1.0)
Monocytes Relative: 11 % (ref 3–12)
Neutro Abs: 5.3 10*3/uL (ref 1.7–7.7)
Neutrophils Relative %: 59 % (ref 43–77)
Platelets: 200 10*3/uL (ref 150–400)
RBC: 3.88 MIL/uL — ABNORMAL LOW (ref 4.22–5.81)
RDW: 15.2 % (ref 11.5–15.5)
WBC: 8.9 10*3/uL (ref 4.0–10.5)

## 2015-01-11 LAB — I-STAT TROPONIN, ED: Troponin i, poc: 0 ng/mL (ref 0.00–0.08)

## 2015-01-11 LAB — LIPASE, BLOOD: LIPASE: 30 U/L (ref 22–51)

## 2015-01-11 MED ORDER — ONDANSETRON HCL 4 MG/2ML IJ SOLN
4.0000 mg | Freq: Once | INTRAMUSCULAR | Status: AC
  Administered 2015-01-11: 4 mg via INTRAVENOUS
  Filled 2015-01-11: qty 2

## 2015-01-11 MED ORDER — METOCLOPRAMIDE HCL 5 MG/ML IJ SOLN
10.0000 mg | Freq: Once | INTRAMUSCULAR | Status: AC
  Administered 2015-01-11: 10 mg via INTRAVENOUS
  Filled 2015-01-11: qty 2

## 2015-01-11 MED ORDER — GI COCKTAIL ~~LOC~~
30.0000 mL | Freq: Once | ORAL | Status: AC
  Administered 2015-01-11: 30 mL via ORAL
  Filled 2015-01-11: qty 30

## 2015-01-11 NOTE — ED Notes (Signed)
Pt stable, ambulatory, states understanding of discharge instructions 

## 2015-01-11 NOTE — ED Notes (Signed)
Pt reports being in dialysis today, acute onset of lower abd pain. Pt unable to finish his treatment. On the way here, began to develop sharp mid chest pains. Denies sob. ekg done at triage.

## 2015-01-11 NOTE — Discharge Instructions (Signed)

## 2015-01-11 NOTE — ED Notes (Signed)
Pt undressed, in gown, on monitor, continuous pulse oximetry and blood pressure cuff 

## 2015-01-11 NOTE — ED Provider Notes (Signed)
CSN: [id number]     Arrival date & time 01/11/15  1620 History   First MD Initiated Contact with Patient 01/11/15 1639     Chief Complaint  Patient presents with  . Chest Pain  . Abdominal Pain     (Consider location/radiation/quality/duration/timing/severity/associated sxs/prior Treatment) Patient is a 28 y.o. male presenting with chest pain and abdominal pain.  Chest Pain Pain location:  Epigastric Pain quality: sharp   Pain radiates to:  Epigastrium Pain radiates to the back: no   Pain severity:  Moderate Onset quality:  Sudden Duration:  2 hours Timing:  Constant Progression:  Unchanged Chronicity:  New Context: at rest   Relieved by:  Nothing Worsened by:  Nothing tried Ineffective treatments:  None tried Associated symptoms: abdominal pain   Risk factors: obesity   Risk factors comment:  Chronic gastritis Abdominal Pain Associated symptoms: chest pain     Past Medical History  Diagnosis Date  . Hyperparathyroidism   . Morbid obesity   . Arthritis   . Complication of anesthesia     A little while to wake up after knee surgery in 2008  . Family history of anesthesia complication     mom slow to wake up  . Headache(784.0)     occasionally  . Dizziness     when coming off of dialysis  . Joint pain   . Joint swelling   . GERD (gastroesophageal reflux disease)     takes Omeprazole as needed  . Hypertension   . Renal disorder     MWF and goes to [facility]  . Renal insufficiency   . PONV (postoperative nausea and vomiting)    Past Surgical History  Procedure Laterality Date  . Av fistula placement Bilateral     Uses Right arm  . Esophagogastroduodenoscopy endoscopy    . Knee arthroscopy Left 2007  . Parathyroidectomy N/A 03/30/2013    Procedure: TOTAL PARATHYROIDECTOMY WITH AUTOTRANSPLANT;  Surgeon: Earnstine Regal, MD;  Location: [place];  Service: General;  Laterality: N/A;  Autotransplant to left lower arm.  . Total knee arthroplasty Right 03/27/2014   Procedure: UNICOMPARTMENTAL ARTHROPLASTY;  Surgeon: Meredith Pel, MD;  Location: [place];  Service: Orthopedics;  Laterality: Right;  . Breath tek h pylori N/A 05/15/2014    Procedure: BREATH TEK H PYLORI;  Surgeon: Alphonsa Overall, MD;  Location: [facility];  Service: General;  Laterality: N/A;  . Laparoscopic gastric banding N/A 11/12/2014    Procedure: LAPAROSCOPIC GASTRIC BANDING;  Surgeon: Alphonsa Overall, MD;  Location: [facility];  Service: General;  Laterality: N/A;  . Esophagogastroduodenoscopy N/A 12/07/2014    Procedure: ESOPHAGOGASTRODUODENOSCOPY (EGD);  Surgeon: Alphonsa Overall, MD;  Location: [place];  Service: General;  Laterality: N/A;  . Esophagogastroduodenoscopy N/A 12/17/2014    Procedure: ESOPHAGOGASTRODUODENOSCOPY (EGD);  Surgeon: Alphonsa Overall, MD;  Location: [facility];  Service: General;  Laterality: N/A;   Family History  Problem Relation Age of Onset  . Hypertension Mother   . Kidney Stones Sister   . Diabetes Maternal Grandmother   . Liver cancer Maternal Uncle    History  Substance Use Topics  . Smoking status: Former Smoker -- 0.20 packs/day for 13 years    Types: Cigarettes    Quit date: 12/17/2014  . Smokeless tobacco: Never Used     Comment: 2 cigs a day  . Alcohol Use: No    Review of Systems  Cardiovascular: Positive for chest pain.  Gastrointestinal: Positive for abdominal pain.  All other systems  reviewed and are negative.     Allergies  Review of patient's allergies indicates no known allergies.  Home Medications   Prior to Admission medications   Medication Sig Start Date End Date Taking? Authorizing Provider  b complex-vitamin c-folic acid (NEPHRO-VITE) 0.8 MG TABS tablet Take 1 tablet by mouth daily. 12/28/14  Yes Historical Provider, MD  calcium carbonate (TUMS - DOSED IN MG ELEMENTAL CALCIUM) 500 MG chewable tablet Chew 2 tablets by mouth 2 (two) times daily as needed for indigestion or heartburn.    Yes Historical Provider, MD  flintstones  complete (FLINTSTONES) 60 MG chewable tablet Chew 1 tablet by mouth daily.   Yes Historical Provider, MD  methocarbamol (ROBAXIN) 500 MG tablet Take 1 tablet by mouth as needed for muscle spasms.  10/29/14  Yes Historical Provider, MD  metoCLOPramide (REGLAN) 10 MG tablet Take 1 tablet (10 mg total) by mouth every 6 (six) hours. Patient taking differently: Take 10 mg by mouth every 6 (six) hours as needed for nausea or vomiting.  01/01/15  Yes Fransico Meadow, [name]  omeprazole (PRILOSEC) 20 MG capsule Take 20 mg by mouth daily as needed (for heartburn).    Yes Historical Provider, MD  ondansetron (ZOFRAN ODT) 8 MG disintegrating tablet Take 1 tablet (8 mg total) by mouth every 8 (eight) hours as needed for nausea. 12/29/14  Yes Varney Biles, MD  oxyCODONE-acetaminophen (PERCOCET/ROXICET) 5-325 MG per tablet Take 2 tablets by mouth every 4 (four) hours as needed for severe pain. 01/03/15  Yes Hanna Patel-Mills, [name]  promethazine (PHENERGAN) 25 MG tablet Take 1 tablet (25 mg total) by mouth every 8 (eight) hours as needed for nausea or vomiting. 12/27/14  Yes Dalia Heading, [name]  sevelamer carbonate (RENVELA) 2.4 G PACK Take 2.4 g by mouth daily as needed (lower the amount of phosphorus in the blood).    Yes Historical Provider, MD   BP 136/87 mmHg  Pulse 77  Temp(Src) 98.9 F (37.2 C) (Oral)  Resp 22  Ht 5\' 6"  (1.676 m)  Wt 278 lb (126.1 kg)  BMI 44.89 kg/m2  SpO2 95% Physical Exam  Constitutional: He is oriented to person, place, and time. He appears well-developed and well-nourished. No distress.  HENT:  Head: Normocephalic and atraumatic.  Eyes: Conjunctivae are normal.  Neck: Neck supple. No tracheal deviation present.  Cardiovascular: Normal rate, regular rhythm and normal heart sounds.   Pulmonary/Chest: Effort normal and breath sounds normal. No respiratory distress.  Abdominal: Soft. Bowel sounds are normal. He exhibits no distension. There is tenderness (mild diffuse, non-focal).  There is no rebound and no guarding.  Neurological: He is alert and oriented to person, place, and time.  Skin: Skin is warm and dry.  Psychiatric: His mood appears anxious (and moaning when provider in room, quiet when room empty).    ED Course  Procedures (including critical care time) Labs Review Labs Reviewed  COMPREHENSIVE METABOLIC PANEL - Abnormal; Notable for the following:    Potassium 3.0 (*)    Chloride 99 (*)    Creatinine, Ser 5.85 (*)    GFR calc non Af Amer 12 (*)    GFR calc Af Amer 14 (*)    All other components within normal limits  CBC WITH DIFFERENTIAL/PLATELET - Abnormal; Notable for the following:    RBC 3.88 (*)    Hemoglobin 11.3 (*)    HCT 34.6 (*)    All other components within normal limits  LIPASE, BLOOD  I-STAT [place], ED  I-STAT  [place], ED    Imaging Review Dg Chest 2 View  01/11/2015   CLINICAL DATA:  Chest pain for 1 day.  Abdominal pain.  EXAM: CHEST  2 VIEW  COMPARISON:  01/02/2015  FINDINGS: The heart size and mediastinal contours are within normal limits. Both lungs are clear. The visualized skeletal structures are unremarkable.  IMPRESSION: No active cardiopulmonary disease.   Electronically Signed   By: Rolm Baptise M.D.   On: 01/11/2015 17:00   Dg Abd 2 Views  01/11/2015   CLINICAL DATA:  Abdominal pain, lap band placement 11/12/2014  EXAM: ABDOMEN - 2 VIEW  COMPARISON:  None.  FINDINGS: The bowel gas pattern is normal. There is no evidence of free air. No radio-opaque calculi or other significant radiographic abnormality is seen.  IMPRESSION: Negative.   Electronically Signed   By: Kathreen Devoid   On: 01/11/2015 17:50     EKG Interpretation None      MDM   Final diagnoses:  Abdominal pain, epigastric   29 year old male presents for evaluation of chronic abdominal pain that exacerbated while he was receiving his normal dialysis treatment. He has not missed any dialysis treatments and states that he has had this pain ongoing since  placement of a lap band 2 months ago which was removed 1 month ago due to ongoing pain symptoms. This pain is typical of prior exacerbations. He has no signs of an acute abdomen currently. He is stating that the pain is sharp and burning radiating into his chest suggestive of reflux. Plain film of the abdomen shows no evidence of free air or obstruction, the patient is not vomiting, and is able to tolerate by mouth fluids without difficulty. He has had four abdominal CT scans in the last 2 months and at this time I do not feel that repeat imaging is warranted given the patient's stable vital signs and clinical stability. Plan to follow up with PCP as needed and return precautions discussed for worsening or new concerning symptoms.     Leo Grosser, MD 01/11/15 [id number]  Pattricia Boss, MD 01/14/15 1308

## 2015-01-22 ENCOUNTER — Ambulatory Visit (HOSPITAL_COMMUNITY)
Admission: RE | Admit: 2015-01-22 | Discharge: 2015-01-22 | Disposition: A | Discharge status: Home / Self Care | Payer: Medicare Other | Source: Ambulatory Visit | Attending: Gastroenterology | Admitting: Gastroenterology

## 2015-01-22 ENCOUNTER — Other Ambulatory Visit: Payer: Self-pay

## 2015-01-22 DIAGNOSIS — R110 Nausea: Secondary | ICD-10-CM | POA: Diagnosis not present

## 2015-01-22 DIAGNOSIS — R140 Abdominal distension (gaseous): Secondary | ICD-10-CM | POA: Insufficient documentation

## 2015-01-22 DIAGNOSIS — R109 Unspecified abdominal pain: Secondary | ICD-10-CM

## 2015-01-22 MED ORDER — HYOSCYAMINE SULFATE ER 0.375 MG PO TBCR: EXTENDED_RELEASE_TABLET | ORAL | Status: DC

## 2015-01-22 MED ORDER — TECHNETIUM TC 99M SULFUR COLLOID
2.2000 | Freq: Once | INTRAVENOUS | Status: AC | PRN
  Administered 2015-01-22: 2.2 via INTRAVENOUS

## 2015-01-25 ENCOUNTER — Telehealth: Payer: Self-pay | Admitting: Gastroenterology

## 2015-01-25 MED ORDER — GLYCOPYRROLATE 2 MG PO TABS: 2.0000 mg | ORAL_TABLET | Freq: Two times a day (BID) | ORAL | Status: DC

## 2015-01-25 NOTE — Telephone Encounter (Signed)
Called in [facility] for patient   Patient aware      Hyosyamine not covered by patients insurance  Sent in [place]  FYI  Dr Deatra Ina

## 2015-03-06 ENCOUNTER — Telehealth: Payer: Self-pay | Admitting: Gastroenterology

## 2015-03-06 NOTE — Telephone Encounter (Signed)
Patient seen in June. "Preoperatively he had an upper GI series that demonstrated esophageal reflux. Postprocedure he underwent CT scan 2 that was unrevealing. Upper endoscopy demonstrated some for plastic gastric polyps. Pain has persisted despite takedown of the band. He denies dysphagia. He has a remote history of pancreatitis." He calls because he has pain as quickly as he swallows. He is taking Prilosec daily. Still belching as if he swallowed air. Please advise.

## 2015-03-07 ENCOUNTER — Other Ambulatory Visit: Payer: Self-pay

## 2015-03-07 DIAGNOSIS — R1013 Epigastric pain: Secondary | ICD-10-CM

## 2015-03-07 NOTE — Telephone Encounter (Signed)
I would repeat the upper GI series

## 2015-03-07 NOTE — Telephone Encounter (Signed)
Patient advised to go to [facility] 03/14/15 arriving at 10:45 and fast after 8:00 am. Patient expresses understanding of these instructions.

## 2015-03-14 ENCOUNTER — Ambulatory Visit (HOSPITAL_COMMUNITY)
Admission: RE | Admit: 2015-03-14 | Discharge: 2015-03-14 | Disposition: A | Discharge status: Home / Self Care | Payer: Medicare Other | Source: Ambulatory Visit | Attending: Gastroenterology | Admitting: Gastroenterology

## 2015-03-14 DIAGNOSIS — K219 Gastro-esophageal reflux disease without esophagitis: Secondary | ICD-10-CM | POA: Diagnosis not present

## 2015-03-14 DIAGNOSIS — R1013 Epigastric pain: Secondary | ICD-10-CM | POA: Diagnosis present

## 2015-03-14 DIAGNOSIS — K224 Dyskinesia of esophagus: Secondary | ICD-10-CM | POA: Insufficient documentation

## 2015-04-09 ENCOUNTER — Ambulatory Visit: Payer: Self-pay | Admitting: Gastroenterology

## 2015-06-06 ENCOUNTER — Encounter: Payer: Self-pay | Admitting: Physical Medicine & Rehabilitation

## 2015-07-11 ENCOUNTER — Encounter: Payer: Medicare Other | Attending: Physical Medicine & Rehabilitation

## 2015-07-11 ENCOUNTER — Ambulatory Visit (HOSPITAL_BASED_OUTPATIENT_CLINIC_OR_DEPARTMENT_OTHER): Payer: Medicare Other | Admitting: Physical Medicine & Rehabilitation

## 2015-07-11 ENCOUNTER — Encounter: Payer: Self-pay | Admitting: Physical Medicine & Rehabilitation

## 2015-07-11 VITALS — BP 92/55 | HR 79 | Wt 285.0 lb

## 2015-07-11 DIAGNOSIS — R531 Weakness: Secondary | ICD-10-CM | POA: Diagnosis not present

## 2015-07-11 DIAGNOSIS — N186 End stage renal disease: Secondary | ICD-10-CM | POA: Insufficient documentation

## 2015-07-11 DIAGNOSIS — M1990 Unspecified osteoarthritis, unspecified site: Secondary | ICD-10-CM | POA: Diagnosis not present

## 2015-07-11 DIAGNOSIS — R51 Headache: Secondary | ICD-10-CM | POA: Insufficient documentation

## 2015-07-11 DIAGNOSIS — M1712 Unilateral primary osteoarthritis, left knee: Secondary | ICD-10-CM | POA: Insufficient documentation

## 2015-07-11 DIAGNOSIS — R42 Dizziness and giddiness: Secondary | ICD-10-CM | POA: Insufficient documentation

## 2015-07-11 DIAGNOSIS — M179 Osteoarthritis of knee, unspecified: Secondary | ICD-10-CM | POA: Insufficient documentation

## 2015-07-11 DIAGNOSIS — Z6841 Body Mass Index (BMI) 40.0 and over, adult: Secondary | ICD-10-CM | POA: Diagnosis not present

## 2015-07-11 DIAGNOSIS — K219 Gastro-esophageal reflux disease without esophagitis: Secondary | ICD-10-CM | POA: Diagnosis not present

## 2015-07-11 DIAGNOSIS — I10 Essential (primary) hypertension: Secondary | ICD-10-CM | POA: Diagnosis not present

## 2015-07-11 DIAGNOSIS — E213 Hyperparathyroidism, unspecified: Secondary | ICD-10-CM | POA: Insufficient documentation

## 2015-07-11 NOTE — Patient Instructions (Signed)
Procedure is Genicular nerve  Block left knee Goal is for a 50% or greater relief even if it is short-term Then we would repeat the block and if another good short-term relief we would proceed to either heating the nerves or freezing the nerves

## 2015-07-11 NOTE — Progress Notes (Signed)
Subjective:    Patient ID: [name], male    DOB: October 27, 1985, 29 y.o.   MRN: [id number]  HPI Chief complaint is left knee pain  29 year old male with history of morbid obesity as well as end-stage renal disease due to hypertension, who has had a several year history of bilateral knee pain. He underwent uni compartmental arthroplasty right lateral compartment 03/27/2014.  The patient did well with this procedure but would like to avoid another procedure given his multiple medical issues. He has seen Dr. Marlou Sa from orthopedics. He has had several corticosteroid injections which have only produced moderate short-term relief. He has been on pain medications as well. He's had repeat MRI earlier this years with results as noted below. He would like to discuss other potential pain relieving procedures to help with his chronic left lateral knee pain Pain Inventory Average Pain 8 Pain Right Now 9 My pain is constant, sharp and aching  In the last 24 hours, has pain interfered with the following? General activity 3 Relation with others 7 Enjoyment of life 7 What TIME of day is your pain at its worst? Morning, Daytime, Evening and Night Sleep (in general) Fair  Pain is worse with: walking, standing and some activites Pain improves with: NA Relief from Meds: 7  Mobility walk without assistance how many minutes can you walk? 10 ability to climb steps?  yes do you drive?  yes transfers alone  Function disabled: date disabled 2005  Neuro/Psych weakness  Prior Studies Any changes since last visit?  no CLINICAL DATA: Left knee pain for 3 Bellin with popping. The left knee kiss weight.  EXAM: MRI OF THE LEFT KNEE WITHOUT CONTRAST  TECHNIQUE: Multiplanar, multisequence MR imaging of the knee was performed. No intravenous contrast was administered.  COMPARISON: Plain films left knee 10/10/2009. MRI left knee 05/06/2006.  FINDINGS: MENISCI  Medial meniscus:  Intact.  Lateral meniscus: The anterior horn of the lateral meniscus is not visualized consistent with degenerative maceration. Marked blunting is seen in the anterior 1/2 of the body of the lateral meniscus consistent with degenerative radial tearing. The appearance of the lateral meniscus has worsened since the prior MRI.  LIGAMENTS  Cruciates: Intact.  Collaterals: Intact.  CARTILAGE  Patellofemoral: Unremarkable.  Medial: Unremarkable.  Lateral: There has been progression of hyaline cartilage loss about the lateral compartment. Hyaline cartilage along the weight-bearing surface of the lateral femoral condyle and throughout the lateral tibial plateau is denuded. There is associated subchondral cyst formation. The articular surface of the lateral femoral condyle has an abnormal flattened configuration.  Joint: Small joint effusion is present.  Popliteal Fossa: Very small Baker cyst is identified.  Extensor Mechanism: Intact.  Bones: Prominent osteophytes are seen about the lateral compartment. No fracture or stress change.  IMPRESSION: Since the prior MRI, there has been worsening of markedly advanced for age lateral compartment osteoarthritis. Associated degenerative maceration of the anterior horn of the lateral meniscus is identified with radial tearing along the anterior 1/2 of the body of the lateral meniscus. Physicians involved in your care Any changes since last visit?  no   Family History  Problem Relation Age of Onset  . Hypertension Mother   . Kidney Stones Sister   . Diabetes Maternal Grandmother   . Liver cancer Maternal Uncle   . Diabetes Father    Social History   Social History  . Marital Status: Single    Spouse Name: N/A  . Number of Children: 0  .  Years of Education: N/A   Social History Main Topics  . Smoking status: Former Smoker -- 0.20 packs/day for 13 years    Types: Cigarettes    Quit date: 12/17/2014  .  Smokeless tobacco: Never Used     Comment: 2 cigs a day  . Alcohol Use: No  . Drug Use: No  . Sexual Activity: Not Asked   Other Topics Concern  . None   Social History Narrative   Past Surgical History  Procedure Laterality Date  . Av fistula placement Bilateral 2005,2007    Uses Right arm  . Esophagogastroduodenoscopy endoscopy    . Knee arthroscopy Left 2007  . Parathyroidectomy N/A 03/30/2013    Procedure: TOTAL PARATHYROIDECTOMY WITH AUTOTRANSPLANT;  Surgeon: Earnstine Regal, MD;  Location: [place];  Service: General;  Laterality: N/A;  Autotransplant to left lower arm.  . Total knee arthroplasty Right 03/27/2014    Procedure: UNICOMPARTMENTAL ARTHROPLASTY;  Surgeon: Meredith Pel, MD;  Location: [place];  Service: Orthopedics;  Laterality: Right;  . Breath tek h pylori N/A 05/15/2014    Procedure: BREATH TEK H PYLORI;  Surgeon: Alphonsa Overall, MD;  Location: [facility];  Service: General;  Laterality: N/A;  . Laparoscopic gastric banding N/A 11/12/2014    Procedure: LAPAROSCOPIC GASTRIC BANDING;  Surgeon: Alphonsa Overall, MD;  Location: [facility];  Service: General;  Laterality: N/A;  . Esophagogastroduodenoscopy N/A 12/07/2014    Procedure: ESOPHAGOGASTRODUODENOSCOPY (EGD);  Surgeon: Alphonsa Overall, MD;  Location: [place];  Service: General;  Laterality: N/A;  . Esophagogastroduodenoscopy N/A 12/17/2014    Procedure: ESOPHAGOGASTRODUODENOSCOPY (EGD);  Surgeon: Alphonsa Overall, MD;  Location: [place];  Service: General;  Laterality: N/A;   Past Medical History  Diagnosis Date  . Hyperparathyroidism ([place])   . Morbid obesity ([place])   . Arthritis   . Complication of anesthesia     A little while to wake up after knee surgery in 2008  . Family history of anesthesia complication     mom slow to wake up  . Headache(784.0)     occasionally  . Dizziness     when coming off of dialysis  . Joint pain   . Joint swelling   . GERD (gastroesophageal reflux disease)     takes Omeprazole as  needed  . Hypertension   . Renal disorder     MWF and goes to [facility]  . Renal insufficiency   . PONV (postoperative nausea and vomiting)    BP 92/55 mmHg  Pulse 79  Wt 285 lb (129.275 kg)  SpO2 97%  Opioid Risk Score:   Fall Risk Score:  `1  Depression screen PHQ 2/9  Depression screen [facility] 2/9 07/11/2015 11/07/2014  Decreased Interest 2 0  Down, Depressed, Hopeless 1 0  PHQ - 2 Score 3 0  Altered sleeping 2 -  Tired, decreased energy 1 -  Change in appetite 3 -  Feeling bad or failure about yourself  2 -  Trouble concentrating 2 -  Moving slowly or fidgety/restless 1 -  Suicidal thoughts 0 -  PHQ-9 Score 14 -  Difficult doing work/chores Somewhat difficult -     Review of Systems  Constitutional: Positive for unexpected weight change.  Neurological: Positive for weakness.  All other systems reviewed and are negative.      Objective:   Physical Exam  Constitutional: He is oriented to person, place, and time. He appears well-developed.  obese  HENT:  Head: Normocephalic and atraumatic.  Eyes: Conjunctivae are  normal. Pupils are equal, round, and reactive to light.  Neck: Normal range of motion.  Cardiovascular: Normal rate and regular rhythm.   Pulmonary/Chest: Effort normal and breath sounds normal.  Abdominal: Soft.  Neurological: He is alert and oriented to person, place, and time.  Psychiatric: He has a normal mood and affect.  Nursing note and vitals reviewed.  Left knee with severe valgus deformity. There is no left knee effusion. There is mild tenderness along the medial joint line or a tenderness along the lateral joint line on the left side. Patient has full knee extension Knee flexion is 105 No pain with left hip range of motion Negative straight leg raising Normal sensation in the left lower extremity       Assessment & Plan:  1. Left knee osteoarthritis lateral compartment With lateral meniscus tears as noted in MRI February 2016. He is  trying to avoid surgery and is looking for other pain relieving procedures. We discussed 2 possibilities  A. Left genicular nerve blocks under fluoroscopic guidance. If this is helpful then it would be repeated to confirm and if once again the patient has temporary benefit he would be a candidate for either radiofrequency ablation or cryoablation.  B. Cryoablation of the left anterior femoral cutaneous nerve and the left infrapatellar branch of the saphenous nerve.  We discussed the risks and benefits of these procedures including the possibility of a may not be helpful. He would like to proceed with this. We discussed that the genicular nerve blocks are more likely to be helpful,given the distribution of his pain complaints.

## 2015-08-13 ENCOUNTER — Ambulatory Visit: Payer: Medicare Other | Admitting: Physical Medicine & Rehabilitation

## 2015-08-20 ENCOUNTER — Ambulatory Visit: Payer: Medicare Other | Admitting: Physical Medicine & Rehabilitation

## 2015-08-20 ENCOUNTER — Encounter: Payer: Medicare Other | Attending: Physical Medicine & Rehabilitation

## 2015-08-20 DIAGNOSIS — K219 Gastro-esophageal reflux disease without esophagitis: Secondary | ICD-10-CM | POA: Insufficient documentation

## 2015-08-20 DIAGNOSIS — I10 Essential (primary) hypertension: Secondary | ICD-10-CM | POA: Insufficient documentation

## 2015-08-20 DIAGNOSIS — Z6841 Body Mass Index (BMI) 40.0 and over, adult: Secondary | ICD-10-CM | POA: Insufficient documentation

## 2015-08-20 DIAGNOSIS — N186 End stage renal disease: Secondary | ICD-10-CM | POA: Insufficient documentation

## 2015-08-20 DIAGNOSIS — R531 Weakness: Secondary | ICD-10-CM | POA: Insufficient documentation

## 2015-08-20 DIAGNOSIS — M1990 Unspecified osteoarthritis, unspecified site: Secondary | ICD-10-CM | POA: Insufficient documentation

## 2015-08-20 DIAGNOSIS — R51 Headache: Secondary | ICD-10-CM | POA: Insufficient documentation

## 2015-08-20 DIAGNOSIS — R42 Dizziness and giddiness: Secondary | ICD-10-CM | POA: Insufficient documentation

## 2015-08-20 DIAGNOSIS — E213 Hyperparathyroidism, unspecified: Secondary | ICD-10-CM | POA: Insufficient documentation

## 2015-08-20 DIAGNOSIS — M179 Osteoarthritis of knee, unspecified: Secondary | ICD-10-CM | POA: Insufficient documentation

## 2015-08-27 ENCOUNTER — Encounter: Payer: Self-pay | Admitting: Physical Medicine & Rehabilitation

## 2015-08-27 ENCOUNTER — Ambulatory Visit (HOSPITAL_BASED_OUTPATIENT_CLINIC_OR_DEPARTMENT_OTHER): Payer: Medicare Other | Admitting: Physical Medicine & Rehabilitation

## 2015-08-27 VITALS — BP 101/46 | HR 78 | Resp 14

## 2015-08-27 DIAGNOSIS — R42 Dizziness and giddiness: Secondary | ICD-10-CM | POA: Diagnosis not present

## 2015-08-27 DIAGNOSIS — Z6841 Body Mass Index (BMI) 40.0 and over, adult: Secondary | ICD-10-CM | POA: Diagnosis not present

## 2015-08-27 DIAGNOSIS — G8929 Other chronic pain: Secondary | ICD-10-CM

## 2015-08-27 DIAGNOSIS — R531 Weakness: Secondary | ICD-10-CM | POA: Diagnosis not present

## 2015-08-27 DIAGNOSIS — R51 Headache: Secondary | ICD-10-CM | POA: Diagnosis not present

## 2015-08-27 DIAGNOSIS — E213 Hyperparathyroidism, unspecified: Secondary | ICD-10-CM | POA: Diagnosis not present

## 2015-08-27 DIAGNOSIS — K219 Gastro-esophageal reflux disease without esophagitis: Secondary | ICD-10-CM | POA: Diagnosis not present

## 2015-08-27 DIAGNOSIS — I10 Essential (primary) hypertension: Secondary | ICD-10-CM | POA: Diagnosis not present

## 2015-08-27 DIAGNOSIS — M25562 Pain in left knee: Secondary | ICD-10-CM

## 2015-08-27 DIAGNOSIS — M1990 Unspecified osteoarthritis, unspecified site: Secondary | ICD-10-CM | POA: Diagnosis not present

## 2015-08-27 DIAGNOSIS — M179 Osteoarthritis of knee, unspecified: Secondary | ICD-10-CM | POA: Diagnosis not present

## 2015-08-27 DIAGNOSIS — N186 End stage renal disease: Secondary | ICD-10-CM | POA: Diagnosis not present

## 2015-08-27 MED ORDER — DIAZEPAM 10 MG PO TABS
10.0000 mg | ORAL_TABLET | Freq: Four times a day (QID) | ORAL | Status: DC | PRN
Start: 2015-08-27 — End: 2015-10-01

## 2015-08-27 NOTE — Progress Notes (Signed)
  [facility]   Name: [name] DOB:March 13, 1986 MRN: [id number]  Date:08/27/2015  Physician: Alysia Penna, MD    Nurse/CMA: Gara Kroner  Allergies: No Known Allergies  Consent Signed: Yes.    Is patient diabetic? No.  CBG today? N/A  Pregnant: No. LMP: No LMP for male patient. (age 31-55)  Anticoagulants: no Anti-inflammatory: no Antibiotics: no  Procedure: Genicular Nerve Block-Left knee  Position: Supine Start Time: 1341 End Time: 1358  Fluoro Time: 39  RN/CMA [name]    Time 1320 1403    BP 101/46 111/63    Pulse 78 69    Respirations 14 14    O2 Sat 98 96    S/S 6 6    Pain Level 7/10 1/10     D/C home with N/A, patient A & O X 3, D/C instructions reviewed, and sits independently.

## 2015-08-27 NOTE — Patient Instructions (Signed)
Test Drive this injection, if it is helpful and wears off we will repeat it. You will take Valium 10 mg prior to the injection next time. If this is not helpful would proceed to freezing of the nerves around the knee. Once again you'll need to take Valium 10 mg prior to that procedure.

## 2015-08-27 NOTE — Progress Notes (Signed)
Left Knee Genicular nerve block x 3, Upper medial, Upper lateral , and Lower Medial under fluoroscopic guidance  Indication Chronic end-stage osteoarthritis of the knee which has not responded to conservative management such as physical therapy and medication management  Informed consent was obtained after describing risks and to the procedure to the patient these include bleeding bruising and infection, patient elects to proceed and has given written consent. Patient placed supine on the fluoroscopy table AP images of the knee joint were obtained. A 25-gauge 1.5 inch needle was used to anesthetize the skin and subcutaneous tissue with 1% lidocaine, 1 cc at each of 3 locations. Then a 22-gauge 3.5" spinal needle was inserted targeting the junction of the medial flare of the tibia with the shaft of the tibia, bone contact made and confirmed with lateral imaging. Then Omnipaque 180 x0.5 mL demonstrated no intravascular uptake followed by injection of a solution containing 1 cc of 6 mg per cc Celestone and 5 cc of 1% lidocaine. Then the junction of the medial epicondyles of the femur with the femoral shaft was targeted needle was advanced under fluoroscopic guidance until bone contact. Appropriate depth was obtained and confirmed with lateral images. Then Omnipaque 180 x0.5 mL demonstrated no intravascular uptake followed by injection of 2 cc of the Celestone lidocaine solution. Then the junction of the lateral femoral condyle with the femoral shaft was targeted. 22-gauge 3.5 inch needle was advanced under fluoroscopic guidance until bone contact. Appropriate depth was confirmed with lateral imaging. 0.5 mL of Omnipaque 180 injected followed by injection of 2 cc of the Celestone lidocaine solution. Patient tolerated procedure well. Post procedure instructions given  Pre-injection pain 7/10 Postinjection pain 1/10

## 2015-08-30 IMAGING — CR DG ABDOMEN ACUTE W/ 1V CHEST
4 series · 4 of 4 positions shown · non-contrast
Comparison: CT of the abdomen and pelvis on 12/10/2014, chest x-ray
on 12/05/2014

CLINICAL DATA: Lap band inserted 1 month ago and removed 8 days
ago. Waves of sharp abdominal pain. Left upper quadrant pain started
on [REDACTED]. Epigastric pain began yesterday. Worse with movement.

EXAM:
DG ABDOMEN ACUTE W/ 1V CHEST

[chest pa]
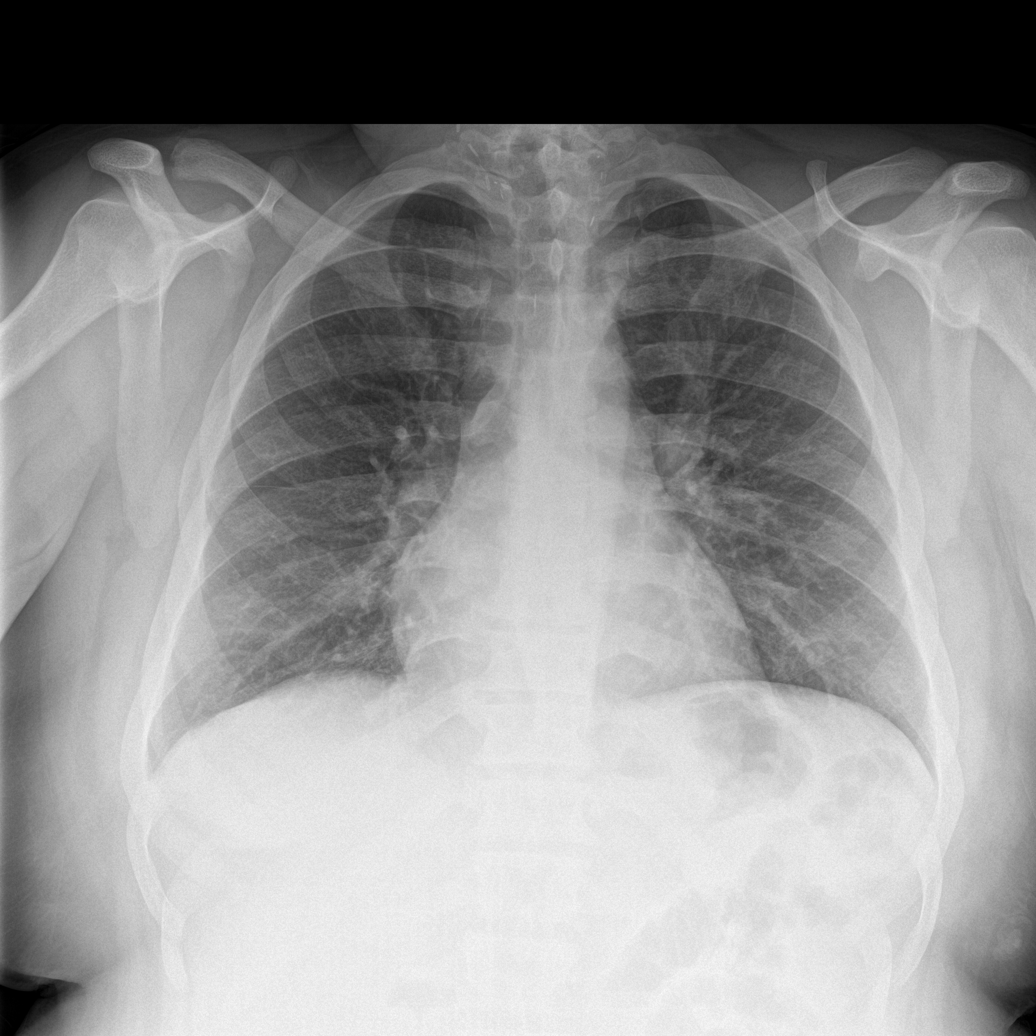

[abdomen erect]
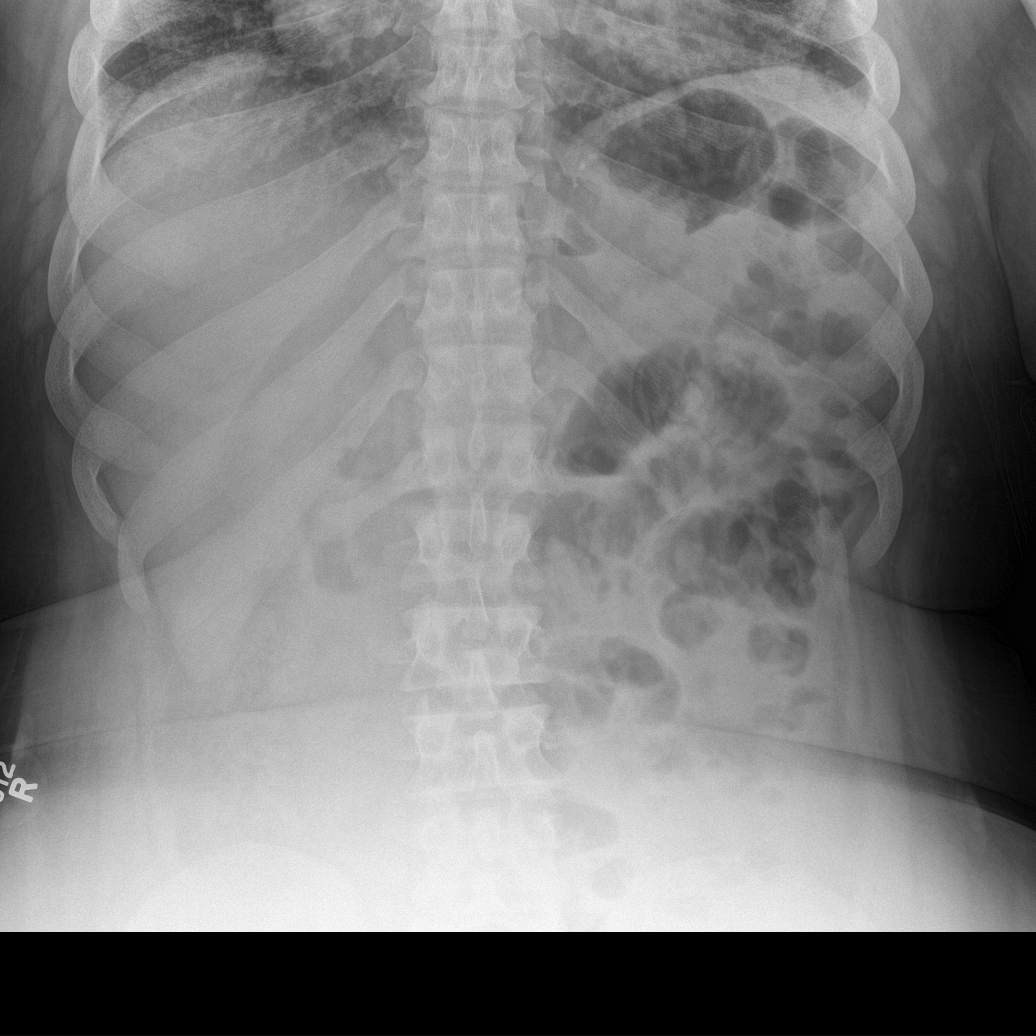

[abdomen supine (1 of 2)]
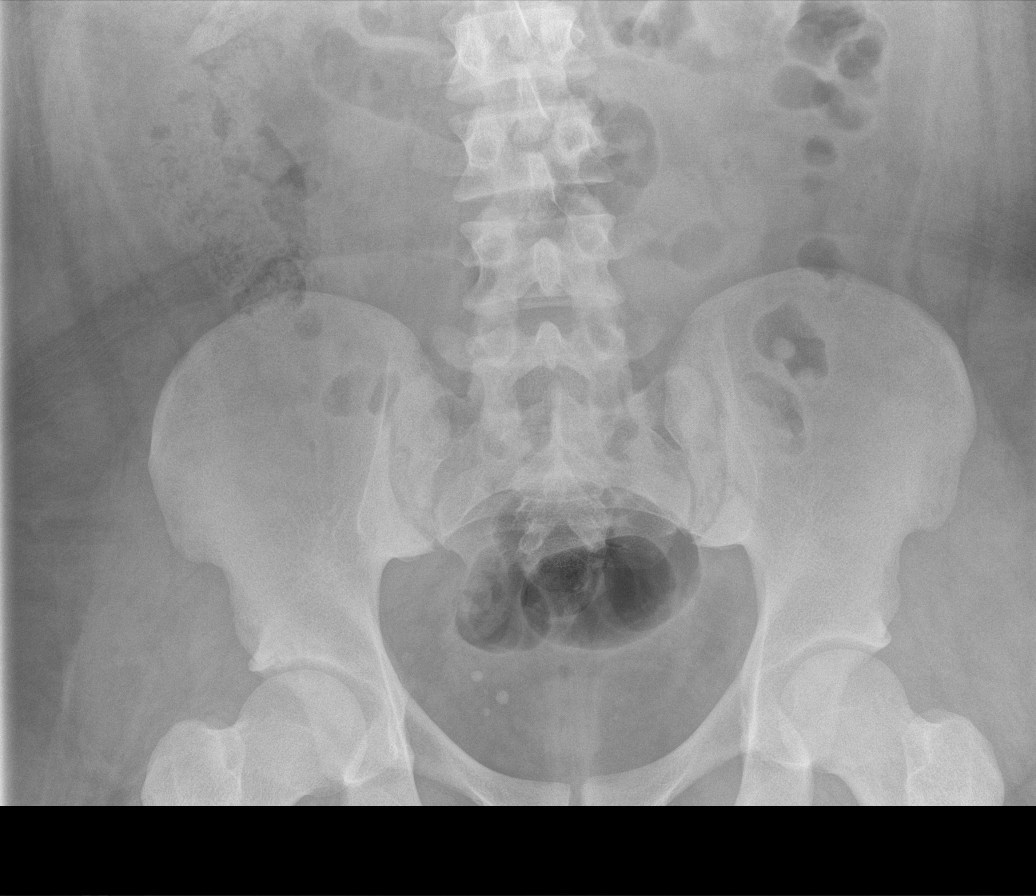

[abdomen supine (2 of 2)]
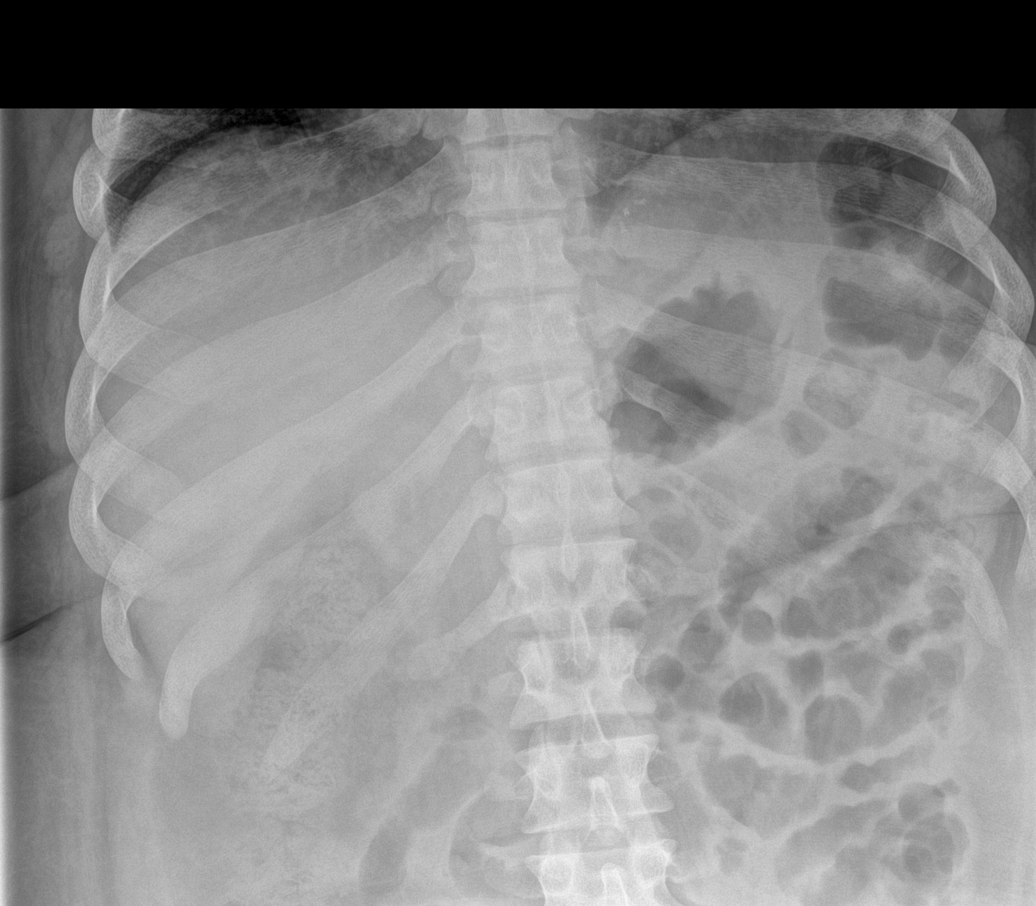

[4 of 4 positions shown; findings below may reference images not displayed]

FINDINGS: Heart size is mildly enlarged. There are changes of mild
interstitial edema. There are no focal consolidations.

No free intraperitoneal air. There is mild dilatation of central
small bowel loops. Colonic loops are not dilated. Visualized osseous
structures have a normal appearance. No evidence for organomegaly.
IMPRESSION: 1. Mild cardiomegaly and interstitial edema.
2. Small bowel dilatation consistent with early or partial small
bowel obstruction versus focal ileus.
3. No free intraperitoneal air.

## 2015-09-14 IMAGING — DX DG ABDOMEN 2V
3 series · 3 of 3 positions shown · non-contrast
Comparison: None.

CLINICAL DATA: Abdominal pain, lap band placement 11/12/2014

EXAM:
ABDOMEN - 2 VIEW

[abdomen erect]
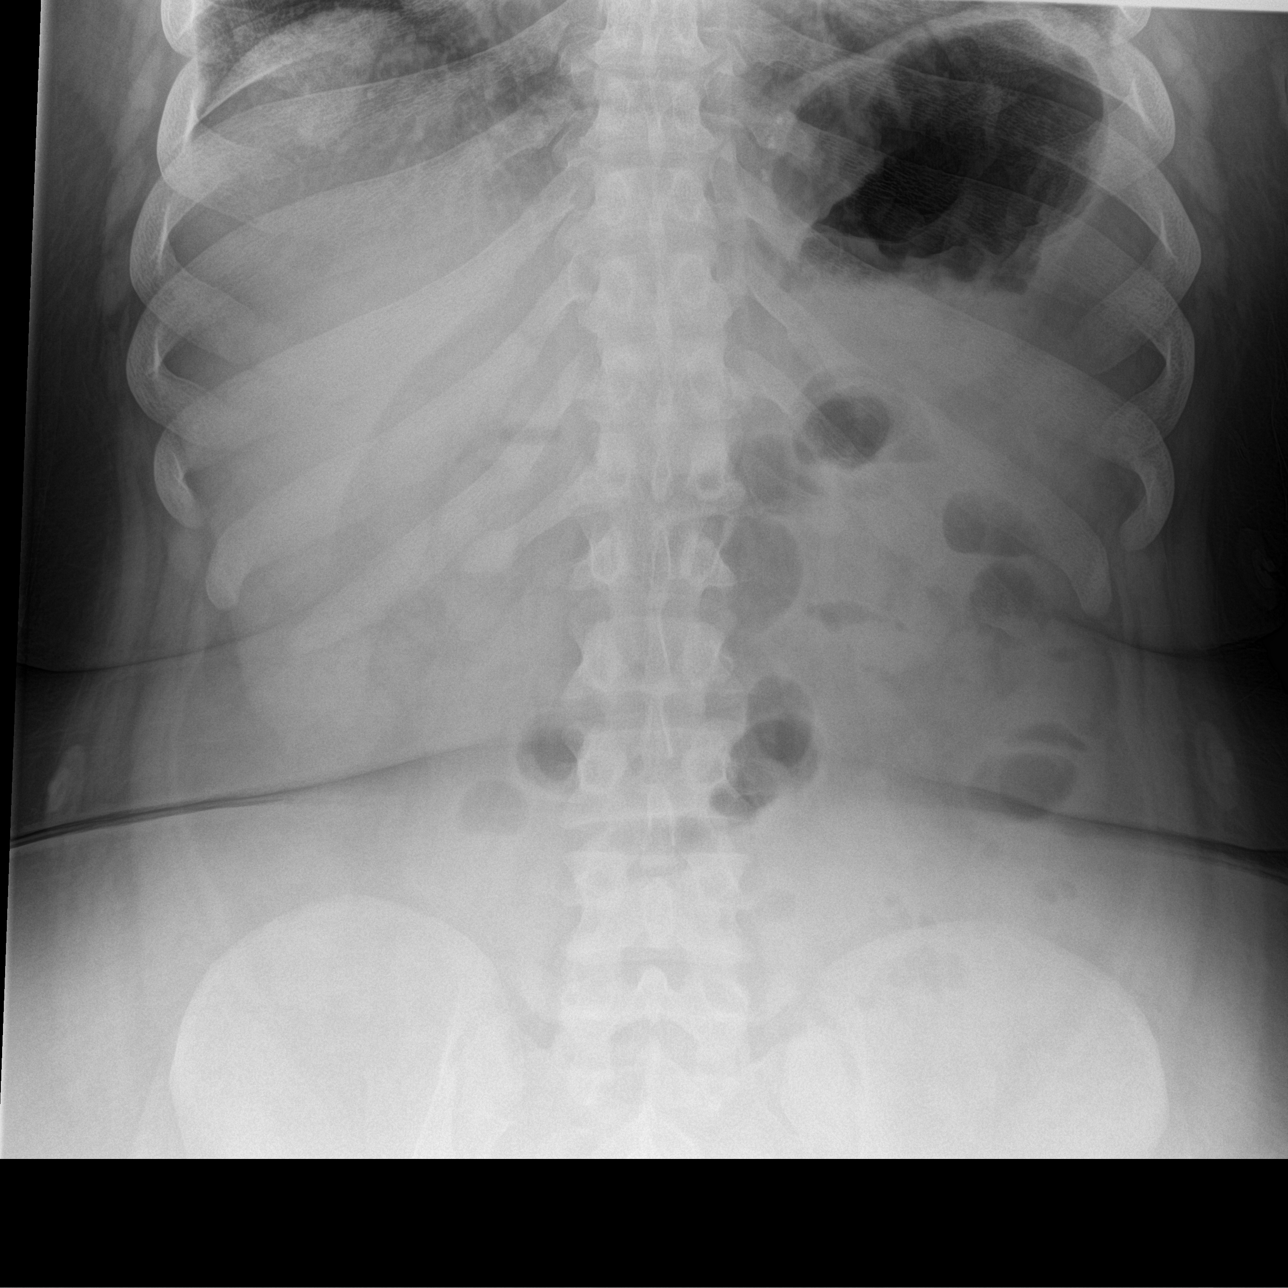

[abdomen supine (1 of 2)]
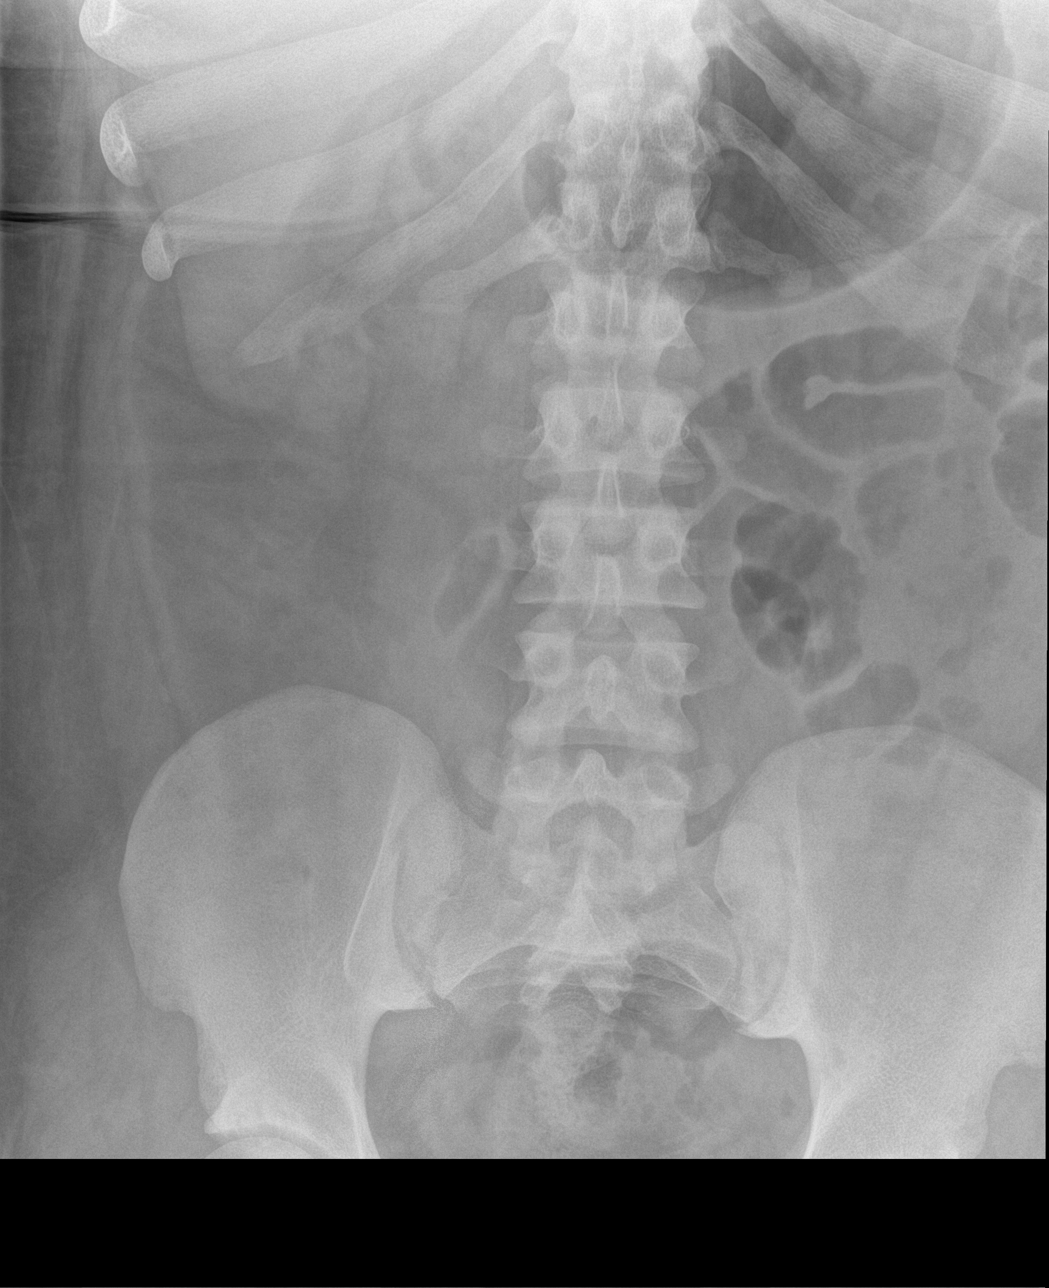

[abdomen supine (2 of 2)]
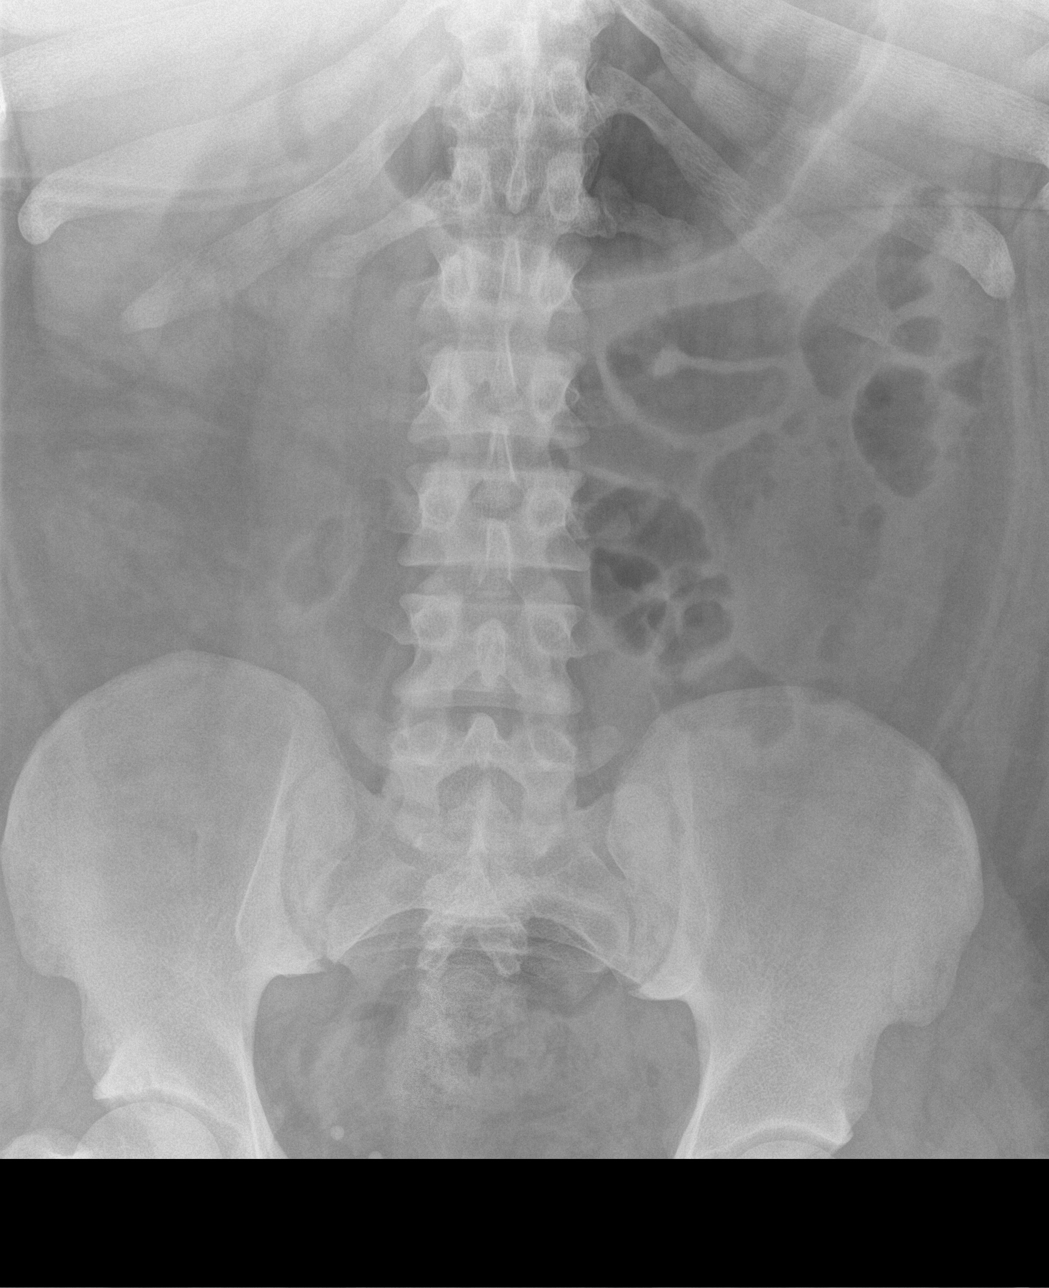

[3 of 3 positions shown; findings below may reference images not displayed]

FINDINGS: The bowel gas pattern is normal. There is no evidence of free air.
No radio-opaque calculi or other significant radiographic
abnormality is seen.
IMPRESSION: Negative.

## 2015-09-24 ENCOUNTER — Encounter: Payer: Medicare Other | Attending: Physical Medicine & Rehabilitation

## 2015-09-24 ENCOUNTER — Ambulatory Visit: Payer: Self-pay | Admitting: Physical Medicine & Rehabilitation

## 2015-09-24 DIAGNOSIS — I10 Essential (primary) hypertension: Secondary | ICD-10-CM | POA: Insufficient documentation

## 2015-09-24 DIAGNOSIS — R51 Headache: Secondary | ICD-10-CM | POA: Insufficient documentation

## 2015-09-24 DIAGNOSIS — M1990 Unspecified osteoarthritis, unspecified site: Secondary | ICD-10-CM | POA: Insufficient documentation

## 2015-09-24 DIAGNOSIS — R42 Dizziness and giddiness: Secondary | ICD-10-CM | POA: Insufficient documentation

## 2015-09-24 DIAGNOSIS — M179 Osteoarthritis of knee, unspecified: Secondary | ICD-10-CM | POA: Insufficient documentation

## 2015-09-24 DIAGNOSIS — R531 Weakness: Secondary | ICD-10-CM | POA: Insufficient documentation

## 2015-09-24 DIAGNOSIS — K219 Gastro-esophageal reflux disease without esophagitis: Secondary | ICD-10-CM | POA: Insufficient documentation

## 2015-09-24 DIAGNOSIS — Z6841 Body Mass Index (BMI) 40.0 and over, adult: Secondary | ICD-10-CM | POA: Insufficient documentation

## 2015-09-24 DIAGNOSIS — N186 End stage renal disease: Secondary | ICD-10-CM | POA: Insufficient documentation

## 2015-09-24 DIAGNOSIS — E213 Hyperparathyroidism, unspecified: Secondary | ICD-10-CM | POA: Insufficient documentation

## 2015-10-01 ENCOUNTER — Ambulatory Visit (HOSPITAL_BASED_OUTPATIENT_CLINIC_OR_DEPARTMENT_OTHER): Payer: Medicare Other | Admitting: Physical Medicine & Rehabilitation

## 2015-10-01 ENCOUNTER — Encounter: Payer: Self-pay | Admitting: Physical Medicine & Rehabilitation

## 2015-10-01 VITALS — BP 86/48 | HR 87 | Resp 14

## 2015-10-01 DIAGNOSIS — Z6841 Body Mass Index (BMI) 40.0 and over, adult: Secondary | ICD-10-CM | POA: Diagnosis not present

## 2015-10-01 DIAGNOSIS — K219 Gastro-esophageal reflux disease without esophagitis: Secondary | ICD-10-CM | POA: Diagnosis not present

## 2015-10-01 DIAGNOSIS — R42 Dizziness and giddiness: Secondary | ICD-10-CM | POA: Diagnosis not present

## 2015-10-01 DIAGNOSIS — I10 Essential (primary) hypertension: Secondary | ICD-10-CM | POA: Diagnosis not present

## 2015-10-01 DIAGNOSIS — M1990 Unspecified osteoarthritis, unspecified site: Secondary | ICD-10-CM | POA: Diagnosis not present

## 2015-10-01 DIAGNOSIS — R51 Headache: Secondary | ICD-10-CM | POA: Diagnosis not present

## 2015-10-01 DIAGNOSIS — E213 Hyperparathyroidism, unspecified: Secondary | ICD-10-CM | POA: Diagnosis not present

## 2015-10-01 DIAGNOSIS — M179 Osteoarthritis of knee, unspecified: Secondary | ICD-10-CM

## 2015-10-01 DIAGNOSIS — R531 Weakness: Secondary | ICD-10-CM | POA: Diagnosis not present

## 2015-10-01 DIAGNOSIS — M1712 Unilateral primary osteoarthritis, left knee: Secondary | ICD-10-CM

## 2015-10-01 DIAGNOSIS — N186 End stage renal disease: Secondary | ICD-10-CM | POA: Diagnosis not present

## 2015-10-01 MED ORDER — DIAZEPAM 10 MG PO TABS: 10.0000 mg | ORAL_TABLET | Freq: Four times a day (QID) | ORAL | Status: DC | PRN

## 2015-10-01 NOTE — Progress Notes (Signed)
Subjective:    Patient ID: [name], male    DOB: [birth year], 30 y.o.   MRN: [id number]  HPI  Genicular nerve blocks Left knee Pre injection pain 7/10 Post injection 1/10  Current 2-3 /10  Pain Inventory Average Pain 6 Pain Right Now 3 My pain is sharp, dull and aching  In the last 24 hours, has pain interfered with the following? General activity 3 Relation with others 4 Enjoyment of life 2 What TIME of day is your pain at its worst? night Sleep (in general) Good  Pain is worse with: walking, inactivity and some activites Pain improves with: rest, heat/ice and medication Relief from Meds: 9  Mobility walk without assistance how many minutes can you walk? 30 ability to climb steps?  yes do you drive?  yes transfers alone Do you have any goals in this area?  yes  Function not employed: date last employed 2007 disabled: date disabled 2007 Do you have any goals in this area?  yes  Neuro/Psych No problems in this area  Prior Studies Any changes since last visit?  no  Physicians involved in your care Any changes since last visit?  no   Family History  Problem Relation Age of Onset  . Hypertension Mother   . Kidney Stones Sister   . Diabetes Maternal Grandmother   . Liver cancer Maternal Uncle   . Diabetes Father    Social History   Social History  . Marital Status: Single    Spouse Name: N/A  . Number of Children: 0  . Years of Education: N/A   Social History Main Topics  . Smoking status: Former Smoker -- 0.20 packs/day for 13 years    Types: Cigarettes    Quit date: 12/17/2014  . Smokeless tobacco: Never Used     Comment: 2 cigs a day  . Alcohol Use: No  . Drug Use: No  . Sexual Activity: Not Asked   Other Topics Concern  . None   Social History Narrative   Past Surgical History  Procedure Laterality Date  . Av fistula placement Bilateral 2005,2007    Uses Right arm  . Esophagogastroduodenoscopy endoscopy    . Knee arthroscopy  Left 2007  . Parathyroidectomy N/A 03/30/2013    Procedure: TOTAL PARATHYROIDECTOMY WITH AUTOTRANSPLANT;  Surgeon: Earnstine Regal, MD;  Location: [place];  Service: General;  Laterality: N/A;  Autotransplant to left lower arm.  . Total knee arthroplasty Right 03/27/2014    Procedure: UNICOMPARTMENTAL ARTHROPLASTY;  Surgeon: Meredith Pel, MD;  Location: [place];  Service: Orthopedics;  Laterality: Right;  . Breath tek h pylori N/A 05/15/2014    Procedure: BREATH TEK H PYLORI;  Surgeon: Alphonsa Overall, MD;  [facility];  Service: General;  Laterality: N/A;  . Laparoscopic gastric banding N/A 11/12/2014    Procedure: LAPAROSCOPIC GASTRIC BANDING;  Surgeon: Alphonsa Overall, MD;  Location: [facility];  Service: General;  Laterality: N/A;  . Esophagogastroduodenoscopy N/A 12/07/2014    Procedure: ESOPHAGOGASTRODUODENOSCOPY (EGD);  Surgeon: Alphonsa Overall, MD;  Location: [place];  Service: General;  Laterality: N/A;  . Esophagogastroduodenoscopy N/A 12/17/2014    Procedure: ESOPHAGOGASTRODUODENOSCOPY (EGD);  Surgeon: Alphonsa Overall, MD;  Location: [place];  Service: General;  Laterality: N/A;   Past Medical History  Diagnosis Date  . Hyperparathyroidism ([place])   . Morbid obesity ([place])   . Arthritis   . Complication of anesthesia     A little while to wake up after knee surgery in  2008  . Family history of anesthesia complication     mom slow to wake up  . Headache(784.0)     occasionally  . Dizziness     when coming off of dialysis  . Joint pain   . Joint swelling   . GERD (gastroesophageal reflux disease)     takes Omeprazole as needed  . Hypertension   . Renal disorder     MWF and goes to [facility]  . Renal insufficiency   . PONV (postoperative nausea and vomiting)    BP 86/48 mmHg  Pulse 87  Resp 14  SpO2 98%  Opioid Risk Score:   Fall Risk Score:  `1  Depression screen PHQ 2/9  Depression screen [facility] 2/9 07/11/2015 11/07/2014  Decreased Interest 2 0  Down, Depressed, Hopeless  1 0  PHQ - 2 Score 3 0  Altered sleeping 2 -  Tired, decreased energy 1 -  Change in appetite 3 -  Feeling bad or failure about yourself  2 -  Trouble concentrating 2 -  Moving slowly or fidgety/restless 1 -  Suicidal thoughts 0 -  PHQ-9 Score 14 -  Difficult doing work/chores Somewhat difficult -    Review of Systems  All other systems reviewed and are negative.      Objective:   Physical Exam  Constitutional: He is oriented to person, place, and time. He appears well-developed and well-nourished.  HENT:  Head: Normocephalic and atraumatic.  Eyes: Conjunctivae and EOM are normal. Pupils are equal, round, and reactive to light.  Musculoskeletal:       Left knee: He exhibits deformity. No tenderness found.  Neurological: He is alert and oriented to person, place, and time.  Negative straight leg raise test on left side  Psychiatric: He has a normal mood and affect.  Nursing note and vitals reviewed.  Morbidly obese male in no acute distressLeft knee has full range of motion. There is a mild valgus deformity. He has no pain with ambulation. He has full range of motion. No evidence of knee effusion.     Assessment & Plan:  1. End-stage osteoarthritis left knee. Patient has had 80-90% relief after genicular nerve blocks. Relief has persisted greater than 1 month. Patient is quite happy with result. We discussed that it is difficult to predict duration of result and that if the pain returns to previous levels would recommend repeat genicular nerve blocks. Depending on duration of pain relief patient may be a candidate for genicular nerve radiofrequency ablation.

## 2015-10-01 NOTE — Patient Instructions (Signed)
Please call to schedule another genicular nerve block on the left side once you feel that your left knee pain is increasing again

## 2016-01-01 ENCOUNTER — Telehealth: Payer: Self-pay | Admitting: *Deleted

## 2016-01-01 NOTE — Telephone Encounter (Signed)
Patient is having increased knee pain. He is asking for a script for pain medication.

## 2016-01-02 MED ORDER — TRAMADOL HCL 50 MG PO TABS: 50.0000 mg | ORAL_TABLET | Freq: Three times a day (TID) | ORAL | Status: DC | PRN

## 2016-01-02 NOTE — Telephone Encounter (Signed)
Schedule for repeat genicular nerve block under fluoroscopy, left knee  May call and tramadol 50 mg  1 by mouth 3 times a day when necessary #30

## 2016-01-02 NOTE — Telephone Encounter (Signed)
Tramadol called into pharm.  Can you please schedule? Also, make pt aware of new Tramadol script. Thanks!

## 2016-01-02 NOTE — Telephone Encounter (Signed)
Left voicemail for patient to call our office back to schedule knee injection--see Dr. Letta Pate note

## 2016-01-14 ENCOUNTER — Ambulatory Visit: Payer: Self-pay | Admitting: Physical Medicine & Rehabilitation

## 2016-01-17 ENCOUNTER — Ambulatory Visit (HOSPITAL_BASED_OUTPATIENT_CLINIC_OR_DEPARTMENT_OTHER): Payer: Medicare Other | Admitting: Physical Medicine & Rehabilitation

## 2016-01-17 ENCOUNTER — Encounter: Payer: Medicare Other | Attending: Physical Medicine & Rehabilitation

## 2016-01-17 ENCOUNTER — Encounter: Payer: Self-pay | Admitting: Physical Medicine & Rehabilitation

## 2016-01-17 VITALS — BP 89/55 | HR 90 | Resp 14

## 2016-01-17 DIAGNOSIS — M1712 Unilateral primary osteoarthritis, left knee: Secondary | ICD-10-CM

## 2016-01-17 DIAGNOSIS — G8929 Other chronic pain: Secondary | ICD-10-CM | POA: Insufficient documentation

## 2016-01-17 DIAGNOSIS — M25562 Pain in left knee: Secondary | ICD-10-CM

## 2016-01-17 MED ORDER — TRAMADOL HCL 50 MG PO TABS: 50.0000 mg | ORAL_TABLET | Freq: Three times a day (TID) | ORAL | Status: DC | PRN

## 2016-01-17 MED ORDER — TRAMADOL HCL 50 MG PO TABS: 100.0000 mg | ORAL_TABLET | Freq: Two times a day (BID) | ORAL | Status: DC | PRN

## 2016-01-17 NOTE — Progress Notes (Signed)
Left Knee Genicular nerve block x 3, Upper medial, Upper lateral , and Lower Medial under fluoroscopic guidance  Indication Chronic end-stage osteoarthritis of the knee which has not responded to conservative management such as physical therapy and medication management  Informed consent was obtained after describing risks and to the procedure to the patient these include bleeding bruising and infection, patient elects to proceed and has given written consent. Patient placed supine on the fluoroscopy table AP images of the knee joint were obtained. A 25-gauge 1.5 inch needle was used to anesthetize the skin and subcutaneous tissue with 1% lidocaine, 1 cc at each of 3 locations. Then a 22-gauge 3.5" spinal needle was inserted targeting the junction of the medial flare of the tibia with the shaft of the tibia, bone contact made and confirmed with lateral imaging. Then Omnipaque 180 x0.5 mL demonstrated no intravascular uptake followed by injection of a solution containing 1 cc of 6 mg per cc Celestone and 5 cc of 1% lidocaine. Then the junction of the medial epicondyles of the femur with the femoral shaft was targeted needle was advanced under fluoroscopic guidance until bone contact. Appropriate depth was obtained and confirmed with lateral images. Then Omnipaque 180 x0.5 mL demonstrated no intravascular uptake followed by injection of 2 cc of the Celestone lidocaine solution. Then the junction of the lateral femoral condyle with the femoral shaft was targeted. 22-gauge 3.5 inch needle was advanced under fluoroscopic guidance until bone contact. Appropriate depth was confirmed with lateral imaging. 0.5 mL of Omnipaque 180 injected followed by injection of 2 cc of the Celestone lidocaine solution. Patient tolerated procedure well. Post procedure instructions given  Pre-injection pain /10 Postinjection pain /10

## 2016-01-17 NOTE — Progress Notes (Signed)
  PROCEDURE RECORD [facility]   Name: ELISABETH SOWLES DOB:Dec 17, 1985 MRN: [id number]  Date:01/17/2016  Physician: Alysia Penna, MD    Nurse/CMA: Tanyah Debruyne, CMA  Allergies: No Known Allergies  Consent Signed: Yes.    Is patient diabetic? No.  CBG today? N/A  Pregnant: No. LMP: No LMP for male patient. (age 31-55)  Anticoagulants: no Anti-inflammatory: no Antibiotics: no  Procedure: left genicular nerve block (fluoro-guided)  Position: Supine Start Time: 3:26 pm  End Time: 3:36pm  Fluoro Time: 27  RN/CMA Kamauri Denardo, CMA Makayela Secrest, CMA    Time 3:00pm 3:43pm    BP 89/55 94/58    Pulse 90 90    Respirations 14 14    O2 Sat 95 95    S/S 6 6    Pain Level 7/10 2/10     D/C home with self, patient A & O X 3, D/C instructions reviewed, and sits independently.

## 2016-01-28 ENCOUNTER — Emergency Department (HOSPITAL_COMMUNITY)
Admission: EM | Admit: 2016-01-28 | Discharge: 2016-01-28 | Disposition: A | Discharge status: Home / Self Care | Payer: Medicare Other | Attending: Emergency Medicine | Admitting: Emergency Medicine

## 2016-01-28 ENCOUNTER — Encounter (HOSPITAL_COMMUNITY): Payer: Self-pay | Admitting: Nurse Practitioner

## 2016-01-28 DIAGNOSIS — Z87891 Personal history of nicotine dependence: Secondary | ICD-10-CM | POA: Diagnosis not present

## 2016-01-28 DIAGNOSIS — Z79899 Other long term (current) drug therapy: Secondary | ICD-10-CM | POA: Insufficient documentation

## 2016-01-28 DIAGNOSIS — K6289 Other specified diseases of anus and rectum: Secondary | ICD-10-CM | POA: Diagnosis not present

## 2016-01-28 DIAGNOSIS — Z96651 Presence of right artificial knee joint: Secondary | ICD-10-CM | POA: Diagnosis not present

## 2016-01-28 DIAGNOSIS — I10 Essential (primary) hypertension: Secondary | ICD-10-CM | POA: Insufficient documentation

## 2016-01-28 MED ORDER — KETOROLAC TROMETHAMINE 30 MG/ML IJ SOLN
30.0000 mg | Freq: Once | INTRAMUSCULAR | Status: AC
  Administered 2016-01-28: 30 mg via INTRAVENOUS
  Filled 2016-01-28: qty 1

## 2016-01-28 MED ORDER — LIDOCAINE HCL 2 % EX GEL
1.0000 "application " | Freq: Once | CUTANEOUS | Status: AC
  Administered 2016-01-28: 1 via TOPICAL
  Filled 2016-01-28 (×2): qty 20

## 2016-01-28 MED ORDER — LIDOCAINE HCL 2 % EX GEL: 1.0000 "application " | CUTANEOUS | Status: DC | PRN

## 2016-01-28 MED ORDER — DOCUSATE SODIUM 100 MG PO CAPS
100.0000 mg | ORAL_CAPSULE | Freq: Once | ORAL | Status: AC
  Administered 2016-01-28: 100 mg via ORAL
  Filled 2016-01-28: qty 1

## 2016-01-28 MED ORDER — HYDROMORPHONE HCL 1 MG/ML IJ SOLN
1.0000 mg | Freq: Once | INTRAMUSCULAR | Status: DC
  Filled 2016-01-28: qty 1

## 2016-01-28 MED ORDER — DOCUSATE SODIUM 100 MG PO CAPS: 100.0000 mg | ORAL_CAPSULE | Freq: Two times a day (BID) | ORAL | Status: DC

## 2016-01-28 NOTE — ED Notes (Signed)
He reports several week history of hard stool and rectal pain. He was seen at [facility] and diagnosed with a rectal tear. The pain became much more severe today, he called [facility] and was referred to the ED. He is alert and breathing easily

## 2016-01-28 NOTE — ED Provider Notes (Signed)
Complains of rectal pain for 3 [place] pain is worse with having a bowel movement constant. Denies abdominal pain denies fever. He does admit to some rectal bleeding 2 [place] ago with occasional blood on toilet paper. On exam alert and in no distress alert Glasgow Coma Score 15. Rectum (external exam only patient has exquisite pain at 12:00 at anus upon prying apart buttocks. Tiny fissure present at 12:00  Orlie Dakin, MD 01/28/16 1727

## 2016-01-28 NOTE — ED Provider Notes (Signed)
CSN: [id number]     Arrival date & time 01/28/16  1329 History   First MD Initiated Contact with Patient 01/28/16 1436     Chief Complaint  Patient presents with  . Rectal Pain    HPI   [name] is an 30 y.o. male with history of morbid obesity, ESRD, HTN, DM who presents tot he ED for evaluation of rectal pain from anal fissure. He states he is being seen by [facility] for evaluation and management of an anal fissure. He has had rectal pain for the past several [place]. He has had hard stools. He had some blood when wiping last week but none since then. He called [facility] today to notify them of increased pain for which he has been taking PO tramadol with no relief. Denies fever or chills. Denies further BRBPR. He states it is painful but he has still been able to have bowel movements. Denies nausea or vomiting.   Past Medical History  Diagnosis Date  . Hyperparathyroidism ([place])   . Morbid obesity ([place])   . Arthritis   . Complication of anesthesia     A little while to wake up after knee surgery in 2008  . Family history of anesthesia complication     mom slow to wake up  . Headache(784.0)     occasionally  . Dizziness     when coming off of dialysis  . Joint pain   . Joint swelling   . GERD (gastroesophageal reflux disease)     takes Omeprazole as needed  . Hypertension   . Renal disorder     MWF and goes to [facility]  . Renal insufficiency   . PONV (postoperative nausea and vomiting)    Past Surgical History  Procedure Laterality Date  . Av fistula placement Bilateral 2005,2007    Uses Right arm  . Esophagogastroduodenoscopy endoscopy    . Knee arthroscopy Left 2007  . Parathyroidectomy N/A 03/30/2013    Procedure: TOTAL PARATHYROIDECTOMY WITH AUTOTRANSPLANT;  Surgeon: Earnstine Regal, MD;  Location: [place];  Service: General;  Laterality: N/A;  Autotransplant to left lower arm.  . Total knee arthroplasty Right 03/27/2014    Procedure:  UNICOMPARTMENTAL ARTHROPLASTY;  Surgeon: Meredith Pel, MD;  Location: [place];  Service: Orthopedics;  Laterality: Right;  . Breath tek h pylori N/A 05/15/2014    Procedure: BREATH TEK H PYLORI;  Surgeon: Alphonsa Overall, MD;  [facility];  Service: General;  Laterality: N/A;  . Laparoscopic gastric banding N/A 11/12/2014    Procedure: LAPAROSCOPIC GASTRIC BANDING;  Surgeon: Alphonsa Overall, MD;  [facility];  Service: General;  Laterality: N/A;  . Esophagogastroduodenoscopy N/A 12/07/2014    Procedure: ESOPHAGOGASTRODUODENOSCOPY (EGD);  Surgeon: Alphonsa Overall, MD;  Location: [place];  Service: General;  Laterality: N/A;  . Esophagogastroduodenoscopy N/A 12/17/2014    Procedure: ESOPHAGOGASTRODUODENOSCOPY (EGD);  Surgeon: Alphonsa Overall, MD;  [facility];  Service: General;  Laterality: N/A;   Family History  Problem Relation Age of Onset  . Hypertension Mother   . Kidney Stones Sister   . Diabetes Maternal Grandmother   . Liver cancer Maternal Uncle   . Diabetes Father    Social History  Substance Use Topics  . Smoking status: Former Smoker -- 0.20 packs/day for 13 years    Types: Cigarettes    Quit date: 12/17/2014  . Smokeless tobacco: Never Used     Comment: 2 cigs a day  . Alcohol  Use: No    Review of Systems  All other systems reviewed and are negative.     Allergies  Review of patient's allergies indicates no known allergies.  Home Medications   Prior to Admission medications   Medication Sig Start Date End Date Taking? Authorizing Provider  acetaminophen-codeine (TYLENOL #3) 300-30 MG tablet TK 1 T PO  D PRN 06/07/15  Yes Historical Provider, MD  b complex-vitamin c-folic acid (NEPHRO-VITE) 0.8 MG TABS tablet Take 1 tablet by mouth daily. 12/28/14  Yes Historical Provider, MD  calcium carbonate (TUMS - DOSED IN MG ELEMENTAL CALCIUM) 500 MG chewable tablet Chew 2 tablets by mouth 2 (two) times daily as needed for indigestion or heartburn.    Yes  Historical Provider, MD  diazepam (VALIUM) 10 MG tablet Take 1 tablet (10 mg total) by mouth every 6 (six) hours as needed for anxiety. 10/01/15  Yes Charlett Blake, MD  flintstones complete (FLINTSTONES) 60 MG chewable tablet Chew 1 tablet by mouth daily.   Yes Historical Provider, MD  glycopyrrolate (ROBINUL) 2 MG tablet Take 1 tablet (2 mg total) by mouth 2 (two) times daily. 01/25/15  Yes Inda Castle, MD  Hyoscyamine Sulfate 0.375 MG TBCR Take 1 tablet twice daily 01/22/15  Yes Inda Castle, MD  methocarbamol (ROBAXIN) 500 MG tablet Take 1 tablet by mouth as needed for muscle spasms.  10/29/14  Yes Historical Provider, MD  omeprazole (PRILOSEC) 20 MG capsule Take 20 mg by mouth daily as needed (for heartburn).    Yes Historical Provider, MD  sevelamer carbonate (RENVELA) 2.4 G PACK Take 2.4 g by mouth daily as needed (lower the amount of phosphorus in the blood).    Yes Historical Provider, MD  traMADol (ULTRAM) 50 MG tablet Take 2 tablets (100 mg total) by mouth every 12 (twelve) hours as needed. 01/17/16  Yes Charlett Blake, MD  oxyCODONE-acetaminophen (PERCOCET/ROXICET) 5-325 MG per tablet Take 2 tablets by mouth every 4 (four) hours as needed for severe pain. 01/03/15   Hanna Patel-Mills, [name]  traMADol (ULTRAM) 50 MG tablet Take 1 tablet (50 mg total) by mouth every 8 (eight) hours as needed. 01/17/16   Charlett Blake, MD   BP 162/141 mmHg  Pulse 98  Temp(Src) 98.8 F (37.1 C) (Oral)  Resp 16  SpO2 98% Physical Exam  Constitutional: He is oriented to person, place, and time. No distress.  HENT:  Head: Atraumatic.  Right Ear: External ear normal.  Left Ear: External ear normal.  Nose: Nose normal.  Eyes: Conjunctivae are normal. No scleral icterus.  Neck: Normal range of motion. Neck supple.  Cardiovascular: Normal rate and regular rhythm.   Pulmonary/Chest: Effort normal. No respiratory distress. He exhibits no tenderness.  Abdominal: Soft. He exhibits no distension.  There is no tenderness.  Genitourinary:  Anal area exquisitely tender and painful with even gentle manipulation of buttocks. Tenderness particularly at 12:00 of anus. No abscess. No bleeding. Internal digital rectal exam deferred.  Neurological: He is alert and oriented to person, place, and time.  Skin: Skin is warm and dry. He is not diaphoretic.  Psychiatric: He has a normal mood and affect. His behavior is normal.  Nursing note and vitals reviewed.   ED Course  Procedures (including critical care time) Labs Review Labs Reviewed - No data to display  Imaging Review No results found. I have personally reviewed and evaluated these images and lab results as part of my medical decision-making.   EKG Interpretation None  MDM   Final diagnoses:  None    Initially had planned for IV pain meds. However, repeat BP is low. Pt states his BP is normally low. Not lightheaded or dizzy. We will give dose of toradol in the ED. Also provided with lidocaine jelly which pt applied with some relief. Rx given for same. Will start on docusate to help soften stools as pt reports he has not been taking any stool softeners or laxatives. Encouraged f/u with [facility]. ER return precautions given.    Anne Ng, [name] 01/29/16 [place], MD 01/30/16 [phone]

## 2016-01-28 NOTE — ED Notes (Signed)
MD at bedside. 

## 2016-01-28 NOTE — ED Notes (Signed)
PA Sam advised it is okay for patient to have toradol iv since he is a dialysis patient.

## 2016-01-28 NOTE — Discharge Instructions (Signed)
Please follow up with [facility]. Apply lidocaine as needed for pain. Take the docusate to help soften your stools.   Anal Fissure, Adult An anal fissure is a small tear or crack in the skin around the anus. Bleeding from a fissure usually stops on its own within a few minutes. However, bleeding will often occur again with each bowel movement until the crack heals. CAUSES This condition may be caused by:  Passing large, hard stool (feces).  Frequent diarrhea.  Constipation.  Inflammatory bowel disease (Crohn disease or ulcerative colitis).  Infections.  Anal sex. SYMPTOMS Symptoms of this condition include:  Bleeding from the rectum.  Small amounts of blood seen on your stool, on toilet paper, or in the toilet after a bowel movement.  Painful bowel movements.  Itching or irritation around the anus. DIAGNOSIS A health care provider may diagnose this condition by closely examining the anal area. An anal fissure can usually be seen with careful inspection. In some cases, a rectal exam may be performed, or a short tube (anoscope) may be used to examine the anal canal. TREATMENT Treatment for this condition may include:  Taking steps to avoid constipation. This may include making changes to your diet, such as increasing your intake of fiber or fluid.  Taking fiber supplements. These supplements can soften your stool to help make bowel movements easier. Your health care provider may also prescribe a stool softener if your stool is often hard.  Taking sitz baths. This may help to heal the tear.  Using medicated creams or ointments. These may be prescribed to lessen discomfort. HOME CARE INSTRUCTIONS Eating and Drinking  Avoid foods that may be constipating, such as bananas and dairy products.  Drink enough fluid to keep your urine clear or pale yellow.  Maintain a diet that is high in fruits, whole grains, and vegetables. General Instructions  Keep the anal  area as clean and dry as possible.  Take sitz baths as told by your health care provider. Do not use soap in the sitz baths.  Take over-the-counter and prescription medicines only as told by your health care provider.  Use creams or ointments only as told by your health care provider.  Keep all follow-up visits as told by your health care provider. This is important. SEEK MEDICAL CARE IF:  You have more bleeding.  You have a fever.  You have diarrhea that is mixed with blood.  You continue to have pain.  Your problem is getting worse rather than better.   This information is not intended to replace advice given to you by your health care provider. Make sure you discuss any questions you have with your health care provider.   Document Released: 07/20/2005 Document Revised: 04/10/2015 Document Reviewed: 10/15/2014 Elsevier Interactive Patient Education [employer].

## 2016-02-03 ENCOUNTER — Emergency Department (HOSPITAL_COMMUNITY): Payer: Medicare Other

## 2016-02-03 ENCOUNTER — Emergency Department (HOSPITAL_COMMUNITY)
Admission: EM | Admit: 2016-02-03 | Discharge: 2016-02-04 | Disposition: A | Discharge status: Home / Self Care | Payer: Medicare Other | Attending: Emergency Medicine | Admitting: Emergency Medicine

## 2016-02-03 ENCOUNTER — Encounter (HOSPITAL_COMMUNITY): Payer: Self-pay | Admitting: *Deleted

## 2016-02-03 DIAGNOSIS — Z96651 Presence of right artificial knee joint: Secondary | ICD-10-CM | POA: Insufficient documentation

## 2016-02-03 DIAGNOSIS — I10 Essential (primary) hypertension: Secondary | ICD-10-CM | POA: Diagnosis not present

## 2016-02-03 DIAGNOSIS — Z87891 Personal history of nicotine dependence: Secondary | ICD-10-CM | POA: Insufficient documentation

## 2016-02-03 DIAGNOSIS — M25561 Pain in right knee: Secondary | ICD-10-CM

## 2016-02-03 LAB — CBG MONITORING, ED: GLUCOSE-CAPILLARY: 119 mg/dL — AB (ref 65–99)

## 2016-02-03 MED ORDER — OXYCODONE-ACETAMINOPHEN 5-325 MG PO TABS
1.0000 | ORAL_TABLET | Freq: Once | ORAL | Status: AC
  Administered 2016-02-03: 1 via ORAL
  Filled 2016-02-03: qty 1

## 2016-02-03 NOTE — ED Notes (Signed)
Patient presents with severe right knee pain that started this AM  Denies injury

## 2016-02-03 NOTE — ED Provider Notes (Signed)
MSE was initiated and I personally evaluated the patient and placed orders (if any) at  11:33 PM on February 03, 2016. [name] is a 30 y.o. male who presents to the ED with severe right knee pain that started earlier today. He has a hx of chronic knee pain but reports that the pain is different and more severe than it has ever been in the past.   Patient is a diabetic, HTN, and multiple other chronic illnesses. Tonight the patient is hypotensive and tachycardic.  Right knee with swelling and tenderness with palpation and range of motion. Scar noted from previous surgery. Pedal pulse 2+.   Patient also reports that he was evaluated here 6/27 for rectal bleeding and has an appointment with the GI doctor in 2 days but he has continued to have rectal pain and bleeding.   X-ray of right knee ordered, cbc, cbg    BP 90/59 mmHg  Pulse 107  Temp(Src) 98.8 F (37.1 C) (Oral)  Resp 20  Wt 129.275 kg  SpO2 99%  The patient appears stable so that the remainder of the MSE may be completed by another provider.  [address] [place], NP 02/03/16 [id number]  Merrily Pew, MD 02/04/16 [phone]

## 2016-02-04 ENCOUNTER — Encounter (HOSPITAL_COMMUNITY): Payer: Self-pay | Admitting: Emergency Medicine

## 2016-02-04 DIAGNOSIS — M25561 Pain in right knee: Secondary | ICD-10-CM | POA: Diagnosis not present

## 2016-02-04 LAB — CBC
HCT: 38.3 % — ABNORMAL LOW (ref 39.0–52.0)
HEMOGLOBIN: 12.6 g/dL — AB (ref 13.0–17.0)
MCH: 31.3 pg (ref 26.0–34.0)
MCHC: 32.9 g/dL (ref 30.0–36.0)
MCV: 95 fL (ref 78.0–100.0)
PLATELETS: 201 10*3/uL (ref 150–400)
RBC: 4.03 MIL/uL — AB (ref 4.22–5.81)
RDW: 13.4 % (ref 11.5–15.5)
WBC: 12.9 10*3/uL — AB (ref 4.0–10.5)

## 2016-02-04 MED ORDER — VOLTAREN 1 % TD GEL: 4.0000 g | Freq: Four times a day (QID) | TRANSDERMAL | Status: DC

## 2016-02-04 MED ORDER — METHOCARBAMOL 500 MG PO TABS
1000.0000 mg | ORAL_TABLET | Freq: Once | ORAL | Status: AC
  Administered 2016-02-04: 1000 mg via ORAL
  Filled 2016-02-04: qty 2

## 2016-02-04 MED ORDER — KETOROLAC TROMETHAMINE 30 MG/ML IJ SOLN
30.0000 mg | Freq: Once | INTRAMUSCULAR | Status: AC
  Administered 2016-02-04: 30 mg via INTRAVENOUS
  Filled 2016-02-04: qty 1

## 2016-02-04 MED ORDER — METHOCARBAMOL 500 MG PO TABS: 500.0000 mg | ORAL_TABLET | Freq: Two times a day (BID) | ORAL | Status: DC

## 2016-02-04 NOTE — Discharge Instructions (Signed)

## 2016-02-04 NOTE — ED Provider Notes (Signed)
CSN: [id number]     Arrival date & time 02/03/16  2220 History  By signing my name below, I, Rowan Blase, attest that this documentation has been prepared under the direction and in the presence of Keliyah Spillman, MD . Electronically Signed: Rowan Blase, Scribe. 02/04/2016. 12:58 AM.   Chief Complaint  Patient presents with  . Knee Pain    Patient is a 30 y.o. male presenting with knee pain. The history is provided by the patient. No language interpreter was used.  Knee Pain Location:  Knee Injury: no   Knee location:  R knee Pain details:    Quality:  Aching   Radiates to:  Does not radiate   Severity:  Moderate   Onset quality:  Gradual   Timing:  Constant   Progression:  Unchanged Chronicity:  Chronic Relieved by:  Nothing Worsened by:  Nothing tried Associated symptoms: no fever   Risk factors: no concern for non-accidental trauma    HPI Comments:  [name] is a 30 y.o. male with PMhx of arthritis, HTN, renal disorder and total right knee arthroplasty who presents to the Emergency Department complaining of "severe" right knee pain onset yesterday morning. He notes pain worsens when standing. No alleviating factors noted. Pt is a dialysis pt. Denies fever.  Dr. Marlou Sa - Surgeon  Past Medical History  Diagnosis Date  . Hyperparathyroidism ([place])   . Morbid obesity ([place])   . Arthritis   . Complication of anesthesia     A little while to wake up after knee surgery in 2008  . Family history of anesthesia complication     mom slow to wake up  . Headache(784.0)     occasionally  . Dizziness     when coming off of dialysis  . Joint pain   . Joint swelling   . GERD (gastroesophageal reflux disease)     takes Omeprazole as needed  . Hypertension   . Renal disorder     MWF and goes to [facility]  . Renal insufficiency   . PONV (postoperative nausea and vomiting)    Past Surgical History  Procedure Laterality Date  . Av fistula placement Bilateral 2005,2007     Uses Right arm  . Esophagogastroduodenoscopy endoscopy    . Knee arthroscopy Left 2007  . Parathyroidectomy N/A 03/30/2013    Procedure: TOTAL PARATHYROIDECTOMY WITH AUTOTRANSPLANT;  Surgeon: Earnstine Regal, MD;  Location: [place];  Service: General;  Laterality: N/A;  Autotransplant to left lower arm.  . Total knee arthroplasty Right 03/27/2014    Procedure: UNICOMPARTMENTAL ARTHROPLASTY;  Surgeon: Meredith Pel, MD;  Location: [place];  Service: Orthopedics;  Laterality: Right;  . Breath tek h pylori N/A 05/15/2014    Procedure: BREATH TEK H PYLORI;  Surgeon: Alphonsa Overall, MD;  Location: [facility];  Service: General;  Laterality: N/A;  . Laparoscopic gastric banding N/A 11/12/2014    Procedure: LAPAROSCOPIC GASTRIC BANDING;  Surgeon: Alphonsa Overall, MD;  Location: [facility];  Service: General;  Laterality: N/A;  . Esophagogastroduodenoscopy N/A 12/07/2014    Procedure: ESOPHAGOGASTRODUODENOSCOPY (EGD);  Surgeon: Alphonsa Overall, MD;  Location: [place];  Service: General;  Laterality: N/A;  . Esophagogastroduodenoscopy N/A 12/17/2014    Procedure: ESOPHAGOGASTRODUODENOSCOPY (EGD);  Surgeon: Alphonsa Overall, MD;  Location: [facility] OR;  Service: General;  Laterality: N/A;   Family History  Problem Relation Age of Onset  . Hypertension Mother   . Kidney Stones Sister   . Diabetes Maternal Grandmother   .  Liver cancer Maternal Uncle   . Diabetes Father    Social History  Substance Use Topics  . Smoking status: Former Smoker -- 0.20 packs/day for 13 years    Types: Cigarettes    Quit date: 12/17/2014  . Smokeless tobacco: Never Used     Comment: 2 cigs a day  . Alcohol Use: No    Review of Systems  Constitutional: Negative for fever.  Musculoskeletal: Positive for arthralgias.  All other systems reviewed and are negative.  Allergies  Review of patient's allergies indicates no known allergies.  Home Medications   Prior to Admission medications   Medication Sig Start Date End Date  Taking? Authorizing Provider  acetaminophen-codeine (TYLENOL #3) 300-30 MG tablet TK 1 T PO  D PRN 06/07/15   Historical Provider, MD  b complex-vitamin c-folic acid (NEPHRO-VITE) 0.8 MG TABS tablet Take 1 tablet by mouth daily. 12/28/14   Historical Provider, MD  calcium carbonate (TUMS - DOSED IN MG ELEMENTAL CALCIUM) 500 MG chewable tablet Chew 2 tablets by mouth 2 (two) times daily as needed for indigestion or heartburn.     Historical Provider, MD  diazepam (VALIUM) 10 MG tablet Take 1 tablet (10 mg total) by mouth every 6 (six) hours as needed for anxiety. 10/01/15   Charlett Blake, MD  docusate sodium (COLACE) 100 MG capsule Take 1 capsule (100 mg total) by mouth every 12 (twelve) hours. 01/28/16   Olivia Canter Sam, [name]  flintstones complete (FLINTSTONES) 60 MG chewable tablet Chew 1 tablet by mouth daily.    Historical Provider, MD  glycopyrrolate (ROBINUL) 2 MG tablet Take 1 tablet (2 mg total) by mouth 2 (two) times daily. 01/25/15   Inda Castle, MD  Hyoscyamine Sulfate 0.375 MG TBCR Take 1 tablet twice daily 01/22/15   Inda Castle, MD  lidocaine (XYLOCAINE) 2 % jelly Apply 1 application topically as needed. 01/28/16   Olivia Canter Sam, [name]  methocarbamol (ROBAXIN) 500 MG tablet Take 1 tablet by mouth as needed for muscle spasms.  10/29/14   Historical Provider, MD  omeprazole (PRILOSEC) 20 MG capsule Take 20 mg by mouth daily as needed (for heartburn).     Historical Provider, MD  oxyCODONE-acetaminophen (PERCOCET/ROXICET) 5-325 MG per tablet Take 2 tablets by mouth every 4 (four) hours as needed for severe pain. 01/03/15   Hanna Patel-[place], [name]  sevelamer carbonate (RENVELA) 2.4 G PACK Take 2.4 g by mouth daily as needed (lower the amount of phosphorus in the blood).     Historical Provider, MD  traMADol (ULTRAM) 50 MG tablet Take 1 tablet (50 mg total) by mouth every 8 (eight) hours as needed. 01/17/16   Charlett Blake, MD  traMADol (ULTRAM) 50 MG tablet Take 2 tablets (100 mg total) by  mouth every 12 (twelve) hours as needed. 01/17/16   Charlett Blake, MD   BP 90/59 mmHg  Pulse 107  Temp(Src) 98.8 F (37.1 C) (Oral)  Resp 20  Wt 285 lb (129.275 kg)  SpO2 99%   Physical Exam  HENT:  Mouth/Throat: Oropharynx is clear and moist. No oropharyngeal exudate.  Eyes: Pupils are equal, round, and reactive to light.  Neck: Carotid bruit is not present. No tracheal deviation present.  Cardiovascular: Normal rate and regular rhythm.   Pulmonary/Chest: Effort normal and breath sounds normal. No stridor. He has no wheezes. He has no rales.  Abdominal: Soft. Bowel sounds are normal. He exhibits no mass. There is no rebound and no guarding.  Musculoskeletal:  No tibial plateau tenderness, quadricep and patellar tendons in place, no laxity to vagus or valgus stress; negative anterior and posterior drawer test, intact DP, all compartments soft; no erythema, no warmth, no effusion  Neurological: He is alert.  Skin: Skin is warm and dry.  Nursing note and vitals reviewed.   ED Course  Procedures  DIAGNOSTIC STUDIES:  Oxygen Saturation is 99% on RA, normal by my interpretation.    COORDINATION OF CARE:  12:55 AM Will give Toradol injection. Discussed treatment plan with pt at bedside and pt agreed to plan.  Labs Review Labs Reviewed  CBC - Abnormal; Notable for the following:    WBC 12.9 (*)    RBC 4.03 (*)    Hemoglobin 12.6 (*)    HCT 38.3 (*)    All other components within normal limits  CBG MONITORING, ED - Abnormal; Notable for the following:    Glucose-Capillary 119 (*)    All other components within normal limits    Imaging Review Dg Knee Complete 4 Views Right  02/03/2016  CLINICAL DATA:  Knee pain and swelling beginning this morning. EXAM: RIGHT KNEE - COMPLETE 4+ VIEW COMPARISON:  None. FINDINGS: No visible joint effusion. The patient has had previous lateral compartment arthroplasty. No complicating feature by plain radiography. In the medial compartment,  irregularity of the medial corner of the medial femoral condyle I will could represent a small focus of avascular necrosis. Question some intra-articular free fragments from that. Mild osteoarthritis affects the patellofemoral joint. IMPRESSION: Previous lateral compartment arthroplasty. 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 Electronically Signed   By: Nelson Chimes M.D.   On: 02/03/2016 22:58   I have personally reviewed and evaluated these images and lab results as part of my medical decision-making.   EKG Interpretation None      MDM   Final diagnoses:  None   Filed Vitals:   02/03/16 2227  BP: 90/59  Pulse: 107  Temp: 98.8 F (37.1 C)  Resp: 20   Results for orders placed or performed during the hospital encounter of 02/03/16  CBC  Result Value Ref Range   WBC 12.9 (H) 4.0 - 10.5 K/uL   RBC 4.03 (L) 4.22 - 5.81 MIL/uL   Hemoglobin 12.6 (L) 13.0 - 17.0 g/dL   HCT 38.3 (L) 39.0 - 52.0 %   MCV 95.0 78.0 - 100.0 fL   MCH 31.3 26.0 - 34.0 pg   MCHC 32.9 30.0 - 36.0 g/dL   RDW 13.4 11.5 - 15.5 %   Platelets 201 150 - 400 K/uL  POC CBG, ED  Result Value Ref Range   Glucose-Capillary 119 (H) 65 - 99 mg/dL   Comment 1 Notify RN    Dg Knee Complete 4 Views Right  02/03/2016  CLINICAL DATA:  Knee pain and swelling beginning this morning. EXAM: RIGHT KNEE - COMPLETE 4+ VIEW COMPARISON:  None. FINDINGS: No visible joint effusion. The patient has had previous lateral compartment arthroplasty. No complicating feature by plain radiography. In the medial compartment, irregularity of the medial corner of the medial femoral condyle I will could represent a small focus of avascular necrosis. Question some intra-articular free fragments from that. Mild osteoarthritis affects the patellofemoral joint. IMPRESSION: Previous lateral compartment arthroplasty. Suspicion of avascular necrosis at the medial  corner of the medial femoral condyles, possibly with free intraarticular fragments. Electronically Signed   By: Nelson Chimes M.D.   On:  02/03/2016 22:58    Follow up with Dr. Marlou Sa strict return precautions  I personally performed the services described in this documentation, which was scribed in my presence. The recorded information has been reviewed and is accurate.      Veatrice Kells, MD 02/10/16 2253

## 2016-02-18 ENCOUNTER — Ambulatory Visit: Payer: Self-pay | Admitting: Physical Medicine & Rehabilitation

## 2016-02-19 ENCOUNTER — Encounter (HOSPITAL_COMMUNITY): Payer: Self-pay | Admitting: *Deleted

## 2016-02-19 ENCOUNTER — Emergency Department (HOSPITAL_COMMUNITY)
Admission: EM | Admit: 2016-02-19 | Discharge: 2016-02-19 | Disposition: A | Discharge status: Home / Self Care | Payer: Medicare Other | Attending: Emergency Medicine | Admitting: Emergency Medicine

## 2016-02-19 DIAGNOSIS — Z96651 Presence of right artificial knee joint: Secondary | ICD-10-CM | POA: Insufficient documentation

## 2016-02-19 DIAGNOSIS — I10 Essential (primary) hypertension: Secondary | ICD-10-CM | POA: Insufficient documentation

## 2016-02-19 DIAGNOSIS — Z87891 Personal history of nicotine dependence: Secondary | ICD-10-CM | POA: Insufficient documentation

## 2016-02-19 DIAGNOSIS — K6289 Other specified diseases of anus and rectum: Secondary | ICD-10-CM | POA: Diagnosis not present

## 2016-02-19 MED ORDER — LIDOCAINE HCL 2 % EX GEL: 1.0000 "application " | CUTANEOUS | Status: DC | PRN

## 2016-02-19 MED ORDER — KETOROLAC TROMETHAMINE 30 MG/ML IJ SOLN
30.0000 mg | Freq: Once | INTRAMUSCULAR | Status: AC
  Administered 2016-02-19: 30 mg via INTRAMUSCULAR
  Filled 2016-02-19: qty 1

## 2016-02-19 MED ORDER — DOCUSATE SODIUM 100 MG PO CAPS: 100.0000 mg | ORAL_CAPSULE | Freq: Two times a day (BID) | ORAL | Status: DC

## 2016-02-19 NOTE — ED Provider Notes (Signed)
CSN: [id number]     Arrival date & time 02/19/16  1627 History   First MD Initiated Contact with Patient 02/19/16 1750     Chief Complaint  Patient presents with  . Rectal Pain     (Consider location/radiation/quality/duration/timing/severity/associated sxs/prior Treatment) HPI [name] is a 30 y.o. male history of obesity, recently diagnosed with anal fissure here for evaluation of acute pain secondary to his anal fissure. Patient reports he was diagnosed at the end of June with a rectal tear, referred to surgery states he has an appointment with them next week for reevaluation. He reports increasing pain with sitting. Symptoms somewhat improved with lidocaine gel. Has been taking Colace as prescribed. Denies any chest pain, shortness of breath, numbness or weakness, dizziness, or overtly bloody stool. No abdominal pain, nausea or vomiting, urinary symptoms. No penile discharge, testicular pain or swelling. He does report blood on his toilet paper. No other modifying factors  Past Medical History  Diagnosis Date  . Hyperparathyroidism ([place])   . Morbid obesity ([place])   . Arthritis   . Complication of anesthesia     A little while to wake up after knee surgery in 2008  . Family history of anesthesia complication     mom slow to wake up  . Headache(784.0)     occasionally  . Dizziness     when coming off of dialysis  . Joint pain   . Joint swelling   . GERD (gastroesophageal reflux disease)     takes Omeprazole as needed  . Hypertension   . Renal disorder     MWF and goes to [facility]  . Renal insufficiency   . PONV (postoperative nausea and vomiting)    Past Surgical History  Procedure Laterality Date  . Av fistula placement Bilateral 2005,2007    Uses Right arm  . Esophagogastroduodenoscopy endoscopy    . Knee arthroscopy Left 2007  . Parathyroidectomy N/A 03/30/2013    Procedure: TOTAL PARATHYROIDECTOMY WITH AUTOTRANSPLANT;  Surgeon: Earnstine Regal, MD;  Location: [place];  Service: General;  Laterality: N/A;  Autotransplant to left lower arm.  . Total knee arthroplasty Right 03/27/2014    Procedure: UNICOMPARTMENTAL ARTHROPLASTY;  Surgeon: Meredith Pel, MD;  Location: [place];  Service: Orthopedics;  Laterality: Right;  . Breath tek h pylori N/A 05/15/2014    Procedure: BREATH TEK H PYLORI;  Surgeon: Alphonsa Overall, MD;  [facility];  Service: General;  Laterality: N/A;  . Laparoscopic gastric banding N/A 11/12/2014    Procedure: LAPAROSCOPIC GASTRIC BANDING;  Surgeon: Alphonsa Overall, MD;  [facility];  Service: General;  Laterality: N/A;  . Esophagogastroduodenoscopy N/A 12/07/2014    Procedure: ESOPHAGOGASTRODUODENOSCOPY (EGD);  Surgeon: Alphonsa Overall, MD;  Location: [place];  Service: General;  Laterality: N/A;  . Esophagogastroduodenoscopy N/A 12/17/2014    Procedure: ESOPHAGOGASTRODUODENOSCOPY (EGD);  Surgeon: Alphonsa Overall, MD;  Location: [facility] OR;  Service: General;  Laterality: N/A;   Family History  Problem Relation Age of Onset  . Hypertension Mother   . Kidney Stones Sister   . Diabetes Maternal Grandmother   . Liver cancer Maternal Uncle   . Diabetes Father    Social History  Substance Use Topics  . Smoking status: Former Smoker -- 0.20 packs/day for 13 years    Types: Cigarettes    Quit date: 12/17/2014  . Smokeless tobacco: Never Used     Comment: 2 cigs a day  . Alcohol Use: No    Review  of Systems A 10 point review of systems was completed and was negative except for pertinent positives and negatives as mentioned in the history of present illness     Allergies  Review of patient's allergies indicates no known allergies.  Home Medications   Prior to Admission medications   Medication Sig Start Date End Date Taking? Authorizing Provider  calcium carbonate (TUMS - DOSED IN MG ELEMENTAL CALCIUM) 500 MG chewable tablet Chew 2 tablets by mouth 2 (two) times daily as needed for indigestion or heartburn.     Historical  Provider, MD  diazepam (VALIUM) 10 MG tablet Take 1 tablet (10 mg total) by mouth every 6 (six) hours as needed for anxiety. Patient not taking: Reported on 02/04/2016 10/01/15   Charlett Blake, MD  docusate sodium (COLACE) 100 MG capsule Take 1 capsule (100 mg total) by mouth every 12 (twelve) hours. 02/19/16   Comer Locket, [name]  glycopyrrolate (ROBINUL) 2 MG tablet Take 1 tablet (2 mg total) by mouth 2 (two) times daily. Patient not taking: Reported on 02/04/2016 01/25/15   Inda Castle, MD  Hyoscyamine Sulfate 0.375 MG TBCR Take 1 tablet twice daily Patient not taking: Reported on 02/04/2016 01/22/15   Inda Castle, MD  lidocaine (XYLOCAINE) 2 % jelly Apply 1 application topically as needed. 02/19/16   Comer Locket, [name]  methocarbamol (ROBAXIN) 500 MG tablet Take 1 tablet (500 mg total) by mouth 2 (two) times daily. 02/04/16   April Palumbo, MD  multivitamin (RENA-VIT) TABS tablet Take 1 tablet by mouth daily.    Historical Provider, MD  oxyCODONE-acetaminophen (PERCOCET/ROXICET) 5-325 MG per tablet Take 2 tablets by mouth every 4 (four) hours as needed for severe pain. Patient not taking: Reported on 02/04/2016 01/03/15   Ottie Glazier, [name]  sevelamer carbonate (RENVELA) 2.4 G PACK Take 2.4 g by mouth 3 (three) times daily with meals.     Historical Provider, MD  traMADol (ULTRAM) 50 MG tablet Take 1 tablet (50 mg total) by mouth every 8 (eight) hours as needed. Patient not taking: Reported on 02/04/2016 01/17/16   Charlett Blake, MD  traMADol (ULTRAM) 50 MG tablet Take 2 tablets (100 mg total) by mouth every 12 (twelve) hours as needed. 01/17/16   Charlett Blake, MD  VOLTAREN 1 % GEL Apply 4 g topically 4 (four) times daily. 02/04/16   April Palumbo, MD   BP 78/52 mmHg  Pulse 80  Temp(Src) 98.1 F (36.7 C) (Oral)  Resp 16  Ht 5\' 6"  (1.676 m)  Wt 135 kg  BMI 48.06 kg/m2  SpO2 99% Physical Exam  Constitutional: He is oriented to person, place, and time. He appears  well-developed and well-nourished.  HENT:  Head: Normocephalic and atraumatic.  Mouth/Throat: Oropharynx is clear and moist.  Eyes: Conjunctivae are normal. Pupils are equal, round, and reactive to light. Right eye exhibits no discharge. Left eye exhibits no discharge. No scleral icterus.  Neck: Neck supple.  Cardiovascular: Normal rate, regular rhythm and normal heart sounds.   Pulmonary/Chest: Effort normal and breath sounds normal. No respiratory distress. He has no wheezes. He has no rales.  Abdominal: Soft. There is no tenderness.  Genitourinary:  Unremarkable rectal exam. Small skin tag. Possible small anal fissure. No overt or obvious tear.  Musculoskeletal: He exhibits no tenderness.  Neurological: He is alert and oriented to person, place, and time.  Cranial Nerves II-XII grossly intact  Skin: Skin is warm and dry. No rash noted.  Psychiatric: He has a  normal mood and affect.  Nursing note and vitals reviewed.   ED Course  Procedures (including critical care time) Labs Review Labs Reviewed - No data to display  Imaging Review No results found. I have personally reviewed and evaluated these images and lab results as part of my medical decision-making.   EKG Interpretation None      MDM  Patient was previously diagnosis anal fissure with exacerbation of pain. We'll represcribe lidocaine jelly, Colace. Discussed high fiber diet. Low suspicion for prostatitis or other emergent GU pathology. Patient hypotensive, but this appears to be patient's baseline. He is asymptomatic and denies any dizziness, chest pain, shortness of breath, numbness or weakness. He is to follow-up with his surgeon. Return precautions discussed.  Prior to patient discharge, I discussed and reviewed this case with Dr.Pickering  Final diagnoses:  Rectal pain        Comer Locket, [name] 02/19/16 [place], MD 02/19/16 2352

## 2016-02-19 NOTE — ED Notes (Signed)
Pt c/o rectal pain. Pt states he was dx with a rectal tear last week. Pt states he only sees spots of blood on the tissue. Pt reports rectal pain is unbearable. Pt is a dialysis pt, MWF.

## 2016-02-19 NOTE — Discharge Instructions (Signed)
Anal Fissure, Adult An anal fissure is a small tear or crack in the skin around the anus. Bleeding from a fissure usually stops on its own within a few minutes. However, bleeding will often occur again with each bowel movement until the crack heals. CAUSES This condition may be caused by:  Passing large, hard stool (feces).  Frequent diarrhea.  Constipation.  Inflammatory bowel disease (Crohn disease or ulcerative colitis).  Infections.  Anal sex. SYMPTOMS Symptoms of this condition include:  Bleeding from the rectum.  Small amounts of blood seen on your stool, on toilet paper, or in the toilet after a bowel movement.  Painful bowel movements.  Itching or irritation around the anus. DIAGNOSIS A health care provider may diagnose this condition by closely examining the anal area. An anal fissure can usually be seen with careful inspection. In some cases, a rectal exam may be performed, or a short tube (anoscope) may be used to examine the anal canal. TREATMENT Treatment for this condition may include:  Taking steps to avoid constipation. This may include making changes to your diet, such as increasing your intake of fiber or fluid.  Taking fiber supplements. These supplements can soften your stool to help make bowel movements easier. Your health care provider may also prescribe a stool softener if your stool is often hard.  Taking sitz baths. This may help to heal the tear.  Using medicated creams or ointments. These may be prescribed to lessen discomfort. HOME CARE INSTRUCTIONS Eating and Drinking  Avoid foods that may be constipating, such as bananas and dairy products.  Drink enough fluid to keep your urine clear or pale yellow.  Maintain a diet that is high in fruits, whole grains, and vegetables. General Instructions  Keep the anal area as clean and dry as possible.  Take sitz baths as told by your health care provider. Do not use soap in the sitz baths.  Take  over-the-counter and prescription medicines only as told by your health care provider.  Use creams or ointments only as told by your health care provider.  Keep all follow-up visits as told by your health care provider. This is important. SEEK MEDICAL CARE IF:  You have more bleeding.  You have a fever.  You have diarrhea that is mixed with blood.  You continue to have pain.  Your problem is getting worse rather than better.   This information is not intended to replace advice given to you by your health care provider. Make sure you discuss any questions you have with your health care provider.   Document Released: 07/20/2005 Document Revised: 04/10/2015 Document Reviewed: 10/15/2014 Elsevier Interactive Patient Education [employer].

## 2016-02-27 ENCOUNTER — Encounter: Payer: Medicare Other | Attending: Physical Medicine & Rehabilitation

## 2016-02-27 ENCOUNTER — Encounter: Payer: Self-pay | Admitting: Physical Medicine & Rehabilitation

## 2016-02-27 ENCOUNTER — Ambulatory Visit (HOSPITAL_BASED_OUTPATIENT_CLINIC_OR_DEPARTMENT_OTHER): Payer: Medicare Other | Admitting: Physical Medicine & Rehabilitation

## 2016-02-27 VITALS — BP 69/42 | HR 81 | Resp 16

## 2016-02-27 DIAGNOSIS — M1712 Unilateral primary osteoarthritis, left knee: Secondary | ICD-10-CM | POA: Insufficient documentation

## 2016-02-27 DIAGNOSIS — G8929 Other chronic pain: Secondary | ICD-10-CM | POA: Diagnosis not present

## 2016-02-27 DIAGNOSIS — M25562 Pain in left knee: Secondary | ICD-10-CM | POA: Diagnosis present

## 2016-02-27 DIAGNOSIS — R079 Chest pain, unspecified: Secondary | ICD-10-CM | POA: Diagnosis not present

## 2016-02-27 NOTE — Progress Notes (Signed)
Subjective:     Patient ID: [name], male   DOB: 12/09/1985, 30 y.o.   MRN: [id number]  HPI Genicular nerve blocks helped on left side Pain relieving effect still working Right knee pain , ED Visit, history of right partial knee surgery on that side. Dr. Marlou Sa  Patient also complains of chest pain, mainly at night when he is laying down. He is contacted his primary care physician but cannot get in for another month or so.  In regards to his chest,He feels like he sometimes gets some reflux. He does not have any chest pain with ambulation. He is been advised by his primary care physician to get a cardiology evaluation. No referral was sent. However  Pain Inventory Average Pain 6 Pain Right Now 4 My pain is intermittent and dull  In the last 24 hours, has pain interfered with the following? General activity 6 Relation with others 8 Enjoyment of life 7 What TIME of day is your pain at its worst? evening Sleep (in general) Good  Pain is worse with: walking Pain improves with: NA Relief from Meds: 2  Mobility walk without assistance how many minutes can you walk? 30 ability to climb steps?  yes do you drive?  yes Do you have any goals in this area?  no  Function disabled: date disabled NA Do you have any goals in this area?  yes  Neuro/Psych dizziness  Prior Studies Any changes since last visit?  yes x-rays  Physicians involved in your care Any changes since last visit?  no   Family History  Problem Relation Age of Onset  . Hypertension Mother   . Diabetes Father   . Kidney Stones Sister   . Diabetes Maternal Grandmother   . Liver cancer Maternal Uncle    Social History   Social History  . Marital status: Single    Spouse name: N/A  . Number of children: 0  . Years of education: N/A   Social History Main Topics  . Smoking status: Former Smoker    Packs/day: 0.20    Years: 13.00    Types: Cigarettes    Quit date: 12/17/2014  . Smokeless tobacco:  Never Used     Comment: 2 cigs a day  . Alcohol use No  . Drug use: No  . Sexual activity: Not Asked   Other Topics Concern  . None   Social History Narrative  . None   Past Surgical History:  Procedure Laterality Date  . AV FISTULA PLACEMENT Bilateral 2005,2007   Uses Right arm  . BREATH TEK H PYLORI N/A 05/15/2014   Procedure: BREATH TEK H PYLORI;  Surgeon: Alphonsa Overall, MD;  Location: [facility];  Service: General;  Laterality: N/A;  . ESOPHAGOGASTRODUODENOSCOPY N/A 12/07/2014   Procedure: ESOPHAGOGASTRODUODENOSCOPY (EGD);  Surgeon: Alphonsa Overall, MD;  Location: [facility];  Service: General;  Laterality: N/A;  . ESOPHAGOGASTRODUODENOSCOPY N/A 12/17/2014   Procedure: ESOPHAGOGASTRODUODENOSCOPY (EGD);  Surgeon: Alphonsa Overall, MD;  Location: [place];  Service: General;  Laterality: N/A;  . ESOPHAGOGASTRODUODENOSCOPY ENDOSCOPY    . KNEE ARTHROSCOPY Left 2007  . LAPAROSCOPIC GASTRIC BANDING N/A 11/12/2014   Procedure: LAPAROSCOPIC GASTRIC BANDING;  Surgeon: Alphonsa Overall, MD;  Location: [facility];  Service: General;  Laterality: N/A;  . PARATHYROIDECTOMY N/A 03/30/2013   Procedure: TOTAL PARATHYROIDECTOMY WITH AUTOTRANSPLANT;  Surgeon: Earnstine Regal, MD;  Location: [place];  Service: General;  Laterality: N/A;  Autotransplant to left lower arm.  Marland Kitchen TOTAL KNEE ARTHROPLASTY Right  03/27/2014   Procedure: UNICOMPARTMENTAL ARTHROPLASTY;  Surgeon: Meredith Pel, MD;  Location: [place];  Service: Orthopedics;  Laterality: Right;   Past Medical History:  Diagnosis Date  . Arthritis   . Complication of anesthesia    A little while to wake up after knee surgery in 2008  . Dizziness    when coming off of dialysis  . Family history of anesthesia complication    mom slow to wake up  . GERD (gastroesophageal reflux disease)    takes Omeprazole as needed  . Headache(784.0)    occasionally  . Hyperparathyroidism ([place])   . Hypertension   . Joint pain   . Joint swelling   . Morbid obesity ([place])    . PONV (postoperative nausea and vomiting)   . Renal disorder    MWF and goes to [facility]  . Renal insufficiency    There were no vitals taken for this visit.  Opioid Risk Score:   Fall Risk Score:  `1  Depression screen PHQ 2/9  Depression screen [facility] 2/9 07/11/2015 11/07/2014  Decreased Interest 2 0  Down, Depressed, Hopeless 1 0  PHQ - 2 Score 3 0  Altered sleeping 2 -  Tired, decreased energy 1 -  Change in appetite 3 -  Feeling bad or failure about yourself  2 -  Trouble concentrating 2 -  Moving slowly or fidgety/restless 1 -  Suicidal thoughts 0 -  PHQ-9 Score 14 -  Difficult doing work/chores Somewhat difficult -    Review of Systems  Constitutional: Positive for unexpected weight change.  Neurological: Positive for dizziness.  All other systems reviewed and are negative.      Objective:   Physical Exam  Constitutional: He is oriented to person, place, and time. He appears well-developed and well-nourished.  HENT:  Head: Normocephalic and atraumatic.  Eyes: Conjunctivae are normal. Pupils are equal, round, and reactive to light.  Cardiovascular: Normal rate and regular rhythm.   Pulmonary/Chest: Effort normal and breath sounds normal.  Musculoskeletal:       Right knee: He exhibits decreased range of motion and deformity. He exhibits no effusion. No tenderness found.       Left knee: He exhibits decreased range of motion and deformity. He exhibits no effusion. No tenderness found.  Bilateral knee valgus deformities  Neurological: He is alert and oriented to person, place, and time.  Psychiatric: He has a normal mood and affect.  Nursing note and vitals reviewed.      Assessment:     1. Chronic bilateral knee pain. Status post right partial knee arthroplasty. Left side has osteoarthritis but no surgeries.  Good result with genicular nerve block on the left side. We offered that as an option for the right side. He like to hold off for now because his  right knee is not bothering him that much anymore. We also discussed that it is difficult to assess how long the genicular nerve block may last.  2. Chest pain appears to be GI in nature. However PCP suggested. Cardiology evaluation. No referral was made.     Plan:     1. Patient is to call back when his left knee pain recurs or if his right knee pain becomes more severe, at that point, I would schedule repeat genicular nerve blocks  2. Patient has been referred to Dr. Einar Gip for cardiology evaluation

## 2016-02-27 NOTE — Patient Instructions (Signed)
Please call once knee pain increases to repeat genicular nerve blocks

## 2016-03-05 ENCOUNTER — Institutional Professional Consult (permissible substitution): Payer: Self-pay | Admitting: Cardiology

## 2016-04-18 ENCOUNTER — Other Ambulatory Visit: Payer: Self-pay | Admitting: Physical Medicine & Rehabilitation

## 2016-05-14 ENCOUNTER — Ambulatory Visit (HOSPITAL_COMMUNITY)
Admission: EM | Admit: 2016-05-14 | Discharge: 2016-05-14 | Disposition: A | Discharge status: Home / Self Care | Payer: Medicare Other | Attending: Internal Medicine | Admitting: Internal Medicine

## 2016-05-14 ENCOUNTER — Encounter (HOSPITAL_COMMUNITY): Payer: Self-pay | Admitting: Emergency Medicine

## 2016-05-14 DIAGNOSIS — J40 Bronchitis, not specified as acute or chronic: Secondary | ICD-10-CM

## 2016-05-14 DIAGNOSIS — R05 Cough: Secondary | ICD-10-CM

## 2016-05-14 DIAGNOSIS — R059 Cough, unspecified: Secondary | ICD-10-CM

## 2016-05-14 MED ORDER — PREDNISONE 10 MG PO TABS: 20.0000 mg | ORAL_TABLET | Freq: Every day | ORAL | 0 refills | Status: DC

## 2016-05-14 MED ORDER — ALBUTEROL SULFATE HFA 108 (90 BASE) MCG/ACT IN AERS: 2.0000 | INHALATION_SPRAY | RESPIRATORY_TRACT | 0 refills | Status: AC | PRN

## 2016-05-14 MED ORDER — BENZONATATE 100 MG PO CAPS
200.0000 mg | ORAL_CAPSULE | Freq: Three times a day (TID) | ORAL | 0 refills | Status: DC | PRN
Start: 2016-05-14 — End: 2016-06-15

## 2016-05-14 MED ORDER — AZITHROMYCIN 250 MG PO TABS: 250.0000 mg | ORAL_TABLET | Freq: Every day | ORAL | 0 refills | Status: DC

## 2016-05-14 NOTE — ED Provider Notes (Signed)
CSN: [id number]     Arrival date & time 05/14/16  4270 History   None    Chief Complaint  Patient presents with  . Cough   (Consider location/radiation/quality/duration/timing/severity/associated sxs/prior Treatment) Patient c/o severe cough and is a smoker.   The history is provided by the patient.  Cough  Cough characteristics:  Non-productive Sputum characteristics:  Clear Severity:  Moderate Timing:  Constant Progression:  Worsening Chronicity:  New Smoker: yes   Relieved by:  Nothing Worsened by:  Nothing Ineffective treatments:  None tried   Past Medical History:  Diagnosis Date  . Arthritis   . Complication of anesthesia    A little while to wake up after knee surgery in 2008  . Dizziness    when coming off of dialysis  . Family history of anesthesia complication    mom slow to wake up  . GERD (gastroesophageal reflux disease)    takes Omeprazole as needed  . Headache(784.0)    occasionally  . Hyperparathyroidism ([place])   . Hypertension   . Joint pain   . Joint swelling   . Morbid obesity ([place])   . PONV (postoperative nausea and vomiting)   . Renal disorder    MWF and goes to [facility]  . Renal insufficiency    Past Surgical History:  Procedure Laterality Date  . AV FISTULA PLACEMENT Bilateral 2005,2007   Uses Right arm  . BREATH TEK H PYLORI N/A 05/15/2014   Procedure: BREATH TEK H PYLORI;  Surgeon: Alphonsa Overall, MD;  Location: [facility];  Service: General;  Laterality: N/A;  . ESOPHAGOGASTRODUODENOSCOPY N/A 12/07/2014   Procedure: ESOPHAGOGASTRODUODENOSCOPY (EGD);  Surgeon: Alphonsa Overall, MD;  Location[facility];  Service: General;  Laterality: N/A;  . ESOPHAGOGASTRODUODENOSCOPY N/A 12/17/2014   Procedure: ESOPHAGOGASTRODUODENOSCOPY (EGD);  Surgeon: Alphonsa Overall, MD;  Location: [place];  Service: General;  Laterality: N/A;  . ESOPHAGOGASTRODUODENOSCOPY ENDOSCOPY    . KNEE ARTHROSCOPY Left 2007  . LAPAROSCOPIC GASTRIC BANDING N/A 11/12/2014   Procedure: LAPAROSCOPIC GASTRIC BANDING;  Surgeon: Alphonsa Overall, MD;  Location: [facility];  Service: General;  Laterality: N/A;  . PARATHYROIDECTOMY N/A 03/30/2013   Procedure: TOTAL PARATHYROIDECTOMY WITH AUTOTRANSPLANT;  Surgeon: Earnstine Regal, MD;  Location: [place];  Service: General;  Laterality: N/A;  Autotransplant to left lower arm.  Marland Kitchen TOTAL KNEE ARTHROPLASTY Right 03/27/2014   Procedure: UNICOMPARTMENTAL ARTHROPLASTY;  Surgeon: Meredith Pel, MD;  Location: [place];  Service: Orthopedics;  Laterality: Right;   Family History  Problem Relation Age of Onset  . Hypertension Mother   . Diabetes Father   . Kidney Stones Sister   . Diabetes Maternal Grandmother   . Liver cancer Maternal Uncle    Social History  Substance Use Topics  . Smoking status: Former Smoker    Packs/day: 0.20    Years: 13.00    Types: Cigarettes    Quit date: 12/17/2014  . Smokeless tobacco: Never Used     Comment: 2 cigs a day  . Alcohol use No    Review of Systems  Constitutional: Negative.   HENT: Negative.   Eyes: Negative.   Respiratory: Positive for cough.   Cardiovascular: Negative.   Gastrointestinal: Negative.   Endocrine: Negative.   Genitourinary: Negative.   Musculoskeletal: Negative.   Skin: Negative.   Allergic/Immunologic: Negative.   Neurological: Negative.   Hematological: Negative.   Psychiatric/Behavioral: Negative.     Allergies  Review of patient's allergies indicates no known allergies.  Home Medications   Prior  to Admission medications   Medication Sig Start Date End Date Taking? Authorizing Provider  calcium carbonate (TUMS - DOSED IN MG ELEMENTAL CALCIUM) 500 MG chewable tablet Chew 2 tablets by mouth 2 (two) times daily as needed for indigestion or heartburn.    Yes Historical Provider, MD  multivitamin (RENA-VIT) TABS tablet Take 1 tablet by mouth daily.   Yes Historical Provider, MD  albuterol (PROVENTIL HFA;VENTOLIN HFA) 108 (90 Base) MCG/ACT inhaler Inhale 2 puffs  into the lungs every 4 (four) hours as needed for wheezing or shortness of breath. 05/14/16   Lysbeth Penner, [name]  azithromycin (ZITHROMAX) 250 MG tablet Take 1 tablet (250 mg total) by mouth daily. Take first 2 tablets together, then 1 every day until finished. 05/14/16   Lysbeth Penner, [name]  benzonatate (TESSALON) 100 MG capsule Take 2 capsules (200 mg total) by mouth 3 (three) times daily as needed for cough. 05/14/16   Lysbeth Penner, [name]  diazepam (VALIUM) 10 MG tablet Take 1 tablet (10 mg total) by mouth every 6 (six) hours as needed for anxiety. 10/01/15   Charlett Blake, MD  docusate sodium (COLACE) 100 MG capsule Take 1 capsule (100 mg total) by mouth every 12 (twelve) hours. 02/19/16   Comer Locket, [name]  glycopyrrolate (ROBINUL) 2 MG tablet Take 1 tablet (2 mg total) by mouth 2 (two) times daily. 01/25/15   Inda Castle, MD  Hyoscyamine Sulfate 0.375 MG TBCR Take 1 tablet twice daily 01/22/15   Inda Castle, MD  lidocaine (XYLOCAINE) 2 % jelly Apply 1 application topically as needed. 02/19/16   Comer Locket, [name]  methocarbamol (ROBAXIN) 500 MG tablet Take 1 tablet (500 mg total) by mouth 2 (two) times daily. 02/04/16   April Palumbo, MD  predniSONE (DELTASONE) 10 MG tablet Take 2 tablets (20 mg total) by mouth daily. 05/14/16   Lysbeth Penner, [name]  sevelamer carbonate (RENVELA) 2.4 G PACK Take 2.4 g by mouth 3 (three) times daily with meals.     Historical Provider, MD  traMADol (ULTRAM) 50 MG tablet TAKE 1 TABLET BY MOUTH EVERY 8 HOURS AS NEEDED 04/20/16   Charlett Blake, MD  VOLTAREN 1 % GEL Apply 4 g topically 4 (four) times daily. 02/04/16   April Palumbo, MD   Meds Ordered and Administered this Visit  Medications - No data to display  BP 108/87 (BP Location: Right Arm)   Pulse 82   Temp 98.2 F (36.8 C) (Oral)   Resp 24   SpO2 98%  No data found.   Physical Exam  Constitutional: He is oriented to person, place, and time. He appears well-developed and  well-nourished.  HENT:  Head: Normocephalic.  Right Ear: External ear normal.  Left Ear: External ear normal.  Mouth/Throat: Oropharynx is clear and moist.  Eyes: Conjunctivae and EOM are normal. Pupils are equal, round, and reactive to light.  Neck: Normal range of motion. Neck supple.  Cardiovascular: Normal rate, regular rhythm and normal heart sounds.   Pulmonary/Chest: Effort normal. He has wheezes.  Abdominal: Soft. Bowel sounds are normal.  Neurological: He is alert and oriented to person, place, and time.  Nursing note and vitals reviewed.   Urgent Care Course   Clinical Course    Procedures (including critical care time)  Labs Review Labs Reviewed - No data to display  Imaging Review No results found.   Visual Acuity Review  Right Eye Distance:   Left Eye Distance:   Bilateral Distance:  Right Eye Near:   Left Eye Near:    Bilateral Near:         MDM   1. Bronchitis   2. Cough    Z-pak as directed #6 Prednisone 10mg  2 po qd x 5 days #10 Tessalon Perles 100mg  one po tid prn #21 Albuterol MDI 2 puffs qid prn #1 inhaler     Lysbeth Penner, [name] 05/14/16 2114    Lysbeth Penner, [place] 05/14/16 2115

## 2016-05-14 NOTE — ED Triage Notes (Signed)
Pt c/o dry cough onset 1 week ++ associated w/SOB, HA and CP due to cough  Reports his PCP gave him antibiotics last week... Has also been taking OTC Theraflu and cough drops w/no relief.   Denies fevers, chills  A&O x4... NAD

## 2016-06-11 ENCOUNTER — Observation Stay (HOSPITAL_COMMUNITY): Payer: Medicare Other

## 2016-06-11 ENCOUNTER — Emergency Department (HOSPITAL_COMMUNITY): Payer: Medicare Other

## 2016-06-11 ENCOUNTER — Encounter (HOSPITAL_COMMUNITY): Payer: Self-pay | Admitting: General Practice

## 2016-06-11 ENCOUNTER — Inpatient Hospital Stay (HOSPITAL_COMMUNITY)
Admission: EM | Admit: 2016-06-11 | Discharge: 2016-06-15 | DRG: 391 | Disposition: A | Discharge status: Home / Self Care | Payer: Medicare Other | Attending: Internal Medicine | Admitting: Internal Medicine

## 2016-06-11 DIAGNOSIS — K8020 Calculus of gallbladder without cholecystitis without obstruction: Secondary | ICD-10-CM

## 2016-06-11 DIAGNOSIS — K760 Fatty (change of) liver, not elsewhere classified: Secondary | ICD-10-CM | POA: Diagnosis present

## 2016-06-11 DIAGNOSIS — R112 Nausea with vomiting, unspecified: Secondary | ICD-10-CM

## 2016-06-11 DIAGNOSIS — E860 Dehydration: Secondary | ICD-10-CM | POA: Diagnosis present

## 2016-06-11 DIAGNOSIS — Z6841 Body Mass Index (BMI) 40.0 and over, adult: Secondary | ICD-10-CM

## 2016-06-11 DIAGNOSIS — K5900 Constipation, unspecified: Secondary | ICD-10-CM | POA: Diagnosis present

## 2016-06-11 DIAGNOSIS — N281 Cyst of kidney, acquired: Secondary | ICD-10-CM | POA: Diagnosis present

## 2016-06-11 DIAGNOSIS — K2970 Gastritis, unspecified, without bleeding: Secondary | ICD-10-CM | POA: Diagnosis not present

## 2016-06-11 DIAGNOSIS — K219 Gastro-esophageal reflux disease without esophagitis: Secondary | ICD-10-CM | POA: Diagnosis present

## 2016-06-11 DIAGNOSIS — Z87891 Personal history of nicotine dependence: Secondary | ICD-10-CM

## 2016-06-11 DIAGNOSIS — R1013 Epigastric pain: Secondary | ICD-10-CM

## 2016-06-11 DIAGNOSIS — Z992 Dependence on renal dialysis: Secondary | ICD-10-CM

## 2016-06-11 DIAGNOSIS — I9589 Other hypotension: Secondary | ICD-10-CM | POA: Diagnosis present

## 2016-06-11 DIAGNOSIS — E8889 Other specified metabolic disorders: Secondary | ICD-10-CM | POA: Diagnosis present

## 2016-06-11 DIAGNOSIS — Z7952 Long term (current) use of systemic steroids: Secondary | ICD-10-CM

## 2016-06-11 DIAGNOSIS — R1011 Right upper quadrant pain: Secondary | ICD-10-CM

## 2016-06-11 DIAGNOSIS — N186 End stage renal disease: Secondary | ICD-10-CM

## 2016-06-11 DIAGNOSIS — Z79899 Other long term (current) drug therapy: Secondary | ICD-10-CM

## 2016-06-11 DIAGNOSIS — Z9884 Bariatric surgery status: Secondary | ICD-10-CM

## 2016-06-11 DIAGNOSIS — M170 Bilateral primary osteoarthritis of knee: Secondary | ICD-10-CM | POA: Diagnosis present

## 2016-06-11 DIAGNOSIS — N2581 Secondary hyperparathyroidism of renal origin: Secondary | ICD-10-CM | POA: Diagnosis present

## 2016-06-11 DIAGNOSIS — R739 Hyperglycemia, unspecified: Secondary | ICD-10-CM | POA: Diagnosis present

## 2016-06-11 DIAGNOSIS — R109 Unspecified abdominal pain: Secondary | ICD-10-CM

## 2016-06-11 DIAGNOSIS — D631 Anemia in chronic kidney disease: Secondary | ICD-10-CM | POA: Diagnosis present

## 2016-06-11 DIAGNOSIS — Z96651 Presence of right artificial knee joint: Secondary | ICD-10-CM | POA: Diagnosis present

## 2016-06-11 HISTORY — DX: Dependence on renal dialysis: Z99.2

## 2016-06-11 HISTORY — DX: Low back pain: M54.5

## 2016-06-11 HISTORY — DX: Other chronic pain: G89.29

## 2016-06-11 HISTORY — DX: Low back pain, unspecified: M54.50

## 2016-06-11 HISTORY — DX: End stage renal disease: N18.6

## 2016-06-11 HISTORY — DX: Personal history of other diseases of the digestive system: Z87.19

## 2016-06-11 LAB — CBC WITH DIFFERENTIAL/PLATELET
Basophils Absolute: 0 10*3/uL (ref 0.0–0.1)
Basophils Relative: 0 %
EOS ABS: 0 10*3/uL (ref 0.0–0.7)
Eosinophils Relative: 0 %
HEMATOCRIT: 41 % (ref 39.0–52.0)
HEMOGLOBIN: 14.1 g/dL (ref 13.0–17.0)
LYMPHS ABS: 1.2 10*3/uL (ref 0.7–4.0)
Lymphocytes Relative: 11 %
MCH: 31.3 pg (ref 26.0–34.0)
MCHC: 34.4 g/dL (ref 30.0–36.0)
MCV: 90.9 fL (ref 78.0–100.0)
MONOS PCT: 5 %
Monocytes Absolute: 0.6 10*3/uL (ref 0.1–1.0)
NEUTROS ABS: 9.4 10*3/uL — AB (ref 1.7–7.7)
NEUTROS PCT: 84 %
Platelets: 223 10*3/uL (ref 150–400)
RBC: 4.51 MIL/uL (ref 4.22–5.81)
RDW: 13.9 % (ref 11.5–15.5)
WBC: 11.2 10*3/uL — AB (ref 4.0–10.5)

## 2016-06-11 LAB — COMPREHENSIVE METABOLIC PANEL
ALBUMIN: 4.3 g/dL (ref 3.5–5.0)
ALT: 25 U/L (ref 17–63)
ANION GAP: 18 — AB (ref 5–15)
AST: 23 U/L (ref 15–41)
Alkaline Phosphatase: 58 U/L (ref 38–126)
BUN: 35 mg/dL — ABNORMAL HIGH (ref 6–20)
CHLORIDE: 93 mmol/L — AB (ref 101–111)
CO2: 26 mmol/L (ref 22–32)
Calcium: 8.4 mg/dL — ABNORMAL LOW (ref 8.9–10.3)
Creatinine, Ser: 8.94 mg/dL — ABNORMAL HIGH (ref 0.61–1.24)
GFR calc non Af Amer: 7 mL/min — ABNORMAL LOW (ref >60)
GFR, EST AFRICAN AMERICAN: 8 mL/min — AB (ref >60)
GLUCOSE: 130 mg/dL — AB (ref 65–99)
POTASSIUM: 3.8 mmol/L (ref 3.5–5.1)
SODIUM: 137 mmol/L (ref 135–145)
Total Bilirubin: 0.9 mg/dL (ref 0.3–1.2)
Total Protein: 8.4 g/dL — ABNORMAL HIGH (ref 6.5–8.1)

## 2016-06-11 LAB — LIPASE, BLOOD: Lipase: 30 U/L (ref 11–51)

## 2016-06-11 MED ORDER — PROMETHAZINE HCL 25 MG/ML IJ SOLN
25.0000 mg | Freq: Four times a day (QID) | INTRAMUSCULAR | Status: DC | PRN
  Administered 2016-06-11 – 2016-06-14 (×3): 25 mg via INTRAVENOUS
  Filled 2016-06-11 (×2): qty 1

## 2016-06-11 MED ORDER — ALBUTEROL SULFATE (2.5 MG/3ML) 0.083% IN NEBU: 2.5000 mg | INHALATION_SOLUTION | RESPIRATORY_TRACT | Status: DC | PRN

## 2016-06-11 MED ORDER — ACETAMINOPHEN 325 MG PO TABS: 650.0000 mg | ORAL_TABLET | Freq: Four times a day (QID) | ORAL | Status: DC | PRN

## 2016-06-11 MED ORDER — CIPROFLOXACIN IN D5W 400 MG/200ML IV SOLN
400.0000 mg | INTRAVENOUS | Status: DC
  Administered 2016-06-11: 400 mg via INTRAVENOUS
  Filled 2016-06-11: qty 200

## 2016-06-11 MED ORDER — PROMETHAZINE HCL 25 MG/ML IJ SOLN
25.0000 mg | Freq: Once | INTRAMUSCULAR | Status: AC
  Administered 2016-06-11: 25 mg via INTRAVENOUS
  Filled 2016-06-11: qty 1

## 2016-06-11 MED ORDER — PREDNISONE 20 MG PO TABS
20.0000 mg | ORAL_TABLET | Freq: Every day | ORAL | Status: DC
  Administered 2016-06-12 – 2016-06-15 (×4): 20 mg via ORAL
  Filled 2016-06-11 (×4): qty 1

## 2016-06-11 MED ORDER — SODIUM CHLORIDE 0.9 % IV BOLUS (SEPSIS)
500.0000 mL | Freq: Once | INTRAVENOUS | Status: AC
Start: 2016-06-11 — End: 2016-06-11
  Administered 2016-06-11: 500 mL via INTRAVENOUS

## 2016-06-11 MED ORDER — PIPERACILLIN-TAZOBACTAM 3.375 G IVPB
3.3750 g | Freq: Two times a day (BID) | INTRAVENOUS | Status: DC
  Administered 2016-06-11 – 2016-06-15 (×7): 3.375 g via INTRAVENOUS
  Filled 2016-06-11 (×10): qty 50

## 2016-06-11 MED ORDER — GI COCKTAIL ~~LOC~~
30.0000 mL | Freq: Once | ORAL | Status: AC
  Administered 2016-06-11: 30 mL via ORAL
  Filled 2016-06-11: qty 30

## 2016-06-11 MED ORDER — SODIUM CHLORIDE 0.9 % IV SOLN
INTRAVENOUS | Status: DC
  Administered 2016-06-11: 30 mL via INTRAVENOUS
  Administered 2016-06-13: 05:00:00 via INTRAVENOUS

## 2016-06-11 MED ORDER — FENTANYL CITRATE (PF) 100 MCG/2ML IJ SOLN
50.0000 ug | Freq: Once | INTRAMUSCULAR | Status: AC
  Administered 2016-06-11: 50 ug via INTRAVENOUS
  Filled 2016-06-11: qty 2

## 2016-06-11 MED ORDER — FAMOTIDINE IN NACL 20-0.9 MG/50ML-% IV SOLN
20.0000 mg | Freq: Two times a day (BID) | INTRAVENOUS | Status: DC
  Administered 2016-06-11 – 2016-06-12 (×2): 20 mg via INTRAVENOUS
  Filled 2016-06-11 (×4): qty 50

## 2016-06-11 MED ORDER — METOCLOPRAMIDE HCL 5 MG/ML IJ SOLN
10.0000 mg | Freq: Four times a day (QID) | INTRAMUSCULAR | Status: DC
  Administered 2016-06-11 – 2016-06-15 (×14): 10 mg via INTRAVENOUS
  Filled 2016-06-11 (×14): qty 2

## 2016-06-11 MED ORDER — HEPARIN SODIUM (PORCINE) 5000 UNIT/ML IJ SOLN
5000.0000 [IU] | Freq: Three times a day (TID) | INTRAMUSCULAR | Status: DC
  Administered 2016-06-12 – 2016-06-15 (×10): 5000 [IU] via SUBCUTANEOUS
  Filled 2016-06-11 (×11): qty 1

## 2016-06-11 MED ORDER — TECHNETIUM TC 99M MEBROFENIN IV KIT
5.0000 | PACK | Freq: Once | INTRAVENOUS | Status: AC | PRN
  Administered 2016-06-11: 5 via INTRAVENOUS

## 2016-06-11 MED ORDER — ACETAMINOPHEN 650 MG RE SUPP: 650.0000 mg | Freq: Four times a day (QID) | RECTAL | Status: DC | PRN

## 2016-06-11 MED ORDER — METHOCARBAMOL 1000 MG/10ML IJ SOLN
500.0000 mg | Freq: Two times a day (BID) | INTRAVENOUS | Status: DC
  Administered 2016-06-11: 500 mg via INTRAVENOUS
  Filled 2016-06-11 (×4): qty 5

## 2016-06-11 MED ORDER — FENTANYL CITRATE (PF) 100 MCG/2ML IJ SOLN
50.0000 ug | Freq: Three times a day (TID) | INTRAMUSCULAR | Status: DC | PRN
  Administered 2016-06-12 – 2016-06-14 (×3): 50 ug via INTRAVENOUS
  Filled 2016-06-11 (×2): qty 2

## 2016-06-11 MED ORDER — DICLOFENAC SODIUM 1 % TD GEL
4.0000 g | Freq: Four times a day (QID) | TRANSDERMAL | Status: DC
  Administered 2016-06-13 – 2016-06-15 (×7): 4 g via TOPICAL
  Filled 2016-06-11 (×2): qty 100

## 2016-06-11 MED ORDER — ONDANSETRON HCL 4 MG/2ML IJ SOLN
4.0000 mg | Freq: Four times a day (QID) | INTRAMUSCULAR | Status: DC | PRN
  Administered 2016-06-11 – 2016-06-14 (×4): 4 mg via INTRAVENOUS
  Filled 2016-06-11 (×3): qty 2

## 2016-06-11 MED ORDER — SEVELAMER CARBONATE 2.4 G PO PACK
2.4000 g | PACK | Freq: Three times a day (TID) | ORAL | Status: DC
  Administered 2016-06-13 – 2016-06-15 (×5): 2.4 g via ORAL
  Filled 2016-06-11 (×12): qty 1

## 2016-06-11 MED ORDER — DIAZEPAM 5 MG PO TABS: 10.0000 mg | ORAL_TABLET | Freq: Four times a day (QID) | ORAL | Status: DC | PRN

## 2016-06-11 MED ORDER — KETOROLAC TROMETHAMINE 15 MG/ML IJ SOLN
15.0000 mg | Freq: Four times a day (QID) | INTRAMUSCULAR | Status: DC
  Administered 2016-06-11 – 2016-06-15 (×15): 15 mg via INTRAVENOUS
  Filled 2016-06-11 (×15): qty 1

## 2016-06-11 MED ORDER — ONDANSETRON HCL 4 MG/2ML IJ SOLN
4.0000 mg | Freq: Once | INTRAMUSCULAR | Status: AC
Start: 2016-06-11 — End: 2016-06-11
  Administered 2016-06-11: 4 mg via INTRAVENOUS
  Filled 2016-06-11: qty 2

## 2016-06-11 NOTE — Progress Notes (Signed)
Pharmacy Antibiotic Note  [name] is a 30 y.o. male admitted on 06/11/2016 with intra-abdominal infxn.  Pharmacy has been consulted for ciprofloxacin dosing. Pt is afebrile and WBC is minimally elevated at 11.2. Pt is ESRD on HD.   Plan: - Cipro 400mg  IV Q24H - F/u renal plans, C&S, clinical status and LOT *Pharmacy will sign off as no dose adjustments are anticipated. Thank you for the consult!  Weight: (!) 304 lb 3.8 oz (138 kg) (dry wt at dialysis yesterday)  Temp (24hrs), Avg:98.4 F (36.9 C), Min:98.3 F (36.8 C), Max:98.5 F (36.9 C)   Recent Labs Lab 06/11/16 0810  WBC 11.2*  CREATININE 8.94*    Estimated Creatinine Clearance: 15.8 mL/min (by C-G formula based on SCr of 8.94 mg/dL (H)).    No Known Allergies  Antimicrobials this admission: Cipro 11/9>>  Dose adjustments this admission: N/A  Microbiology results: Pending  Thank you for allowing pharmacy to be a part of this patient's care.  [name], [name] 06/11/2016 2:36 PM

## 2016-06-11 NOTE — ED Notes (Signed)
Pt transported to xray 

## 2016-06-11 NOTE — ED Notes (Signed)
Pt is aware he will be next to go to [place].  Is resting comfortably

## 2016-06-11 NOTE — ED Triage Notes (Addendum)
Was well all day yesterday until 1800, began feeling nauseated.  Vomiting x 7 since midnight. Dialysis pt, M-W-F.  Went yesterday.

## 2016-06-11 NOTE — ED Notes (Signed)
Resting, continues to wait on [place].

## 2016-06-11 NOTE — ED Notes (Signed)
Patient transported to X-ray 

## 2016-06-11 NOTE — H&P (Signed)
History and Physical    [name] BTY:[id number] DOB: 1985-10-04 DOA: 06/11/2016   PCP: Philis Fendt, MD   Patient coming from/Resides with: Private residence/lives with mother, siblings and nieces and nephews  Admission status: Observation/mid-surgical floor -needs to be reevaluated within the next 24 hours to determine if it will be medically necessary to stay a minimum 2 midnights to rule out impending and/or unexpected changes in physiologic status that may differ from initial evaluation performed in the ER and/or at time of admission. Patient presents with acute right upper quadrant abdominal pain with associated nausea and vomiting and no diarrhea. Ultrasound demonstrates cholelithiasis without evidence of acute cholecystitis but exam is concerning for acute cholecystitis prompting surgical consultation and HIDA scan. Patient has been made nothing by mouth and started on IV fluids (very low rate since is dialysis patient), IV nonnarcotic analgesics until HIDA scan complete, IV antibiotics with Cipro and Flagyl.  Chief Complaint: Abrupt onset right upper quadrant abdominal pain  HPI: [name] is a 30 y.o. male with medical history significant for chronic kidney disease on dialysis, morbid obesity with a BMI 47, chronic arthritis of the knees, secondary hyperparathyroidism, history of laparoscopic banding procedure to 80 and weight loss with band subsequently surgically removed. Patient reports that he began having significant abdominal pain with nausea and vomiting after eating a hotdog and vegetables last night. He has been unable to keep any food down. He denies diarrhea. He reports that he was constipated yesterday and unable to pass a bowel movement but is passing flatus. He has no sick contacts with similar symptoms..  ED Course:  Vital Signs: BP 117/75   Pulse 85   Temp 98.5 F (36.9 C) (Oral)   Wt (!) 138 kg (304 lb 3.8 oz) Comment: dry wt at dialysis yesterday  SpO2  98%   BMI 49.10 kg/m  AAS: No acute abnormality on chest x-ray were abdominal films Abdominal ultrasound: Multiple gallstones with normal common bile duct diameter at 5 mm, no evidence of acute cholecystitis, increased hepatic echotexture consistent with fatty infiltration of the liver Lab data: Sodium 137, potassium 3.8, chloride 93, CO2 26, BUN 35, creatinine 8.94, glucose 1:30, anion gap 18 LFTs are normal, white count 11,200 with neutrophils 84% and absolute neutrophils 9.4%, hemoglobin 14.1, platelets 223,000. Medications and treatments: Normal saline bolus 500 mL 1, Phenergan 25 mg IV 1, GI cocktail 30 mL 1, fentanyl 50 g IV 1, Zofran 4 mg IV 1  Review of Systems:  In addition to the HPI above,  No Fever-chills, myalgias or other constitutional symptoms No Headache, changes with Vision or hearing, new weakness, tingling, numbness in any extremity, dizziness, dysarthria or word finding difficulty, gait disturbance or imbalance, tremors or seizure activity No problems swallowing food or Liquids, indigestion/reflux, choking or coughing while eating No Chest pain, Cough or Shortness of Breath, palpitations, orthopnea or DOE No melena,hematochezia, dark tarry stools No dysuria, malodorous urine, hematuria or flank pain No new skin rashes, lesions, masses or bruises, No new joint pains, aches, swelling or redness No recent unintentional weight gain or loss No polyuria, polydypsia or polyphagia   Past Medical History:  Diagnosis Date  . Arthritis   . Complication of anesthesia    A little while to wake up after knee surgery in 2008  . Dizziness    when coming off of dialysis  . Family history of anesthesia complication    mom slow to wake up  . GERD (gastroesophageal reflux disease)  takes Omeprazole as needed  . Headache(784.0)    occasionally  . Hyperparathyroidism ([place])   . Hypertension   . Joint pain   . Joint swelling   . Morbid obesity ([place])   . PONV  (postoperative nausea and vomiting)   . Renal disorder    MWF and goes to [facility]  . Renal insufficiency     Past Surgical History:  Procedure Laterality Date  . AV FISTULA PLACEMENT Bilateral 2005,2007   Uses Right arm  . BREATH TEK H PYLORI N/A 05/15/2014   Procedure: BREATH TEK H PYLORI;  Surgeon: Alphonsa Overall, MD;  Location: [facility];  Service: General;  Laterality: N/A;  . ESOPHAGOGASTRODUODENOSCOPY N/A 12/07/2014   Procedure: ESOPHAGOGASTRODUODENOSCOPY (EGD);  Surgeon: Alphonsa Overall, MD;  Location: [facility];  Service: General;  Laterality: N/A;  . ESOPHAGOGASTRODUODENOSCOPY N/A 12/17/2014   Procedure: ESOPHAGOGASTRODUODENOSCOPY (EGD);  Surgeon: Alphonsa Overall, MD;  Location: [place];  Service: General;  Laterality: N/A;  . ESOPHAGOGASTRODUODENOSCOPY ENDOSCOPY    . KNEE ARTHROSCOPY Left 2007  . LAPAROSCOPIC GASTRIC BANDING N/A 11/12/2014   Procedure: LAPAROSCOPIC GASTRIC BANDING;  Surgeon: Alphonsa Overall, MD;  Location: [facility];  Service: General;  Laterality: N/A;  . PARATHYROIDECTOMY N/A 03/30/2013   Procedure: TOTAL PARATHYROIDECTOMY WITH AUTOTRANSPLANT;  Surgeon: Earnstine Regal, MD;  Location: [place];  Service: General;  Laterality: N/A;  Autotransplant to left lower arm.  Marland Kitchen TOTAL KNEE ARTHROPLASTY Right 03/27/2014   Procedure: UNICOMPARTMENTAL ARTHROPLASTY;  Surgeon: Meredith Pel, MD;  Location: [place];  Service: Orthopedics;  Laterality: Right;    Social History   Social History  . Marital status: Single    Spouse name: N/A  . Number of children: 0  . Years of education: N/A   Occupational History  . Not on file.   Social History Main Topics  . Smoking status: Former Smoker    Packs/day: 0.20    Years: 13.00    Types: Cigarettes    Quit date: 12/17/2014  . Smokeless tobacco: Never Used     Comment: 2 cigs a day  . Alcohol use No  . Drug use: No  . Sexual activity: Not on file   Other Topics Concern  . Not on file   Social History Narrative  . No  narrative on file    Mobility: Without assistive devices Work history: Disabled   No Known Allergies  Family History  Problem Relation Age of Onset  . Hypertension Mother   . Diabetes Father   . Kidney Stones Sister   . Diabetes Maternal Grandmother   . Liver cancer Maternal Uncle      Prior to Admission medications   Medication Sig Start Date End Date Taking? Authorizing Provider  albuterol (PROVENTIL HFA;VENTOLIN HFA) 108 (90 Base) MCG/ACT inhaler Inhale 2 puffs into the lungs every 4 (four) hours as needed for wheezing or shortness of breath. 05/14/16  Yes Lysbeth Penner, [name]  calcium carbonate (TUMS - DOSED IN MG ELEMENTAL CALCIUM) 500 MG chewable tablet Chew 2 tablets by mouth 2 (two) times daily as needed for indigestion or heartburn.    Yes Historical Provider, MD  diazepam (VALIUM) 10 MG tablet Take 1 tablet (10 mg total) by mouth every 6 (six) hours as needed for anxiety. 10/01/15  Yes Charlett Blake, MD  docusate sodium (COLACE) 100 MG capsule Take 1 capsule (100 mg total) by mouth every 12 (twelve) hours. 02/19/16  Yes Benjamin Cartner, [name]  glycopyrrolate (ROBINUL) 2 MG tablet Take 1  tablet (2 mg total) by mouth 2 (two) times daily. 01/25/15  Yes Inda Castle, MD  Hyoscyamine Sulfate 0.375 MG TBCR Take 1 tablet twice daily 01/22/15  Yes Inda Castle, MD  lidocaine (XYLOCAINE) 2 % jelly Apply 1 application topically as needed. 02/19/16  Yes Benjamin Cartner, [name]  methocarbamol (ROBAXIN) 500 MG tablet Take 1 tablet (500 mg total) by mouth 2 (two) times daily. 02/04/16  Yes April Palumbo, MD  multivitamin (RENA-VIT) TABS tablet Take 1 tablet by mouth daily.   Yes Historical Provider, MD  predniSONE (DELTASONE) 10 MG tablet Take 2 tablets (20 mg total) by mouth daily. 05/14/16  Yes Lysbeth Penner, [name]  sevelamer carbonate (RENVELA) 2.4 G PACK Take 2.4 g by mouth 3 (three) times daily with meals.    Yes Historical Provider, MD  traMADol (ULTRAM) 50 MG tablet TAKE 1  TABLET BY MOUTH EVERY 8 HOURS AS NEEDED 04/20/16  Yes Charlett Blake, MD  VOLTAREN 1 % GEL Apply 4 g topically 4 (four) times daily. 02/04/16  Yes April Palumbo, MD  azithromycin (ZITHROMAX) 250 MG tablet Take 1 tablet (250 mg total) by mouth daily. Take first 2 tablets together, then 1 every day until finished. Patient not taking: Reported on 06/11/2016 05/14/16   Lysbeth Penner, [name]  benzonatate (TESSALON) 100 MG capsule Take 2 capsules (200 mg total) by mouth 3 (three) times daily as needed for cough. Patient not taking: Reported on 06/11/2016 05/14/16   Lysbeth Penner, [name]    Physical Exam: Vitals:   06/11/16 1358 06/11/16 1400 06/11/16 1430 06/11/16 1500  BP: 118/80 118/80 130/78 117/75  Pulse: 86 86 87 85  Temp: 98.5 F (36.9 C)     TempSrc: Oral     SpO2: 96% 96% 96% 98%  Weight:          Constitutional: NAD, Restless, uncomfortable 2/2 ongoing right upper quadrant abdominal pain Eyes: PERRL, lids and conjunctivae normal ENMT: Mucous membranes are dry. Posterior pharynx clear of any exudate or lesions.Normal dentition.  Neck: normal, supple, no masses, no thyromegaly Respiratory: clear to auscultation bilaterally, no wheezing, no crackles. Normal respiratory effort. No accessory muscle use.  Cardiovascular: Regular rate and rhythm, no murmurs / rubs / gallops. No extremity edema. 2+ pedal pulses. No carotid bruits.  Abdomen: Exquisitely tender in the right upper quadrant with minimal palpation with guarding and slight rebounding,, no masses palpated. Given obesity unable to appreciate if has hepatosplenomegaly. Bowel sounds positive but hypoactive.  Musculoskeletal: no clubbing / cyanosis. No joint deformity upper and lower extremities. Good ROM, no contractures. Normal muscle tone.  Skin: no rashes, lesions, ulcers. No induration Neurologic: CN 2-12 grossly intact. Sensation intact, DTR normal. Strength 5/5 x all 4 extremities.  Psychiatric: Normal judgment and insight.  Alert and oriented x 3. Normal mood is appropriate to current situation.    Labs on Admission: I have personally reviewed following labs and imaging studies  CBC:  Recent Labs Lab 06/11/16 0810  WBC 11.2*  NEUTROABS 9.4*  HGB 14.1  HCT 41.0  MCV 90.9  PLT 124   Basic Metabolic Panel:  Recent Labs Lab 06/11/16 0810  NA 137  K 3.8  CL 93*  CO2 26  GLUCOSE 130*  BUN 35*  CREATININE 8.94*  CALCIUM 8.4*   GFR: Estimated Creatinine Clearance: 15.8 mL/min (by C-G formula based on SCr of 8.94 mg/dL (H)). Liver Function Tests:  Recent Labs Lab 06/11/16 0810  AST 23  ALT 25  ALKPHOS  58  BILITOT 0.9  PROT 8.4*  ALBUMIN 4.3    Recent Labs Lab 06/11/16 0810  LIPASE 30   No results for input(s): AMMONIA in the last 168 hours. Coagulation Profile: No results for input(s): INR, PROTIME in the last 168 hours. Cardiac Enzymes: No results for input(s): CKTOTAL, CKMB, CKMBINDEX, TROPONINI in the last 168 hours. BNP (last 3 results) No results for input(s): PROBNP in the last 8760 hours. HbA1C: No results for input(s): HGBA1C in the last 72 hours. CBG: No results for input(s): GLUCAP in the last 168 hours. Lipid Profile: No results for input(s): CHOL, HDL, LDLCALC, TRIG, CHOLHDL, LDLDIRECT in the last 72 hours. Thyroid Function Tests: No results for input(s): TSH, T4TOTAL, FREET4, T3FREE, THYROIDAB in the last 72 hours. Anemia Panel: No results for input(s): VITAMINB12, FOLATE, FERRITIN, TIBC, IRON, RETICCTPCT in the last 72 hours. Urine analysis:    Component Value Date/Time   LABSPEC 1.010 01/08/2010 1817   PHURINE >=9.0 01/08/2010 1817   GLUCOSEU 100 (A) 01/08/2010 1817   HGBUR LARGE (A) 01/08/2010 1817   BILIRUBINUR LARGE (A) 01/08/2010 1817   KETONESUR 15 (A) 01/08/2010 1817   PROTEINUR >=300 (A) 01/08/2010 1817   UROBILINOGEN >=8.0 01/08/2010 1817   NITRITE POSITIVE (A) 01/08/2010 1817   LEUKOCYTESUR (A) 01/08/2010 1817    LARGE Biochemical Testing  Only. Please order routine urinalysis from main lab if confirmatory testing is needed.   Sepsis Labs: @LABRCNTIP (procalcitonin:4,lacticidven:4) )No results found for this or any previous visit (from the past 240 hour(s)).   Radiological Exams on Admission: US Abdomen Complete  Result Date: 06/11/2016 CLINICAL DATA:  Onset of upper abdominal pain last night. EXAM: ABDOMEN ULTRASOUND COMPLETE COMPARISON:  Abdominal and pelvic CT scan of Dec 27, 2014 FINDINGS: The study is limited overall due to the patient's body habitus. Gallbladder: There are multiple gallstones present with the largest measuring 6 cm in diameter. There is no gallbladder wall thickening, pericholecystic fluid, or positive sonographic Murphy's sign. Common bile duct: Diameter: 5 mm Liver: The hepatic echotexture is mildly increased diffusely. There is no focal mass nor ductal dilation. IVC: No abnormality visualized. Pancreas: The pancreas was largely obscured by bowel gas. The visualized portions of the pancreatic head and body are normal. Spleen: Size and appearance within normal limits. Right Kidney: Length: 9.3 cm. The renal cortical echotexture is increased diffusely. There are mid and lower pole cortical cysts measuring up to 1.3 cm in diameter. There is no hydronephrosis. Left Kidney: Length: 9.7 cm. The renal cortical echotexture is diffusely increased similar to that of the right kidney. There is an upper pole cyst measuring 2.9 x 3.4 x 2.7 cm. A second cyst located medially measures 1.6 x 1.8 x 2.1 cm. There is no hydronephrosis. Abdominal aorta: No aneurysm visualized. The bifurcation was obscured by bowel gas. Other findings: No ascites is observed. IMPRESSION: 1. Gallstones without sonographic evidence of acute cholecystitis. Increased hepatic echotexture consistent with fatty infiltration. 2. Bilateral renal cortical atrophy and increased cortical echotexture consistent with medical renal disease. Bilateral simple appearing  renal cysts. No hydronephrosis. 3. Bowel gas obscured the aortic bifurcation. The visualized portions of the abdominal aorta are normal in caliber. Electronically Signed   By: David  Martinique M.D.   On: 06/11/2016 13:57   Dg Abd Acute W/chest  Result Date: 06/11/2016 CLINICAL DATA:  Nausea and vomiting beginning 1 night ago. EXAM: DG ABDOMEN ACUTE W/ 1V CHEST COMPARISON:  Chest in two views abdomen 12/31/2014. FINDINGS: Single-view of the chest demonstrates clear  lungs. Heart size is upper normal. Lung volumes are somewhat low. No pneumothorax or pleural effusion. Two views of the abdomen show no free intraperitoneal air. The bowel gas pattern is unremarkable. No abnormal abdominal calcification is seen. No focal bony lesion is identified. All imaged bones demonstrate somewhat increased density suggested renal osteodystrophy in this patient with a history of renal insufficiency. IMPRESSION: No acute abnormality chest or abdomen. Electronically Signed   By: Inge Rise M.D.   On: 06/11/2016 09:01     Assessment/Plan Principal Problem:   Abdominal pain/ Nausea & vomiting/Cholelithiasis -Patient presents with abrupt onset of right upper quadrant abdominal pain associated with nausea and vomiting after eating a hot dog last night. Ultrasound does reveal gallstones without evidence of acute cholecystitis and he does not have any transaminitis on labs but physical exam is concerning for acute cholecystitis. Abdominal films show no free air or other concerning findings. -NPO -HIDA scan -Nonnarcotic IV Toradol until HIDA scan completed then can resume IV narcotic analgesics if pain remains an issue -Low rate IV fluids at 30 mL per hour since dialysis patient -Discussed with surgery and recommend IV Zosyn-Will ask pharmacy to dose -Surgery has been consulted -Zofran and Phenergan for nausea -IV Pepcid every 12 hours -No blood in emesis and is passing flatus  Active Problems:   CKD (chronic kidney  disease) stage V requiring chronic dialysis  -Completed a full dialysis session yesterday on 11/8 -Anion gap as well as creatinine up from typical baseline post dialysis which is likely related to dehydration and recurrent nausea and vomiting -Typical dialysis days MWF -Nephrology has been notified of admission    Dehydration, mild -Low rate normal saline at 30 mL per hour -Monitor for volume overload    Morbid obesity, weight 291, BMI - 47 -History of prior lap banding and patient reports band has been removed -Patient states does not have sleep apnea    Acute hyperglycemia -Likely from acute stressors and pain -Check hemoglobin A1c    Hepatic steatosis -Incidental finding on CT -Current LFTs within normal limits -Likely either familial versus related to persistent obesity      DVT prophylaxis: Subcutaneous heparin Code Status: Full Family Communication: No family at bedside Disposition Plan: Anticipate discharge back to preadmission home environment once medically stable Consults called: Gen. Surgery/[place]; Nephrology/[place]    Jude Naclerio L. ANP-BC [facility] Pager [phone]   If 7PM-7AM, please contact night-coverage [email or web address] Password [email or web address]  06/11/2016, 3:59 PM

## 2016-06-11 NOTE — ED Notes (Signed)
Returned from [facility]. Reports pain decreased.  Continues to moan.

## 2016-06-11 NOTE — Consult Note (Signed)
Called after pt admitted in observation status for abd pain and N/V.  Afebrile. VSS.    CCS to evaluate, concern for cholecystitis.  Labs reviewed, stable. K 3.8, HCO3 26, BUN 35.  Hb 14.1.  WBC 11.2  Last HD 11/8, full time 4kg above EDW; typical for him  Will plan for HD tomorrow. FulL Consult if moves to full admission.   Outpt HD Orders Unit: [facility] Days: MWF Time: 4h 74min Dialyzer: F200 EDW: 134.5kg K/Ca: 2/2.5 Access: LUE AVF Needle Size: 15g BFR/DFR: 450/800 UF Proflie: none VDRA: Calcitriol 0.78mcg TIW EPO: none IV Fe: none Heparin: 12,000 IVB

## 2016-06-11 NOTE — Consult Note (Signed)
Reason for Consult:  Possible cholecystitis Referring Physician: Khoen, Genet is an 30 y.o. male.   CC: nausea and vomiting since 1800 yesterday HPI: Pt went to HD yesterday and did well.  He had a hot dog and veggies for supper.  Around 6 PM he started having pain, nausea and started vomiting.  He does not usually have issues after HD.  No hx of gallstones, no hx of prior attacks.  Work up shows he is afebrile, VSS, 138 Kg.  Creatinine is 8.9 K+ 3.8, WBC is 11.2.  3 way CXR/abd unremarkable.  Ultrasound of the abdomen shows: 1. Gallstones without sonographic evidence of acute cholecystitis. Increased hepatic echotexture consistent with fatty infiltration. 2. Bilateral renal cortical atrophy and increased cortical echotexture consistent with medical renal disease. Bilateral simple appearing renal cysts. No hydronephrosis. 3. Bowel gas obscured the aortic bifurcation. The visualized portions of the abdominal aorta are normal in caliber.   We are ask to see.  HIDA scan is pending.  Past Medical History:  Diagnosis Date  . Arthritis   . Complication of anesthesia    A little while to wake up after knee surgery in 2008  . Dizziness    when coming off of dialysis  . Family history of anesthesia complication    mom slow to wake up  . GERD (gastroesophageal reflux disease)    takes Omeprazole as needed  . Headache(784.0)    occasionally  . Hyperparathyroidism ([place])   . Hypertension   . Joint pain   . Joint swelling   . Morbid obesity ([place])   . PONV (postoperative nausea and vomiting)   . Renal disorder    MWF and goes to [facility]  . Renal insufficiency     Past Surgical History:  Procedure Laterality Date  . AV FISTULA PLACEMENT Bilateral 2005,2007   Uses Right arm  . BREATH TEK H PYLORI N/A 05/15/2014   Procedure: BREATH TEK H PYLORI;  Surgeon: Alphonsa Overall, MD;  Location: [facility];  Service: General;  Laterality: N/A;  . ESOPHAGOGASTRODUODENOSCOPY N/A  12/07/2014   Procedure: ESOPHAGOGASTRODUODENOSCOPY (EGD);  Surgeon: Alphonsa Overall, MD;  Location[facility];  Service: General;  Laterality: N/A;  . ESOPHAGOGASTRODUODENOSCOPY N/A 12/17/2014   Procedure: ESOPHAGOGASTRODUODENOSCOPY (EGD);  Surgeon: Alphonsa Overall, MD;  Location: [place];  Service: General;  Laterality: N/A;  . ESOPHAGOGASTRODUODENOSCOPY ENDOSCOPY    . KNEE ARTHROSCOPY Left 2007  . LAPAROSCOPIC GASTRIC BANDING N/A 11/12/2014   Procedure: LAPAROSCOPIC GASTRIC BANDING;  Surgeon: Alphonsa Overall, MD;  Location: [facility];  Service: General;  Laterality: N/A;  . PARATHYROIDECTOMY N/A 03/30/2013   Procedure: TOTAL PARATHYROIDECTOMY WITH AUTOTRANSPLANT;  Surgeon: Earnstine Regal, MD;  Location: [place];  Service: General;  Laterality: N/A;  Autotransplant to left lower arm.  Marland Kitchen TOTAL KNEE ARTHROPLASTY Right 03/27/2014   Procedure: UNICOMPARTMENTAL ARTHROPLASTY;  Surgeon: Meredith Pel, MD;  Location: [place];  Service: Orthopedics;  Laterality: Right;    Family History  Problem Relation Age of Onset  . Hypertension Mother   . Diabetes Father   . Kidney Stones Sister   . Diabetes Maternal Grandmother   . Liver cancer Maternal Uncle     Social History:  reports that he quit smoking about 17 months ago. His smoking use included Cigarettes. He has a 2.60 pack-year smoking history. He has never used smokeless tobacco. He reports that he does not drink alcohol or use drugs.  Allergies: No Known Allergies  Prior to Admission medications  Medication Sig Start Date End Date Taking? Authorizing Provider  albuterol (PROVENTIL HFA;VENTOLIN HFA) 108 (90 Base) MCG/ACT inhaler Inhale 2 puffs into the lungs every 4 (four) hours as needed for wheezing or shortness of breath. 05/14/16  Yes Lysbeth Penner, [name]  calcium carbonate (TUMS - DOSED IN MG ELEMENTAL CALCIUM) 500 MG chewable tablet Chew 2 tablets by mouth 2 (two) times daily as needed for indigestion or heartburn.    Yes Historical Provider, MD   diazepam (VALIUM) 10 MG tablet Take 1 tablet (10 mg total) by mouth every 6 (six) hours as needed for anxiety. 10/01/15  Yes Charlett Blake, MD  docusate sodium (COLACE) 100 MG capsule Take 1 capsule (100 mg total) by mouth every 12 (twelve) hours. 02/19/16  Yes Benjamin Cartner, [name]  glycopyrrolate (ROBINUL) 2 MG tablet Take 1 tablet (2 mg total) by mouth 2 (two) times daily. 01/25/15  Yes Inda Castle, MD  Hyoscyamine Sulfate 0.375 MG TBCR Take 1 tablet twice daily 01/22/15  Yes Inda Castle, MD  lidocaine (XYLOCAINE) 2 % jelly Apply 1 application topically as needed. 02/19/16  Yes Benjamin Cartner, [name]  methocarbamol (ROBAXIN) 500 MG tablet Take 1 tablet (500 mg total) by mouth 2 (two) times daily. 02/04/16  Yes April Palumbo, MD  multivitamin (RENA-VIT) TABS tablet Take 1 tablet by mouth daily.   Yes Historical Provider, MD  predniSONE (DELTASONE) 10 MG tablet Take 2 tablets (20 mg total) by mouth daily. 05/14/16  Yes Lysbeth Penner, [name]  sevelamer carbonate (RENVELA) 2.4 G PACK Take 2.4 g by mouth 3 (three) times daily with meals.    Yes Historical Provider, MD  traMADol (ULTRAM) 50 MG tablet TAKE 1 TABLET BY MOUTH EVERY 8 HOURS AS NEEDED 04/20/16  Yes Charlett Blake, MD  VOLTAREN 1 % GEL Apply 4 g topically 4 (four) times daily. 02/04/16  Yes April Palumbo, MD  azithromycin (ZITHROMAX) 250 MG tablet Take 1 tablet (250 mg total) by mouth daily. Take first 2 tablets together, then 1 every day until finished. Patient not taking: Reported on 06/11/2016 05/14/16   Lysbeth Penner, [name]  benzonatate (TESSALON) 100 MG capsule Take 2 capsules (200 mg total) by mouth 3 (three) times daily as needed for cough. Patient not taking: Reported on 06/11/2016 05/14/16   Lysbeth Penner, [name]     Results for orders placed or performed during the hospital encounter of 06/11/16 (from the past 48 hour(s))  Comprehensive metabolic panel     Status: Abnormal   Collection Time: 06/11/16  8:10 AM  Result  Value Ref Range   Sodium 137 135 - 145 mmol/L   Potassium 3.8 3.5 - 5.1 mmol/L   Chloride 93 (L) 101 - 111 mmol/L   CO2 26 22 - 32 mmol/L   Glucose, Bld 130 (H) 65 - 99 mg/dL   BUN 35 (H) 6 - 20 mg/dL   Creatinine, Ser 8.94 (H) 0.61 - 1.24 mg/dL   Calcium 8.4 (L) 8.9 - 10.3 mg/dL   Total Protein 8.4 (H) 6.5 - 8.1 g/dL   Albumin 4.3 3.5 - 5.0 g/dL   AST 23 15 - 41 U/L   ALT 25 17 - 63 U/L   Alkaline Phosphatase 58 38 - 126 U/L   Total Bilirubin 0.9 0.3 - 1.2 mg/dL   GFR calc non Af Amer 7 (L) >60 mL/min   GFR calc Af Amer 8 (L) >60 mL/min    Comment: (NOTE) The eGFR has been calculated using  the CKD EPI equation. This calculation has not been validated in all clinical situations. eGFR's persistently <60 mL/min signify possible Chronic Kidney Disease.    Anion gap 18 (H) 5 - 15  Lipase, blood     Status: None   Collection Time: 06/11/16  8:10 AM  Result Value Ref Range   Lipase 30 11 - 51 U/L  CBC with Differential     Status: Abnormal   Collection Time: 06/11/16  8:10 AM  Result Value Ref Range   WBC 11.2 (H) 4.0 - 10.5 K/uL   RBC 4.51 4.22 - 5.81 MIL/uL   Hemoglobin 14.1 13.0 - 17.0 g/dL   HCT 41.0 39.0 - 52.0 %   MCV 90.9 78.0 - 100.0 fL   MCH 31.3 26.0 - 34.0 pg   MCHC 34.4 30.0 - 36.0 g/dL   RDW 13.9 11.5 - 15.5 %   Platelets 223 150 - 400 K/uL   Neutrophils Relative % 84 %   Neutro Abs 9.4 (H) 1.7 - 7.7 K/uL   Lymphocytes Relative 11 %   Lymphs Abs 1.2 0.7 - 4.0 K/uL   Monocytes Relative 5 %   Monocytes Absolute 0.6 0.1 - 1.0 K/uL   Eosinophils Relative 0 %   Eosinophils Absolute 0.0 0.0 - 0.7 K/uL   Basophils Relative 0 %   Basophils Absolute 0.0 0.0 - 0.1 K/uL    US Abdomen Complete  Result Date: 06/11/2016 CLINICAL DATA:  Onset of upper abdominal pain last night. EXAM: ABDOMEN ULTRASOUND COMPLETE COMPARISON:  Abdominal and pelvic CT scan of Dec 27, 2014 FINDINGS: The study is limited overall due to the patient's body habitus. Gallbladder: There are  multiple gallstones present with the largest measuring 6 cm in diameter. There is no gallbladder wall thickening, pericholecystic fluid, or positive sonographic Murphy's sign. Common bile duct: Diameter: 5 mm Liver: The hepatic echotexture is mildly increased diffusely. There is no focal mass nor ductal dilation. IVC: No abnormality visualized. Pancreas: The pancreas was largely obscured by bowel gas. The visualized portions of the pancreatic head and body are normal. Spleen: Size and appearance within normal limits. Right Kidney: Length: 9.3 cm. The renal cortical echotexture is increased diffusely. There are mid and lower pole cortical cysts measuring up to 1.3 cm in diameter. There is no hydronephrosis. Left Kidney: Length: 9.7 cm. The renal cortical echotexture is diffusely increased similar to that of the right kidney. There is an upper pole cyst measuring 2.9 x 3.4 x 2.7 cm. A second cyst located medially measures 1.6 x 1.8 x 2.1 cm. There is no hydronephrosis. Abdominal aorta: No aneurysm visualized. The bifurcation was obscured by bowel gas. Other findings: No ascites is observed. IMPRESSION: 1. Gallstones without sonographic evidence of acute cholecystitis. Increased hepatic echotexture consistent with fatty infiltration. 2. Bilateral renal cortical atrophy and increased cortical echotexture consistent with medical renal disease. Bilateral simple appearing renal cysts. No hydronephrosis. 3. Bowel gas obscured the aortic bifurcation. The visualized portions of the abdominal aorta are normal in caliber. Electronically Signed   By: David  Martinique M.D.   On: 06/11/2016 13:57   Dg Abd Acute W/chest  Result Date: 06/11/2016 CLINICAL DATA:  Nausea and vomiting beginning 1 night ago. EXAM: DG ABDOMEN ACUTE W/ 1V CHEST COMPARISON:  Chest in two views abdomen 12/31/2014. FINDINGS: Single-view of the chest demonstrates clear lungs. Heart size is upper normal. Lung volumes are somewhat low. No pneumothorax or  pleural effusion. Two views of the abdomen show no free intraperitoneal air. The  bowel gas pattern is unremarkable. No abnormal abdominal calcification is seen. No focal bony lesion is identified. All imaged bones demonstrate somewhat increased density suggested renal osteodystrophy in this patient with a history of renal insufficiency. IMPRESSION: No acute abnormality chest or abdomen. Electronically Signed   By: Inge Rise M.D.   On: 06/11/2016 09:01    Review of Systems  Constitutional: Negative.   HENT: Negative.   Eyes: Negative.   Respiratory: Negative.   Cardiovascular: Negative.   Gastrointestinal: Positive for abdominal pain, nausea and vomiting. Negative for blood in stool, constipation, diarrhea and melena.  Genitourinary: Negative.        ESRD on HD  Musculoskeletal: Negative.   Skin: Negative.   Neurological: Negative.   Endo/Heme/Allergies: Negative.   Psychiatric/Behavioral: Negative.    Blood pressure 117/75, pulse 85, temperature 98.5 F (36.9 C), temperature source Oral, weight (!) 138 kg (304 lb 3.8 oz), SpO2 98 %. Physical Exam WDWN in NAD Eyes:  Pupils equal, round; sclera anicteric HENT:  Oral mucosa moist; good dentition  Neck:  No masses palpated, no thyromegaly Lungs:  CTA bilaterally; normal respiratory effort CV:  Regular rate and rhythm; no murmurs; extremities well-perfused with no edema Abd:  +bowel sounds, obese, tender in RUQ; unable to palpate for organomegaly due to obesity Skin:  Warm, dry; no sign of jaundice Psychiatric - alert and oriented x 4; calm mood and affect   Assessment/Plan:  Cholelithiasis, possible cholecystitis Plan:  He is getting HIDA scan now.  Cipro ordered.  We could convert him to Zoysn.    JENNINGS,WILLARD 06/11/2016, 3:46 PM    Addendum:  HIDA scan negative for acute cholecystitis or biliary dyskinesia.  CLINICAL DATA:  30 y/o  M; abdominal pain with nausea and vomiting.  EXAM: NUCLEAR MEDICINE HEPATOBILIARY  IMAGING WITH GALLBLADDER EF  TECHNIQUE: Sequential images of the abdomen were obtained out to 60 minutes following intravenous administration of radiopharmaceutical. After slow intravenous infusion of 99 micrograms Cholecystokinin and ingestion of 8 ounces of Ensure, gallbladder ejection fraction was determined.  RADIOPHARMACEUTICALS:  5.2 mCi Tc-13mCholetec IV  COMPARISON:  06/11/2016 abdominal ultrasound. Hepatobiliary scintigraphy dated 12/01/2005  FINDINGS: Prompt uptake and biliary excretion of activity by the liver is seen. Gallbladder activity is visualized, consistent with patency of cystic duct. Biliary activity passes into small bowel, consistent with patent common bile duct.  Calculated gallbladder ejection fraction is 39%, previously 39%. (At 60 min, normal ejection fraction is greater than 35% after cholecystokinin.)  IMPRESSION: 1. Patent cystic and common bile ducts. 2. 39% gallbladder ejection fraction unchanged from 2007. Lower limits of normal.   Electronically Signed   By: LKristine GarbeM.D.   On: 06/11/2016 19:24    MImogene Burn TGeorgette Dover MD, FRed River Behavioral CenterSurgery  General/ Trauma Surgery  06/11/2016 9:19 PM

## 2016-06-11 NOTE — Progress Notes (Signed)
Pharmacy Antibiotic Note  [name] is a 30 y.o. male admitted on 06/11/2016 with abrupt onset of RUQ abdominal pain.  Pharmacy has been consulted for Zosyn dosing for suspected acute cholecystitis.  Patient has ESRD on HD MWF and her last session was on 06/10/16.  Noted Renal plan for dialysis tomorrow.   Plan: - Zosyn 3.375gm IV Q12H, 4 hr infusion - Pharmacy will sign off as dosage adjustment is unnecessary given ESRD.  Thank you for the consult!   Weight: (!) 304 lb 3.8 oz (138 kg) (dry wt at dialysis yesterday)  Temp (24hrs), Avg:98.4 F (36.9 C), Min:98.3 F (36.8 C), Max:98.5 F (36.9 C)   Recent Labs Lab 06/11/16 0810  WBC 11.2*  CREATININE 8.94*    Estimated Creatinine Clearance: 15.8 mL/min (by C-G formula based on SCr of 8.94 mg/dL (H)).    No Known Allergies   [name] D. [name], PharmD, BCPS Pager:  [phone] 06/11/2016, 4:57 PM

## 2016-06-11 NOTE — ED Notes (Signed)
Returned from [facility]/S

## 2016-06-11 NOTE — ED Provider Notes (Signed)
[facility] DEPT Provider Note   CSN: [id number] Arrival date & time: 06/11/16  0751     History   Chief Complaint Chief Complaint  Patient presents with  . Emesis    HPI [name] is a 30 y.o. male.  HPI Patient presents with nausea vomiting abdominal pain. Began last night. States he is vomited 7 times. No diarrhea. The pain is in his upper abdomen and severe. No fevers or chills. Decreased appetite. Has had issues with vomiting and abdominal pain after gastric band, but that has been removed. Still had problems since then. He is a dialysis patient was dialyzed yesterday and had a full run.   Past Medical History:  Diagnosis Date  . Arthritis   . Complication of anesthesia    A little while to wake up after knee surgery in 2008  . Dizziness    when coming off of dialysis  . Family history of anesthesia complication    mom slow to wake up  . GERD (gastroesophageal reflux disease)    takes Omeprazole as needed  . Headache(784.0)    occasionally  . Hyperparathyroidism ([place])   . Hypertension   . Joint pain   . Joint swelling   . Morbid obesity ([place])   . PONV (postoperative nausea and vomiting)   . Renal disorder    MWF and goes to [facility]  . Renal insufficiency     Patient Active Problem List   Diagnosis Date Noted  . Localized osteoarthritis of left knee 07/11/2015  . Abdominal pain, epigastric 12/05/2014  . Lapband April 2016 11/27/2014  . Abdominal pain 11/27/2014  . Arthritis of knee 03/27/2014  . Morbid obesity, weight 291, BMI - 47 02/15/2014  . End stage renal disease ([place]) 07/12/2013  . Hyperparathyroidism, secondary ([place]) 03/14/2013    Past Surgical History:  Procedure Laterality Date  . AV FISTULA PLACEMENT Bilateral 2005,2007   Uses Right arm  . BREATH TEK H PYLORI N/A 05/15/2014   Procedure: BREATH TEK H PYLORI;  Surgeon: Alphonsa Overall, MD;  Location: [facility];  Service: General;  Laterality: N/A;  . ESOPHAGOGASTRODUODENOSCOPY  N/A 12/07/2014   Procedure: ESOPHAGOGASTRODUODENOSCOPY (EGD);  Surgeon: Alphonsa Overall, MD;  Location: [facility];  Service: General;  Laterality: N/A;  . ESOPHAGOGASTRODUODENOSCOPY N/A 12/17/2014   Procedure: ESOPHAGOGASTRODUODENOSCOPY (EGD);  Surgeon: Alphonsa Overall, MD;  Location: [facility];  Service: General;  Laterality: N/A;  . ESOPHAGOGASTRODUODENOSCOPY ENDOSCOPY    . KNEE ARTHROSCOPY Left 2007  . LAPAROSCOPIC GASTRIC BANDING N/A 11/12/2014   Procedure: LAPAROSCOPIC GASTRIC BANDING;  Surgeon: Alphonsa Overall, MD;  Location: [facility];  Service: General;  Laterality: N/A;  . PARATHYROIDECTOMY N/A 03/30/2013   Procedure: TOTAL PARATHYROIDECTOMY WITH AUTOTRANSPLANT;  Surgeon: Earnstine Regal, MD;  Location: [facility];  Service: General;  Laterality: N/A;  Autotransplant to left lower arm.  Marland Kitchen TOTAL KNEE ARTHROPLASTY Right 03/27/2014   Procedure: UNICOMPARTMENTAL ARTHROPLASTY;  Surgeon: Meredith Pel, MD;  Location: [facility];  Service: Orthopedics;  Laterality: Right;       Home Medications    Prior to Admission medications   Medication Sig Start Date End Date Taking? Authorizing Provider  albuterol (PROVENTIL HFA;VENTOLIN HFA) 108 (90 Base) MCG/ACT inhaler Inhale 2 puffs into the lungs every 4 (four) hours as needed for wheezing or shortness of breath. 05/14/16  Yes Lysbeth Penner, [name]  calcium carbonate (TUMS - DOSED IN MG ELEMENTAL CALCIUM) 500 MG chewable tablet Chew 2 tablets by mouth 2 (two) times daily as needed  for indigestion or heartburn.    Yes Historical Provider, MD  diazepam (VALIUM) 10 MG tablet Take 1 tablet (10 mg total) by mouth every 6 (six) hours as needed for anxiety. 10/01/15  Yes Charlett Blake, MD  docusate sodium (COLACE) 100 MG capsule Take 1 capsule (100 mg total) by mouth every 12 (twelve) hours. 02/19/16  Yes Benjamin Cartner, [name]  glycopyrrolate (ROBINUL) 2 MG tablet Take 1 tablet (2 mg total) by mouth 2 (two) times daily. 01/25/15  Yes Inda Castle, MD  Hyoscyamine Sulfate  0.375 MG TBCR Take 1 tablet twice daily 01/22/15  Yes Inda Castle, MD  lidocaine (XYLOCAINE) 2 % jelly Apply 1 application topically as needed. 02/19/16  Yes Benjamin Cartner, [name]  methocarbamol (ROBAXIN) 500 MG tablet Take 1 tablet (500 mg total) by mouth 2 (two) times daily. 02/04/16  Yes April Palumbo, MD  multivitamin (RENA-VIT) TABS tablet Take 1 tablet by mouth daily.   Yes Historical Provider, MD  predniSONE (DELTASONE) 10 MG tablet Take 2 tablets (20 mg total) by mouth daily. 05/14/16  Yes Lysbeth Penner, [name]  sevelamer carbonate (RENVELA) 2.4 G PACK Take 2.4 g by mouth 3 (three) times daily with meals.    Yes Historical Provider, MD  traMADol (ULTRAM) 50 MG tablet TAKE 1 TABLET BY MOUTH EVERY 8 HOURS AS NEEDED 04/20/16  Yes Charlett Blake, MD  VOLTAREN 1 % GEL Apply 4 g topically 4 (four) times daily. 02/04/16  Yes April Palumbo, MD  azithromycin (ZITHROMAX) 250 MG tablet Take 1 tablet (250 mg total) by mouth daily. Take first 2 tablets together, then 1 every day until finished. Patient not taking: Reported on 06/11/2016 05/14/16   Lysbeth Penner, [name]  benzonatate (TESSALON) 100 MG capsule Take 2 capsules (200 mg total) by mouth 3 (three) times daily as needed for cough. Patient not taking: Reported on 06/11/2016 05/14/16   Lysbeth Penner, [name]    Family History Family History  Problem Relation Age of Onset  . Hypertension Mother   . Diabetes Father   . Kidney Stones Sister   . Diabetes Maternal Grandmother   . Liver cancer Maternal Uncle     Social History Social History  Substance Use Topics  . Smoking status: Former Smoker    Packs/day: 0.20    Years: 13.00    Types: Cigarettes    Quit date: 12/17/2014  . Smokeless tobacco: Never Used     Comment: 2 cigs a day  . Alcohol use No     Allergies   Patient has no known allergies.   Review of Systems Review of Systems  Constitutional: Positive for appetite change.  HENT: Negative for congestion.   Respiratory:  Negative for cough and shortness of breath.   Cardiovascular: Negative for chest pain.  Gastrointestinal: Positive for abdominal pain, nausea and vomiting.  Genitourinary: Negative for frequency.  Musculoskeletal: Negative for back pain.  Skin: Negative for wound.     Physical Exam Updated Vital Signs BP 118/80 (BP Location: Right Arm)   Pulse 86   Temp 98.5 F (36.9 C) (Oral)   Wt (!) 304 lb 3.8 oz (138 kg) Comment: dry wt at dialysis yesterday  SpO2 96%   BMI 49.10 kg/m   Physical Exam  Constitutional: He appears well-developed.  HENT:  Head: Normocephalic.  Cardiovascular: Normal rate.   Pulmonary/Chest: Effort normal.  Abdominal: There is tenderness.  Patient is obese. Moderate upper abdominal tenderness. No hernias palpated.  Musculoskeletal:  Dialysis access  to left upper arm.  Neurological: He is alert.  Skin: Skin is warm.     ED Treatments / Results  Labs (all labs ordered are listed, but only abnormal results are displayed) Labs Reviewed  COMPREHENSIVE METABOLIC PANEL - Abnormal; Notable for the following:       Result Value   Chloride 93 (*)    Glucose, Bld 130 (*)    BUN 35 (*)    Creatinine, Ser 8.94 (*)    Calcium 8.4 (*)    Total Protein 8.4 (*)    GFR calc non Af Amer 7 (*)    GFR calc Af Amer 8 (*)    Anion gap 18 (*)    All other components within normal limits  CBC WITH DIFFERENTIAL/PLATELET - Abnormal; Notable for the following:    WBC 11.2 (*)    Neutro Abs 9.4 (*)    All other components within normal limits  LIPASE, BLOOD    EKG  EKG Interpretation None       Radiology US Abdomen Complete  Result Date: 06/11/2016 CLINICAL DATA:  Onset of upper abdominal pain last night. EXAM: ABDOMEN ULTRASOUND COMPLETE COMPARISON:  Abdominal and pelvic CT scan of Dec 27, 2014 FINDINGS: The study is limited overall due to the patient's body habitus. Gallbladder: There are multiple gallstones present with the largest measuring 6 cm in diameter.  There is no gallbladder wall thickening, pericholecystic fluid, or positive sonographic Murphy's sign. Common bile duct: Diameter: 5 mm Liver: The hepatic echotexture is mildly increased diffusely. There is no focal mass nor ductal dilation. IVC: No abnormality visualized. Pancreas: The pancreas was largely obscured by bowel gas. The visualized portions of the pancreatic head and body are normal. Spleen: Size and appearance within normal limits. Right Kidney: Length: 9.3 cm. The renal cortical echotexture is increased diffusely. There are mid and lower pole cortical cysts measuring up to 1.3 cm in diameter. There is no hydronephrosis. Left Kidney: Length: 9.7 cm. The renal cortical echotexture is diffusely increased similar to that of the right kidney. There is an upper pole cyst measuring 2.9 x 3.4 x 2.7 cm. A second cyst located medially measures 1.6 x 1.8 x 2.1 cm. There is no hydronephrosis. Abdominal aorta: No aneurysm visualized. The bifurcation was obscured by bowel gas. Other findings: No ascites is observed. IMPRESSION: 1. Gallstones without sonographic evidence of acute cholecystitis. Increased hepatic echotexture consistent with fatty infiltration. 2. Bilateral renal cortical atrophy and increased cortical echotexture consistent with medical renal disease. Bilateral simple appearing renal cysts. No hydronephrosis. 3. Bowel gas obscured the aortic bifurcation. The visualized portions of the abdominal aorta are normal in caliber. Electronically Signed   By: David  Martinique M.D.   On: 06/11/2016 13:57   Dg Abd Acute W/chest  Result Date: 06/11/2016 CLINICAL DATA:  Nausea and vomiting beginning 1 night ago. EXAM: DG ABDOMEN ACUTE W/ 1V CHEST COMPARISON:  Chest in two views abdomen 12/31/2014. FINDINGS: Single-view of the chest demonstrates clear lungs. Heart size is upper normal. Lung volumes are somewhat low. No pneumothorax or pleural effusion. Two views of the abdomen show no free intraperitoneal air.  The bowel gas pattern is unremarkable. No abnormal abdominal calcification is seen. No focal bony lesion is identified. All imaged bones demonstrate somewhat increased density suggested renal osteodystrophy in this patient with a history of renal insufficiency. IMPRESSION: No acute abnormality chest or abdomen. Electronically Signed   By: Inge Rise M.D.   On: 06/11/2016 09:01    Procedures  Procedures (including critical care time)  Medications Ordered in ED Medications  ondansetron (ZOFRAN) injection 4 mg (not administered)  sodium chloride 0.9 % bolus 500 mL (0 mLs Intravenous Stopped 06/11/16 0910)  promethazine (PHENERGAN) injection 25 mg (25 mg Intravenous Given 06/11/16 0823)  gi cocktail (Maalox,Lidocaine,Donnatal) (30 mLs Oral Given 06/11/16 1057)  fentaNYL (SUBLIMAZE) injection 50 mcg (50 mcg Intravenous Given 06/11/16 1057)     Initial Impression / Assessment and Plan / ED Course  I have reviewed the triage vital signs and the nursing notes.  Pertinent labs & imaging results that were available during my care of the patient were reviewed by me and considered in my medical decision making (see chart for details).  Clinical Course     Patient with upper abdominal pain. He does have a history of nausea vomiting and abdominal pain. Has been vomiting since last night. Continues to have nausea here even after Zofran and Phenergan labs overall reassuring, however does have gallstones on ultrasound. With continued pain and nausea will admit to internal medicine.  Final Clinical Impressions(s) / ED Diagnoses   Final diagnoses:  Intractable vomiting with nausea, unspecified vomiting type  Epigastric pain  Calculus of gallbladder without cholecystitis without obstruction    New Prescriptions New Prescriptions   No medications on file     Davonna Belling, MD 06/11/16 1415

## 2016-06-11 NOTE — ED Notes (Signed)
Admitting NP at bedside

## 2016-06-11 NOTE — ED Notes (Signed)
Patient transported to Ultrasound 

## 2016-06-12 DIAGNOSIS — Z992 Dependence on renal dialysis: Secondary | ICD-10-CM

## 2016-06-12 DIAGNOSIS — N281 Cyst of kidney, acquired: Secondary | ICD-10-CM | POA: Diagnosis present

## 2016-06-12 DIAGNOSIS — K8020 Calculus of gallbladder without cholecystitis without obstruction: Secondary | ICD-10-CM | POA: Diagnosis present

## 2016-06-12 DIAGNOSIS — E860 Dehydration: Secondary | ICD-10-CM | POA: Diagnosis present

## 2016-06-12 DIAGNOSIS — N186 End stage renal disease: Secondary | ICD-10-CM | POA: Diagnosis present

## 2016-06-12 DIAGNOSIS — M170 Bilateral primary osteoarthritis of knee: Secondary | ICD-10-CM | POA: Diagnosis present

## 2016-06-12 DIAGNOSIS — Z7952 Long term (current) use of systemic steroids: Secondary | ICD-10-CM | POA: Diagnosis not present

## 2016-06-12 DIAGNOSIS — K219 Gastro-esophageal reflux disease without esophagitis: Secondary | ICD-10-CM | POA: Diagnosis present

## 2016-06-12 DIAGNOSIS — N2581 Secondary hyperparathyroidism of renal origin: Secondary | ICD-10-CM | POA: Diagnosis present

## 2016-06-12 DIAGNOSIS — Z9884 Bariatric surgery status: Secondary | ICD-10-CM | POA: Diagnosis not present

## 2016-06-12 DIAGNOSIS — Z79899 Other long term (current) drug therapy: Secondary | ICD-10-CM | POA: Diagnosis not present

## 2016-06-12 DIAGNOSIS — K5900 Constipation, unspecified: Secondary | ICD-10-CM | POA: Diagnosis present

## 2016-06-12 DIAGNOSIS — I9589 Other hypotension: Secondary | ICD-10-CM | POA: Diagnosis present

## 2016-06-12 DIAGNOSIS — Z96651 Presence of right artificial knee joint: Secondary | ICD-10-CM | POA: Diagnosis present

## 2016-06-12 DIAGNOSIS — R1013 Epigastric pain: Secondary | ICD-10-CM | POA: Diagnosis present

## 2016-06-12 DIAGNOSIS — Z6841 Body Mass Index (BMI) 40.0 and over, adult: Secondary | ICD-10-CM | POA: Diagnosis not present

## 2016-06-12 DIAGNOSIS — E8889 Other specified metabolic disorders: Secondary | ICD-10-CM | POA: Diagnosis present

## 2016-06-12 DIAGNOSIS — D631 Anemia in chronic kidney disease: Secondary | ICD-10-CM | POA: Diagnosis present

## 2016-06-12 DIAGNOSIS — Z87891 Personal history of nicotine dependence: Secondary | ICD-10-CM | POA: Diagnosis not present

## 2016-06-12 DIAGNOSIS — R739 Hyperglycemia, unspecified: Secondary | ICD-10-CM | POA: Diagnosis present

## 2016-06-12 DIAGNOSIS — K760 Fatty (change of) liver, not elsewhere classified: Secondary | ICD-10-CM | POA: Diagnosis present

## 2016-06-12 DIAGNOSIS — K2970 Gastritis, unspecified, without bleeding: Secondary | ICD-10-CM | POA: Diagnosis present

## 2016-06-12 LAB — COMPREHENSIVE METABOLIC PANEL
ALK PHOS: 49 U/L (ref 38–126)
ALT: 17 U/L (ref 17–63)
AST: 15 U/L (ref 15–41)
Albumin: 3.8 g/dL (ref 3.5–5.0)
Anion gap: 17 — ABNORMAL HIGH (ref 5–15)
BILIRUBIN TOTAL: 0.8 mg/dL (ref 0.3–1.2)
BUN: 51 mg/dL — AB (ref 6–20)
CALCIUM: 7.8 mg/dL — AB (ref 8.9–10.3)
CHLORIDE: 94 mmol/L — AB (ref 101–111)
CO2: 26 mmol/L (ref 22–32)
CREATININE: 11.5 mg/dL — AB (ref 0.61–1.24)
GFR, EST AFRICAN AMERICAN: 6 mL/min — AB (ref >60)
GFR, EST NON AFRICAN AMERICAN: 5 mL/min — AB (ref >60)
Glucose, Bld: 93 mg/dL (ref 65–99)
Potassium: 3.3 mmol/L — ABNORMAL LOW (ref 3.5–5.1)
Sodium: 137 mmol/L (ref 135–145)
Total Protein: 7.3 g/dL (ref 6.5–8.1)

## 2016-06-12 LAB — CBC
HCT: 37 % — ABNORMAL LOW (ref 39.0–52.0)
Hemoglobin: 12.6 g/dL — ABNORMAL LOW (ref 13.0–17.0)
MCH: 31.2 pg (ref 26.0–34.0)
MCHC: 34.1 g/dL (ref 30.0–36.0)
MCV: 91.6 fL (ref 78.0–100.0)
PLATELETS: 200 10*3/uL (ref 150–400)
RBC: 4.04 MIL/uL — ABNORMAL LOW (ref 4.22–5.81)
RDW: 13.9 % (ref 11.5–15.5)
WBC: 9.8 10*3/uL (ref 4.0–10.5)

## 2016-06-12 LAB — HEMOGLOBIN A1C
Hgb A1c MFr Bld: 5.2 % (ref 4.8–5.6)
MEAN PLASMA GLUCOSE: 103 mg/dL

## 2016-06-12 MED ORDER — METHOCARBAMOL 1000 MG/10ML IJ SOLN
500.0000 mg | Freq: Three times a day (TID) | INTRAVENOUS | Status: DC | PRN
  Administered 2016-06-12: 500 mg via INTRAVENOUS
  Filled 2016-06-12 (×3): qty 5

## 2016-06-12 MED ORDER — ALTEPLASE 2 MG IJ SOLR: 2.0000 mg | Freq: Once | INTRAMUSCULAR | Status: DC | PRN

## 2016-06-12 MED ORDER — PENTAFLUOROPROP-TETRAFLUOROETH EX AERO: 1.0000 "application " | INHALATION_SPRAY | CUTANEOUS | Status: DC | PRN

## 2016-06-12 MED ORDER — LIDOCAINE HCL (PF) 1 % IJ SOLN: 5.0000 mL | INTRAMUSCULAR | Status: DC | PRN

## 2016-06-12 MED ORDER — HEPARIN SODIUM (PORCINE) 1000 UNIT/ML DIALYSIS: 1000.0000 [IU] | INTRAMUSCULAR | Status: DC | PRN

## 2016-06-12 MED ORDER — HEPARIN SODIUM (PORCINE) 1000 UNIT/ML DIALYSIS: 100.0000 [IU]/kg | INTRAMUSCULAR | Status: DC | PRN

## 2016-06-12 MED ORDER — SODIUM CHLORIDE 0.9 % IV SOLN: 100.0000 mL | INTRAVENOUS | Status: DC | PRN

## 2016-06-12 MED ORDER — FAMOTIDINE IN NACL 20-0.9 MG/50ML-% IV SOLN
20.0000 mg | INTRAVENOUS | Status: DC
  Administered 2016-06-13 – 2016-06-14 (×2): 20 mg via INTRAVENOUS
  Filled 2016-06-12 (×3): qty 50

## 2016-06-12 MED ORDER — LIDOCAINE-PRILOCAINE 2.5-2.5 % EX CREA: 1.0000 "application " | TOPICAL_CREAM | CUTANEOUS | Status: DC | PRN

## 2016-06-12 MED ORDER — FENTANYL CITRATE (PF) 100 MCG/2ML IJ SOLN
INTRAMUSCULAR | Status: DC
Start: 2016-06-12 — End: 2016-06-12
  Filled 2016-06-12: qty 2

## 2016-06-12 NOTE — Progress Notes (Signed)
Patient ID: [name], male   DOB: March 25, 1986, 30 y.o.   MRN: [id number]    PROGRESS NOTE    [name]  WUJ:[id number] DOB: 1986/03/16 DOA: 06/11/2016  PCP: Philis Fendt, MD   Brief Narrative:  30 y.o. male with medical history significant for chronic kidney disease on dialysis, morbid obesity with a BMI 47, chronic arthritis of the knees, secondary hyperparathyroidism, history of laparoscopic banding procedure to 80 and weight loss with band subsequently surgically removed. Patient reports that he began having significant abdominal pain with nausea and vomiting after eating a hotdog and vegetables last night. He has been unable to keep any food down.   Assessment & Plan: Abdominal pain with hx of gastric emptying problems, ? Cholelithiasis vs gastritis  - S/p lap Band removal for nausea and vomiting 12/2014 - no indication for surgical intervention at this time - will keep on PPI, change to BID - allow antiemetics as needed   ESRD on HD - per nephrology team, appreciate assistance   Morbid obesity - Body mass index is 48.54 kg/m  Hepatic steatosis - Incidental finding on CT - Current LFTs within normal limits - Likely either familial versus related to persistent obesity  DVT prophylaxis: Heparin SQ Code Status: Full  Family Communication: Patient at bedside  Disposition Plan: Home in 1-2 days   Consultants:   Nephrology  Surgery   Procedures:   None  Antimicrobials:      Subjective: Still with abdominal pain.  Objective: Vitals:   06/12/16 1030 06/12/16 1100 06/12/16 1126 06/12/16 1321  BP: (!) 84/32 (!) 147/63 (!) 150/82 108/62  Pulse: 65 77 71 84  Resp:   19 19  Temp:   97.5 F (36.4 C) 97.6 F (36.4 C)  TempSrc:   Oral Oral  SpO2:   98% 100%  Weight:   (!) 136.4 kg (300 lb 11.3 oz)     Intake/Output Summary (Last 24 hours) at 06/12/16 1634 Last data filed at 06/12/16 1322  Gross per 24 hour  Intake              370 ml  Output              1518 ml  Net            -1148 ml   Filed Weights   06/12/16 0429 06/12/16 0730 06/12/16 1126  Weight: (!) 138.2 kg (304 lb 10.8 oz) (!) 137.9 kg (304 lb 0.2 oz) (!) 136.4 kg (300 lb 11.3 oz)    Examination:  General exam: Appears calm and comfortable  Respiratory system: Clear to auscultation. Respiratory effort normal. Cardiovascular system: S1 & S2 heard, RRR. No JVD, murmurs, rubs, gallops or clicks. No pedal edema. Gastrointestinal system: Abdomen is nondistended, soft and nontender. No organomegaly or masses felt. Normal bowel sounds heard. Central nervous system: Alert and oriented. No focal neurological deficits.   Data Reviewed: I have personally reviewed following labs and imaging studies  CBC:  Recent Labs Lab 06/11/16 0810 06/12/16 0332  WBC 11.2* 9.8  NEUTROABS 9.4*  --   HGB 14.1 12.6*  HCT 41.0 37.0*  MCV 90.9 91.6  PLT 223 956   Basic Metabolic Panel:  Recent Labs Lab 06/11/16 0810 06/12/16 0332  NA 137 137  K 3.8 3.3*  CL 93* 94*  CO2 26 26  GLUCOSE 130* 93  BUN 35* 51*  CREATININE 8.94* 11.50*  CALCIUM 8.4* 7.8*   GFR: Estimated Creatinine Clearance: 12.2 mL/min (by  C-G formula based on SCr of 11.5 mg/dL (H)). Liver Function Tests:  Recent Labs Lab 06/11/16 0810 06/12/16 0332  AST 23 15  ALT 25 17  ALKPHOS 58 49  BILITOT 0.9 0.8  PROT 8.4* 7.3  ALBUMIN 4.3 3.8    Recent Labs Lab 06/11/16 0810  LIPASE 30   HbA1C:  Recent Labs  06/11/16 1453  HGBA1C 5.2   Urine analysis:    Component Value Date/Time   LABSPEC 1.010 01/08/2010 1817   PHURINE >=9.0 01/08/2010 1817   GLUCOSEU 100 (A) 01/08/2010 1817   HGBUR LARGE (A) 01/08/2010 1817   BILIRUBINUR LARGE (A) 01/08/2010 1817   KETONESUR 15 (A) 01/08/2010 1817   PROTEINUR >=300 (A) 01/08/2010 1817   UROBILINOGEN >=8.0 01/08/2010 1817   NITRITE POSITIVE (A) 01/08/2010 1817   LEUKOCYTESUR (A) 01/08/2010 1817    LARGE Biochemical Testing Only. Please order routine  urinalysis from main lab if confirmatory testing is needed.   Radiology Studies: US Abdomen Complete  Result Date: 06/11/2016 CLINICAL DATA:  Onset of upper abdominal pain last night. EXAM: ABDOMEN ULTRASOUND COMPLETE COMPARISON:  Abdominal and pelvic CT scan of Dec 27, 2014 FINDINGS: The study is limited overall due to the patient's body habitus. Gallbladder: There are multiple gallstones present with the largest measuring 6 cm in diameter. There is no gallbladder wall thickening, pericholecystic fluid, or positive sonographic Murphy's sign. Common bile duct: Diameter: 5 mm Liver: The hepatic echotexture is mildly increased diffusely. There is no focal mass nor ductal dilation. IVC: No abnormality visualized. Pancreas: The pancreas was largely obscured by bowel gas. The visualized portions of the pancreatic head and body are normal. Spleen: Size and appearance within normal limits. Right Kidney: Length: 9.3 cm. The renal cortical echotexture is increased diffusely. There are mid and lower pole cortical cysts measuring up to 1.3 cm in diameter. There is no hydronephrosis. Left Kidney: Length: 9.7 cm. The renal cortical echotexture is diffusely increased similar to that of the right kidney. There is an upper pole cyst measuring 2.9 x 3.4 x 2.7 cm. A second cyst located medially measures 1.6 x 1.8 x 2.1 cm. There is no hydronephrosis. Abdominal aorta: No aneurysm visualized. The bifurcation was obscured by bowel gas. Other findings: No ascites is observed. IMPRESSION: 1. Gallstones without sonographic evidence of acute cholecystitis. Increased hepatic echotexture consistent with fatty infiltration. 2. Bilateral renal cortical atrophy and increased cortical echotexture consistent with medical renal disease. Bilateral simple appearing renal cysts. No hydronephrosis. 3. Bowel gas obscured the aortic bifurcation. The visualized portions of the abdominal aorta are normal in caliber. Electronically Signed   By: David   Martinique M.D.   On: 06/11/2016 13:57   Nm Hepato W/eject Fract  Result Date: 06/11/2016 CLINICAL DATA:  30 y/o  M; abdominal pain with nausea and vomiting. EXAM: NUCLEAR MEDICINE HEPATOBILIARY IMAGING WITH GALLBLADDER EF TECHNIQUE: Sequential images of the abdomen were obtained out to 60 minutes following intravenous administration of radiopharmaceutical. After slow intravenous infusion of 99 micrograms Cholecystokinin and ingestion of 8 ounces of Ensure, gallbladder ejection fraction was determined. RADIOPHARMACEUTICALS:  5.2 mCi Tc-84m Choletec IV COMPARISON:  06/11/2016 abdominal ultrasound. Hepatobiliary scintigraphy dated 12/01/2005 FINDINGS: Prompt uptake and biliary excretion of activity by the liver is seen. Gallbladder activity is visualized, consistent with patency of cystic duct. Biliary activity passes into small bowel, consistent with patent common bile duct. Calculated gallbladder ejection fraction is 39%, previously 39%. (At 60 min, normal ejection fraction is greater than 35% after cholecystokinin.) IMPRESSION: 1. Patent  cystic and common bile ducts. 2. 39% gallbladder ejection fraction unchanged from 2007. Lower limits of normal. Electronically Signed   By: Kristine Garbe M.D.   On: 06/11/2016 19:24   Dg Abd Acute W/chest  Result Date: 06/11/2016 CLINICAL DATA:  Nausea and vomiting beginning 1 night ago. EXAM: DG ABDOMEN ACUTE W/ 1V CHEST COMPARISON:  Chest in two views abdomen 12/31/2014. FINDINGS: Single-view of the chest demonstrates clear lungs. Heart size is upper normal. Lung volumes are somewhat low. No pneumothorax or pleural effusion. Two views of the abdomen show no free intraperitoneal air. The bowel gas pattern is unremarkable. No abnormal abdominal calcification is seen. No focal bony lesion is identified. All imaged bones demonstrate somewhat increased density suggested renal osteodystrophy in this patient with a history of renal insufficiency. IMPRESSION: No acute  abnormality chest or abdomen. Electronically Signed   By: Inge Rise M.D.   On: 06/11/2016 09:01      Scheduled Meds: . diclofenac sodium  4 g Topical QID  . famotidine (PEPCID) IV  20 mg Intravenous Q12H  . heparin  5,000 Units Subcutaneous Q8H  . ketorolac  15 mg Intravenous Q6H  . metoCLOPramide (REGLAN) injection  10 mg Intravenous Q6H  . piperacillin-tazobactam (ZOSYN)  IV  3.375 g Intravenous Q12H  . predniSONE  20 mg Oral Daily  . sevelamer carbonate  2.4 g Oral TID WC   Continuous Infusions: . sodium chloride 30 mL (06/11/16 1450)     LOS: 0 days   Time spent: 20 minutes   Faye Ramsay, MD [facility] Pager [phone]  If 7PM-7AM, please contact night-coverage [email or web address] Password [email or web address] 06/12/2016, 4:34 PM

## 2016-06-12 NOTE — Procedures (Signed)
I was present at this dialysis session. I have reviewed the session itself and made appropriate changes.   Filed Weights   06/11/16 1853 06/12/16 0429 06/12/16 0730  Weight: (!) 138.9 kg (306 lb 3.5 oz) (!) 138.2 kg (304 lb 10.8 oz) (!) 137.9 kg (304 lb 0.2 oz)     Recent Labs Lab 06/12/16 0332  NA 137  K 3.3*  CL 94*  CO2 26  GLUCOSE 93  BUN 51*  CREATININE 11.50*  CALCIUM 7.8*     Recent Labs Lab 06/11/16 0810 06/12/16 0332  WBC 11.2* 9.8  NEUTROABS 9.4*  --   HGB 14.1 12.6*  HCT 41.0 37.0*  MCV 90.9 91.6  PLT 223 200    Scheduled Meds: . diclofenac sodium  4 g Topical QID  . famotidine (PEPCID) IV  20 mg Intravenous Q12H  . heparin  5,000 Units Subcutaneous Q8H  . ketorolac  15 mg Intravenous Q6H  . methocarbamol (ROBAXIN)  IV  500 mg Intravenous Q12H  . metoCLOPramide (REGLAN) injection  10 mg Intravenous Q6H  . piperacillin-tazobactam (ZOSYN)  IV  3.375 g Intravenous Q12H  . predniSONE  20 mg Oral Daily  . sevelamer carbonate  2.4 g Oral TID WC   Continuous Infusions: . sodium chloride 30 mL (06/11/16 1450)   PRN Meds:.sodium chloride, sodium chloride, acetaminophen **OR** acetaminophen, albuterol, alteplase, diazepam, fentaNYL (SUBLIMAZE) injection, heparin, heparin, lidocaine (PF), lidocaine-prilocaine, ondansetron (ZOFRAN) IV **OR** promethazine, pentafluoroprop-tetrafluoroeth   Outpt HD Orders Unit: [facility]: MWF Time: 4h 4min Dialyzer: F200 EDW: 134.5kg K/Ca: 2/2.5 Access: LUE AVF BFR/DFR: 450/800 UF Proflie: none VDRA: Calcitriol 0.74mcg TIW EPO: none IV Fe: none Heparin: 12,000 IVB    Assessment and plan 1. Abdominal pain- workup negative for cholecystitis per Surgery 2. ESRD- continue with HD on MWFSat Schedule 3. HTN/Volume-  3.4kg above EDW and BP stable to low. 4. Heme- hgb stable at 12.6 no epo 5. Vascular access- LAVF +T/B, no issues Donetta Potts,  MD 06/12/2016, 9:40 AM

## 2016-06-12 NOTE — Progress Notes (Signed)
Subjective: Still has abdominal pain but going into hx this isn't new.  The current pain is more mid abdomen prior pain has been more in the upper chest.  He is tender both, mid and RUQ portions of the abdomen.  Some nausea,.    Objective: Vital signs in last 24 hours: Temp:  [97.5 F (36.4 C)-98.7 F (37.1 C)] 97.5 F (36.4 C) (11/10 1126) Pulse Rate:  [65-96] 71 (11/10 1126) Resp:  [17-19] 19 (11/10 1126) BP: (72-150)/(32-82) 150/82 (11/10 1126) SpO2:  [96 %-100 %] 98 % (11/10 1126) Weight:  [136.4 kg (300 lb 11.3 oz)-138.9 kg (306 lb 3.5 oz)] 136.4 kg (300 lb 11.3 oz) (11/10 1126)   npo 1518 of on HD this AM Afebrile, VVS, BP up end of HD LFT's OK , WBC is normal  Negative HIDA EF 39% [place]:  Gallstones without sonographic evidence of acute cholecystitis. Increased hepatic echotexture consistent with fatty infiltration. 2 view film yesterday shows no free air and unremarkable gas pattern Intake/Output from previous day: 11/09 0701 - 11/10 0700 In: 370 [I.V.:120; IV Piggyback:250] Out: -  Intake/Output this shift: Total I/O In: 0  Out: 1518 [Other:1518]  General appearance: alert, cooperative, mild distress and still having pain GI: soft, complains of pain mid uppper and RUQ as most severe.  He appears to be uncomfortable both sides of the upper abdomen.    last BM 2 days ago.  Lab Results:   Recent Labs  06/11/16 0810 06/12/16 0332  WBC 11.2* 9.8  HGB 14.1 12.6*  HCT 41.0 37.0*  PLT 223 200    BMET  Recent Labs  06/11/16 0810 06/12/16 0332  NA 137 137  K 3.8 3.3*  CL 93* 94*  CO2 26 26  GLUCOSE 130* 93  BUN 35* 51*  CREATININE 8.94* 11.50*  CALCIUM 8.4* 7.8*   PT/INR No results for input(s): LABPROT, INR in the last 72 hours.   Recent Labs Lab 06/11/16 0810 06/12/16 0332  AST 23 15  ALT 25 17  ALKPHOS 58 49  BILITOT 0.9 0.8  PROT 8.4* 7.3  ALBUMIN 4.3 3.8     Lipase     Component Value Date/Time   LIPASE 30 06/11/2016 0810      Studies/Results: US Abdomen Complete  Result Date: 06/11/2016 CLINICAL DATA:  Onset of upper abdominal pain last night. EXAM: ABDOMEN ULTRASOUND COMPLETE COMPARISON:  Abdominal and pelvic CT scan of Dec 27, 2014 FINDINGS: The study is limited overall due to the patient's body habitus. Gallbladder: There are multiple gallstones present with the largest measuring 6 cm in diameter. There is no gallbladder wall thickening, pericholecystic fluid, or positive sonographic Murphy's sign. Common bile duct: Diameter: 5 mm Liver: The hepatic echotexture is mildly increased diffusely. There is no focal mass nor ductal dilation. IVC: No abnormality visualized. Pancreas: The pancreas was largely obscured by bowel gas. The visualized portions of the pancreatic head and body are normal. Spleen: Size and appearance within normal limits. Right Kidney: Length: 9.3 cm. The renal cortical echotexture is increased diffusely. There are mid and lower pole cortical cysts measuring up to 1.3 cm in diameter. There is no hydronephrosis. Left Kidney: Length: 9.7 cm. The renal cortical echotexture is diffusely increased similar to that of the right kidney. There is an upper pole cyst measuring 2.9 x 3.4 x 2.7 cm. A second cyst located medially measures 1.6 x 1.8 x 2.1 cm. There is no hydronephrosis. Abdominal aorta: No aneurysm visualized. The bifurcation was obscured by bowel  gas. Other findings: No ascites is observed. IMPRESSION: 1. Gallstones without sonographic evidence of acute cholecystitis. Increased hepatic echotexture consistent with fatty infiltration. 2. Bilateral renal cortical atrophy and increased cortical echotexture consistent with medical renal disease. Bilateral simple appearing renal cysts. No hydronephrosis. 3. Bowel gas obscured the aortic bifurcation. The visualized portions of the abdominal aorta are normal in caliber. Electronically Signed   By: David  Martinique M.D.   On: 06/11/2016 13:57   Nm Hepato W/eject  Fract  Result Date: 06/11/2016 CLINICAL DATA:  30 y/o  M; abdominal pain with nausea and vomiting. EXAM: NUCLEAR MEDICINE HEPATOBILIARY IMAGING WITH GALLBLADDER EF TECHNIQUE: Sequential images of the abdomen were obtained out to 60 minutes following intravenous administration of radiopharmaceutical. After slow intravenous infusion of 99 micrograms Cholecystokinin and ingestion of 8 ounces of Ensure, gallbladder ejection fraction was determined. RADIOPHARMACEUTICALS:  5.2 mCi Tc-23m Choletec IV COMPARISON:  06/11/2016 abdominal ultrasound. Hepatobiliary scintigraphy dated 12/01/2005 FINDINGS: Prompt uptake and biliary excretion of activity by the liver is seen. Gallbladder activity is visualized, consistent with patency of cystic duct. Biliary activity passes into small bowel, consistent with patent common bile duct. Calculated gallbladder ejection fraction is 39%, previously 39%. (At 60 min, normal ejection fraction is greater than 35% after cholecystokinin.) IMPRESSION: 1. Patent cystic and common bile ducts. 2. 39% gallbladder ejection fraction unchanged from 2007. Lower limits of normal. Electronically Signed   By: Kristine Garbe M.D.   On: 06/11/2016 19:24   Dg Abd Acute W/chest  Result Date: 06/11/2016 CLINICAL DATA:  Nausea and vomiting beginning 1 night ago. EXAM: DG ABDOMEN ACUTE W/ 1V CHEST COMPARISON:  Chest in two views abdomen 12/31/2014. FINDINGS: Single-view of the chest demonstrates clear lungs. Heart size is upper normal. Lung volumes are somewhat low. No pneumothorax or pleural effusion. Two views of the abdomen show no free intraperitoneal air. The bowel gas pattern is unremarkable. No abnormal abdominal calcification is seen. No focal bony lesion is identified. All imaged bones demonstrate somewhat increased density suggested renal osteodystrophy in this patient with a history of renal insufficiency. IMPRESSION: No acute abnormality chest or abdomen. Electronically Signed   By:  Inge Rise M.D.   On: 06/11/2016 09:01   Prior to Admission medications   Medication Sig Start Date End Date Taking? Authorizing Provider  albuterol (PROVENTIL HFA;VENTOLIN HFA) 108 (90 Base) MCG/ACT inhaler Inhale 2 puffs into the lungs every 4 (four) hours as needed for wheezing or shortness of breath. 05/14/16  Yes Lysbeth Penner, [name]  calcium carbonate (TUMS - DOSED IN MG ELEMENTAL CALCIUM) 500 MG chewable tablet Chew 2 tablets by mouth 2 (two) times daily as needed for indigestion or heartburn.    Yes Historical Provider, MD  diazepam (VALIUM) 10 MG tablet Take 1 tablet (10 mg total) by mouth every 6 (six) hours as needed for anxiety. 10/01/15  Yes Charlett Blake, MD  docusate sodium (COLACE) 100 MG capsule Take 1 capsule (100 mg total) by mouth every 12 (twelve) hours. 02/19/16  Yes Benjamin Cartner, [name]  glycopyrrolate (ROBINUL) 2 MG tablet Take 1 tablet (2 mg total) by mouth 2 (two) times daily. 01/25/15  Yes Inda Castle, MD  Hyoscyamine Sulfate 0.375 MG TBCR Take 1 tablet twice daily 01/22/15  Yes Inda Castle, MD  lidocaine (XYLOCAINE) 2 % jelly Apply 1 application topically as needed. 02/19/16  Yes Benjamin Cartner, [name]  methocarbamol (ROBAXIN) 500 MG tablet Take 1 tablet (500 mg total) by mouth 2 (two) times daily. 02/04/16  Yes April Palumbo, MD  multivitamin (RENA-VIT) TABS tablet Take 1 tablet by mouth daily.   Yes Historical Provider, MD  predniSONE (DELTASONE) 10 MG tablet Take 2 tablets (20 mg total) by mouth daily. 05/14/16  Yes Lysbeth Penner, [name]  sevelamer carbonate (RENVELA) 2.4 G PACK Take 2.4 g by mouth 3 (three) times daily with meals.    Yes Historical Provider, MD  traMADol (ULTRAM) 50 MG tablet TAKE 1 TABLET BY MOUTH EVERY 8 HOURS AS NEEDED 04/20/16  Yes Charlett Blake, MD  VOLTAREN 1 % GEL Apply 4 g topically 4 (four) times daily. 02/04/16  Yes April Palumbo, MD  azithromycin (ZITHROMAX) 250 MG tablet Take 1 tablet (250 mg total) by mouth daily. Take  first 2 tablets together, then 1 every day until finished. Patient not taking: Reported on 06/11/2016 05/14/16   Lysbeth Penner, [name]  benzonatate (TESSALON) 100 MG capsule Take 2 capsules (200 mg total) by mouth 3 (three) times daily as needed for cough. Patient not taking: Reported on 06/11/2016 05/14/16   Lysbeth Penner, [name]    Medications: . diclofenac sodium  4 g Topical QID  . famotidine (PEPCID) IV  20 mg Intravenous Q12H  . heparin  5,000 Units Subcutaneous Q8H  . ketorolac  15 mg Intravenous Q6H  . metoCLOPramide (REGLAN) injection  10 mg Intravenous Q6H  . piperacillin-tazobactam (ZOSYN)  IV  3.375 g Intravenous Q12H  . predniSONE  20 mg Oral Daily  . sevelamer carbonate  2.4 g Oral TID WC    Assessment/Plan Abdominal pain with hx of gastric emptying issues Hx of chronic abdominal pain and nausea S/p lap Band removal for nausea and vomiting 12/2014 Cholelithiasis, possible cholecystitis GERD ESRD - HD  MWF Hypertension Hyperparathyroidism Body mass index is 48.54 kg/m.  Plan:  I'm not sure why he's having pain.  Labs OK, he has this history of pain.  Possible gastritis.  Will discuss with DR Rosendo Gros.      LOS: 0 days    Chalonda Schlatter 06/12/2016 [phone]

## 2016-06-13 DIAGNOSIS — R739 Hyperglycemia, unspecified: Secondary | ICD-10-CM

## 2016-06-13 LAB — RENAL FUNCTION PANEL
Albumin: 4.1 g/dL (ref 3.5–5.0)
Anion gap: 21 — ABNORMAL HIGH (ref 5–15)
BUN: 34 mg/dL — ABNORMAL HIGH (ref 6–20)
CO2: 20 mmol/L — ABNORMAL LOW (ref 22–32)
Calcium: 8.3 mg/dL — ABNORMAL LOW (ref 8.9–10.3)
Chloride: 95 mmol/L — ABNORMAL LOW (ref 101–111)
Creatinine, Ser: 9.31 mg/dL — ABNORMAL HIGH (ref 0.61–1.24)
GFR calc Af Amer: 8 mL/min — ABNORMAL LOW
GFR calc non Af Amer: 7 mL/min — ABNORMAL LOW
Glucose, Bld: 93 mg/dL (ref 65–99)
Phosphorus: 6.9 mg/dL — ABNORMAL HIGH (ref 2.5–4.6)
Potassium: 4.2 mmol/L (ref 3.5–5.1)
Sodium: 136 mmol/L (ref 135–145)

## 2016-06-13 LAB — CBC
HCT: 38.7 % — ABNORMAL LOW (ref 39.0–52.0)
Hemoglobin: 13.1 g/dL (ref 13.0–17.0)
MCH: 30.9 pg (ref 26.0–34.0)
MCHC: 33.9 g/dL (ref 30.0–36.0)
MCV: 91.3 fL (ref 78.0–100.0)
Platelets: 217 K/uL (ref 150–400)
RBC: 4.24 MIL/uL (ref 4.22–5.81)
RDW: 13.6 % (ref 11.5–15.5)
WBC: 11.9 K/uL — ABNORMAL HIGH (ref 4.0–10.5)

## 2016-06-13 LAB — HEPATITIS B SURFACE ANTIGEN: Hepatitis B Surface Ag: NEGATIVE

## 2016-06-13 NOTE — Progress Notes (Signed)
Subjective: Complains of diffuse upper abd pain with nausea  Objective: Vital signs in last 24 hours: Temp:  [97.5 F (36.4 C)-99.7 F (37.6 C)] 98.9 F (37.2 C) (11/11 0454) Pulse Rate:  [71-85] 81 (11/11 0454) Resp:  [17-19] 17 (11/11 0454) BP: (95-150)/(51-82) 95/51 (11/11 0454) SpO2:  [98 %-100 %] 98 % (11/11 0454) Weight:  [136.4 kg (300 lb 11.3 oz)] 136.4 kg (300 lb 11.3 oz) (11/10 1126) Last BM Date: 06/12/16  Intake/Output from previous day: 11/10 0701 - 11/11 0700 In: 0  Out: 1518  Intake/Output this shift: No intake/output data recorded.  Resp: clear to auscultation bilaterally Cardio: regular rate and rhythm GI: soft, tender over upper abdomen  Lab Results:   Recent Labs  06/12/16 0332 06/13/16 0431  WBC 9.8 11.9*  HGB 12.6* 13.1  HCT 37.0* 38.7*  PLT 200 217   BMET  Recent Labs  06/12/16 0332 06/13/16 0431  NA 137 136  K 3.3* 4.2  CL 94* 95*  CO2 26 20*  GLUCOSE 93 93  BUN 51* 34*  CREATININE 11.50* 9.31*  CALCIUM 7.8* 8.3*   PT/INR No results for input(s): LABPROT, INR in the last 72 hours. ABG No results for input(s): PHART, HCO3 in the last 72 hours.  Invalid input(s): PCO2, PO2  Studies/Results: US Abdomen Complete  Result Date: 06/11/2016 CLINICAL DATA:  Onset of upper abdominal pain last night. EXAM: ABDOMEN ULTRASOUND COMPLETE COMPARISON:  Abdominal and pelvic CT scan of Dec 27, 2014 FINDINGS: The study is limited overall due to the patient's body habitus. Gallbladder: There are multiple gallstones present with the largest measuring 6 cm in diameter. There is no gallbladder wall thickening, pericholecystic fluid, or positive sonographic Murphy's sign. Common bile duct: Diameter: 5 mm Liver: The hepatic echotexture is mildly increased diffusely. There is no focal mass nor ductal dilation. IVC: No abnormality visualized. Pancreas: The pancreas was largely obscured by bowel gas. The visualized portions of the pancreatic head and body  are normal. Spleen: Size and appearance within normal limits. Right Kidney: Length: 9.3 cm. The renal cortical echotexture is increased diffusely. There are mid and lower pole cortical cysts measuring up to 1.3 cm in diameter. There is no hydronephrosis. Left Kidney: Length: 9.7 cm. The renal cortical echotexture is diffusely increased similar to that of the right kidney. There is an upper pole cyst measuring 2.9 x 3.4 x 2.7 cm. A second cyst located medially measures 1.6 x 1.8 x 2.1 cm. There is no hydronephrosis. Abdominal aorta: No aneurysm visualized. The bifurcation was obscured by bowel gas. Other findings: No ascites is observed. IMPRESSION: 1. Gallstones without sonographic evidence of acute cholecystitis. Increased hepatic echotexture consistent with fatty infiltration. 2. Bilateral renal cortical atrophy and increased cortical echotexture consistent with medical renal disease. Bilateral simple appearing renal cysts. No hydronephrosis. 3. Bowel gas obscured the aortic bifurcation. The visualized portions of the abdominal aorta are normal in caliber. Electronically Signed   By: David  Martinique M.D.   On: 06/11/2016 13:57   Nm Hepato W/eject Fract  Result Date: 06/11/2016 CLINICAL DATA:  30 y/o  M; abdominal pain with nausea and vomiting. EXAM: NUCLEAR MEDICINE HEPATOBILIARY IMAGING WITH GALLBLADDER EF TECHNIQUE: Sequential images of the abdomen were obtained out to 60 minutes following intravenous administration of radiopharmaceutical. After slow intravenous infusion of 99 micrograms Cholecystokinin and ingestion of 8 ounces of Ensure, gallbladder ejection fraction was determined. RADIOPHARMACEUTICALS:  5.2 mCi Tc-87m Choletec IV COMPARISON:  06/11/2016 abdominal ultrasound. Hepatobiliary scintigraphy dated 12/01/2005 FINDINGS: Prompt  uptake and biliary excretion of activity by the liver is seen. Gallbladder activity is visualized, consistent with patency of cystic duct. Biliary activity passes into small  bowel, consistent with patent common bile duct. Calculated gallbladder ejection fraction is 39%, previously 39%. (At 60 min, normal ejection fraction is greater than 35% after cholecystokinin.) IMPRESSION: 1. Patent cystic and common bile ducts. 2. 39% gallbladder ejection fraction unchanged from 2007. Lower limits of normal. Electronically Signed   By: Kristine Garbe M.D.   On: 06/11/2016 19:24    Anti-infectives: Anti-infectives    Start     Dose/Rate Route Frequency Ordered Stop   06/11/16 1800  piperacillin-tazobactam (ZOSYN) IVPB 3.375 g     3.375 g 12.5 mL/hr over 240 Minutes Intravenous Every 12 hours 06/11/16 1655     06/11/16 1500  ciprofloxacin (CIPRO) IVPB 400 mg  Status:  Discontinued     400 mg 200 mL/hr over 60 Minutes Intravenous Every 24 hours 06/11/16 1436 06/11/16 1614      Assessment/Plan: s/p * No surgery found * Pt has known gallstones. Pain seems inconsistent with just symptomatic gallstones Continue zosyn Will follow  LOS: 1 day    TOTH III,Foch Rosenwald S 06/13/2016

## 2016-06-13 NOTE — Consult Note (Signed)
[facility] Renal Consultation Note    Indication for Consultation:  Management of ESRD/hemodialysis, anemia, hypertension/volume, and secondary hyperparathyroidism. PCP:  HPI: [name] is a 30 y.o. male with ESRD, chronic hypotension, obesity, GERD, Hx chronic pain who was admitted under observation on 11/9 for abdominal pain, now converted to full admission. He was dialyzed on 11/10, but HD was cut short due to severe abdominal pain.  There was initial concern for cholecystitis on admit. Abdominal ultrasound showed gallstones without acute infection. HIDA scan normal. Surgery team has been following without plans for surgery, now felt that he has gastritis and started on PPI BID. Denies prior gastric ulceration. Today, he is still having abdominal "cramping" in epigastric area and chest. Denies fever, chills, dyspnea.  Past Medical History:  Diagnosis Date  . Arthritis    "all over" (06/11/2016)  . Chronic lower back pain   . Complication of anesthesia    A little while to wake up after knee surgery in 2008  . Dizziness    when coming off of dialysis  . ESRD (end stage renal disease) on dialysis [facility])    MWF and goes to [facility] (06/11/2016)  . Family history of anesthesia complication    mom slow to wake up  . GERD (gastroesophageal reflux disease)    takes Omeprazole as needed  . Headache(784.0)    occasionally  . History of hiatal hernia   . Hyperparathyroidism ([place])   . Hypertension   . Joint pain   . Joint swelling   . Morbid obesity ([place])   . PONV (postoperative nausea and vomiting)   . Renal insufficiency    Past Surgical History:  Procedure Laterality Date  . AV FISTULA PLACEMENT Bilateral 2005,2007   Uses Right arm  . BREATH TEK H PYLORI N/A 05/15/2014   Procedure: BREATH TEK H PYLORI;  Surgeon: Alphonsa Overall, MD;  Location: [facility];  Service: General;  Laterality: N/A;  . ESOPHAGOGASTRODUODENOSCOPY N/A 12/07/2014   Procedure:  ESOPHAGOGASTRODUODENOSCOPY (EGD);  Surgeon: Alphonsa Overall, MD;  Location: [facility] ENDOSCOPY;  Service: General;  Laterality: N/A;  . ESOPHAGOGASTRODUODENOSCOPY N/A 12/17/2014   Procedure: ESOPHAGOGASTRODUODENOSCOPY (EGD);  Surgeon: Alphonsa Overall, MD;  Location: [place];  Service: General;  Laterality: N/A;  . ESOPHAGOGASTRODUODENOSCOPY ENDOSCOPY    . HERNIA REPAIR  11/2014   w/lap band OR  . JOINT REPLACEMENT    . KNEE ARTHROSCOPY Left 2007  . LAPAROSCOPIC GASTRIC BANDING N/A 11/12/2014   Procedure: LAPAROSCOPIC GASTRIC BANDING;  Surgeon: Alphonsa Overall, MD;  Location: [facility];  Service: General;  Laterality: N/A;  . PARATHYROIDECTOMY N/A 03/30/2013   Procedure: TOTAL PARATHYROIDECTOMY WITH AUTOTRANSPLANT;  Surgeon: Earnstine Regal, MD;  Location: [place];  Service: General;  Laterality: N/A;  Autotransplant to left lower arm.  Marland Kitchen TOTAL KNEE ARTHROPLASTY Right 03/27/2014   Procedure: UNICOMPARTMENTAL ARTHROPLASTY;  Surgeon: Meredith Pel, MD;  Location: [place];  Service: Orthopedics;  Laterality: Right;   Family History  Problem Relation Age of Onset  . Hypertension Mother   . Diabetes Father   . Kidney Stones Sister   . Diabetes Maternal Grandmother   . Liver cancer Maternal Uncle    Social History:  reports that he quit smoking about 17 months ago. His smoking use included Cigarettes. He has a 2.60 pack-year smoking history. He has never used smokeless tobacco. He reports that he does not drink alcohol or use drugs.  ROS: As per HPI otherwise negative.  Physical Exam: Vitals:   06/12/16  1126 06/12/16 1321 06/12/16 2123 06/13/16 0454  BP: (!) 150/82 108/62 135/68 (!) 95/51  Pulse: 71 84 85 81  Resp: 19 19 17 17   Temp: 97.5 F (36.4 C) 97.6 F (36.4 C) 99.7 F (37.6 C) 98.9 F (37.2 C)  TempSrc: Oral Oral Oral Oral  SpO2: 98% 100% 100% 98%  Weight: (!) 136.4 kg (300 lb 11.3 oz)        General: Well developed, well nourished, in no acute distress. Head: Normocephalic, atraumatic, sclera  non-icteric, mucus membranes are moist. Neck: Supple without lymphadenopathy/masses. JVD not elevated. Lungs: Clear bilaterally to auscultation without wheezes, rales, or rhonchi. Breathing is unlabored. Heart: RRR with normal S1, S2. No murmurs, rubs, or gallops appreciated. Abdomen: Soft, mild generalized tenderness across entire upper abdomen. Musculoskeletal:  Strength and tone appear normal for age. Lower extremities: No edema or ischemic changes, no open wounds. Neuro: Alert and oriented X 3. Moves all extremities spontaneously. Psych:  Responds to questions appropriately with a normal affect. Dialysis Access: LUE AVF + thrill  No Known Allergies Prior to Admission medications   Medication Sig Start Date End Date Taking? Authorizing Provider  albuterol (PROVENTIL HFA;VENTOLIN HFA) 108 (90 Base) MCG/ACT inhaler Inhale 2 puffs into the lungs every 4 (four) hours as needed for wheezing or shortness of breath. 05/14/16  Yes Lysbeth Penner, [name]  calcium carbonate (TUMS - DOSED IN MG ELEMENTAL CALCIUM) 500 MG chewable tablet Chew 2 tablets by mouth 2 (two) times daily as needed for indigestion or heartburn.    Yes Historical Provider, MD  diazepam (VALIUM) 10 MG tablet Take 1 tablet (10 mg total) by mouth every 6 (six) hours as needed for anxiety. 10/01/15  Yes Charlett Blake, MD  docusate sodium (COLACE) 100 MG capsule Take 1 capsule (100 mg total) by mouth every 12 (twelve) hours. 02/19/16  Yes Benjamin Cartner, [name]  glycopyrrolate (ROBINUL) 2 MG tablet Take 1 tablet (2 mg total) by mouth 2 (two) times daily. 01/25/15  Yes Inda Castle, MD  Hyoscyamine Sulfate 0.375 MG TBCR Take 1 tablet twice daily 01/22/15  Yes Inda Castle, MD  lidocaine (XYLOCAINE) 2 % jelly Apply 1 application topically as needed. 02/19/16  Yes Benjamin Cartner, [name]  methocarbamol (ROBAXIN) 500 MG tablet Take 1 tablet (500 mg total) by mouth 2 (two) times daily. 02/04/16  Yes April Palumbo, MD  multivitamin  (RENA-VIT) TABS tablet Take 1 tablet by mouth daily.   Yes Historical Provider, MD  predniSONE (DELTASONE) 10 MG tablet Take 2 tablets (20 mg total) by mouth daily. 05/14/16  Yes Lysbeth Penner, [name]  sevelamer carbonate (RENVELA) 2.4 G PACK Take 2.4 g by mouth 3 (three) times daily with meals.    Yes Historical Provider, MD  traMADol (ULTRAM) 50 MG tablet TAKE 1 TABLET BY MOUTH EVERY 8 HOURS AS NEEDED 04/20/16  Yes Charlett Blake, MD  VOLTAREN 1 % GEL Apply 4 g topically 4 (four) times daily. 02/04/16  Yes April Palumbo, MD  azithromycin (ZITHROMAX) 250 MG tablet Take 1 tablet (250 mg total) by mouth daily. Take first 2 tablets together, then 1 every day until finished. Patient not taking: Reported on 06/11/2016 05/14/16   Lysbeth Penner, [name]  benzonatate (TESSALON) 100 MG capsule Take 2 capsules (200 mg total) by mouth 3 (three) times daily as needed for cough. Patient not taking: Reported on 06/11/2016 05/14/16   Lysbeth Penner, [name]   Current Facility-Administered Medications  Medication Dose Route Frequency Provider Last  Rate Last Dose  . 0.9 %  sodium chloride infusion   Intravenous Continuous Samella Parr, NP 30 mL/hr at 06/13/16 0528    . acetaminophen (TYLENOL) tablet 650 mg  650 mg Oral Q6H PRN Samella Parr, NP       Or  . acetaminophen (TYLENOL) suppository 650 mg  650 mg Rectal Q6H PRN Samella Parr, NP      . albuterol (PROVENTIL) (2.5 MG/3ML) 0.083% nebulizer solution 2.5 mg  2.5 mg Inhalation Q4H PRN Samella Parr, NP      . diazepam (VALIUM) tablet 10 mg  10 mg Oral Q6H PRN Samella Parr, NP      . diclofenac sodium (VOLTAREN) 1 % transdermal gel 4 g  4 g Topical QID Samella Parr, NP   4 g at 06/13/16 0012  . famotidine (PEPCID) IVPB 20 mg premix  20 mg Intravenous Q24H Theodis Blaze, MD      . fentaNYL (SUBLIMAZE) injection 50 mcg  50 mcg Intravenous Q8H PRN Elwin Mocha, MD   50 mcg at 06/12/16 2136  . heparin injection 5,000 Units  5,000 Units Subcutaneous  Q8H Samella Parr, NP   5,000 Units at 06/13/16 0530  . ketorolac (TORADOL) 15 MG/ML injection 15 mg  15 mg Intravenous Q6H Samella Parr, NP   15 mg at 06/13/16 0531  . methocarbamol (ROBAXIN) 500 mg in dextrose 5 % 50 mL IVPB  500 mg Intravenous Q8H PRN Theodis Blaze, MD   500 mg at 06/12/16 1752  . metoCLOPramide (REGLAN) injection 10 mg  10 mg Intravenous Q6H Donnie Mesa, MD   10 mg at 06/13/16 0531  . ondansetron (ZOFRAN) injection 4 mg  4 mg Intravenous Q6H PRN Samella Parr, NP   4 mg at 06/12/16 2136   Or  . promethazine (PHENERGAN) injection 25 mg  25 mg Intravenous Q6H PRN Samella Parr, NP   25 mg at 06/11/16 1840  . piperacillin-tazobactam (ZOSYN) IVPB 3.375 g  3.375 g Intravenous Q12H Tyrone Apple, RPH   3.375 g at 06/13/16 0531  . predniSONE (DELTASONE) tablet 20 mg  20 mg Oral Daily Samella Parr, NP   20 mg at 06/12/16 1234  . sevelamer carbonate (RENVELA) powder PACK 2.4 g  2.4 g Oral TID WC Samella Parr, NP   2.4 g at 06/13/16 0900   Labs: Basic Metabolic Panel:  Recent Labs Lab 06/11/16 0810 06/12/16 0332 06/13/16 0431  NA 137 137 136  K 3.8 3.3* 4.2  CL 93* 94* 95*  CO2 26 26 20*  GLUCOSE 130* 93 93  BUN 35* 51* 34*  CREATININE 8.94* 11.50* 9.31*  CALCIUM 8.4* 7.8* 8.3*  PHOS  --   --  6.9*   Liver Function Tests:  Recent Labs Lab 06/11/16 0810 06/12/16 0332 06/13/16 0431  AST 23 15  --   ALT 25 17  --   ALKPHOS 58 49  --   BILITOT 0.9 0.8  --   PROT 8.4* 7.3  --   ALBUMIN 4.3 3.8 4.1    Recent Labs Lab 06/11/16 0810  LIPASE 30   CBC:  Recent Labs Lab 06/11/16 0810 06/12/16 0332 06/13/16 0431  WBC 11.2* 9.8 11.9*  NEUTROABS 9.4*  --   --   HGB 14.1 12.6* 13.1  HCT 41.0 37.0* 38.7*  MCV 90.9 91.6 91.3  PLT 223 200 217   Studies/Results: US Abdomen Complete  Result Date: 06/11/2016  CLINICAL DATA:  Onset of upper abdominal pain last night. EXAM: ABDOMEN ULTRASOUND COMPLETE COMPARISON:  Abdominal and pelvic CT scan of Dec 27, 2014 FINDINGS: The study is limited overall due to the patient's body habitus. Gallbladder: There are multiple gallstones present with the largest measuring 6 cm in diameter. There is no gallbladder wall thickening, pericholecystic fluid, or positive sonographic Murphy's sign. Common bile duct: Diameter: 5 mm Liver: The hepatic echotexture is mildly increased diffusely. There is no focal mass nor ductal dilation. IVC: No abnormality visualized. Pancreas: The pancreas was largely obscured by bowel gas. The visualized portions of the pancreatic head and body are normal. Spleen: Size and appearance within normal limits. Right Kidney: Length: 9.3 cm. The renal cortical echotexture is increased diffusely. There are mid and lower pole cortical cysts measuring up to 1.3 cm in diameter. There is no hydronephrosis. Left Kidney: Length: 9.7 cm. The renal cortical echotexture is diffusely increased similar to that of the right kidney. There is an upper pole cyst measuring 2.9 x 3.4 x 2.7 cm. A second cyst located medially measures 1.6 x 1.8 x 2.1 cm. There is no hydronephrosis. Abdominal aorta: No aneurysm visualized. The bifurcation was obscured by bowel gas. Other findings: No ascites is observed. IMPRESSION: 1. Gallstones without sonographic evidence of acute cholecystitis. Increased hepatic echotexture consistent with fatty infiltration. 2. Bilateral renal cortical atrophy and increased cortical echotexture consistent with medical renal disease. Bilateral simple appearing renal cysts. No hydronephrosis. 3. Bowel gas obscured the aortic bifurcation. The visualized portions of the abdominal aorta are normal in caliber. Electronically Signed   By: David  Martinique M.D.   On: 06/11/2016 13:57   Nm Hepato W/eject Fract  Result Date: 06/11/2016 CLINICAL DATA:  29 y/o  M; abdominal pain with nausea and vomiting. EXAM: NUCLEAR MEDICINE HEPATOBILIARY IMAGING WITH GALLBLADDER EF TECHNIQUE: Sequential images of the abdomen were  obtained out to 60 minutes following intravenous administration of radiopharmaceutical. After slow intravenous infusion of 99 micrograms Cholecystokinin and ingestion of 8 ounces of Ensure, gallbladder ejection fraction was determined. RADIOPHARMACEUTICALS:  5.2 mCi Tc-36m Choletec IV COMPARISON:  06/11/2016 abdominal ultrasound. Hepatobiliary scintigraphy dated 12/01/2005 FINDINGS: Prompt uptake and biliary excretion of activity by the liver is seen. Gallbladder activity is visualized, consistent with patency of cystic duct. Biliary activity passes into small bowel, consistent with patent common bile duct. Calculated gallbladder ejection fraction is 39%, previously 39%. (At 60 min, normal ejection fraction is greater than 35% after cholecystokinin.) IMPRESSION: 1. Patent cystic and common bile ducts. 2. 39% gallbladder ejection fraction unchanged from 2007. Lower limits of normal. Electronically Signed   By: Kristine Garbe M.D.   On: 06/11/2016 19:24    Dialysis Orders:  MWFSat schedule at [facility] 4:45 hours, EDW 134.5kg, 2K/2.5Ca, AVF, BFR 450/DFR 800 - Calcitriol 0.63mcg PO q HD - No ESA or IV iron - Heparin 12,000 units bolus  Assessment/Plan: 1.  Abdominal pain/ ?gastritis: On PPI BID, per primary team. 2.  ESRD: Continue HD per usual MWFSat schedule. Next HD today (11/11). 3.  Hypotension/volume: Chronic hypotension, does not tolerate midodrine. 2kg over EDW by weights, will try to reach goal today as tolerated. 4.  Anemia: Hgb 13.1. No ESA for now. 5.  Metabolic bone disease: Ca ok, Phos slightly high. Continue Renvela pwdr as binder for now.    Veneta Penton, [name] 06/13/2016, 10:48 AM  [facility] Pager: [phone]  I have seen and examined this patient and agree with plan and assessment in the  above note with renal recommendations/intervention highlighted. Pt with diffuse tenderness throughout abdominal exam. Reports last EGD was a year ago at the time of his  lap band which has been removed.  Appreciate CCS assistance and may benefit from GI eval for endoscopy and colonoscopy.  Plan for HD again today.  Also a consideration would be ischemic colitis due to his chronic hypotension. Consider CT angio or just CT scan of abdomen. Broadus John A Cherice Glennie,MD 06/13/2016 12:16 PM

## 2016-06-13 NOTE — Progress Notes (Signed)
Patient ID: [name], male   DOB: Dec 14, 1985, 30 y.o.   MRN: [id number]    PROGRESS NOTE    ADAMS HINCH  SWN:[id number] DOB: 21-Jun-1986 DOA: 06/11/2016  PCP: Philis Fendt, MD   Brief Narrative:  30 y.o. male with medical history significant for chronic kidney disease on dialysis, morbid obesity with a BMI 47, chronic arthritis of the knees, secondary hyperparathyroidism, history of laparoscopic banding procedure to 80 and weight loss with band subsequently surgically removed. Patient reports that he began having significant abdominal pain with nausea and vomiting after eating a hotdog and vegetables last night. He has been unable to keep any food down.   Assessment & Plan: Abdominal pain with hx of gastric emptying problems, ? Cholelithiasis vs gastritis  - S/p lap Band removal for nausea and vomiting 12/2014 - no indication for surgical intervention at this time - will keep on PPI, continue Zosyn for now, appreciate surgery team following  - allow antiemetics as needed   ESRD on HD - per nephrology team, appreciate assistance   Morbid obesity - Body mass index is 48.54 kg/m  Hepatic steatosis - Incidental finding on CT - Current LFTs within normal limits - Likely either familial versus related to persistent obesity  DVT prophylaxis: Heparin SQ Code Status: Full  Family Communication: Patient at bedside  Disposition Plan: Home in few days   Consultants:   Nephrology  Surgery   Procedures:   None  Antimicrobials:    Zosyn 11/09 -->   Subjective: Still with abdominal pain.  Objective: Vitals:   06/12/16 1321 06/12/16 2123 06/13/16 0454 06/13/16 1342  BP: 108/62 135/68 (!) 95/51 132/64  Pulse: 84 85 81 77  Resp: 19 17 17 17   Temp: 97.6 F (36.4 C) 99.7 F (37.6 C) 98.9 F (37.2 C) 98.9 F (37.2 C)  TempSrc: Oral Oral Oral Oral  SpO2: 100% 100% 98% 98%  Weight:        Intake/Output Summary (Last 24 hours) at 06/13/16 1415 Last data filed at  06/13/16 1343  Gross per 24 hour  Intake                0 ml  Output                0 ml  Net                0 ml   Filed Weights   06/12/16 0429 06/12/16 0730 06/12/16 1126  Weight: (!) 138.2 kg (304 lb 10.8 oz) (!) 137.9 kg (304 lb 0.2 oz) (!) 136.4 kg (300 lb 11.3 oz)    Examination:  General exam: Appears calm and comfortable  Respiratory system: Clear to auscultation. Respiratory effort normal. Cardiovascular system: S1 & S2 heard, RRR. No JVD, murmurs, rubs, gallops or clicks. No pedal edema. Gastrointestinal system: Abdomen is nondistended, soft and nontender. No organomegaly or masses felt. Normal bowel sounds heard. Central nervous system: Alert and oriented. No focal neurological deficits.   Data Reviewed: I have personally reviewed following labs and imaging studies  CBC:  Recent Labs Lab 06/11/16 0810 06/12/16 0332 06/13/16 0431  WBC 11.2* 9.8 11.9*  NEUTROABS 9.4*  --   --   HGB 14.1 12.6* 13.1  HCT 41.0 37.0* 38.7*  MCV 90.9 91.6 91.3  PLT 223 200 938   Basic Metabolic Panel:  Recent Labs Lab 06/11/16 0810 06/12/16 0332 06/13/16 0431  NA 137 137 136  K 3.8 3.3* 4.2  CL 93*  94* 95*  CO2 26 26 20*  GLUCOSE 130* 93 93  BUN 35* 51* 34*  CREATININE 8.94* 11.50* 9.31*  CALCIUM 8.4* 7.8* 8.3*  PHOS  --   --  6.9*   Liver Function Tests:  Recent Labs Lab 06/11/16 0810 06/12/16 0332 06/13/16 0431  AST 23 15  --   ALT 25 17  --   ALKPHOS 58 49  --   BILITOT 0.9 0.8  --   PROT 8.4* 7.3  --   ALBUMIN 4.3 3.8 4.1    Recent Labs Lab 06/11/16 0810  LIPASE 30   HbA1C:  Recent Labs  06/11/16 1453  HGBA1C 5.2   Urine analysis:    Component Value Date/Time   LABSPEC 1.010 01/08/2010 1817   PHURINE >=9.0 01/08/2010 1817   GLUCOSEU 100 (A) 01/08/2010 1817   HGBUR LARGE (A) 01/08/2010 1817   BILIRUBINUR LARGE (A) 01/08/2010 1817   KETONESUR 15 (A) 01/08/2010 1817   PROTEINUR >=300 (A) 01/08/2010 1817   UROBILINOGEN >=8.0 01/08/2010  1817   NITRITE POSITIVE (A) 01/08/2010 1817   LEUKOCYTESUR (A) 01/08/2010 1817    LARGE Biochemical Testing Only. Please order routine urinalysis from main lab if confirmatory testing is needed.   Radiology Studies: Nm Hepato W/eject Fract  Result Date: 06/11/2016 CLINICAL DATA:  30 y/o  M; abdominal pain with nausea and vomiting. EXAM: NUCLEAR MEDICINE HEPATOBILIARY IMAGING WITH GALLBLADDER EF TECHNIQUE: Sequential images of the abdomen were obtained out to 60 minutes following intravenous administration of radiopharmaceutical. After slow intravenous infusion of 99 micrograms Cholecystokinin and ingestion of 8 ounces of Ensure, gallbladder ejection fraction was determined. RADIOPHARMACEUTICALS:  5.2 mCi Tc-15m Choletec IV COMPARISON:  06/11/2016 abdominal ultrasound. Hepatobiliary scintigraphy dated 12/01/2005 FINDINGS: Prompt uptake and biliary excretion of activity by the liver is seen. Gallbladder activity is visualized, consistent with patency of cystic duct. Biliary activity passes into small bowel, consistent with patent common bile duct. Calculated gallbladder ejection fraction is 39%, previously 39%. (At 60 min, normal ejection fraction is greater than 35% after cholecystokinin.) IMPRESSION: 1. Patent cystic and common bile ducts. 2. 39% gallbladder ejection fraction unchanged from 2007. Lower limits of normal. Electronically Signed   By: Kristine Garbe M.D.   On: 06/11/2016 19:24      Scheduled Meds: . diclofenac sodium  4 g Topical QID  . famotidine (PEPCID) IV  20 mg Intravenous Q24H  . heparin  5,000 Units Subcutaneous Q8H  . ketorolac  15 mg Intravenous Q6H  . metoCLOPramide (REGLAN) injection  10 mg Intravenous Q6H  . piperacillin-tazobactam (ZOSYN)  IV  3.375 g Intravenous Q12H  . predniSONE  20 mg Oral Daily  . sevelamer carbonate  2.4 g Oral TID WC   Continuous Infusions: . sodium chloride 30 mL/hr at 06/13/16 0528     LOS: 1 day   Time spent: 20 minutes    Faye Ramsay, MD [facility] Pager [phone]  If 7PM-7AM, please contact night-coverage [email or web address] Password [email or web address] 06/13/2016, 2:15 PM

## 2016-06-14 LAB — RENAL FUNCTION PANEL
Albumin: 3.9 g/dL (ref 3.5–5.0)
Anion gap: 21 — ABNORMAL HIGH (ref 5–15)
BUN: 50 mg/dL — AB (ref 6–20)
CHLORIDE: 96 mmol/L — AB (ref 101–111)
CO2: 19 mmol/L — ABNORMAL LOW (ref 22–32)
CREATININE: 12.28 mg/dL — AB (ref 0.61–1.24)
Calcium: 8 mg/dL — ABNORMAL LOW (ref 8.9–10.3)
GFR calc Af Amer: 6 mL/min — ABNORMAL LOW (ref >60)
GFR calc non Af Amer: 5 mL/min — ABNORMAL LOW (ref >60)
GLUCOSE: 88 mg/dL (ref 65–99)
POTASSIUM: 4.3 mmol/L (ref 3.5–5.1)
Phosphorus: 7.9 mg/dL — ABNORMAL HIGH (ref 2.5–4.6)
Sodium: 136 mmol/L (ref 135–145)

## 2016-06-14 LAB — CBC
HEMATOCRIT: 36.6 % — AB (ref 39.0–52.0)
Hemoglobin: 12.7 g/dL — ABNORMAL LOW (ref 13.0–17.0)
MCH: 31.3 pg (ref 26.0–34.0)
MCHC: 34.7 g/dL (ref 30.0–36.0)
MCV: 90.1 fL (ref 78.0–100.0)
PLATELETS: 230 10*3/uL (ref 150–400)
RBC: 4.06 MIL/uL — ABNORMAL LOW (ref 4.22–5.81)
RDW: 13.4 % (ref 11.5–15.5)
WBC: 12.1 10*3/uL — ABNORMAL HIGH (ref 4.0–10.5)

## 2016-06-14 LAB — MRSA PCR SCREENING: MRSA BY PCR: NEGATIVE

## 2016-06-14 MED ORDER — PROMETHAZINE HCL 25 MG/ML IJ SOLN
INTRAMUSCULAR | Status: AC
  Administered 2016-06-14: 25 mg via INTRAVENOUS
  Filled 2016-06-14: qty 1

## 2016-06-14 MED ORDER — ONDANSETRON HCL 4 MG/2ML IJ SOLN
INTRAMUSCULAR | Status: AC
  Administered 2016-06-14: 4 mg via INTRAVENOUS
  Filled 2016-06-14: qty 2

## 2016-06-14 MED ORDER — FENTANYL CITRATE (PF) 100 MCG/2ML IJ SOLN
INTRAMUSCULAR | Status: AC
  Administered 2016-06-14: 50 ug via INTRAVENOUS
  Filled 2016-06-14: qty 2

## 2016-06-14 NOTE — Progress Notes (Signed)
Patient ID: [name], male   DOB: 06/15/1986, 30 y.o.   MRN: [id number]    PROGRESS NOTE    LINDSEY HOMMEL  ZJI:[id number] DOB: February 15, 1986 DOA: 06/11/2016  PCP: Philis Fendt, MD   Brief Narrative:  30 y.o. male with medical history significant for chronic kidney disease on dialysis, morbid obesity with a BMI 47, chronic arthritis of the knees, secondary hyperparathyroidism, history of laparoscopic banding procedure to 80 and weight loss with band subsequently surgically removed. Patient reports that he began having significant abdominal pain with nausea and vomiting after eating a hotdog and vegetables last night. He has been unable to keep any food down.   Assessment & Plan: Abdominal pain with hx of gastric emptying problems, ? Cholelithiasis vs gastritis  - S/p lap Band removal for nausea and vomiting 12/2014 - no indication for surgical intervention at this time - will keep on PPI, continue Zosyn for now, appreciate surgery team following  - allow antiemetics as needed  - try to advance diet to clears to see if pt tolerating   ESRD on HD - per nephrology team, appreciate assistance  - overall tolerating HD well but reports frequent spasms during the session - OK to use robaxin as needed   Morbid obesity - Body mass index is 48.54 kg/m  Hepatic steatosis - Incidental finding on CT - Current LFTs within normal limits - Likely either familial versus related to persistent obesity  DVT prophylaxis: Heparin SQ Code Status: Full  Family Communication: Patient at bedside  Disposition Plan: Home in few days   Consultants:   Nephrology  Surgery   Procedures:   None  Antimicrobials:    Zosyn 11/09 -->   Subjective: Still with abdominal pain but says overall better.   Objective: Vitals:   06/14/16 0345 06/14/16 0415 06/14/16 0445 06/14/16 0500  BP: 137/70 136/77 (!) 142/61 (!) 134/56  Pulse: 84 86 88 89  Resp: 20 20 20 20   Temp:    98.2 F (36.8 C)    TempSrc:    Oral  SpO2:    95%  Weight:    135.5 kg (298 lb 11.6 oz)    Intake/Output Summary (Last 24 hours) at 06/14/16 1109 Last data filed at 06/14/16 0940  Gross per 24 hour  Intake              590 ml  Output             1200 ml  Net             -610 ml   Filed Weights   06/12/16 1126 06/14/16 0115 06/14/16 0500  Weight: (!) 136.4 kg (300 lb 11.3 oz) (!) 136.6 kg (301 lb 2.4 oz) 135.5 kg (298 lb 11.6 oz)    Examination:  General exam: Appears calm and comfortable  Respiratory system: Clear to auscultation. Respiratory effort normal. Cardiovascular system: S1 & S2 heard, RRR. No JVD, murmurs, rubs, gallops or clicks. No pedal edema. Gastrointestinal system: Abdomen is nondistended, soft and nontender. No organomegaly or masses felt. Normal bowel sounds heard. Central nervous system: Alert and oriented. No focal neurological deficits.   Data Reviewed: I have personally reviewed following labs and imaging studies  CBC:  Recent Labs Lab 06/11/16 0810 06/12/16 0332 06/13/16 0431 06/14/16 0200  WBC 11.2* 9.8 11.9* 12.1*  NEUTROABS 9.4*  --   --   --   HGB 14.1 12.6* 13.1 12.7*  HCT 41.0 37.0* 38.7* 36.6*  MCV 90.9  91.6 91.3 90.1  PLT 223 200 217 003   Basic Metabolic Panel:  Recent Labs Lab 06/11/16 0810 06/12/16 0332 06/13/16 0431 06/14/16 0200  NA 137 137 136 136  K 3.8 3.3* 4.2 4.3  CL 93* 94* 95* 96*  CO2 26 26 20* 19*  GLUCOSE 130* 93 93 88  BUN 35* 51* 34* 50*  CREATININE 8.94* 11.50* 9.31* 12.28*  CALCIUM 8.4* 7.8* 8.3* 8.0*  PHOS  --   --  6.9* 7.9*   Liver Function Tests:  Recent Labs Lab 06/11/16 0810 06/12/16 0332 06/13/16 0431 06/14/16 0200  AST 23 15  --   --   ALT 25 17  --   --   ALKPHOS 58 49  --   --   BILITOT 0.9 0.8  --   --   PROT 8.4* 7.3  --   --   ALBUMIN 4.3 3.8 4.1 3.9    Recent Labs Lab 06/11/16 0810  LIPASE 30   HbA1C:  Recent Labs  06/11/16 1453  HGBA1C 5.2   Urine analysis:    Component Value  Date/Time   LABSPEC 1.010 01/08/2010 1817   PHURINE >=9.0 01/08/2010 1817   GLUCOSEU 100 (A) 01/08/2010 1817   HGBUR LARGE (A) 01/08/2010 1817   BILIRUBINUR LARGE (A) 01/08/2010 1817   KETONESUR 15 (A) 01/08/2010 1817   PROTEINUR >=300 (A) 01/08/2010 1817   UROBILINOGEN >=8.0 01/08/2010 1817   NITRITE POSITIVE (A) 01/08/2010 1817   LEUKOCYTESUR (A) 01/08/2010 1817    LARGE Biochemical Testing Only. Please order routine urinalysis from main lab if confirmatory testing is needed.   Radiology Studies: No results found.    Scheduled Meds: . diclofenac sodium  4 g Topical QID  . famotidine (PEPCID) IV  20 mg Intravenous Q24H  . heparin  5,000 Units Subcutaneous Q8H  . ketorolac  15 mg Intravenous Q6H  . metoCLOPramide (REGLAN) injection  10 mg Intravenous Q6H  . piperacillin-tazobactam (ZOSYN)  IV  3.375 g Intravenous Q12H  . predniSONE  20 mg Oral Daily  . sevelamer carbonate  2.4 g Oral TID WC   Continuous Infusions: . sodium chloride 30 mL/hr at 06/13/16 0528     LOS: 2 days   Time spent: 20 minutes   Faye Ramsay, MD [facility] Pager [phone]  If 7PM-7AM, please contact night-coverage [email or web address] Password [email or web address] 06/14/2016, 11:09 AM

## 2016-06-14 NOTE — Progress Notes (Signed)
  Subjective: Complains of diffuse upper abd pain improved with meds, the nausea is more bothersome than the pain  Objective: Vital signs in last 24 hours: Temp:  [98.1 F (36.7 C)-98.9 F (37.2 C)] 98.2 F (36.8 C) (11/12 0500) Pulse Rate:  [77-89] 89 (11/12 0500) Resp:  [16-20] 20 (11/12 0500) BP: (93-152)/(56-77) 134/56 (11/12 0500) SpO2:  [95 %-98 %] 95 % (11/12 0500) Weight:  [135.5 kg (298 lb 11.6 oz)-136.6 kg (301 lb 2.4 oz)] 135.5 kg (298 lb 11.6 oz) (11/12 0500) Last BM Date: 06/12/16  Intake/Output from previous day: 11/11 0701 - 11/12 0700 In: 590 [I.V.:540; IV Piggyback:50] Out: 1200  Intake/Output this shift: No intake/output data recorded.  Resp: clear to auscultation bilaterally Cardio: regular rate and rhythm GI: soft, tender over upper abdomen  Lab Results:   Recent Labs  06/13/16 0431 06/14/16 0200  WBC 11.9* 12.1*  HGB 13.1 12.7*  HCT 38.7* 36.6*  PLT 217 230   BMET  Recent Labs  06/13/16 0431 06/14/16 0200  NA 136 136  K 4.2 4.3  CL 95* 96*  CO2 20* 19*  GLUCOSE 93 88  BUN 34* 50*  CREATININE 9.31* 12.28*  CALCIUM 8.3* 8.0*   PT/INR No results for input(s): LABPROT, INR in the last 72 hours. ABG No results for input(s): PHART, HCO3 in the last 72 hours.  Invalid input(s): PCO2, PO2  Studies/Results: No results found.  Anti-infectives: Anti-infectives    Start     Dose/Rate Route Frequency Ordered Stop   06/11/16 1800  piperacillin-tazobactam (ZOSYN) IVPB 3.375 g     3.375 g 12.5 mL/hr over 240 Minutes Intravenous Every 12 hours 06/11/16 1655     06/11/16 1500  ciprofloxacin (CIPRO) IVPB 400 mg  Status:  Discontinued     400 mg 200 mL/hr over 60 Minutes Intravenous Every 24 hours 06/11/16 1436 06/11/16 1614      Assessment/Plan: s/p * No surgery found * Pt has known gallstones. Pain seems inconsistent with just symptomatic gallstones Continue zosyn Consider gastric emptying study Will follow  LOS: 2 days     [name] 06/14/2016

## 2016-06-14 NOTE — Progress Notes (Signed)
[facility] Progress Note   Subjective:  Says abdominal pain is a little better, but went for walk this morning, which seemed to trigger the pain again. No n/v this morning yet. No dyspnea.    Objective Vitals:   06/14/16 0345 06/14/16 0415 06/14/16 0445 06/14/16 0500  BP: 137/70 136/77 (!) 142/61 (!) 134/56  Pulse: 84 86 88 89  Resp: 20 20 20 20   Temp:    98.2 F (36.8 C)  TempSrc:    Oral  SpO2:    95%  Weight:    135.5 kg (298 lb 11.6 oz)   Physical Exam General: Well appearing male, NAD at this time. Heart: RRR; no murmur. Lungs: CTAB. Abdomen: tender in epigastric area (improved from yesterday's exam); no lower abdominal tenderness Extremities: No LE edema. Dialysis Access: LUE AVF + thrill  Additional Objective Labs: Basic Metabolic Panel:  Recent Labs Lab 06/12/16 0332 06/13/16 0431 06/14/16 0200  NA 137 136 136  K 3.3* 4.2 4.3  CL 94* 95* 96*  CO2 26 20* 19*  GLUCOSE 93 93 88  BUN 51* 34* 50*  CREATININE 11.50* 9.31* 12.28*  CALCIUM 7.8* 8.3* 8.0*  PHOS  --  6.9* 7.9*   Liver Function Tests:  Recent Labs Lab 06/11/16 0810 06/12/16 0332 06/13/16 0431 06/14/16 0200  AST 23 15  --   --   ALT 25 17  --   --   ALKPHOS 58 49  --   --   BILITOT 0.9 0.8  --   --   PROT 8.4* 7.3  --   --   ALBUMIN 4.3 3.8 4.1 3.9    Recent Labs Lab 06/11/16 0810  LIPASE 30   CBC:  Recent Labs Lab 06/11/16 0810 06/12/16 0332 06/13/16 0431 06/14/16 0200  WBC 11.2* 9.8 11.9* 12.1*  NEUTROABS 9.4*  --   --   --   HGB 14.1 12.6* 13.1 12.7*  HCT 41.0 37.0* 38.7* 36.6*  MCV 90.9 91.6 91.3 90.1  PLT 223 200 217 230   Medications: . sodium chloride 30 mL/hr at 06/13/16 0528   . diclofenac sodium  4 g Topical QID  . famotidine (PEPCID) IV  20 mg Intravenous Q24H  . heparin  5,000 Units Subcutaneous Q8H  . ketorolac  15 mg Intravenous Q6H  . metoCLOPramide (REGLAN) injection  10 mg Intravenous Q6H  . piperacillin-tazobactam (ZOSYN)  IV   3.375 g Intravenous Q12H  . predniSONE  20 mg Oral Daily  . sevelamer carbonate  2.4 g Oral TID WC    Dialysis Orders: MWFSat schedule at [facility] 4:45 hours, EDW 134.5kg, 2K/2.5Ca, AVF, BFR 450/DFR 800 - Calcitriol 0.70mcg PO q HD - No ESA or IV iron  Assessment/Plan: 1.  Abdominal pain/ ?gastritis: Per primary, note says he should be on PPI. Looks like just on IV Pepcid and Reglan. Still on Zosyn and WBC inching up. Symptoms still seem a little intense for gastritis alone, would strongly recommend GI input.  2.  ESRD: Continue HD per usual MWFSat schedule. Next HD 11/13. 3.  Hypotension/volume: Chronic hypotension, does not tolerate midodrine. BP higher than usual today. 4.  Anemia: Hgb 12.7. No ESA for now. 5.  Metabolic bone disease: Ca ok, Phos 7.9. Continue Renvela pwdr as binder for now (has missed a lot while here), may need to change binder to see if helps N/V, although it would not be causing the pain.    Veneta Penton, [name] 06/14/2016, 11:18 AM  [employer]  Pager: [phone]  I have seen and examined this patient and agree with plan and assessment in the above note with renal recommendations/intervention highlighted. Still need to consider ischemic colitis as source of abdominal pain out of proportion to exam in patient with large IDWG and chronic hypotension. Await GI eval Governor Rooks Ceilidh Torregrossa,MD 06/14/2016 11:49 AM

## 2016-06-15 DIAGNOSIS — R1013 Epigastric pain: Secondary | ICD-10-CM

## 2016-06-15 LAB — CBC
HCT: 37.7 % — ABNORMAL LOW (ref 39.0–52.0)
Hemoglobin: 12.8 g/dL — ABNORMAL LOW (ref 13.0–17.0)
MCH: 30.8 pg (ref 26.0–34.0)
MCHC: 34 g/dL (ref 30.0–36.0)
MCV: 90.6 fL (ref 78.0–100.0)
PLATELETS: 215 10*3/uL (ref 150–400)
RBC: 4.16 MIL/uL — ABNORMAL LOW (ref 4.22–5.81)
RDW: 13.6 % (ref 11.5–15.5)
WBC: 11.9 10*3/uL — ABNORMAL HIGH (ref 4.0–10.5)

## 2016-06-15 LAB — RENAL FUNCTION PANEL
Albumin: 3.8 g/dL (ref 3.5–5.0)
Anion gap: 15 (ref 5–15)
BUN: 34 mg/dL — ABNORMAL HIGH (ref 6–20)
CALCIUM: 7.9 mg/dL — AB (ref 8.9–10.3)
CHLORIDE: 95 mmol/L — AB (ref 101–111)
CO2: 26 mmol/L (ref 22–32)
CREATININE: 9.8 mg/dL — AB (ref 0.61–1.24)
GFR calc Af Amer: 7 mL/min — ABNORMAL LOW (ref >60)
GFR calc non Af Amer: 6 mL/min — ABNORMAL LOW (ref >60)
GLUCOSE: 88 mg/dL (ref 65–99)
Phosphorus: 6.7 mg/dL — ABNORMAL HIGH (ref 2.5–4.6)
Potassium: 3.6 mmol/L (ref 3.5–5.1)
SODIUM: 136 mmol/L (ref 135–145)

## 2016-06-15 LAB — HEPATITIS B SURFACE ANTIGEN: Hepatitis B Surface Ag: NEGATIVE

## 2016-06-15 MED ORDER — TRAMADOL HCL 50 MG PO TABS: 50.0000 mg | ORAL_TABLET | Freq: Three times a day (TID) | ORAL | 0 refills | Status: DC | PRN

## 2016-06-15 MED ORDER — METHOCARBAMOL 500 MG PO TABS: 500.0000 mg | ORAL_TABLET | Freq: Three times a day (TID) | ORAL | 0 refills | Status: DC | PRN

## 2016-06-15 NOTE — Progress Notes (Signed)
MD paged to see about advancing diet. Per MD, Dr. Doyle Askew, pt. Is fine to eat some solid food before leaving to go home today.

## 2016-06-15 NOTE — Discharge Summary (Signed)
Physician Discharge Summary  [name] TIR:[id number] DOB: 08-Nov-1985 DOA: 06/11/2016  PCP: Philis Fendt, MD  Admit date: 06/11/2016 Discharge date: 06/15/2016  Recommendations for Outpatient Follow-up:  1. Pt will need to follow up with PCP in 2-3 [place] post discharge 2. Pt asked to be discharged today as he was feeling better, he was made aware that surgery team recommended gastric emptying study if abd pain recurs   Discharge Diagnoses:  Principal Problem:   Abdominal pain Active Problems:   Morbid obesity, weight 291, BMI - 47   CKD (chronic kidney disease) stage V   Discharge Condition: Stable  Diet recommendation: renal diet   Brief Narrative:  30 y.o.malewith medical history significant for chronic kidney disease on dialysis, morbid obesity with a BMI 47, chronic arthritis of the knees, secondary hyperparathyroidism, history of laparoscopic banding procedure to 80 and weight loss with band subsequently surgically removed. Patient reports that he began having significant abdominal pain with nausea and vomiting after eating a hotdog and vegetables last night. He has been unable to keep any food down.   Assessment & Plan: Abdominal pain with hx of gastric emptying problems, ? Cholelithiasis vs gastritis  - S/p lap Band removal for nausea and vomiting 12/2014 - no indication for surgical intervention at this time - per surgery team recommendations, needs gastric emptying study if pain recurs  - pt wants to go home, says he feels much better   ESRD on HD - per nephrology team, appreciate assistance  - overall tolerating HD well but reports frequent spasms during the session - OK to use robaxin as needed   Morbid obesity - Body mass index is 48.54 kg/m  Hepatic steatosis - Incidental finding on CT - Current LFTs within normal limits - Likely either familial versus related to persistent obesity  DVT prophylaxis: Heparin SQ Code Status: Full  Family  Communication: Patient at bedside  Disposition Plan: Home   Consultants:   Nephrology  Surgery   Procedures:   None  Antimicrobials:    Zosyn 11/09 -->   Procedures/Studies: US Abdomen Complete  Result Date: 06/11/2016 CLINICAL DATA:  Onset of upper abdominal pain last night. EXAM: ABDOMEN ULTRASOUND COMPLETE COMPARISON:  Abdominal and pelvic CT scan of Dec 27, 2014 FINDINGS: The study is limited overall due to the patient's body habitus. Gallbladder: There are multiple gallstones present with the largest measuring 6 cm in diameter. There is no gallbladder wall thickening, pericholecystic fluid, or positive sonographic Murphy's sign. Common bile duct: Diameter: 5 mm Liver: The hepatic echotexture is mildly increased diffusely. There is no focal mass nor ductal dilation. IVC: No abnormality visualized. Pancreas: The pancreas was largely obscured by bowel gas. The visualized portions of the pancreatic head and body are normal. Spleen: Size and appearance within normal limits. Right Kidney: Length: 9.3 cm. The renal cortical echotexture is increased diffusely. There are mid and lower pole cortical cysts measuring up to 1.3 cm in diameter. There is no hydronephrosis. Left Kidney: Length: 9.7 cm. The renal cortical echotexture is diffusely increased similar to that of the right kidney. There is an upper pole cyst measuring 2.9 x 3.4 x 2.7 cm. A second cyst located medially measures 1.6 x 1.8 x 2.1 cm. There is no hydronephrosis. Abdominal aorta: No aneurysm visualized. The bifurcation was obscured by bowel gas. Other findings: No ascites is observed. IMPRESSION: 1. Gallstones without sonographic evidence of acute cholecystitis. Increased hepatic echotexture consistent with fatty infiltration. 2. Bilateral renal cortical atrophy and increased  cortical echotexture consistent with medical renal disease. Bilateral simple appearing renal cysts. No hydronephrosis. 3. Bowel gas obscured the aortic  bifurcation. The visualized portions of the abdominal aorta are normal in caliber. Electronically Signed   By: David  Martinique M.D.   On: 06/11/2016 13:57   Nm Hepato W/eject Fract  Result Date: 06/11/2016 CLINICAL DATA:  30 y/o  M; abdominal pain with nausea and vomiting. EXAM: NUCLEAR MEDICINE HEPATOBILIARY IMAGING WITH GALLBLADDER EF TECHNIQUE: Sequential images of the abdomen were obtained out to 60 minutes following intravenous administration of radiopharmaceutical. After slow intravenous infusion of 99 micrograms Cholecystokinin and ingestion of 8 ounces of Ensure, gallbladder ejection fraction was determined. RADIOPHARMACEUTICALS:  5.2 mCi Tc-36m Choletec IV COMPARISON:  06/11/2016 abdominal ultrasound. Hepatobiliary scintigraphy dated 12/01/2005 FINDINGS: Prompt uptake and biliary excretion of activity by the liver is seen. Gallbladder activity is visualized, consistent with patency of cystic duct. Biliary activity passes into small bowel, consistent with patent common bile duct. Calculated gallbladder ejection fraction is 39%, previously 39%. (At 60 min, normal ejection fraction is greater than 35% after cholecystokinin.) IMPRESSION: 1. Patent cystic and common bile ducts. 2. 39% gallbladder ejection fraction unchanged from 2007. Lower limits of normal. Electronically Signed   By: Kristine Garbe M.D.   On: 06/11/2016 19:24   Dg Abd Acute W/chest  Result Date: 06/11/2016 CLINICAL DATA:  Nausea and vomiting beginning 1 night ago. EXAM: DG ABDOMEN ACUTE W/ 1V CHEST COMPARISON:  Chest in two views abdomen 12/31/2014. FINDINGS: Single-view of the chest demonstrates clear lungs. Heart size is upper normal. Lung volumes are somewhat low. No pneumothorax or pleural effusion. Two views of the abdomen show no free intraperitoneal air. The bowel gas pattern is unremarkable. No abnormal abdominal calcification is seen. No focal bony lesion is identified. All imaged bones demonstrate somewhat increased  density suggested renal osteodystrophy in this patient with a history of renal insufficiency. IMPRESSION: No acute abnormality chest or abdomen. Electronically Signed   By: Inge Rise M.D.   On: 06/11/2016 09:01     Discharge Exam: Vitals:   06/15/16 1030 06/15/16 1045  BP: 91/60 (!) 82/58  Pulse: 69 68  Resp:    Temp:     Vitals:   06/15/16 0945 06/15/16 1000 06/15/16 1030 06/15/16 1045  BP: (!) 85/64 (!) 85/55 91/60 (!) 82/58  Pulse: 71 65 69 68  Resp:      Temp:      TempSrc:      SpO2:      Weight:        General: Pt is alert, follows commands appropriately, not in acute distress Cardiovascular: Regular rate and rhythm, S1/S2 +, no murmurs, no rubs, no gallops Respiratory: Clear to auscultation bilaterally, no wheezing, no crackles, no rhonchi Abdominal: Soft, non tender, non distended, bowel sounds +, no guarding  Discharge Instructions  Discharge Instructions    Diet - low sodium heart healthy    Complete by:  As directed    Increase activity slowly    Complete by:  As directed        Medication List    STOP taking these medications   azithromycin 250 MG tablet Commonly known as:  ZITHROMAX   benzonatate 100 MG capsule Commonly known as:  TESSALON     TAKE these medications   albuterol 108 (90 Base) MCG/ACT inhaler Commonly known as:  PROVENTIL HFA;VENTOLIN HFA Inhale 2 puffs into the lungs every 4 (four) hours as needed for wheezing or shortness of breath.  calcium carbonate 500 MG chewable tablet Commonly known as:  TUMS - dosed in mg elemental calcium Chew 2 tablets by mouth 2 (two) times daily as needed for indigestion or heartburn.   diazepam 10 MG tablet Commonly known as:  VALIUM Take 1 tablet (10 mg total) by mouth every 6 (six) hours as needed for anxiety.   docusate sodium 100 MG capsule Commonly known as:  COLACE Take 1 capsule (100 mg total) by mouth every 12 (twelve) hours.   glycopyrrolate 2 MG tablet Commonly known as:   ROBINUL Take 1 tablet (2 mg total) by mouth 2 (two) times daily.   Hyoscyamine Sulfate 0.375 MG Tbcr Take 1 tablet twice daily   lidocaine 2 % jelly Commonly known as:  XYLOCAINE Apply 1 application topically as needed.   methocarbamol 500 MG tablet Commonly known as:  ROBAXIN Take 1 tablet (500 mg total) by mouth every 8 (eight) hours as needed for muscle spasms. What changed:  when to take this  reasons to take this   multivitamin Tabs tablet Take 1 tablet by mouth daily.   predniSONE 10 MG tablet Commonly known as:  DELTASONE Take 2 tablets (20 mg total) by mouth daily.   sevelamer carbonate 2.4 g Pack Commonly known as:  RENVELA Take 2.4 g by mouth 3 (three) times daily with meals.   traMADol 50 MG tablet Commonly known as:  ULTRAM Take 1 tablet (50 mg total) by mouth every 8 (eight) hours as needed. What changed:  See the new instructions.   VOLTAREN 1 % Gel Generic drug:  diclofenac sodium Apply 4 g topically 4 (four) times daily.      Follow-up Information    Philis Fendt, MD Follow up.   Specialty:  Internal Medicine Contact information: [place] [address] [phone]            The results of significant diagnostics from this hospitalization (including imaging, microbiology, ancillary and laboratory) are listed below for reference.     Microbiology: Recent Results (from the past 240 hour(s))  MRSA PCR Screening     Status: None   Collection Time: 06/14/16 10:45 AM  Result Value Ref Range Status   MRSA by PCR NEGATIVE NEGATIVE Final    Comment:        The GeneXpert MRSA Assay (FDA approved for NASAL specimens only), is one component of a comprehensive MRSA colonization surveillance program. It is not intended to diagnose MRSA infection nor to guide or monitor treatment for MRSA infections.      Labs: Basic Metabolic Panel:  Recent Labs Lab 06/11/16 0810 06/12/16 0332 06/13/16 0431 06/14/16 0200  06/15/16 0810  NA 137 137 136 136 136  K 3.8 3.3* 4.2 4.3 3.6  CL 93* 94* 95* 96* 95*  CO2 26 26 20* 19* 26  GLUCOSE 130* 93 93 88 88  BUN 35* 51* 34* 50* 34*  CREATININE 8.94* 11.50* 9.31* 12.28* 9.80*  CALCIUM 8.4* 7.8* 8.3* 8.0* 7.9*  PHOS  --   --  6.9* 7.9* 6.7*   Liver Function Tests:  Recent Labs Lab 06/11/16 0810 06/12/16 0332 06/13/16 0431 06/14/16 0200 06/15/16 0810  AST 23 15  --   --   --   ALT 25 17  --   --   --   ALKPHOS 58 49  --   --   --   BILITOT 0.9 0.8  --   --   --   PROT 8.4* 7.3  --   --   --  ALBUMIN 4.3 3.8 4.1 3.9 3.8    Recent Labs Lab 06/11/16 0810  LIPASE 30   No results for input(s): AMMONIA in the last 168 hours. CBC:  Recent Labs Lab 06/11/16 0810 06/12/16 0332 06/13/16 0431 06/14/16 0200 06/15/16 0810  WBC 11.2* 9.8 11.9* 12.1* 11.9*  NEUTROABS 9.4*  --   --   --   --   HGB 14.1 12.6* 13.1 12.7* 12.8*  HCT 41.0 37.0* 38.7* 36.6* 37.7*  MCV 90.9 91.6 91.3 90.1 90.6  PLT 223 200 217 230 215     SIGNED: Time coordinating discharge: 30 minutes  MAGICK-Dheeraj Hail, MD  [facility] 06/15/2016, 11:01 AM Pager [phone]  If 7PM-7AM, please contact night-coverage [email or web address] Password [email or web address]

## 2016-06-15 NOTE — Progress Notes (Signed)
Patient ID: [name], male   DOB: May 07, 1986, 30 y.o.   MRN: [id number]    PROGRESS NOTE    [name]  TMH:[id number] DOB: [birth year] DOA: 06/11/2016  PCP: Philis Fendt, MD   Brief Narrative:  30 y.o. male with medical history significant for chronic kidney disease on dialysis, morbid obesity with a BMI 47, chronic arthritis of the knees, secondary hyperparathyroidism, history of laparoscopic banding procedure to 80 and weight loss with band subsequently surgically removed. Patient reports that he began having significant abdominal pain with nausea and vomiting after eating a hotdog and vegetables last night. He has been unable to keep any food down.   Assessment & Plan: Abdominal pain with hx of gastric emptying problems, ? Cholelithiasis vs gastritis  - S/p lap Band removal for nausea and vomiting 12/2014 - no indication for surgical intervention at this time - will keep on PPI, continue Zosyn for now, appreciate surgery team following  - allow antiemetics as needed  - per surgery team recommendations, ordered gastric emptying study  ESRD on HD - per nephrology team, appreciate assistance  - overall tolerating HD well but reports frequent spasms during the session - OK to use robaxin as needed   Morbid obesity - Body mass index is 48.54 kg/m  Hepatic steatosis - Incidental finding on CT - Current LFTs within normal limits - Likely either familial versus related to persistent obesity  DVT prophylaxis: Heparin SQ Code Status: Full  Family Communication: Patient at bedside  Disposition Plan: Home when pt's pain controlled   Consultants:   Nephrology  Surgery   Procedures:   None  Antimicrobials:    Zosyn 11/09 -->   Subjective: Still with abdominal pain but says overall better.   Objective: Vitals:   06/15/16 0900 06/15/16 0930 06/15/16 0945 06/15/16 1000  BP: 91/66 (!) 89/61 (!) 85/64 (!) 85/55  Pulse: 75 70 71 65  Resp:      Temp:        TempSrc:      SpO2:      Weight:        Intake/Output Summary (Last 24 hours) at 06/15/16 1017 Last data filed at 06/15/16 0554  Gross per 24 hour  Intake              990 ml  Output                0 ml  Net              990 ml   Filed Weights   06/14/16 0115 06/14/16 0500 06/15/16 0755  Weight: (!) 136.6 kg (301 lb 2.4 oz) 135.5 kg (298 lb 11.6 oz) (!) 136.8 kg (301 lb 9.4 oz)    Examination:  General exam: Appears calm and comfortable  Respiratory system: Clear to auscultation. Respiratory effort normal. Cardiovascular system: S1 & S2 heard, RRR. No JVD, murmurs, rubs, gallops or clicks. No pedal edema. Gastrointestinal system: Abdomen is nondistended, soft and nontender. No organomegaly or masses felt. Normal bowel sounds heard. Central nervous system: Alert and oriented. No focal neurological deficits.   Data Reviewed: I have personally reviewed following labs and imaging studies  CBC:  Recent Labs Lab 06/11/16 0810 06/12/16 0332 06/13/16 0431 06/14/16 0200 06/15/16 0810  WBC 11.2* 9.8 11.9* 12.1* 11.9*  NEUTROABS 9.4*  --   --   --   --   HGB 14.1 12.6* 13.1 12.7* 12.8*  HCT 41.0 37.0* 38.7* 36.6* 37.7*  MCV 90.9 91.6 91.3 90.1 90.6  PLT 223 200 217 230 009   Basic Metabolic Panel:  Recent Labs Lab 06/11/16 0810 06/12/16 0332 06/13/16 0431 06/14/16 0200 06/15/16 0810  NA 137 137 136 136 136  K 3.8 3.3* 4.2 4.3 3.6  CL 93* 94* 95* 96* 95*  CO2 26 26 20* 19* 26  GLUCOSE 130* 93 93 88 88  BUN 35* 51* 34* 50* 34*  CREATININE 8.94* 11.50* 9.31* 12.28* 9.80*  CALCIUM 8.4* 7.8* 8.3* 8.0* 7.9*  PHOS  --   --  6.9* 7.9* 6.7*   Liver Function Tests:  Recent Labs Lab 06/11/16 0810 06/12/16 0332 06/13/16 0431 06/14/16 0200 06/15/16 0810  AST 23 15  --   --   --   ALT 25 17  --   --   --   ALKPHOS 58 49  --   --   --   BILITOT 0.9 0.8  --   --   --   PROT 8.4* 7.3  --   --   --   ALBUMIN 4.3 3.8 4.1 3.9 3.8    Recent Labs Lab 06/11/16 0810   LIPASE 30    Radiology Studies: No results found.  Scheduled Meds: . diclofenac sodium  4 g Topical QID  . famotidine (PEPCID) IV  20 mg Intravenous Q24H  . heparin  5,000 Units Subcutaneous Q8H  . ketorolac  15 mg Intravenous Q6H  . metoCLOPramide (REGLAN) injection  10 mg Intravenous Q6H  . piperacillin-tazobactam (ZOSYN)  IV  3.375 g Intravenous Q12H  . predniSONE  20 mg Oral Daily  . sevelamer carbonate  2.4 g Oral TID WC   Continuous Infusions: . sodium chloride 30 mL/hr at 06/13/16 0528     LOS: 3 days   Time spent: 20 minutes   Faye Ramsay, MD [facility] Pager [phone]  If 7PM-7AM, please contact night-coverage [email or web address] Password [email or web address] 06/15/2016, 10:17 AM

## 2016-06-15 NOTE — Discharge Instructions (Signed)
If pain er occurs, may need GASTRIC EMPTYING STUDY    Abdominal Pain, Adult Many things can cause abdominal pain. Usually, abdominal pain is not caused by a disease and will improve without treatment. It can often be observed and treated at home. Your health care provider will do a physical exam and possibly order blood tests and X-rays to help determine the seriousness of your pain. However, in many cases, more time must pass before a clear cause of the pain can be found. Before that point, your health care provider may not know if you need more testing or further treatment. HOME CARE INSTRUCTIONS Monitor your abdominal pain for any changes. The following actions may help to alleviate any discomfort you are experiencing:  Only take over-the-counter or prescription medicines as directed by your health care provider.  Do not take laxatives unless directed to do so by your health care provider.  Try a clear liquid diet (broth, tea, or water) as directed by your health care provider. Slowly move to a bland diet as tolerated. SEEK MEDICAL CARE IF:  You have unexplained abdominal pain.  You have abdominal pain associated with nausea or diarrhea.  You have pain when you urinate or have a bowel movement.  You experience abdominal pain that wakes you in the night.  You have abdominal pain that is worsened or improved by eating food.  You have abdominal pain that is worsened with eating fatty foods.  You have a fever. SEEK IMMEDIATE MEDICAL CARE IF:  Your pain does not go away within 2 hours.  You keep throwing up (vomiting).  Your pain is felt only in portions of the abdomen, such as the right side or the left lower portion of the abdomen.  You pass bloody or black tarry stools. MAKE SURE YOU:  Understand these instructions.  Will watch your condition.  Will get help right away if you are not doing well or get worse.   This information is not intended to replace advice given to  you by your health care provider. Make sure you discuss any questions you have with your health care provider.   Document Released: 04/29/2005 Document Revised: 04/10/2015 Document Reviewed: 03/29/2013 Elsevier Interactive Patient Education [employer].

## 2016-06-15 NOTE — Procedures (Signed)
Doing well on HD.  Abd pain better.  No new c/o's.  W/U in progress.     I was present at this dialysis session, have reviewed the session itself and made  appropriate changes Kelly Splinter MD [place] pager [phone]   06/15/2016, 12:34 PM

## 2016-06-15 NOTE — Progress Notes (Signed)
Pt. Ate some of his soft diet. Tolerated fine. Pt. Is ready to go home. Went over d/c papers with pt. No questions/complaints. IV taken out. Pt. D/c'd successfully.

## 2016-06-15 NOTE — Procedures (Deleted)
Doing well on HD.  Abd pain better.  No new c/o's.  W/U in progress.     I was present at this dialysis session, have reviewed the session itself and made  appropriate changes Kelly Splinter MD [place] pager [phone]   06/15/2016, 12:34 PM

## 2016-06-15 NOTE — Progress Notes (Signed)
Subjective: Still having some nausea on and off, on clear liquids. For discharge home today.  Nursing is going to call and see about advancing diet before he goes.  He did have a gastric emptying study last year 01/22/16. After lap band.  Results below.  Objective: Vital signs in last 24 hours: Temp:  [97.5 F (36.4 C)-98.7 F (37.1 C)] 97.7 F (36.5 C) (11/13 1155) Pulse Rate:  [65-91] 78 (11/13 1155) Resp:  [14-20] 14 (11/13 1155) BP: (78-118)/(45-70) 110/69 (11/13 1155) SpO2:  [94 %-98 %] 96 % (11/13 1155) Weight:  [135.4 kg (298 lb 8.1 oz)-136.8 kg (301 lb 9.4 oz)] 135.4 kg (298 lb 8.1 oz) (11/13 1155) Last BM Date: 06/12/16 580 PO 1450 from HD BM x 1 Afebrile, VSS WBC 11.9 HIDA negative 06/11/16 Gastric study 01/22/15:  Expected location of the stomach in the left upper quadrant. Ingested meal empties the stomach gradually over the course of the study with 40% retention at 60 min and 4% retention at 120 min (normal retention less than 30% at a 120 min).     Intake/Output from previous day: 11/12 0701 - 11/13 0700 In: 990 [P.O.:580; I.V.:360; IV Piggyback:50] Out: 0  Intake/Output this shift: Total I/O In: -  Out: 1450 [Other:1450]  General appearance: alert, cooperative and no distress GI: soft, non-tender; bowel sounds normal; no masses,  no organomegaly  Lab Results:   Recent Labs  06/14/16 0200 06/15/16 0810  WBC 12.1* 11.9*  HGB 12.7* 12.8*  HCT 36.6* 37.7*  PLT 230 215    BMET  Recent Labs  06/14/16 0200 06/15/16 0810  NA 136 136  K 4.3 3.6  CL 96* 95*  CO2 19* 26  GLUCOSE 88 88  BUN 50* 34*  CREATININE 12.28* 9.80*  CALCIUM 8.0* 7.9*   PT/INR No results for input(s): LABPROT, INR in the last 72 hours.   Recent Labs Lab 06/11/16 0810 06/12/16 0332 06/13/16 0431 06/14/16 0200 06/15/16 0810  AST 23 15  --   --   --   ALT 25 17  --   --   --   ALKPHOS 58 49  --   --   --   BILITOT 0.9 0.8  --   --   --   PROT 8.4* 7.3  --    --   --   ALBUMIN 4.3 3.8 4.1 3.9 3.8     Lipase     Component Value Date/Time   LIPASE 30 06/11/2016 0810     Studies/Results: No results found.  Medications: . diclofenac sodium  4 g Topical QID  . famotidine (PEPCID) IV  20 mg Intravenous Q24H  . heparin  5,000 Units Subcutaneous Q8H  . ketorolac  15 mg Intravenous Q6H  . metoCLOPramide (REGLAN) injection  10 mg Intravenous Q6H  . piperacillin-tazobactam (ZOSYN)  IV  3.375 g Intravenous Q12H  . predniSONE  20 mg Oral Daily  . sevelamer carbonate  2.4 g Oral TID WC    Assessment/Plan Abdominal pain with hx of gastric emptying issues Hx of chronic abdominal pain and nausea S/p lap Band removal for nausea and vomiting 12/2014 Cholelithiasis, possible cholecystitis  - HIDA negative GERD ESRD - HD  MWF Hypertension Hyperparathyroidism Body mass index is 48.54 kg/m. FEN:  Clears ID:  Zosyn day 5 - pharmacy dosing DVT:  SCD on Dialysis earlier today  Plan:  Dr. Lucia Gaskins has followed him for his lap band, he has had issues since that time.  He has also  been followed by GI for this.  From our standpoint he can go.  Follow up with Dr. Lucia Gaskins and GI.  Info in AVS.  LOS: 3 days    [name] 06/15/2016 [phone]

## 2016-06-23 ENCOUNTER — Telehealth: Payer: Self-pay | Admitting: *Deleted

## 2016-06-23 NOTE — Telephone Encounter (Signed)
Call for a back brace From [facility] (?). [phone] fx   Dr Letta Pate does not approve these orders from companies soliciting patients or infomercials.

## 2016-06-30 ENCOUNTER — Ambulatory Visit: Payer: Self-pay | Admitting: Physical Medicine & Rehabilitation

## 2016-07-20 ENCOUNTER — Encounter: Payer: Medicare Other | Attending: Physical Medicine & Rehabilitation

## 2016-07-20 ENCOUNTER — Ambulatory Visit: Payer: Self-pay | Admitting: Physical Medicine & Rehabilitation

## 2016-07-26 ENCOUNTER — Emergency Department (HOSPITAL_COMMUNITY)
Admission: EM | Admit: 2016-07-26 | Discharge: 2016-07-27 | Disposition: A | Discharge status: Home / Self Care | Payer: Medicare Other | Attending: Emergency Medicine | Admitting: Emergency Medicine

## 2016-07-26 ENCOUNTER — Encounter (HOSPITAL_COMMUNITY): Payer: Self-pay | Admitting: Emergency Medicine

## 2016-07-26 DIAGNOSIS — Z79899 Other long term (current) drug therapy: Secondary | ICD-10-CM | POA: Diagnosis not present

## 2016-07-26 DIAGNOSIS — I120 Hypertensive chronic kidney disease with stage 5 chronic kidney disease or end stage renal disease: Secondary | ICD-10-CM | POA: Insufficient documentation

## 2016-07-26 DIAGNOSIS — Z96651 Presence of right artificial knee joint: Secondary | ICD-10-CM | POA: Diagnosis not present

## 2016-07-26 DIAGNOSIS — Z992 Dependence on renal dialysis: Secondary | ICD-10-CM | POA: Diagnosis not present

## 2016-07-26 DIAGNOSIS — R1084 Generalized abdominal pain: Secondary | ICD-10-CM | POA: Insufficient documentation

## 2016-07-26 DIAGNOSIS — Z87891 Personal history of nicotine dependence: Secondary | ICD-10-CM | POA: Insufficient documentation

## 2016-07-26 DIAGNOSIS — R112 Nausea with vomiting, unspecified: Secondary | ICD-10-CM | POA: Insufficient documentation

## 2016-07-26 DIAGNOSIS — R1010 Upper abdominal pain, unspecified: Secondary | ICD-10-CM | POA: Diagnosis present

## 2016-07-26 DIAGNOSIS — N186 End stage renal disease: Secondary | ICD-10-CM | POA: Insufficient documentation

## 2016-07-26 LAB — COMPREHENSIVE METABOLIC PANEL
ALBUMIN: 4.9 g/dL (ref 3.5–5.0)
ALT: 32 U/L (ref 17–63)
AST: 29 U/L (ref 15–41)
Alkaline Phosphatase: 60 U/L (ref 38–126)
Anion gap: 19 — ABNORMAL HIGH (ref 5–15)
BILIRUBIN TOTAL: 0.9 mg/dL (ref 0.3–1.2)
BUN: 22 mg/dL — AB (ref 6–20)
CO2: 26 mmol/L (ref 22–32)
CREATININE: 5.72 mg/dL — AB (ref 0.61–1.24)
Calcium: 8.9 mg/dL (ref 8.9–10.3)
Chloride: 89 mmol/L — ABNORMAL LOW (ref 101–111)
GFR calc Af Amer: 14 mL/min — ABNORMAL LOW (ref >60)
GFR, EST NON AFRICAN AMERICAN: 12 mL/min — AB (ref >60)
GLUCOSE: 136 mg/dL — AB (ref 65–99)
POTASSIUM: 3.6 mmol/L (ref 3.5–5.1)
Sodium: 134 mmol/L — ABNORMAL LOW (ref 135–145)
TOTAL PROTEIN: 9.5 g/dL — AB (ref 6.5–8.1)

## 2016-07-26 LAB — CBC WITH DIFFERENTIAL/PLATELET
BASOS ABS: 0 10*3/uL (ref 0.0–0.1)
BASOS PCT: 0 %
Eosinophils Absolute: 0 10*3/uL (ref 0.0–0.7)
Eosinophils Relative: 0 %
HEMATOCRIT: 43.5 % (ref 39.0–52.0)
HEMOGLOBIN: 15.3 g/dL (ref 13.0–17.0)
LYMPHS PCT: 16 %
Lymphs Abs: 2.1 10*3/uL (ref 0.7–4.0)
MCH: 32.3 pg (ref 26.0–34.0)
MCHC: 35.2 g/dL (ref 30.0–36.0)
MCV: 91.8 fL (ref 78.0–100.0)
MONO ABS: 1.1 10*3/uL — AB (ref 0.1–1.0)
Monocytes Relative: 8 %
NEUTROS ABS: 10 10*3/uL — AB (ref 1.7–7.7)
NEUTROS PCT: 76 %
Platelets: 233 10*3/uL (ref 150–400)
RBC: 4.74 MIL/uL (ref 4.22–5.81)
RDW: 13.5 % (ref 11.5–15.5)
WBC: 13.3 10*3/uL — AB (ref 4.0–10.5)

## 2016-07-26 LAB — LIPASE, BLOOD: LIPASE: 25 U/L (ref 11–51)

## 2016-07-26 MED ORDER — SODIUM CHLORIDE 0.9 % IV BOLUS (SEPSIS)
500.0000 mL | Freq: Once | INTRAVENOUS | Status: AC
  Administered 2016-07-27: 500 mL via INTRAVENOUS

## 2016-07-26 MED ORDER — ONDANSETRON HCL 4 MG/2ML IJ SOLN
4.0000 mg | Freq: Once | INTRAMUSCULAR | Status: AC
  Administered 2016-07-26: 4 mg via INTRAVENOUS
  Filled 2016-07-26: qty 2

## 2016-07-26 MED ORDER — FENTANYL CITRATE (PF) 100 MCG/2ML IJ SOLN
50.0000 ug | Freq: Once | INTRAMUSCULAR | Status: AC
  Administered 2016-07-26: 50 ug via INTRAVENOUS
  Filled 2016-07-26: qty 2

## 2016-07-26 MED ORDER — PROMETHAZINE HCL 25 MG/ML IJ SOLN
25.0000 mg | Freq: Once | INTRAMUSCULAR | Status: AC
  Administered 2016-07-26: 25 mg via INTRAVENOUS
  Filled 2016-07-26: qty 1

## 2016-07-26 NOTE — ED Triage Notes (Signed)
Pt complaining of lower left abdominal pain. Pt states nausea and vomiting x 2 days. Pt states 4 episodes of emesis today.

## 2016-07-26 NOTE — ED Provider Notes (Signed)
[facility] DEPT Provider Note   CSN: [id number] Arrival date & time: 07/26/16  2307  By signing my name below, I, Jeanell Sparrow, attest that this documentation has been prepared under the direction and in the presence of Ezequiel Essex, MD . Electronically Signed: Jeanell Sparrow, Scribe. 07/26/2016. 11:16 PM.  History   Chief Complaint Chief Complaint  Patient presents with  . Abdominal Pain   The history is provided by the patient. No language interpreter was used.   HPI Comments: [name] is a 30 y.o. male who presents to the Emergency Department complaining of constant moderate upper abdominal pain that started this evening. He states he has been having the same pain intermittently since last month. He was admitted last month for the same pain. He reports his last dialysis was today without any problem. He has been on dialysis for the past 13 years. His pain is exacerbated by palpation. He reports associated nausea and vomiting (6 episodes over the past 6 hours). He admits to a hx of parathyroidectomy and acid (maintained by Prilosec). He denies any use of blood thinners, alcohol use, fever, hematemesis, blood in stool, or other complaints.     PCP: Philis Fendt, MD  Past Medical History:  Diagnosis Date  . Arthritis    "all over" (06/11/2016)  . Chronic lower back pain   . Complication of anesthesia    A little while to wake up after knee surgery in 2008  . Dizziness    when coming off of dialysis  . ESRD (end stage renal disease) on dialysis [facility])    MWF and goes to [facility] (06/11/2016)  . Family history of anesthesia complication    mom slow to wake up  . GERD (gastroesophageal reflux disease)    takes Omeprazole as needed  . Headache(784.0)    occasionally  . History of hiatal hernia   . Hyperparathyroidism ([place])   . Hypertension   . Joint pain   . Joint swelling   . Morbid obesity ([place])   . PONV (postoperative nausea and vomiting)   . Renal  insufficiency     Patient Active Problem List   Diagnosis Date Noted  . CKD (chronic kidney disease) stage V requiring chronic dialysis ([place]) 06/11/2016  . Nausea & vomiting 06/11/2016  . Dehydration, mild 06/11/2016  . Acute hyperglycemia 06/11/2016  . Hepatic steatosis 06/11/2016  . Cholelithiasis 06/11/2016  . Localized osteoarthritis of left knee 07/11/2015  . Abdominal pain, epigastric 12/05/2014  . Lapband April 2016 11/27/2014  . Abdominal pain 11/27/2014  . Arthritis of knee 03/27/2014  . Morbid obesity, weight 291, BMI - 47 02/15/2014  . End stage renal disease ([place]) 07/12/2013  . Hyperparathyroidism, secondary ([place]) 03/14/2013    Past Surgical History:  Procedure Laterality Date  . AV FISTULA PLACEMENT Bilateral 2005,2007   Uses Right arm  . BREATH TEK H PYLORI N/A 05/15/2014   Procedure: BREATH TEK H PYLORI;  Surgeon: Alphonsa Overall, MD;  Location: [facility];  Service: General;  Laterality: N/A;  . ESOPHAGOGASTRODUODENOSCOPY N/A 12/07/2014   Procedure: ESOPHAGOGASTRODUODENOSCOPY (EGD);  Surgeon: Alphonsa Overall, MD;  Location: [facility] ENDOSCOPY;  Service: General;  Laterality: N/A;  . ESOPHAGOGASTRODUODENOSCOPY N/A 12/17/2014   Procedure: ESOPHAGOGASTRODUODENOSCOPY (EGD);  Surgeon: Alphonsa Overall, MD;  Location: [facility];  Service: General;  Laterality: N/A;  . ESOPHAGOGASTRODUODENOSCOPY ENDOSCOPY    . HERNIA REPAIR  11/2014   w/lap band OR  . JOINT REPLACEMENT    . KNEE ARTHROSCOPY Left 2007  .  LAPAROSCOPIC GASTRIC BANDING N/A 11/12/2014   Procedure: LAPAROSCOPIC GASTRIC BANDING;  Surgeon: Alphonsa Overall, MD;  Location: [facility];  Service: General;  Laterality: N/A;  . PARATHYROIDECTOMY N/A 03/30/2013   Procedure: TOTAL PARATHYROIDECTOMY WITH AUTOTRANSPLANT;  Surgeon: Earnstine Regal, MD;  Location: [facility];  Service: General;  Laterality: N/A;  Autotransplant to left lower arm.  Marland Kitchen TOTAL KNEE ARTHROPLASTY Right 03/27/2014   Procedure: UNICOMPARTMENTAL ARTHROPLASTY;  Surgeon: Meredith Pel, MD;  Location: [place];  Service: Orthopedics;  Laterality: Right;       Home Medications    Prior to Admission medications   Medication Sig Start Date End Date Taking? Authorizing Provider  albuterol (PROVENTIL HFA;VENTOLIN HFA) 108 (90 Base) MCG/ACT inhaler Inhale 2 puffs into the lungs every 4 (four) hours as needed for wheezing or shortness of breath. 05/14/16   Lysbeth Penner, [name]  calcium carbonate (TUMS - DOSED IN MG ELEMENTAL CALCIUM) 500 MG chewable tablet Chew 2 tablets by mouth 2 (two) times daily as needed for indigestion or heartburn.     Historical Provider, MD  diazepam (VALIUM) 10 MG tablet Take 1 tablet (10 mg total) by mouth every 6 (six) hours as needed for anxiety. 10/01/15   Charlett Blake, MD  docusate sodium (COLACE) 100 MG capsule Take 1 capsule (100 mg total) by mouth every 12 (twelve) hours. 02/19/16   Comer Locket, [name]  glycopyrrolate (ROBINUL) 2 MG tablet Take 1 tablet (2 mg total) by mouth 2 (two) times daily. 01/25/15   Inda Castle, MD  Hyoscyamine Sulfate 0.375 MG TBCR Take 1 tablet twice daily 01/22/15   Inda Castle, MD  lidocaine (XYLOCAINE) 2 % jelly Apply 1 application topically as needed. 02/19/16   Comer Locket, [name]  methocarbamol (ROBAXIN) 500 MG tablet Take 1 tablet (500 mg total) by mouth every 8 (eight) hours as needed for muscle spasms. 06/15/16   Theodis Blaze, MD  multivitamin (RENA-VIT) TABS tablet Take 1 tablet by mouth daily.    Historical Provider, MD  predniSONE (DELTASONE) 10 MG tablet Take 2 tablets (20 mg total) by mouth daily. 05/14/16   Lysbeth Penner, [name]  sevelamer carbonate (RENVELA) 2.4 G PACK Take 2.4 g by mouth 3 (three) times daily with meals.     Historical Provider, MD  traMADol (ULTRAM) 50 MG tablet Take 1 tablet (50 mg total) by mouth every 8 (eight) hours as needed. 06/15/16   Theodis Blaze, MD  VOLTAREN 1 % GEL Apply 4 g topically 4 (four) times daily. 02/04/16   April Palumbo, MD    Family  History Family History  Problem Relation Age of Onset  . Hypertension Mother   . Diabetes Father   . Kidney Stones Sister   . Diabetes Maternal Grandmother   . Liver cancer Maternal Uncle     Social History Social History  Substance Use Topics  . Smoking status: Former Smoker    Packs/day: 0.20    Years: 13.00    Types: Cigarettes    Quit date: 12/17/2014  . Smokeless tobacco: Never Used  . Alcohol use No     Allergies   Patient has no known allergies.   Review of Systems Review of Systems  A complete 10 system review of systems was obtained and all systems are negative except as noted in the HPI and PMH.   Physical Exam Updated Vital Signs BP 126/79 (BP Location: Right Arm)   Pulse 101   Temp 98.2 F (36.8 C) (  Oral)   Resp (!) 32   SpO2 100%   Physical Exam  Constitutional: He is oriented to person, place, and time. He appears well-developed and well-nourished. No distress.  Uncomfortable. Morbidly obese.  HENT:  Head: Normocephalic and atraumatic.  Mouth/Throat: Oropharynx is clear and moist. No oropharyngeal exudate.  Eyes: Conjunctivae and EOM are normal. Pupils are equal, round, and reactive to light.  Neck: Normal range of motion. Neck supple.  No meningismus.  Cardiovascular: Normal rate, regular rhythm, normal heart sounds and intact distal pulses.   No murmur heard. Pulmonary/Chest: Effort normal and breath sounds normal. No respiratory distress.  Abdominal: Soft. There is tenderness. There is guarding. There is no rebound.  Diffuse abdominal TTP with voluntary guarding, worse in the epigastrium and RUQ.   Musculoskeletal: Normal range of motion. He exhibits no edema or tenderness.  Neurological: He is alert and oriented to person, place, and time. No cranial nerve deficit. He exhibits normal muscle tone. Coordination normal.   5/5 strength throughout. CN 2-12 intact.Equal grip strength.   Skin: Skin is warm.  Psychiatric: He has a normal mood and  affect. His behavior is normal.  Nursing note and vitals reviewed.    ED Treatments / Results  DIAGNOSTIC STUDIES: Oxygen Saturation is 100% on RA, normal by my interpretation.    COORDINATION OF CARE: 11:20 PM- Pt advised of plan for treatment and pt agrees.  Labs (all labs ordered are listed, but only abnormal results are displayed) Labs Reviewed  CBC WITH DIFFERENTIAL/PLATELET - Abnormal; Notable for the following:       Result Value   WBC 13.3 (*)    Neutro Abs 10.0 (*)    Monocytes Absolute 1.1 (*)    All other components within normal limits  COMPREHENSIVE METABOLIC PANEL - Abnormal; Notable for the following:    Sodium 134 (*)    Chloride 89 (*)    Glucose, Bld 136 (*)    BUN 22 (*)    Creatinine, Ser 5.72 (*)    Total Protein 9.5 (*)    GFR calc non Af Amer 12 (*)    GFR calc Af Amer 14 (*)    Anion gap 19 (*)    All other components within normal limits  LIPASE, BLOOD  TROPONIN I    EKG  EKG Interpretation  Date/Time:  Sunday July 26 2016 23:44:06 EST Ventricular Rate:  82 PR Interval:    QRS Duration: 103 QT Interval:  382 QTC Calculation: 447 R Axis:   52 Text Interpretation:  Sinus rhythm No significant change was found Confirmed by Wyvonnia Dusky  MD, Andreu Drudge [phone]) on 07/27/2016 12:19:32 AM       Radiology Dg Chest 2 View  Result Date: 07/27/2016 CLINICAL DATA:  Shortness of breath.  Abdominal pain. EXAM: CHEST  2 VIEW COMPARISON:  Radiographs 06/11/2016 FINDINGS: Low lung volumes leading to bronchovascular crowding. Unchanged heart size at the upper limits normal. No focal airspace disease, pulmonary edema, pleural effusion or pneumothorax. Surgical clips at the thoracic inlet. Ingested material within the stomach. No acute osseous abnormality. Diffuse increased bone mineral density, stable. IMPRESSION: Hypoventilatory chest without acute abnormality. Electronically Signed   By: Jeb Levering M.D.   On: 07/27/2016 01:18   Ct Abdomen Pelvis W  Contrast  Result Date: 07/27/2016 CLINICAL DATA:  Acute abdominal pain and vomiting head history a hiatal hernia, gastric banding, end-stage renal disease and hernia repair. On common ETT hemodialysis. EXAM: CT ABDOMEN AND PELVIS WITH CONTRAST TECHNIQUE: Multidetector CT  imaging of the abdomen and pelvis was performed using the standard protocol following bolus administration of intravenous contrast. CONTRAST:  169mL ISOVUE-300 IOPAMIDOL (ISOVUE-300) INJECTION 61% COMPARISON:  12/27/2014 CT FINDINGS: Lower chest: Small hiatal hernia. Normal visualized cardiac chamber size. Minimal bibasilar dependent atelectasis. No effusion or pneumothorax. Hepatobiliary: The liver enhances homogeneously. There is no biliary dilatation. The gallbladder is unremarkable. Pancreas: Normal Spleen: Normal Adrenals/Urinary Tract: Normal adrenal glands. Atrophic multi-cystic kidneys with nonobstructing right lower pole calculus. Contracted bladder. Stomach/Bowel: The stomach is moderately distended. The distal gastric body and antrum is somewhat narrowed in appearance without significant thickening. Nonemergent upper GI study may help to assure that this area is not a fixed abnormality and that the stomach is distensible and not related to an infiltrative/neoplastic abnormality of the stomach. There is normal small bowel rotation. Mild diffuse colonic spasm is noted along the ascending through transverse colon. Normal appearing appendix. Vascular/Lymphatic: No significant vascular findings are present. No enlarged abdominal or pelvic lymph nodes. Reproductive: Prostate is unremarkable. Other: No abdominal wall hernia or abnormality. No abdominopelvic ascites. Musculoskeletal: Diffuse osteosclerotic appearance of the dorsal spine consistent with renal osteodystrophy. IMPRESSION: 1. Proximal gastric distention with nondistended, narrowed appearing gastric antrum. No definite thickening of the gastric antrum. Recommend nonemergent upper  GI study to assure distention of the distal stomach and to exclude an infiltrative abnormality such as linitis of the stomach accounting for this appearance. 2. Polycystic renal disease with atrophy as before. Renal osteodystrophy. 3. Small hiatal hernia with refluxed material seen within the distal esophagus. Electronically Signed   By: Ashley Royalty M.D.   On: 07/27/2016 01:57    Procedures Procedures (including critical care time)  Medications Ordered in ED Medications  sodium chloride 0.9 % bolus 500 mL (not administered)  fentaNYL (SUBLIMAZE) injection 50 mcg (not administered)  ondansetron (ZOFRAN) injection 4 mg (4 mg Intravenous Given 07/26/16 2330)  promethazine (PHENERGAN) injection 25 mg (25 mg Intravenous Given 07/26/16 2330)     Initial Impression / Assessment and Plan / ED Course  I have reviewed the triage vital signs and the nursing notes.  Pertinent labs & imaging results that were available during my care of the patient were reviewed by me and considered in my medical decision making (see chart for details).  Clinical Course   Dialysis patient with diffuse abdominal pain nausea and vomiting that onset this evening. history of recurrent episodes. Most recently admitted in November had ultrasound that showed gallstones but negative HIDA scan. Was supposed to have an outpatient gastric emptying study which he did not have done. Last episode of dialysis was today.  Labs reassuring. EKG unchanged.  CT results d/w Dr. Barry Dienes who agrees with treatment for possible gastroparesis and followup with GI. No evidence of gastric outlet obstruction.  Patient able to tolerate PO without vomiting. Follow up with Dr. Lucia Gaskins and GI.  Will restart reglan and PPI.  Needs to have gastric emptying study as previously recommended. Return precautions discussed.   Final Clinical Impressions(s) / ED Diagnoses   Final diagnoses:  Non-intractable vomiting with nausea, unspecified vomiting type     New Prescriptions New Prescriptions   No medications on file   I personally performed the services described in this documentation, which was scribed in my presence. The recorded information has been reviewed and is accurate.     Ezequiel Essex, MD 07/27/16 [phone]

## 2016-07-27 ENCOUNTER — Emergency Department (HOSPITAL_COMMUNITY): Payer: Medicare Other

## 2016-07-27 DIAGNOSIS — R1084 Generalized abdominal pain: Secondary | ICD-10-CM | POA: Diagnosis not present

## 2016-07-27 LAB — TROPONIN I

## 2016-07-27 MED ORDER — ONDANSETRON HCL 4 MG PO TABS: 4.0000 mg | ORAL_TABLET | Freq: Four times a day (QID) | ORAL | 0 refills | Status: DC

## 2016-07-27 MED ORDER — METOCLOPRAMIDE HCL 10 MG PO TABS: 10.0000 mg | ORAL_TABLET | Freq: Four times a day (QID) | ORAL | 0 refills | Status: DC

## 2016-07-27 MED ORDER — IOPAMIDOL (ISOVUE-300) INJECTION 61%
INTRAVENOUS | Status: AC
  Administered 2016-07-27: 100 mL
  Filled 2016-07-27: qty 100

## 2016-07-27 MED ORDER — OMEPRAZOLE 20 MG PO CPDR: 20.0000 mg | DELAYED_RELEASE_CAPSULE | Freq: Every day | ORAL | 0 refills | Status: DC

## 2016-07-27 MED ORDER — METOCLOPRAMIDE HCL 5 MG/ML IJ SOLN
10.0000 mg | Freq: Once | INTRAMUSCULAR | Status: AC
  Administered 2016-07-27: 10 mg via INTRAVENOUS
  Filled 2016-07-27: qty 2

## 2016-07-27 NOTE — ED Notes (Signed)
Pt able to drink water with no n/v

## 2016-07-27 NOTE — Discharge Instructions (Signed)
Take your stomach medications as prescribed. Followup with Dr.Newman and the GI doctors. Return to the ED if you develop new or worsening symptoms.

## 2016-07-27 NOTE — ED Notes (Signed)
Patient transported to X-ray 

## 2016-07-28 ENCOUNTER — Encounter (HOSPITAL_COMMUNITY): Payer: Self-pay

## 2016-07-28 ENCOUNTER — Emergency Department (HOSPITAL_COMMUNITY)
Admission: EM | Admit: 2016-07-28 | Discharge: 2016-07-29 | Disposition: A | Discharge status: Home / Self Care | Payer: Medicare Other | Attending: Emergency Medicine | Admitting: Emergency Medicine

## 2016-07-28 DIAGNOSIS — R112 Nausea with vomiting, unspecified: Secondary | ICD-10-CM

## 2016-07-28 DIAGNOSIS — Z992 Dependence on renal dialysis: Secondary | ICD-10-CM | POA: Insufficient documentation

## 2016-07-28 DIAGNOSIS — Z96651 Presence of right artificial knee joint: Secondary | ICD-10-CM | POA: Diagnosis not present

## 2016-07-28 DIAGNOSIS — N186 End stage renal disease: Secondary | ICD-10-CM | POA: Insufficient documentation

## 2016-07-28 DIAGNOSIS — Z87891 Personal history of nicotine dependence: Secondary | ICD-10-CM | POA: Diagnosis not present

## 2016-07-28 DIAGNOSIS — I120 Hypertensive chronic kidney disease with stage 5 chronic kidney disease or end stage renal disease: Secondary | ICD-10-CM | POA: Diagnosis not present

## 2016-07-28 DIAGNOSIS — K3184 Gastroparesis: Secondary | ICD-10-CM | POA: Insufficient documentation

## 2016-07-28 DIAGNOSIS — Z79899 Other long term (current) drug therapy: Secondary | ICD-10-CM | POA: Diagnosis not present

## 2016-07-28 DIAGNOSIS — R1013 Epigastric pain: Secondary | ICD-10-CM | POA: Diagnosis present

## 2016-07-28 LAB — COMPREHENSIVE METABOLIC PANEL
ALK PHOS: 60 U/L (ref 38–126)
ALT: 25 U/L (ref 17–63)
AST: 19 U/L (ref 15–41)
Albumin: 4.5 g/dL (ref 3.5–5.0)
Anion gap: 23 — ABNORMAL HIGH (ref 5–15)
BUN: 51 mg/dL — ABNORMAL HIGH (ref 6–20)
CALCIUM: 8.4 mg/dL — AB (ref 8.9–10.3)
CO2: 19 mmol/L — AB (ref 22–32)
CREATININE: 12.07 mg/dL — AB (ref 0.61–1.24)
Chloride: 94 mmol/L — ABNORMAL LOW (ref 101–111)
GFR, EST AFRICAN AMERICAN: 6 mL/min — AB (ref >60)
GFR, EST NON AFRICAN AMERICAN: 5 mL/min — AB (ref >60)
Glucose, Bld: 92 mg/dL (ref 65–99)
Potassium: 4.4 mmol/L (ref 3.5–5.1)
SODIUM: 136 mmol/L (ref 135–145)
Total Bilirubin: 0.7 mg/dL (ref 0.3–1.2)
Total Protein: 8.4 g/dL — ABNORMAL HIGH (ref 6.5–8.1)

## 2016-07-28 LAB — CBC
HCT: 40.6 % (ref 39.0–52.0)
Hemoglobin: 14.3 g/dL (ref 13.0–17.0)
MCH: 31.4 pg (ref 26.0–34.0)
MCHC: 35.2 g/dL (ref 30.0–36.0)
MCV: 89 fL (ref 78.0–100.0)
PLATELETS: 229 10*3/uL (ref 150–400)
RBC: 4.56 MIL/uL (ref 4.22–5.81)
RDW: 13.2 % (ref 11.5–15.5)
WBC: 12.7 10*3/uL — ABNORMAL HIGH (ref 4.0–10.5)

## 2016-07-28 LAB — I-STAT CG4 LACTIC ACID, ED: LACTIC ACID, VENOUS: 2.33 mmol/L — AB (ref 0.5–1.9)

## 2016-07-28 LAB — LIPASE, BLOOD: Lipase: 36 U/L (ref 11–51)

## 2016-07-28 MED ORDER — OXYCODONE-ACETAMINOPHEN 5-325 MG PO TABS
ORAL_TABLET | ORAL | Status: AC
  Filled 2016-07-28: qty 1

## 2016-07-28 MED ORDER — HYDROMORPHONE HCL 2 MG/ML IJ SOLN
1.0000 mg | Freq: Once | INTRAMUSCULAR | Status: AC
  Administered 2016-07-28: 1 mg via INTRAVENOUS
  Filled 2016-07-28: qty 1

## 2016-07-28 MED ORDER — ONDANSETRON 4 MG PO TBDP
4.0000 mg | ORAL_TABLET | Freq: Once | ORAL | Status: AC | PRN
  Administered 2016-07-28: 4 mg via ORAL

## 2016-07-28 MED ORDER — METOCLOPRAMIDE HCL 5 MG/ML IJ SOLN
10.0000 mg | Freq: Once | INTRAMUSCULAR | Status: AC
  Administered 2016-07-28: 10 mg via INTRAVENOUS
  Filled 2016-07-28: qty 2

## 2016-07-28 MED ORDER — MORPHINE SULFATE (PF) 4 MG/ML IV SOLN: 4.0000 mg | Freq: Once | INTRAVENOUS | Status: DC

## 2016-07-28 MED ORDER — ONDANSETRON 4 MG PO TBDP
ORAL_TABLET | ORAL | Status: AC
  Filled 2016-07-28: qty 1

## 2016-07-28 MED ORDER — DIPHENHYDRAMINE HCL 50 MG/ML IJ SOLN
25.0000 mg | Freq: Once | INTRAMUSCULAR | Status: AC
  Administered 2016-07-28: 25 mg via INTRAVENOUS
  Filled 2016-07-28: qty 1

## 2016-07-28 MED ORDER — SODIUM CHLORIDE 0.9 % IV BOLUS (SEPSIS)
250.0000 mL | Freq: Once | INTRAVENOUS | Status: AC
  Administered 2016-07-28: 250 mL via INTRAVENOUS

## 2016-07-28 NOTE — ED Notes (Signed)
Pt requested Diet Ginger Ale OK per RN Elita Quick. Pt given the same

## 2016-07-28 NOTE — ED Notes (Signed)
Pt reports having a lot of recent stress due to mother passing away

## 2016-07-28 NOTE — ED Provider Notes (Signed)
[facility] DEPT Provider Note   CSN: [id number] Arrival date & time: 07/28/16  1638     History   Chief Complaint Chief Complaint  Patient presents with  . Abdominal Pain    HPI [name] is a 30 y.o. male.  HPI 30 year old male with extensive past medical history including end-stage renal disease as well as recently diagnosed gastroparesis who presents with nausea, vomiting, and abdominal pain. The patient was just seen 2 days ago for similar symptoms. He states that over the last 4 days, he has had progressively worsening nausea, vomiting, and epigastric abdominal pain. The pain is aching, gnawing, and worse with any movement or eating. He was seen 2 days ago and had an extensive workup including negative CT scan. Of note, the CT scan did show possible delayed gastric emptying consistent with gastroparesis. He was referred to outpatient GI. Since then, he has not taken any of his medications as he was not able to fill them. He has had persistent nausea and vomiting and states he has been unable to eat or drink for the last 24 hours. He subsequent presents for further evaluation. Denies any fevers. Denies any dysuria or flank pain. Denies any diarrhea.  Past Medical History:  Diagnosis Date  . Arthritis    "all over" (06/11/2016)  . Chronic lower back pain   . Complication of anesthesia    A little while to wake up after knee surgery in 2008  . Dizziness    when coming off of dialysis  . ESRD (end stage renal disease) on dialysis [facility])    MWF and goes to [facility] (06/11/2016)  . Family history of anesthesia complication    mom slow to wake up  . GERD (gastroesophageal reflux disease)    takes Omeprazole as needed  . Headache(784.0)    occasionally  . History of hiatal hernia   . Hyperparathyroidism ([place])   . Hypertension   . Joint pain   . Joint swelling   . Morbid obesity ([place])   . PONV (postoperative nausea and vomiting)   . Renal insufficiency      Patient Active Problem List   Diagnosis Date Noted  . CKD (chronic kidney disease) stage V requiring chronic dialysis ([place]) 06/11/2016  . Nausea & vomiting 06/11/2016  . Dehydration, mild 06/11/2016  . Acute hyperglycemia 06/11/2016  . Hepatic steatosis 06/11/2016  . Cholelithiasis 06/11/2016  . Localized osteoarthritis of left knee 07/11/2015  . Abdominal pain, epigastric 12/05/2014  . Lapband April 2016 11/27/2014  . Abdominal pain 11/27/2014  . Arthritis of knee 03/27/2014  . Morbid obesity, weight 291, BMI - 47 02/15/2014  . End stage renal disease ([place]) 07/12/2013  . Hyperparathyroidism, secondary ([place]) 03/14/2013    Past Surgical History:  Procedure Laterality Date  . AV FISTULA PLACEMENT Bilateral 2005,2007   Uses Right arm  . BREATH TEK H PYLORI N/A 05/15/2014   Procedure: BREATH TEK H PYLORI;  Surgeon: Alphonsa Overall, MD;  Location: [facility];  Service: General;  Laterality: N/A;  . ESOPHAGOGASTRODUODENOSCOPY N/A 12/07/2014   Procedure: ESOPHAGOGASTRODUODENOSCOPY (EGD);  Surgeon: Alphonsa Overall, MD;  Location[facility];  Service: General;  Laterality: N/A;  . ESOPHAGOGASTRODUODENOSCOPY N/A 12/17/2014   Procedure: ESOPHAGOGASTRODUODENOSCOPY (EGD);  Surgeon: Alphonsa Overall, MD;  Location: [facility];  Service: General;  Laterality: N/A;  . ESOPHAGOGASTRODUODENOSCOPY ENDOSCOPY    . HERNIA REPAIR  11/2014   w/lap band OR  . JOINT REPLACEMENT    . KNEE ARTHROSCOPY Left 2007  .  LAPAROSCOPIC GASTRIC BANDING N/A 11/12/2014   Procedure: LAPAROSCOPIC GASTRIC BANDING;  Surgeon: Alphonsa Overall, MD;  Location: [facility];  Service: General;  Laterality: N/A;  . PARATHYROIDECTOMY N/A 03/30/2013   Procedure: TOTAL PARATHYROIDECTOMY WITH AUTOTRANSPLANT;  Surgeon: Earnstine Regal, MD;  Location: [facility];  Service: General;  Laterality: N/A;  Autotransplant to left lower arm.  Marland Kitchen TOTAL KNEE ARTHROPLASTY Right 03/27/2014   Procedure: UNICOMPARTMENTAL ARTHROPLASTY;  Surgeon: Meredith Pel, MD;   Location: [facility];  Service: Orthopedics;  Laterality: Right;       Home Medications    Prior to Admission medications   Medication Sig Start Date End Date Taking? Authorizing Provider  albuterol (PROVENTIL HFA;VENTOLIN HFA) 108 (90 Base) MCG/ACT inhaler Inhale 2 puffs into the lungs every 4 (four) hours as needed for wheezing or shortness of breath. 05/14/16   Lysbeth Penner, [name]  calcium carbonate (TUMS - DOSED IN MG ELEMENTAL CALCIUM) 500 MG chewable tablet Chew 2 tablets by mouth 2 (two) times daily as needed for indigestion or heartburn.     Historical Provider, MD  diazepam (VALIUM) 10 MG tablet Take 1 tablet (10 mg total) by mouth every 6 (six) hours as needed for anxiety. 10/01/15   Charlett Blake, MD  docusate sodium (COLACE) 100 MG capsule Take 1 capsule (100 mg total) by mouth every 12 (twelve) hours. 02/19/16   Comer Locket, [name]  glycopyrrolate (ROBINUL) 2 MG tablet Take 1 tablet (2 mg total) by mouth 2 (two) times daily. 01/25/15   Inda Castle, MD  Hyoscyamine Sulfate 0.375 MG TBCR Take 1 tablet twice daily 01/22/15   Inda Castle, MD  lidocaine (XYLOCAINE) 2 % jelly Apply 1 application topically as needed. Patient taking differently: Apply 1 application topically daily as needed (pain).  02/19/16   Comer Locket, [name]  methocarbamol (ROBAXIN) 500 MG tablet Take 1 tablet (500 mg total) by mouth every 8 (eight) hours as needed for muscle spasms. 06/15/16   Theodis Blaze, MD  multivitamin (RENA-VIT) TABS tablet Take 1 tablet by mouth daily.    Historical Provider, MD  omeprazole (PRILOSEC) 20 MG capsule Take 1 capsule (20 mg total) by mouth daily. 07/27/16   Ezequiel Essex, MD  ondansetron (ZOFRAN) 4 MG tablet Take 1 tablet (4 mg total) by mouth every 6 (six) hours. 07/27/16   Ezequiel Essex, MD  predniSONE (DELTASONE) 10 MG tablet Take 2 tablets (20 mg total) by mouth daily. 05/14/16   Lysbeth Penner, [name]  promethazine (PHENERGAN) 25 MG suppository Place 1  suppository (25 mg total) rectally every 6 (six) hours as needed. 07/29/16   Duffy Bruce, MD  ranitidine (ZANTAC) 150 MG tablet Take 1 tablet (150 mg total) by mouth 2 (two) times daily. 07/29/16 08/05/16  Duffy Bruce, MD  sevelamer carbonate (RENVELA) 2.4 G PACK Take 2.4 g by mouth 3 (three) times daily with meals.     Historical Provider, MD  traMADol (ULTRAM) 50 MG tablet Take 1 tablet (50 mg total) by mouth every 8 (eight) hours as needed. Patient taking differently: Take 50 mg by mouth every 8 (eight) hours as needed for moderate pain.  06/15/16   Theodis Blaze, MD  VOLTAREN 1 % GEL Apply 4 g topically 4 (four) times daily. 02/04/16   April Palumbo, MD    Family History Family History  Problem Relation Age of Onset  . Hypertension Mother   . Diabetes Father   . Kidney Stones Sister   . Diabetes Maternal Grandmother   .  Liver cancer Maternal Uncle     Social History Social History  Substance Use Topics  . Smoking status: Former Smoker    Packs/day: 0.20    Years: 13.00    Types: Cigarettes    Quit date: 12/17/2014  . Smokeless tobacco: Never Used  . Alcohol use No     Allergies   Patient has no known allergies.   Review of Systems Review of Systems  Constitutional: Positive for fatigue. Negative for chills and fever.  HENT: Negative for congestion and rhinorrhea.   Eyes: Negative for visual disturbance.  Respiratory: Negative for cough, shortness of breath and wheezing.   Cardiovascular: Negative for chest pain and leg swelling.  Gastrointestinal: Positive for abdominal pain, nausea and vomiting. Negative for diarrhea.  Genitourinary: Negative for dysuria and flank pain.  Musculoskeletal: Negative for neck pain and neck stiffness.  Skin: Negative for rash and wound.  Allergic/Immunologic: Negative for immunocompromised state.  Neurological: Negative for syncope, weakness and headaches.  All other systems reviewed and are negative.    Physical Exam Updated  Vital Signs BP 108/68   Pulse 90   Temp 98.1 F (36.7 C) (Oral)   Resp 18   SpO2 98%   Physical Exam  Constitutional: He is oriented to person, place, and time. He appears well-developed and well-nourished. No distress.  HENT:  Head: Normocephalic and atraumatic.  Mildly dry mucous membranes  Eyes: Conjunctivae are normal.  Neck: Neck supple.  Cardiovascular: Normal rate, regular rhythm and normal heart sounds.  Exam reveals no friction rub.   No murmur heard. Pulmonary/Chest: Effort normal and breath sounds normal. No respiratory distress. He has no wheezes. He has no rales.  Abdominal: Soft. Bowel sounds are normal. He exhibits no distension. There is tenderness (Mild, epigastric, improves with distraction). There is no rebound and no guarding.  Musculoskeletal: He exhibits no edema.  Neurological: He is alert and oriented to person, place, and time. He exhibits normal muscle tone.  Skin: Skin is warm. Capillary refill takes less than 2 seconds.  Psychiatric: He has a normal mood and affect.  Nursing note and vitals reviewed.    ED Treatments / Results  Labs (all labs ordered are listed, but only abnormal results are displayed) Labs Reviewed  COMPREHENSIVE METABOLIC PANEL - Abnormal; Notable for the following:       Result Value   Chloride 94 (*)    CO2 19 (*)    BUN 51 (*)    Creatinine, Ser 12.07 (*)    Calcium 8.4 (*)    Total Protein 8.4 (*)    GFR calc non Af Amer 5 (*)    GFR calc Af Amer 6 (*)    Anion gap 23 (*)    All other components within normal limits  CBC - Abnormal; Notable for the following:    WBC 12.7 (*)    All other components within normal limits  I-STAT CG4 LACTIC ACID, ED - Abnormal; Notable for the following:    Lactic Acid, Venous 2.33 (*)    All other components within normal limits  I-STAT CHEM 8, ED - Abnormal; Notable for the following:    Chloride 98 (*)    BUN 55 (*)    Creatinine, Ser 13.30 (*)    Calcium, Ion 0.82 (*)    All  other components within normal limits  LIPASE, BLOOD  I-STAT CG4 LACTIC ACID, ED  I-STAT CG4 LACTIC ACID, ED    Radiology No results found.  Procedures Procedures (  including critical care time)  Medications Ordered in ED Medications  ondansetron (ZOFRAN-ODT) disintegrating tablet 4 mg (4 mg Oral Given 07/28/16 1729)  metoCLOPramide (REGLAN) injection 10 mg (10 mg Intravenous Given 07/28/16 2129)  diphenhydrAMINE (BENADRYL) injection 25 mg (25 mg Intravenous Given 07/28/16 2129)  HYDROmorphone (DILAUDID) injection 1 mg (1 mg Intravenous Given 07/28/16 2129)  sodium chloride 0.9 % bolus 250 mL (0 mLs Intravenous Stopped 07/29/16 0015)  oxyCODONE-acetaminophen (PERCOCET/ROXICET) 5-325 MG per tablet 2 tablet (2 tablets Oral Given 07/29/16 0033)  ondansetron (ZOFRAN) tablet 4 mg (4 mg Oral Given 07/29/16 0034)     Initial Impression / Assessment and Plan / ED Course  I have reviewed the triage vital signs and the nursing notes.  Pertinent labs & imaging results that were available during my care of the patient were reviewed by me and considered in my medical decision making (see chart for details).  Clinical Course     30 yo M with extensive PMHx including ESRD, chronic abdominal pain, here with n/v and epigastric pain. Pt seen 12/24 for similar sx, had largely unremarkable w/u, with CT showing possible gastric thickening versus gastroparesis. He was d/c but has not filled meds. On arrival, VSS and at baseline. Exam as above. Primary suspicion is ongoing gastritis vs GERD vs gastroparesis, with persistent sx 2/2 not taking meds. No fevers, abdomen is overall soft and he has no evidence to suggest perforation, peritonitis, or obstruction. Will treat supportively, montior in ED.  Initial labs as above. Pt has baseline CKD with chronic low CO2 and AG, and his labs are c/w being due for dialysis tomorrow, with possible component of mild dehydration. Initial LA elevated at 2.33. Discussed  labs with pt and discussed possible admission; however, he is feeling much improved and would like to attempt PO. 250 cc NS given. Will PO challenge.  Pt remains feeling much improved with stable vitals, well appearance, and reassuring abd exams. Repeat chem and LA show resolution of LA and CO2 now at baseline. Discussed outpt management versus inpt obs with pt and he would like to return home. Feel this is reasonable and he has tolerating PO solids and liquids here. WIll d/c with continued care, strict return precautions. Dialysis tomorrow.  Final Clinical Impressions(s) / ED Diagnoses   Final diagnoses:  Gastroparesis  Non-intractable vomiting with nausea, unspecified vomiting type    New Prescriptions Discharge Medication List as of 07/29/2016 12:31 AM    START taking these medications   Details  promethazine (PHENERGAN) 25 MG suppository Place 1 suppository (25 mg total) rectally every 6 (six) hours as needed., Starting Wed 07/29/2016, Print    ranitidine (ZANTAC) 150 MG tablet Take 1 tablet (150 mg total) by mouth 2 (two) times daily., Starting Wed 07/29/2016, Until Wed 08/05/2016, Print         Duffy Bruce, MD 07/29/16 [id number]

## 2016-07-28 NOTE — ED Triage Notes (Signed)
Pt presents for reevaluation of abd pain and N/V x 2 days. Pt. Seen 12/24 for same. Pt reports has not taken any prescribed medications since they were closed for the holidays. Pt reports generalized weakness and dizziness. Pt AxO x4.

## 2016-07-29 DIAGNOSIS — K3184 Gastroparesis: Secondary | ICD-10-CM | POA: Diagnosis not present

## 2016-07-29 LAB — I-STAT CHEM 8, ED
BUN: 55 mg/dL — AB (ref 6–20)
Calcium, Ion: 0.82 mmol/L — CL (ref 1.15–1.40)
Chloride: 98 mmol/L — ABNORMAL LOW (ref 101–111)
Creatinine, Ser: 13.3 mg/dL — ABNORMAL HIGH (ref 0.61–1.24)
Glucose, Bld: 96 mg/dL (ref 65–99)
HEMATOCRIT: 39 % (ref 39.0–52.0)
HEMOGLOBIN: 13.3 g/dL (ref 13.0–17.0)
POTASSIUM: 4.2 mmol/L (ref 3.5–5.1)
SODIUM: 135 mmol/L (ref 135–145)
TCO2: 23 mmol/L (ref 0–100)

## 2016-07-29 LAB — I-STAT CG4 LACTIC ACID, ED: LACTIC ACID, VENOUS: 1.1 mmol/L (ref 0.5–1.9)

## 2016-07-29 MED ORDER — RANITIDINE HCL 150 MG PO TABS: 150.0000 mg | ORAL_TABLET | Freq: Two times a day (BID) | ORAL | 0 refills | Status: DC

## 2016-07-29 MED ORDER — OXYCODONE-ACETAMINOPHEN 5-325 MG PO TABS
2.0000 | ORAL_TABLET | Freq: Once | ORAL | Status: AC
  Administered 2016-07-29: 2 via ORAL
  Filled 2016-07-29: qty 2

## 2016-07-29 MED ORDER — PROMETHAZINE HCL 25 MG RE SUPP: 25.0000 mg | Freq: Four times a day (QID) | RECTAL | 0 refills | Status: DC | PRN

## 2016-07-29 MED ORDER — ONDANSETRON HCL 4 MG PO TABS
4.0000 mg | ORAL_TABLET | Freq: Once | ORAL | Status: AC
  Administered 2016-07-29: 4 mg via ORAL
  Filled 2016-07-29: qty 1

## 2016-08-06 ENCOUNTER — Encounter: Payer: Self-pay | Admitting: Gastroenterology

## 2016-08-06 ENCOUNTER — Ambulatory Visit (INDEPENDENT_AMBULATORY_CARE_PROVIDER_SITE_OTHER): Payer: Medicare Other | Admitting: Gastroenterology

## 2016-08-06 VITALS — BP 88/54 | HR 80 | Ht 66.0 in | Wt 320.1 lb

## 2016-08-06 DIAGNOSIS — N186 End stage renal disease: Secondary | ICD-10-CM | POA: Diagnosis not present

## 2016-08-06 DIAGNOSIS — Z992 Dependence on renal dialysis: Secondary | ICD-10-CM

## 2016-08-06 DIAGNOSIS — R112 Nausea with vomiting, unspecified: Secondary | ICD-10-CM

## 2016-08-06 DIAGNOSIS — K8020 Calculus of gallbladder without cholecystitis without obstruction: Secondary | ICD-10-CM | POA: Diagnosis not present

## 2016-08-06 DIAGNOSIS — R1013 Epigastric pain: Secondary | ICD-10-CM | POA: Diagnosis not present

## 2016-08-06 MED ORDER — METOCLOPRAMIDE HCL 5 MG PO TABS: 5.0000 mg | ORAL_TABLET | Freq: Two times a day (BID) | ORAL | 2 refills | Status: DC

## 2016-08-06 NOTE — Patient Instructions (Signed)
If you are age 32 or older, your body mass index should be between 23-30. Your Body mass index is 51.67 kg/m. If this is out of the aforementioned range listed, please consider follow up with your Primary Care Provider.  If you are age 40 or younger, your body mass index should be between 19-25. Your Body mass index is 51.67 kg/m. If this is out of the aformentioned range listed, please consider follow up with your Primary Care Provider.   We have sent the following medications to your pharmacy for you to pick up at your convenience: Reglan   Please follow up iin 2-3 months.  Thank you for choosing [facility]  Dr Wilfrid Lund III

## 2016-08-06 NOTE — Progress Notes (Signed)
[facility] Progress Note  Chief Complaint: Episodic nausea and vomiting  Subjective  History:  This is a 31 year old man seen by Dr. Deatra Ina once about 18 months ago. At that time he had recurrent episodes of epigastric pain with nausea and vomiting after placement of a lap band for obesity. Upper endoscopy by his surgeon Dr. Lucia Gaskins showed some fundic gland polyps and expected restriction from the band, but it sounds like soon afterwards the band was removed. The patient had persistent symptoms and Dr. Deatra Ina send him for a gastric emptying study in June 2016 that was normal. Of note, the patient had a normal gastric emptying study in 2008. This patient has had end-stage renal disease due to hypertension and is been on dialysis since age 69. [name] was sent to see me because he has had 6 trips to the ED this past year for acute episodes of epigastric pain with protracted nausea and vomiting. One episode in late November And in the hospital for a week. CT scan suggested some thickening of the distal stomach. No GI consultation was obtained, he was discharged with a short supply of metoclopramide. I reviewed the ED visits as well as the history and physical and discharge summary from his recent hospital stay. CT scan images were personally reviewed. Since the 2 ED visits on 1224 and 1226, [name] has been feeling better on metoclopramide 10 mg 3 times a day. He has not followed any particular dietary recommendations. He has not noticed any clear correlation between his dialysis and the onset of symptoms. He is adamant that he does not miss his 3 time weekly dialysis sessions. Despite that, his creatinine usually runs at least 5, but on the 2 recent ED visits it was over 12.  ROS: Cardiovascular:  no chest pain Respiratory: no dyspnea  The patient's Past Medical, Family and Social History were reviewed and are on file in the EMR.  Objective:  Med list reviewed  Vital signs in last 24 hrs: Vitals:    08/06/16 1129  BP: (!) 88/54  Pulse: 80    Physical Exam    HEENT: sclera anicteric, oral mucosa moist without lesions  Neck: supple, no thyromegaly, JVD or lymphadenopathy  Cardiac: RRR without murmurs, S1S2 heard, no peripheral edema  Pulm: clear to auscultation bilaterally, normal RR and effort noted  Abdomen: soft, Morbidly obese, no tenderness, with active bowel sounds. No guarding or palpable hepatosplenomegaly.  Skin; warm and dry, no jaundice or rash  Recent Labs: CMP Latest Ref Rng & Units 07/29/2016 07/28/2016 07/26/2016  Glucose 65 - 99 mg/dL 96 92 136(H)  BUN 6 - 20 mg/dL 55(H) 51(H) 22(H)  Creatinine 0.61 - 1.24 mg/dL 13.30(H) 12.07(H) 5.72(H)  Sodium 135 - 145 mmol/L 135 136 134(L)  Potassium 3.5 - 5.1 mmol/L 4.2 4.4 3.6  Chloride 101 - 111 mmol/L 98(L) 94(L) 89(L)  CO2 22 - 32 mmol/L - 19(L) 26  Calcium 8.9 - 10.3 mg/dL - 8.4(L) 8.9  Total Protein 6.5 - 8.1 g/dL - 8.4(H) 9.5(H)  Total Bilirubin 0.3 - 1.2 mg/dL - 0.7 0.9  Alkaline Phos 38 - 126 U/L - 60 60  AST 15 - 41 U/L - 19 29  ALT 17 - 63 U/L - 25 32      Radiologic studies:  CT abdomen and pelvis as noted above. Incidental gallstones were discovered, surgical consultation did not feel this was likely the cause of symptoms.  @ASSESSMENTPLANBEGIN @ Assessment: Encounter Diagnoses  Name Primary?  . Nausea and vomiting,  intractability of vomiting not specified, unspecified vomiting type Yes  . CKD (chronic kidney disease) stage V requiring chronic dialysis ([place])   . Abdominal pain, epigastric   . Gallstones    He appears to have a cyclic vomiting syndrome that I think is likely related to his underlying chronic kidney disease and dialysis rather than true gastroparesis. It is unknown whether his gastric emptying studies were done on or off metoclopramide, possibly affecting the results.  At any rate, he is apparently improved on regular doses of metoclopramide. I would like to keep him on this  for the next 2 months to see if it will control his symptoms and need for visits to the ED. If so, then it will probably be evidence that he has either intermittent gastroparesis or something like a gastric dysrhythmia causing the same symptom complex. He is aware of the risk of tardive dyskinesia after I explained to him and demonstrated what it might look like. He knows that if it occurs, he should stop the medicine call me. Nevertheless, I think the risk of short-term use is low, and the benefit may be great if it controls his symptoms to keep him out of the hospital. I decreased the dose to 5 mg due to his renal failure, and dose to twice a day since he typically only eats lunch and supper.   Plan:  Reglan as dosed above, follow up with me in 2 months or sooner as needed I will have him follow a gastroparesis diet to see if it is helpful as well.   Total time 30  minutes, over half spent in  extensive record review, counseling and coordination of care.   [name]

## 2016-08-12 ENCOUNTER — Encounter (HOSPITAL_COMMUNITY): Payer: Self-pay

## 2016-08-12 DIAGNOSIS — R1084 Generalized abdominal pain: Secondary | ICD-10-CM

## 2016-08-12 DIAGNOSIS — I120 Hypertensive chronic kidney disease with stage 5 chronic kidney disease or end stage renal disease: Secondary | ICD-10-CM

## 2016-08-12 DIAGNOSIS — Z87891 Personal history of nicotine dependence: Secondary | ICD-10-CM

## 2016-08-12 DIAGNOSIS — N186 End stage renal disease: Secondary | ICD-10-CM | POA: Insufficient documentation

## 2016-08-12 DIAGNOSIS — Z992 Dependence on renal dialysis: Secondary | ICD-10-CM | POA: Insufficient documentation

## 2016-08-12 DIAGNOSIS — R112 Nausea with vomiting, unspecified: Secondary | ICD-10-CM | POA: Diagnosis not present

## 2016-08-12 DIAGNOSIS — Z96651 Presence of right artificial knee joint: Secondary | ICD-10-CM

## 2016-08-12 DIAGNOSIS — K2980 Duodenitis without bleeding: Secondary | ICD-10-CM | POA: Diagnosis not present

## 2016-08-12 LAB — CBC
HCT: 44.7 % (ref 39.0–52.0)
Hemoglobin: 15.7 g/dL (ref 13.0–17.0)
MCH: 32 pg (ref 26.0–34.0)
MCHC: 35.1 g/dL (ref 30.0–36.0)
MCV: 91 fL (ref 78.0–100.0)
PLATELETS: 213 10*3/uL (ref 150–400)
RBC: 4.91 MIL/uL (ref 4.22–5.81)
RDW: 13.8 % (ref 11.5–15.5)
WBC: 14.4 10*3/uL — AB (ref 4.0–10.5)

## 2016-08-12 LAB — COMPREHENSIVE METABOLIC PANEL WITH GFR
ALT: 21 U/L (ref 17–63)
AST: 21 U/L (ref 15–41)
Albumin: 4.9 g/dL (ref 3.5–5.0)
Alkaline Phosphatase: 64 U/L (ref 38–126)
Anion gap: 23 — ABNORMAL HIGH (ref 5–15)
BUN: 26 mg/dL — ABNORMAL HIGH (ref 6–20)
CO2: 20 mmol/L — ABNORMAL LOW (ref 22–32)
Calcium: 9.5 mg/dL (ref 8.9–10.3)
Chloride: 93 mmol/L — ABNORMAL LOW (ref 101–111)
Creatinine, Ser: 8.11 mg/dL — ABNORMAL HIGH (ref 0.61–1.24)
GFR calc Af Amer: 9 mL/min — ABNORMAL LOW (ref >60)
GFR calc non Af Amer: 8 mL/min — ABNORMAL LOW (ref >60)
Glucose, Bld: 111 mg/dL — ABNORMAL HIGH (ref 65–99)
Potassium: 3.1 mmol/L — ABNORMAL LOW (ref 3.5–5.1)
Sodium: 136 mmol/L (ref 135–145)
Total Bilirubin: 1.1 mg/dL (ref 0.3–1.2)
Total Protein: 10 g/dL — ABNORMAL HIGH (ref 6.5–8.1)

## 2016-08-12 LAB — LIPASE, BLOOD: Lipase: 30 U/L (ref 11–51)

## 2016-08-12 MED ORDER — ONDANSETRON 4 MG PO TBDP
ORAL_TABLET | ORAL | Status: AC
  Filled 2016-08-12: qty 1

## 2016-08-12 MED ORDER — ONDANSETRON 4 MG PO TBDP
4.0000 mg | ORAL_TABLET | Freq: Once | ORAL | Status: AC | PRN
  Administered 2016-08-12: 4 mg via ORAL

## 2016-08-12 NOTE — ED Triage Notes (Signed)
Pt states that he had a sudden onset on upper central abd pain today around 6pm with n/v, denies diarrhea. Pt is a dialysis pt, goes MWF, last received today.

## 2016-08-13 ENCOUNTER — Encounter (HOSPITAL_COMMUNITY): Payer: Self-pay | Admitting: Emergency Medicine

## 2016-08-13 ENCOUNTER — Emergency Department (HOSPITAL_COMMUNITY): Payer: Medicare Other

## 2016-08-13 ENCOUNTER — Inpatient Hospital Stay (HOSPITAL_COMMUNITY)
Admission: EM | Admit: 2016-08-13 | Discharge: 2016-08-16 | DRG: 391 | Disposition: A | Discharge status: Home / Self Care | Payer: Medicare Other | Attending: Internal Medicine | Admitting: Internal Medicine

## 2016-08-13 ENCOUNTER — Emergency Department (HOSPITAL_COMMUNITY)
Admission: EM | Admit: 2016-08-13 | Discharge: 2016-08-13 | Disposition: A | Discharge status: Home / Self Care | Payer: Medicare Other | Source: Home / Self Care | Attending: Emergency Medicine | Admitting: Emergency Medicine

## 2016-08-13 DIAGNOSIS — K219 Gastro-esophageal reflux disease without esophagitis: Secondary | ICD-10-CM | POA: Diagnosis present

## 2016-08-13 DIAGNOSIS — E860 Dehydration: Secondary | ICD-10-CM | POA: Diagnosis present

## 2016-08-13 DIAGNOSIS — Z87891 Personal history of nicotine dependence: Secondary | ICD-10-CM

## 2016-08-13 DIAGNOSIS — Z833 Family history of diabetes mellitus: Secondary | ICD-10-CM

## 2016-08-13 DIAGNOSIS — Z992 Dependence on renal dialysis: Secondary | ICD-10-CM

## 2016-08-13 DIAGNOSIS — K2980 Duodenitis without bleeding: Principal | ICD-10-CM | POA: Diagnosis present

## 2016-08-13 DIAGNOSIS — Z96651 Presence of right artificial knee joint: Secondary | ICD-10-CM | POA: Diagnosis present

## 2016-08-13 DIAGNOSIS — Z79899 Other long term (current) drug therapy: Secondary | ICD-10-CM

## 2016-08-13 DIAGNOSIS — D649 Anemia, unspecified: Secondary | ICD-10-CM | POA: Diagnosis present

## 2016-08-13 DIAGNOSIS — K224 Dyskinesia of esophagus: Secondary | ICD-10-CM | POA: Diagnosis present

## 2016-08-13 DIAGNOSIS — Z6841 Body Mass Index (BMI) 40.0 and over, adult: Secondary | ICD-10-CM

## 2016-08-13 DIAGNOSIS — I10 Essential (primary) hypertension: Secondary | ICD-10-CM | POA: Diagnosis present

## 2016-08-13 DIAGNOSIS — Z8249 Family history of ischemic heart disease and other diseases of the circulatory system: Secondary | ICD-10-CM

## 2016-08-13 DIAGNOSIS — R1013 Epigastric pain: Secondary | ICD-10-CM | POA: Diagnosis present

## 2016-08-13 DIAGNOSIS — K920 Hematemesis: Secondary | ICD-10-CM | POA: Diagnosis present

## 2016-08-13 DIAGNOSIS — I9589 Other hypotension: Secondary | ICD-10-CM | POA: Diagnosis present

## 2016-08-13 DIAGNOSIS — D72829 Elevated white blood cell count, unspecified: Secondary | ICD-10-CM | POA: Diagnosis present

## 2016-08-13 DIAGNOSIS — G8929 Other chronic pain: Secondary | ICD-10-CM | POA: Diagnosis present

## 2016-08-13 DIAGNOSIS — K922 Gastrointestinal hemorrhage, unspecified: Secondary | ICD-10-CM

## 2016-08-13 DIAGNOSIS — M549 Dorsalgia, unspecified: Secondary | ICD-10-CM | POA: Diagnosis present

## 2016-08-13 DIAGNOSIS — E872 Acidosis: Secondary | ICD-10-CM | POA: Diagnosis present

## 2016-08-13 DIAGNOSIS — N186 End stage renal disease: Secondary | ICD-10-CM | POA: Diagnosis present

## 2016-08-13 DIAGNOSIS — N2581 Secondary hyperparathyroidism of renal origin: Secondary | ICD-10-CM | POA: Diagnosis present

## 2016-08-13 DIAGNOSIS — Z885 Allergy status to narcotic agent status: Secondary | ICD-10-CM

## 2016-08-13 DIAGNOSIS — I120 Hypertensive chronic kidney disease with stage 5 chronic kidney disease or end stage renal disease: Secondary | ICD-10-CM | POA: Diagnosis present

## 2016-08-13 DIAGNOSIS — R112 Nausea with vomiting, unspecified: Secondary | ICD-10-CM

## 2016-08-13 DIAGNOSIS — R1084 Generalized abdominal pain: Secondary | ICD-10-CM

## 2016-08-13 DIAGNOSIS — R6510 Systemic inflammatory response syndrome (SIRS) of non-infectious origin without acute organ dysfunction: Secondary | ICD-10-CM | POA: Diagnosis present

## 2016-08-13 DIAGNOSIS — K760 Fatty (change of) liver, not elsewhere classified: Secondary | ICD-10-CM | POA: Diagnosis present

## 2016-08-13 DIAGNOSIS — Z9884 Bariatric surgery status: Secondary | ICD-10-CM

## 2016-08-13 DIAGNOSIS — K2970 Gastritis, unspecified, without bleeding: Secondary | ICD-10-CM | POA: Diagnosis present

## 2016-08-13 LAB — I-STAT CHEM 8, ED
BUN: 44 mg/dL — ABNORMAL HIGH (ref 6–20)
Calcium, Ion: 0.77 mmol/L — CL (ref 1.15–1.40)
Chloride: 98 mmol/L — ABNORMAL LOW (ref 101–111)
Creatinine, Ser: 12.3 mg/dL — ABNORMAL HIGH (ref 0.61–1.24)
GLUCOSE: 107 mg/dL — AB (ref 65–99)
HEMATOCRIT: 41 % (ref 39.0–52.0)
HEMOGLOBIN: 13.9 g/dL (ref 13.0–17.0)
POTASSIUM: 3.4 mmol/L — AB (ref 3.5–5.1)
Sodium: 135 mmol/L (ref 135–145)
TCO2: 24 mmol/L (ref 0–100)

## 2016-08-13 LAB — COMPREHENSIVE METABOLIC PANEL
ALK PHOS: 55 U/L (ref 38–126)
ALT: 18 U/L (ref 17–63)
AST: 15 U/L (ref 15–41)
Albumin: 4.6 g/dL (ref 3.5–5.0)
Anion gap: 23 — ABNORMAL HIGH (ref 5–15)
BILIRUBIN TOTAL: 1.2 mg/dL (ref 0.3–1.2)
BUN: 43 mg/dL — AB (ref 6–20)
CALCIUM: 8.5 mg/dL — AB (ref 8.9–10.3)
CO2: 21 mmol/L — ABNORMAL LOW (ref 22–32)
CREATININE: 11.43 mg/dL — AB (ref 0.61–1.24)
Chloride: 93 mmol/L — ABNORMAL LOW (ref 101–111)
GFR calc Af Amer: 6 mL/min — ABNORMAL LOW (ref >60)
GFR, EST NON AFRICAN AMERICAN: 5 mL/min — AB (ref >60)
Glucose, Bld: 105 mg/dL — ABNORMAL HIGH (ref 65–99)
Potassium: 3.6 mmol/L (ref 3.5–5.1)
Sodium: 137 mmol/L (ref 135–145)
TOTAL PROTEIN: 9 g/dL — AB (ref 6.5–8.1)

## 2016-08-13 LAB — CBC WITH DIFFERENTIAL/PLATELET
BASOS ABS: 0 10*3/uL (ref 0.0–0.1)
BASOS PCT: 0 %
Eosinophils Absolute: 0 10*3/uL (ref 0.0–0.7)
Eosinophils Relative: 0 %
HCT: 40.2 % (ref 39.0–52.0)
Hemoglobin: 14 g/dL (ref 13.0–17.0)
Lymphocytes Relative: 14 %
Lymphs Abs: 1.9 10*3/uL (ref 0.7–4.0)
MCH: 31.5 pg (ref 26.0–34.0)
MCHC: 34.8 g/dL (ref 30.0–36.0)
MCV: 90.5 fL (ref 78.0–100.0)
MONO ABS: 1.8 10*3/uL — AB (ref 0.1–1.0)
Monocytes Relative: 13 %
Neutro Abs: 9.9 10*3/uL — ABNORMAL HIGH (ref 1.7–7.7)
Neutrophils Relative %: 73 %
Platelets: 236 10*3/uL (ref 150–400)
RBC: 4.44 MIL/uL (ref 4.22–5.81)
RDW: 13.7 % (ref 11.5–15.5)
WBC: 13.7 10*3/uL — ABNORMAL HIGH (ref 4.0–10.5)

## 2016-08-13 LAB — I-STAT CG4 LACTIC ACID, ED
LACTIC ACID, VENOUS: 4.87 mmol/L — AB (ref 0.5–1.9)
Lactic Acid, Venous: 2.16 mmol/L (ref 0.5–1.9)

## 2016-08-13 LAB — LIPASE, BLOOD: LIPASE: 51 U/L (ref 11–51)

## 2016-08-13 MED ORDER — HYDROMORPHONE HCL 2 MG/ML IJ SOLN
1.0000 mg | Freq: Once | INTRAMUSCULAR | Status: AC
  Administered 2016-08-13: 1 mg via INTRAVENOUS
  Filled 2016-08-13: qty 1

## 2016-08-13 MED ORDER — SODIUM CHLORIDE 0.9 % IV BOLUS (SEPSIS)
500.0000 mL | Freq: Once | INTRAVENOUS | Status: AC
  Administered 2016-08-13: 500 mL via INTRAVENOUS

## 2016-08-13 MED ORDER — OXYCODONE-ACETAMINOPHEN 5-325 MG PO TABS
1.0000 | ORAL_TABLET | Freq: Once | ORAL | Status: AC
  Administered 2016-08-13: 1 via ORAL
  Filled 2016-08-13: qty 1

## 2016-08-13 MED ORDER — SODIUM CHLORIDE 0.9 % IV BOLUS (SEPSIS)
500.0000 mL | Freq: Once | INTRAVENOUS | Status: AC
Start: 2016-08-13 — End: 2016-08-13
  Administered 2016-08-13: 500 mL via INTRAVENOUS

## 2016-08-13 MED ORDER — DICYCLOMINE HCL 10 MG PO CAPS
10.0000 mg | ORAL_CAPSULE | Freq: Once | ORAL | Status: AC
  Administered 2016-08-13: 10 mg via ORAL
  Filled 2016-08-13: qty 1

## 2016-08-13 MED ORDER — ONDANSETRON HCL 4 MG/2ML IJ SOLN
4.0000 mg | Freq: Once | INTRAMUSCULAR | Status: AC
  Administered 2016-08-13: 4 mg via INTRAVENOUS
  Filled 2016-08-13: qty 2

## 2016-08-13 MED ORDER — METOCLOPRAMIDE HCL 5 MG/ML IJ SOLN
10.0000 mg | Freq: Once | INTRAMUSCULAR | Status: AC
Start: 2016-08-13 — End: 2016-08-13
  Administered 2016-08-13: 10 mg via INTRAVENOUS
  Filled 2016-08-13: qty 2

## 2016-08-13 MED ORDER — PROMETHAZINE HCL 25 MG/ML IJ SOLN
12.5000 mg | Freq: Once | INTRAMUSCULAR | Status: AC
  Administered 2016-08-13: 12.5 mg via INTRAVENOUS
  Filled 2016-08-13: qty 1

## 2016-08-13 MED ORDER — GI COCKTAIL ~~LOC~~
30.0000 mL | Freq: Once | ORAL | Status: AC
  Administered 2016-08-13: 30 mL via ORAL
  Filled 2016-08-13: qty 30

## 2016-08-13 MED ORDER — MORPHINE SULFATE (PF) 4 MG/ML IV SOLN
4.0000 mg | Freq: Once | INTRAVENOUS | Status: AC
Start: 2016-08-13 — End: 2016-08-13
  Administered 2016-08-13: 4 mg via INTRAVENOUS
  Filled 2016-08-13: qty 1

## 2016-08-13 NOTE — ED Notes (Signed)
[facility] notified of low BP upon reassessed vitals.  Acuity changed and priority set for room assignment

## 2016-08-13 NOTE — ED Provider Notes (Signed)
[facility] DEPT Provider Note   CSN: [id number] Arrival date & time: 08/12/16  2256   By signing my name below, I, Julien Nordmann, attest that this documentation has been prepared under the direction and in the presence of Everlene Balls, MD.  Electronically Signed: Julien Nordmann, ED Scribe. 08/13/16. 3:34 AM.   History   Chief Complaint Chief Complaint  Patient presents with  . Abdominal Pain   The history is provided by the patient. No language interpreter was used.   HPI Comments: [name] is a 31 y.o. male who has a PMhx of ESRD, GERD, HTN, hyperparathyroidism, and joint swelling presents to the Emergency Department complaining of gradual worsening, moderate, left upper and central abdominal pain that began ~ 6pm following his full dialysis session. He is a MWF dialysis pt and reports having sudden onset after he got home from his treatment. He reports vomiting (x6) and loss of appetite. Pt has a past surgical abdominal hx of having a lapband in place. Pt is unable to make any urine. Pt has not taken any medication to alleviate his pain without relief. He was seen on 07/18/16 for similar abdominal pain and was diagnosed with gastroparesis. Pt was prescribed phenergan and zantac which did not help with his pain. Pt has no other complaints.  Past Medical History:  Diagnosis Date  . Arthritis    "all over" (06/11/2016)  . Chronic lower back pain   . Complication of anesthesia    A little while to wake up after knee surgery in 2008  . Dizziness    when coming off of dialysis  . ESRD (end stage renal disease) on dialysis [facility])    MWF and goes to [facility] (06/11/2016)  . Family history of anesthesia complication    mom slow to wake up  . GERD (gastroesophageal reflux disease)    takes Omeprazole as needed  . Headache(784.0)    occasionally  . History of hiatal hernia   . Hyperparathyroidism ([place])   . Hypertension   . Joint pain   . Joint swelling   . Morbid obesity  ([place])   . PONV (postoperative nausea and vomiting)   . Renal insufficiency     Patient Active Problem List   Diagnosis Date Noted  . CKD (chronic kidney disease) stage V requiring chronic dialysis ([place]) 06/11/2016  . Nausea & vomiting 06/11/2016  . Hepatic steatosis 06/11/2016  . Cholelithiasis 06/11/2016  . Localized osteoarthritis of left knee 07/11/2015  . Abdominal pain, epigastric 12/05/2014  . Lapband April 2016 11/27/2014  . Arthritis of knee 03/27/2014  . Morbid obesity, weight 291, BMI - 47 02/15/2014  . End stage renal disease ([place]) 07/12/2013  . Hyperparathyroidism, secondary ([place]) 03/14/2013    Past Surgical History:  Procedure Laterality Date  . AV FISTULA PLACEMENT Bilateral 2005,2007   Uses Right arm  . BREATH TEK H PYLORI N/A 05/15/2014   Procedure: BREATH TEK H PYLORI;  Surgeon: Alphonsa Overall, MD;  Location: [facility];  Service: General;  Laterality: N/A;  . ESOPHAGOGASTRODUODENOSCOPY N/A 12/07/2014   Procedure: ESOPHAGOGASTRODUODENOSCOPY (EGD);  Surgeon: Alphonsa Overall, MD;  Location[facility];  Service: General;  Laterality: N/A;  . ESOPHAGOGASTRODUODENOSCOPY N/A 12/17/2014   Procedure: ESOPHAGOGASTRODUODENOSCOPY (EGD);  Surgeon: Alphonsa Overall, MD;  Location: [facility];  Service: General;  Laterality: N/A;  . ESOPHAGOGASTRODUODENOSCOPY ENDOSCOPY    . HERNIA REPAIR  11/2014   w/lap band OR  . JOINT REPLACEMENT    . KNEE ARTHROSCOPY Left 2007  .  LAPAROSCOPIC GASTRIC BANDING N/A 11/12/2014   Procedure: LAPAROSCOPIC GASTRIC BANDING;  Surgeon: Alphonsa Overall, MD;  Location: [facility];  Service: General;  Laterality: N/A;  . PARATHYROIDECTOMY N/A 03/30/2013   Procedure: TOTAL PARATHYROIDECTOMY WITH AUTOTRANSPLANT;  Surgeon: Earnstine Regal, MD;  Location: [facility];  Service: General;  Laterality: N/A;  Autotransplant to left lower arm.  Marland Kitchen TOTAL KNEE ARTHROPLASTY Right 03/27/2014   Procedure: UNICOMPARTMENTAL ARTHROPLASTY;  Surgeon: Meredith Pel, MD;  Location: [facility];   Service: Orthopedics;  Laterality: Right;       Home Medications    Prior to Admission medications   Medication Sig Start Date End Date Taking? Authorizing Provider  albuterol (PROVENTIL HFA;VENTOLIN HFA) 108 (90 Base) MCG/ACT inhaler Inhale 2 puffs into the lungs every 4 (four) hours as needed for wheezing or shortness of breath. 05/14/16   Lysbeth Penner, [name]  calcium carbonate (TUMS - DOSED IN MG ELEMENTAL CALCIUM) 500 MG chewable tablet Chew 2 tablets by mouth 2 (two) times daily as needed for indigestion or heartburn.     Historical Provider, MD  docusate sodium (COLACE) 100 MG capsule Take 1 capsule (100 mg total) by mouth every 12 (twelve) hours. 02/19/16   Comer Locket, [name]  glycopyrrolate (ROBINUL) 2 MG tablet Take 1 tablet (2 mg total) by mouth 2 (two) times daily. 01/25/15   Inda Castle, MD  Hyoscyamine Sulfate 0.375 MG TBCR Take 1 tablet twice daily 01/22/15   Inda Castle, MD  lidocaine (XYLOCAINE) 2 % jelly Apply 1 application topically as needed. Patient taking differently: Apply 1 application topically daily as needed (pain).  02/19/16   Comer Locket, [name]  methocarbamol (ROBAXIN) 500 MG tablet Take 1 tablet (500 mg total) by mouth every 8 (eight) hours as needed for muscle spasms. 06/15/16   Theodis Blaze, MD  metoCLOPramide (REGLAN) 5 MG tablet Take 1 tablet (5 mg total) by mouth 2 (two) times daily before a meal. 08/06/16   Nelida Meuse III, MD  multivitamin (RENA-VIT) TABS tablet Take 1 tablet by mouth daily.    Historical Provider, MD  omeprazole (PRILOSEC) 20 MG capsule Take 1 capsule (20 mg total) by mouth daily. 07/27/16   Ezequiel Essex, MD  ondansetron (ZOFRAN) 4 MG tablet Take 1 tablet (4 mg total) by mouth every 6 (six) hours. 07/27/16   Ezequiel Essex, MD  promethazine (PHENERGAN) 25 MG suppository Place 1 suppository (25 mg total) rectally every 6 (six) hours as needed. 07/29/16   Duffy Bruce, MD  sevelamer carbonate (RENVELA) 2.4 G PACK Take 2.4  g by mouth 3 (three) times daily with meals.     Historical Provider, MD  traMADol (ULTRAM) 50 MG tablet Take 1 tablet (50 mg total) by mouth every 8 (eight) hours as needed. Patient taking differently: Take 50 mg by mouth as needed for moderate pain.  06/15/16   Theodis Blaze, MD  VOLTAREN 1 % GEL Apply 4 g topically 4 (four) times daily. Patient taking differently: Apply 4 g topically as needed.  02/04/16   April Palumbo, MD    Family History Family History  Problem Relation Age of Onset  . Hypertension Mother   . Diabetes Father   . Kidney Stones Sister   . Diabetes Maternal Grandmother   . Liver cancer Maternal Uncle     Social History Social History  Substance Use Topics  . Smoking status: Former Smoker    Packs/day: 0.20    Years: 13.00    Types: Cigarettes  Quit date: 12/17/2014  . Smokeless tobacco: Never Used  . Alcohol use No     Allergies   Dilaudid [hydromorphone hcl]   Review of Systems Review of Systems  A complete 10 system review of systems was obtained and all systems are negative except as noted in the HPI and PMH.   Physical Exam Updated Vital Signs BP 116/64   Pulse 92   Temp 98.2 F (36.8 C) (Oral)   Resp 18   Ht 5\' 6"  (1.676 m)   Wt (!) 310 lb (140.6 kg)   SpO2 100%   BMI 50.04 kg/m   Physical Exam  Constitutional: He is oriented to person, place, and time. Vital signs are normal. He appears well-developed and well-nourished.  Non-toxic appearance. He does not appear ill. No distress.  HENT:  Head: Normocephalic and atraumatic.  Nose: Nose normal.  Mouth/Throat: Oropharynx is clear and moist. No oropharyngeal exudate.  Eyes: Conjunctivae and EOM are normal. Pupils are equal, round, and reactive to light. No scleral icterus.  Neck: Normal range of motion. Neck supple. No tracheal deviation, no edema, no erythema and normal range of motion present. No thyroid mass and no thyromegaly present.  Cardiovascular: Normal rate, regular rhythm,  S1 normal, S2 normal, normal heart sounds, intact distal pulses and normal pulses.  Exam reveals no gallop and no friction rub.   No murmur heard. Pulmonary/Chest: Effort normal and breath sounds normal. No respiratory distress. He has no wheezes. He has no rhonchi. He has no rales.  Abdominal: Soft. Normal appearance and bowel sounds are normal. He exhibits no distension, no ascites and no mass. There is no hepatosplenomegaly. There is no tenderness. There is no rebound, no guarding and no CVA tenderness.  Musculoskeletal: Normal range of motion. He exhibits no edema or tenderness.  Left AV fistula with palpable thrill  Lymphadenopathy:    He has no cervical adenopathy.  Neurological: He is alert and oriented to person, place, and time. He has normal strength. No cranial nerve deficit or sensory deficit.  Skin: Skin is warm, dry and intact. No petechiae and no rash noted. He is not diaphoretic. No erythema. No pallor.  Nursing note and vitals reviewed.    ED Treatments / Results  DIAGNOSTIC STUDIES: Oxygen Saturation is 98% on RA, normal by my interpretation.  COORDINATION OF CARE:  3:33 AM Discussed treatment plan with pt at bedside and pt agreed to plan.  Labs (all labs ordered are listed, but only abnormal results are displayed) Labs Reviewed  COMPREHENSIVE METABOLIC PANEL - Abnormal; Notable for the following:       Result Value   Potassium 3.1 (*)    Chloride 93 (*)    CO2 20 (*)    Glucose, Bld 111 (*)    BUN 26 (*)    Creatinine, Ser 8.11 (*)    Total Protein 10.0 (*)    GFR calc non Af Amer 8 (*)    GFR calc Af Amer 9 (*)    Anion gap 23 (*)    All other components within normal limits  CBC - Abnormal; Notable for the following:    WBC 14.4 (*)    All other components within normal limits  I-STAT CG4 LACTIC ACID, ED - Abnormal; Notable for the following:    Lactic Acid, Venous 4.87 (*)    All other components within normal limits  LIPASE, BLOOD    EKG  EKG  Interpretation None       Radiology No  results found.  Procedures Procedures (including critical care time)  Medications Ordered in ED Medications  ondansetron (ZOFRAN-ODT) disintegrating tablet 4 mg (4 mg Oral Given 08/12/16 2321)  HYDROmorphone (DILAUDID) injection 1 mg (1 mg Intravenous Given 08/13/16 0339)  metoCLOPramide (REGLAN) injection 10 mg (10 mg Intravenous Given 08/13/16 0339)  sodium chloride 0.9 % bolus 500 mL (0 mLs Intravenous Stopped 08/13/16 0558)     Initial Impression / Assessment and Plan / ED Course  I have reviewed the triage vital signs and the nursing notes.  Pertinent labs & imaging results that were available during my care of the patient were reviewed by me and considered in my medical decision making (see chart for details).  Clinical Course    Patient presents to the ED for abdominal pain.  He has been seen here in the past multiple times for this and had a CT scan done 2 [place] ago that did not show acute pathology.  He states this is the same pain again.  Dilaudid and reglan work for him so this was given.  He dropped his O2 sat to 60% on RA.  This came up with NRB.  Will continue to observe.  Lactate elevated, suggesting patient may of had too much fluid removed causing his abdominal pain.  In addition he is getting cramping in his legs.  He was given IVF bolus and this helped relieve his symptoms.  He states his leg pain is gone and his abdomen feels better.  Plan to DC with PCP fu within 3 days.  He is now 100% on RA.  He appears well and in NAD. Patient safe for DC.    Final Clinical Impressions(s) / ED Diagnoses   Final diagnoses:  None    I personally performed the services described in this documentation, which was scribed in my presence. The recorded information has been reviewed and is accurate.     New Prescriptions New Prescriptions   No medications on file     Everlene Balls, MD 08/13/16 [phone]

## 2016-08-13 NOTE — ED Notes (Signed)
Pt alarm going off, came to room and pt sleeping with oxygen in 60's placed on 4 liters [place] and stimulated and sats came up to mid 70's Dr Claudine Mouton called and primary nurse aware. Pt placed on NRB and repositioned in bed and placed on cardiac monitor and sats increased to upper 90's to 100 will continue to monitor pt.

## 2016-08-13 NOTE — ED Notes (Signed)
Pt. Spo2 dropped to 68% after receiving Dilaudid , pt. Placed on a NRB mask 10 lpm , O2 sat=100% , pt. alert and oriented but somnolent , denies pain / respirations unlabored .

## 2016-08-13 NOTE — ED Provider Notes (Signed)
[place] DEPT Provider Note   CSN: [id number] Arrival date & time: 08/13/16  1310     History   Chief Complaint Chief Complaint  Patient presents with  . Abdominal Pain  . Emesis    HPI [name] is a 31 y.o. male.  [name] is a 31 y.o. Male with a history of ESRD on dialysis who presents to the ED complaining of abdominal pain since yesterday. Patient reports generalized abdominal pain with associated nausea and vomiting. He reports 3 episodes of vomiting up "black liquid" today. He denies history of GI bleeds. He has had a previous lap band placed that has been taken down. He denies history of GI bleed. He reports his last bowel movement was 3 days ago and was normal. No diarrhea. He does not make any urine. Last dialysis was yesterday. He is on a MWF schedule. He denies NSAID use. He is not on anticoagulants. He denies fevers, diarrhea, chest pain, shortness of breath, syncope, or rashes.    The history is provided by the patient and medical records. No language interpreter was used.    Past Medical History:  Diagnosis Date  . Arthritis    "all over" (06/11/2016)  . Chronic lower back pain   . Complication of anesthesia    A little while to wake up after knee surgery in 2008  . Dizziness    when coming off of dialysis  . ESRD (end stage renal disease) on dialysis [facility])    MWF and goes to [facility] (06/11/2016)  . Family history of anesthesia complication    mom slow to wake up  . GERD (gastroesophageal reflux disease)    takes Omeprazole as needed  . Headache(784.0)    occasionally  . History of hiatal hernia   . Hyperparathyroidism ([place])   . Hypertension   . Joint pain   . Joint swelling   . Morbid obesity ([place])   . PONV (postoperative nausea and vomiting)   . Renal insufficiency     Patient Active Problem List   Diagnosis Date Noted  . Hematemesis 08/14/2016  . GERD (gastroesophageal reflux disease) 08/14/2016  . Essential hypertension  08/14/2016  . GIB (gastrointestinal bleeding) 08/14/2016  . SIRS (systemic inflammatory response syndrome) ([place]) 08/14/2016  . CKD (chronic kidney disease) stage V requiring chronic dialysis ([place]) 06/11/2016  . Nausea & vomiting 06/11/2016  . Hepatic steatosis 06/11/2016  . Cholelithiasis 06/11/2016  . Localized osteoarthritis of left knee 07/11/2015  . Abdominal pain, epigastric 12/05/2014  . Lapband April 2016 11/27/2014  . Arthritis of knee 03/27/2014  . Morbid obesity, weight 291, BMI - 47 02/15/2014  . ESRD on dialysis ([place]) 07/12/2013  . Hyperparathyroidism, secondary ([place]) 03/14/2013    Past Surgical History:  Procedure Laterality Date  . AV FISTULA PLACEMENT Bilateral 2005,2007   Uses Right arm  . BREATH TEK H PYLORI N/A 05/15/2014   Procedure: BREATH TEK H PYLORI;  Surgeon: Alphonsa Overall, MD;  Location: [facility];  Service: General;  Laterality: N/A;  . ESOPHAGOGASTRODUODENOSCOPY N/A 12/07/2014   Procedure: ESOPHAGOGASTRODUODENOSCOPY (EGD);  Surgeon: Alphonsa Overall, MD;  [facility];  Service: General;  Laterality: N/A;  . ESOPHAGOGASTRODUODENOSCOPY N/A 12/17/2014   Procedure: ESOPHAGOGASTRODUODENOSCOPY (EGD);  Surgeon: Alphonsa Overall, MD;  Location: [place];  Service: General;  Laterality: N/A;  . ESOPHAGOGASTRODUODENOSCOPY ENDOSCOPY    . HERNIA REPAIR  11/2014   w/lap band OR  . JOINT REPLACEMENT    . KNEE ARTHROSCOPY Left 2007  .  LAPAROSCOPIC GASTRIC BANDING N/A 11/12/2014   Procedure: LAPAROSCOPIC GASTRIC BANDING;  Surgeon: Alphonsa Overall, MD;  Location: [facility];  Service: General;  Laterality: N/A;  . PARATHYROIDECTOMY N/A 03/30/2013   Procedure: TOTAL PARATHYROIDECTOMY WITH AUTOTRANSPLANT;  Surgeon: Earnstine Regal, MD;  Location: [place];  Service: General;  Laterality: N/A;  Autotransplant to left lower arm.  Marland Kitchen TOTAL KNEE ARTHROPLASTY Right 03/27/2014   Procedure: UNICOMPARTMENTAL ARTHROPLASTY;  Surgeon: Meredith Pel, MD;  Location: [place];  Service: Orthopedics;   Laterality: Right;       Home Medications    Prior to Admission medications   Medication Sig Start Date End Date Taking? Authorizing Provider  albuterol (PROVENTIL HFA;VENTOLIN HFA) 108 (90 Base) MCG/ACT inhaler Inhale 2 puffs into the lungs every 4 (four) hours as needed for wheezing or shortness of breath. 05/14/16  Yes Lysbeth Penner, [name]  calcium carbonate (TUMS - DOSED IN MG ELEMENTAL CALCIUM) 500 MG chewable tablet Chew 2 tablets by mouth 2 (two) times daily as needed for indigestion or heartburn.    Yes Historical Provider, MD  docusate sodium (COLACE) 100 MG capsule Take 1 capsule (100 mg total) by mouth every 12 (twelve) hours. 02/19/16  Yes Benjamin Cartner, [name]  glycopyrrolate (ROBINUL) 2 MG tablet Take 1 tablet (2 mg total) by mouth 2 (two) times daily. 01/25/15  Yes Inda Castle, MD  Hyoscyamine Sulfate 0.375 MG TBCR Take 1 tablet twice daily 01/22/15  Yes Inda Castle, MD  methocarbamol (ROBAXIN) 500 MG tablet Take 1 tablet (500 mg total) by mouth every 8 (eight) hours as needed for muscle spasms. 06/15/16  Yes Theodis Blaze, MD  metoCLOPramide (REGLAN) 5 MG tablet Take 1 tablet (5 mg total) by mouth 2 (two) times daily before a meal. 08/06/16  Yes Nelida Meuse III, MD  multivitamin (RENA-VIT) TABS tablet Take 1 tablet by mouth daily.   Yes Historical Provider, MD  omeprazole (PRILOSEC) 20 MG capsule Take 1 capsule (20 mg total) by mouth daily. 07/27/16  Yes Ezequiel Essex, MD  ondansetron (ZOFRAN) 4 MG tablet Take 1 tablet (4 mg total) by mouth every 6 (six) hours. 07/27/16  Yes Ezequiel Essex, MD  sevelamer carbonate (RENVELA) 2.4 G PACK Take 2.4 g by mouth 3 (three) times daily with meals.    Yes Historical Provider, MD  traMADol (ULTRAM) 50 MG tablet Take 1 tablet (50 mg total) by mouth every 8 (eight) hours as needed. Patient taking differently: Take 50 mg by mouth every 6 (six) hours as needed (for pain).  06/15/16  Yes Theodis Blaze, MD  VOLTAREN 1 % GEL Apply 4 g  topically 4 (four) times daily. Patient taking differently: Apply 4 g topically as needed.  02/04/16  Yes April Palumbo, MD  lidocaine (XYLOCAINE) 2 % jelly Apply 1 application topically as needed. Patient taking differently: Apply 1 application topically daily as needed (pain).  02/19/16   Comer Locket, [name]  promethazine (PHENERGAN) 25 MG suppository Place 1 suppository (25 mg total) rectally every 6 (six) hours as needed. 07/29/16   Duffy Bruce, MD    Family History Family History  Problem Relation Age of Onset  . Hypertension Mother   . Diabetes Father   . Kidney Stones Sister   . Diabetes Maternal Grandmother   . Liver cancer Maternal Uncle     Social History Social History  Substance Use Topics  . Smoking status: Former Smoker    Packs/day: 0.20    Years: 13.00  Types: Cigarettes    Quit date: 12/17/2014  . Smokeless tobacco: Never Used  . Alcohol use No     Allergies   Dilaudid [hydromorphone hcl]   Review of Systems Review of Systems  Constitutional: Negative for chills and fever.  HENT: Negative for congestion and sore throat.   Eyes: Negative for visual disturbance.  Respiratory: Negative for cough and shortness of breath.   Cardiovascular: Negative for chest pain.  Gastrointestinal: Positive for abdominal pain, nausea and vomiting. Negative for diarrhea.  Genitourinary:       Does not make urine.   Musculoskeletal: Negative for back pain and neck pain.  Skin: Negative for rash.  Neurological: Positive for light-headedness. Negative for syncope and headaches.     Physical Exam Updated Vital Signs BP (!) 92/47   Pulse 88   Temp 97.6 F (36.4 C) (Oral)   Resp 14   Ht 5\' 6"  (1.676 m)   Wt (!) 140.6 kg   SpO2 97%   BMI 50.04 kg/m   Physical Exam  Constitutional: He appears well-developed and well-nourished. No distress.  Obese male. Nontoxic appearing.  HENT:  Head: Normocephalic and atraumatic.  Mouth/Throat: Oropharynx is clear and  moist.  Mucous membranes are moist.  Eyes: Conjunctivae are normal. Pupils are equal, round, and reactive to light. Right eye exhibits no discharge. Left eye exhibits no discharge.  Neck: Neck supple.  Cardiovascular: Normal rate, regular rhythm, normal heart sounds and intact distal pulses.   Pulmonary/Chest: Effort normal and breath sounds normal. No respiratory distress. He has no wheezes. He has no rales.  Abdominal: Soft. Bowel sounds are normal. He exhibits no distension and no mass. There is tenderness. There is no rebound and no guarding.  Abdomen is soft. Bowel sounds are present. Patient has generalized abdominal tenderness to palpation. No focal areas of tenderness.  Musculoskeletal: He exhibits no edema or tenderness.  Lymphadenopathy:    He has no cervical adenopathy.  Neurological: He is alert. Coordination normal.  Skin: Skin is warm and dry. Capillary refill takes less than 2 seconds. No rash noted. He is not diaphoretic. No erythema. No pallor.  Psychiatric: He has a normal mood and affect. His behavior is normal.  Nursing note and vitals reviewed.    ED Treatments / Results  Labs (all labs ordered are listed, but only abnormal results are displayed) Labs Reviewed  COMPREHENSIVE METABOLIC PANEL - Abnormal; Notable for the following:       Result Value   Chloride 93 (*)    CO2 21 (*)    Glucose, Bld 105 (*)    BUN 43 (*)    Creatinine, Ser 11.43 (*)    Calcium 8.5 (*)    Total Protein 9.0 (*)    GFR calc non Af Amer 5 (*)    GFR calc Af Amer 6 (*)    Anion gap 23 (*)    All other components within normal limits  CBC WITH DIFFERENTIAL/PLATELET - Abnormal; Notable for the following:    WBC 13.7 (*)    Neutro Abs 9.9 (*)    Monocytes Absolute 1.8 (*)    All other components within normal limits  APTT - Abnormal; Notable for the following:    aPTT 39 (*)    All other components within normal limits  CBC - Abnormal; Notable for the following:    WBC 12.9 (*)     All other components within normal limits  I-STAT CHEM 8, ED - Abnormal; Notable for the  following:    Potassium 3.4 (*)    Chloride 98 (*)    BUN 44 (*)    Creatinine, Ser 12.30 (*)    Glucose, Bld 107 (*)    Calcium, Ion 0.77 (*)    All other components within normal limits  I-STAT CG4 LACTIC ACID, ED - Abnormal; Notable for the following:    Lactic Acid, Venous 2.16 (*)    All other components within normal limits  CULTURE, BLOOD (ROUTINE X 2)  CULTURE, BLOOD (ROUTINE X 2)  LIPASE, BLOOD  PROTIME-INR  PROCALCITONIN  BASIC METABOLIC PANEL  CBC  CBC  CBC  LACTIC ACID, PLASMA  LACTIC ACID, PLASMA  URINALYSIS, ROUTINE W REFLEX MICROSCOPIC  TYPE AND SCREEN    EKG  EKG Interpretation None       Radiology Ct Abdomen Pelvis Wo Contrast  Result Date: 08/13/2016 CLINICAL DATA:  Generalized abdomen pain, nausea vomiting. EXAM: CT ABDOMEN AND PELVIS WITHOUT CONTRAST TECHNIQUE: Multidetector CT imaging of the abdomen and pelvis was performed following the standard protocol without IV contrast. COMPARISON:  July 27, 2016 FINDINGS: Lower chest: No acute abnormality. Hepatobiliary: No focal liver abnormality is seen. No gallstones, gallbladder wall thickening, or biliary dilatation. Pancreas: Unremarkable. No pancreatic ductal dilatation or surrounding inflammatory changes. Spleen: Normal in size without focal abnormality. Adrenals/Urinary Tract: The adrenal glands are normal. The bilateral kidneys are atrophic. There multiple bilateral kidney cysts. There is no hydronephrosis bilaterally. Nonobstructing right kidney stones are noted. The bladder is decompressed limiting evaluation. Stomach/Bowel: There is a small hiatal hernia. Appendix appears normal. No evidence of bowel wall thickening, distention, or inflammatory changes. Vascular/Lymphatic: No significant vascular findings are present. No enlarged abdominal or pelvic lymph nodes. Reproductive: Prostate is unremarkable. Other: No  abdominal wall hernia or abnormality. No abdominopelvic ascites. Musculoskeletal: No acute or significant osseous findings. IMPRESSION: No acute abnormality.  No bowel obstruction. Bilateral atrophic kidneys with kidney cysts. Electronically Signed   By: Abelardo Diesel M.D.   On: 08/13/2016 18:53    Procedures Procedures (including critical care time)  Medications Ordered in ED Medications  sodium chloride flush (NS) 0.9 % injection 3 mL (not administered)  acetaminophen (TYLENOL) tablet 650 mg (not administered)    Or  acetaminophen (TYLENOL) suppository 650 mg (not administered)  ondansetron (ZOFRAN) tablet 4 mg (not administered)    Or  ondansetron (ZOFRAN) injection 4 mg (not administered)  zolpidem (AMBIEN) tablet 5 mg (not administered)  metoCLOPramide (REGLAN) tablet 5 mg (not administered)  methocarbamol (ROBAXIN) tablet 500 mg (not administered)  albuterol (PROVENTIL) (2.5 MG/3ML) 0.083% nebulizer solution 2.5 mg (not administered)  docusate sodium (COLACE) capsule 100 mg (not administered)  lidocaine (XYLOCAINE) 2 % jelly 1 application (not administered)  multivitamin (RENA-VIT) tablet 1 tablet (not administered)  calcium carbonate (TUMS - dosed in mg elemental calcium) chewable tablet 400 mg of elemental calcium (not administered)  sevelamer carbonate (RENVELA) powder PACK 2.4 g (not administered)  pantoprazole (PROTONIX) 80 mg in sodium chloride 0.9 % 100 mL IVPB (not administered)  pantoprazole (PROTONIX) 80 mg in sodium chloride 0.9 % 250 mL (0.32 mg/mL) infusion (not administered)  0.9 %  sodium chloride infusion (not administered)  sodium chloride 0.9 % bolus 500 mL (0 mLs Intravenous Stopped 08/13/16 2134)  ondansetron (ZOFRAN) injection 4 mg (4 mg Intravenous Given 08/13/16 2025)  morphine 4 MG/ML injection 4 mg (4 mg Intravenous Given 08/13/16 2026)  sodium chloride 0.9 % bolus 500 mL (0 mLs Intravenous Stopped 08/13/16 2134)  dicyclomine (BENTYL) capsule  10 mg (10 mg  Oral Given 08/13/16 2157)  oxyCODONE-acetaminophen (PERCOCET/ROXICET) 5-325 MG per tablet 1 tablet (1 tablet Oral Given 08/13/16 2157)  promethazine (PHENERGAN) injection 12.5 mg (12.5 mg Intravenous Given 08/13/16 2157)  gi cocktail (Maalox,Lidocaine,Donnatal) (30 mLs Oral Given 08/13/16 2319)     Initial Impression / Assessment and Plan / ED Course  I have reviewed the triage vital signs and the nursing notes.  Pertinent labs & imaging results that were available during my care of the patient were reviewed by me and considered in my medical decision making (see chart for details).  Clinical Course    This is a 31 y.o. Male with a history of ESRD on dialysis who presents to the ED complaining of abdominal pain since yesterday. Patient reports generalized abdominal pain with associated nausea and vomiting. He reports 3 episodes of vomiting up "black liquid" today. He denies history of GI bleeds. He has had a previous lap band placed that has been taken down. He denies history of GI bleed. He reports his last bowel movement was 3 days ago and was normal. No diarrhea. He does not make any urine. Last dialysis was yesterday. He is on a MWF schedule. On exam patient is afebrile nontoxic appearing. He has some generalized abdominal tenderness to palpation without focal tenderness. No vomiting seen during ER visit. CBC is remote for white count of 12,900. Hemoglobin is 14.0 and was 15.7 yesterday. So more than a 1.5 g drop. Anion gap is 23. Lipase is within normal limits. Lactic acid is 2.16. Patient did feel better after fluid bolus, Zofran and GI cocktail. He is able tolerate by mouth. Repeat blood pressure is, however are lower than normal for the patient. 70/58 manually to his wrist. Patient has blood pressures 88 systolic. I feel this is too low for the patient to go home tonight. Will admit for observation. Feel the patient is stable and responded well to treatment. He is not tachycardic and denies  feeling lightheaded. He's had no further vomiting in the emergency department. No witnessed hematemesis.   I consulted with Dr. Blaine Hamper who accepted the patient for admission and requested step down temp orders.   This patient was discussed with Dr. Dayna Barker who agrees with assessment and plan.   Final Clinical Impressions(s) / ED Diagnoses   Final diagnoses:  Non-intractable vomiting with nausea, unspecified vomiting type  Generalized abdominal pain    New Prescriptions New Prescriptions   No medications on file     Waynetta Pean, [name] 08/14/16 0145    Merrily Pew, MD 08/17/16 3112

## 2016-08-13 NOTE — ED Triage Notes (Signed)
Pt c/o vomiting black liquid-- was here yesterday with abd pain and vomiting, but emesis yesterday was not black per pt. Dialysis M,W,F-- went yesterday.  Last BM Monday or Tuesday per pt.

## 2016-08-14 ENCOUNTER — Encounter (HOSPITAL_COMMUNITY): Payer: Self-pay | Admitting: *Deleted

## 2016-08-14 DIAGNOSIS — N2581 Secondary hyperparathyroidism of renal origin: Secondary | ICD-10-CM | POA: Diagnosis present

## 2016-08-14 DIAGNOSIS — I10 Essential (primary) hypertension: Secondary | ICD-10-CM | POA: Diagnosis present

## 2016-08-14 DIAGNOSIS — Z9884 Bariatric surgery status: Secondary | ICD-10-CM | POA: Diagnosis not present

## 2016-08-14 DIAGNOSIS — Z992 Dependence on renal dialysis: Secondary | ICD-10-CM | POA: Diagnosis not present

## 2016-08-14 DIAGNOSIS — I9589 Other hypotension: Secondary | ICD-10-CM | POA: Diagnosis present

## 2016-08-14 DIAGNOSIS — K219 Gastro-esophageal reflux disease without esophagitis: Secondary | ICD-10-CM | POA: Diagnosis present

## 2016-08-14 DIAGNOSIS — N186 End stage renal disease: Secondary | ICD-10-CM

## 2016-08-14 DIAGNOSIS — E860 Dehydration: Secondary | ICD-10-CM | POA: Diagnosis present

## 2016-08-14 DIAGNOSIS — R6510 Systemic inflammatory response syndrome (SIRS) of non-infectious origin without acute organ dysfunction: Secondary | ICD-10-CM | POA: Diagnosis present

## 2016-08-14 DIAGNOSIS — I120 Hypertensive chronic kidney disease with stage 5 chronic kidney disease or end stage renal disease: Secondary | ICD-10-CM | POA: Diagnosis present

## 2016-08-14 DIAGNOSIS — R112 Nausea with vomiting, unspecified: Secondary | ICD-10-CM | POA: Diagnosis present

## 2016-08-14 DIAGNOSIS — K920 Hematemesis: Secondary | ICD-10-CM | POA: Diagnosis present

## 2016-08-14 DIAGNOSIS — D649 Anemia, unspecified: Secondary | ICD-10-CM | POA: Diagnosis present

## 2016-08-14 DIAGNOSIS — R1013 Epigastric pain: Secondary | ICD-10-CM

## 2016-08-14 DIAGNOSIS — D72829 Elevated white blood cell count, unspecified: Secondary | ICD-10-CM | POA: Diagnosis present

## 2016-08-14 DIAGNOSIS — K2970 Gastritis, unspecified, without bleeding: Secondary | ICD-10-CM | POA: Diagnosis present

## 2016-08-14 DIAGNOSIS — M549 Dorsalgia, unspecified: Secondary | ICD-10-CM | POA: Diagnosis present

## 2016-08-14 DIAGNOSIS — Z96651 Presence of right artificial knee joint: Secondary | ICD-10-CM | POA: Diagnosis present

## 2016-08-14 DIAGNOSIS — G8929 Other chronic pain: Secondary | ICD-10-CM | POA: Diagnosis present

## 2016-08-14 DIAGNOSIS — K224 Dyskinesia of esophagus: Secondary | ICD-10-CM | POA: Diagnosis present

## 2016-08-14 DIAGNOSIS — Z8249 Family history of ischemic heart disease and other diseases of the circulatory system: Secondary | ICD-10-CM | POA: Diagnosis not present

## 2016-08-14 DIAGNOSIS — K760 Fatty (change of) liver, not elsewhere classified: Secondary | ICD-10-CM | POA: Diagnosis present

## 2016-08-14 DIAGNOSIS — G43A0 Cyclical vomiting, not intractable: Secondary | ICD-10-CM

## 2016-08-14 DIAGNOSIS — K2980 Duodenitis without bleeding: Secondary | ICD-10-CM | POA: Diagnosis present

## 2016-08-14 DIAGNOSIS — Z6841 Body Mass Index (BMI) 40.0 and over, adult: Secondary | ICD-10-CM | POA: Diagnosis not present

## 2016-08-14 DIAGNOSIS — E872 Acidosis: Secondary | ICD-10-CM | POA: Diagnosis present

## 2016-08-14 DIAGNOSIS — K922 Gastrointestinal hemorrhage, unspecified: Secondary | ICD-10-CM | POA: Diagnosis present

## 2016-08-14 DIAGNOSIS — Z87891 Personal history of nicotine dependence: Secondary | ICD-10-CM | POA: Diagnosis not present

## 2016-08-14 LAB — TYPE AND SCREEN
ABO/RH(D): O NEG
Antibody Screen: NEGATIVE

## 2016-08-14 LAB — BASIC METABOLIC PANEL
ANION GAP: 20 — AB (ref 5–15)
BUN: 51 mg/dL — AB (ref 6–20)
CO2: 21 mmol/L — AB (ref 22–32)
Calcium: 7.6 mg/dL — ABNORMAL LOW (ref 8.9–10.3)
Chloride: 96 mmol/L — ABNORMAL LOW (ref 101–111)
Creatinine, Ser: 12.93 mg/dL — ABNORMAL HIGH (ref 0.61–1.24)
GFR calc Af Amer: 5 mL/min — ABNORMAL LOW (ref >60)
GFR, EST NON AFRICAN AMERICAN: 4 mL/min — AB (ref >60)
GLUCOSE: 94 mg/dL (ref 65–99)
POTASSIUM: 3.8 mmol/L (ref 3.5–5.1)
Sodium: 137 mmol/L (ref 135–145)

## 2016-08-14 LAB — CBC
HCT: 35.9 % — ABNORMAL LOW (ref 39.0–52.0)
HEMATOCRIT: 39.3 % (ref 39.0–52.0)
HEMOGLOBIN: 13.9 g/dL (ref 13.0–17.0)
Hemoglobin: 12.2 g/dL — ABNORMAL LOW (ref 13.0–17.0)
MCH: 31.3 pg (ref 26.0–34.0)
MCH: 32.1 pg (ref 26.0–34.0)
MCHC: 34 g/dL (ref 30.0–36.0)
MCHC: 35.4 g/dL (ref 30.0–36.0)
MCV: 90.8 fL (ref 78.0–100.0)
MCV: 92.1 fL (ref 78.0–100.0)
PLATELETS: 180 10*3/uL (ref 150–400)
Platelets: 211 10*3/uL (ref 150–400)
RBC: 3.9 MIL/uL — AB (ref 4.22–5.81)
RBC: 4.33 MIL/uL (ref 4.22–5.81)
RDW: 13.7 % (ref 11.5–15.5)
RDW: 13.9 % (ref 11.5–15.5)
WBC: 12.4 10*3/uL — ABNORMAL HIGH (ref 4.0–10.5)
WBC: 12.9 10*3/uL — ABNORMAL HIGH (ref 4.0–10.5)

## 2016-08-14 LAB — LACTIC ACID, PLASMA
Lactic Acid, Venous: 1.2 mmol/L (ref 0.5–1.9)
Lactic Acid, Venous: 1 mmol/L (ref 0.5–1.9)

## 2016-08-14 LAB — PROCALCITONIN: PROCALCITONIN: 0.94 ng/mL

## 2016-08-14 LAB — MRSA PCR SCREENING: MRSA by PCR: NEGATIVE

## 2016-08-14 LAB — PROTIME-INR
INR: 1.12
PROTHROMBIN TIME: 14.5 s (ref 11.4–15.2)

## 2016-08-14 LAB — GLUCOSE, CAPILLARY: Glucose-Capillary: 105 mg/dL — ABNORMAL HIGH (ref 65–99)

## 2016-08-14 LAB — APTT: aPTT: 39 seconds — ABNORMAL HIGH (ref 24–36)

## 2016-08-14 MED ORDER — PENTAFLUOROPROP-TETRAFLUOROETH EX AERO: 1.0000 "application " | INHALATION_SPRAY | CUTANEOUS | Status: DC | PRN

## 2016-08-14 MED ORDER — ONDANSETRON HCL 4 MG PO TABS
4.0000 mg | ORAL_TABLET | Freq: Four times a day (QID) | ORAL | Status: DC | PRN
Start: 2016-08-14 — End: 2016-08-14

## 2016-08-14 MED ORDER — SODIUM CHLORIDE 0.9% FLUSH
3.0000 mL | Freq: Two times a day (BID) | INTRAVENOUS | Status: DC
  Administered 2016-08-14 – 2016-08-15 (×4): 3 mL via INTRAVENOUS

## 2016-08-14 MED ORDER — ZOLPIDEM TARTRATE 5 MG PO TABS: 5.0000 mg | ORAL_TABLET | Freq: Every evening | ORAL | Status: DC | PRN

## 2016-08-14 MED ORDER — CALCIUM CARBONATE ANTACID 500 MG PO CHEW
2.0000 | CHEWABLE_TABLET | Freq: Two times a day (BID) | ORAL | Status: DC | PRN
  Filled 2016-08-14: qty 2

## 2016-08-14 MED ORDER — HEPARIN SODIUM (PORCINE) 1000 UNIT/ML DIALYSIS: 1000.0000 [IU] | INTRAMUSCULAR | Status: DC | PRN

## 2016-08-14 MED ORDER — SODIUM CHLORIDE 0.9 % IV SOLN
INTRAVENOUS | Status: DC
  Administered 2016-08-14: 02:00:00 via INTRAVENOUS

## 2016-08-14 MED ORDER — PANTOPRAZOLE SODIUM 40 MG IV SOLR
40.0000 mg | INTRAVENOUS | Status: DC
Start: 2016-08-14 — End: 2016-08-16
  Administered 2016-08-14 – 2016-08-15 (×2): 40 mg via INTRAVENOUS
  Filled 2016-08-14 (×2): qty 40

## 2016-08-14 MED ORDER — HYDROCORTISONE NA SUCCINATE PF 100 MG IJ SOLR
100.0000 mg | Freq: Three times a day (TID) | INTRAMUSCULAR | Status: DC
  Administered 2016-08-14 – 2016-08-15 (×3): 100 mg via INTRAVENOUS
  Filled 2016-08-14 (×3): qty 2

## 2016-08-14 MED ORDER — SODIUM CHLORIDE 0.9 % IV SOLN
80.0000 mg | Freq: Once | INTRAVENOUS | Status: AC
  Administered 2016-08-14: 80 mg via INTRAVENOUS
  Filled 2016-08-14: qty 80

## 2016-08-14 MED ORDER — SODIUM CHLORIDE 0.9 % IV SOLN: INTRAVENOUS | Status: DC

## 2016-08-14 MED ORDER — LIDOCAINE-PRILOCAINE 2.5-2.5 % EX CREA: 1.0000 "application " | TOPICAL_CREAM | CUTANEOUS | Status: DC | PRN

## 2016-08-14 MED ORDER — ONDANSETRON HCL 4 MG/2ML IJ SOLN: 4.0000 mg | Freq: Four times a day (QID) | INTRAMUSCULAR | Status: DC | PRN

## 2016-08-14 MED ORDER — SODIUM CHLORIDE 0.9 % IV SOLN: 100.0000 mL | INTRAVENOUS | Status: DC | PRN

## 2016-08-14 MED ORDER — ONDANSETRON HCL 4 MG/2ML IJ SOLN
4.0000 mg | Freq: Four times a day (QID) | INTRAMUSCULAR | Status: DC
  Administered 2016-08-14 – 2016-08-16 (×9): 4 mg via INTRAVENOUS
  Filled 2016-08-14 (×9): qty 2

## 2016-08-14 MED ORDER — SODIUM CHLORIDE 0.9 % IV BOLUS (SEPSIS)
1000.0000 mL | Freq: Once | INTRAVENOUS | Status: AC
  Administered 2016-08-14: 1000 mL via INTRAVENOUS

## 2016-08-14 MED ORDER — CALCITRIOL 0.5 MCG PO CAPS
0.5000 ug | ORAL_CAPSULE | ORAL | Status: DC
  Administered 2016-08-14: 0.5 ug via ORAL
  Filled 2016-08-14: qty 1

## 2016-08-14 MED ORDER — METOCLOPRAMIDE HCL 5 MG/ML IJ SOLN
5.0000 mg | Freq: Four times a day (QID) | INTRAMUSCULAR | Status: DC
  Administered 2016-08-14 – 2016-08-16 (×9): 5 mg via INTRAVENOUS
  Filled 2016-08-14 (×9): qty 2

## 2016-08-14 MED ORDER — SODIUM CHLORIDE 0.9 % IV BOLUS (SEPSIS): 1000.0000 mL | Freq: Once | INTRAVENOUS | Status: DC

## 2016-08-14 MED ORDER — DOCUSATE SODIUM 100 MG PO CAPS
100.0000 mg | ORAL_CAPSULE | Freq: Two times a day (BID) | ORAL | Status: DC
  Administered 2016-08-14 – 2016-08-15 (×3): 100 mg via ORAL
  Filled 2016-08-14 (×3): qty 1

## 2016-08-14 MED ORDER — SODIUM CHLORIDE 0.9 % IV BOLUS (SEPSIS)
250.0000 mL | Freq: Once | INTRAVENOUS | Status: AC
  Administered 2016-08-14: 250 mL via INTRAVENOUS

## 2016-08-14 MED ORDER — SODIUM CHLORIDE 0.9 % IV BOLUS (SEPSIS): 500.0000 mL | Freq: Once | INTRAVENOUS | Status: DC

## 2016-08-14 MED ORDER — SODIUM CHLORIDE 0.9 % IV SOLN
8.0000 mg/h | INTRAVENOUS | Status: DC
  Administered 2016-08-14: 8 mg/h via INTRAVENOUS
  Filled 2016-08-14 (×3): qty 80

## 2016-08-14 MED ORDER — METHOCARBAMOL 500 MG PO TABS
500.0000 mg | ORAL_TABLET | Freq: Three times a day (TID) | ORAL | Status: DC | PRN
  Administered 2016-08-14: 500 mg via ORAL
  Filled 2016-08-14: qty 1

## 2016-08-14 MED ORDER — SEVELAMER CARBONATE 2.4 G PO PACK
2.4000 g | PACK | Freq: Three times a day (TID) | ORAL | Status: DC
  Administered 2016-08-14 – 2016-08-16 (×5): 2.4 g via ORAL
  Filled 2016-08-14 (×3): qty 1

## 2016-08-14 MED ORDER — ACETAMINOPHEN 650 MG RE SUPP: 650.0000 mg | Freq: Four times a day (QID) | RECTAL | Status: DC | PRN

## 2016-08-14 MED ORDER — LIDOCAINE HCL 2 % EX GEL
1.0000 "application " | Freq: Every day | CUTANEOUS | Status: DC | PRN
  Filled 2016-08-14: qty 5

## 2016-08-14 MED ORDER — LIDOCAINE HCL (PF) 1 % IJ SOLN: 5.0000 mL | INTRAMUSCULAR | Status: DC | PRN

## 2016-08-14 MED ORDER — METOCLOPRAMIDE HCL 5 MG PO TABS
5.0000 mg | ORAL_TABLET | Freq: Two times a day (BID) | ORAL | Status: DC
  Administered 2016-08-14: 5 mg via ORAL
  Filled 2016-08-14: qty 1

## 2016-08-14 MED ORDER — PANTOPRAZOLE SODIUM 40 MG IV SOLR: 40.0000 mg | INTRAVENOUS | Status: DC

## 2016-08-14 MED ORDER — ACETAMINOPHEN 325 MG PO TABS: 650.0000 mg | ORAL_TABLET | Freq: Four times a day (QID) | ORAL | Status: DC | PRN

## 2016-08-14 MED ORDER — RENA-VITE PO TABS
1.0000 | ORAL_TABLET | Freq: Every day | ORAL | Status: DC
  Administered 2016-08-14 – 2016-08-15 (×2): 1 via ORAL
  Filled 2016-08-14 (×2): qty 1

## 2016-08-14 MED ORDER — ALBUTEROL SULFATE (2.5 MG/3ML) 0.083% IN NEBU
2.5000 mg | INHALATION_SOLUTION | RESPIRATORY_TRACT | Status: DC | PRN
Start: 2016-08-14 — End: 2016-08-16

## 2016-08-14 NOTE — Progress Notes (Addendum)
Patient seen and examined, although blood pressure is low, he is asymptomatic, he declined midodrine. Currently no active n/v, GI consulted, I have discussed with case with Dr Hilarie Fredrickson in person.  Nephrology also consulted for HD support.

## 2016-08-14 NOTE — Consult Note (Signed)
[facility] Consult: 12:03 PM 08/14/2016  LOS: 1 day    Referring Provider: Dr Erlinda Hong Primary Care Physician:  Philis Fendt, MD Primary Gastroenterologist:  Dr. Loletha Carrow    Reason for Consultation:  Nausea, vomiting   HPI: [name] is a 31 y.o. male.  PMH  ESRD.  On HD MWF since ~ 2004.  HTN.  Morbid obesity.  Chronic joint pain. Chronic hypotension.   Normal GES in 2008.    05/2014 H Pylori Breath Tek: negative.   11/2014 S/p lap band and repair of HH. Lap band removed 12/2014 due to abd pain, n/v.  Abdominal epigastric pain with n/v intermittently since then.   12/2014 EGD.  Dr Lucia Gaskins: fundic gland polyps expected restriction from the band 01/2015 GES: Normal study.  Ingested meal empties the stomach gradually over the course of the study with 40% retention at 60 min and 4% retention at 120 min (normal retention less than 30% at a 120 min). 03/2015 UGI series: Mild to moderate gastroesophageal reflux. Mild esophageal dysmotility. Thickened folds in proximal to mid duodenum, possibly representing duodenitis.  Frequent trips to ED for acute episodes of epigastric pain with protracted nausea and vomiting. One episode in late November landed him in the hospital for a week. CT scan suggested some thickening of the distal stomach. No GI consultation was obtained, he was discharged with a short supply of metoclopramide. Ultrasound with uncomplicated gallstones, fatty liver.  HIDA scan: patent cystic and CBD.  GB EF 39%, lower limits normal but unchanged from 2007.     More ED visits after 06/2016 inpt stay.  Was feeling better on Metoclopramide 10 mg TID.  Does not equate timing of sxs with HD sessions and adamant he does not miss HD or shorten HD sessions.  However creatinine as high as 12 during 07/2016 visits to ED.     08/06/14 OV with Dr Loletha Carrow.  He feels pt has cyclic vomiting syndrome..  related to his underlying chronic kidney disease and dialysis rather than true gastroparesis. Since sxs better with Metoclopramide, MD continued this but dropped dose to 5 mg BID, with meals, due to his CKD and potential adverse effects.  Continued on Prilosec 20 mg daily. Pt was given outline for gastroparesis diet.   Presented to ED yesterday with recurrent acute epigastric pain which radiates into his back and n/v refractory to home meds. Vomit included CGE which arose after many hours of nausea and vomiting. He says that up until 2 days ago, his stomach and nausea vomiting problems were actually pretty well controlled. He had been using the Reglan with meals only which was anywhere from 1-3 times daily. He wasn't having significant abdominal pain. Bowel movements occurring about every day to every other day. Once the symptoms started to flare he tried taking anti-emetics, I think this was Zofran, but it was ineffective. Reglan was also ineffective.  Yesterday he underwent repeat CT abdomen pelvis without contrast which is unrevealing.   LFTs and lipase are normal. Hgb 15.7 .Marland Kitchen 14.. 13.9.. 12.2.. In last 3  days.  WBCs as high as 14.4 two days ago. He has been compliant with hemodialysis, in fact he is about to go off to hemodialysis right now.  For pain management, he uses Robaxin. Not taking NSAIDs or narcotics. Since he was admitted, his last nausea vomiting occurred about 24 hours ago. Epigastric pain persists.      Past Medical History:  Diagnosis Date  . Arthritis    "all over" (06/11/2016)  . Chronic lower back pain   . Complication of anesthesia    A little while to wake up after knee surgery in 2008  . Dizziness    when coming off of dialysis  . ESRD (end stage renal disease) on dialysis [facility])    MWF and goes to [facility] (06/11/2016)  . Family history of anesthesia complication    mom slow to wake up  . GERD  (gastroesophageal reflux disease)    takes Omeprazole as needed  . Headache(784.0)    occasionally  . History of hiatal hernia   . Hyperparathyroidism ([place])   . Hypertension   . Joint pain   . Joint swelling   . Morbid obesity ([place])   . PONV (postoperative nausea and vomiting)   . Renal insufficiency     Past Surgical History:  Procedure Laterality Date  . AV FISTULA PLACEMENT Bilateral 2005,2007   Uses Right arm  . BREATH TEK H PYLORI N/A 05/15/2014   Procedure: BREATH TEK H PYLORI;  Surgeon: Alphonsa Overall, MD;  Location: [facility];  Service: General;  Laterality: N/A;  . ESOPHAGOGASTRODUODENOSCOPY N/A 12/07/2014   Procedure: ESOPHAGOGASTRODUODENOSCOPY (EGD);  Surgeon: Alphonsa Overall, MD;  Location: [facility] ENDOSCOPY;  Service: General;  Laterality: N/A;  . ESOPHAGOGASTRODUODENOSCOPY N/A 12/17/2014   Procedure: ESOPHAGOGASTRODUODENOSCOPY (EGD);  Surgeon: Alphonsa Overall, MD;  Location: [facility];  Service: General;  Laterality: N/A;  . ESOPHAGOGASTRODUODENOSCOPY ENDOSCOPY    . HERNIA REPAIR  11/2014   w/lap band OR  . JOINT REPLACEMENT    . KNEE ARTHROSCOPY Left 2007  . LAPAROSCOPIC GASTRIC BANDING N/A 11/12/2014   Procedure: LAPAROSCOPIC GASTRIC BANDING;  Surgeon: Alphonsa Overall, MD;  Location: [facility];  Service: General;  Laterality: N/A;  . PARATHYROIDECTOMY N/A 03/30/2013   Procedure: TOTAL PARATHYROIDECTOMY WITH AUTOTRANSPLANT;  Surgeon: Earnstine Regal, MD;  Location: [facility];  Service: General;  Laterality: N/A;  Autotransplant to left lower arm.  Marland Kitchen TOTAL KNEE ARTHROPLASTY Right 03/27/2014   Procedure: UNICOMPARTMENTAL ARTHROPLASTY;  Surgeon: Meredith Pel, MD;  Location: [facility];  Service: Orthopedics;  Laterality: Right;    Prior to Admission medications   Medication Sig Start Date End Date Taking? Authorizing Provider  albuterol (PROVENTIL HFA;VENTOLIN HFA) 108 (90 Base) MCG/ACT inhaler Inhale 2 puffs into the lungs every 4 (four) hours as needed for wheezing or shortness of breath.  05/14/16  Yes Lysbeth Penner, [name]  calcium carbonate (TUMS - DOSED IN MG ELEMENTAL CALCIUM) 500 MG chewable tablet Chew 2 tablets by mouth 2 (two) times daily as needed for indigestion or heartburn.    Yes Historical Provider, MD  docusate sodium (COLACE) 100 MG capsule Take 1 capsule (100 mg total) by mouth every 12 (twelve) hours. 02/19/16  Yes Benjamin Cartner, [name]  glycopyrrolate (ROBINUL) 2 MG tablet Take 1 tablet (2 mg total) by mouth 2 (two) times daily. 01/25/15  Yes Inda Castle, MD  Hyoscyamine Sulfate 0.375 MG TBCR Take 1 tablet twice daily 01/22/15  Yes Inda Castle, MD  methocarbamol (ROBAXIN) 500 MG tablet Take  1 tablet (500 mg total) by mouth every 8 (eight) hours as needed for muscle spasms. 06/15/16  Yes Theodis Blaze, MD  metoCLOPramide (REGLAN) 5 MG tablet Take 1 tablet (5 mg total) by mouth 2 (two) times daily before a meal. 08/06/16  Yes Nelida Meuse III, MD  multivitamin (RENA-VIT) TABS tablet Take 1 tablet by mouth daily.   Yes Historical Provider, MD  omeprazole (PRILOSEC) 20 MG capsule Take 1 capsule (20 mg total) by mouth daily. 07/27/16  Yes Ezequiel Essex, MD  ondansetron (ZOFRAN) 4 MG tablet Take 1 tablet (4 mg total) by mouth every 6 (six) hours. 07/27/16  Yes Ezequiel Essex, MD  sevelamer carbonate (RENVELA) 2.4 G PACK Take 2.4 g by mouth 3 (three) times daily with meals.    Yes Historical Provider, MD  traMADol (ULTRAM) 50 MG tablet Take 1 tablet (50 mg total) by mouth every 8 (eight) hours as needed. Patient taking differently: Take 50 mg by mouth every 6 (six) hours as needed (for pain).  06/15/16  Yes Theodis Blaze, MD  VOLTAREN 1 % GEL Apply 4 g topically 4 (four) times daily. Patient taking differently: Apply 4 g topically as needed.  02/04/16  Yes April Palumbo, MD  lidocaine (XYLOCAINE) 2 % jelly Apply 1 application topically as needed. Patient taking differently: Apply 1 application topically daily as needed (pain).  02/19/16   Comer Locket, [name]    promethazine (PHENERGAN) 25 MG suppository Place 1 suppository (25 mg total) rectally every 6 (six) hours as needed. 07/29/16   Duffy Bruce, MD    Scheduled Meds: . calcitRIOL  0.5 mcg Oral Q M,W,F-HD  . docusate sodium  100 mg Oral BID  . hydrocortisone sod succinate (SOLU-CORTEF) inj  100 mg Intravenous Q8H  . metoCLOPramide  5 mg Oral BID AC  . multivitamin  1 tablet Oral QHS  . sevelamer carbonate  2.4 g Oral TID WC  . sodium chloride flush  3 mL Intravenous Q12H   Infusions: . pantoprozole (PROTONIX) infusion 8 mg/hr (08/14/16 0223)   PRN Meds: acetaminophen **OR** acetaminophen, albuterol, calcium carbonate, lidocaine, methocarbamol, ondansetron **OR** ondansetron (ZOFRAN) IV, zolpidem   Allergies as of 08/13/2016 - Review Complete 08/13/2016  Allergen Reaction Noted  . Dilaudid [hydromorphone hcl] Other (See Comments) 08/13/2016    Family History  Problem Relation Age of Onset  . Hypertension Mother   . Diabetes Father   . Kidney Stones Sister   . Diabetes Maternal Grandmother   . Liver cancer Maternal Uncle     Social History   Social History  . Marital status: Single    Spouse name: N/A  . Number of children: 0  . Years of education: N/A   Occupational History  . Not on file.   Social History Main Topics  . Smoking status: Former Smoker    Packs/day: 0.20    Years: 13.00    Types: Cigarettes    Quit date: 12/17/2014  . Smokeless tobacco: Never Used  . Alcohol use No  . Drug use: No  . Sexual activity: No   Other Topics Concern  . Not on file   Social History Narrative  . No narrative on file    REVIEW OF SYSTEMS: Constitutional:  He thinks he is probably gained 10 or 15 pounds within the last year. ENT:  No nose bleeds Pulm:  Does not experience shortness of breath or cough. Because of his knee pain, he doesn't exert himself CV:  No palpitations, no LE  edema. No chest pain. No presyncope. GU:  No hematuria, no frequency GI:  No  dysphagia. No bloody or melenic stools.  Last bowel movement was on 08/12/16 Heme:  Hasn't required recent transfusions of blood, epogen or parenteral iron   Transfusions:  none Neuro:  No headaches, no peripheral tingling or numbness Derm:  No itching, no rash or sores.  Endocrine:  No sweats or chills.  No polyuria or dysuria Immunization:  Up to date on flu shot Travel:  None beyond local counties in last few months.    PHYSICAL EXAM: Vital signs in last 24 hours: Vitals:   08/14/16 0556 08/14/16 0921  BP: (!) 80/39 (!) 88/47  Pulse: 83 85  Resp:  17  Temp:  98.7 F (37.1 C)   Wt Readings from Last 3 Encounters:  08/14/16 (!) 141.2 kg (311 lb 4.6 oz)  08/12/16 (!) 140.6 kg (310 lb)  08/06/16 (!) 145.2 kg (320 lb 2 oz)    General: Obese, comfortable, moderately chronically ill appearing. Head:  No asymmetry or signs of head trauma  Eyes:  No scleral icterus or conjunctival pallor. Ears:  Not HOH  Nose:  No congestion, no discharge. Mouth:  Excellent dentition. Moist, pink, clear oral mucous membranes. Halitosis. Neck:  No JVD, no TMG, no masses. Lungs:  Clear bilaterally. No labored breathing or cough. Heart: Heart sounds are distant but regular. Do not appreciate murmurs rubs gallops. S1, S2 present. Abdomen:  Obese, diffusely mildly tender. No guarding or rebound. Bowel sounds normal but hypoactive. No distention. No organomegaly or obvious hernias.  Rectal: Deferred   Musc/Skeltl: Scar on his right knee. No gross joint deformities. Extremities:  Obese. No pedal/ankle edema. Faint, palpable thrill in AV graft on left forearm Neurologic:  No tremor, no limb weakness. He is alert. Oriented 3. Skin:  No rashes, no open sores. Nodes:  No cervical adenopathy.   Psych:  Pleasant, calm, doesn't appear depressed.  Intake/Output from previous day: 01/11 0701 - 01/12 0700 In: 1462.9 [I.V.:362.9; IV Piggyback:1100] Out: 0  Intake/Output this shift: No intake/output data  recorded.  LAB RESULTS:  Recent Labs  08/13/16 1957 08/13/16 2002 08/14/16 0041 08/14/16 0650  WBC 13.7*  --  12.9* 12.4*  HGB 14.0 13.9 13.9 12.2*  HCT 40.2 41.0 39.3 35.9*  PLT 236  --  211 180   BMET Lab Results  Component Value Date   NA 137 08/14/2016   NA 135 08/13/2016   NA 137 08/13/2016   K 3.8 08/14/2016   K 3.4 (L) 08/13/2016   K 3.6 08/13/2016   CL 96 (L) 08/14/2016   CL 98 (L) 08/13/2016   CL 93 (L) 08/13/2016   CO2 21 (L) 08/14/2016   CO2 21 (L) 08/13/2016   CO2 20 (L) 08/12/2016   GLUCOSE 94 08/14/2016   GLUCOSE 107 (H) 08/13/2016   GLUCOSE 105 (H) 08/13/2016   BUN 51 (H) 08/14/2016   BUN 44 (H) 08/13/2016   BUN 43 (H) 08/13/2016   CREATININE 12.93 (H) 08/14/2016   CREATININE 12.30 (H) 08/13/2016   CREATININE 11.43 (H) 08/13/2016   CALCIUM 7.6 (L) 08/14/2016   CALCIUM 8.5 (L) 08/13/2016   CALCIUM 9.5 08/12/2016   LFT  Recent Labs  08/12/16 2317 08/13/16 1957  PROT 10.0* 9.0*  ALBUMIN 4.9 4.6  AST 21 15  ALT 21 18  ALKPHOS 64 55  BILITOT 1.1 1.2   PT/INR Lab Results  Component Value Date   INR 1.12 08/14/2016   INR  1.07 03/27/2014   Hepatitis Panel No results for input(s): HEPBSAG, HCVAB, HEPAIGM, HEPBIGM in the last 72 hours. C-Diff No components found for: CDIFF Lipase     Component Value Date/Time   LIPASE 51 08/13/2016 1957    Drugs of Abuse  No results found for: LABOPIA, COCAINSCRNUR, LABBENZ, AMPHETMU, THCU, LABBARB   RADIOLOGY STUDIES: Ct Abdomen Pelvis Wo Contrast  Result Date: 08/13/2016 CLINICAL DATA:  Generalized abdomen pain, nausea vomiting. EXAM: CT ABDOMEN AND PELVIS WITHOUT CONTRAST TECHNIQUE: Multidetector CT imaging of the abdomen and pelvis was performed following the standard protocol without IV contrast. COMPARISON:  July 27, 2016 FINDINGS: Lower chest: No acute abnormality. Hepatobiliary: No focal liver abnormality is seen. No gallstones, gallbladder wall thickening, or biliary dilatation. Pancreas:  Unremarkable. No pancreatic ductal dilatation or surrounding inflammatory changes. Spleen: Normal in size without focal abnormality. Adrenals/Urinary Tract: The adrenal glands are normal. The bilateral kidneys are atrophic. There multiple bilateral kidney cysts. There is no hydronephrosis bilaterally. Nonobstructing right kidney stones are noted. The bladder is decompressed limiting evaluation. Stomach/Bowel: There is a small hiatal hernia. Appendix appears normal. No evidence of bowel wall thickening, distention, or inflammatory changes. Vascular/Lymphatic: No significant vascular findings are present. No enlarged abdominal or pelvic lymph nodes. Reproductive: Prostate is unremarkable. Other: No abdominal wall hernia or abnormality. No abdominopelvic ascites. Musculoskeletal: No acute or significant osseous findings. IMPRESSION: No acute abnormality.  No bowel obstruction. Bilateral atrophic kidneys with kidney cysts. Electronically Signed   By: Abelardo Diesel M.D.   On: 08/13/2016 18:53      IMPRESSION:   *  Acute flare of episodic epigastric pain, nausea, vomiting. Has had EGD, UGI series, CT scans, ultrasound, HIDA, GES.  All of which are unrevealing. Given his tendency to hypotension and chronic hemodialysis, he have to wonder if his hypotension is leading to diminished gastrointestinal perfusion which he to his symptoms.?  Symptoms somewhat improved within the last 24 hours  *  CG emesis.  Not unexpected in a patient who had had repeated episodes of vomiting.  *  Hypertension, chronic. Doesn't tolerate midodrine.    *  ESRD.   PLAN:     *  Stop every 6 hour CBCs, Recheck in the morning.  *  Stop Protonix drip. Switch to twice daily Protonix.  Switch from PRN Zofran to scheduled every 6 hour dosing.  Switch from oral Reglan to IV and increase dosing frequency.  *  Could repeat EGD. I'm not sure it would add anything. Given the recent nausea vomiting suspect she probably has esophagitis  and gastritis.  *  Right now his diet is pretty much nothing by mouth except for sips with meds and ice chips. I will advance him to clear liquids.   Azucena Freed  08/14/2016, 12:03 PM Pager: [phone]

## 2016-08-14 NOTE — Procedures (Signed)
STable on HD, unable to UF any due to BP drops into the 70's.  No vol excess on exam, looks like he is gaining wt.  Will plan to raise edw.    I was present at this dialysis session, have reviewed the session itself and made  appropriate changes Kelly Splinter MD [place] pager [phone]   08/14/2016, 4:57 PM

## 2016-08-14 NOTE — H&P (Signed)
History and Physical    [name] HXT:[id number] DOB: [birth year] DOA: 08/13/2016  Referring MD/NP/PA:   PCP: Philis Fendt, MD   Patient coming from:  The patient is coming from home.  At baseline, pt is independent for most of ADL.   Chief Complaint: Hematemesis, nausea, vomiting and abdominal pain  HPI: [name] is a 31 y.o. male with medical history significant of s/p of gastric banding, hypertension, GERD, chronic back pain, obesity, ESRD-HD, who presents with hematemesis, nausea, vomiting and abdominal pain.  Patient states that she started having abdominal pain, nausea and vomiting since yesterday. He vomited coffee ground material 3 times. No diarrhea. His abdominal pain is located in the epigastric area, constant, 3 out of 10 in severity, nonradiating. Patient does not have fever or chills. No dark stool. He does not taking ibuprofen. He uses Voltaren gel for pain. He drinks alcohol occasionally. He does not have chest pain, dizziness or lightheadedness. Patient states that he had negative EGD on 2016. No colonoscopy in the past. Patient does not have cough, shortness rest, symptoms of UTI or unilateral weakness.  ED Course: pt was found to have hemoglobin 14.0, WBC 13.7, lactate 4.87-->2.016, negative lipase, potassium 3.6, bicarbonate of 21, creatinine 11.4, temperature normal, tachycardia, tachypnea, O2 sat 100% on room air, negative CT abdomen/pelvis. Blood pressure was soft. Patient is admitted to telemetry bed as inpatient.  Review of Systems:   General: no fevers, chills, no changes in body weight, has poor appetite, has fatigue HEENT: no blurry vision, hearing changes or sore throat Respiratory: no dyspnea, coughing, wheezing CV: no chest pain, no palpitations GI: has nausea, vomiting, abdominal pain, hematemesis. no diarrhea, constipation GU: no dysuria, burning on urination, increased urinary frequency, hematuria  Ext: no leg edema Neuro: no unilateral  weakness, numbness, or tingling, no vision change or hearing loss Skin: no rash, no skin tear. MSK: No muscle spasm, no deformity, no limitation of range of movement in spin Heme: No easy bruising.  Travel history: No recent long distant travel.  Allergy:  Allergies  Allergen Reactions  . Dilaudid [Hydromorphone Hcl] Other (See Comments)    Patient became hypoxic to 60% with dilaudid 61m IV    Past Medical History:  Diagnosis Date  . Arthritis    "all over" (06/11/2016)  . Chronic lower back pain   . Complication of anesthesia    A little while to wake up after knee surgery in 2008  . Dizziness    when coming off of dialysis  . ESRD (end stage renal disease) on dialysis ([facility]    MWF and goes to [facility](06/11/2016)  . Family history of anesthesia complication    mom slow to wake up  . GERD (gastroesophageal reflux disease)    takes Omeprazole as needed  . Headache(784.0)    occasionally  . History of hiatal hernia   . [place]parathyroidism ([place]   . Hypertension   . Joint pain   . Joint swelling   . Morbid obesity ([facility]   . PONV (postoperative nausea and vomiting)   . Renal insufficiency     Past Surgical History:  Procedure Laterality Date  . AV FISTULA PLACEMENT Bilateral 2005,2007   Uses Right arm  . BREATH TEK H PYLORI N/A 05/15/2014   Procedure: BREATH TEK H PYLORI;  Surgeon: DAlphonsa Overall MD;  [facility];  Service: General;  Laterality: N/A;  . ESOPHAGOGASTRODUODENOSCOPY N/A 12/07/2014   Procedure: ESOPHAGOGASTRODUODENOSCOPY (EGD);  Surgeon: DAlphonsa Overall MD;  Location: [facility];  Service: General;  Laterality: N/A;  . ESOPHAGOGASTRODUODENOSCOPY N/A 12/17/2014   Procedure: ESOPHAGOGASTRODUODENOSCOPY (EGD);  Surgeon: Alphonsa Overall, MD;  Location: [facility] [place];  Service: General;  Laterality: N/A;  . ESOPHAGOGASTRODUODENOSCOPY ENDOSCOPY    . HERNIA REPAIR  11/2014   w/lap band OR  . JOINT REPLACEMENT    . KNEE ARTHROSCOPY Left 2007  . LAPAROSCOPIC  GASTRIC BANDING N/A 11/12/2014   Procedure: LAPAROSCOPIC GASTRIC BANDING;  Surgeon: Alphonsa Overall, MD;  Location: [facility];  Service: General;  Laterality: N/A;  . PARATHYROIDECTOMY N/A 03/30/2013   Procedure: TOTAL PARATHYROIDECTOMY WITH AUTOTRANSPLANT;  Surgeon: Earnstine Regal, MD;  Location: [facility];  Service: General;  Laterality: N/A;  Autotransplant to left lower arm.  Marland Kitchen TOTAL KNEE ARTHROPLASTY Right 03/27/2014   Procedure: UNICOMPARTMENTAL ARTHROPLASTY;  Surgeon: Meredith Pel, MD;  Location: [facility];  Service: Orthopedics;  Laterality: Right;    Social History:  reports that he quit smoking about 19 months ago. His smoking use included Cigarettes. He has a 2.60 pack-year smoking history. He has never used smokeless tobacco. He reports that he does not drink alcohol or use drugs.  Family History:  Family History  Problem Relation Age of Onset  . Hypertension Mother   . Diabetes Father   . Kidney Stones Sister   . Diabetes Maternal Grandmother   . Liver cancer Maternal Uncle      Prior to Admission medications   Medication Sig Start Date End Date Taking? Authorizing Provider  albuterol (PROVENTIL HFA;VENTOLIN HFA) 108 (90 Base) MCG/ACT inhaler Inhale 2 puffs into the lungs every 4 (four) hours as needed for wheezing or shortness of breath. 05/14/16  Yes Lysbeth Penner, [name]  calcium carbonate (TUMS - DOSED IN MG ELEMENTAL CALCIUM) 500 MG chewable tablet Chew 2 tablets by mouth 2 (two) times daily as needed for indigestion or heartburn.    Yes Historical Provider, MD  docusate sodium (COLACE) 100 MG capsule Take 1 capsule (100 mg total) by mouth every 12 (twelve) hours. 02/19/16  Yes Benjamin Cartner, [name]  glycopyrrolate (ROBINUL) 2 MG tablet Take 1 tablet (2 mg total) by mouth 2 (two) times daily. 01/25/15  Yes Inda Castle, MD  Hyoscyamine Sulfate 0.375 MG TBCR Take 1 tablet twice daily 01/22/15  Yes Inda Castle, MD  methocarbamol (ROBAXIN) 500 MG tablet Take 1 tablet (500 mg total)  by mouth every 8 (eight) hours as needed for muscle spasms. 06/15/16  Yes Theodis Blaze, MD  metoCLOPramide (REGLAN) 5 MG tablet Take 1 tablet (5 mg total) by mouth 2 (two) times daily before a meal. 08/06/16  Yes Nelida Meuse III, MD  multivitamin (RENA-VIT) TABS tablet Take 1 tablet by mouth daily.   Yes Historical Provider, MD  omeprazole (PRILOSEC) 20 MG capsule Take 1 capsule (20 mg total) by mouth daily. 07/27/16  Yes Ezequiel Essex, MD  ondansetron (ZOFRAN) 4 MG tablet Take 1 tablet (4 mg total) by mouth every 6 (six) hours. 07/27/16  Yes Ezequiel Essex, MD  sevelamer carbonate (RENVELA) 2.4 G PACK Take 2.4 g by mouth 3 (three) times daily with meals.    Yes Historical Provider, MD  traMADol (ULTRAM) 50 MG tablet Take 1 tablet (50 mg total) by mouth every 8 (eight) hours as needed. Patient taking differently: Take 50 mg by mouth every 6 (six) hours as needed (for pain).  06/15/16  Yes Theodis Blaze, MD  VOLTAREN 1 % GEL Apply 4 g topically 4 (four) times  daily. Patient taking differently: Apply 4 g topically as needed.  02/04/16  Yes April Palumbo, MD  lidocaine (XYLOCAINE) 2 % jelly Apply 1 application topically as needed. Patient taking differently: Apply 1 application topically daily as needed (pain).  02/19/16   Comer Locket, [name]  promethazine (PHENERGAN) 25 MG suppository Place 1 suppository (25 mg total) rectally every 6 (six) hours as needed. 07/29/16   Duffy Bruce, MD    Physical Exam: Vitals:   08/13/16 2130 08/13/16 2201 08/13/16 2230 08/14/16 0022  BP: 101/83 113/62 105/63 (!) 70/58  Pulse: 96 93 94   Resp: _0 Temp:      TempSrc:      SpO2: 98% 98% 100%   Weight:      Height:       General: Not in acute distress HEENT:       Eyes: PERRL, EOMI, no scleral icterus.       ENT: No discharge from the ears and nose, no pharynx injection, no tonsillar enlargement.        Neck: No JVD, no bruit, no mass felt. Heme: No neck lymph node enlargement. Cardiac:  S1/S2, RRR, No murmurs, No gallops or rubs. Respiratory: Good air movement bilaterally. No rales, wheezing, rhonchi or rubs. GI: Soft, nondistended, nontender, no rebound pain, no organomegaly, BS present. GU: No hematuria Ext: No pitting leg edema bilaterally. 2+DP/PT pulse bilaterally. Musculoskeletal: No joint deformities, No joint redness or warmth, no limitation of ROM in spin. Skin: No rashes.  Neuro: Alert, oriented X3, cranial nerves II-XII grossly intact, moves all extremities normally. Psych: Patient is not psychotic, no suicidal or hemocidal ideation.  Labs on Admission: I have personally reviewed following labs and imaging studies  CBC:  Recent Labs Lab 08/12/16 2317 08/13/16 1957 08/13/16 2002 08/14/16 0041  WBC 14.4* 13.7*  --  12.9*  NEUTROABS  --  9.9*  --   --   HGB 15.7 14.0 13.9 13.9  HCT 44.7 40.2 41.0 39.3  MCV 91.0 90.5  --  90.8  PLT 213 236  --  257   Basic Metabolic Panel:  Recent Labs Lab 08/12/16 2317 08/13/16 1957 08/13/16 2002  NA 136 137 135  K 3.1* 3.6 3.4*  CL 93* 93* 98*  CO2 20* 21*  --   GLUCOSE 111* 105* 107*  BUN 26* 43* 44*  CREATININE 8.11* 11.43* 12.30*  CALCIUM 9.5 8.5*  --    GFR: Estimated Creatinine Clearance: 11.6 mL/min (by C-G formula based on SCr of 12.3 mg/dL (H)). Liver Function Tests:  Recent Labs Lab 08/12/16 2317 08/13/16 1957  AST 21 15  ALT 21 18  ALKPHOS 64 55  BILITOT 1.1 1.2  PROT 10.0* 9.0*  ALBUMIN 4.9 4.6    Recent Labs Lab 08/12/16 2317 08/13/16 1957  LIPASE 30 51   No results for input(s): AMMONIA in the last 168 hours. Coagulation Profile:  Recent Labs Lab 08/14/16 0041  INR 1.12   Cardiac Enzymes: No results for input(s): CKTOTAL, CKMB, CKMBINDEX, TROPONINI in the last 168 hours. BNP (last 3 results) No results for input(s): PROBNP in the last 8760 hours. HbA1C: No results for input(s): HGBA1C in the last 72 hours. CBG: No results for input(s): GLUCAP in the last 168  hours. Lipid Profile: No results for input(s): CHOL, HDL, LDLCALC, TRIG, CHOLHDL, LDLDIRECT in the last 72 hours. Thyroid Function Tests: No results for input(s): TSH, T4TOTAL, FREET4, T3FREE, THYROIDAB in the last 72 hours. Anemia Panel: No results  for input(s): VITAMINB12, FOLATE, FERRITIN, TIBC, IRON, RETICCTPCT in the last 72 hours. Urine analysis:    Component Value Date/Time   LABSPEC 1.010 01/08/2010 1817   PHURINE >=9.0 01/08/2010 1817   GLUCOSEU 100 (A) 01/08/2010 1817   HGBUR LARGE (A) 01/08/2010 1817   BILIRUBINUR LARGE (A) 01/08/2010 1817   KETONESUR 15 (A) 01/08/2010 1817   PROTEINUR >=300 (A) 01/08/2010 1817   UROBILINOGEN >=8.0 01/08/2010 1817   NITRITE POSITIVE (A) 01/08/2010 1817   LEUKOCYTESUR (A) 01/08/2010 1817    LARGE Biochemical Testing Only. Please order routine urinalysis from main lab if confirmatory testing is needed.   Sepsis Labs: _0 (procalcitonin:4,lacticidven:4) )No results found for this or any previous visit (from the past 240 hour(s)).   Radiological Exams on Admission: Ct Abdomen Pelvis Wo Contrast  Result Date: 08/13/2016 CLINICAL DATA:  Generalized abdomen pain, nausea vomiting. EXAM: CT ABDOMEN AND PELVIS WITHOUT CONTRAST TECHNIQUE: Multidetector CT imaging of the abdomen and pelvis was performed following the standard protocol without IV contrast. COMPARISON:  July 27, 2016 FINDINGS: Lower chest: No acute abnormality. Hepatobiliary: No focal liver abnormality is seen. No gallstones, gallbladder wall thickening, or biliary dilatation. Pancreas: Unremarkable. No pancreatic ductal dilatation or surrounding inflammatory changes. Spleen: Normal in size without focal abnormality. Adrenals/Urinary Tract: The adrenal glands are normal. The bilateral kidneys are atrophic. There multiple bilateral kidney cysts. There is no hydronephrosis bilaterally. Nonobstructing right kidney stones are noted. The bladder is decompressed limiting evaluation.  Stomach/Bowel: There is a small hiatal hernia. Appendix appears normal. No evidence of bowel wall thickening, distention, or inflammatory changes. Vascular/Lymphatic: No significant vascular findings are present. No enlarged abdominal or pelvic lymph nodes. Reproductive: Prostate is unremarkable. Other: No abdominal wall hernia or abnormality. No abdominopelvic ascites. Musculoskeletal: No acute or significant osseous findings. IMPRESSION: No acute abnormality.  No bowel obstruction. Bilateral atrophic kidneys with kidney cysts. Electronically Signed   By: Abelardo Diesel M.D.   On: 08/13/2016 18:53     EKG:  Not done in ED, will get one.   Assessment/Plan Principal Problem:   GIB (gastrointestinal bleeding) Active Problems:   ESRD on dialysis ([place])   Morbid obesity, weight 291, BMI - 47   Abdominal pain, epigastric   Nausea & vomiting   GERD (gastroesophageal reflux disease)   Essential hypertension   SIRS (systemic inflammatory response syndrome) (HCC)   GIB/hematemesis, nausea, vomiting and abdominal pain: Likely due to upper GI bleeding. No oral NSIADs use. May be related to gastric banding? He had negative EDG by Dr. Lucia Gaskins on 2016. hgb 14.0. He had one bp measure of 70/50 which was likely not accurate since the cuff was placed on his wrist. I measured his Bp again, which is 98/63.  - will place on tele bed for obs - NPO  - Start IV pantoprazole gtt - Zofran IV for nausea - Avoid NSAIDs and SQ heparin - hold Vateran gel - Maintain IV access (2 large bore IVs if possible). - Monitor closely and follow q6h cbc, transfuse as necessary. - LaB: INR, PTT and type screen. -please call GI in AM.  SIRS: The patient met criteria for sepsis with leukocytosis, tachycardia and tachypnea, elevated lactate. No source of infection is identified expect for possible gastritis. Pt has no fever. No respiratory symptoms. No symptoms for UTI -hold off Abx -get blood culture -will get Procalcitonin  and trend lactic acid levels per sepsis protocol. -IVF: 500 x 2 NS bolus in ED, followed by 75 cc/h   ESRD on dialys (  MWF): pt has been compliant to dialysis. potassium 3.6, bicarbonate of 21, creatinine 11.4 -continue Renvela -left message due to renal box for dialysis  GERD (gastroesophageal reflux disease) -On protonix gtt   DVT ppx: SCd Code Status: Full code Family Communication: None at bed side.  Disposition Plan:  Anticipate discharge back to previous home environment Consults called:  none Admission status: Inpatient/tele  Date of Service 08/14/2016    Ivor Costa [facility] Pager [phone]  If 7PM-7AM, please contact night-coverage [email or web address] Password [email or web address] 08/14/2016, 1:26 AM

## 2016-08-14 NOTE — Progress Notes (Signed)
Received report from ED RN. Room ready for patient. Rachael Ferrie Joselita, RN 

## 2016-08-14 NOTE — Consult Note (Signed)
[facility] Renal Consultation Note    Indication for Consultation:  Management of ESRD/hemodialysis; anemia, hypertension/volume and secondary hyperparathyroidism  HPI: [name] is a 31 y.o. male with ESRD on hemodialysis MWFS at [facility]. PMH includes morbid obesity, GERD, chronic pain, chronic hypotension. S/p lap band in 2016.  Has been evaluated for recurrent abdominal pain since then. Recent hospital admission in 06/2016 with abdominal pain- ?cholelithiasis vs gastritis. No surgical intervention at that time. Has been treating with Zantac/Phenergan.  Reported to ED with progressive abdominal pain with nausea that began 2 days ago. Initially having clear emesis then yesterday became dark coffee-colored.  ED course: BP 92/47 Pulse 88 Temp 97.6 Hgb stable 14 K 3.6 CT scan of abdomen with no acute findings. He is admitted under observation status for further evaluation  He reports epigastric abdominal pain that does not radiate. Nausea has improved. Has not been able to tolerate solid foods for 2 days. Denies diarrhea or bloody stools. Denies chest pain, SOB, fever, chills.  Last HD was 1/10 completed full treatment and left 3.5kg above EDW. Has issues with large IDWG and not meeting EDW.He has been reporting cramps during HD and  EDW increased to 137.5 kg at last HD.  Past Medical History:  Diagnosis Date  . Arthritis    "all over" (06/11/2016)  . Chronic lower back pain   . Complication of anesthesia    A little while to wake up after knee surgery in 2008  . Dizziness    when coming off of dialysis  . ESRD (end stage renal disease) on dialysis [facility])    MWF and goes to [facility] (06/11/2016)  . Family history of anesthesia complication    mom slow to wake up  . GERD (gastroesophageal reflux disease)    takes Omeprazole as needed  . Headache(784.0)    occasionally  . History of hiatal hernia   . Hyperparathyroidism ([place])   . Hypertension   . Joint  pain   . Joint swelling   . Morbid obesity ([place])   . PONV (postoperative nausea and vomiting)   . Renal insufficiency    Past Surgical History:  Procedure Laterality Date  . AV FISTULA PLACEMENT Bilateral 2005,2007   Uses Right arm  . BREATH TEK H PYLORI N/A 05/15/2014   Procedure: BREATH TEK H PYLORI;  Surgeon: Alphonsa Overall, MD;  Location: [facility];  Service: General;  Laterality: N/A;  . ESOPHAGOGASTRODUODENOSCOPY N/A 12/07/2014   Procedure: ESOPHAGOGASTRODUODENOSCOPY (EGD);  Surgeon: Alphonsa Overall, MD;  Location: [facility] ENDOSCOPY;  Service: General;  Laterality: N/A;  . ESOPHAGOGASTRODUODENOSCOPY N/A 12/17/2014   Procedure: ESOPHAGOGASTRODUODENOSCOPY (EGD);  Surgeon: Alphonsa Overall, MD;  Location: [place];  Service: General;  Laterality: N/A;  . ESOPHAGOGASTRODUODENOSCOPY ENDOSCOPY    . HERNIA REPAIR  11/2014   w/lap band OR  . JOINT REPLACEMENT    . KNEE ARTHROSCOPY Left 2007  . LAPAROSCOPIC GASTRIC BANDING N/A 11/12/2014   Procedure: LAPAROSCOPIC GASTRIC BANDING;  Surgeon: Alphonsa Overall, MD;  Location: [facility];  Service: General;  Laterality: N/A;  . PARATHYROIDECTOMY N/A 03/30/2013   Procedure: TOTAL PARATHYROIDECTOMY WITH AUTOTRANSPLANT;  Surgeon: Earnstine Regal, MD;  Location: [place];  Service: General;  Laterality: N/A;  Autotransplant to left lower arm.  Marland Kitchen TOTAL KNEE ARTHROPLASTY Right 03/27/2014   Procedure: UNICOMPARTMENTAL ARTHROPLASTY;  Surgeon: Meredith Pel, MD;  Location: [place];  Service: Orthopedics;  Laterality: Right;   Family History  Problem Relation Age of Onset  .  Hypertension Mother   . Diabetes Father   . Kidney Stones Sister   . Diabetes Maternal Grandmother   . Liver cancer Maternal Uncle    Social History:  reports that he quit smoking about 19 months ago. His smoking use included Cigarettes. He has a 2.60 pack-year smoking history. He has never used smokeless tobacco. He reports that he does not drink alcohol or use drugs. Allergies  Allergen Reactions  .  Dilaudid [Hydromorphone Hcl] Other (See Comments)    Patient became hypoxic to 60% with dilaudid 1mg  IV   Prior to Admission medications   Medication Sig Start Date End Date Taking? Authorizing Provider  albuterol (PROVENTIL HFA;VENTOLIN HFA) 108 (90 Base) MCG/ACT inhaler Inhale 2 puffs into the lungs every 4 (four) hours as needed for wheezing or shortness of breath. 05/14/16  Yes Lysbeth Penner, [name]  calcium carbonate (TUMS - DOSED IN MG ELEMENTAL CALCIUM) 500 MG chewable tablet Chew 2 tablets by mouth 2 (two) times daily as needed for indigestion or heartburn.    Yes Historical Provider, MD  docusate sodium (COLACE) 100 MG capsule Take 1 capsule (100 mg total) by mouth every 12 (twelve) hours. 02/19/16  Yes Benjamin Cartner, [name]  glycopyrrolate (ROBINUL) 2 MG tablet Take 1 tablet (2 mg total) by mouth 2 (two) times daily. 01/25/15  Yes Inda Castle, MD  Hyoscyamine Sulfate 0.375 MG TBCR Take 1 tablet twice daily 01/22/15  Yes Inda Castle, MD  methocarbamol (ROBAXIN) 500 MG tablet Take 1 tablet (500 mg total) by mouth every 8 (eight) hours as needed for muscle spasms. 06/15/16  Yes Theodis Blaze, MD  metoCLOPramide (REGLAN) 5 MG tablet Take 1 tablet (5 mg total) by mouth 2 (two) times daily before a meal. 08/06/16  Yes Nelida Meuse III, MD  multivitamin (RENA-VIT) TABS tablet Take 1 tablet by mouth daily.   Yes Historical Provider, MD  omeprazole (PRILOSEC) 20 MG capsule Take 1 capsule (20 mg total) by mouth daily. 07/27/16  Yes Ezequiel Essex, MD  ondansetron (ZOFRAN) 4 MG tablet Take 1 tablet (4 mg total) by mouth every 6 (six) hours. 07/27/16  Yes Ezequiel Essex, MD  sevelamer carbonate (RENVELA) 2.4 G PACK Take 2.4 g by mouth 3 (three) times daily with meals.    Yes Historical Provider, MD  traMADol (ULTRAM) 50 MG tablet Take 1 tablet (50 mg total) by mouth every 8 (eight) hours as needed. Patient taking differently: Take 50 mg by mouth every 6 (six) hours as needed (for pain).   06/15/16  Yes Theodis Blaze, MD  VOLTAREN 1 % GEL Apply 4 g topically 4 (four) times daily. Patient taking differently: Apply 4 g topically as needed.  02/04/16  Yes April Palumbo, MD  lidocaine (XYLOCAINE) 2 % jelly Apply 1 application topically as needed. Patient taking differently: Apply 1 application topically daily as needed (pain).  02/19/16   Comer Locket, [name]  promethazine (PHENERGAN) 25 MG suppository Place 1 suppository (25 mg total) rectally every 6 (six) hours as needed. 07/29/16   Duffy Bruce, MD   Current Facility-Administered Medications  Medication Dose Route Frequency Provider Last Rate Last Dose  . 0.9 %  sodium chloride infusion   Intravenous Continuous Ivor Costa, MD 75 mL/hr at 08/14/16 [phone]    . acetaminophen (TYLENOL) tablet 650 mg  650 mg Oral Q6H PRN Ivor Costa, MD       Or  . acetaminophen (TYLENOL) suppository 650 mg  650 mg Rectal Q6H PRN Soledad Gerlach  Blaine Hamper, MD      . albuterol (PROVENTIL) (2.5 MG/3ML) 0.083% nebulizer solution 2.5 mg  2.5 mg Inhalation Q4H PRN Ivor Costa, MD      . calcium carbonate (TUMS - dosed in mg elemental calcium) chewable tablet 400 mg of elemental calcium  2 tablet Oral BID PRN Ivor Costa, MD      . docusate sodium (COLACE) capsule 100 mg  100 mg Oral BID Ivor Costa, MD      . lidocaine (XYLOCAINE) 2 % jelly 1 application  1 application Topical Daily PRN Ivor Costa, MD      . methocarbamol (ROBAXIN) tablet 500 mg  500 mg Oral Q8H PRN Ivor Costa, MD      . metoCLOPramide (REGLAN) tablet 5 mg  5 mg Oral BID AC Ivor Costa, MD   5 mg at 08/14/16 0840  . multivitamin (RENA-VIT) tablet 1 tablet  1 tablet Oral QHS Ivor Costa, MD      . ondansetron [facility]) tablet 4 mg  4 mg Oral Q6H PRN Ivor Costa, MD       Or  . ondansetron [facility]) injection 4 mg  4 mg Intravenous Q6H PRN Ivor Costa, MD      . pantoprazole (PROTONIX) 80 mg in sodium chloride 0.9 % 250 mL (0.32 mg/mL) infusion  8 mg/hr Intravenous Continuous Ivor Costa, MD 25 mL/hr at 08/14/16 0223 8 mg/hr at  08/14/16 0223  . sevelamer carbonate (RENVELA) powder PACK 2.4 g  2.4 g Oral TID WC Ivor Costa, MD      . sodium chloride flush (NS) 0.9 % injection 3 mL  3 mL Intravenous Q12H Ivor Costa, MD   3 mL at 08/14/16 0245  . zolpidem (AMBIEN) tablet 5 mg  5 mg Oral QHS PRN Ivor Costa, MD       Labs: Basic Metabolic Panel:  Recent Labs Lab 08/12/16 2317 08/13/16 1957 08/13/16 2002 08/14/16 0650  NA 136 137 135 137  K 3.1* 3.6 3.4* 3.8  CL 93* 93* 98* 96*  CO2 20* 21*  --  21*  GLUCOSE 111* 105* 107* 94  BUN 26* 43* 44* 51*  CREATININE 8.11* 11.43* 12.30* 12.93*  CALCIUM 9.5 8.5*  --  7.6*   Liver Function Tests:  Recent Labs Lab 08/12/16 2317 08/13/16 1957  AST 21 15  ALT 21 18  ALKPHOS 64 55  BILITOT 1.1 1.2  PROT 10.0* 9.0*  ALBUMIN 4.9 4.6    Recent Labs Lab 08/12/16 2317 08/13/16 1957  LIPASE 30 51   No results for input(s): AMMONIA in the last 168 hours. CBC:  Recent Labs Lab 08/12/16 2317 08/13/16 1957 08/13/16 2002 08/14/16 0041 08/14/16 0650  WBC 14.4* 13.7*  --  12.9* 12.4*  NEUTROABS  --  9.9*  --   --   --   HGB 15.7 14.0 13.9 13.9 12.2*  HCT 44.7 40.2 41.0 39.3 35.9*  MCV 91.0 90.5  --  90.8 92.1  PLT 213 236  --  211 180   Cardiac Enzymes: No results for input(s): CKTOTAL, CKMB, CKMBINDEX, TROPONINI in the last 168 hours. CBG:  Recent Labs Lab 08/14/16 0801  GLUCAP 105*   Iron Studies: No results for input(s): IRON, TIBC, TRANSFERRIN, FERRITIN in the last 72 hours. Studies/Results: Ct Abdomen Pelvis Wo Contrast  Result Date: 08/13/2016 CLINICAL DATA:  Generalized abdomen pain, nausea vomiting. EXAM: CT ABDOMEN AND PELVIS WITHOUT CONTRAST TECHNIQUE: Multidetector CT imaging of the abdomen and pelvis was performed following the standard protocol without  IV contrast. COMPARISON:  July 27, 2016 FINDINGS: Lower chest: No acute abnormality. Hepatobiliary: No focal liver abnormality is seen. No gallstones, gallbladder wall thickening, or  biliary dilatation. Pancreas: Unremarkable. No pancreatic ductal dilatation or surrounding inflammatory changes. Spleen: Normal in size without focal abnormality. Adrenals/Urinary Tract: The adrenal glands are normal. The bilateral kidneys are atrophic. There multiple bilateral kidney cysts. There is no hydronephrosis bilaterally. Nonobstructing right kidney stones are noted. The bladder is decompressed limiting evaluation. Stomach/Bowel: There is a small hiatal hernia. Appendix appears normal. No evidence of bowel wall thickening, distention, or inflammatory changes. Vascular/Lymphatic: No significant vascular findings are present. No enlarged abdominal or pelvic lymph nodes. Reproductive: Prostate is unremarkable. Other: No abdominal wall hernia or abnormality. No abdominopelvic ascites. Musculoskeletal: No acute or significant osseous findings. IMPRESSION: No acute abnormality.  No bowel obstruction. Bilateral atrophic kidneys with kidney cysts. Electronically Signed   By: Abelardo Diesel M.D.   On: 08/13/2016 18:53    ROS: As per HPI otherwise negative.  Physical Exam: Vitals:   08/14/16 0214 08/14/16 0538 08/14/16 0556 08/14/16 0921  BP: (!) 85/49 (!) 64/37 (!) 80/39 (!) 88/47  Pulse: 86 90 83 85  Resp: 16 18  17   Temp: 98.3 F (36.8 C) 98.4 F (36.9 C)  98.7 F (37.1 C)  TempSrc: Oral Oral  Oral  SpO2: 98% 95%  97%  Weight: (!) 140.8 kg (310 lb 6.5 oz)     Height: 5\' 6"  (1.676 m)        General: Obese AAM NAD Head: NCAT sclera not icteric MMM Neck: Supple. No JVD Lungs:  Breathing is unlabored. Diminshed at bases faint crackles  Heart: RRR with S1 S2.  Abdomen: soft NT + BS Lower extremities:without edema or ischemic changes, no open wounds  Neuro: A & O  X 3. Moves all extremities spontaneously. Psych:  Responds to questions appropriately with a normal affect. Dialysis Access: LUE AVF +bruit   Dialysis Orders:  [facility] MWFSat 4:45 hours, EDW 137.5kg, 2K/2.5Ca, AVF, BFR 450/DFR  800 - Calcitriol 0.5mcg PO q HD - No ESA or IV iron - Heparin 12,000 units bolus  Assessment/Plan: 1.  Abdominal pain/nausea/hematemesis - w/u per admit GI consulted  2.  ESRD -  MWFS - for HD today K 3.8 4K bath Hold heparin with possible GIB 3.  Hypotension - chronic hypotension - does not tolerate midodrine  4. Volume  - Large volume gains - EDW recently increased 1kg for cramping up still up  3kg pre HD with 1750 ml  IVF bolus today  UF to dry as tolerated d/c IVF   5.  Anemia  - Hgb 12.2 , follow no OP ESA  6.  Metabolic bone disease -  Cont VDRA/Renvela Tums  7.  Nutrition - NPO currently renal diet/vitamins when diet resumes   Lynnda Child [name] [place] Pager [phone] 08/14/2016, 10:30 AM   Pt seen, examined and agree w A/P as above. ESRD pt with low BP's and no vol excess on exam, looks like he is gaining body wt. Will raise dry wt.  W/U for GIB in progress.  Kelly Splinter MD [employer] pager [phone]   08/14/2016, 4:57 PM

## 2016-08-14 NOTE — Care Management Obs Status (Signed)
[place] NOTIFICATION   Patient Details  Name: BARUC TUGWELL MRN: [id number] Date of Birth: [birth year]   Medicare Observation Status Notification Given:  Yes    Grainne Knights, Rory Percy, RN 08/14/2016, 3:58 PM

## 2016-08-14 NOTE — ED Notes (Signed)
Nurse will let me know if she will draw addl labs

## 2016-08-15 LAB — CBC
HCT: 33.6 % — ABNORMAL LOW (ref 39.0–52.0)
HEMOGLOBIN: 11.6 g/dL — AB (ref 13.0–17.0)
MCH: 31.4 pg (ref 26.0–34.0)
MCHC: 34.5 g/dL (ref 30.0–36.0)
MCV: 91.1 fL (ref 78.0–100.0)
PLATELETS: 173 10*3/uL (ref 150–400)
RBC: 3.69 MIL/uL — ABNORMAL LOW (ref 4.22–5.81)
RDW: 13.2 % (ref 11.5–15.5)
WBC: 10.1 10*3/uL (ref 4.0–10.5)

## 2016-08-15 LAB — HEPATITIS B SURFACE ANTIGEN: HEP B S AG: NEGATIVE

## 2016-08-15 LAB — GLUCOSE, CAPILLARY: GLUCOSE-CAPILLARY: 92 mg/dL (ref 65–99)

## 2016-08-15 NOTE — Progress Notes (Signed)
[facility] Progress Note  Dialysis Orders:  [facility] MWFSat 4:45 hours, EDW 137.5kg, 2K/2.5Ca, AVF, BFR 450/DFR 800 - Calcitriol 0.35mcg PO q HD - No ESA or IV iron - Heparin 12,000 units bolus  Assessment/Plan: 1. Abdominal pain/nausea/ - w/u per admit GI following 2.  ESRD -  MWF during admit  3.  Hypotension - chronic hypotension, asymptomatic - does not tolerate midodrine  4. Volume  - HD yesterday with no UF and no signs of volume overload , no edema lungs clear -  believe he has gained body mass will raise EDW ~139 kg  5.  Anemia  - Hgb 11.6, follow no OP ESA  6.  Metabolic bone disease -  Cont VDRA/Renvela Tums  7.  Nutrition - NPO currently renal diet/vitamins when diet resumes   Plan - next HD 1/15, raise EDW   Lynnda Child [name] [facility] Pager [phone] 08/15/2016,8:33 AM  LOS: 1 day   Pt seen, examined and agree w A/P as above. GI suspects gastric dysrhythmia/ cyclic vomiting syndrome. For possible EGD tomorrow.  Kelly Splinter MD [facility] pager [phone]   08/15/2016, 10:25 AM    Subjective:  No c/os No N/V overnight HD yesterday no UF  Objective Vitals:   08/14/16 1707 08/14/16 2100 08/15/16 0000 08/15/16 0625  BP: (!) 89/57 (!) 103/50  (!) 86/41  Pulse: 87 73  75  Resp: 17 16  18   Temp: 98.8 F (37.1 C) 97.6 F (36.4 C) 98.7 F (37.1 C) 98.4 F (36.9 C)  TempSrc: Oral Oral  Oral  SpO2: 99% 97%  98%  Weight:      Height:       Physical Exam General: Obese AAM NAD Heart: RRR Lungs: CTAB  Abdomen: soft NT Bs+  Extremities: no LE edema  Dialysis Access: LUE AVF+bruit   Additional Objective Labs: Basic Metabolic Panel:  Recent Labs Lab 08/12/16 2317 08/13/16 1957 08/13/16 2002 08/14/16 0650  NA 136 137 135 137  K 3.1* 3.6 3.4* 3.8  CL 93* 93* 98* 96*  CO2 20* 21*  --  21*  GLUCOSE 111* 105* 107* 94  BUN 26* 43* 44* 51*  CREATININE 8.11* 11.43* 12.30* 12.93*  CALCIUM 9.5 8.5*  --   7.6*   Liver Function Tests:  Recent Labs Lab 08/12/16 2317 08/13/16 1957  AST 21 15  ALT 21 18  ALKPHOS 64 55  BILITOT 1.1 1.2  PROT 10.0* 9.0*  ALBUMIN 4.9 4.6    Recent Labs Lab 08/12/16 2317 08/13/16 1957  LIPASE 30 51   CBC:  Recent Labs Lab 08/12/16 2317 08/13/16 1957  08/14/16 0041 08/14/16 0650 08/15/16 0717  WBC 14.4* 13.7*  --  12.9* 12.4* 10.1  NEUTROABS  --  9.9*  --   --   --   --   HGB 15.7 14.0  < > 13.9 12.2* 11.6*  HCT 44.7 40.2  < > 39.3 35.9* 33.6*  MCV 91.0 90.5  --  90.8 92.1 91.1  PLT 213 236  --  211 180 173  < > = values in this interval not displayed. Blood Culture    Component Value Date/Time   SDES URINE, CLEAN CATCH 01/08/2010 1844   SPECREQUEST NONE 01/08/2010 1844   CULT  01/08/2010 1844    Multiple bacterial morphotypes present, none predominant. Suggest appropriate recollection if clinically indicated.   REPTSTATUS 01/10/2010 FINAL 01/08/2010 1844    Cardiac Enzymes: No results for input(s): CKTOTAL, CKMB, CKMBINDEX, TROPONINI  in the last 168 hours. CBG:  Recent Labs Lab 08/14/16 0801 08/15/16 0004  GLUCAP 105* 92   Iron Studies: No results for input(s): IRON, TIBC, TRANSFERRIN, FERRITIN in the last 72 hours. Lab Results  Component Value Date   INR 1.12 08/14/2016   INR 1.07 03/27/2014   Medications:  . calcitRIOL  0.5 mcg Oral Q M,W,F-HD  . docusate sodium  100 mg Oral BID  . hydrocortisone sod succinate (SOLU-CORTEF) inj  100 mg Intravenous Q8H  . metoCLOPramide (REGLAN) injection  5 mg Intravenous Q6H  . multivitamin  1 tablet Oral QHS  . ondansetron (ZOFRAN) IV  4 mg Intravenous Q6H  . pantoprazole (PROTONIX) IV  40 mg Intravenous Q24H  . sevelamer carbonate  2.4 g Oral TID WC  . sodium chloride flush  3 mL Intravenous Q12H

## 2016-08-15 NOTE — Progress Notes (Signed)
PROGRESS NOTE  [name] XNT:[id number] DOB: [birth year] DOA: 08/13/2016 PCP: Philis Fendt, MD  HPI/Recap of past 24 hours:  No vomiting since being admitted, denies ab pain, report continued  nauseous, but want to try advance diet,  He also want to walk downstairs with his girlfriend  Assessment/Plan: Principal Problem:   GIB (gastrointestinal bleeding) Active Problems:   ESRD on dialysis ([place])   Morbid obesity, weight 291, BMI - 47   Abdominal pain, epigastric   Nausea & vomiting   GERD (gastroesophageal reflux disease)   Essential hypertension   SIRS (systemic inflammatory response syndrome) ([facility])  N/v/abdomina pain Ctab /pel no acute findings.  No diarrhea, no fever Cyclic n/v  GI consulted, EGD in am, diet per GI  dehydration with leukocytosis, lactic acidosis on presentation  ESRD on HD  Hypotension:asymptomatic  Chronic per nephrogy   Morbid obesity: Body mass index is 49.92 kg/m.   Code Status: full  Family Communication: patient   Disposition Plan: pending GI eval   Consultants:  LBGI  nephrology  Procedures:  egd on 1/14  Antibiotics:  none   Objective: BP 95/60 (BP Location: Left Wrist)   Pulse 71   Temp 97.9 F (36.6 C) (Oral)   Resp 18   Ht 5\' 6"  (1.676 m)   Wt (!) 140.3 kg (309 lb 4.9 oz)   SpO2 98%   BMI 49.92 kg/m   Intake/Output Summary (Last 24 hours) at 08/15/16 1115 Last data filed at 08/15/16 0900  Gross per 24 hour  Intake           529.17 ml  Output              286 ml  Net           243.17 ml   Filed Weights   08/14/16 1114 08/14/16 1305 08/14/16 1635  Weight: (!) 141.2 kg (311 lb 4.6 oz) (!) 140.6 kg (309 lb 15.5 oz) (!) 140.3 kg (309 lb 4.9 oz)    Exam:   General:  NAD, obese  Cardiovascular: RRR  Respiratory: CTABL  Abdomen: Soft/ND/NT, positive BS  Musculoskeletal: No Edema  Neuro: aaox3  Data Reviewed: Basic Metabolic Panel:  Recent Labs Lab 08/12/16 2317 08/13/16 1957  08/13/16 2002 08/14/16 0650  NA 136 137 135 137  K 3.1* 3.6 3.4* 3.8  CL 93* 93* 98* 96*  CO2 20* 21*  --  21*  GLUCOSE 111* 105* 107* 94  BUN 26* 43* 44* 51*  CREATININE 8.11* 11.43* 12.30* 12.93*  CALCIUM 9.5 8.5*  --  7.6*   Liver Function Tests:  Recent Labs Lab 08/12/16 2317 08/13/16 1957  AST 21 15  ALT 21 18  ALKPHOS 64 55  BILITOT 1.1 1.2  PROT 10.0* 9.0*  ALBUMIN 4.9 4.6    Recent Labs Lab 08/12/16 2317 08/13/16 1957  LIPASE 30 51   No results for input(s): AMMONIA in the last 168 hours. CBC:  Recent Labs Lab 08/12/16 2317 08/13/16 1957 08/13/16 2002 08/14/16 0041 08/14/16 0650 08/15/16 0717  WBC 14.4* 13.7*  --  12.9* 12.4* 10.1  NEUTROABS  --  9.9*  --   --   --   --   HGB 15.7 14.0 13.9 13.9 12.2* 11.6*  HCT 44.7 40.2 41.0 39.3 35.9* 33.6*  MCV 91.0 90.5  --  90.8 92.1 91.1  PLT 213 236  --  211 180 173   Cardiac Enzymes:   No results for input(s): CKTOTAL, CKMB, CKMBINDEX, TROPONINI  in the last 168 hours. BNP (last 3 results) No results for input(s): BNP in the last 8760 hours.  ProBNP (last 3 results) No results for input(s): PROBNP in the last 8760 hours.  CBG:  Recent Labs Lab 08/14/16 0801 08/15/16 0004  GLUCAP 105* 92    Recent Results (from the past 240 hour(s))  Culture, blood (Routine X 2) w Reflex to ID Panel     Status: None (Preliminary result)   Collection Time: 08/14/16  1:35 AM  Result Value Ref Range Status   Specimen Description BLOOD BLOOD RIGHT FOREARM  Final   Special Requests BOTTLES DRAWN AEROBIC AND ANAEROBIC 5CC  Final   Culture NO GROWTH 1 DAY  Final   Report Status PENDING  Incomplete  Culture, blood (Routine X 2) w Reflex to ID Panel     Status: None (Preliminary result)   Collection Time: 08/14/16  1:43 AM  Result Value Ref Range Status   Specimen Description BLOOD RIGHT HAND  Final   Special Requests BOTTLES DRAWN AEROBIC ONLY 5CC  Final   Culture NO GROWTH 1 DAY  Final   Report Status PENDING   Incomplete  MRSA PCR Screening     Status: None   Collection Time: 08/14/16  2:41 AM  Result Value Ref Range Status   MRSA by PCR NEGATIVE NEGATIVE Final    Comment:        The GeneXpert MRSA Assay (FDA approved for NASAL specimens only), is one component of a comprehensive MRSA colonization surveillance program. It is not intended to diagnose MRSA infection nor to guide or monitor treatment for MRSA infections.      Studies: No results found.  Scheduled Meds: . calcitRIOL  0.5 mcg Oral Q M,W,F-HD  . docusate sodium  100 mg Oral BID  . metoCLOPramide (REGLAN) injection  5 mg Intravenous Q6H  . multivitamin  1 tablet Oral QHS  . ondansetron (ZOFRAN) IV  4 mg Intravenous Q6H  . pantoprazole (PROTONIX) IV  40 mg Intravenous Q24H  . sevelamer carbonate  2.4 g Oral TID WC  . sodium chloride flush  3 mL Intravenous Q12H    Continuous Infusions:   Time spent: 25 mins  Romanita Fager MD, PhD  [facility] Pager [phone]. If 7PM-7AM, please contact night-coverage at [email or web address], password [facility] 08/15/2016, 11:15 AM  LOS: 1 day

## 2016-08-15 NOTE — Progress Notes (Signed)
Patient ID: [name], male   DOB: 06/25/1986, 31 y.o.   MRN: [id number]    Progress Note   Subjective  Feels a little better- no vomiting, less nausea- says not really having pain after eating -it's more of a nausea type feeling Keeping down clears and willing to try advancing   Objective   Vital signs in last 24 hours: Temp:  [97 F (36.1 C)-98.8 F (37.1 C)] 97.9 F (36.6 C) (01/13 0931) Pulse Rate:  [68-87] 71 (01/13 0931) Resp:  [12-24] 18 (01/13 0931) BP: (69-103)/(29-60) 95/60 (01/13 0931) SpO2:  [97 %-99 %] 98 % (01/13 0931) Weight:  [309 lb 4.9 oz (140.3 kg)-311 lb 4.6 oz (141.2 kg)] 309 lb 4.9 oz (140.3 kg) (01/12 1635) Last BM Date: 08/12/16 General:    Obese AA male  in NAD Heart:  Regular rate and rhythm; no murmurs Lungs: Respirations even and unlabored, lungs CTA bilaterally Abdomen:  Soft, obese, non tender , BS + Extremities:  Without edema. Neurologic:  Alert and oriented,  grossly normal neurologically. Psych:  Cooperative. Normal mood and affect.  Intake/Output from previous day: 01/12 0701 - 01/13 0700 In: 289.2 [P.O.:120; I.V.:169.2] Out: 286  Intake/Output this shift: Total I/O In: 240 [P.O.:240] Out: 0   Lab Results:  Recent Labs  08/14/16 0041 08/14/16 0650 08/15/16 0717  WBC 12.9* 12.4* 10.1  HGB 13.9 12.2* 11.6*  HCT 39.3 35.9* 33.6*  PLT 211 180 173   BMET  Recent Labs  08/12/16 2317 08/13/16 1957 08/13/16 2002 08/14/16 0650  NA 136 137 135 137  K 3.1* 3.6 3.4* 3.8  CL 93* 93* 98* 96*  CO2 20* 21*  --  21*  GLUCOSE 111* 105* 107* 94  BUN 26* 43* 44* 51*  CREATININE 8.11* 11.43* 12.30* 12.93*  CALCIUM 9.5 8.5*  --  7.6*   LFT  Recent Labs  08/13/16 1957  PROT 9.0*  ALBUMIN 4.6  AST 15  ALT 18  ALKPHOS 55  BILITOT 1.2   PT/INR  Recent Labs  08/14/16 0041  LABPROT 14.5  INR 1.12    Studies/Results: Ct Abdomen Pelvis Wo Contrast  Result Date: 08/13/2016 CLINICAL DATA:  Generalized abdomen pain,  nausea vomiting. EXAM: CT ABDOMEN AND PELVIS WITHOUT CONTRAST TECHNIQUE: Multidetector CT imaging of the abdomen and pelvis was performed following the standard protocol without IV contrast. COMPARISON:  July 27, 2016 FINDINGS: Lower chest: No acute abnormality. Hepatobiliary: No focal liver abnormality is seen. No gallstones, gallbladder wall thickening, or biliary dilatation. Pancreas: Unremarkable. No pancreatic ductal dilatation or surrounding inflammatory changes. Spleen: Normal in size without focal abnormality. Adrenals/Urinary Tract: The adrenal glands are normal. The bilateral kidneys are atrophic. There multiple bilateral kidney cysts. There is no hydronephrosis bilaterally. Nonobstructing right kidney stones are noted. The bladder is decompressed limiting evaluation. Stomach/Bowel: There is a small hiatal hernia. Appendix appears normal. No evidence of bowel wall thickening, distention, or inflammatory changes. Vascular/Lymphatic: No significant vascular findings are present. No enlarged abdominal or pelvic lymph nodes. Reproductive: Prostate is unremarkable. Other: No abdominal wall hernia or abnormality. No abdominopelvic ascites. Musculoskeletal: No acute or significant osseous findings. IMPRESSION: No acute abnormality.  No bowel obstruction. Bilateral atrophic kidneys with kidney cysts. Electronically Signed   By: Abelardo Diesel M.D.   On: 08/13/2016 18:53       Assessment / Plan:    #1 31 yo AA male with ESRD on dialysis with recurrent epigastric discomfort, nausea/vomiting  Suspect gastric dysrhythmia, cyclic vomiting syndrome EGD in  2016 with hypertrophied antral mucosa(not bx'd)- r/o gastric lesion,partial outlet obs  Will advance to full liquids today NPO after MN, for probable EGD in am Continue  IV reglan, Zofran   Principal Problem:   GIB (gastrointestinal bleeding) Active Problems:   ESRD on dialysis ([place])   Morbid obesity, weight 291, BMI - 47   Abdominal pain,  epigastric   Nausea & vomiting   GERD (gastroesophageal reflux disease)   Essential hypertension   SIRS (systemic inflammatory response syndrome) ([facility])     LOS: 1 day   Ervin Hensley  08/15/2016, 10:01 AM

## 2016-08-16 ENCOUNTER — Inpatient Hospital Stay (HOSPITAL_COMMUNITY): Payer: Medicare Other | Admitting: Certified Registered Nurse Anesthetist

## 2016-08-16 ENCOUNTER — Encounter (HOSPITAL_COMMUNITY): Payer: Self-pay | Admitting: Certified Registered Nurse Anesthetist

## 2016-08-16 ENCOUNTER — Encounter (HOSPITAL_COMMUNITY)
Admission: EM | Disposition: A | Discharge status: Home / Self Care | Payer: Self-pay | Source: Home / Self Care | Attending: Internal Medicine

## 2016-08-16 DIAGNOSIS — K2970 Gastritis, unspecified, without bleeding: Secondary | ICD-10-CM

## 2016-08-16 DIAGNOSIS — K2980 Duodenitis without bleeding: Principal | ICD-10-CM

## 2016-08-16 HISTORY — PX: ESOPHAGOGASTRODUODENOSCOPY: SHX5428

## 2016-08-16 LAB — CBC WITH DIFFERENTIAL/PLATELET
BASOS ABS: 0 10*3/uL (ref 0.0–0.1)
BASOS PCT: 0 %
EOS PCT: 3 %
Eosinophils Absolute: 0.3 10*3/uL (ref 0.0–0.7)
HEMATOCRIT: 31.7 % — AB (ref 39.0–52.0)
Hemoglobin: 11.1 g/dL — ABNORMAL LOW (ref 13.0–17.0)
Lymphocytes Relative: 41 %
Lymphs Abs: 4.1 10*3/uL — ABNORMAL HIGH (ref 0.7–4.0)
MCH: 31.7 pg (ref 26.0–34.0)
MCHC: 35 g/dL (ref 30.0–36.0)
MCV: 90.6 fL (ref 78.0–100.0)
MONO ABS: 0.9 10*3/uL (ref 0.1–1.0)
Monocytes Relative: 9 %
NEUTROS ABS: 4.7 10*3/uL (ref 1.7–7.7)
Neutrophils Relative %: 47 %
PLATELETS: 159 10*3/uL (ref 150–400)
RBC: 3.5 MIL/uL — AB (ref 4.22–5.81)
RDW: 13.5 % (ref 11.5–15.5)
WBC: 9.9 10*3/uL (ref 4.0–10.5)

## 2016-08-16 LAB — COMPREHENSIVE METABOLIC PANEL
ALK PHOS: 42 U/L (ref 38–126)
ALT: 14 U/L — ABNORMAL LOW (ref 17–63)
ANION GAP: 15 (ref 5–15)
AST: 14 U/L — ABNORMAL LOW (ref 15–41)
Albumin: 3.2 g/dL — ABNORMAL LOW (ref 3.5–5.0)
BILIRUBIN TOTAL: 0.8 mg/dL (ref 0.3–1.2)
BUN: 40 mg/dL — ABNORMAL HIGH (ref 6–20)
CALCIUM: 7.4 mg/dL — AB (ref 8.9–10.3)
CO2: 24 mmol/L (ref 22–32)
Chloride: 94 mmol/L — ABNORMAL LOW (ref 101–111)
Creatinine, Ser: 11.33 mg/dL — ABNORMAL HIGH (ref 0.61–1.24)
GFR calc Af Amer: 6 mL/min — ABNORMAL LOW (ref >60)
GFR, EST NON AFRICAN AMERICAN: 5 mL/min — AB (ref >60)
GLUCOSE: 87 mg/dL (ref 65–99)
Potassium: 3.4 mmol/L — ABNORMAL LOW (ref 3.5–5.1)
Sodium: 133 mmol/L — ABNORMAL LOW (ref 135–145)
TOTAL PROTEIN: 6.5 g/dL (ref 6.5–8.1)

## 2016-08-16 LAB — GLUCOSE, CAPILLARY: Glucose-Capillary: 82 mg/dL (ref 65–99)

## 2016-08-16 SURGERY — EGD (ESOPHAGOGASTRODUODENOSCOPY)
Anesthesia: Monitor Anesthesia Care

## 2016-08-16 SURGERY — ESOPHAGOGASTRODUODENOSCOPY (EGD) WITH PROPOFOL
Anesthesia: Monitor Anesthesia Care

## 2016-08-16 MED ORDER — PROPOFOL 500 MG/50ML IV EMUL
INTRAVENOUS | Status: DC | PRN
  Administered 2016-08-16: 75 ug/kg/min via INTRAVENOUS

## 2016-08-16 MED ORDER — METOCLOPRAMIDE HCL 5 MG PO TABS: 5.0000 mg | ORAL_TABLET | Freq: Two times a day (BID) | ORAL | 0 refills | Status: DC

## 2016-08-16 MED ORDER — SODIUM CHLORIDE 0.9 % IV SOLN
INTRAVENOUS | Status: DC
  Administered 2016-08-16 (×2): via INTRAVENOUS

## 2016-08-16 MED ORDER — EPHEDRINE SULFATE 50 MG/ML IJ SOLN
INTRAMUSCULAR | Status: DC | PRN
  Administered 2016-08-16: 10 mg via INTRAVENOUS

## 2016-08-16 MED ORDER — PROPOFOL 10 MG/ML IV BOLUS
INTRAVENOUS | Status: DC | PRN
  Administered 2016-08-16 (×2): 20 mg via INTRAVENOUS

## 2016-08-16 MED ORDER — PHENYLEPHRINE HCL 10 MG/ML IJ SOLN
INTRAMUSCULAR | Status: DC | PRN
  Administered 2016-08-16: 120 ug via INTRAVENOUS
  Administered 2016-08-16: 80 ug via INTRAVENOUS

## 2016-08-16 NOTE — Progress Notes (Signed)
Reviewed discharge instructions and medications with patient; all questions answered. Assessment is as charted. IV removed and hemostasis achieved. Patient denies complaints.  Leaving unit in stable condition via wheelchair.

## 2016-08-16 NOTE — Discharge Summary (Signed)
Discharge Summary  [name]:[id number] DOB: [birth year]  PCP: Philis Fendt, MD  Admit date: 08/13/2016 Discharge date: 08/16/2016  Time spent: <54mins  Recommendations for Outpatient Follow-up:  1. F/u with PMD within a week  for hospital discharge follow up, repeat cbc/bmp at follow up 2. F/u with gi  3. F/u with nephrology , continue dialysis MWF  Discharge Diagnoses:  Active Hospital Problems   Diagnosis Date Noted  . GIB (gastrointestinal bleeding) 08/14/2016  . GERD (gastroesophageal reflux disease) 08/14/2016  . Essential hypertension 08/14/2016  . SIRS (systemic inflammatory response syndrome) ([place]) 08/14/2016  . Non-intractable vomiting with nausea 06/11/2016  . Abdominal pain, epigastric 12/05/2014  . Morbid obesity, weight 291, BMI - 47 02/15/2014  . ESRD on dialysis [facility]) 07/12/2013    Resolved Hospital Problems   Diagnosis Date Noted Date Resolved  No resolved problems to display.    Discharge Condition: stable  Diet recommendation: renal diet  Filed Weights   08/14/16 1635 08/15/16 2116 08/16/16 0855  Weight: (!) 140.3 kg (309 lb 4.9 oz) (!) 142.5 kg (314 lb 1.6 oz) (!) 142.5 kg (314 lb 1.6 oz)    History of present illness:  PCP: Philis Fendt, MD   Patient coming from:  The patient is coming from home.  At baseline, pt is independent for most of ADL.   Chief Complaint: Hematemesis, nausea, vomiting and abdominal pain  HPI: [name] is a 31 y.o. male with medical history significant of s/p of gastric banding, hypertension, GERD, chronic back pain, obesity, ESRD-HD, who presents with hematemesis, nausea, vomiting and abdominal pain.  Patient states that she started having abdominal pain, nausea and vomiting since yesterday. He vomited coffee ground material 3 times. No diarrhea. His abdominal pain is located in the epigastric area, constant, 3 out of 10 in severity, nonradiating. Patient does not have fever or chills. No dark  stool. He does not taking ibuprofen. He uses Voltaren gel for pain. He drinks alcohol occasionally. He does not have chest pain, dizziness or lightheadedness. Patient states that he had negative EGD on 2016. No colonoscopy in the past. Patient does not have cough, shortness rest, symptoms of UTI or unilateral weakness.  ED Course: pt was found to have hemoglobin 14.0, WBC 13.7, lactate 4.87-->2.016, negative lipase, potassium 3.6, bicarbonate of 21, creatinine 11.4, temperature normal, tachycardia, tachypnea, O2 sat 100% on room air, negative CT abdomen/pelvis. Blood pressure was soft. Patient is admitted to telemetry bed as inpatient.  Hospital Course:  Principal Problem:   GIB (gastrointestinal bleeding) Active Problems:   ESRD on dialysis ([place])   Morbid obesity, weight 291, BMI - 47   Abdominal pain, epigastric   Non-intractable vomiting with nausea   GERD (gastroesophageal reflux disease)   Essential hypertension   SIRS (systemic inflammatory response syndrome) ([facility])    N/v/abdomina pain Ctab /pel no acute findings. Lft/lipase unremarkable No diarrhea, no fever Cyclic n/v  EGD on 8/29 with retained gastric fluids, gastritis, duodenitis, s/p biopsy,  On ppi gastroparesis diet per GI , close follow up with gi outpatient  dehydration with leukocytosis, lactic acidosis on presentation, resolved  ESRD on HD, nephrology consulted  Hypotension:asymptomatic  Chronic per nephrogy   Morbid obesity: Body mass index is 49.92 kg/m.   Code Status: full  Family Communication: patient   Disposition Plan: home   Consultants:  [facility]  nephrology  Procedures:  egd on 1/14  Antibiotics:  none   Discharge Exam: BP (!) 119/44   Pulse 83  Temp 97.7 F (36.5 C) (Oral)   Resp (!) 31   Ht 5\' 6"  (1.676 m)   Wt (!) 142.5 kg (314 lb 1.6 oz)   SpO2 100%   BMI 50.70 kg/m     General:  NAD, obese  Cardiovascular: RRR  Respiratory: CTABL  Abdomen:  Soft/ND/NT, positive BS  Musculoskeletal: No Edema  Neuro: aaox3  Discharge Instructions You were cared for by a hospitalist during your hospital stay. If you have any questions about your discharge medications or the care you received while you were in the hospital after you are discharged, you can call the unit and asked to speak with the hospitalist on call if the hospitalist that took care of you is not available. Once you are discharged, your primary care physician will handle any further medical issues. Please note that NO REFILLS for any discharge medications will be authorized once you are discharged, as it is imperative that you return to your primary care physician (or establish a relationship with a primary care physician if you do not have one) for your aftercare needs so that they can reassess your need for medications and monitor your lab values.  Discharge Instructions    Diet - low sodium heart healthy    Complete by:  As directed    Renal diet and gastroparesis diet   Increase activity slowly    Complete by:  As directed      Allergies as of 08/16/2016      Reactions   Dilaudid [hydromorphone Hcl] Other (See Comments)   Patient became hypoxic to 60% with dilaudid 1mg  IV      Medication List    STOP taking these medications   docusate sodium 100 MG capsule Commonly known as:  COLACE     TAKE these medications   albuterol 108 (90 Base) MCG/ACT inhaler Commonly known as:  PROVENTIL HFA;VENTOLIN HFA Inhale 2 puffs into the lungs every 4 (four) hours as needed for wheezing or shortness of breath.   calcium carbonate 500 MG chewable tablet Commonly known as:  TUMS - dosed in mg elemental calcium Chew 2 tablets by mouth 2 (two) times daily as needed for indigestion or heartburn.   glycopyrrolate 2 MG tablet Commonly known as:  ROBINUL Take 1 tablet (2 mg total) by mouth 2 (two) times daily.   Hyoscyamine Sulfate 0.375 MG Tbcr Take 1 tablet twice daily     lidocaine 2 % jelly Commonly known as:  XYLOCAINE Apply 1 application topically as needed. What changed:  when to take this  reasons to take this   methocarbamol 500 MG tablet Commonly known as:  ROBAXIN Take 1 tablet (500 mg total) by mouth every 8 (eight) hours as needed for muscle spasms.   metoCLOPramide 5 MG tablet Commonly known as:  REGLAN Take 1 tablet (5 mg total) by mouth 2 (two) times daily before a meal.   multivitamin Tabs tablet Take 1 tablet by mouth daily.   omeprazole 20 MG capsule Commonly known as:  PRILOSEC Take 1 capsule (20 mg total) by mouth daily.   ondansetron 4 MG tablet Commonly known as:  ZOFRAN Take 1 tablet (4 mg total) by mouth every 6 (six) hours.   promethazine 25 MG suppository Commonly known as:  PHENERGAN Place 1 suppository (25 mg total) rectally every 6 (six) hours as needed.   sevelamer carbonate 2.4 g Pack Commonly known as:  RENVELA Take 2.4 g by mouth 3 (three) times daily with meals.  traMADol 50 MG tablet Commonly known as:  ULTRAM Take 1 tablet (50 mg total) by mouth every 8 (eight) hours as needed. What changed:  when to take this  reasons to take this   VOLTAREN 1 % Gel Generic drug:  diclofenac sodium Apply 4 g topically 4 (four) times daily. What changed:  when to take this  reasons to take this      Allergies  Allergen Reactions  . Dilaudid [Hydromorphone Hcl] Other (See Comments)    Patient became hypoxic to 60% with dilaudid 1mg  IV   Follow-up Information    Nelida Meuse III, MD Follow up in 1 week(s).   Specialty:  Gastroenterology Why:  hospital discharge follow up for egd biopsy result and gastroparesis management Contact information: [address] [place] [address] [phone]            The results of significant diagnostics from this hospitalization (including imaging, microbiology, ancillary and laboratory) are listed below for reference.    Significant Diagnostic  Studies: Ct Abdomen Pelvis Wo Contrast  Result Date: 08/13/2016 CLINICAL DATA:  Generalized abdomen pain, nausea vomiting. EXAM: CT ABDOMEN AND PELVIS WITHOUT CONTRAST TECHNIQUE: Multidetector CT imaging of the abdomen and pelvis was performed following the standard protocol without IV contrast. COMPARISON:  July 27, 2016 FINDINGS: Lower chest: No acute abnormality. Hepatobiliary: No focal liver abnormality is seen. No gallstones, gallbladder wall thickening, or biliary dilatation. Pancreas: Unremarkable. No pancreatic ductal dilatation or surrounding inflammatory changes. Spleen: Normal in size without focal abnormality. Adrenals/Urinary Tract: The adrenal glands are normal. The bilateral kidneys are atrophic. There multiple bilateral kidney cysts. There is no hydronephrosis bilaterally. Nonobstructing right kidney stones are noted. The bladder is decompressed limiting evaluation. Stomach/Bowel: There is a small hiatal hernia. Appendix appears normal. No evidence of bowel wall thickening, distention, or inflammatory changes. Vascular/Lymphatic: No significant vascular findings are present. No enlarged abdominal or pelvic lymph nodes. Reproductive: Prostate is unremarkable. Other: No abdominal wall hernia or abnormality. No abdominopelvic ascites. Musculoskeletal: No acute or significant osseous findings. IMPRESSION: No acute abnormality.  No bowel obstruction. Bilateral atrophic kidneys with kidney cysts. Electronically Signed   By: Abelardo Diesel M.D.   On: 08/13/2016 18:53   Dg Chest 2 View  Result Date: 07/27/2016 CLINICAL DATA:  Shortness of breath.  Abdominal pain. EXAM: CHEST  2 VIEW COMPARISON:  Radiographs 06/11/2016 FINDINGS: Low lung volumes leading to bronchovascular crowding. Unchanged heart size at the upper limits normal. No focal airspace disease, pulmonary edema, pleural effusion or pneumothorax. Surgical clips at the thoracic inlet. Ingested material within the stomach. No acute osseous  abnormality. Diffuse increased bone mineral density, stable. IMPRESSION: Hypoventilatory chest without acute abnormality. Electronically Signed   By: Jeb Levering M.D.   On: 07/27/2016 01:18   Ct Abdomen Pelvis W Contrast  Result Date: 07/27/2016 CLINICAL DATA:  Acute abdominal pain and vomiting head history a hiatal hernia, gastric banding, end-stage renal disease and hernia repair. On common ETT hemodialysis. EXAM: CT ABDOMEN AND PELVIS WITH CONTRAST TECHNIQUE: Multidetector CT imaging of the abdomen and pelvis was performed using the standard protocol following bolus administration of intravenous contrast. CONTRAST:  142mL ISOVUE-300 IOPAMIDOL (ISOVUE-300) INJECTION 61% COMPARISON:  12/27/2014 CT FINDINGS: Lower chest: Small hiatal hernia. Normal visualized cardiac chamber size. Minimal bibasilar dependent atelectasis. No effusion or pneumothorax. Hepatobiliary: The liver enhances homogeneously. There is no biliary dilatation. The gallbladder is unremarkable. Pancreas: Normal Spleen: Normal Adrenals/Urinary Tract: Normal adrenal glands. Atrophic multi-cystic kidneys with nonobstructing  right lower pole calculus. Contracted bladder. Stomach/Bowel: The stomach is moderately distended. The distal gastric body and antrum is somewhat narrowed in appearance without significant thickening. Nonemergent upper GI study may help to assure that this area is not a fixed abnormality and that the stomach is distensible and not related to an infiltrative/neoplastic abnormality of the stomach. There is normal small bowel rotation. Mild diffuse colonic spasm is noted along the ascending through transverse colon. Normal appearing appendix. Vascular/Lymphatic: No significant vascular findings are present. No enlarged abdominal or pelvic lymph nodes. Reproductive: Prostate is unremarkable. Other: No abdominal wall hernia or abnormality. No abdominopelvic ascites. Musculoskeletal: Diffuse osteosclerotic appearance of the  dorsal spine consistent with renal osteodystrophy. IMPRESSION: 1. Proximal gastric distention with nondistended, narrowed appearing gastric antrum. No definite thickening of the gastric antrum. Recommend nonemergent upper GI study to assure distention of the distal stomach and to exclude an infiltrative abnormality such as linitis of the stomach accounting for this appearance. 2. Polycystic renal disease with atrophy as before. Renal osteodystrophy. 3. Small hiatal hernia with refluxed material seen within the distal esophagus. Electronically Signed   By: Ashley Royalty M.D.   On: 07/27/2016 01:57    Microbiology: Recent Results (from the past 240 hour(s))  Culture, blood (Routine X 2) w Reflex to ID Panel     Status: None (Preliminary result)   Collection Time: 08/14/16  1:35 AM  Result Value Ref Range Status   Specimen Description BLOOD BLOOD RIGHT FOREARM  Final   Special Requests BOTTLES DRAWN AEROBIC AND ANAEROBIC 5CC  Final   Culture NO GROWTH 2 DAYS  Final   Report Status PENDING  Incomplete  Culture, blood (Routine X 2) w Reflex to ID Panel     Status: None (Preliminary result)   Collection Time: 08/14/16  1:43 AM  Result Value Ref Range Status   Specimen Description BLOOD RIGHT HAND  Final   Special Requests BOTTLES DRAWN AEROBIC ONLY 5CC  Final   Culture NO GROWTH 2 DAYS  Final   Report Status PENDING  Incomplete  MRSA PCR Screening     Status: None   Collection Time: 08/14/16  2:41 AM  Result Value Ref Range Status   MRSA by PCR NEGATIVE NEGATIVE Final    Comment:        The GeneXpert MRSA Assay (FDA approved for NASAL specimens only), is one component of a comprehensive MRSA colonization surveillance program. It is not intended to diagnose MRSA infection nor to guide or monitor treatment for MRSA infections.      Labs: Basic Metabolic Panel:  Recent Labs Lab 08/12/16 2317 08/13/16 1957 08/13/16 2002 08/14/16 0650 08/16/16 0612  NA 136 137 135 137 133*  K 3.1*  3.6 3.4* 3.8 3.4*  CL 93* 93* 98* 96* 94*  CO2 20* 21*  --  21* 24  GLUCOSE 111* 105* 107* 94 87  BUN 26* 43* 44* 51* 40*  CREATININE 8.11* 11.43* 12.30* 12.93* 11.33*  CALCIUM 9.5 8.5*  --  7.6* 7.4*   Liver Function Tests:  Recent Labs Lab 08/12/16 2317 08/13/16 1957 08/16/16 0612  AST 21 15 14*  ALT 21 18 14*  ALKPHOS 64 55 42  BILITOT 1.1 1.2 0.8  PROT 10.0* 9.0* 6.5  ALBUMIN 4.9 4.6 3.2*    Recent Labs Lab 08/12/16 2317 08/13/16 1957  LIPASE 30 51   No results for input(s): AMMONIA in the last 168 hours. CBC:  Recent Labs Lab 08/13/16 1957 08/13/16 2002 08/14/16 0041 08/14/16  0109 08/15/16 0717 08/16/16 0612  WBC 13.7*  --  12.9* 12.4* 10.1 9.9  NEUTROABS 9.9*  --   --   --   --  4.7  HGB 14.0 13.9 13.9 12.2* 11.6* 11.1*  HCT 40.2 41.0 39.3 35.9* 33.6* 31.7*  MCV 90.5  --  90.8 92.1 91.1 90.6  PLT 236  --  211 180 173 159   Cardiac Enzymes: No results for input(s): CKTOTAL, CKMB, CKMBINDEX, TROPONINI in the last 168 hours. BNP: BNP (last 3 results) No results for input(s): BNP in the last 8760 hours.  ProBNP (last 3 results) No results for input(s): PROBNP in the last 8760 hours.  CBG:  Recent Labs Lab 08/14/16 0801 08/15/16 0004 08/16/16 0830  GLUCAP 105* 92 82       Signed:  Cristal Qadir MD, PhD  [facility] 08/16/2016, 12:46 PM

## 2016-08-16 NOTE — Anesthesia Preprocedure Evaluation (Signed)
Anesthesia Evaluation  Patient identified by MRN, date of birth, ID band Patient awake    Reviewed: Allergy & Precautions, NPO status , Patient's Chart, lab work & pertinent test results  Airway Mallampati: II  TM Distance: <3 FB Neck ROM: Full    Dental no notable dental hx.    Pulmonary neg pulmonary ROS, former smoker,    breath sounds clear to auscultation + decreased breath sounds      Cardiovascular hypertension, Normal cardiovascular exam Rhythm:Regular Rate:Normal     Neuro/Psych negative neurological ROS  negative psych ROS   GI/Hepatic Neg liver ROS, GERD  ,  Endo/Other  negative endocrine ROS  Renal/GU DialysisRenal disease  negative genitourinary   Musculoskeletal negative musculoskeletal ROS (+)   Abdominal   Peds negative pediatric ROS (+)  Hematology  (+) anemia ,   Anesthesia Other Findings   Reproductive/Obstetrics negative OB ROS                             Anesthesia Physical Anesthesia Plan  ASA: IV  Anesthesia Plan: MAC   Post-op Pain Management:    Induction: Intravenous  Airway Management Planned: Nasal Cannula  Additional Equipment:   Intra-op Plan:   Post-operative Plan:   Informed Consent: I have reviewed the patients History and Physical, chart, labs and discussed the procedure including the risks, benefits and alternatives for the proposed anesthesia with the patient or authorized representative who has indicated his/her understanding and acceptance.   Dental advisory given  Plan Discussed with: CRNA and Surgeon  Anesthesia Plan Comments:         Anesthesia Quick Evaluation

## 2016-08-16 NOTE — Progress Notes (Addendum)
[facility] Progress Note  Dialysis Orders:  [facility] MWFSat 4:45 hours, EDW 137.5kg, 2K/2.5Ca, AVF, BFR 450/DFR 800 - Calcitriol 0.49mcg PO q HD - No ESA or IV iron - Heparin 12,000 units bolus   Assessment: 1. Abdominal pain/nausea -- EGD with gastritis/ duodenitis and retained fluid in stomach c/w gastroparesis - biopsied, gastroparesis diet recommended by GI   2.  ESRD -  MWF during admit  3.  Hypotension - chronic hypotension, asymptomatic - does not tolerate midodrine  4. Volume  - HD 1/12 with no UF and no signs of volume overload , no edema lungs clear -  believe he has gained body mass will raise EDW ~139 kg  5.  Anemia  - Hgb 11.6, follow no OP ESA  6.  Metabolic bone disease -  Cont VDRA/Renvela Tums  7.  Nutrition - renal diet/vitamins   Plan - for d/c today - next HD 1/15, raise EDW   Lynnda Child [name] [place] Pager [phone] 08/16/2016,1:17 PM  LOS: 2 days   Pt seen, examined and agree w A/P as above.  Kelly Splinter MD [facility] pager [phone]   08/16/2016, 3:00 PM      Subjective:  No further N/V on admit. EGD this morning with gastritis  ready to go home   Objective Vitals:   08/16/16 1008 08/16/16 1015 08/16/16 1020 08/16/16 1030  BP: (!) 82/32 (!) 105/37 115/72 (!) 119/44  Pulse: 85 85 79 83  Resp: 15 (!) 24 19 (!) 31  Temp: 97.7 F (36.5 C)     TempSrc: Oral     SpO2: 97% 98% 99% 100%  Weight:      Height:       Physical Exam General: Obese AAM NAD Heart: RRR Lungs: CTAB  Abdomen: soft NT Bs+  Extremities: no LE edema  Dialysis Access: LUE AVF+bruit   Additional Objective Labs: Basic Metabolic Panel:  Recent Labs Lab 08/13/16 1957 08/13/16 2002 08/14/16 0650 08/16/16 0612  NA 137 135 137 133*  K 3.6 3.4* 3.8 3.4*  CL 93* 98* 96* 94*  CO2 21*  --  21* 24  GLUCOSE 105* 107* 94 87  BUN 43* 44* 51* 40*  CREATININE 11.43* 12.30* 12.93* 11.33*  CALCIUM 8.5*  --  7.6* 7.4*    Liver Function Tests:  Recent Labs Lab 08/12/16 2317 08/13/16 1957 08/16/16 0612  AST 21 15 14*  ALT 21 18 14*  ALKPHOS 64 55 42  BILITOT 1.1 1.2 0.8  PROT 10.0* 9.0* 6.5  ALBUMIN 4.9 4.6 3.2*    Recent Labs Lab 08/12/16 2317 08/13/16 1957  LIPASE 30 51   CBC:  Recent Labs Lab 08/13/16 1957  08/14/16 0041 08/14/16 0650 08/15/16 0717 08/16/16 0612  WBC 13.7*  --  12.9* 12.4* 10.1 9.9  NEUTROABS 9.9*  --   --   --   --  4.7  HGB 14.0  < > 13.9 12.2* 11.6* 11.1*  HCT 40.2  < > 39.3 35.9* 33.6* 31.7*  MCV 90.5  --  90.8 92.1 91.1 90.6  PLT 236  --  211 180 173 159  < > = values in this interval not displayed. Blood Culture    Component Value Date/Time   SDES BLOOD RIGHT HAND 08/14/2016 0143   SPECREQUEST BOTTLES DRAWN AEROBIC ONLY 5CC 08/14/2016 0143   CULT NO GROWTH 2 DAYS 08/14/2016 0143   REPTSTATUS PENDING 08/14/2016 0143    Cardiac Enzymes: No results for input(s): CKTOTAL,  CKMB, CKMBINDEX, TROPONINI in the last 168 hours. CBG:  Recent Labs Lab 08/14/16 0801 08/15/16 0004 08/16/16 0830  GLUCAP 105* 92 82   Iron Studies: No results for input(s): IRON, TIBC, TRANSFERRIN, FERRITIN in the last 72 hours. Lab Results  Component Value Date   INR 1.12 08/14/2016   INR 1.07 03/27/2014   Medications:  . calcitRIOL  0.5 mcg Oral Q M,W,F-HD  . docusate sodium  100 mg Oral BID  . metoCLOPramide (REGLAN) injection  5 mg Intravenous Q6H  . multivitamin  1 tablet Oral QHS  . ondansetron (ZOFRAN) IV  4 mg Intravenous Q6H  . pantoprazole (PROTONIX) IV  40 mg Intravenous Q24H  . sevelamer carbonate  2.4 g Oral TID WC  . sodium chloride flush  3 mL Intravenous Q12H

## 2016-08-16 NOTE — Anesthesia Postprocedure Evaluation (Addendum)
Anesthesia Post Note  Patient: [name]  Procedure(s) Performed: Procedure(s) (LRB): ESOPHAGOGASTRODUODENOSCOPY (EGD) (N/A)  Patient location during evaluation: PACU Anesthesia Type: MAC Level of consciousness: awake and alert Pain management: pain level controlled Vital Signs Assessment: post-procedure vital signs reviewed and stable Respiratory status: spontaneous breathing, nonlabored ventilation, respiratory function stable and patient connected to nasal cannula oxygen Cardiovascular status: stable and blood pressure returned to baseline Anesthetic complications: no       Last Vitals:  Vitals:   08/16/16 1020 08/16/16 1030  BP: 115/72 (!) 119/44  Pulse: 79 83  Resp: 19 (!) 31  Temp:      Last Pain:  Vitals:   08/16/16 1008  TempSrc: Oral  PainSc:                  Kwinton Maahs S

## 2016-08-16 NOTE — Anesthesia Preprocedure Evaluation (Deleted)
Anesthesia Evaluation  Patient identified by MRN, date of birth, ID band Patient awake    Reviewed: Allergy & Precautions, NPO status , Patient's Chart, lab work & pertinent test results  Airway Mallampati: II  TM Distance: <3 FB Neck ROM: Full    Dental no notable dental hx.    Pulmonary neg pulmonary ROS, former smoker,    breath sounds clear to auscultation + decreased breath sounds      Cardiovascular hypertension, Normal cardiovascular exam Rhythm:Regular Rate:Normal     Neuro/Psych negative neurological ROS  negative psych ROS   GI/Hepatic negative GI ROS, Neg liver ROS, GERD  ,  Endo/Other  negative endocrine ROSMorbid obesity  Renal/GU DialysisRenal disease  negative genitourinary   Musculoskeletal negative musculoskeletal ROS (+)   Abdominal   Peds negative pediatric ROS (+)  Hematology negative hematology ROS (+)   Anesthesia Other Findings   Reproductive/Obstetrics negative OB ROS                             Anesthesia Physical Anesthesia Plan  ASA: IV  Anesthesia Plan: MAC   Post-op Pain Management:    Induction: Intravenous  Airway Management Planned: Nasal Cannula  Additional Equipment:   Intra-op Plan:   Post-operative Plan:   Informed Consent: I have reviewed the patients History and Physical, chart, labs and discussed the procedure including the risks, benefits and alternatives for the proposed anesthesia with the patient or authorized representative who has indicated his/her understanding and acceptance.   Dental advisory given  Plan Discussed with: CRNA and Surgeon  Anesthesia Plan Comments:         Anesthesia Quick Evaluation

## 2016-08-16 NOTE — Transfer of Care (Signed)
Immediate Anesthesia Transfer of Care Note  Patient: [name]  Procedure(s) Performed: Procedure(s): ESOPHAGOGASTRODUODENOSCOPY (EGD) (N/A)  Patient Location: PACU and Endoscopy Unit  Anesthesia Type:MAC  Level of Consciousness: awake, alert , oriented and patient cooperative  Airway & Oxygen Therapy: Patient Spontanous Breathing and Patient connected to nasal cannula oxygen  Post-op Assessment: Report given to RN and Post -op Vital signs reviewed and stable  Post vital signs: Reviewed and stable  Last Vitals:  Vitals:   08/16/16 0450 08/16/16 0855  BP: (!) 94/50 (!) 97/57  Pulse: 69 66  Resp: 18 15  Temp: 36.7 C 36.5 C    Last Pain:  Vitals:   08/16/16 0855  TempSrc: Oral  PainSc:          Complications: No apparent anesthesia complications   Resting on left side. Talking. Denies SOB.  Saturation 99% on 2l [place].  Tolerated EGD well.

## 2016-08-16 NOTE — Op Note (Signed)
[facility] Patient Name: [name] Procedure Date : 08/16/2016 MRN: [id number] Attending MD: Jerene Bears , MD Date of Birth: [birth year] CSN: [id number] Age: 31 Admit Type: Inpatient Procedure:                Upper GI endoscopy Indications:              Epigastric abdominal pain, recurrent and cyclic,                            episodic nausea with vomiting Providers:                Lajuan Lines. Hilarie Fredrickson, MD, Vista Lawman, RN, Elspeth Cho                            Tech., Technician Referring MD:             [facility] Medicines:                Monitored Anesthesia Care Complications:            No immediate complications. Estimated Blood Loss:     Estimated blood loss was minimal. Procedure:                Pre-Anesthesia Assessment:                           - Prior to the procedure, a History and Physical                            was performed, and patient medications and                            allergies were reviewed. The patient's tolerance of                            previous anesthesia was also reviewed. The risks                            and benefits of the procedure and the sedation                            options and risks were discussed with the patient.                            All questions were answered, and informed consent                            was obtained. Prior Anticoagulants: The patient has                            taken no previous anticoagulant or antiplatelet                            agents. ASA Grade Assessment: III - A patient with  severe systemic disease. After reviewing the risks                            and benefits, the patient was deemed in                            satisfactory condition to undergo the procedure.                           After obtaining informed consent, the endoscope was                            passed under direct vision. Throughout the             procedure, the patient's blood pressure, pulse, and                            oxygen saturations were monitored continuously. The                            EG-[id number] ([id number]) scope was introduced through the                            mouth, and advanced to the second part of duodenum.                            The upper GI endoscopy was accomplished without                            difficulty. The patient tolerated the procedure                            well. Scope In: Scope Out: Findings:      The examined esophagus was normal. Z-line is regular at 38 cm.      The cardia and gastric fundus were normal on retroflexion.      Retained fluid was found in the gastric body which was cleared with       suction.      Patchy mild inflammation characterized by erosions and granularity was       found in the gastric antrum and in the prepyloric region of the stomach.       Biopsies were taken from the gastric body, antrum, prepyloric stomach,       and incisura with a cold forceps for histology and Helicobacter pylori       testing.      Moderate inflammation characterized by erythema and nodularity was found       in the duodenal bulb and duodenal sweep. Multiple biopsies were obtained       with cold forceps for histology in the duodenal bulb.      The second portion of the duodenum was normal. Impression:               - Normal esophagus.                           - Retained gastric fluid cleared with suction.                           -  Gastritis. Biopsied.                           - Duodenitis. Biopsied.                           - Normal second portion of the duodenum. Moderate Sedation:      N/A Recommendation:           - Return patient to hospital ward for ongoing care.                           - Advance diet as tolerated with goal of                            gastroparesis diet.                           - Continue present medications.                            - Await pathology results.                           - Follow-up with Dr. Loletha Carrow recommended after                            discharge for continuity after hospitalization. Procedure Code(s):        --- Professional ---                           [id number], Esophagogastroduodenoscopy, flexible,                            transoral; with biopsy, single or multiple Diagnosis Code(s):        --- Professional ---                           K29.70, Gastritis, unspecified, without bleeding                           K29.80, Duodenitis without bleeding                           R10.13, Epigastric pain                           R11.2, Nausea with vomiting, unspecified CPT copyright 2016 [employer]. All rights reserved. The codes documented in this report are preliminary and upon coder review may  be revised to meet current compliance requirements. Jerene Bears, MD 08/16/2016 10:11:34 AM This report has been signed electronically. Number of Addenda: 0

## 2016-08-17 ENCOUNTER — Telehealth: Payer: Self-pay

## 2016-08-17 ENCOUNTER — Encounter (HOSPITAL_COMMUNITY): Payer: Self-pay | Admitting: Internal Medicine

## 2016-08-17 NOTE — Telephone Encounter (Signed)
Called patient, I had an open appointment on 1/18 but patient could not make that appointment date or time. He is scheduled at next available visit on 1/30.

## 2016-08-17 NOTE — Telephone Encounter (Signed)
-----   Message from Jerene Bears, MD sent at 08/16/2016 10:28 AM EST ----- Regarding: [facility] followup Needs OV with [facility] after hospitalization Thanks JMP

## 2016-08-19 LAB — CULTURE, BLOOD (ROUTINE X 2)
Culture: NO GROWTH
Culture: NO GROWTH

## 2016-08-20 ENCOUNTER — Ambulatory Visit: Payer: Self-pay | Admitting: Gastroenterology

## 2016-09-01 ENCOUNTER — Other Ambulatory Visit: Payer: Self-pay

## 2016-09-01 ENCOUNTER — Ambulatory Visit: Payer: Self-pay | Admitting: Gastroenterology

## 2016-10-01 ENCOUNTER — Ambulatory Visit: Payer: Self-pay | Admitting: Gastroenterology

## 2016-10-08 ENCOUNTER — Encounter: Payer: Medicare Other | Attending: Physical Medicine & Rehabilitation

## 2016-10-08 ENCOUNTER — Observation Stay (HOSPITAL_COMMUNITY)
Admission: EM | Admit: 2016-10-08 | Discharge: 2016-10-09 | Disposition: A | Discharge status: Home / Self Care | Payer: Medicare Other | Attending: Family Medicine | Admitting: Family Medicine

## 2016-10-08 ENCOUNTER — Encounter (HOSPITAL_COMMUNITY): Payer: Self-pay | Admitting: Emergency Medicine

## 2016-10-08 ENCOUNTER — Emergency Department (HOSPITAL_COMMUNITY): Payer: Medicare Other

## 2016-10-08 ENCOUNTER — Ambulatory Visit: Payer: Self-pay | Admitting: Physical Medicine & Rehabilitation

## 2016-10-08 ENCOUNTER — Ambulatory Visit: Payer: Self-pay | Admitting: Gastroenterology

## 2016-10-08 DIAGNOSIS — R1013 Epigastric pain: Secondary | ICD-10-CM | POA: Diagnosis not present

## 2016-10-08 DIAGNOSIS — I120 Hypertensive chronic kidney disease with stage 5 chronic kidney disease or end stage renal disease: Secondary | ICD-10-CM | POA: Diagnosis not present

## 2016-10-08 DIAGNOSIS — Z87891 Personal history of nicotine dependence: Secondary | ICD-10-CM | POA: Diagnosis not present

## 2016-10-08 DIAGNOSIS — R1115 Cyclical vomiting syndrome unrelated to migraine: Secondary | ICD-10-CM

## 2016-10-08 DIAGNOSIS — I10 Essential (primary) hypertension: Secondary | ICD-10-CM | POA: Diagnosis not present

## 2016-10-08 DIAGNOSIS — G43A1 Cyclical vomiting, intractable: Principal | ICD-10-CM | POA: Insufficient documentation

## 2016-10-08 DIAGNOSIS — K219 Gastro-esophageal reflux disease without esophagitis: Secondary | ICD-10-CM | POA: Diagnosis not present

## 2016-10-08 DIAGNOSIS — Z992 Dependence on renal dialysis: Secondary | ICD-10-CM

## 2016-10-08 DIAGNOSIS — Z96651 Presence of right artificial knee joint: Secondary | ICD-10-CM | POA: Diagnosis not present

## 2016-10-08 DIAGNOSIS — N186 End stage renal disease: Secondary | ICD-10-CM | POA: Insufficient documentation

## 2016-10-08 DIAGNOSIS — R112 Nausea with vomiting, unspecified: Secondary | ICD-10-CM | POA: Diagnosis not present

## 2016-10-08 DIAGNOSIS — R1011 Right upper quadrant pain: Secondary | ICD-10-CM | POA: Diagnosis not present

## 2016-10-08 DIAGNOSIS — K3184 Gastroparesis: Secondary | ICD-10-CM

## 2016-10-08 DIAGNOSIS — Z79899 Other long term (current) drug therapy: Secondary | ICD-10-CM | POA: Insufficient documentation

## 2016-10-08 LAB — COMPREHENSIVE METABOLIC PANEL
ALBUMIN: 4.3 g/dL (ref 3.5–5.0)
ALK PHOS: 57 U/L (ref 38–126)
ALT: 18 U/L (ref 17–63)
AST: 15 U/L (ref 15–41)
Anion gap: 21 — ABNORMAL HIGH (ref 5–15)
BUN: 38 mg/dL — ABNORMAL HIGH (ref 6–20)
CHLORIDE: 93 mmol/L — AB (ref 101–111)
CO2: 25 mmol/L (ref 22–32)
CREATININE: 9.81 mg/dL — AB (ref 0.61–1.24)
Calcium: 9.1 mg/dL (ref 8.9–10.3)
GFR calc non Af Amer: 6 mL/min — ABNORMAL LOW (ref >60)
GFR, EST AFRICAN AMERICAN: 7 mL/min — AB (ref >60)
GLUCOSE: 71 mg/dL (ref 65–99)
Potassium: 3.9 mmol/L (ref 3.5–5.1)
SODIUM: 139 mmol/L (ref 135–145)
Total Bilirubin: 0.8 mg/dL (ref 0.3–1.2)
Total Protein: 8.7 g/dL — ABNORMAL HIGH (ref 6.5–8.1)

## 2016-10-08 LAB — CBC WITH DIFFERENTIAL/PLATELET
BASOS ABS: 0 10*3/uL (ref 0.0–0.1)
BASOS PCT: 0 %
EOS ABS: 0 10*3/uL (ref 0.0–0.7)
EOS PCT: 0 %
HCT: 44.6 % (ref 39.0–52.0)
HEMOGLOBIN: 15.6 g/dL (ref 13.0–17.0)
LYMPHS ABS: 1.2 10*3/uL (ref 0.7–4.0)
Lymphocytes Relative: 12 %
MCH: 31.8 pg (ref 26.0–34.0)
MCHC: 35 g/dL (ref 30.0–36.0)
MCV: 91 fL (ref 78.0–100.0)
Monocytes Absolute: 0.6 10*3/uL (ref 0.1–1.0)
Monocytes Relative: 6 %
NEUTROS PCT: 82 %
Neutro Abs: 8.3 10*3/uL — ABNORMAL HIGH (ref 1.7–7.7)
PLATELETS: 211 10*3/uL (ref 150–400)
RBC: 4.9 MIL/uL (ref 4.22–5.81)
RDW: 14.3 % (ref 11.5–15.5)
WBC: 10.2 10*3/uL (ref 4.0–10.5)

## 2016-10-08 LAB — I-STAT CHEM 8, ED
BUN: 41 mg/dL — AB (ref 6–20)
CHLORIDE: 96 mmol/L — AB (ref 101–111)
CREATININE: 10.2 mg/dL — AB (ref 0.61–1.24)
Calcium, Ion: 0.96 mmol/L — ABNORMAL LOW (ref 1.15–1.40)
Glucose, Bld: 101 mg/dL — ABNORMAL HIGH (ref 65–99)
HEMATOCRIT: 43 % (ref 39.0–52.0)
Hemoglobin: 14.6 g/dL (ref 13.0–17.0)
Potassium: 3.6 mmol/L (ref 3.5–5.1)
SODIUM: 137 mmol/L (ref 135–145)
TCO2: 28 mmol/L (ref 0–100)

## 2016-10-08 LAB — LIPASE, BLOOD: Lipase: 30 U/L (ref 11–51)

## 2016-10-08 MED ORDER — GLYCOPYRROLATE 1 MG PO TABS
2.0000 mg | ORAL_TABLET | Freq: Two times a day (BID) | ORAL | Status: DC
  Administered 2016-10-08 – 2016-10-09 (×2): 2 mg via ORAL
  Filled 2016-10-08 (×2): qty 2

## 2016-10-08 MED ORDER — SODIUM CHLORIDE 0.9 % IV BOLUS (SEPSIS)
1000.0000 mL | Freq: Once | INTRAVENOUS | Status: AC
  Administered 2016-10-08: 1000 mL via INTRAVENOUS

## 2016-10-08 MED ORDER — ENOXAPARIN SODIUM 40 MG/0.4ML ~~LOC~~ SOLN: 40.0000 mg | SUBCUTANEOUS | Status: DC

## 2016-10-08 MED ORDER — ALBUTEROL SULFATE (2.5 MG/3ML) 0.083% IN NEBU: 3.0000 mL | INHALATION_SOLUTION | RESPIRATORY_TRACT | Status: DC | PRN

## 2016-10-08 MED ORDER — PROMETHAZINE HCL 25 MG RE SUPP: 25.0000 mg | Freq: Four times a day (QID) | RECTAL | Status: DC | PRN

## 2016-10-08 MED ORDER — SEVELAMER CARBONATE 2.4 G PO PACK
2.4000 g | PACK | Freq: Three times a day (TID) | ORAL | Status: DC
  Administered 2016-10-09 (×2): 2.4 g via ORAL
  Filled 2016-10-08 (×2): qty 1

## 2016-10-08 MED ORDER — DIPHENHYDRAMINE HCL 50 MG/ML IJ SOLN
12.5000 mg | Freq: Four times a day (QID) | INTRAMUSCULAR | Status: DC | PRN
  Administered 2016-10-08: 12.5 mg via INTRAVENOUS
  Filled 2016-10-08: qty 1

## 2016-10-08 MED ORDER — MORPHINE SULFATE (PF) 4 MG/ML IV SOLN
4.0000 mg | Freq: Once | INTRAVENOUS | Status: AC
Start: 2016-10-08 — End: 2016-10-08
  Administered 2016-10-08: 4 mg via INTRAVENOUS
  Filled 2016-10-08: qty 1

## 2016-10-08 MED ORDER — ONDANSETRON HCL 4 MG/2ML IJ SOLN: 4.0000 mg | Freq: Four times a day (QID) | INTRAMUSCULAR | Status: DC | PRN

## 2016-10-08 MED ORDER — METOCLOPRAMIDE HCL 5 MG/ML IJ SOLN
10.0000 mg | Freq: Once | INTRAMUSCULAR | Status: AC
Start: 2016-10-08 — End: 2016-10-08
  Administered 2016-10-08: 10 mg via INTRAVENOUS
  Filled 2016-10-08: qty 2

## 2016-10-08 MED ORDER — ACETAMINOPHEN 325 MG PO TABS: 650.0000 mg | ORAL_TABLET | Freq: Four times a day (QID) | ORAL | Status: DC | PRN

## 2016-10-08 MED ORDER — ACETAMINOPHEN 650 MG RE SUPP: 650.0000 mg | Freq: Four times a day (QID) | RECTAL | Status: DC | PRN

## 2016-10-08 MED ORDER — METOCLOPRAMIDE HCL 5 MG/ML IJ SOLN
10.0000 mg | Freq: Four times a day (QID) | INTRAMUSCULAR | Status: DC
  Administered 2016-10-08 – 2016-10-09 (×4): 10 mg via INTRAVENOUS
  Filled 2016-10-08 (×4): qty 2

## 2016-10-08 MED ORDER — HALOPERIDOL LACTATE 5 MG/ML IJ SOLN
2.0000 mg | Freq: Once | INTRAMUSCULAR | Status: AC
  Administered 2016-10-08: 2 mg via INTRAVENOUS
  Filled 2016-10-08: qty 1

## 2016-10-08 MED ORDER — PROMETHAZINE HCL 25 MG/ML IJ SOLN
25.0000 mg | Freq: Once | INTRAMUSCULAR | Status: AC
  Administered 2016-10-08: 25 mg via INTRAVENOUS
  Filled 2016-10-08: qty 1

## 2016-10-08 MED ORDER — DIPHENHYDRAMINE HCL 50 MG/ML IJ SOLN
12.5000 mg | Freq: Once | INTRAMUSCULAR | Status: AC
  Administered 2016-10-08: 12.5 mg via INTRAVENOUS
  Filled 2016-10-08: qty 1

## 2016-10-08 MED ORDER — PANTOPRAZOLE SODIUM 40 MG IV SOLR
40.0000 mg | INTRAVENOUS | Status: DC
  Administered 2016-10-08: 40 mg via INTRAVENOUS
  Filled 2016-10-08: qty 40

## 2016-10-08 MED ORDER — ONDANSETRON HCL 4 MG/2ML IJ SOLN
4.0000 mg | Freq: Once | INTRAMUSCULAR | Status: AC
  Administered 2016-10-08: 4 mg via INTRAVENOUS
  Filled 2016-10-08: qty 2

## 2016-10-08 MED ORDER — MORPHINE SULFATE (PF) 4 MG/ML IV SOLN
2.0000 mg | INTRAVENOUS | Status: DC | PRN
  Administered 2016-10-08: 2 mg via INTRAVENOUS
  Filled 2016-10-08: qty 1

## 2016-10-08 MED ORDER — SODIUM CHLORIDE 0.9 % IV SOLN: INTRAVENOUS | Status: DC

## 2016-10-08 MED ORDER — SODIUM CHLORIDE 0.9 % IV BOLUS (SEPSIS)
500.0000 mL | Freq: Once | INTRAVENOUS | Status: AC
  Administered 2016-10-08: 500 mL via INTRAVENOUS

## 2016-10-08 NOTE — ED Notes (Signed)
Pt in US

## 2016-10-08 NOTE — ED Notes (Signed)
Pt transported to radiology.

## 2016-10-08 NOTE — ED Notes (Signed)
Patient resting since returning from [place]; on monitor. No needs.

## 2016-10-08 NOTE — ED Notes (Signed)
Attempted report x 3.  

## 2016-10-08 NOTE — ED Notes (Signed)
Pt resting.

## 2016-10-08 NOTE — H&P (Signed)
History and Physical    [name] TKZ:[id number] DOB: 03-27-1986 DOA: 10/08/2016  PCP: Philis Fendt, MD Patient coming from: home  Chief Complaint: nausea and vomiting  HPI: [name] is a 31 y.o. male with medical history significant for hypertension, GERD, chronic back pain, obesity, end-stage renal disease on dialysis Monday Wednesday Friday, status post gastric banding presents to emergency department with intractable nausea and vomiting.  Information is obtained from the patient. He states he is a Monday Wednesday Friday dialysis person has been on dialysis for 13 years secondary to hypertension. He also has a history of gastroparesis and gallstones. He states yesterday he developed acute abdominal pain with nausea and vomiting. He completed dialysis yesterday and started feeling a little nauseous so he took some Reglan. He had 45 episodes of emesis describes as clear and yellow. Denies any coffee ground emesis. He denies diarrhea constipation melena bright red blood per rectum. He denies headache dizziness syncope or near-syncope. He denies fever chills cough lower extremity edema. Scribes the pain is abdomen as sharp constant located in the epigastric area.    ED Course: In emergency department he is afebrile hemodynamically stable and not hypoxic. He is provided with Reglan and Benadryl Zofran and continues to dry heave. He also received Haldol and Phenergan and morphine  Review of Systems: As per HPI otherwise 10 point review of systems negative.   Ambulatory Status: Ambulates independently  Past Medical History:  Diagnosis Date  . Arthritis    "all over" (06/11/2016)  . Chronic lower back pain   . Complication of anesthesia    A little while to wake up after knee surgery in 2008  . Dizziness    when coming off of dialysis  . ESRD (end stage renal disease) on dialysis [facility])    MWF and goes to [facility] (06/11/2016)  . Family history of anesthesia complication    mom slow to wake up  . GERD (gastroesophageal reflux disease)    takes Omeprazole as needed  . Headache(784.0)    occasionally  . History of hiatal hernia   . Hyperparathyroidism ([place])   . Hypertension   . Joint pain   . Joint swelling   . Morbid obesity ([place])   . PONV (postoperative nausea and vomiting)   . Renal insufficiency     Past Surgical History:  Procedure Laterality Date  . AV FISTULA PLACEMENT Bilateral 2005,2007   Uses Right arm  . BREATH TEK H PYLORI N/A 05/15/2014   Procedure: BREATH TEK H PYLORI;  Surgeon: Alphonsa Overall, MD;  Location: [facility];  Service: General;  Laterality: N/A;  . ESOPHAGOGASTRODUODENOSCOPY N/A 12/07/2014   Procedure: ESOPHAGOGASTRODUODENOSCOPY (EGD);  Surgeon: Alphonsa Overall, MD;  Location: [facility] ENDOSCOPY;  Service: General;  Laterality: N/A;  . ESOPHAGOGASTRODUODENOSCOPY N/A 12/17/2014   Procedure: ESOPHAGOGASTRODUODENOSCOPY (EGD);  Surgeon: Alphonsa Overall, MD;  Location: [place];  Service: General;  Laterality: N/A;  . ESOPHAGOGASTRODUODENOSCOPY N/A 08/16/2016   Procedure: ESOPHAGOGASTRODUODENOSCOPY (EGD);  Surgeon: Jerene Bears, MD;  Location: [place];  Service: Gastroenterology;  Laterality: N/A;  . HERNIA REPAIR  11/2014   w/lap band OR  . JOINT REPLACEMENT    . KNEE ARTHROSCOPY Left 2007  . LAPAROSCOPIC GASTRIC BANDING N/A 11/12/2014   Procedure: LAPAROSCOPIC GASTRIC BANDING;  Surgeon: Alphonsa Overall, MD;  Location: [facility];  Service: General;  Laterality: N/A;  . PARATHYROIDECTOMY N/A 03/30/2013   Procedure: TOTAL PARATHYROIDECTOMY WITH AUTOTRANSPLANT;  Surgeon: Earnstine Regal, MD;  Location: [place];  Service: General;  Laterality: N/A;  Autotransplant to left lower arm.  Marland Kitchen TOTAL KNEE ARTHROPLASTY Right 03/27/2014   Procedure: UNICOMPARTMENTAL ARTHROPLASTY;  Surgeon: Meredith Pel, MD;  Location: [place];  Service: Orthopedics;  Laterality: Right;    Social History   Social History  . Marital status: Single    Spouse name: N/A  . Number  of children: 0  . Years of education: N/A   Occupational History  . Not on file.   Social History Main Topics  . Smoking status: Former Smoker    Packs/day: 0.20    Years: 13.00    Types: Cigarettes    Quit date: 12/17/2014  . Smokeless tobacco: Never Used  . Alcohol use No  . Drug use: No  . Sexual activity: No   Other Topics Concern  . Not on file   Social History Narrative  . No narrative on file  Was at home with his room 8.  Allergies  Allergen Reactions  . Dilaudid [Hydromorphone Hcl] Other (See Comments)    Patient became hypoxic to 60% with dilaudid 1mg  IV    Family History  Problem Relation Age of Onset  . Hypertension Mother   . Diabetes Father   . Kidney Stones Sister   . Diabetes Maternal Grandmother   . Liver cancer Maternal Uncle     Prior to Admission medications   Medication Sig Start Date End Date Taking? Authorizing Provider  calcium carbonate (TUMS - DOSED IN MG ELEMENTAL CALCIUM) 500 MG chewable tablet Chew 2 tablets by mouth 2 (two) times daily as needed for indigestion or heartburn.    Yes Historical Provider, MD  glycopyrrolate (ROBINUL) 2 MG tablet Take 1 tablet (2 mg total) by mouth 2 (two) times daily. 01/25/15  Yes Inda Castle, MD  Hyoscyamine Sulfate 0.375 MG TBCR Take 1 tablet twice daily 01/22/15  Yes Inda Castle, MD  lidocaine (XYLOCAINE) 2 % jelly Apply 1 application topically as needed. Patient taking differently: Apply 1 application topically daily as needed (pain).  02/19/16  Yes Benjamin Cartner, [name]  methocarbamol (ROBAXIN) 500 MG tablet Take 1 tablet (500 mg total) by mouth every 8 (eight) hours as needed for muscle spasms. 06/15/16  Yes Theodis Blaze, MD  metoCLOPramide (REGLAN) 5 MG tablet Take 1 tablet (5 mg total) by mouth 2 (two) times daily before a meal. 08/16/16  Yes Florencia Reasons, MD  multivitamin (RENA-VIT) TABS tablet Take 1 tablet by mouth daily.   Yes Historical Provider, MD  omeprazole (PRILOSEC) 20 MG capsule Take 1  capsule (20 mg total) by mouth daily. 07/27/16  Yes Ezequiel Essex, MD  promethazine (PHENERGAN) 25 MG suppository Place 1 suppository (25 mg total) rectally every 6 (six) hours as needed. 07/29/16  Yes Duffy Bruce, MD  sevelamer carbonate (RENVELA) 2.4 G PACK Take 2.4 g by mouth 3 (three) times daily with meals.    Yes Historical Provider, MD  traMADol (ULTRAM) 50 MG tablet Take 1 tablet (50 mg total) by mouth every 8 (eight) hours as needed. Patient taking differently: Take 50 mg by mouth every 6 (six) hours as needed (for pain).  06/15/16  Yes Theodis Blaze, MD  VOLTAREN 1 % GEL Apply 4 g topically 4 (four) times daily. Patient taking differently: Apply 4 g topically as needed.  02/04/16  Yes April Palumbo, MD  albuterol (PROVENTIL HFA;VENTOLIN HFA) 108 (90 Base) MCG/ACT inhaler Inhale 2 puffs into the lungs every 4 (four) hours as needed for wheezing or  shortness of breath. 05/14/16   Lysbeth Penner, [name]    Physical Exam: Vitals:   10/08/16 1415 10/08/16 1515 10/08/16 1615 10/08/16 1700  BP: 126/81 119/61 115/59 117/65  Pulse: 91 94 86 91  Resp:  18 18 18   Temp:      TempSrc:      SpO2: 97% 100% 100% 97%     General:  Appears Excessive slightly uncomfortable Eyes:  PERRL, EOMI, normal lids, iris ENT:  grossly normal hearing, lips & tongue, because membranes of his mouth is pink and dry Neck:  no LAD, masses or thyromegaly Cardiovascular:  RRR, no m/r/g. No LE edema.  Respiratory:  CTA bilaterally, no w/r/r. Normal respiratory effort. Abdomen:  soft, obese very sluggish bowel sounds mild diffuse tenderness to palpation Skin:  no rash or induration seen on limited exam Musculoskeletal:  grossly normal tone BUE/BLE, good ROM, no bony abnormality Psychiatric:  grossly normal mood and affect, speech fluent and appropriate, AOx3 Neurologic:  CN 2-12 grossly intact, moves all extremities in coordinated fashion, sensation intact  Labs on Admission: I have personally reviewed  following labs and imaging studies  CBC:  Recent Labs Lab 10/08/16 0953 10/08/16 1443  WBC 10.2  --   NEUTROABS 8.3*  --   HGB 15.6 14.6  HCT 44.6 43.0  MCV 91.0  --   PLT 211  --    Basic Metabolic Panel:  Recent Labs Lab 10/08/16 1443  NA 137  K 3.6  CL 96*  GLUCOSE 101*  BUN 41*  CREATININE 10.20*   GFR: CrCl cannot be calculated (Unknown ideal weight.). Liver Function Tests: No results for input(s): AST, ALT, ALKPHOS, BILITOT, PROT, ALBUMIN in the last 168 hours. No results for input(s): LIPASE, AMYLASE in the last 168 hours. No results for input(s): AMMONIA in the last 168 hours. Coagulation Profile: No results for input(s): INR, PROTIME in the last 168 hours. Cardiac Enzymes: No results for input(s): CKTOTAL, CKMB, CKMBINDEX, TROPONINI in the last 168 hours. BNP (last 3 results) No results for input(s): PROBNP in the last 8760 hours. HbA1C: No results for input(s): HGBA1C in the last 72 hours. CBG: No results for input(s): GLUCAP in the last 168 hours. Lipid Profile: No results for input(s): CHOL, HDL, LDLCALC, TRIG, CHOLHDL, LDLDIRECT in the last 72 hours. Thyroid Function Tests: No results for input(s): TSH, T4TOTAL, FREET4, T3FREE, THYROIDAB in the last 72 hours. Anemia Panel: No results for input(s): VITAMINB12, FOLATE, FERRITIN, TIBC, IRON, RETICCTPCT in the last 72 hours. Urine analysis:    Component Value Date/Time   LABSPEC 1.010 01/08/2010 1817   PHURINE >=9.0 01/08/2010 1817   GLUCOSEU 100 (A) 01/08/2010 1817   HGBUR LARGE (A) 01/08/2010 1817   BILIRUBINUR LARGE (A) 01/08/2010 1817   KETONESUR 15 (A) 01/08/2010 1817   PROTEINUR >=300 (A) 01/08/2010 1817   UROBILINOGEN >=8.0 01/08/2010 1817   NITRITE POSITIVE (A) 01/08/2010 1817   LEUKOCYTESUR (A) 01/08/2010 1817    LARGE Biochemical Testing Only. Please order routine urinalysis from main lab if confirmatory testing is needed.    Creatinine Clearance: CrCl cannot be calculated (Unknown  ideal weight.).  Sepsis Labs: @LABRCNTIP (procalcitonin:4,lacticidven:4) )No results found for this or any previous visit (from the past 240 hour(s)).   Radiological Exams on Admission: Dg Abd Acute W/chest  Result Date: 10/08/2016 CLINICAL DATA:  Generalized abdominal pain and vomiting for 2 days EXAM: DG ABDOMEN ACUTE W/ 1V CHEST COMPARISON:  None. FINDINGS: Cardiac shadow is within normal limits. The lungs are well  aerated bilaterally. No focal infiltrate is seen. Scattered large and small bowel gas is noted. A few scattered air-fluid levels are noted. No free air is seen. No obstructive changes are noted. No bony abnormality is seen. IMPRESSION: Scattered abdominal air-fluid levels without obstructive change. Electronically Signed   By: Inez Catalina M.D.   On: 10/08/2016 11:40   US Abdomen Limited Ruq  Result Date: 10/08/2016 CLINICAL DATA:  Right upper quadrant pain for 2 days EXAM: US ABDOMEN LIMITED - RIGHT UPPER QUADRANT COMPARISON:  None. FINDINGS: Gallbladder: Multiple calculi are noted. No gallbladder wall thickening or pericholecystic fluid is noted. Common bile duct: Diameter: 3.8 mm. Liver: No focal lesion identified. Within normal limits in parenchymal echogenicity. IMPRESSION: Cholelithiasis without complicating factors. Electronically Signed   By: Inez Catalina M.D.   On: 10/08/2016 11:54    EKG:   Assessment/Plan Principal Problem:   Intractable nausea and vomiting Active Problems:   ESRD on dialysis ([place])   Morbid obesity, weight 291, BMI - 47   Abdominal pain, epigastric   GERD (gastroesophageal reflux disease)   Essential hypertension   #1. Intractable nausea and vomiting. persistent in spite of treatments in the emergency department. Acute abdominal x-ray with cholelithiasis without complicating factors. History of same. He is afebrile hemodynamically stable and non-toxic-appearing -admit -supportive therapy, reglan, zofran, benadryl -gentle iv fluids -follow  lipase -Liquids -Advance as tolerated  #2. Abdominal pain. Likely related to gastroparesis. Hx gastric banding.  Patient with history of same. X-ray noted above. -follow lipase -protonix iv -Analgesia  #3. End-stage renal disease on dialysis related to hypertension. He is a Monday Wednesday Friday dialysis patient he completed dialysis yesterday. Creatinine at baseline -nephrology consult requested  4. Hypertension. Fair control in the emergency department medications no antihypertensive medications as currently noted -monitor    DVT prophylaxis: scd Code Status: full  Family Communication: none present  Disposition Plan: home  Consults called: nephrology  Admission status: obs    Dyanne Carrel M MD [facility]  If 7PM-7AM, please contact night-coverage [email or web address] Password [email or web address]  10/08/2016, 5:11 PM

## 2016-10-08 NOTE — ED Provider Notes (Signed)
[facility] DEPT Provider Note   CSN: [id number] Arrival date & time: 10/08/16  1027     History   Chief Complaint Chief Complaint  Patient presents with  . Emesis    HPI [name] is a 31 y.o. male.  31 year old man with PMH ESRD secondary to hypertension, GERD, gastroparesis, and hx of gallstones who presents with nausea and vomiting which began last night. Yesterday he began to feel nauseous just after dialysis he took oral Reglan but began vomiting. The vomiting is clear and yellow, non bloody. He has vomited 10x since yesterday evening. He has been weak and diaphoretic. He denies diarrhea. Last bowel movement was Tuesday, it was not different than his regular bm. Denies dysuria. Denies myalgia, congestion, or changes in appetite. Late last night after the nausea and vomiting he developed an epigastric pain which is sharp and constant, it is worse with deep breathing and movement.       Past Medical History:  Diagnosis Date  . Arthritis    "all over" (06/11/2016)  . Chronic lower back pain   . Complication of anesthesia    A little while to wake up after knee surgery in 2008  . Dizziness    when coming off of dialysis  . ESRD (end stage renal disease) on dialysis [facility])    MWF and goes to [facility] (06/11/2016)  . Family history of anesthesia complication    mom slow to wake up  . GERD (gastroesophageal reflux disease)    takes Omeprazole as needed  . Headache(784.0)    occasionally  . History of hiatal hernia   . Hyperparathyroidism ([place])   . Hypertension   . Joint pain   . Joint swelling   . Morbid obesity ([place])   . PONV (postoperative nausea and vomiting)   . Renal insufficiency     Patient Active Problem List   Diagnosis Date Noted  . Hematemesis 08/14/2016  . GERD (gastroesophageal reflux disease) 08/14/2016  . Essential hypertension 08/14/2016  . GIB (gastrointestinal bleeding) 08/14/2016  . SIRS (systemic inflammatory response syndrome) ([place])  08/14/2016  . CKD (chronic kidney disease) stage V requiring chronic dialysis ([place]) 06/11/2016  . Non-intractable vomiting with nausea 06/11/2016  . Hepatic steatosis 06/11/2016  . Cholelithiasis 06/11/2016  . Localized osteoarthritis of left knee 07/11/2015  . Abdominal pain, epigastric 12/05/2014  . Lapband April 2016 11/27/2014  . Arthritis of knee 03/27/2014  . Morbid obesity, weight 291, BMI - 47 02/15/2014  . ESRD on dialysis ([place]) 07/12/2013  . Hyperparathyroidism, secondary ([place]) 03/14/2013    Past Surgical History:  Procedure Laterality Date  . AV FISTULA PLACEMENT Bilateral 2005,2007   Uses Right arm  . BREATH TEK H PYLORI N/A 05/15/2014   Procedure: BREATH TEK H PYLORI;  Surgeon: Alphonsa Overall, MD;  Location: [facility];  Service: General;  Laterality: N/A;  . ESOPHAGOGASTRODUODENOSCOPY N/A 12/07/2014   Procedure: ESOPHAGOGASTRODUODENOSCOPY (EGD);  Surgeon: Alphonsa Overall, MD;  Location: [facility] ENDOSCOPY;  Service: General;  Laterality: N/A;  . ESOPHAGOGASTRODUODENOSCOPY N/A 12/17/2014   Procedure: ESOPHAGOGASTRODUODENOSCOPY (EGD);  Surgeon: Alphonsa Overall, MD;  Location: [place];  Service: General;  Laterality: N/A;  . ESOPHAGOGASTRODUODENOSCOPY N/A 08/16/2016   Procedure: ESOPHAGOGASTRODUODENOSCOPY (EGD);  Surgeon: Jerene Bears, MD;  Location: [place];  Service: Gastroenterology;  Laterality: N/A;  . HERNIA REPAIR  11/2014   w/lap band OR  . JOINT REPLACEMENT    . KNEE ARTHROSCOPY Left 2007  . LAPAROSCOPIC GASTRIC BANDING N/A 11/12/2014  Procedure: LAPAROSCOPIC GASTRIC BANDING;  Surgeon: Alphonsa Overall, MD;  Location: [facility];  Service: General;  Laterality: N/A;  . PARATHYROIDECTOMY N/A 03/30/2013   Procedure: TOTAL PARATHYROIDECTOMY WITH AUTOTRANSPLANT;  Surgeon: Earnstine Regal, MD;  Location: [place];  Service: General;  Laterality: N/A;  Autotransplant to left lower arm.  Marland Kitchen TOTAL KNEE ARTHROPLASTY Right 03/27/2014   Procedure: UNICOMPARTMENTAL ARTHROPLASTY;  Surgeon: Meredith Pel, MD;  Location: [place];  Service: Orthopedics;  Laterality: Right;       Home Medications    Prior to Admission medications   Medication Sig Start Date End Date Taking? Authorizing Provider  albuterol (PROVENTIL HFA;VENTOLIN HFA) 108 (90 Base) MCG/ACT inhaler Inhale 2 puffs into the lungs every 4 (four) hours as needed for wheezing or shortness of breath. 05/14/16   Lysbeth Penner, [name]  calcium carbonate (TUMS - DOSED IN MG ELEMENTAL CALCIUM) 500 MG chewable tablet Chew 2 tablets by mouth 2 (two) times daily as needed for indigestion or heartburn.     Historical Provider, MD  glycopyrrolate (ROBINUL) 2 MG tablet Take 1 tablet (2 mg total) by mouth 2 (two) times daily. 01/25/15   Inda Castle, MD  Hyoscyamine Sulfate 0.375 MG TBCR Take 1 tablet twice daily 01/22/15   Inda Castle, MD  lidocaine (XYLOCAINE) 2 % jelly Apply 1 application topically as needed. Patient taking differently: Apply 1 application topically daily as needed (pain).  02/19/16   Comer Locket, [name]  methocarbamol (ROBAXIN) 500 MG tablet Take 1 tablet (500 mg total) by mouth every 8 (eight) hours as needed for muscle spasms. 06/15/16   Theodis Blaze, MD  metoCLOPramide (REGLAN) 5 MG tablet Take 1 tablet (5 mg total) by mouth 2 (two) times daily before a meal. 08/16/16   Florencia Reasons, MD  multivitamin (RENA-VIT) TABS tablet Take 1 tablet by mouth daily.    Historical Provider, MD  omeprazole (PRILOSEC) 20 MG capsule Take 1 capsule (20 mg total) by mouth daily. 07/27/16   Ezequiel Essex, MD  ondansetron (ZOFRAN) 4 MG tablet Take 1 tablet (4 mg total) by mouth every 6 (six) hours. 07/27/16   Ezequiel Essex, MD  promethazine (PHENERGAN) 25 MG suppository Place 1 suppository (25 mg total) rectally every 6 (six) hours as needed. 07/29/16   Duffy Bruce, MD  sevelamer carbonate (RENVELA) 2.4 G PACK Take 2.4 g by mouth 3 (three) times daily with meals.     Historical Provider, MD  traMADol (ULTRAM) 50 MG tablet Take 1  tablet (50 mg total) by mouth every 8 (eight) hours as needed. Patient taking differently: Take 50 mg by mouth every 6 (six) hours as needed (for pain).  06/15/16   Theodis Blaze, MD  VOLTAREN 1 % GEL Apply 4 g topically 4 (four) times daily. Patient taking differently: Apply 4 g topically as needed.  02/04/16   April Palumbo, MD    Family History Family History  Problem Relation Age of Onset  . Hypertension Mother   . Diabetes Father   . Kidney Stones Sister   . Diabetes Maternal Grandmother   . Liver cancer Maternal Uncle     Social History Social History  Substance Use Topics  . Smoking status: Former Smoker    Packs/day: 0.20    Years: 13.00    Types: Cigarettes    Quit date: 12/17/2014  . Smokeless tobacco: Never Used  . Alcohol use No     Allergies   Dilaudid [hydromorphone hcl]   Review of  Systems Review of Systems  HENT: Negative for congestion.   Respiratory: Negative for shortness of breath.   Gastrointestinal: Positive for abdominal pain, nausea and vomiting. Negative for diarrhea.  Musculoskeletal: Negative for myalgias.  All other systems reviewed and are negative.  Physical Exam Updated Vital Signs BP (!) 110/51 (BP Location: Left Arm)   Pulse 110   Temp 98.3 F (36.8 C) (Oral)   Resp 20   SpO2 100%   Physical Exam  Constitutional: He is oriented to person, place, and time. He appears well-developed and well-nourished. No distress.  HENT:  Head: Normocephalic and atraumatic.  Eyes: Conjunctivae are normal. No scleral icterus.  Neck: Normal range of motion. Neck supple.  Cardiovascular: Normal rate and regular rhythm.   No murmur heard. Pulmonary/Chest: Effort normal. No respiratory distress.  Abdominal: Soft. He exhibits no distension. There is no guarding.  Actively vomiting and dry heaving  Delayed bowel sounds  RUQ and epigastric tenderness   Neurological: He is alert and oriented to person, place, and time.  Skin: Skin is warm and dry. He  is not diaphoretic.  Psychiatric: He has a normal mood and affect. His behavior is normal.     ED Treatments / Results  Labs (all labs ordered are listed, but only abnormal results are displayed) Labs Reviewed  CBC WITH DIFFERENTIAL/PLATELET  COMPREHENSIVE METABOLIC PANEL  LIPASE, BLOOD    EKG  EKG Interpretation None       Radiology No results found.  Procedures Procedures (including critical care time)  Medications Ordered in ED Medications  ondansetron (ZOFRAN) injection 4 mg (not administered)  sodium chloride 0.9 % bolus 500 mL (500 mLs Intravenous New Bag/Given 10/08/16 0957)     Initial Impression / Assessment and Plan / ED Course  I have reviewed the triage vital signs and the nursing notes.  Pertinent labs & imaging results that were available during my care of the patient were reviewed by me and considered in my medical decision making (see chart for details).   31 year old man with PMH ESRD secondary to hypertension, GERD, and gastroparesis who presents with epigastric abdominal pain and vomiting. Presentation may be related to gastroparesis. Nausea did not respond to zofran. CBC, CMP, and lipase ordered.  10:45 Received dose of Haldol 2 mg and IV phenergan 25 mg, IV morphine 4 mg  11:40 Abd xray revealed scattered air- fluid levels without free air.   1:50 RUQ ultrasound revealed multiple gall stones, no gallbladder wall thickening or pericholecystic fluid. CBD diameter 3.8 mm.   2:50 Patient continues to have nausea and vomiting which is not responding to anti-emetics. He will need to be admitted for continued fluid rehydration for intractable vomiting.    Final Clinical Impressions(s) / ED Diagnoses   Final diagnoses:  None    New Prescriptions New Prescriptions   No medications on file     Ledell Noss, MD 10/08/16 [place] Yao, MD 10/10/16 2038

## 2016-10-08 NOTE — ED Triage Notes (Addendum)
Pt reports vomiting after dialysis; was able to complete; states vomited 10 times; no diarr. Not currently vomiting but has nausea. No other complaints. States CBG "been fine."

## 2016-10-08 NOTE — ED Notes (Signed)
Pt awake and states he is nauseated; provider informed

## 2016-10-09 ENCOUNTER — Other Ambulatory Visit: Payer: Self-pay

## 2016-10-09 DIAGNOSIS — K3184 Gastroparesis: Secondary | ICD-10-CM | POA: Diagnosis not present

## 2016-10-09 DIAGNOSIS — R112 Nausea with vomiting, unspecified: Secondary | ICD-10-CM | POA: Diagnosis not present

## 2016-10-09 DIAGNOSIS — N186 End stage renal disease: Secondary | ICD-10-CM | POA: Diagnosis not present

## 2016-10-09 DIAGNOSIS — Z992 Dependence on renal dialysis: Secondary | ICD-10-CM | POA: Diagnosis not present

## 2016-10-09 DIAGNOSIS — G43A1 Cyclical vomiting, intractable: Secondary | ICD-10-CM | POA: Diagnosis not present

## 2016-10-09 LAB — CBC
HEMATOCRIT: 35.7 % — AB (ref 39.0–52.0)
HEMOGLOBIN: 12.1 g/dL — AB (ref 13.0–17.0)
MCH: 30.9 pg (ref 26.0–34.0)
MCHC: 33.9 g/dL (ref 30.0–36.0)
MCV: 91.1 fL (ref 78.0–100.0)
Platelets: 187 10*3/uL (ref 150–400)
RBC: 3.92 MIL/uL — ABNORMAL LOW (ref 4.22–5.81)
RDW: 14.1 % (ref 11.5–15.5)
WBC: 11.1 10*3/uL — ABNORMAL HIGH (ref 4.0–10.5)

## 2016-10-09 LAB — BASIC METABOLIC PANEL
ANION GAP: 16 — AB (ref 5–15)
BUN: 51 mg/dL — AB (ref 6–20)
CHLORIDE: 99 mmol/L — AB (ref 101–111)
CO2: 23 mmol/L (ref 22–32)
Calcium: 8 mg/dL — ABNORMAL LOW (ref 8.9–10.3)
Creatinine, Ser: 11.65 mg/dL — ABNORMAL HIGH (ref 0.61–1.24)
GFR calc Af Amer: 6 mL/min — ABNORMAL LOW (ref >60)
GFR calc non Af Amer: 5 mL/min — ABNORMAL LOW (ref >60)
GLUCOSE: 95 mg/dL (ref 65–99)
Potassium: 3.7 mmol/L (ref 3.5–5.1)
Sodium: 138 mmol/L (ref 135–145)

## 2016-10-09 LAB — HIV ANTIBODY (ROUTINE TESTING W REFLEX): HIV SCREEN 4TH GENERATION: NONREACTIVE

## 2016-10-09 LAB — MRSA PCR SCREENING: MRSA by PCR: NEGATIVE

## 2016-10-09 MED ORDER — CALCITRIOL 0.5 MCG PO CAPS: 0.5000 ug | ORAL_CAPSULE | Freq: Once | ORAL | Status: DC

## 2016-10-09 MED ORDER — METOCLOPRAMIDE HCL 5 MG PO TABS: 5.0000 mg | ORAL_TABLET | Freq: Two times a day (BID) | ORAL | 0 refills | Status: DC

## 2016-10-09 MED ORDER — ONDANSETRON HCL 4 MG PO TABS: 4.0000 mg | ORAL_TABLET | Freq: Three times a day (TID) | ORAL | 0 refills | Status: DC | PRN

## 2016-10-09 MED ORDER — RENA-VITE PO TABS: 1.0000 | ORAL_TABLET | Freq: Every day | ORAL | Status: DC

## 2016-10-09 MED ORDER — SODIUM CHLORIDE 0.9 % IV BOLUS (SEPSIS)
250.0000 mL | Freq: Once | INTRAVENOUS | Status: AC
  Administered 2016-10-09: 250 mL via INTRAVENOUS

## 2016-10-09 NOTE — Progress Notes (Signed)
Pt. transported via stretcher from ER to [place]; alert and oriented x4; c/o abd. pain. Oriented to room and call button. Skin intact.

## 2016-10-09 NOTE — Discharge Summary (Signed)
Physician Discharge Summary  [name]:[id number] DOB: Nov 01, 1985 DOA: 10/08/2016  PCP: Philis Fendt, MD  Admit date: 10/08/2016 Discharge date: 10/09/2016  Admitted From: Home Disposition: Home   Recommendations for Outpatient Follow-up:  1. Follow up with PCP in 1-2 [place].  2. Follow up at outpatient dialysis as scheduled 3/10.  3. Pt given short course of zofran to assist with symptom management of N/V. Max dose (for ESRD patient) already prescribed.   Home Health: none Equipment/Devices: None Discharge Condition: Stable CODE STATUS: Full Diet recommendation: Renal, fluid restricted.  Brief/Interim Summary: [name] is a 31 y.o. male with a history of HTN, GERD, chronic back pain, obesity, ESRD on HD MWFS, gastroparesis and s/p gastric banding who presented to the ED with intractable abdominal pain, nausea and vomiting consistent with prior flares of gastroparesis. Symptoms began after HD on 3/7 and only minimally improved with reglan prompting visit to ED on 3/8. Work up showed the patient to be afebrile, hemodynamically stable with soft BP's which is his baseline. Creatinine 10.20, potassium 3.6, BUN 41. WBC normal at 10.2. Abd XR showed no obstruction or free air and abd U/S showed known cholelithiasis without any inflammation. Symptoms continued despite antiemetics, so he was brought in for observation. Overnight with reglan and zofran symptoms have significantly improved. Abdominal pain has resolved and he's tolerated a clear liquid diet for breakfast. Nephrology was consulted and recommends HD as scheduled on 3/10. Symptoms have continued to be tolerable and he has tolerated a diet for lunch, so will be discharged in stable condition with follow up in HD within 24 hours.   Discharge Diagnoses:  Principal Problem:   Intractable nausea and vomiting Active Problems:   ESRD on dialysis ([place])   Morbid obesity, weight 291, BMI - 47   Abdominal pain, epigastric   GERD  (gastroesophageal reflux disease)   Essential hypertension  Gastroparesis: Presumed cause of nausea and vomiting, also possibly post HD reaction. No diarrhea. He has remained hemodynamically stable and symptoms have resolved with supportive therapies. Lipase wnl. U/S with cholelithiasis without inflammation. No obstruction on XR.  - Will provide Rx for zofran in addition to reglan to be used as needed.  - Tolerating diet  Abdominal pain. Likely related to gastroparesis, higher risk due to history of lap banding, though symptoms have resolved. Abd exam benign.  End-stage renal disease on HD MWFS, no indications for urgent HD per nephrology. Will follow up as scheduled tomorrow.    History of hypertension with concomitant nephropathy: Not on medications, presumed resolution with volume control on HD. BP's trending low, which is pt's baseline. Asymptomatic. Has had midodrine for this with HD in the past.  - Monitor  Discharge Instructions Discharge Instructions    Discharge instructions    Complete by:  As directed    You were admitted for gastroparesis causing nausea, vomiting and abdominal pain which has resolved with medications. You are stable for discharge with the following recommendations:  - Continue taking home medications as prescribed.  - You can also take zofran (sent to your pharmacy) as directed for vomiting.  - If your symptoms return, seek medical care right away, otherwise follow up with your PCP and nephrologist and continue hemodialysis as scheduled.     Allergies as of 10/09/2016      Reactions   Dilaudid [hydromorphone Hcl] Other (See Comments)   Patient became hypoxic to 60% with dilaudid 1mg  IV      Medication List  TAKE these medications   albuterol 108 (90 Base) MCG/ACT inhaler Commonly known as:  PROVENTIL HFA;VENTOLIN HFA Inhale 2 puffs into the lungs every 4 (four) hours as needed for wheezing or shortness of breath.   calcium carbonate 500 MG chewable  tablet Commonly known as:  TUMS - dosed in mg elemental calcium Chew 2 tablets by mouth 2 (two) times daily as needed for indigestion or heartburn.   glycopyrrolate 2 MG tablet Commonly known as:  ROBINUL Take 1 tablet (2 mg total) by mouth 2 (two) times daily.   Hyoscyamine Sulfate 0.375 MG Tbcr Take 1 tablet twice daily   lidocaine 2 % jelly Commonly known as:  XYLOCAINE Apply 1 application topically as needed. What changed:  when to take this  reasons to take this   methocarbamol 500 MG tablet Commonly known as:  ROBAXIN Take 1 tablet (500 mg total) by mouth every 8 (eight) hours as needed for muscle spasms.   metoCLOPramide 5 MG tablet Commonly known as:  REGLAN Take 1 tablet (5 mg total) by mouth 2 (two) times daily before a meal.   multivitamin Tabs tablet Take 1 tablet by mouth daily.   omeprazole 20 MG capsule Commonly known as:  PRILOSEC Take 1 capsule (20 mg total) by mouth daily.   ondansetron 4 MG tablet Commonly known as:  ZOFRAN Take 1 tablet (4 mg total) by mouth every 8 (eight) hours as needed for nausea or vomiting.   promethazine 25 MG suppository Commonly known as:  PHENERGAN Place 1 suppository (25 mg total) rectally every 6 (six) hours as needed.   sevelamer carbonate 2.4 g Pack Commonly known as:  RENVELA Take 2.4 g by mouth 3 (three) times daily with meals.   traMADol 50 MG tablet Commonly known as:  ULTRAM Take 1 tablet (50 mg total) by mouth every 8 (eight) hours as needed. What changed:  when to take this  reasons to take this   VOLTAREN 1 % Gel Generic drug:  diclofenac sodium Apply 4 g topically 4 (four) times daily. What changed:  when to take this  reasons to take this      Follow-up Information    Philis Fendt, MD Follow up.   Specialty:  Internal Medicine Contact information: [address] [place] [address] [phone]        DETERDING,JAMES L, MD Follow up.   Specialty:  Nephrology Contact  information: [address] [place] [address] [phone]          Allergies  Allergen Reactions  . Dilaudid [Hydromorphone Hcl] Other (See Comments)    Patient became hypoxic to 60% with dilaudid 1mg  IV    Consultations:  Nephrology, Dr. Jonnie Finner  Procedures/Studies: Dg Abd Acute W/chest  Result Date: 10/08/2016 CLINICAL DATA:  Generalized abdominal pain and vomiting for 2 days EXAM: DG ABDOMEN ACUTE W/ 1V CHEST COMPARISON:  None. FINDINGS: Cardiac shadow is within normal limits. The lungs are well aerated bilaterally. No focal infiltrate is seen. Scattered large and small bowel gas is noted. A few scattered air-fluid levels are noted. No free air is seen. No obstructive changes are noted. No bony abnormality is seen. IMPRESSION: Scattered abdominal air-fluid levels without obstructive change. Electronically Signed   By: Inez Catalina M.D.   On: 10/08/2016 11:40   US Abdomen Limited Ruq  Result Date: 10/08/2016 CLINICAL DATA:  Right upper quadrant pain for 2 days EXAM: US ABDOMEN LIMITED - RIGHT UPPER QUADRANT COMPARISON:  None. FINDINGS: Gallbladder: Multiple calculi are  noted. No gallbladder wall thickening or pericholecystic fluid is noted. Common bile duct: Diameter: 3.8 mm. Liver: No focal lesion identified. Within normal limits in parenchymal echogenicity. IMPRESSION: Cholelithiasis without complicating factors. Electronically Signed   By: Inez Catalina M.D.   On: 10/08/2016 11:54   Subjective: Pt reports no abdominal pain for several hours even with breakfast. Nausea is controlled with medications and vomiting has subsided since admission.   Discharge Exam: Vitals:   10/09/16 0652 10/09/16 0915  BP: (!) 88/42 (!) 73/25  Pulse:  88  Resp:  19  Temp:  98.2 F (36.8 C)   General: Obese 31 yo M in no distress Cardiovascular: RRR, S1/S2 +, no rubs, no gallops Respiratory: Nonlabored, CTA bilaterally, no wheezing, no rhonchi Abdominal: Soft, obese, not tender or distended,  bowel sounds + Extremities: No edema, no cyanosis. LUE AVF + thrill  The results of significant diagnostics from this hospitalization (including imaging, microbiology, ancillary and laboratory) are listed below for reference.    Labs: Basic Metabolic Panel:  Recent Labs Lab 10/08/16 1443 10/08/16 2021 10/09/16 0421  NA 137 139 138  K 3.6 3.9 3.7  CL 96* 93* 99*  CO2  --  25 23  GLUCOSE 101* 71 95  BUN 41* 38* 51*  CREATININE 10.20* 9.81* 11.65*  CALCIUM  --  9.1 8.0*   Liver Function Tests:  Recent Labs Lab 10/08/16 2021  AST 15  ALT 18  ALKPHOS 57  BILITOT 0.8  PROT 8.7*  ALBUMIN 4.3    Recent Labs Lab 10/08/16 2021  LIPASE 30   CBC:  Recent Labs Lab 10/08/16 0953 10/08/16 1443 10/09/16 0421  WBC 10.2  --  11.1*  NEUTROABS 8.3*  --   --   HGB 15.6 14.6 12.1*  HCT 44.6 43.0 35.7*  MCV 91.0  --  91.1  PLT 211  --  187   Microbiology Recent Results (from the past 240 hour(s))  MRSA PCR Screening     Status: None   Collection Time: 10/08/16 11:25 PM  Result Value Ref Range Status   MRSA by PCR NEGATIVE NEGATIVE Final    Comment:        The GeneXpert MRSA Assay (FDA approved for NASAL specimens only), is one component of a comprehensive MRSA colonization surveillance program. It is not intended to diagnose MRSA infection nor to guide or monitor treatment for MRSA infections.     Time coordinating discharge: Approximately 40 minutes  Vance Gather, MD  [facility] 10/09/2016, 2:38 PM Pager [phone]  If 7PM-7AM, please contact night-coverage [email or web address] Password [email or web address]

## 2016-10-09 NOTE — Care Management Obs Status (Signed)
[facility] NOTIFICATION   Patient Details  Name: [name] MRN: [id number] Date of Birth: [birth year]   Medicare Observation Status Notification Given:  Yes    Jyllian Haynie, Rory Percy, RN 10/09/2016, 2:23 PM

## 2016-10-09 NOTE — Plan of Care (Signed)
Problem: Education: Goal: Knowledge of [facility] Education information/materials will improve Outcome: Progressing POC reviewed with pt.   

## 2016-10-09 NOTE — Progress Notes (Signed)
bp 86/36 dinamap/ 82/40 manual and Baltazar Najjar, NP notified/paged. Orders written [phone].

## 2016-10-09 NOTE — Consult Note (Deleted)
[facility] Renal Consultation Note    Indication for Consultation:  Management of ESRD/hemodialysis; anemia, hypertension/volume and secondary hyperparathyroidism  HPI: [name] is a 31 y.o. male with ESRD on hemodialysis MWFS at [facility]. PMH includes morbid obesity, GERD, chronic pain, chronic hypotension. S/p lap band in 2016.  Has been evaluated for recurrent abdominal pain since then. Recent hospitalization 08/2016 with abdominal pain/nausea had EGD with gastritis/duodenitis c/w gastroparesis. Has been treating with daily Reglan/Zofran.  He is currently admitted under observation status for intractable nausea/vomiting that began Wednesday after dialysis. He presented to ED yesterday. Abdominal ultrasound with cholelithiasis without complicating factors. Seen at bedside currently and nausea and vomiting have improved. He was able to eat breakfast this morning and overall feeling better.  Denies hematemesis, abdominal pain, diarrhea, or dyspnea.  Last HD was 3/7. Usually completing full treatments though not always reaching EDW large fluid gains.    Past Medical History:  Diagnosis Date  . Arthritis    "all over" (06/11/2016)  . Chronic lower back pain   . Complication of anesthesia    A little while to wake up after knee surgery in 2008  . Dizziness    when coming off of dialysis  . ESRD (end stage renal disease) on dialysis [facility])    MWF and goes to [facility] (06/11/2016)  . Family history of anesthesia complication    mom slow to wake up  . GERD (gastroesophageal reflux disease)    takes Omeprazole as needed  . Headache(784.0)    occasionally  . History of hiatal hernia   . Hyperparathyroidism ([place])   . Hypertension   . Joint pain   . Joint swelling   . Morbid obesity ([place])   . PONV (postoperative nausea and vomiting)   . Renal insufficiency    Past Surgical History:  Procedure Laterality Date  . AV FISTULA PLACEMENT Bilateral  2005,2007   Uses Right arm  . BREATH TEK H PYLORI N/A 05/15/2014   Procedure: BREATH TEK H PYLORI;  Surgeon: Alphonsa Overall, MD;  Location: [facility];  Service: General;  Laterality: N/A;  . ESOPHAGOGASTRODUODENOSCOPY N/A 12/07/2014   Procedure: ESOPHAGOGASTRODUODENOSCOPY (EGD);  Surgeon: Alphonsa Overall, MD;  Location[facility];  Service: General;  Laterality: N/A;  . ESOPHAGOGASTRODUODENOSCOPY N/A 12/17/2014   Procedure: ESOPHAGOGASTRODUODENOSCOPY (EGD);  Surgeon: Alphonsa Overall, MD;  Location: [place];  Service: General;  Laterality: N/A;  . ESOPHAGOGASTRODUODENOSCOPY N/A 08/16/2016   Procedure: ESOPHAGOGASTRODUODENOSCOPY (EGD);  Surgeon: Jerene Bears, MD;  Location: [place];  Service: Gastroenterology;  Laterality: N/A;  . HERNIA REPAIR  11/2014   w/lap band OR  . JOINT REPLACEMENT    . KNEE ARTHROSCOPY Left 2007  . LAPAROSCOPIC GASTRIC BANDING N/A 11/12/2014   Procedure: LAPAROSCOPIC GASTRIC BANDING;  Surgeon: Alphonsa Overall, MD;  Location: [facility];  Service: General;  Laterality: N/A;  . PARATHYROIDECTOMY N/A 03/30/2013   Procedure: TOTAL PARATHYROIDECTOMY WITH AUTOTRANSPLANT;  Surgeon: Earnstine Regal, MD;  Location: [place];  Service: General;  Laterality: N/A;  Autotransplant to left lower arm.  Marland Kitchen TOTAL KNEE ARTHROPLASTY Right 03/27/2014   Procedure: UNICOMPARTMENTAL ARTHROPLASTY;  Surgeon: Meredith Pel, MD;  Location: [place];  Service: Orthopedics;  Laterality: Right;   Family History  Problem Relation Age of Onset  . Hypertension Mother   . Diabetes Father   . Kidney Stones Sister   . Diabetes Maternal Grandmother   . Liver cancer Maternal Uncle    Social History:  reports that he  quit smoking about 21 months ago. His smoking use included Cigarettes. He has a 2.60 pack-year smoking history. He has never used smokeless tobacco. He reports that he does not drink alcohol or use drugs. Allergies  Allergen Reactions  . Dilaudid [Hydromorphone Hcl] Other (See Comments)    Patient  became hypoxic to 60% with dilaudid 1mg  IV   Prior to Admission medications   Medication Sig Start Date End Date Taking? Authorizing Provider  calcium carbonate (TUMS - DOSED IN MG ELEMENTAL CALCIUM) 500 MG chewable tablet Chew 2 tablets by mouth 2 (two) times daily as needed for indigestion or heartburn.    Yes Historical Provider, MD  glycopyrrolate (ROBINUL) 2 MG tablet Take 1 tablet (2 mg total) by mouth 2 (two) times daily. 01/25/15  Yes Inda Castle, MD  Hyoscyamine Sulfate 0.375 MG TBCR Take 1 tablet twice daily 01/22/15  Yes Inda Castle, MD  lidocaine (XYLOCAINE) 2 % jelly Apply 1 application topically as needed. Patient taking differently: Apply 1 application topically daily as needed (pain).  02/19/16  Yes Benjamin Cartner, [name]  methocarbamol (ROBAXIN) 500 MG tablet Take 1 tablet (500 mg total) by mouth every 8 (eight) hours as needed for muscle spasms. 06/15/16  Yes Theodis Blaze, MD  metoCLOPramide (REGLAN) 5 MG tablet Take 1 tablet (5 mg total) by mouth 2 (two) times daily before a meal. 08/16/16  Yes Florencia Reasons, MD  multivitamin (RENA-VIT) TABS tablet Take 1 tablet by mouth daily.   Yes Historical Provider, MD  omeprazole (PRILOSEC) 20 MG capsule Take 1 capsule (20 mg total) by mouth daily. 07/27/16  Yes Ezequiel Essex, MD  promethazine (PHENERGAN) 25 MG suppository Place 1 suppository (25 mg total) rectally every 6 (six) hours as needed. 07/29/16  Yes Duffy Bruce, MD  sevelamer carbonate (RENVELA) 2.4 G PACK Take 2.4 g by mouth 3 (three) times daily with meals.    Yes Historical Provider, MD  traMADol (ULTRAM) 50 MG tablet Take 1 tablet (50 mg total) by mouth every 8 (eight) hours as needed. Patient taking differently: Take 50 mg by mouth every 6 (six) hours as needed (for pain).  06/15/16  Yes Theodis Blaze, MD  VOLTAREN 1 % GEL Apply 4 g topically 4 (four) times daily. Patient taking differently: Apply 4 g topically as needed.  02/04/16  Yes April Palumbo, MD  albuterol  (PROVENTIL HFA;VENTOLIN HFA) 108 (90 Base) MCG/ACT inhaler Inhale 2 puffs into the lungs every 4 (four) hours as needed for wheezing or shortness of breath. 05/14/16   Lysbeth Penner, [name]   Current Facility-Administered Medications  Medication Dose Route Frequency Provider Last Rate Last Dose  . acetaminophen (TYLENOL) tablet 650 mg  650 mg Oral Q6H PRN Radene Gunning, NP       Or  . acetaminophen (TYLENOL) suppository 650 mg  650 mg Rectal Q6H PRN Lezlie Octave Black, NP      . albuterol (PROVENTIL) (2.5 MG/3ML) 0.083% nebulizer solution 3 mL  3 mL Inhalation Q4H PRN Radene Gunning, NP      . diphenhydrAMINE (BENADRYL) injection 12.5 mg  12.5 mg Intravenous Q6H PRN Radene Gunning, NP   12.5 mg at 10/08/16 1324  . glycopyrrolate (ROBINUL) tablet 2 mg  2 mg Oral BID Radene Gunning, NP   2 mg at 10/09/16 0836  . metoCLOPramide (REGLAN) injection 10 mg  10 mg Intravenous Q6H Radene Gunning, NP   10 mg at 10/09/16 0523  . morphine 4 MG/ML  injection 2 mg  2 mg Intravenous Q4H PRN Radene Gunning, NP   2 mg at 10/08/16 2020  . ondansetron (ZOFRAN) injection 4 mg  4 mg Intravenous Q6H PRN Radene Gunning, NP      . pantoprazole (PROTONIX) injection 40 mg  40 mg Intravenous Q24H Radene Gunning, NP   40 mg at 10/08/16 2027  . promethazine (PHENERGAN) suppository 25 mg  25 mg Rectal Q6H PRN Radene Gunning, NP      . sevelamer carbonate (RENVELA) powder PACK 2.4 g  2.4 g Oral TID WC Lezlie Octave Black, NP   2.4 g at 10/09/16 0836    ROS: As per HPI otherwise negative.  Physical Exam: Vitals:   10/09/16 0406 10/09/16 0436 10/09/16 0652 10/09/16 0915  BP: (!) 86/36 (!) 82/40 (!) 88/42 (!) 73/25  Pulse: 92   88  Resp: 20   19  Temp: 99 F (37.2 C)   98.2 F (36.8 C)  TempSrc: Oral   Oral  SpO2: 91%   98%  Weight:      Height:         General: Obese AAM NAD  Head: NCAT sclera not icteric MMM Neck: Supple. No JVD No masses Lungs: CTA bilaterally without wheezes, rales, or rhonchi. Breathing is unlabored. Heart:  RRR with S1 S2 Abdomen: soft NT + BS Lower extremities:without edema or ischemic changes, no open wounds  Neuro: A & O  X 3. Moves all extremities spontaneously. Psych:  Responds to questions appropriately with a normal affect. Dialysis Access: LUE AVF +thrill   Labs: Basic Metabolic Panel:  Recent Labs Lab 10/08/16 1443 10/08/16 2021 10/09/16 0421  NA 137 139 138  K 3.6 3.9 3.7  CL 96* 93* 99*  CO2  --  25 23  GLUCOSE 101* 71 95  BUN 41* 38* 51*  CREATININE 10.20* 9.81* 11.65*  CALCIUM  --  9.1 8.0*   Liver Function Tests:  Recent Labs Lab 10/08/16 2021  AST 15  ALT 18  ALKPHOS 57  BILITOT 0.8  PROT 8.7*  ALBUMIN 4.3    Recent Labs Lab 10/08/16 2021  LIPASE 30   No results for input(s): AMMONIA in the last 168 hours. CBC:  Recent Labs Lab 10/08/16 0953 10/08/16 1443 10/09/16 0421  WBC 10.2  --  11.1*  NEUTROABS 8.3*  --   --   HGB 15.6 14.6 12.1*  HCT 44.6 43.0 35.7*  MCV 91.0  --  91.1  PLT 211  --  187   Cardiac Enzymes: No results for input(s): CKTOTAL, CKMB, CKMBINDEX, TROPONINI in the last 168 hours. CBG: No results for input(s): GLUCAP in the last 168 hours. Iron Studies: No results for input(s): IRON, TIBC, TRANSFERRIN, FERRITIN in the last 72 hours. Studies/Results: Dg Abd Acute W/chest  Result Date: 10/08/2016 CLINICAL DATA:  Generalized abdominal pain and vomiting for 2 days EXAM: DG ABDOMEN ACUTE W/ 1V CHEST COMPARISON:  None. FINDINGS: Cardiac shadow is within normal limits. The lungs are well aerated bilaterally. No focal infiltrate is seen. Scattered large and small bowel gas is noted. A few scattered air-fluid levels are noted. No free air is seen. No obstructive changes are noted. No bony abnormality is seen. IMPRESSION: Scattered abdominal air-fluid levels without obstructive change. Electronically Signed   By: Inez Catalina M.D.   On: 10/08/2016 11:40   US Abdomen Limited Ruq  Result Date: 10/08/2016 CLINICAL DATA:  Right upper  quadrant pain for 2 days EXAM: US  ABDOMEN LIMITED - RIGHT UPPER QUADRANT COMPARISON:  None. FINDINGS: Gallbladder: Multiple calculi are noted. No gallbladder wall thickening or pericholecystic fluid is noted. Common bile duct: Diameter: 3.8 mm. Liver: No focal lesion identified. Within normal limits in parenchymal echogenicity. IMPRESSION: Cholelithiasis without complicating factors. Electronically Signed   By: Inez Catalina M.D.   On: 10/08/2016 11:54    Dialysis Orders:  [facility] MWFSat 4:45 hours, EDW 140 kg, 2K/2.5Ca, AVF, BFR 450/DFR 800 - Calcitriol 0.12mcg PO q HD - No ESA or IV iron - Heparin 12,000 units bolus   Assessment/Plan: 1.  Intractable nausea/vomiting - h/o gastroparesis -improved this am - per primary  2.  ESRD -  K+ 3.7 Use 4K bath  3.  Hypotension  - chronic does not tolerate midodrine  4. Volume - currently below EDW by weights here - no volume excess on exam  5.  Anemia  - Hgb 12.1 no need for ESA currently  6.  Metabolic bone disease -  Cont VDRA /binders  7.  Nutrition - Clear liquids currently/renal diet vitamins as tolerated  Plan - Currently below EDW with soft BP, labs stable - Plan next HD tomorrow - will place orders if still here otherwise can resume at OP center on schedule   Lynnda Child [name] [place] Pager [phone] 10/09/2016, 9:50 AM

## 2016-10-09 NOTE — Progress Notes (Signed)
[place] [facility] Cicio to be D/C'd Home per MD order.  Discussed prescriptions and follow up appointments with the patient. Prescriptions given to patient, medication list explained in detail. Pt verbalized understanding.  Allergies as of 10/09/2016      Reactions   Dilaudid [hydromorphone Hcl] Other (See Comments)   Patient became hypoxic to 60% with dilaudid 1mg  IV      Medication List    TAKE these medications   albuterol 108 (90 Base) MCG/ACT inhaler Commonly known as:  PROVENTIL HFA;VENTOLIN HFA Inhale 2 puffs into the lungs every 4 (four) hours as needed for wheezing or shortness of breath.   calcium carbonate 500 MG chewable tablet Commonly known as:  TUMS - dosed in mg elemental calcium Chew 2 tablets by mouth 2 (two) times daily as needed for indigestion or heartburn.   glycopyrrolate 2 MG tablet Commonly known as:  ROBINUL Take 1 tablet (2 mg total) by mouth 2 (two) times daily.   Hyoscyamine Sulfate 0.375 MG Tbcr Take 1 tablet twice daily   lidocaine 2 % jelly Commonly known as:  XYLOCAINE Apply 1 application topically as needed. What changed:  when to take this  reasons to take this   methocarbamol 500 MG tablet Commonly known as:  ROBAXIN Take 1 tablet (500 mg total) by mouth every 8 (eight) hours as needed for muscle spasms.   metoCLOPramide 5 MG tablet Commonly known as:  REGLAN Take 1 tablet (5 mg total) by mouth 2 (two) times daily before a meal.   multivitamin Tabs tablet Take 1 tablet by mouth daily.   omeprazole 20 MG capsule Commonly known as:  PRILOSEC Take 1 capsule (20 mg total) by mouth daily.   ondansetron 4 MG tablet Commonly known as:  ZOFRAN Take 1 tablet (4 mg total) by mouth every 8 (eight) hours as needed for nausea or vomiting.   promethazine 25 MG suppository Commonly known as:  PHENERGAN Place 1 suppository (25 mg total) rectally every 6 (six) hours as needed.   sevelamer carbonate 2.4 g Pack Commonly known as:  RENVELA Take 2.4 g  by mouth 3 (three) times daily with meals.   traMADol 50 MG tablet Commonly known as:  ULTRAM Take 1 tablet (50 mg total) by mouth every 8 (eight) hours as needed. What changed:  when to take this  reasons to take this   VOLTAREN 1 % Gel Generic drug:  diclofenac sodium Apply 4 g topically 4 (four) times daily. What changed:  when to take this  reasons to take this       Vitals:   10/09/16 0652 10/09/16 0915  BP: (!) 88/42 (!) 73/25  Pulse:  88  Resp:  19  Temp:  98.2 F (36.8 C)    Skin clean, dry and intact without evidence of skin break down, no evidence of skin tears noted. IV catheter discontinued intact. Site without signs and symptoms of complications. Dressing and pressure applied. Pt denies pain at this time. No complaints noted.  An After Visit Summary was printed and given to the patient. Patient escorted via [place], and D/C home via private auto.  Retta Mac BSN, RN

## 2016-11-17 ENCOUNTER — Encounter (HOSPITAL_COMMUNITY): Payer: Self-pay | Admitting: Emergency Medicine

## 2016-11-17 ENCOUNTER — Emergency Department (HOSPITAL_COMMUNITY)
Admission: EM | Admit: 2016-11-17 | Discharge: 2016-11-17 | Disposition: A | Discharge status: Home / Self Care | Payer: Medicare Other | Source: Home / Self Care | Attending: Emergency Medicine | Admitting: Emergency Medicine

## 2016-11-17 DIAGNOSIS — R1011 Right upper quadrant pain: Secondary | ICD-10-CM | POA: Diagnosis not present

## 2016-11-17 DIAGNOSIS — Z87891 Personal history of nicotine dependence: Secondary | ICD-10-CM

## 2016-11-17 DIAGNOSIS — R1115 Cyclical vomiting syndrome unrelated to migraine: Secondary | ICD-10-CM

## 2016-11-17 DIAGNOSIS — Z79899 Other long term (current) drug therapy: Secondary | ICD-10-CM

## 2016-11-17 DIAGNOSIS — N186 End stage renal disease: Secondary | ICD-10-CM | POA: Insufficient documentation

## 2016-11-17 DIAGNOSIS — Z96651 Presence of right artificial knee joint: Secondary | ICD-10-CM

## 2016-11-17 DIAGNOSIS — I120 Hypertensive chronic kidney disease with stage 5 chronic kidney disease or end stage renal disease: Secondary | ICD-10-CM

## 2016-11-17 DIAGNOSIS — K8065 Calculus of gallbladder and bile duct with chronic cholecystitis with obstruction: Secondary | ICD-10-CM | POA: Diagnosis not present

## 2016-11-17 DIAGNOSIS — G43A0 Cyclical vomiting, not intractable: Secondary | ICD-10-CM

## 2016-11-17 DIAGNOSIS — Z992 Dependence on renal dialysis: Secondary | ICD-10-CM | POA: Insufficient documentation

## 2016-11-17 LAB — COMPREHENSIVE METABOLIC PANEL
ALT: 19 U/L (ref 17–63)
ANION GAP: 22 — AB (ref 5–15)
AST: 17 U/L (ref 15–41)
Albumin: 4.5 g/dL (ref 3.5–5.0)
Alkaline Phosphatase: 64 U/L (ref 38–126)
BILIRUBIN TOTAL: 0.9 mg/dL (ref 0.3–1.2)
BUN: 55 mg/dL — ABNORMAL HIGH (ref 6–20)
CALCIUM: 8.2 mg/dL — AB (ref 8.9–10.3)
CO2: 25 mmol/L (ref 22–32)
Chloride: 92 mmol/L — ABNORMAL LOW (ref 101–111)
Creatinine, Ser: 13.93 mg/dL — ABNORMAL HIGH (ref 0.61–1.24)
GFR, EST AFRICAN AMERICAN: 5 mL/min — AB (ref >60)
GFR, EST NON AFRICAN AMERICAN: 4 mL/min — AB (ref >60)
GLUCOSE: 121 mg/dL — AB (ref 65–99)
Potassium: 3.9 mmol/L (ref 3.5–5.1)
Sodium: 139 mmol/L (ref 135–145)
TOTAL PROTEIN: 8.7 g/dL — AB (ref 6.5–8.1)

## 2016-11-17 LAB — CBC
HCT: 41 % (ref 39.0–52.0)
HEMOGLOBIN: 14.1 g/dL (ref 13.0–17.0)
MCH: 30.8 pg (ref 26.0–34.0)
MCHC: 34.4 g/dL (ref 30.0–36.0)
MCV: 89.5 fL (ref 78.0–100.0)
Platelets: 213 10*3/uL (ref 150–400)
RBC: 4.58 MIL/uL (ref 4.22–5.81)
RDW: 14 % (ref 11.5–15.5)
WBC: 8.9 10*3/uL (ref 4.0–10.5)

## 2016-11-17 LAB — LIPASE, BLOOD: Lipase: 24 U/L (ref 11–51)

## 2016-11-17 MED ORDER — PROMETHAZINE HCL 25 MG PO TABS: 25.0000 mg | ORAL_TABLET | Freq: Four times a day (QID) | ORAL | 0 refills | Status: DC | PRN

## 2016-11-17 MED ORDER — MORPHINE SULFATE (PF) 4 MG/ML IV SOLN
4.0000 mg | Freq: Once | INTRAVENOUS | Status: AC
  Administered 2016-11-17: 4 mg via INTRAVENOUS
  Filled 2016-11-17: qty 1

## 2016-11-17 MED ORDER — HALOPERIDOL LACTATE 5 MG/ML IJ SOLN
5.0000 mg | Freq: Once | INTRAMUSCULAR | Status: AC
  Administered 2016-11-17: 5 mg via INTRAVENOUS
  Filled 2016-11-17: qty 1

## 2016-11-17 MED ORDER — PROMETHAZINE HCL 25 MG/ML IJ SOLN
12.5000 mg | Freq: Once | INTRAMUSCULAR | Status: AC
  Administered 2016-11-17: 12.5 mg via INTRAVENOUS
  Filled 2016-11-17: qty 1

## 2016-11-17 MED ORDER — PROMETHAZINE HCL 25 MG RE SUPP: 25.0000 mg | Freq: Four times a day (QID) | RECTAL | 0 refills | Status: DC | PRN

## 2016-11-17 NOTE — ED Notes (Signed)
Pt taking po fluids when prompted. No nausea noted. Rouses to voice and responds that nausea and pain are gone.

## 2016-11-17 NOTE — ED Notes (Signed)
Pt took a small sip of Ginger Ale. States "it made his belly hurt".

## 2016-11-17 NOTE — ED Triage Notes (Signed)
Pt arrives by ems for lower abd pain with n/v after eating dinner last night. Pt rates pain at 10/10.

## 2016-11-17 NOTE — ED Notes (Signed)
IV attempt x2 without success.

## 2016-11-17 NOTE — ED Provider Notes (Signed)
Emergency Department Provider Note  By signing my name below, I, Collene Leyden, attest that this documentation has been prepared under the direction and in the presence of Margette Fast, MD. Electronically Signed: Collene Leyden, Scribe. 11/17/16. 12:21 PM.  I have reviewed the triage vital signs and the nursing notes.   HISTORY  Chief Complaint Abdominal Pain   HPI: [name] is a 31 y.o. male with a history of gastroparesis, gallstones, and GIB, who presents to the Emergency Department complaining of sudden-onset, constant generalized abdominal pain that began yesterday evening at 9:30 PM. Patient reports a history of gastroparesis, in which that patient states his symptoms are similar. Patient is currently on dialysis, last HD was yesterday. Patient reports eating back bones and rice yesterday for dinner. Patient reports associated emesis. No modifying factors indicated. Patient has a surgical history of a laparoscopic gastric binding and multiple EGD's in the past. Patient denies any diarrhea.   Past Medical History:  Diagnosis Date  . Arthritis    "all over" (06/11/2016)  . Chronic lower back pain   . Complication of anesthesia    A little while to wake up after knee surgery in 2008  . Dizziness    when coming off of dialysis  . ESRD (end stage renal disease) on dialysis [facility])    MWF and goes to [facility] (06/11/2016)  . Family history of anesthesia complication    mom slow to wake up  . GERD (gastroesophageal reflux disease)    takes Omeprazole as needed  . Headache(784.0)    occasionally  . History of hiatal hernia   . Hyperparathyroidism ([place])   . Hypertension   . Joint pain   . Joint swelling   . Morbid obesity ([place])   . PONV (postoperative nausea and vomiting)   . Renal insufficiency     Patient Active Problem List   Diagnosis Date Noted  . Gastroparesis   . Intractable nausea and vomiting 10/08/2016  . Hematemesis 08/14/2016  . GERD  (gastroesophageal reflux disease) 08/14/2016  . Essential hypertension 08/14/2016  . GIB (gastrointestinal bleeding) 08/14/2016  . SIRS (systemic inflammatory response syndrome) ([place]) 08/14/2016  . CKD (chronic kidney disease) stage V requiring chronic dialysis ([place]) 06/11/2016  . Non-intractable vomiting with nausea 06/11/2016  . Hepatic steatosis 06/11/2016  . Cholelithiasis 06/11/2016  . Localized osteoarthritis of left knee 07/11/2015  . Abdominal pain, epigastric 12/05/2014  . Lapband April 2016 11/27/2014  . Arthritis of knee 03/27/2014  . Morbid obesity, weight 291, BMI - 47 02/15/2014  . ESRD on dialysis ([place]) 07/12/2013  . Hyperparathyroidism, secondary ([place]) 03/14/2013    Past Surgical History:  Procedure Laterality Date  . AV FISTULA PLACEMENT Bilateral 2005,2007   Uses Right arm  . BREATH TEK H PYLORI N/A 05/15/2014   Procedure: BREATH TEK H PYLORI;  Surgeon: Alphonsa Overall, MD;  Location: [facility];  Service: General;  Laterality: N/A;  . ESOPHAGOGASTRODUODENOSCOPY N/A 12/07/2014   Procedure: ESOPHAGOGASTRODUODENOSCOPY (EGD);  Surgeon: Alphonsa Overall, MD;  Location: [facility];  Service: General;  Laterality: N/A;  . ESOPHAGOGASTRODUODENOSCOPY N/A 12/17/2014   Procedure: ESOPHAGOGASTRODUODENOSCOPY (EGD);  Surgeon: Alphonsa Overall, MD;  Location: [place];  Service: General;  Laterality: N/A;  . ESOPHAGOGASTRODUODENOSCOPY N/A 08/16/2016   Procedure: ESOPHAGOGASTRODUODENOSCOPY (EGD);  Surgeon: Jerene Bears, MD;  Location: [place];  Service: Gastroenterology;  Laterality: N/A;  . HERNIA REPAIR  11/2014   w/lap band OR  . JOINT REPLACEMENT    . KNEE ARTHROSCOPY Left 2007  .  LAPAROSCOPIC GASTRIC BANDING N/A 11/12/2014   Procedure: LAPAROSCOPIC GASTRIC BANDING;  Surgeon: Alphonsa Overall, MD;  Location: [facility];  Service: General;  Laterality: N/A;  . PARATHYROIDECTOMY N/A 03/30/2013   Procedure: TOTAL PARATHYROIDECTOMY WITH AUTOTRANSPLANT;  Surgeon: Earnstine Regal, MD;  Location: [place];  Service: General;  Laterality: N/A;  Autotransplant to left lower arm.  Marland Kitchen TOTAL KNEE ARTHROPLASTY Right 03/27/2014   Procedure: UNICOMPARTMENTAL ARTHROPLASTY;  Surgeon: Meredith Pel, MD;  Location: [place];  Service: Orthopedics;  Laterality: Right;   Current Outpatient Rx  . Order #: [id number] Class: Print  . Order #: [id number] Class: Historical Med  . Order #: [id number] Class: Normal  . Order #: [id number] Class: Normal  . Order #: [id number] Class: Print  . Order #: [id number] Class: Print  . Order #: [id number] Class: Normal  . Order #: [id number] Class: Historical Med  . Order #: [id number] Class: Print  . Order #: [id number] Class: Normal  . Order #: [id number] Class: Print  . Order #: [id number] Class: Print  . Order #: [id number] Class: Historical Med  . Order #: [id number] Class: Print  . Order #: [id number] Class: Print    Allergies Dilaudid [hydromorphone hcl]  Family History  Problem Relation Age of Onset  . Hypertension Mother   . Diabetes Father   . Kidney Stones Sister   . Diabetes Maternal Grandmother   . Liver cancer Maternal Uncle     Social History Social History  Substance Use Topics  . Smoking status: Former Smoker    Packs/day: 0.20    Years: 13.00    Types: Cigarettes    Quit date: 12/17/2014  . Smokeless tobacco: Never Used  . Alcohol use No    Review of Systems Constitutional: No fever/chills Eyes: No visual changes. ENT: No sore throat. Cardiovascular: Denies chest pain. Respiratory: Denies shortness of breath. Gastrointestinal: + abdominal pain.  + nausea, + vomiting.  No diarrhea.  No constipation. Genitourinary: Negative for dysuria. Musculoskeletal: Negative for back pain. Skin: Negative for rash. Neurological: Negative for headaches, focal weakness or numbness. 10-point ROS otherwise negative.  ____________________________________________   PHYSICAL EXAM:  VITAL SIGNS: ED Triage Vitals  Enc Vitals Group     BP 11/17/16 1149 113/76      Pulse Rate 11/17/16 1149 86     Resp 11/17/16 1149 16     Temp 11/17/16 1149 97 F (36.1 C)     Temp Source 11/17/16 1149 Oral     Pain Score 11/17/16 1147 10   Constitutional: Alert and oriented. Patient appears distressed.  Eyes: Conjunctivae are normal. Head: Atraumatic. Nose: No congestion/rhinnorhea. Mouth/Throat: Mucous membranes are moist.  Oropharynx non-erythematous. Neck: No stridor.   Cardiovascular: Normal rate, regular rhythm. Good peripheral circulation. Grossly normal heart sounds.   Respiratory: Normal respiratory effort.  No retractions. Lungs CTAB. Gastrointestinal: Soft with mild diffuse tenderness to palpation. No rebound or guarding. Tenderness increased in the epigastric region. No distention.  Musculoskeletal: No lower extremity tenderness nor edema. No gross deformities of extremities. Neurologic:  Normal speech and language. No gross focal neurologic deficits are appreciated.  Skin:  Skin is warm, dry and intact. No rash noted.  ____________________________________________   LABS (all labs ordered are listed, but only abnormal results are displayed)  Labs Reviewed  COMPREHENSIVE METABOLIC PANEL - Abnormal; Notable for the following:       Result Value   Chloride 92 (*)    Glucose, Bld 121 (*)    BUN 55 (*)    Creatinine, Ser 13.93 (*)    Calcium 8.2 (*)  Total Protein 8.7 (*)    GFR calc non Af Amer 4 (*)    GFR calc Af Amer 5 (*)    Anion gap 22 (*)    All other components within normal limits  LIPASE, BLOOD  CBC    ____________________________________________  RADIOLOGY  None ____________________________________________   PROCEDURES  Procedure(s) performed:   Procedures  None ____________________________________________   INITIAL IMPRESSION / ASSESSMENT AND PLAN / ED COURSE  Pertinent labs & imaging results that were available during my care of the patient were reviewed by me and considered in my medical decision making (see  chart for details).  Patient presents to the emergency department for evaluation of sudden onset epigastric abdominal discomfort since last night. He has had multiple episodes of vomiting. Patient has a history of gastroparesis and states this feels similar to his gastroparesis discomfort. He has a remote history of pancreatitis and was also found to have gallstones on recent right upper quadrant ultrasound with no evidence of cholecystitis. Patient had normal run of dialysis yesterday. He is moaning and appears uncomfortable. Clinically suspect gastroparesis as the cause of his symptoms. He was admitted last month with intractable nausea and vomiting. Will give Haldol, Phenergan, morphine and follow labs.  01:36 PM Patient is resting comfortably. Normal bilirubin and liver enzymes. Other lab abnormalities expected with ESRD. No indication for emergent HD. Plan for PO challenge.   03:15 PM  patient has had water to drink and is keeping it down without vomiting or additional pain. He has been observed after completing his PO challenge for 30 minutes and continues to feel well. Will discharge home with Phenergan and PCP follow up plan. Discussed return precautions in detail with patient and girlfriend at bedside.   At this time, I do not feel there is any life-threatening condition present. I have reviewed and discussed all results (EKG, imaging, lab, urine as appropriate), exam findings with patient. I have reviewed nursing notes and appropriate previous records.  I feel the patient is safe to be discharged home without further emergent workup. Discussed usual and customary return precautions. Patient and family (if present) verbalize understanding and are comfortable with this plan.  Patient will follow-up with their primary care provider. If they do not have a primary care provider, information for follow-up has been provided to them. All questions have been  answered.  ____________________________________________  FINAL CLINICAL IMPRESSION(S) / ED DIAGNOSES  Final diagnoses:  Non-intractable cyclical vomiting with nausea     MEDICATIONS GIVEN DURING THIS VISIT:  Medications  haloperidol lactate (HALDOL) injection 5 mg (5 mg Intravenous Given 11/17/16 1241)  promethazine (PHENERGAN) injection 12.5 mg (12.5 mg Intravenous Given 11/17/16 1245)  morphine 4 MG/ML injection 4 mg (4 mg Intravenous Given 11/17/16 1237)     NEW OUTPATIENT MEDICATIONS STARTED DURING THIS VISIT:  New Prescriptions   PROMETHAZINE (PHENERGAN) 25 MG SUPPOSITORY    Place 1 suppository (25 mg total) rectally every 6 (six) hours as needed for nausea or vomiting.   PROMETHAZINE (PHENERGAN) 25 MG TABLET    Take 1 tablet (25 mg total) by mouth every 6 (six) hours as needed for nausea or vomiting.    I personally performed the services described in this documentation, which was scribed in my presence. The recorded information has been reviewed and is accurate.     Nanda Quinton, MD Emergency Medicine    Margette Fast, MD 11/17/16 [phone]

## 2016-11-17 NOTE — Discharge Instructions (Signed)
You have been seen in the Emergency Department (ED) today for nausea and vomiting.  Your work up today has not shown a clear cause for your symptoms. You have been prescribed Phenergan; please use as prescribed as needed for your nausea.  Use the phenergan suppository only if you are unable to eat or drink because of vomiting.   Follow up with your doctor as soon as possible regarding today?s emergent visit and your symptoms of nausea.   Return to the ED if you develop abdominal, bloody vomiting, bloody diarrhea, if you are unable to tolerate fluids due to vomiting, or if you develop other symptoms that concern you.

## 2016-11-17 NOTE — ED Notes (Signed)
Pt is a dialysis pt, last dialyzed yesterday. Started last PM with LUQ abd pain. Moaning with pain. Reports last BM normal. Pt in anuric. Skin w/d. Pt able to move to stretcher with assistance. Girlfriend at bedside.

## 2016-11-18 ENCOUNTER — Encounter (HOSPITAL_COMMUNITY): Payer: Self-pay | Admitting: *Deleted

## 2016-11-18 DIAGNOSIS — K760 Fatty (change of) liver, not elsewhere classified: Secondary | ICD-10-CM | POA: Diagnosis present

## 2016-11-18 DIAGNOSIS — K2970 Gastritis, unspecified, without bleeding: Secondary | ICD-10-CM | POA: Diagnosis present

## 2016-11-18 DIAGNOSIS — M545 Low back pain: Secondary | ICD-10-CM | POA: Diagnosis present

## 2016-11-18 DIAGNOSIS — F17210 Nicotine dependence, cigarettes, uncomplicated: Secondary | ICD-10-CM | POA: Diagnosis present

## 2016-11-18 DIAGNOSIS — G8929 Other chronic pain: Secondary | ICD-10-CM | POA: Diagnosis present

## 2016-11-18 DIAGNOSIS — I120 Hypertensive chronic kidney disease with stage 5 chronic kidney disease or end stage renal disease: Secondary | ICD-10-CM | POA: Diagnosis present

## 2016-11-18 DIAGNOSIS — Z79899 Other long term (current) drug therapy: Secondary | ICD-10-CM

## 2016-11-18 DIAGNOSIS — M1990 Unspecified osteoarthritis, unspecified site: Secondary | ICD-10-CM | POA: Diagnosis present

## 2016-11-18 DIAGNOSIS — N2581 Secondary hyperparathyroidism of renal origin: Secondary | ICD-10-CM | POA: Diagnosis present

## 2016-11-18 DIAGNOSIS — N186 End stage renal disease: Secondary | ICD-10-CM | POA: Diagnosis present

## 2016-11-18 DIAGNOSIS — K219 Gastro-esophageal reflux disease without esophagitis: Secondary | ICD-10-CM | POA: Diagnosis present

## 2016-11-18 DIAGNOSIS — Z992 Dependence on renal dialysis: Secondary | ICD-10-CM

## 2016-11-18 DIAGNOSIS — Z6841 Body Mass Index (BMI) 40.0 and over, adult: Secondary | ICD-10-CM

## 2016-11-18 DIAGNOSIS — K2980 Duodenitis without bleeding: Secondary | ICD-10-CM | POA: Diagnosis present

## 2016-11-18 DIAGNOSIS — Z96653 Presence of artificial knee joint, bilateral: Secondary | ICD-10-CM | POA: Diagnosis present

## 2016-11-18 DIAGNOSIS — K8065 Calculus of gallbladder and bile duct with chronic cholecystitis with obstruction: Principal | ICD-10-CM | POA: Diagnosis present

## 2016-11-18 DIAGNOSIS — E872 Acidosis: Secondary | ICD-10-CM | POA: Diagnosis present

## 2016-11-18 DIAGNOSIS — D631 Anemia in chronic kidney disease: Secondary | ICD-10-CM | POA: Diagnosis present

## 2016-11-18 DIAGNOSIS — K5900 Constipation, unspecified: Secondary | ICD-10-CM | POA: Diagnosis not present

## 2016-11-18 DIAGNOSIS — K838 Other specified diseases of biliary tract: Secondary | ICD-10-CM | POA: Diagnosis present

## 2016-11-18 DIAGNOSIS — K3184 Gastroparesis: Secondary | ICD-10-CM | POA: Diagnosis present

## 2016-11-18 DIAGNOSIS — E785 Hyperlipidemia, unspecified: Secondary | ICD-10-CM | POA: Diagnosis present

## 2016-11-18 LAB — COMPREHENSIVE METABOLIC PANEL
ALBUMIN: 4.5 g/dL (ref 3.5–5.0)
ALT: 19 U/L (ref 17–63)
ANION GAP: 20 — AB (ref 5–15)
AST: 17 U/L (ref 15–41)
Alkaline Phosphatase: 70 U/L (ref 38–126)
BUN: 21 mg/dL — ABNORMAL HIGH (ref 6–20)
CALCIUM: 8.8 mg/dL — AB (ref 8.9–10.3)
CO2: 20 mmol/L — ABNORMAL LOW (ref 22–32)
Chloride: 91 mmol/L — ABNORMAL LOW (ref 101–111)
Creatinine, Ser: 8.53 mg/dL — ABNORMAL HIGH (ref 0.61–1.24)
GFR calc Af Amer: 9 mL/min — ABNORMAL LOW (ref >60)
GFR calc non Af Amer: 7 mL/min — ABNORMAL LOW (ref >60)
GLUCOSE: 106 mg/dL — AB (ref 65–99)
POTASSIUM: 3.7 mmol/L (ref 3.5–5.1)
SODIUM: 131 mmol/L — AB (ref 135–145)
Total Bilirubin: 1.4 mg/dL — ABNORMAL HIGH (ref 0.3–1.2)
Total Protein: 9.1 g/dL — ABNORMAL HIGH (ref 6.5–8.1)

## 2016-11-18 LAB — CBC
HCT: 42.3 % (ref 39.0–52.0)
HEMOGLOBIN: 14.5 g/dL (ref 13.0–17.0)
MCH: 30.9 pg (ref 26.0–34.0)
MCHC: 34.3 g/dL (ref 30.0–36.0)
MCV: 90 fL (ref 78.0–100.0)
Platelets: 207 10*3/uL (ref 150–400)
RBC: 4.7 MIL/uL (ref 4.22–5.81)
RDW: 14.1 % (ref 11.5–15.5)
WBC: 10.9 10*3/uL — ABNORMAL HIGH (ref 4.0–10.5)

## 2016-11-18 LAB — LIPASE, BLOOD: Lipase: 25 U/L (ref 11–51)

## 2016-11-18 NOTE — ED Triage Notes (Signed)
The pt is c/o abd pain since yesterday and  He was seen here for the same yesterday  c/o nausea and vomiting.  Dialysis pt lt arm fistula   Lt arm  Dialyzed today

## 2016-11-19 ENCOUNTER — Inpatient Hospital Stay (HOSPITAL_COMMUNITY)
Admission: EM | Admit: 2016-11-19 | Discharge: 2016-11-25 | DRG: 417 | Disposition: A | Discharge status: Home / Self Care | Payer: Medicare Other | Attending: Family Medicine | Admitting: Family Medicine

## 2016-11-19 ENCOUNTER — Observation Stay (HOSPITAL_COMMUNITY): Payer: Medicare Other

## 2016-11-19 ENCOUNTER — Encounter (HOSPITAL_COMMUNITY): Payer: Self-pay | Admitting: General Practice

## 2016-11-19 DIAGNOSIS — K2970 Gastritis, unspecified, without bleeding: Secondary | ICD-10-CM | POA: Diagnosis present

## 2016-11-19 DIAGNOSIS — N186 End stage renal disease: Secondary | ICD-10-CM

## 2016-11-19 DIAGNOSIS — R109 Unspecified abdominal pain: Secondary | ICD-10-CM | POA: Diagnosis not present

## 2016-11-19 DIAGNOSIS — Z9884 Bariatric surgery status: Secondary | ICD-10-CM

## 2016-11-19 DIAGNOSIS — K8020 Calculus of gallbladder without cholecystitis without obstruction: Secondary | ICD-10-CM | POA: Diagnosis not present

## 2016-11-19 DIAGNOSIS — I10 Essential (primary) hypertension: Secondary | ICD-10-CM

## 2016-11-19 DIAGNOSIS — E872 Acidosis: Secondary | ICD-10-CM | POA: Diagnosis present

## 2016-11-19 DIAGNOSIS — K3184 Gastroparesis: Secondary | ICD-10-CM | POA: Diagnosis present

## 2016-11-19 DIAGNOSIS — Z992 Dependence on renal dialysis: Secondary | ICD-10-CM

## 2016-11-19 DIAGNOSIS — K2990 Gastroduodenitis, unspecified, without bleeding: Secondary | ICD-10-CM

## 2016-11-19 DIAGNOSIS — E8729 Other acidosis: Secondary | ICD-10-CM | POA: Diagnosis present

## 2016-11-19 DIAGNOSIS — K838 Other specified diseases of biliary tract: Secondary | ICD-10-CM

## 2016-11-19 DIAGNOSIS — R1115 Cyclical vomiting syndrome unrelated to migraine: Secondary | ICD-10-CM

## 2016-11-19 DIAGNOSIS — K760 Fatty (change of) liver, not elsewhere classified: Secondary | ICD-10-CM | POA: Diagnosis present

## 2016-11-19 DIAGNOSIS — R1011 Right upper quadrant pain: Secondary | ICD-10-CM | POA: Diagnosis present

## 2016-11-19 DIAGNOSIS — Z419 Encounter for procedure for purposes other than remedying health state, unspecified: Secondary | ICD-10-CM

## 2016-11-19 DIAGNOSIS — K8050 Calculus of bile duct without cholangitis or cholecystitis without obstruction: Secondary | ICD-10-CM

## 2016-11-19 DIAGNOSIS — K219 Gastro-esophageal reflux disease without esophagitis: Secondary | ICD-10-CM | POA: Diagnosis present

## 2016-11-19 LAB — CBC WITH DIFFERENTIAL/PLATELET
BASOS ABS: 0 10*3/uL (ref 0.0–0.1)
Basophils Relative: 0 %
EOS ABS: 0.2 10*3/uL (ref 0.0–0.7)
EOS PCT: 2 %
HCT: 43.7 % (ref 39.0–52.0)
HEMOGLOBIN: 15.1 g/dL (ref 13.0–17.0)
LYMPHS PCT: 20 %
Lymphs Abs: 2.3 10*3/uL (ref 0.7–4.0)
MCH: 31.3 pg (ref 26.0–34.0)
MCHC: 34.6 g/dL (ref 30.0–36.0)
MCV: 90.7 fL (ref 78.0–100.0)
Monocytes Absolute: 1.6 10*3/uL — ABNORMAL HIGH (ref 0.1–1.0)
Monocytes Relative: 13 %
NEUTROS PCT: 65 %
Neutro Abs: 7.7 10*3/uL (ref 1.7–7.7)
PLATELETS: 205 10*3/uL (ref 150–400)
RBC: 4.82 MIL/uL (ref 4.22–5.81)
RDW: 13.9 % (ref 11.5–15.5)
WBC: 11.8 10*3/uL — ABNORMAL HIGH (ref 4.0–10.5)

## 2016-11-19 LAB — COMPREHENSIVE METABOLIC PANEL
ALT: 19 U/L (ref 17–63)
AST: 15 U/L (ref 15–41)
Albumin: 4.6 g/dL (ref 3.5–5.0)
Alkaline Phosphatase: 73 U/L (ref 38–126)
Anion gap: 22 — ABNORMAL HIGH (ref 5–15)
BILIRUBIN TOTAL: 1.3 mg/dL — AB (ref 0.3–1.2)
BUN: 27 mg/dL — AB (ref 6–20)
CHLORIDE: 88 mmol/L — AB (ref 101–111)
CO2: 25 mmol/L (ref 22–32)
CREATININE: 9.92 mg/dL — AB (ref 0.61–1.24)
Calcium: 8.9 mg/dL (ref 8.9–10.3)
GFR, EST AFRICAN AMERICAN: 7 mL/min — AB (ref >60)
GFR, EST NON AFRICAN AMERICAN: 6 mL/min — AB (ref >60)
Glucose, Bld: 90 mg/dL (ref 65–99)
POTASSIUM: 4.2 mmol/L (ref 3.5–5.1)
Sodium: 135 mmol/L (ref 135–145)
TOTAL PROTEIN: 9.4 g/dL — AB (ref 6.5–8.1)

## 2016-11-19 LAB — MRSA PCR SCREENING: MRSA by PCR: NEGATIVE

## 2016-11-19 MED ORDER — SEVELAMER CARBONATE 2.4 G PO PACK
2.4000 g | PACK | Freq: Three times a day (TID) | ORAL | Status: DC
  Filled 2016-11-19: qty 1

## 2016-11-19 MED ORDER — ONDANSETRON HCL 4 MG/2ML IJ SOLN
4.0000 mg | Freq: Once | INTRAMUSCULAR | Status: AC
  Administered 2016-11-19: 4 mg via INTRAVENOUS
  Filled 2016-11-19: qty 2

## 2016-11-19 MED ORDER — ALBUTEROL SULFATE HFA 108 (90 BASE) MCG/ACT IN AERS: 2.0000 | INHALATION_SPRAY | RESPIRATORY_TRACT | Status: DC | PRN

## 2016-11-19 MED ORDER — PANTOPRAZOLE SODIUM 40 MG IV SOLR
40.0000 mg | Freq: Two times a day (BID) | INTRAVENOUS | Status: DC
  Administered 2016-11-19 – 2016-11-23 (×8): 40 mg via INTRAVENOUS
  Filled 2016-11-19 (×9): qty 40

## 2016-11-19 MED ORDER — PROMETHAZINE HCL 25 MG PO TABS: 25.0000 mg | ORAL_TABLET | Freq: Four times a day (QID) | ORAL | Status: DC | PRN

## 2016-11-19 MED ORDER — DIPHENHYDRAMINE HCL 50 MG/ML IJ SOLN
25.0000 mg | Freq: Once | INTRAMUSCULAR | Status: AC
  Administered 2016-11-19: 25 mg via INTRAVENOUS
  Filled 2016-11-19: qty 1

## 2016-11-19 MED ORDER — GLYCOPYRROLATE 1 MG PO TABS: 2.0000 mg | ORAL_TABLET | Freq: Two times a day (BID) | ORAL | Status: DC

## 2016-11-19 MED ORDER — PIPERACILLIN-TAZOBACTAM 3.375 G IVPB
3.3750 g | Freq: Two times a day (BID) | INTRAVENOUS | Status: DC
Start: 2016-11-19 — End: 2016-11-24
  Administered 2016-11-19 – 2016-11-24 (×9): 3.375 g via INTRAVENOUS
  Filled 2016-11-19 (×12): qty 50

## 2016-11-19 MED ORDER — METOCLOPRAMIDE HCL 5 MG/ML IJ SOLN
10.0000 mg | Freq: Once | INTRAMUSCULAR | Status: AC
  Administered 2016-11-19: 10 mg via INTRAVENOUS
  Filled 2016-11-19: qty 2

## 2016-11-19 MED ORDER — ONDANSETRON HCL 4 MG PO TABS
4.0000 mg | ORAL_TABLET | Freq: Three times a day (TID) | ORAL | Status: DC | PRN
Start: 2016-11-19 — End: 2016-11-19

## 2016-11-19 MED ORDER — FENTANYL CITRATE (PF) 100 MCG/2ML IJ SOLN
50.0000 ug | Freq: Once | INTRAMUSCULAR | Status: AC
  Administered 2016-11-19: 50 ug via INTRAVENOUS
  Filled 2016-11-19: qty 2

## 2016-11-19 MED ORDER — ONDANSETRON HCL 4 MG/2ML IJ SOLN
4.0000 mg | Freq: Four times a day (QID) | INTRAMUSCULAR | Status: DC | PRN
  Administered 2016-11-21 – 2016-11-22 (×2): 4 mg via INTRAVENOUS
  Filled 2016-11-19 (×2): qty 2

## 2016-11-19 MED ORDER — PROMETHAZINE HCL 25 MG/ML IJ SOLN
12.5000 mg | Freq: Four times a day (QID) | INTRAMUSCULAR | Status: DC | PRN
  Administered 2016-11-19: 25 mg via INTRAVENOUS
  Administered 2016-11-21: 12.5 mg via INTRAVENOUS
  Administered 2016-11-21 – 2016-11-22 (×2): 25 mg via INTRAVENOUS
  Administered 2016-11-22: 12.5 mg via INTRAVENOUS
  Administered 2016-11-24: 25 mg via INTRAVENOUS
  Filled 2016-11-19 (×6): qty 1

## 2016-11-19 MED ORDER — RENA-VITE PO TABS: 1.0000 | ORAL_TABLET | Freq: Every day | ORAL | Status: DC

## 2016-11-19 MED ORDER — MORPHINE SULFATE (PF) 4 MG/ML IV SOLN
1.0000 mg | INTRAVENOUS | Status: AC | PRN
Start: 2016-11-19 — End: 2016-11-20
  Administered 2016-11-19 – 2016-11-20 (×3): 2 mg via INTRAVENOUS
  Filled 2016-11-19 (×3): qty 1

## 2016-11-19 MED ORDER — PANTOPRAZOLE SODIUM 40 MG PO TBEC: 40.0000 mg | DELAYED_RELEASE_TABLET | Freq: Every day | ORAL | Status: DC

## 2016-11-19 MED ORDER — LORAZEPAM 2 MG/ML IJ SOLN: 0.5000 mg | Freq: Once | INTRAMUSCULAR | Status: DC | PRN

## 2016-11-19 MED ORDER — CALCIUM CARBONATE ANTACID 500 MG PO CHEW
2.0000 | CHEWABLE_TABLET | Freq: Two times a day (BID) | ORAL | Status: DC | PRN
  Filled 2016-11-19: qty 2

## 2016-11-19 MED ORDER — METOCLOPRAMIDE HCL 10 MG PO TABS: 5.0000 mg | ORAL_TABLET | Freq: Two times a day (BID) | ORAL | Status: DC

## 2016-11-19 MED ORDER — KETOROLAC TROMETHAMINE 15 MG/ML IJ SOLN
15.0000 mg | Freq: Four times a day (QID) | INTRAMUSCULAR | Status: AC | PRN
  Administered 2016-11-20 – 2016-11-24 (×7): 15 mg via INTRAVENOUS
  Filled 2016-11-19 (×7): qty 1

## 2016-11-19 MED ORDER — CALCITRIOL 0.5 MCG PO CAPS
0.5000 ug | ORAL_CAPSULE | ORAL | Status: DC
  Administered 2016-11-20: 0.5 ug via ORAL
  Filled 2016-11-19: qty 1

## 2016-11-19 MED ORDER — METHOCARBAMOL 500 MG PO TABS: 500.0000 mg | ORAL_TABLET | Freq: Three times a day (TID) | ORAL | Status: DC | PRN

## 2016-11-19 MED ORDER — ALBUTEROL SULFATE (2.5 MG/3ML) 0.083% IN NEBU: 2.5000 mg | INHALATION_SOLUTION | RESPIRATORY_TRACT | Status: DC | PRN

## 2016-11-19 MED ORDER — FENTANYL CITRATE (PF) 100 MCG/2ML IJ SOLN
25.0000 ug | INTRAMUSCULAR | Status: DC | PRN
  Administered 2016-11-20 – 2016-11-24 (×8): 25 ug via INTRAVENOUS
  Filled 2016-11-19 (×9): qty 2

## 2016-11-19 MED ORDER — SODIUM CHLORIDE 0.9 % IV SOLN
INTRAVENOUS | Status: DC
  Administered 2016-11-19: 09:00:00 via INTRAVENOUS

## 2016-11-19 MED ORDER — HEPARIN SODIUM (PORCINE) 5000 UNIT/ML IJ SOLN
5000.0000 [IU] | Freq: Three times a day (TID) | INTRAMUSCULAR | Status: AC
  Administered 2016-11-19 – 2016-11-22 (×10): 5000 [IU] via SUBCUTANEOUS
  Filled 2016-11-19 (×10): qty 1

## 2016-11-19 MED ORDER — ACETAMINOPHEN 650 MG RE SUPP: 650.0000 mg | Freq: Four times a day (QID) | RECTAL | Status: DC | PRN

## 2016-11-19 MED ORDER — FENTANYL CITRATE (PF) 100 MCG/2ML IJ SOLN
100.0000 ug | Freq: Once | INTRAMUSCULAR | Status: AC
  Administered 2016-11-19: 100 ug via INTRAVENOUS
  Filled 2016-11-19: qty 2

## 2016-11-19 MED ORDER — ACETAMINOPHEN 325 MG PO TABS
650.0000 mg | ORAL_TABLET | Freq: Four times a day (QID) | ORAL | Status: DC | PRN
  Administered 2016-11-20: 650 mg via ORAL

## 2016-11-19 MED ORDER — METOCLOPRAMIDE HCL 5 MG/ML IJ SOLN
10.0000 mg | Freq: Three times a day (TID) | INTRAMUSCULAR | Status: DC
  Administered 2016-11-19 – 2016-11-24 (×14): 10 mg via INTRAVENOUS
  Filled 2016-11-19 (×15): qty 2

## 2016-11-19 NOTE — Progress Notes (Signed)
Pharmacy Antibiotic Note  [name] is a 31 y.o. male admitted on 11/19/2016 with RUQ abdominal pain and N/V.  Pharmacy has been consulted for Zosyn dosing for possible choledocholithiasis.  Patient has ESRD on MWF HD.  Plan: - Zosyn 3.375gm IV Q12H, 4 hr infusion - Pharmacy will sign off as dosage adjustment is unnecessary.  Thank you for the consult!   Height: 5\' 6"  (167.6 cm) Weight: (!) 310 lb (140.6 kg) IBW/kg (Calculated) : 63.8  Temp (24hrs), Avg:98.1 F (36.7 C), Min:97.8 F (36.6 C), Max:98.4 F (36.9 C)   Recent Labs Lab 11/17/16 1149 11/18/16 2211 11/19/16 0742  WBC 8.9 10.9* 11.8*  CREATININE 13.93* 8.53* 9.92*    Estimated Creatinine Clearance: 14.4 mL/min (A) (by C-G formula based on SCr of 9.92 mg/dL (H)).    Allergies  Allergen Reactions  . Dilaudid [Hydromorphone Hcl] Other (See Comments)    Patient became hypoxic to 60% with dilaudid 1mg  IV    Zosyn 4/19 >>    Rhythm Wigfall D. Mina Marble, PharmD, BCPS Pager:  [phone] 11/19/2016, 12:35 PM

## 2016-11-19 NOTE — ED Notes (Signed)
MD at bedside. 

## 2016-11-19 NOTE — ED Notes (Signed)
Attempted to call report

## 2016-11-19 NOTE — Care Management Note (Signed)
Case Management Note  Patient Details  Name: FRAZIER BALFOUR MRN: [id number] Date of Birth: [birth year]  Subjective/Objective:                    Action/Plan:   Expected Discharge Date:                  Expected Discharge Plan:  Home/Self Care  In-House Referral:     Discharge planning Services     Post Acute Care Choice:    Choice offered to:     DME Arranged:    DME Agency:     HH Arranged:    HH Agency:     Status of Service:  In process, will continue to follow  If discussed at Long Length of Stay Meetings, dates discussed:    Additional Comments:  Marilu Favre, RN 11/19/2016, 2:50 PM

## 2016-11-19 NOTE — ED Notes (Signed)
Patient transported to Ultrasound 

## 2016-11-19 NOTE — Progress Notes (Signed)
[facility]   DILON LANK is a 31 y.o. 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  We are notified for need fo HD tomor. Noted  Full tx time yesterday on HD co severe cramps left above edw as noted below. No po intake over 48hrs  With Vomiting yesterday  / May need slightly higher EDW  . Run even in am .   Dialysis Orders: Center: Home   on MWF . EDW 141 , (last 3 tx post wts= 142.2  Full Tx , 143.6 half tx time , 144.2 half tx time HD Bath 2k, 2.5 ca  Time 4hr 8min Heparin 12000. Access LFA AVF  BFR 450   DFR 800    Calcitriol PO 0.5 mcg /HD    [name], [name] [place] [phone] 11/19/2016, 11:11 AM   Kelly Splinter MD [facility] pgr [phone]   11/19/2016, 2:19 PM

## 2016-11-19 NOTE — ED Notes (Signed)
Dr. Merrell at bedside. 

## 2016-11-19 NOTE — ED Provider Notes (Signed)
[facility] DEPT Provider Note   CSN: [id number] Arrival date & time: 11/18/16  2157   By signing my name below, I, [name], attest that this documentation has been prepared under the direction and in the presence of Alex Fraise, MD. Electronically signed, [name], ED Scribe. 11/19/16. 1:16 AM.   History   Chief Complaint Chief Complaint  Patient presents with  . Abdominal Pain   The history is provided by the patient and medical records. No language interpreter was used.  Abdominal Pain   This is a recurrent problem. The current episode started yesterday. The problem occurs constantly. The problem has been gradually worsening. Associated with: dialysis. The pain is located in the generalized abdominal region. The pain is at a severity of 10/10. The pain is severe. Associated symptoms include nausea, vomiting and constipation. Pertinent negatives include fever. The symptoms are aggravated by certain positions, deep breathing, palpation and activity. His past medical history is significant for GERD.    [name] is a 31 y.o. male with h/o HTN, ESRD and gerd, transported via family to the Emergency Department with concern for constant recurrent abdominal pain onset yesterday following dialysis. He notes associated vomit with spots of blood and constipation x 4 days. Pt describes 10/10 upper and lower pain that is worsened with movement. No other modifying factors noted. He adds he was seen in [facility] ED 2 days ago with similar symptoms, treated with morphine and Reglan. Fistula to L arm for dialysis noted; pt last dialyzed yesterday completely. No chest pain, SOB or any other complaints at this time.   Past Medical History:  Diagnosis Date  . Arthritis    "all over" (06/11/2016)  . Chronic lower back pain   . Complication of anesthesia    A little while to wake up after knee surgery in 2008  . Dizziness    when coming off of dialysis  . ESRD (end stage renal disease)  on dialysis [facility])    MWF and goes to [facility] (06/11/2016)  . Family history of anesthesia complication    mom slow to wake up  . GERD (gastroesophageal reflux disease)    takes Omeprazole as needed  . Headache(784.0)    occasionally  . History of hiatal hernia   . Hyperparathyroidism ([place])   . Hypertension   . Joint pain   . Joint swelling   . Morbid obesity ([place])   . PONV (postoperative nausea and vomiting)   . Renal insufficiency     Patient Active Problem List   Diagnosis Date Noted  . Gastroparesis   . Intractable nausea and vomiting 10/08/2016  . Hematemesis 08/14/2016  . GERD (gastroesophageal reflux disease) 08/14/2016  . Essential hypertension 08/14/2016  . GIB (gastrointestinal bleeding) 08/14/2016  . SIRS (systemic inflammatory response syndrome) ([place]) 08/14/2016  . CKD (chronic kidney disease) stage V requiring chronic dialysis ([place]) 06/11/2016  . Non-intractable vomiting with nausea 06/11/2016  . Hepatic steatosis 06/11/2016  . Cholelithiasis 06/11/2016  . Localized osteoarthritis of left knee 07/11/2015  . Abdominal pain, epigastric 12/05/2014  . Lapband April 2016 11/27/2014  . Arthritis of knee 03/27/2014  . Morbid obesity, weight 291, BMI - 47 02/15/2014  . ESRD on dialysis ([place]) 07/12/2013  . Hyperparathyroidism, secondary ([place]) 03/14/2013    Past Surgical History:  Procedure Laterality Date  . AV FISTULA PLACEMENT Bilateral 2005,2007   Uses Right arm  . BREATH TEK H PYLORI N/A 05/15/2014   Procedure: BREATH TEK H PYLORI;  Surgeon: Shanon Brow  Lucia Gaskins, MD;  Location: [facility];  Service: General;  Laterality: N/A;  . ESOPHAGOGASTRODUODENOSCOPY N/A 12/07/2014   Procedure: ESOPHAGOGASTRODUODENOSCOPY (EGD);  Surgeon: Alphonsa Overall, MD;  Location: [facility];  Service: General;  Laterality: N/A;  . ESOPHAGOGASTRODUODENOSCOPY N/A 12/17/2014   Procedure: ESOPHAGOGASTRODUODENOSCOPY (EGD);  Surgeon: Alphonsa Overall, MD;  Location: [place];  Service: General;   Laterality: N/A;  . ESOPHAGOGASTRODUODENOSCOPY N/A 08/16/2016   Procedure: ESOPHAGOGASTRODUODENOSCOPY (EGD);  Surgeon: Jerene Bears, MD;  Location: [place];  Service: Gastroenterology;  Laterality: N/A;  . HERNIA REPAIR  11/2014   w/lap band OR  . JOINT REPLACEMENT    . KNEE ARTHROSCOPY Left 2007  . LAPAROSCOPIC GASTRIC BANDING N/A 11/12/2014   Procedure: LAPAROSCOPIC GASTRIC BANDING;  Surgeon: Alphonsa Overall, MD;  Location: [facility];  Service: General;  Laterality: N/A;  . PARATHYROIDECTOMY N/A 03/30/2013   Procedure: TOTAL PARATHYROIDECTOMY WITH AUTOTRANSPLANT;  Surgeon: Earnstine Regal, MD;  Location: [place];  Service: General;  Laterality: N/A;  Autotransplant to left lower arm.  Marland Kitchen TOTAL KNEE ARTHROPLASTY Right 03/27/2014   Procedure: UNICOMPARTMENTAL ARTHROPLASTY;  Surgeon: Meredith Pel, MD;  Location: [place];  Service: Orthopedics;  Laterality: Right;       Home Medications    Prior to Admission medications   Medication Sig Start Date End Date Taking? Authorizing Provider  albuterol (PROVENTIL HFA;VENTOLIN HFA) 108 (90 Base) MCG/ACT inhaler Inhale 2 puffs into the lungs every 4 (four) hours as needed for wheezing or shortness of breath. 05/14/16   Lysbeth Penner, [name]  calcium carbonate (TUMS - DOSED IN MG ELEMENTAL CALCIUM) 500 MG chewable tablet Chew 2 tablets by mouth 2 (two) times daily as needed for indigestion or heartburn.     Historical Provider, MD  glycopyrrolate (ROBINUL) 2 MG tablet Take 1 tablet (2 mg total) by mouth 2 (two) times daily. 01/25/15   Inda Castle, MD  Hyoscyamine Sulfate 0.375 MG TBCR Take 1 tablet twice daily 01/22/15   Inda Castle, MD  lidocaine (XYLOCAINE) 2 % jelly Apply 1 application topically as needed. Patient taking differently: Apply 1 application topically daily as needed (pain).  02/19/16   Comer Locket, [name]  methocarbamol (ROBAXIN) 500 MG tablet Take 1 tablet (500 mg total) by mouth every 8 (eight) hours as needed for muscle spasms.  06/15/16   Theodis Blaze, MD  metoCLOPramide (REGLAN) 5 MG tablet Take 1 tablet (5 mg total) by mouth 2 (two) times daily before a meal. 10/09/16   Patrecia Pour, MD  multivitamin (RENA-VIT) TABS tablet Take 1 tablet by mouth daily.    Historical Provider, MD  omeprazole (PRILOSEC) 20 MG capsule Take 1 capsule (20 mg total) by mouth daily. 07/27/16   Ezequiel Essex, MD  ondansetron (ZOFRAN) 4 MG tablet Take 1 tablet (4 mg total) by mouth every 8 (eight) hours as needed for nausea or vomiting. 10/09/16   Patrecia Pour, MD  promethazine (PHENERGAN) 25 MG suppository Place 1 suppository (25 mg total) rectally every 6 (six) hours as needed for nausea or vomiting. 11/17/16   Margette Fast, MD  promethazine (PHENERGAN) 25 MG tablet Take 1 tablet (25 mg total) by mouth every 6 (six) hours as needed for nausea or vomiting. 11/17/16   Margette Fast, MD  sevelamer carbonate (RENVELA) 2.4 G PACK Take 2.4 g by mouth 3 (three) times daily with meals.     Historical Provider, MD  traMADol (ULTRAM) 50 MG tablet Take 1 tablet (50 mg total) by mouth  every 8 (eight) hours as needed. Patient taking differently: Take 50 mg by mouth every 6 (six) hours as needed (for pain).  06/15/16   Theodis Blaze, MD  VOLTAREN 1 % GEL Apply 4 g topically 4 (four) times daily. Patient taking differently: Apply 4 g topically as needed.  02/04/16   April Palumbo, MD    Family History Family History  Problem Relation Age of Onset  . Hypertension Mother   . Diabetes Father   . Kidney Stones Sister   . Diabetes Maternal Grandmother   . Liver cancer Maternal Uncle     Social History Social History  Substance Use Topics  . Smoking status: Former Smoker    Packs/day: 0.20    Years: 13.00    Types: Cigarettes    Quit date: 12/17/2014  . Smokeless tobacco: Never Used  . Alcohol use No     Allergies   Dilaudid [hydromorphone hcl]   Review of Systems Review of Systems  Constitutional: Negative for fever.  Respiratory: Negative  for shortness of breath.   Cardiovascular: Negative for chest pain.  Gastrointestinal: Positive for abdominal pain, constipation, nausea and vomiting.  All other systems reviewed and are negative.    Physical Exam Updated Vital Signs BP 126/85 (BP Location: Right Arm) Comment (BP Location): BP taken in pt's forearm  Pulse 97   Temp 97.8 F (36.6 C) (Oral)   Resp (!) 26   Ht 5\' 6"  (1.676 m)   Wt (!) 307 lb (139.3 kg)   SpO2 98%   BMI 49.55 kg/m   Physical Exam CONSTITUTIONAL: chronically ill appearing HEAD: Normocephalic/atraumatic EYES: EOMI/PERRL, no icterus ENMT: Mucous membranes moist NECK: supple no meningeal signs SPINE/BACK:entire spine nontender CV: S1/S2 noted, no murmurs/rubs/gallops noted LUNGS: Lungs are clear to auscultation bilaterally, no apparent distress ABDOMEN: soft, moderate diffuse tenderness, no rebound or guarding, bowel sounds noted throughout abdomen GU:no cva tenderness NEURO: Pt is awake/alert/appropriate, moves all extremitiesx4.  No facial droop.   EXTREMITIES: pulses normal/equal, full ROM, dialysis access to L arm SKIN: warm, color normal PSYCH: no abnormalities of mood noted, alert and oriented to situation   ED Treatments / Results  DIAGNOSTIC STUDIES: Oxygen Saturation is 98% on RA, NL by my interpretation.    COORDINATION OF CARE: 1:12 AM-Discussed next steps with pt. Pt verbalized understanding and is agreeable with the plan. Will order medications and labs.   Labs (all labs ordered are listed, but only abnormal results are displayed) Labs Reviewed  COMPREHENSIVE METABOLIC PANEL - Abnormal; Notable for the following:       Result Value   Sodium 131 (*)    Chloride 91 (*)    CO2 20 (*)    Glucose, Bld 106 (*)    BUN 21 (*)    Creatinine, Ser 8.53 (*)    Calcium 8.8 (*)    Total Protein 9.1 (*)    Total Bilirubin 1.4 (*)    GFR calc non Af Amer 7 (*)    GFR calc Af Amer 9 (*)    Anion gap 20 (*)    All other components  within normal limits  CBC - Abnormal; Notable for the following:    WBC 10.9 (*)    All other components within normal limits  LIPASE, BLOOD    EKG  EKG Interpretation  Date/Time:  Thursday November 19 2016 01:44:08 EDT Ventricular Rate:  88 PR Interval:    QRS Duration: 106 QT Interval:  360 QTC Calculation: 436 R Axis:  60 Text Interpretation:  Sinus rhythm RSR' in V1 or V2, right VCD or RVH No significant change since last tracing Confirmed by Christy Gentles  MD, Candela Krul ([id number]) on 11/19/2016 1:53:30 AM       Radiology No results found.  Procedures Procedures    Medications Ordered in ED Medications  metoCLOPramide (REGLAN) injection 10 mg (10 mg Intravenous Given 11/19/16 0136)  diphenhydrAMINE (BENADRYL) injection 25 mg (25 mg Intravenous Given 11/19/16 0133)  fentaNYL (SUBLIMAZE) injection 100 mcg (100 mcg Intravenous Given 11/19/16 0246)  ondansetron (ZOFRAN) injection 4 mg (4 mg Intravenous Given 11/19/16 0245)  ondansetron (ZOFRAN) injection 4 mg (4 mg Intravenous Given 11/19/16 0442)  fentaNYL (SUBLIMAZE) injection 50 mcg (50 mcg Intravenous Given 11/19/16 0443)     Initial Impression / Assessment and Plan / ED Course  I have reviewed the triage vital signs and the nursing notes.  Pertinent labs  results that were available during my care of the patient were reviewed by me and considered in my medical decision making (see chart for details).    5:01 AM Patient here with recurrent abd pain/vomiting He is reported to have gastroparesis but previous gastric emptying negative. Recent RUQ US revealed cholelithiasis After meds, pt improved but still focally tender in RUQ I suspect attacks of biliary colic are driving force behind these episodes However, pt is medically complex  After multiple rounds of meds, pt still with RUQ tenderness Will admit to medicine for surgical consult D/w dr Daryll Drown for admission to medicine   Final Clinical Impressions(s) / ED Diagnoses    Final diagnoses:  Intractable cyclical vomiting with nausea  Calculus of bile duct without cholecystitis and without obstruction    New Prescriptions New Prescriptions   No medications on file  I personally performed the services described in this documentation, which was scribed in my presence. The recorded information has been reviewed and is accurate.        Alex Fraise, MD 11/19/16 [phone]

## 2016-11-19 NOTE — H&P (Signed)
History and Physical    DAVINCI GLOTFELTY JSE:[id number] DOB: 1986-04-25 DOA: 11/19/2016   PCP: Philis Fendt, MD   Patient coming from/Resides with: Private residence/lives with sister  Admission status: Observation/floor -it may be medically necessary to stay a minimum 2 midnights to rule out impending and/or unexpected changes in physiologic status that may differ from initial evaluation performed in the ER and/or at time of admission therefore consider reevaluation and admission status in 24 hours.   Chief Complaint: Right upper quadrant abdominal pain with nausea and vomiting  HPI: [name] is a 31 y.o. male with medical history significant for overt obesity status post lap band procedure in 2016, reported gastroparesis in setting of normal gastric emptying study in 2016, known cholelithiasis, hypertension, chronic kidney disease on dialysis, GERD, and hepatic steatosis. Patient was recently discharged on 3/9 after an admission for intractable nausea and vomiting. It was surmised that the etiology to his symptoms are related to gastroparesis. This patient has had repeated hospitalizations for intractable nausea and vomiting and associated abdominal pain. The only abnormal finding during this timeframe was cholelithiasis without evidence of cholecystitis. HIDA scan of them for 2017 demonstrated ejection fraction of 39% which has remained stable since 2007. In 2018 he has been to the ER or been admitted for the same GI symptoms 7 times including today's presentation. In January 2018 he underwent EGD that revealed retained fluid in the gastric body, patchy mild inflammation with erosions and granularity in the gastric antrum and prepyloric region of the stomach, there was moderate inflammation with erythema nodularity in the duodenal bulb. Pathology revealed frontal gland hyperplasia and mild peptic change of the duodenum without evidence of malignancy and stomach biopsy revealed mild chronic  gastritis with no findings consistent with Helicobacter pylori also no evidence of malignancy. He returns to the ER once again with ongoing abdominal pain since this past Monday night primarily located in the right upper quadrant. He reports he also has a burning sensation in the epigastrium and the pain moves from the right upper quadrant down towards the lower part of his abdomen. He has had repeated episodes of nausea and vomiting. No change in bowel movement. He has mild leukocytosis, lipase was normal and total bilirubin is slightly elevated at 1.4. Abdominal ultrasound has been ordered but has not yet been completed.  ED Course:  Vital Signs: BP 113/71   Pulse 92   Temp 97.8 F (36.6 C) (Oral)   Resp (!) 22   Ht 5\' 6"  (1.676 m)   Wt (!) 139.3 kg (307 lb)   SpO2 100%   BMI 49.55 kg/m  RUQ abd [place]: Ordered but not yet completed Lab data: Sodium 131, potassium 3.7, chloride 91, CO2 20, glucose 106, BUN 21, creatinine 8.53, calcium 8.8, anion gap 20, albumin 4.5, AST and ALT are normal, alkaline phosphatase normal, lipase 25, total bilirubin 1.4, white count 10,900 differential not obtained, hemoglobin 14.5 abdominal platelets 207,000 Medications and treatments: Reglan 10 mg IV 1, Benadryl 25 mg IV 1, fentanyl 100 g 1, Zofran 4 mg IV 2, fentanyl 50 g IV 1  Review of Systems:  In addition to the HPI above,  No Fever-chills, myalgias or other constitutional symptoms No Headache, changes with Vision or hearing, new weakness, tingling, numbness in any extremity, dizziness, dysarthria or word finding difficulty, gait disturbance or imbalance, tremors or seizure activity No problems swallowing food or Liquids, indigestion/reflux, choking or coughing while eating, abdominal pain with or after eating No  Chest pain, Cough or Shortness of Breath, palpitations, orthopnea or DOE No melena,hematochezia, dark tarry stools, constipation No dysuria, malodorous urine, hematuria or flank pain No new  skin rashes, lesions, masses or bruises, No new joint pains, aches, swelling or redness No recent unintentional weight gain or loss No polyuria, polydypsia or polyphagia   Past Medical History:  Diagnosis Date  . Arthritis    "all over" (06/11/2016)  . Chronic lower back pain   . Complication of anesthesia    A little while to wake up after knee surgery in 2008  . Dizziness    when coming off of dialysis  . ESRD (end stage renal disease) on dialysis [facility])    MWF and goes to [facility] (06/11/2016)  . Family history of anesthesia complication    mom slow to wake up  . GERD (gastroesophageal reflux disease)    takes Omeprazole as needed  . Headache(784.0)    occasionally  . History of hiatal hernia   . Hyperparathyroidism ([place])   . Hypertension   . Joint pain   . Joint swelling   . Morbid obesity ([place])   . PONV (postoperative nausea and vomiting)   . Renal insufficiency     Past Surgical History:  Procedure Laterality Date  . AV FISTULA PLACEMENT Bilateral 2005,2007   Uses Right arm  . BREATH TEK H PYLORI N/A 05/15/2014   Procedure: BREATH TEK H PYLORI;  Surgeon: Alphonsa Overall, MD;  Location: [facility];  Service: General;  Laterality: N/A;  . ESOPHAGOGASTRODUODENOSCOPY N/A 12/07/2014   Procedure: ESOPHAGOGASTRODUODENOSCOPY (EGD);  Surgeon: Alphonsa Overall, MD;  Location[facility] END[facility];  Service: General;  Laterality: N/A;  . ESOPHAGOGASTRODUODENOSCOPY N/A 12/17/2014   Procedure: ESOPHAGOGASTRODUODENOSCOPY (EGD);  Surgeon: Alphonsa Overall, MD;  Location: [place];  Service: General;  Laterality: N/A;  . ESOPHAGOGASTRODUODENOSCOPY N/A 08/16/2016   Procedure: ESOPHAGOGASTRODUODENOSCOPY (EGD);  Surgeon: Jerene Bears, MD;  Location: [place];  Service: Gastroenterology;  Laterality: N/A;  . HERNIA REPAIR  11/2014   w/lap band OR  . JOINT REPLACEMENT    . KNEE ARTHROSCOPY Left 2007  . LAPAROSCOPIC GASTRIC BANDING N/A 11/12/2014   Procedure: LAPAROSCOPIC GASTRIC BANDING;  Surgeon:  Alphonsa Overall, MD;  Location: [facility];  Service: General;  Laterality: N/A;  . PARATHYROIDECTOMY N/A 03/30/2013   Procedure: TOTAL PARATHYROIDECTOMY WITH AUTOTRANSPLANT;  Surgeon: Earnstine Regal, MD;  Location: [place];  Service: General;  Laterality: N/A;  Autotransplant to left lower arm.  Marland Kitchen TOTAL KNEE ARTHROPLASTY Right 03/27/2014   Procedure: UNICOMPARTMENTAL ARTHROPLASTY;  Surgeon: Meredith Pel, MD;  Location: [place];  Service: Orthopedics;  Laterality: Right;    Social History   Social History  . Marital status: Single    Spouse name: N/A  . Number of children: 0  . Years of education: N/A   Occupational History  . Not on file.   Social History Main Topics  . Smoking status: Former Smoker    Packs/day: 0.20    Years: 13.00    Types: Cigarettes    Quit date: 12/17/2014  . Smokeless tobacco: Never Used  . Alcohol use No  . Drug use: No  . Sexual activity: No   Other Topics Concern  . Not on file   Social History Narrative  . No narrative on file    Mobility: Independent Work history: Disabled   Allergies  Allergen Reactions  . Dilaudid [Hydromorphone Hcl] Other (See Comments)    Patient became hypoxic to 60% with dilaudid 1mg  IV  Family History  Problem Relation Age of Onset  . Hypertension Mother   . Diabetes Father   . Kidney Stones Sister   . Diabetes Maternal Grandmother   . Liver cancer Maternal Uncle      Prior to Admission medications   Medication Sig Start Date End Date Taking? Authorizing Provider  albuterol (PROVENTIL HFA;VENTOLIN HFA) 108 (90 Base) MCG/ACT inhaler Inhale 2 puffs into the lungs every 4 (four) hours as needed for wheezing or shortness of breath. 05/14/16  Yes Lysbeth Penner, [name]  calcium carbonate (TUMS - DOSED IN MG ELEMENTAL CALCIUM) 500 MG chewable tablet Chew 2 tablets by mouth 2 (two) times daily as needed for indigestion or heartburn.    Yes Historical Provider, MD  glycopyrrolate (ROBINUL) 2 MG tablet Take 1 tablet (2  mg total) by mouth 2 (two) times daily. 01/25/15  Yes Inda Castle, MD  Hyoscyamine Sulfate 0.375 MG TBCR Take 1 tablet twice daily Patient taking differently: Take 0.375 mg by mouth 2 (two) times daily.  01/22/15  Yes Inda Castle, MD  lidocaine (XYLOCAINE) 2 % jelly Apply 1 application topically as needed. Patient taking differently: Apply 1 application topically daily as needed (pain).  02/19/16  Yes Benjamin Cartner, [name]  methocarbamol (ROBAXIN) 500 MG tablet Take 1 tablet (500 mg total) by mouth every 8 (eight) hours as needed for muscle spasms. 06/15/16  Yes Theodis Blaze, MD  metoCLOPramide (REGLAN) 5 MG tablet Take 1 tablet (5 mg total) by mouth 2 (two) times daily before a meal. 10/09/16  Yes Patrecia Pour, MD  multivitamin (RENA-VIT) TABS tablet Take 1 tablet by mouth daily.   Yes Historical Provider, MD  omeprazole (PRILOSEC) 20 MG capsule Take 1 capsule (20 mg total) by mouth daily. 07/27/16  Yes Ezequiel Essex, MD  ondansetron (ZOFRAN) 4 MG tablet Take 1 tablet (4 mg total) by mouth every 8 (eight) hours as needed for nausea or vomiting. 10/09/16  Yes Patrecia Pour, MD  promethazine (PHENERGAN) 25 MG suppository Place 1 suppository (25 mg total) rectally every 6 (six) hours as needed for nausea or vomiting. 11/17/16  Yes Margette Fast, MD  promethazine (PHENERGAN) 25 MG tablet Take 1 tablet (25 mg total) by mouth every 6 (six) hours as needed for nausea or vomiting. 11/17/16  Yes Margette Fast, MD  sevelamer carbonate (RENVELA) 2.4 G PACK Take 2.4 g by mouth 3 (three) times daily with meals.    Yes Historical Provider, MD  traMADol (ULTRAM) 50 MG tablet Take 1 tablet (50 mg total) by mouth every 8 (eight) hours as needed. Patient taking differently: Take 50 mg by mouth every 6 (six) hours as needed (for pain).  06/15/16  Yes Theodis Blaze, MD  VOLTAREN 1 % GEL Apply 4 g topically 4 (four) times daily. Patient taking differently: Apply 4 g topically as needed.  02/04/16  Yes April Palumbo, MD      Physical Exam: Vitals:   11/19/16 0600 11/19/16 0630 11/19/16 0700 11/19/16 0744  BP: 115/69 111/67 113/71 123/63  Pulse: 85 93 92 91  Resp: 12 15 (!) 22 20  Temp:      TempSrc:      SpO2: 100% 100% 100% 100%  Weight:      Height:          Constitutional: NAD, calm, uncomfortable Eyes: PERRL, lids and conjunctivae normal ENMT: Mucous membranes are dry. Posterior pharynx clear of any exudate or lesions.Normal dentition.  Neck: normal, supple,  no masses, no thyromegaly Respiratory: clear to auscultation bilaterally, no wheezing, no crackles. Normal respiratory effort. No accessory muscle use.  Cardiovascular: Regular rate and rhythm, no murmurs / rubs / gallops. No extremity edema. 2+ pedal pulses. No carotid bruits.  Abdomen: Focal tenderness RUQ with minimal guarding but no rebounding, no masses palpated. No hepatosplenomegaly. Bowel sounds positive but are hypoactive.  Musculoskeletal: no clubbing / cyanosis. No joint deformity upper and lower extremities. Good ROM, no contractures. Normal muscle tone.  Skin: no rashes, lesions, ulcers. No induration Neurologic: CN 2-12 grossly intact. Sensation intact, DTR normal. Strength 5/5 x all 4 extremities.  Psychiatric: Normal judgment and insight. Alert and oriented x 3. Normal mood.    Labs on Admission: I have personally reviewed following labs and imaging studies  CBC:  Recent Labs Lab 11/17/16 1149 11/18/16 2211  WBC 8.9 10.9*  HGB 14.1 14.5  HCT 41.0 42.3  MCV 89.5 90.0  PLT 213 010   Basic Metabolic Panel:  Recent Labs Lab 11/17/16 1149 11/18/16 2211  NA 139 131*  K 3.9 3.7  CL 92* 91*  CO2 25 20*  GLUCOSE 121* 106*  BUN 55* 21*  CREATININE 13.93* 8.53*  CALCIUM 8.2* 8.8*   GFR: Estimated Creatinine Clearance: 16.7 mL/min (A) (by C-G formula based on SCr of 8.53 mg/dL (H)). Liver Function Tests:  Recent Labs Lab 11/17/16 1149 11/18/16 2211  AST 17 17  ALT 19 19  ALKPHOS 64 70  BILITOT 0.9 1.4*   PROT 8.7* 9.1*  ALBUMIN 4.5 4.5    Recent Labs Lab 11/17/16 1149 11/18/16 2211  LIPASE 24 25   No results for input(s): AMMONIA in the last 168 hours. Coagulation Profile: No results for input(s): INR, PROTIME in the last 168 hours. Cardiac Enzymes: No results for input(s): CKTOTAL, CKMB, CKMBINDEX, TROPONINI in the last 168 hours. BNP (last 3 results) No results for input(s): PROBNP in the last 8760 hours. HbA1C: No results for input(s): HGBA1C in the last 72 hours. CBG: No results for input(s): GLUCAP in the last 168 hours. Lipid Profile: No results for input(s): CHOL, HDL, LDLCALC, TRIG, CHOLHDL, LDLDIRECT in the last 72 hours. Thyroid Function Tests: No results for input(s): TSH, T4TOTAL, FREET4, T3FREE, THYROIDAB in the last 72 hours. Anemia Panel: No results for input(s): VITAMINB12, FOLATE, FERRITIN, TIBC, IRON, RETICCTPCT in the last 72 hours. Urine analysis:    Component Value Date/Time   LABSPEC 1.010 01/08/2010 1817   PHURINE >=9.0 01/08/2010 1817   GLUCOSEU 100 (A) 01/08/2010 1817   HGBUR LARGE (A) 01/08/2010 1817   BILIRUBINUR LARGE (A) 01/08/2010 1817   KETONESUR 15 (A) 01/08/2010 1817   PROTEINUR >=300 (A) 01/08/2010 1817   UROBILINOGEN >=8.0 01/08/2010 1817   NITRITE POSITIVE (A) 01/08/2010 1817   LEUKOCYTESUR (A) 01/08/2010 1817    LARGE Biochemical Testing Only. Please order routine urinalysis from main lab if confirmatory testing is needed.   Sepsis Labs: @LABRCNTIP (procalcitonin:4,lacticidven:4) )No results found for this or any previous visit (from the past 240 hour(s)).   Radiological Exams on Admission: No results found.  EKG: (Independently reviewed) Sinus rhythm with ventricular rate 88 bpm, QTC 436 ms, normal R-wave rotation, no ischemic changes  Assessment/Plan Principal Problem:   RUQ pain/ Cholelithiasis -Chronically recurring symptoms that have worsened since this past Monday -LFTs normal except for mild elevation in total  bilirubin -Repeat RUQ abd [place] pending -NPO-gentle IVFs @ 30/hr since HD pt -Had hepatobiliary scan November 2017 with normal ejection fraction and no ductal obstruction -  Repeat CMET/CBC now -Low-dose IV Toradol for moderate pain -Low-dose IV fentanyl or severe pain -Zofran/Phenergan for nausea and vomiting  Active Problems:   ?? Gastroparesis- NORMAL NM gastric emptying study 2016 -Diagnosed based on symptoms and has been given medications for symptom management such as Reglan and Zofran and during previous admissions -Normal gastric emptying study 2016 -EGD this year did reveal mild retained fluid and stomach despite reportedly NPO arch procedure    Gastritis/GERD (gastroesophageal reflux disease) -Previous EGD revealed mild gastritis -Was not on PPI, H2 blocker or Carafate prior to admission -Biopsies from January 2018 EGD negative for H. Pylori -Obtain H. pylori serologies -Protonix 40 mg IV q 12 hrs    CKD (chronic kidney disease) stage V requiring chronic dialysis  -MWF schedule completed full session yesterday 4/18     Essential hypertension -Not on medical therapy prior to admission    Increased anion gap metabolic acidosis -Completed dialysis yesterday per patient report so suspect this is related to recurrent nausea and vomiting and underlying dehydration -Gentle IV fluid hydration -Follow labs    Morbid obesity, weight 291, BMI - 47/Lapband April 2016 -Weight has varied from 285 lbs June 2017 up to 320 lbs December 2017 with current reported weight around 307 lbs -EDW dialysis unknown -Current symptoms may be reflective of problems related to LAP-BAND -Abdominal films last month demonstrated scattered abdominal air-fluid levels without obstructive change-if ultrasound unrevealing consider plain abdominal films versus CT to further explore -CT abdomen and pelvis January 2018 did not explain recurrent symptoms as documented above    Hepatic steatosis -AST and ALT are  normal with acute mildly elevated TB presentation      DVT prophylaxis: SQ heparin  Code Status: Full Family Communication: No family at bedside Disposition Plan: Home Consults called: Nephrology/ [place]    Samella Parr ANP-BC [facility] Pager [phone]   If 7PM-7AM, please contact night-coverage [email or web address] Password [facility]  11/19/2016, 7:53 AM

## 2016-11-20 DIAGNOSIS — R109 Unspecified abdominal pain: Secondary | ICD-10-CM | POA: Diagnosis not present

## 2016-11-20 DIAGNOSIS — K838 Other specified diseases of biliary tract: Secondary | ICD-10-CM | POA: Diagnosis present

## 2016-11-20 DIAGNOSIS — I10 Essential (primary) hypertension: Secondary | ICD-10-CM | POA: Diagnosis not present

## 2016-11-20 DIAGNOSIS — M545 Low back pain: Secondary | ICD-10-CM | POA: Diagnosis present

## 2016-11-20 DIAGNOSIS — K2980 Duodenitis without bleeding: Secondary | ICD-10-CM | POA: Diagnosis present

## 2016-11-20 DIAGNOSIS — K3184 Gastroparesis: Secondary | ICD-10-CM | POA: Diagnosis present

## 2016-11-20 DIAGNOSIS — E785 Hyperlipidemia, unspecified: Secondary | ICD-10-CM | POA: Diagnosis present

## 2016-11-20 DIAGNOSIS — I120 Hypertensive chronic kidney disease with stage 5 chronic kidney disease or end stage renal disease: Secondary | ICD-10-CM | POA: Diagnosis present

## 2016-11-20 DIAGNOSIS — Z992 Dependence on renal dialysis: Secondary | ICD-10-CM | POA: Diagnosis not present

## 2016-11-20 DIAGNOSIS — K5900 Constipation, unspecified: Secondary | ICD-10-CM | POA: Diagnosis not present

## 2016-11-20 DIAGNOSIS — Z96653 Presence of artificial knee joint, bilateral: Secondary | ICD-10-CM | POA: Diagnosis present

## 2016-11-20 DIAGNOSIS — R1011 Right upper quadrant pain: Secondary | ICD-10-CM | POA: Diagnosis present

## 2016-11-20 DIAGNOSIS — K760 Fatty (change of) liver, not elsewhere classified: Secondary | ICD-10-CM | POA: Diagnosis present

## 2016-11-20 DIAGNOSIS — Z79899 Other long term (current) drug therapy: Secondary | ICD-10-CM | POA: Diagnosis not present

## 2016-11-20 DIAGNOSIS — K8065 Calculus of gallbladder and bile duct with chronic cholecystitis with obstruction: Secondary | ICD-10-CM | POA: Diagnosis present

## 2016-11-20 DIAGNOSIS — D631 Anemia in chronic kidney disease: Secondary | ICD-10-CM | POA: Diagnosis present

## 2016-11-20 DIAGNOSIS — K2970 Gastritis, unspecified, without bleeding: Secondary | ICD-10-CM | POA: Diagnosis present

## 2016-11-20 DIAGNOSIS — F17210 Nicotine dependence, cigarettes, uncomplicated: Secondary | ICD-10-CM | POA: Diagnosis present

## 2016-11-20 DIAGNOSIS — G8929 Other chronic pain: Secondary | ICD-10-CM | POA: Diagnosis present

## 2016-11-20 DIAGNOSIS — Z6841 Body Mass Index (BMI) 40.0 and over, adult: Secondary | ICD-10-CM | POA: Diagnosis not present

## 2016-11-20 DIAGNOSIS — N186 End stage renal disease: Secondary | ICD-10-CM | POA: Diagnosis present

## 2016-11-20 DIAGNOSIS — M1990 Unspecified osteoarthritis, unspecified site: Secondary | ICD-10-CM | POA: Diagnosis present

## 2016-11-20 DIAGNOSIS — K219 Gastro-esophageal reflux disease without esophagitis: Secondary | ICD-10-CM | POA: Diagnosis present

## 2016-11-20 DIAGNOSIS — E872 Acidosis: Secondary | ICD-10-CM | POA: Diagnosis present

## 2016-11-20 DIAGNOSIS — N2581 Secondary hyperparathyroidism of renal origin: Secondary | ICD-10-CM | POA: Diagnosis present

## 2016-11-20 DIAGNOSIS — K8020 Calculus of gallbladder without cholecystitis without obstruction: Secondary | ICD-10-CM | POA: Diagnosis not present

## 2016-11-20 LAB — CBC
HEMATOCRIT: 39.2 % (ref 39.0–52.0)
Hemoglobin: 13.4 g/dL (ref 13.0–17.0)
MCH: 30.8 pg (ref 26.0–34.0)
MCHC: 34.2 g/dL (ref 30.0–36.0)
MCV: 90.1 fL (ref 78.0–100.0)
Platelets: 199 10*3/uL (ref 150–400)
RBC: 4.35 MIL/uL (ref 4.22–5.81)
RDW: 13.7 % (ref 11.5–15.5)
WBC: 11.8 10*3/uL — AB (ref 4.0–10.5)

## 2016-11-20 LAB — COMPREHENSIVE METABOLIC PANEL
ALT: 17 U/L (ref 17–63)
ANION GAP: 23 — AB (ref 5–15)
AST: 15 U/L (ref 15–41)
Albumin: 4.1 g/dL (ref 3.5–5.0)
Alkaline Phosphatase: 58 U/L (ref 38–126)
BILIRUBIN TOTAL: 1.1 mg/dL (ref 0.3–1.2)
BUN: 47 mg/dL — AB (ref 6–20)
CO2: 22 mmol/L (ref 22–32)
Calcium: 8 mg/dL — ABNORMAL LOW (ref 8.9–10.3)
Chloride: 93 mmol/L — ABNORMAL LOW (ref 101–111)
Creatinine, Ser: 13.89 mg/dL — ABNORMAL HIGH (ref 0.61–1.24)
GFR calc Af Amer: 5 mL/min — ABNORMAL LOW (ref >60)
GFR, EST NON AFRICAN AMERICAN: 4 mL/min — AB (ref >60)
Glucose, Bld: 92 mg/dL (ref 65–99)
POTASSIUM: 4.3 mmol/L (ref 3.5–5.1)
Sodium: 138 mmol/L (ref 135–145)
Total Protein: 8.2 g/dL — ABNORMAL HIGH (ref 6.5–8.1)

## 2016-11-20 LAB — H PYLORI, IGM, IGG, IGA AB
H PYLORI IGG: 1.41 {index_val} — AB (ref 0.00–0.79)
H. Pylogi, Iga Abs: <9 units (ref 0.0–8.9)
H. Pylogi, Igm Abs: 11.6 units — ABNORMAL HIGH (ref 0.0–8.9)

## 2016-11-20 MED ORDER — ACETAMINOPHEN 325 MG PO TABS
ORAL_TABLET | ORAL | Status: AC
  Filled 2016-11-20: qty 2

## 2016-11-20 MED ORDER — DEXTROSE 5 % IV SOLN
500.0000 mg | Freq: Once | INTRAVENOUS | Status: AC
  Administered 2016-11-20: 500 mg via INTRAVENOUS
  Filled 2016-11-20: qty 5

## 2016-11-20 NOTE — Consult Note (Signed)
[facility] Consult Note  [name] Mar 29, 1986  [id number].    Requesting MD: Maryland Pink, MD Chief Complaint/Reason for Consult: cholelithiasis, chronic abdominal pain HPI:  [name] is a 31 y.o. Male with a PMH of chronic intractable abdominal pain and vomiting, cholelithiasis, lap band s/p removal 12/2014 Lucia Gaskins, MD), HTN, CKD on HD, and GERD who presented to the ED 11/19/16 with RUQ pain and nausea/vomiting.  Pain and emesis started 4 days ago after eating neck bones and rice. Described as intermittent and cramping and located mostly over his right upper and lower abdomen. He states that no one else who consumed the same meal got sick and denies sick contacts. Vomiting since that time is not associated with meals.  The patient has presented to ED multiple times and required previous hospitalizations for the same complaints, dating back to the end of last year. He was given reglan a couple of months ago in the ED which has provided some relief. Overall he feels that the nausea/vomiting is more bothersome than his pain. He endorses constipation, with his last BM being Monday. At baseline he has formed, brown stools daily. Patient currently receives healthcare at a clinic but does not see GI doc on a regular basis. He is currently unemployed after the unfortunate loss of his mother in December 2017.   ED workup: WBC 11.8, mildly elevated bilirubin 1.3, LFT's WNL, Creatinine 9.92; MRCP negative for CBD stone or cholecystitis.   ROS: Review of Systems  Constitutional: Negative for chills, fever and weight loss.  Gastrointestinal: Positive for abdominal pain, constipation, heartburn, nausea and vomiting.  All other systems reviewed and are negative.   Family History  Problem Relation Age of Onset  . Hypertension Mother   . Diabetes Father   . Kidney Stones Sister   . Diabetes Maternal Grandmother   . Liver cancer Maternal Uncle     Past Medical History:  Diagnosis Date  .  Arthritis    "all over" (11/19/2016)  . Chronic lower back pain   . Complication of anesthesia    A little while to wake up after knee surgery in 2008  . Dizziness    when coming off of dialysis  . ESRD (end stage renal disease) on dialysis [facility])    MWF and goes to [facility] (11/19/2016)  . Family history of anesthesia complication    mom slow to wake up  . GERD (gastroesophageal reflux disease)    takes Omeprazole as needed  . FKCLEXNT(700.1)    "monthly" (11/19/2016)  . History of hiatal hernia   . Hyperparathyroidism ([place])   . Hypertension   . Joint pain   . Joint swelling   . Morbid obesity ([place])   . PONV (postoperative nausea and vomiting)   . Renal insufficiency     Past Surgical History:  Procedure Laterality Date  . AV FISTULA PLACEMENT Bilateral 2005,2007   "right; left"  . AV FISTULA REPAIR Right 2007   "tried to clean it out; couldn't do it"  . BREATH TEK H PYLORI N/A 05/15/2014   Procedure: BREATH TEK H PYLORI;  Surgeon: Alphonsa Overall, MD;  Location: [facility];  Service: General;  Laterality: N/A;  . ESOPHAGOGASTRODUODENOSCOPY N/A 12/07/2014   Procedure: ESOPHAGOGASTRODUODENOSCOPY (EGD);  Surgeon: Alphonsa Overall, MD;  Location: [facility];  Service: General;  Laterality: N/A;  . ESOPHAGOGASTRODUODENOSCOPY N/A 12/17/2014   Procedure: ESOPHAGOGASTRODUODENOSCOPY (EGD);  Surgeon: Alphonsa Overall, MD;  Location: [place];  Service: General;  Laterality: N/A;  . ESOPHAGOGASTRODUODENOSCOPY  N/A 08/16/2016   Procedure: ESOPHAGOGASTRODUODENOSCOPY (EGD);  Surgeon: Jerene Bears, MD;  Location: [place];  Service: Gastroenterology;  Laterality: N/A;  . HERNIA REPAIR  11/2014   w/lap band OR  . JOINT REPLACEMENT    . KNEE ARTHROSCOPY Left 2007  . LAPAROSCOPIC GASTRIC BANDING N/A 11/12/2014   Procedure: LAPAROSCOPIC GASTRIC BANDING;  Surgeon: Alphonsa Overall, MD;  Location: [facility];  Service: General;  Laterality: N/A;  . PARATHYROIDECTOMY N/A 03/30/2013   Procedure: TOTAL  PARATHYROIDECTOMY WITH AUTOTRANSPLANT;  Surgeon: Earnstine Regal, MD;  Location: [place];  Service: General;  Laterality: N/A;  Autotransplant to left lower arm.  Marland Kitchen TOTAL KNEE ARTHROPLASTY Right 03/27/2014   Procedure: UNICOMPARTMENTAL ARTHROPLASTY;  Surgeon: Meredith Pel, MD;  Location: [place];  Service: Orthopedics;  Laterality: Right;    Social History:  reports that he has been smoking Cigarettes.  He has a 1.60 pack-year smoking history. He has never used smokeless tobacco. He reports that he does not drink alcohol or use drugs.  Allergies:  Allergies  Allergen Reactions  . Dilaudid [Hydromorphone Hcl] Other (See Comments)    Patient became hypoxic to 60% with dilaudid 5m IV    Medications Prior to Admission  Medication Sig Dispense Refill  . albuterol (PROVENTIL HFA;VENTOLIN HFA) 108 (90 Base) MCG/ACT inhaler Inhale 2 puffs into the lungs every 4 (four) hours as needed for wheezing or shortness of breath. 1 Inhaler 0  . calcium carbonate (TUMS - DOSED IN MG ELEMENTAL CALCIUM) 500 MG chewable tablet Chew 2 tablets by mouth 2 (two) times daily as needed for indigestion or heartburn.     .Marland Kitchenglycopyrrolate (ROBINUL) 2 MG tablet Take 1 tablet (2 mg total) by mouth 2 (two) times daily. 60 tablet 3  . Hyoscyamine Sulfate 0.375 MG TBCR Take 1 tablet twice daily (Patient taking differently: Take 0.375 mg by mouth 2 (two) times daily. ) 60 tablet 1  . lidocaine (XYLOCAINE) 2 % jelly Apply 1 application topically as needed. (Patient taking differently: Apply 1 application topically daily as needed (pain). ) 30 mL 1  . methocarbamol (ROBAXIN) 500 MG tablet Take 1 tablet (500 mg total) by mouth every 8 (eight) hours as needed for muscle spasms. 20 tablet 0  . metoCLOPramide (REGLAN) 5 MG tablet Take 1 tablet (5 mg total) by mouth 2 (two) times daily before a meal. 60 tablet 0  . multivitamin (RENA-VIT) TABS tablet Take 1 tablet by mouth daily.    .Marland Kitchenomeprazole (PRILOSEC) 20 MG capsule Take 1 capsule  (20 mg total) by mouth daily. 30 capsule 0  . ondansetron (ZOFRAN) 4 MG tablet Take 1 tablet (4 mg total) by mouth every 8 (eight) hours as needed for nausea or vomiting. 20 tablet 0  . promethazine (PHENERGAN) 25 MG suppository Place 1 suppository (25 mg total) rectally every 6 (six) hours as needed for nausea or vomiting. 12 each 0  . promethazine (PHENERGAN) 25 MG tablet Take 1 tablet (25 mg total) by mouth every 6 (six) hours as needed for nausea or vomiting. 30 tablet 0  . sevelamer carbonate (RENVELA) 2.4 G PACK Take 2.4 g by mouth 3 (three) times daily with meals.     . traMADol (ULTRAM) 50 MG tablet Take 1 tablet (50 mg total) by mouth every 8 (eight) hours as needed. (Patient taking differently: Take 50 mg by mouth every 6 (six) hours as needed (for pain). ) 30 tablet 0  . VOLTAREN 1 % GEL Apply 4 g topically 4 (  four) times daily. (Patient taking differently: Apply 4 g topically as needed. ) 100 g 0    Blood pressure (!) (P) 101/35, pulse (P) 90, temperature 97.6 F (36.4 C), temperature source Oral, resp. rate 18, height '5\' 6"'  (1.676 m), weight (!) 139.6 kg (307 lb 12.2 oz), SpO2 97 %. Physical Exam: Physical Exam  Constitutional: He is oriented to person, place, and time. He appears well-developed and well-nourished. No distress.  HENT:  Head: Normocephalic and atraumatic.  Right Ear: External ear normal.  Left Ear: External ear normal.  Mouth/Throat: Oropharynx is clear and moist.  Eyes: EOM are normal. No scleral icterus.  Neck: Normal range of motion. No tracheal deviation present. No thyromegaly present.  Cardiovascular: Normal rate, regular rhythm, normal heart sounds and intact distal pulses.   Pulmonary/Chest: Effort normal and breath sounds normal. No respiratory distress. He has no wheezes. He has no rales.  Abdominal: Soft. He exhibits no distension and no mass. There is no tenderness. There is no rebound and no guarding. No hernia.  Obese, Hypoactive bowel sounds   Musculoskeletal: Normal range of motion. He exhibits no edema or deformity.  Neurological: He is alert and oriented to person, place, and time. No sensory deficit.  Skin: Skin is warm and dry. No rash noted. He is not diaphoretic. No erythema.    Results for orders placed or performed during the hospital encounter of 11/19/16 (from the past 48 hour(s))  Lipase, blood     Status: None   Collection Time: 11/18/16 10:11 PM  Result Value Ref Range   Lipase 25 11 - 51 U/L  Comprehensive metabolic panel     Status: Abnormal   Collection Time: 11/18/16 10:11 PM  Result Value Ref Range   Sodium 131 (L) 135 - 145 mmol/L    Comment: DELTA CHECK NOTED   Potassium 3.7 3.5 - 5.1 mmol/L   Chloride 91 (L) 101 - 111 mmol/L   CO2 20 (L) 22 - 32 mmol/L   Glucose, Bld 106 (H) 65 - 99 mg/dL   BUN 21 (H) 6 - 20 mg/dL   Creatinine, Ser 8.53 (H) 0.61 - 1.24 mg/dL    Comment: DELTA CHECK NOTED   Calcium 8.8 (L) 8.9 - 10.3 mg/dL   Total Protein 9.1 (H) 6.5 - 8.1 g/dL   Albumin 4.5 3.5 - 5.0 g/dL   AST 17 15 - 41 U/L   ALT 19 17 - 63 U/L   Alkaline Phosphatase 70 38 - 126 U/L   Total Bilirubin 1.4 (H) 0.3 - 1.2 mg/dL   GFR calc non Af Amer 7 (L) >60 mL/min   GFR calc Af Amer 9 (L) >60 mL/min    Comment: (NOTE) The eGFR has been calculated using the CKD EPI equation. This calculation has not been validated in all clinical situations. eGFR's persistently <60 mL/min signify possible Chronic Kidney Disease.    Anion gap 20 (H) 5 - 15  CBC     Status: Abnormal   Collection Time: 11/18/16 10:11 PM  Result Value Ref Range   WBC 10.9 (H) 4.0 - 10.5 K/uL   RBC 4.70 4.22 - 5.81 MIL/uL   Hemoglobin 14.5 13.0 - 17.0 g/dL   HCT 42.3 39.0 - 52.0 %   MCV 90.0 78.0 - 100.0 fL   MCH 30.9 26.0 - 34.0 pg   MCHC 34.3 30.0 - 36.0 g/dL   RDW 14.1 11.5 - 15.5 %   Platelets 207 150 - 400 K/uL  Comprehensive metabolic panel  Status: Abnormal   Collection Time: 11/19/16  7:42 AM  Result Value Ref Range    Sodium 135 135 - 145 mmol/L   Potassium 4.2 3.5 - 5.1 mmol/L   Chloride 88 (L) 101 - 111 mmol/L   CO2 25 22 - 32 mmol/L   Glucose, Bld 90 65 - 99 mg/dL   BUN 27 (H) 6 - 20 mg/dL   Creatinine, Ser 9.92 (H) 0.61 - 1.24 mg/dL   Calcium 8.9 8.9 - 10.3 mg/dL   Total Protein 9.4 (H) 6.5 - 8.1 g/dL   Albumin 4.6 3.5 - 5.0 g/dL   AST 15 15 - 41 U/L   ALT 19 17 - 63 U/L   Alkaline Phosphatase 73 38 - 126 U/L   Total Bilirubin 1.3 (H) 0.3 - 1.2 mg/dL   GFR calc non Af Amer 6 (L) >60 mL/min   GFR calc Af Amer 7 (L) >60 mL/min    Comment: (NOTE) The eGFR has been calculated using the CKD EPI equation. This calculation has not been validated in all clinical situations. eGFR's persistently <60 mL/min signify possible Chronic Kidney Disease.    Anion gap 22 (H) 5 - 15  CBC with Differential/Platelet     Status: Abnormal   Collection Time: 11/19/16  7:42 AM  Result Value Ref Range   WBC 11.8 (H) 4.0 - 10.5 K/uL   RBC 4.82 4.22 - 5.81 MIL/uL   Hemoglobin 15.1 13.0 - 17.0 g/dL   HCT 43.7 39.0 - 52.0 %   MCV 90.7 78.0 - 100.0 fL   MCH 31.3 26.0 - 34.0 pg   MCHC 34.6 30.0 - 36.0 g/dL   RDW 13.9 11.5 - 15.5 %   Platelets 205 150 - 400 K/uL   Neutrophils Relative % 65 %   Neutro Abs 7.7 1.7 - 7.7 K/uL   Lymphocytes Relative 20 %   Lymphs Abs 2.3 0.7 - 4.0 K/uL   Monocytes Relative 13 %   Monocytes Absolute 1.6 (H) 0.1 - 1.0 K/uL   Eosinophils Relative 2 %   Eosinophils Absolute 0.2 0.0 - 0.7 K/uL   Basophils Relative 0 %   Basophils Absolute 0.0 0.0 - 0.1 K/uL  MRSA PCR Screening     Status: None   Collection Time: 11/19/16  4:17 PM  Result Value Ref Range   MRSA by PCR NEGATIVE NEGATIVE    Comment:        The GeneXpert MRSA Assay (FDA approved for NASAL specimens only), is one component of a comprehensive MRSA colonization surveillance program. It is not intended to diagnose MRSA infection nor to guide or monitor treatment for MRSA infections.   Comprehensive metabolic panel      Status: Abnormal   Collection Time: 11/20/16  7:23 AM  Result Value Ref Range   Sodium 138 135 - 145 mmol/L   Potassium 4.3 3.5 - 5.1 mmol/L   Chloride 93 (L) 101 - 111 mmol/L   CO2 22 22 - 32 mmol/L   Glucose, Bld 92 65 - 99 mg/dL   BUN 47 (H) 6 - 20 mg/dL   Creatinine, Ser 13.89 (H) 0.61 - 1.24 mg/dL   Calcium 8.0 (L) 8.9 - 10.3 mg/dL   Total Protein 8.2 (H) 6.5 - 8.1 g/dL   Albumin 4.1 3.5 - 5.0 g/dL   AST 15 15 - 41 U/L   ALT 17 17 - 63 U/L   Alkaline Phosphatase 58 38 - 126 U/L   Total Bilirubin 1.1 0.3 - 1.2  mg/dL   GFR calc non Af Amer 4 (L) >60 mL/min   GFR calc Af Amer 5 (L) >60 mL/min    Comment: (NOTE) The eGFR has been calculated using the CKD EPI equation. This calculation has not been validated in all clinical situations. eGFR's persistently <60 mL/min signify possible Chronic Kidney Disease.    Anion gap 23 (H) 5 - 15  CBC     Status: Abnormal   Collection Time: 11/20/16  7:23 AM  Result Value Ref Range   WBC 11.8 (H) 4.0 - 10.5 K/uL   RBC 4.35 4.22 - 5.81 MIL/uL   Hemoglobin 13.4 13.0 - 17.0 g/dL   HCT 39.2 39.0 - 52.0 %   MCV 90.1 78.0 - 100.0 fL   MCH 30.8 26.0 - 34.0 pg   MCHC 34.2 30.0 - 36.0 g/dL   RDW 13.7 11.5 - 15.5 %   Platelets 199 150 - 400 K/uL   Mr Abdomen Mrcp Wo Contrast  Result Date: 11/19/2016 CLINICAL DATA:  Inpatient. End-stage renal disease on hemodialysis. Right upper quadrant abdominal pain. Cholelithiasis and dilated common bile duct on abdominal sonogram from earlier today. EXAM: MRI ABDOMEN WITHOUT CONTRAST  (INCLUDING MRCP) TECHNIQUE: Multiplanar multisequence MR imaging of the abdomen was performed. Heavily T2-weighted images of the biliary and pancreatic ducts were obtained, and three-dimensional MRCP images were rendered by post processing. COMPARISON:  11/19/2016 right upper quadrant abdominal sonogram. 08/13/2016 CT abdomen/pelvis. FINDINGS: Lower chest: Mild hypoventilatory changes in the dependent lung bases. Hepatobiliary:  Normal liver size and configuration. Prominent signal loss throughout the liver on the inphase sequence, compatible with diffuse hepatic iron deposition. No liver mass. Nondistended gallbladder contains multiple subcentimeter layering gallstones, with no gallbladder wall thickening or pericholecystic fluid. No intrahepatic biliary ductal dilatation. Common bile duct diameter 6 mm, upper normal, with smooth distal tapering. No biliary filling defects to suggest choledocholithiasis. No biliary strictures or masses. No ampullary mass. Pancreas: No pancreatic mass or duct dilation. 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 Spleen: Normal size spleen. Diffuse hepatic iron deposition in the spleen. No splenic mass. Adrenals/Urinary Tract: Normal adrenals. Symmetric severe bilateral renal parenchymal atrophy. Kidneys are largely replaced by innumerable renal cysts, most of which appear simple measuring up to the 4.0 cm in the upper left kidney, with a few scattered subcentimeter T1 hyperintense renal cysts in both kidneys probably representing hemorrhagic/proteinaceous renal cysts, although incompletely characterized on this noncontrast scan. No hydronephrosis. Stomach/Bowel: Grossly normal stomach. Visualized small and large bowel is normal caliber, with no bowel wall thickening. Vascular/Lymphatic: Normal caliber abdominal aorta. No pathologically enlarged lymph nodes in the abdomen. Other: No abdominal ascites or focal fluid collection. Musculoskeletal: No aggressive appearing focal osseous lesions. IMPRESSION: 1. Cholelithiasis.  No MRI findings of acute cholecystitis. 2. No intrahepatic biliary ductal dilatation. Common bile duct diameter 6 mm, top-normal. No evidence of choledocholithiasis. 3. Diffuse iron deposition in the liver and spleen. 4. Symmetric severe bilateral  renal parenchymal atrophy. Innumerable simple and tiny complex renal cysts in both kidneys. Electronically Signed   By: Ilona Sorrel M.D.   On: 11/19/2016 14:13   Mr 3d Recon At Scanner  Result Date: 11/19/2016 CLINICAL DATA:  Inpatient. End-stage renal disease on hemodialysis. Right upper quadrant abdominal pain. Cholelithiasis and dilated common bile duct on abdominal sonogram from earlier today. EXAM: MRI ABDOMEN WITHOUT CONTRAST  (INCLUDING MRCP) TECHNIQUE:  Multiplanar multisequence MR imaging of the abdomen was performed. Heavily T2-weighted images of the biliary and pancreatic ducts were obtained, and three-dimensional MRCP images were rendered by post processing. COMPARISON:  11/19/2016 right upper quadrant abdominal sonogram. 08/13/2016 CT abdomen/pelvis. FINDINGS: Lower chest: Mild hypoventilatory changes in the dependent lung bases. Hepatobiliary: Normal liver size and configuration. Prominent signal loss throughout the liver on the inphase sequence, compatible with diffuse hepatic iron deposition. No liver mass. Nondistended gallbladder contains multiple subcentimeter layering gallstones, with no gallbladder wall thickening or pericholecystic fluid. No intrahepatic biliary ductal dilatation. Common bile duct diameter 6 mm, upper normal, with smooth distal tapering. No biliary filling defects to suggest choledocholithiasis. No biliary strictures or masses. No ampullary mass. Pancreas: No pancreatic mass or duct dilation. 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 Spleen: Normal size spleen. Diffuse hepatic iron deposition in the spleen. No splenic mass. Adrenals/Urinary Tract: Normal adrenals. Symmetric severe bilateral renal parenchymal atrophy. Kidneys are largely replaced by innumerable renal cysts, most of which appear simple measuring up to the 4.0 cm in  the upper left kidney, with a few scattered subcentimeter T1 hyperintense renal cysts in both kidneys probably representing hemorrhagic/proteinaceous renal cysts, although incompletely characterized on this noncontrast scan. No hydronephrosis. Stomach/Bowel: Grossly normal stomach. Visualized small and large bowel is normal caliber, with no bowel wall thickening. Vascular/Lymphatic: Normal caliber abdominal aorta. No pathologically enlarged lymph nodes in the abdomen. Other: No abdominal ascites or focal fluid collection. Musculoskeletal: No aggressive appearing focal osseous lesions. IMPRESSION: 1. Cholelithiasis.  No MRI findings of acute cholecystitis. 2. No intrahepatic biliary ductal dilatation. Common bile duct diameter 6 mm, top-normal. No evidence of choledocholithiasis. 3. Diffuse iron deposition in the liver and spleen. 4. Symmetric severe bilateral renal parenchymal atrophy. Innumerable simple and tiny complex renal cysts in both kidneys. Electronically Signed   By: Ilona Sorrel M.D.   On: 11/19/2016 14:13   US Abdomen Limited Ruq  Result Date: 11/19/2016 CLINICAL DATA:  Right upper quadrant pain starting Monday, renal insufficiency, obesity EXAM: US ABDOMEN LIMITED - RIGHT UPPER QUADRANT COMPARISON:  10/08/2016 FINDINGS: Gallbladder: Gallstones are noted within gallbladder the largest measures 9 mm. No thickening of gallbladder wall. No sonographic Murphy's sign. Common bile duct: Diameter: 6.3 mm in diameter prominent in size for age without CBD filling defects. Liver: No focal lesion identified. Within normal limits in parenchymal echogenicity. There is echogenic right kidney probable chronic medical renal disease. IMPRESSION: 1. Gallstones are noted within gallbladder the largest measures 9 mm. No sonographic Murphy's sign. No thickening of gallbladder wall. 2. CBD measures 6.3 mm in diameter prominent in size for age. No CBD filling defects. 3. Normal liver echogenicity. Electronically Signed    By: Lahoma Crocker M.D.   On: 11/19/2016 08:25      Assessment/Plan Chronic abdominal pain Intractable vomiting  - S/P lap band removal 12/2014 Dr. Alphonsa Overall - previous Dx of gastroparesis based on sxs however gastric emptying study 2016 was normal  - last EGD by Dr. Hilarie Fredrickson 08/2016 w/ normal esophagus and gastric cardia/fundus, retained fluid in gastric body, gastritis and duodenitis (BX negative for malignancy),and normal 2nd portion of duodenum.   Cholelithiasis - non-tender during my exam - 11/19/16 RUQ U/S negative for cholecystitis, MRCP negative for CBD obstruction/mass, CBD 6.0 mm  - Previous HIDA 06/2016 revealed patent cystic duct with borderline EF of 39% (stable  compared to previous HIDA in 2007)  HTN ESRD on HD - Creatinine > 13 today GERD Hepatic steatosis  Morbid obesity   Plan: Patient symptoms and lab results are more consistent with impaired GI motility. Low suspicion for symptomatic cholelithiasis or early cholecystitis, however if patient remains symptomatic over the weekend we will repeat a HIDA scan to r/o cholecystitis.   While it sounds like the patient has already received previous GI work-up in the past, I would recommend GI consult for recommendations. Patient would likely benefit from outpatient GI follow up at discharge.    Jill Alexanders, [facility] 11/20/2016, 11:02 AM Pager: [phone] Consults: [phone] Mon-Fri 7:00 am-4:30 pm Sat-Sun 7:00 am-11:30 am

## 2016-11-20 NOTE — Procedures (Signed)
31 yo AAM admitted with abd pain, N/V.  Has had a number of admissions prior with similar symptoms.  Hx of gastric banding and then reversal.  No fevers, not toxic.  For HD today. BP's stable. Will follow.     I was present at this dialysis session, have reviewed the session itself and made  appropriate changes Kelly Splinter MD [place] pager [phone]   11/20/2016, 4:01 PM

## 2016-11-20 NOTE — Progress Notes (Addendum)
[facility] PROGRESS NOTE  [name] XHB:[id number] DOB: 06-21-1986 DOA: 11/19/2016  PCP: Philis Fendt, MD  Brief History/Interval Summary: 31 year old African-American male with a past medical history of end-stage renal disease on hemodialysis, history of gastroparesis, known cholelithiasis, hypertension, presented with complaints of abdominal pain, nausea and vomiting. Patient has been evaluated for similar complaints in the past. He was hospitalized for further management.  Reason for Visit: Abdominal pain in the setting of cholelithiasis  Consultants: Nephrology. General surgery.  Procedures: Hemodialysis  Antibiotics: none  Subjective/Interval History: Patient continues to complain of right sided abdominal pain. He hasn't had any vomiting since he has been in the hospital. Denies any shortness of breath.  ROS: Denies any chest pain  Objective:  Vital Signs  Vitals:   11/20/16 1100 11/20/16 1130 11/20/16 1200 11/20/16 1317  BP: (!) 107/50 (!) 93/48 (!) 101/53 (!) 112/58  Pulse: 96 92 92 95  Resp:    20  Temp:   97.8 F (36.6 C) 98.7 F (37.1 C)  TempSrc:   Oral Oral  SpO2:   100% 98%  Weight:   (!) 139.6 kg (307 lb 12.2 oz) (!) 142.2 kg (313 lb 7.9 oz)  Height:        Intake/Output Summary (Last 24 hours) at 11/20/16 1343 Last data filed at 11/20/16 1200  Gross per 24 hour  Intake              719 ml  Output                0 ml  Net              719 ml   Filed Weights   11/20/16 0706 11/20/16 1200 11/20/16 1317  Weight: (!) 139.6 kg (307 lb 12.2 oz) (!) 139.6 kg (307 lb 12.2 oz) (!) 142.2 kg (313 lb 7.9 oz)    General appearance: alert, cooperative, appears stated age and no distress Resp: clear to auscultation bilaterally Cardio: regular rate and rhythm, S1, S2 normal, no murmur, click, rub or gallop GI: Abdomen is soft. Tender in the right quadrant without any rebound, rigidity or guarding. No masses or organomegaly. Bowel sounds are  present. Extremities: extremities normal, atraumatic, no cyanosis or edema Neurologic: No focal neurological deficits.  Lab Results:  Data Reviewed: I have personally reviewed following labs and imaging studies  CBC:  Recent Labs Lab 11/17/16 1149 11/18/16 2211 11/19/16 0742 11/20/16 0723  WBC 8.9 10.9* 11.8* 11.8*  NEUTROABS  --   --  7.7  --   HGB 14.1 14.5 15.1 13.4  HCT 41.0 42.3 43.7 39.2  MCV 89.5 90.0 90.7 90.1  PLT 213 207 205 810    Basic Metabolic Panel:  Recent Labs Lab 11/17/16 1149 11/18/16 2211 11/19/16 0742 11/20/16 0723  NA 139 131* 135 138  K 3.9 3.7 4.2 4.3  CL 92* 91* 88* 93*  CO2 25 20* 25 22  GLUCOSE 121* 106* 90 92  BUN 55* 21* 27* 47*  CREATININE 13.93* 8.53* 9.92* 13.89*  CALCIUM 8.2* 8.8* 8.9 8.0*    GFR: Estimated Creatinine Clearance: 10.4 mL/min (A) (by C-G formula based on SCr of 13.89 mg/dL (H)).  Liver Function Tests:  Recent Labs Lab 11/17/16 1149 11/18/16 2211 11/19/16 0742 11/20/16 0723  AST 17 17 15 15   ALT 19 19 19 17   ALKPHOS 64 70 73 58  BILITOT 0.9 1.4* 1.3* 1.1  PROT 8.7* 9.1* 9.4* 8.2*  ALBUMIN 4.5 4.5 4.6  4.1     Recent Labs Lab 11/17/16 1149 11/18/16 2211  LIPASE 24 25     Recent Results (from the past 240 hour(s))  MRSA PCR Screening     Status: None   Collection Time: 11/19/16  4:17 PM  Result Value Ref Range Status   MRSA by PCR NEGATIVE NEGATIVE Final    Comment:        The GeneXpert MRSA Assay (FDA approved for NASAL specimens only), is one component of a comprehensive MRSA colonization surveillance program. It is not intended to diagnose MRSA infection nor to guide or monitor treatment for MRSA infections.       Radiology Studies: Mr Abdomen Mrcp Wo Contrast  Result Date: 11/19/2016 CLINICAL DATA:  Inpatient. End-stage renal disease on hemodialysis. Right upper quadrant abdominal pain. Cholelithiasis and dilated common bile duct on abdominal sonogram from earlier today. EXAM:  MRI ABDOMEN WITHOUT CONTRAST  (INCLUDING MRCP) TECHNIQUE: Multiplanar multisequence MR imaging of the abdomen was performed. Heavily T2-weighted images of the biliary and pancreatic ducts were obtained, and three-dimensional MRCP images were rendered by post processing. COMPARISON:  11/19/2016 right upper quadrant abdominal sonogram. 08/13/2016 CT abdomen/pelvis. FINDINGS: Lower chest: Mild hypoventilatory changes in the dependent lung bases. Hepatobiliary: Normal liver size and configuration. Prominent signal loss throughout the liver on the inphase sequence, compatible with diffuse hepatic iron deposition. No liver mass. Nondistended gallbladder contains multiple subcentimeter layering gallstones, with no gallbladder wall thickening or pericholecystic fluid. No intrahepatic biliary ductal dilatation. Common bile duct diameter 6 mm, upper normal, with smooth distal tapering. No biliary filling defects to suggest choledocholithiasis. No biliary strictures or masses. No ampullary mass. Pancreas: No pancreatic mass or duct dilation. 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 Spleen: Normal size spleen. Diffuse hepatic iron deposition in the spleen. No splenic mass. Adrenals/Urinary Tract: Normal adrenals. Symmetric severe bilateral renal parenchymal atrophy. Kidneys are largely replaced by innumerable renal cysts, most of which appear simple measuring up to the 4.0 cm in the upper left kidney, with a few scattered subcentimeter T1 hyperintense renal cysts in both kidneys probably representing hemorrhagic/proteinaceous renal cysts, although incompletely characterized on this noncontrast scan. No hydronephrosis. Stomach/Bowel: Grossly normal stomach. Visualized small and large bowel is normal caliber, with no bowel wall thickening. Vascular/Lymphatic: Normal caliber abdominal  aorta. No pathologically enlarged lymph nodes in the abdomen. Other: No abdominal ascites or focal fluid collection. Musculoskeletal: No aggressive appearing focal osseous lesions. IMPRESSION: 1. Cholelithiasis.  No MRI findings of acute cholecystitis. 2. No intrahepatic biliary ductal dilatation. Common bile duct diameter 6 mm, top-normal. No evidence of choledocholithiasis. 3. Diffuse iron deposition in the liver and spleen. 4. Symmetric severe bilateral renal parenchymal atrophy. Innumerable simple and tiny complex renal cysts in both kidneys. Electronically Signed   By: Ilona Sorrel M.D.   On: 11/19/2016 14:13   Mr 3d Recon At Scanner  Result Date: 11/19/2016 CLINICAL DATA:  Inpatient. End-stage renal disease on hemodialysis. Right upper quadrant abdominal pain. Cholelithiasis and dilated common bile duct on abdominal sonogram from earlier today. EXAM: MRI ABDOMEN WITHOUT CONTRAST  (INCLUDING MRCP) TECHNIQUE: Multiplanar multisequence MR imaging of the abdomen was performed. Heavily T2-weighted images of the biliary and pancreatic ducts were obtained, and three-dimensional MRCP images were rendered by post processing. COMPARISON:  11/19/2016 right upper quadrant abdominal sonogram. 08/13/2016 CT abdomen/pelvis. FINDINGS: Lower chest: Mild hypoventilatory changes in  the dependent lung bases. Hepatobiliary: Normal liver size and configuration. Prominent signal loss throughout the liver on the inphase sequence, compatible with diffuse hepatic iron deposition. No liver mass. Nondistended gallbladder contains multiple subcentimeter layering gallstones, with no gallbladder wall thickening or pericholecystic fluid. No intrahepatic biliary ductal dilatation. Common bile duct diameter 6 mm, upper normal, with smooth distal tapering. No biliary filling defects to suggest choledocholithiasis. No biliary strictures or masses. No ampullary mass. Pancreas: No pancreatic mass or duct dilation. 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 Spleen: Normal size spleen. Diffuse hepatic iron deposition in the spleen. No splenic mass. Adrenals/Urinary Tract: Normal adrenals. Symmetric severe bilateral renal parenchymal atrophy. Kidneys are largely replaced by innumerable renal cysts, most of which appear simple measuring up to the 4.0 cm in the upper left kidney, with a few scattered subcentimeter T1 hyperintense renal cysts in both kidneys probably representing hemorrhagic/proteinaceous renal cysts, although incompletely characterized on this noncontrast scan. No hydronephrosis. Stomach/Bowel: Grossly normal stomach. Visualized small and large bowel is normal caliber, with no bowel wall thickening. Vascular/Lymphatic: Normal caliber abdominal aorta. No pathologically enlarged lymph nodes in the abdomen. Other: No abdominal ascites or focal fluid collection. Musculoskeletal: No aggressive appearing focal osseous lesions. IMPRESSION: 1. Cholelithiasis.  No MRI findings of acute cholecystitis. 2. No intrahepatic biliary ductal dilatation. Common bile duct diameter 6 mm, top-normal. No evidence of choledocholithiasis. 3. Diffuse iron deposition in the liver and spleen. 4. Symmetric severe bilateral renal parenchymal atrophy. Innumerable simple and tiny complex renal cysts in both kidneys. Electronically Signed   By: Ilona Sorrel M.D.   On: 11/19/2016 14:13   US Abdomen Limited Ruq  Result Date: 11/19/2016 CLINICAL DATA:  Right upper quadrant pain starting Monday, renal insufficiency, obesity EXAM: US ABDOMEN LIMITED - RIGHT UPPER QUADRANT COMPARISON:  10/08/2016 FINDINGS: Gallbladder: Gallstones are noted within gallbladder the largest measures 9 mm. No thickening of gallbladder wall. No sonographic Murphy's sign. Common bile duct: Diameter: 6.3 mm in diameter prominent in size for age without CBD  filling defects. Liver: No focal lesion identified. Within normal limits in parenchymal echogenicity. There is echogenic right kidney probable chronic medical renal disease. IMPRESSION: 1. Gallstones are noted within gallbladder the largest measures 9 mm. No sonographic Murphy's sign. No thickening of gallbladder wall. 2. CBD measures 6.3 mm in diameter prominent in size for age. No CBD filling defects. 3. Normal liver echogenicity. Electronically Signed   By: Lahoma Crocker M.D.   On: 11/19/2016 08:25     Medications:  Scheduled: . acetaminophen      . calcitRIOL  0.5 mcg Oral Q M,W,F-HD  . heparin  5,000 Units Subcutaneous Q8H  . metoCLOPramide (REGLAN) injection  10 mg Intravenous Q8H  . pantoprazole (PROTONIX) IV  40 mg Intravenous Q12H   Continuous: . sodium chloride 30 mL/hr at 11/19/16 0922  . piperacillin-tazobactam (ZOSYN)  IV Stopped (11/20/16 1307)   DVV:[id number] **OR** acetaminophen, albuterol, fentaNYL (SUBLIMAZE) injection, ketorolac, LORazepam, morphine injection, ondansetron (ZOFRAN) IV **OR** promethazine  Assessment/Plan:  Principal Problem:   RUQ pain Active Problems:   Morbid obesity, weight 291, BMI - 47   Lapband April 2016   CKD (chronic kidney disease) stage V requiring chronic dialysis (HCC)   Hepatic steatosis   Cholelithiasis   GERD (gastroesophageal reflux disease)   Essential hypertension   ?? Gastroparesis- NORMAL NM gastric emptying study 2016  Gastritis   Increased anion gap metabolic acidosis   Intractable abdominal pain   Common bile duct dilatation   Cholelithiasis without cholecystitis    RUQ pain/ Cholelithiasis His symptoms are chronic for the most part. At the same time cholelithiasis could be contributing. He has been seen by general surgery in the past, although unclear if he has ever followed up with them in the outpatient setting. He might benefit from being seen by them again. No other reason for this pain has been determined. He  has had gastric banding before, but this has been reversed. He may benefit from cholecystectomy. Had hepatobiliary scan November 2017 with normal ejection fraction and no ductal obstruction. Continue Zosyn for now. Await general surgery input. Seen by gastroenterology recently and underwent upper endoscopy in January. Retained gastric fluid was noted. Also noted to have gastritis and duodenitis. Otherwise, the endoscopy was unremarkable.    Questionable Gastroparesis He had a normal gastric emptying study in 2016. Recent endoscopy did show retained fluid in the stock. Continue Reglan for now.   Gastritis/GERD (gastroesophageal reflux disease) Previous EGD revealed mild gastritis. Was not on PPI, H2 blocker or Carafate prior to admission. Biopsies from January 2018 EGD negative for H. Pylori. Protonix.  End-stage renal disease requiring chronic dialysis  MWF schedule. Nephrology is following.   Essential hypertension Not on medical therapy prior to admission  Increased anion gap metabolic acidosis This is unlikely due to his renal failure and hypovolemia. Patient does not have any history of diabetes.  Morbid obesity Body mass index is 50.6 kg/m.  Hepatic steatosis AST and ALT are normal.   DVT Prophylaxis: Subcutaneous heparin    Code Status: Full code  Family Communication: Discussed with the patient  Disposition Plan: Await general surgery input.    LOS: 0 days   [facility] [place] [facility] Pager [phone] 11/20/2016, 1:43 PM  If 7PM-7AM, please contact night-coverage at [email or web address], password [facility]

## 2016-11-21 ENCOUNTER — Inpatient Hospital Stay (HOSPITAL_COMMUNITY): Payer: Medicare Other

## 2016-11-21 DIAGNOSIS — K2970 Gastritis, unspecified, without bleeding: Secondary | ICD-10-CM

## 2016-11-21 LAB — COMPREHENSIVE METABOLIC PANEL
ALK PHOS: 54 U/L (ref 38–126)
ALT: 20 U/L (ref 17–63)
AST: 20 U/L (ref 15–41)
Albumin: 3.9 g/dL (ref 3.5–5.0)
Anion gap: 23 — ABNORMAL HIGH (ref 5–15)
BUN: 21 mg/dL — AB (ref 6–20)
CALCIUM: 8.4 mg/dL — AB (ref 8.9–10.3)
CHLORIDE: 91 mmol/L — AB (ref 101–111)
CO2: 21 mmol/L — ABNORMAL LOW (ref 22–32)
CREATININE: 9.25 mg/dL — AB (ref 0.61–1.24)
GFR calc Af Amer: 8 mL/min — ABNORMAL LOW (ref >60)
GFR, EST NON AFRICAN AMERICAN: 7 mL/min — AB (ref >60)
Glucose, Bld: 73 mg/dL (ref 65–99)
Potassium: 3.8 mmol/L (ref 3.5–5.1)
Sodium: 135 mmol/L (ref 135–145)
Total Bilirubin: 2 mg/dL — ABNORMAL HIGH (ref 0.3–1.2)
Total Protein: 8.1 g/dL (ref 6.5–8.1)

## 2016-11-21 LAB — CBC
HEMATOCRIT: 40.9 % (ref 39.0–52.0)
HEMOGLOBIN: 13.8 g/dL (ref 13.0–17.0)
MCH: 31 pg (ref 26.0–34.0)
MCHC: 33.7 g/dL (ref 30.0–36.0)
MCV: 91.9 fL (ref 78.0–100.0)
PLATELETS: 165 10*3/uL (ref 150–400)
RBC: 4.45 MIL/uL (ref 4.22–5.81)
RDW: 13.8 % (ref 11.5–15.5)
WBC: 10.1 10*3/uL (ref 4.0–10.5)

## 2016-11-21 MED ORDER — TECHNETIUM TC 99M MEBROFENIN IV KIT
5.0000 | PACK | Freq: Once | INTRAVENOUS | Status: AC | PRN
  Administered 2016-11-21: 5 via INTRAVENOUS

## 2016-11-21 MED ORDER — METHOCARBAMOL 1000 MG/10ML IJ SOLN
500.0000 mg | Freq: Once | INTRAVENOUS | Status: AC
  Administered 2016-11-21: 500 mg via INTRAVENOUS
  Filled 2016-11-21: qty 550

## 2016-11-21 NOTE — Progress Notes (Signed)
[facility] Progress Note     Subjective: CC: nausea  Pt is not currently having abdominal pain. He has had a little nausea since admission but no vomiting. He is having flatus but no BM. No fever or chills overnight.   Objective: Vital signs in last 24 hours: Temp:  [97.8 F (36.6 C)-98.7 F (37.1 C)] 97.9 F (36.6 C) (04/21 0432) Pulse Rate:  [90-101] 93 (04/21 0432) Resp:  [18-20] 20 (04/21 0432) BP: (71-124)/(32-61) 82/60 (04/21 0432) SpO2:  [94 %-100 %] 97 % (04/21 0432) Weight:  [307 lb 12.2 oz (139.6 kg)-313 lb 7.9 oz (142.2 kg)] 309 lb 1.4 oz (140.2 kg) (04/21 0432) Last BM Date: 11/16/16  Intake/Output from previous day: 04/20 0701 - 04/21 0700 In: 590 [I.V.:540; IV Piggyback:50] Out: 0  Intake/Output this shift: No intake/output data recorded.  PE: Gen:  Alert, NAD, pleasant, cooperative, obese lying in bed Card:  RRR, no M/G/R heard Pulm:  CTA, no W/R/R, effort normal Abd: Soft, obese, not distended, +BS, mild generalized TTP with worse tenderness in the RUQ and epigastric region Skin: no rashes noted, warm and dry  Lab Results:   Recent Labs  11/20/16 0723 11/21/16 0551  WBC 11.8* 10.1  HGB 13.4 13.8  HCT 39.2 40.9  PLT 199 165   BMET  Recent Labs  11/20/16 0723 11/21/16 0551  NA 138 135  K 4.3 3.8  CL 93* 91*  CO2 22 21*  GLUCOSE 92 73  BUN 47* 21*  CREATININE 13.89* 9.25*  CALCIUM 8.0* 8.4*   PT/INR No results for input(s): LABPROT, INR in the last 72 hours. CMP     Component Value Date/Time   NA 135 11/21/2016 0551   K 3.8 11/21/2016 0551   CL 91 (L) 11/21/2016 0551   CO2 21 (L) 11/21/2016 0551   GLUCOSE 73 11/21/2016 0551   BUN 21 (H) 11/21/2016 0551   CREATININE 9.25 (H) 11/21/2016 0551   CALCIUM 8.4 (L) 11/21/2016 0551   PROT 8.1 11/21/2016 0551   ALBUMIN 3.9 11/21/2016 0551   AST 20 11/21/2016 0551   ALT 20 11/21/2016 0551   ALKPHOS 54 11/21/2016 0551   BILITOT 2.0 (H) 11/21/2016 0551   GFRNONAA 7 (L)  11/21/2016 0551   GFRAA 8 (L) 11/21/2016 0551   Lipase     Component Value Date/Time   LIPASE 25 11/18/2016 2211       Studies/Results: Mr Abdomen Mrcp Wo Contrast  Result Date: 11/19/2016 CLINICAL DATA:  Inpatient. End-stage renal disease on hemodialysis. Right upper quadrant abdominal pain. Cholelithiasis and dilated common bile duct on abdominal sonogram from earlier today. EXAM: MRI ABDOMEN WITHOUT CONTRAST  (INCLUDING MRCP) TECHNIQUE: Multiplanar multisequence MR imaging of the abdomen was performed. Heavily T2-weighted images of the biliary and pancreatic ducts were obtained, and three-dimensional MRCP images were rendered by post processing. COMPARISON:  11/19/2016 right upper quadrant abdominal sonogram. 08/13/2016 CT abdomen/pelvis. FINDINGS: Lower chest: Mild hypoventilatory changes in the dependent lung bases. Hepatobiliary: Normal liver size and configuration. Prominent signal loss throughout the liver on the inphase sequence, compatible with diffuse hepatic iron deposition. No liver mass. Nondistended gallbladder contains multiple subcentimeter layering gallstones, with no gallbladder wall thickening or pericholecystic fluid. No intrahepatic biliary ductal dilatation. Common bile duct diameter 6 mm, upper normal, with smooth distal tapering. No biliary filling defects to suggest choledocholithiasis. No biliary strictures or masses. No ampullary mass. Pancreas: No pancreatic mass or duct dilation. 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 Spleen: Normal size spleen. Diffuse hepatic iron deposition in the spleen. No splenic mass. Adrenals/Urinary Tract: Normal adrenals. Symmetric severe bilateral renal parenchymal atrophy. Kidneys are largely replaced by innumerable renal cysts, most of which appear simple measuring up to the 4.0 cm in the upper  left kidney, with a few scattered subcentimeter T1 hyperintense renal cysts in both kidneys probably representing hemorrhagic/proteinaceous renal cysts, although incompletely characterized on this noncontrast scan. No hydronephrosis. Stomach/Bowel: Grossly normal stomach. Visualized small and large bowel is normal caliber, with no bowel wall thickening. Vascular/Lymphatic: Normal caliber abdominal aorta. No pathologically enlarged lymph nodes in the abdomen. Other: No abdominal ascites or focal fluid collection. Musculoskeletal: No aggressive appearing focal osseous lesions. IMPRESSION: 1. Cholelithiasis.  No MRI findings of acute cholecystitis. 2. No intrahepatic biliary ductal dilatation. Common bile duct diameter 6 mm, top-normal. No evidence of choledocholithiasis. 3. Diffuse iron deposition in the liver and spleen. 4. Symmetric severe bilateral renal parenchymal atrophy. Innumerable simple and tiny complex renal cysts in both kidneys. Electronically Signed   By: Ilona Sorrel M.D.   On: 11/19/2016 14:13   Mr 3d Recon At Scanner  Result Date: 11/19/2016 CLINICAL DATA:  Inpatient. End-stage renal disease on hemodialysis. Right upper quadrant abdominal pain. Cholelithiasis and dilated common bile duct on abdominal sonogram from earlier today. EXAM: MRI ABDOMEN WITHOUT CONTRAST  (INCLUDING MRCP) TECHNIQUE: Multiplanar multisequence MR imaging of the abdomen was performed. Heavily T2-weighted images of the biliary and pancreatic ducts were obtained, and three-dimensional MRCP images were rendered by post processing. COMPARISON:  11/19/2016 right upper quadrant abdominal sonogram. 08/13/2016 CT abdomen/pelvis. FINDINGS: Lower chest: Mild hypoventilatory changes in the dependent lung bases. Hepatobiliary: Normal liver size and configuration. Prominent signal loss throughout the liver on the inphase sequence, compatible with diffuse hepatic iron deposition. No liver mass. Nondistended gallbladder contains multiple  subcentimeter layering gallstones, with no gallbladder wall thickening or pericholecystic fluid. No intrahepatic biliary ductal dilatation. Common bile duct diameter 6 mm, upper normal, with smooth distal tapering. No biliary filling defects to suggest choledocholithiasis. No biliary strictures or masses. No ampullary mass. Pancreas: No pancreatic mass or duct dilation. 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 Spleen: Normal size spleen. Diffuse hepatic iron deposition in the spleen. No splenic mass. Adrenals/Urinary Tract: Normal adrenals. Symmetric severe bilateral renal parenchymal atrophy. Kidneys are largely replaced by innumerable renal cysts, most of which appear simple measuring up to the 4.0 cm in the upper left kidney, with a few scattered subcentimeter T1 hyperintense renal cysts in both kidneys probably representing hemorrhagic/proteinaceous renal cysts, although incompletely characterized on this noncontrast scan. No hydronephrosis. Stomach/Bowel: Grossly normal stomach. Visualized small and large bowel is normal caliber, with no bowel wall thickening. Vascular/Lymphatic: Normal caliber abdominal aorta. No pathologically enlarged lymph nodes in the abdomen. Other: No abdominal ascites or focal fluid collection. Musculoskeletal: No aggressive appearing focal osseous lesions. IMPRESSION: 1. Cholelithiasis.  No MRI findings of acute cholecystitis. 2. No intrahepatic biliary ductal dilatation. Common bile duct diameter 6 mm, top-normal. No evidence of choledocholithiasis. 3. Diffuse iron deposition in the liver and spleen. 4. Symmetric severe bilateral renal parenchymal atrophy. Innumerable simple and tiny complex renal cysts in both kidneys. Electronically Signed   By: Ilona Sorrel M.D.   On: 11/19/2016 14:13  Anti-infectives: Anti-infectives    Start      Dose/Rate Route Frequency Ordered Stop   11/19/16 1300  piperacillin-tazobactam (ZOSYN) IVPB 3.375 g     3.375 g 12.5 mL/hr over 240 Minutes Intravenous Every 12 hours 11/19/16 1236         Assessment/Plan Chronic abdominal pain Intractable vomiting  - S/P lap band removal 12/2014 Dr. Alphonsa Overall - previous Dx of gastroparesis based on sxs however gastric emptying study 2016 was normal  - last EGD by Dr. Hilarie Fredrickson 08/2016 w/ normal esophagus and gastric cardia/fundus, retained fluid in gastric body, gastritis and duodenitis (BX negative for malignancy),and normal 2nd portion of duodenum.   Cholelithiasis - 11/19/16 RUQ U/S negative for cholecystitis, MRCP negative for CBD obstruction/mass, CBD 6.0 mm  - Previous HIDA 06/2016 revealed patent cystic duct with borderline EF of 39% (stable compared to previous HIDA in 2007)  HTN ESRD on HD GERD Hepatic steatosis  Morbid obesity   Plan: Low suspicion for cholecystitis, but higher suspicion for symptomatic cholelithiasis. Symptoms could also be 2/2 impaired GI motility. Will repeat a HIDA scan. Keep NPO until after HIDA  While it sounds like the patient has already received previous GI work-up in the past, patient would likely benefit from outpatient GI follow up at discharge.     LOS: 1 day    Kalman Drape , [facility] 11/21/2016, 8:43 AM Pager: [phone] Consults: [phone] Mon-Fri 7:00 am-4:30 pm Sat-Sun 7:00 am-11:30 am

## 2016-11-21 NOTE — Progress Notes (Signed)
Patient complained of muscle spasms during night, in leg and hand. He stated that he does have them at home but they are not usually this bad. He takes Robaxin at home. On call notified and a 1 time order of Robaxin was given. Per patient this did give him relief. Jimmie Molly, RN

## 2016-11-21 NOTE — Progress Notes (Signed)
[facility] PROGRESS NOTE  MARTIN BELLING TXH:[id number] DOB: 08-Feb-1986 DOA: 11/19/2016  PCP: Philis Fendt, MD  Brief History/Interval Summary: 31 year old African-American male with a past medical history of end-stage renal disease on hemodialysis, history of gastroparesis, known cholelithiasis, hypertension, presented with complaints of abdominal pain, nausea and vomiting. Patient has been evaluated for similar complaints in the past. He was hospitalized for further management.  Reason for Visit: Abdominal pain in the setting of cholelithiasis  Consultants: Nephrology. General surgery.  Procedures: Hemodialysis  Antibiotics: none  Subjective/Interval History: Patient states that his pain level has improved, although he continues to have discomfort in his right upper abdomen. Hasn't had any further episodes of nausea or vomiting.   ROS: Denies any chest pain  Objective:  Vital Signs  Vitals:   11/20/16 1317 11/20/16 1450 11/20/16 2200 11/21/16 0432  BP: (!) 112/58 (!) 90/48 124/61 (!) 82/60  Pulse: 95 95 (!) 101 93  Resp: 20 18 18 20   Temp: 98.7 F (37.1 C) 98.5 F (36.9 C) 98.3 F (36.8 C) 97.9 F (36.6 C)  TempSrc: Oral Oral Oral Oral  SpO2: 98% 94% 97% 97%  Weight: (!) 142.2 kg (313 lb 7.9 oz)   (!) 140.2 kg (309 lb 1.4 oz)  Height:        Intake/Output Summary (Last 24 hours) at 11/21/16 0944 Last data filed at 11/21/16 2023  Gross per 24 hour  Intake              590 ml  Output                0 ml  Net              590 ml   Filed Weights   11/20/16 1200 11/20/16 1317 11/21/16 0432  Weight: (!) 139.6 kg (307 lb 12.2 oz) (!) 142.2 kg (313 lb 7.9 oz) (!) 140.2 kg (309 lb 1.4 oz)    General appearance: alert, cooperative, appears stated age and no distress Resp: clear to auscultation bilaterally Cardio: regular rate and rhythm, S1, S2 normal, no murmur, click, rub or gallop GI: Abdomen is soft. Less tender in the right quadrant compared to  yesterday. No rebound, rigidity or guarding. Murphy sign Equivocal. No masses or organomegaly. Bowel sounds are present.  Extremities: Minimal edema  Neurologic: No focal neurological deficits.  Lab Results:  Data Reviewed: I have personally reviewed following labs and imaging studies  CBC:  Recent Labs Lab 11/17/16 1149 11/18/16 2211 11/19/16 0742 11/20/16 0723 11/21/16 0551  WBC 8.9 10.9* 11.8* 11.8* 10.1  NEUTROABS  --   --  7.7  --   --   HGB 14.1 14.5 15.1 13.4 13.8  HCT 41.0 42.3 43.7 39.2 40.9  MCV 89.5 90.0 90.7 90.1 91.9  PLT 213 207 205 199 343    Basic Metabolic Panel:  Recent Labs Lab 11/17/16 1149 11/18/16 2211 11/19/16 0742 11/20/16 0723 11/21/16 0551  NA 139 131* 135 138 135  K 3.9 3.7 4.2 4.3 3.8  CL 92* 91* 88* 93* 91*  CO2 25 20* 25 22 21*  GLUCOSE 121* 106* 90 92 73  BUN 55* 21* 27* 47* 21*  CREATININE 13.93* 8.53* 9.92* 13.89* 9.25*  CALCIUM 8.2* 8.8* 8.9 8.0* 8.4*    GFR: Estimated Creatinine Clearance: 15.4 mL/min (A) (by C-G formula based on SCr of 9.25 mg/dL (H)).  Liver Function Tests:  Recent Labs Lab 11/17/16 1149 11/18/16 2211 11/19/16 5686 11/20/16 1683 11/21/16 7290  AST 17 17 15 15 20   ALT 19 19 19 17 20   ALKPHOS 64 70 73 58 54  BILITOT 0.9 1.4* 1.3* 1.1 2.0*  PROT 8.7* 9.1* 9.4* 8.2* 8.1  ALBUMIN 4.5 4.5 4.6 4.1 3.9     Recent Labs Lab 11/17/16 1149 11/18/16 2211  LIPASE 24 25     Recent Results (from the past 240 hour(s))  MRSA PCR Screening     Status: None   Collection Time: 11/19/16  4:17 PM  Result Value Ref Range Status   MRSA by PCR NEGATIVE NEGATIVE Final    Comment:        The GeneXpert MRSA Assay (FDA approved for NASAL specimens only), is one component of a comprehensive MRSA colonization surveillance program. It is not intended to diagnose MRSA infection nor to guide or monitor treatment for MRSA infections.       Radiology Studies: Mr Abdomen Mrcp Wo Contrast  Result Date:  11/19/2016 CLINICAL DATA:  Inpatient. End-stage renal disease on hemodialysis. Right upper quadrant abdominal pain. Cholelithiasis and dilated common bile duct on abdominal sonogram from earlier today. EXAM: MRI ABDOMEN WITHOUT CONTRAST  (INCLUDING MRCP) TECHNIQUE: Multiplanar multisequence MR imaging of the abdomen was performed. Heavily T2-weighted images of the biliary and pancreatic ducts were obtained, and three-dimensional MRCP images were rendered by post processing. COMPARISON:  11/19/2016 right upper quadrant abdominal sonogram. 08/13/2016 CT abdomen/pelvis. FINDINGS: Lower chest: Mild hypoventilatory changes in the dependent lung bases. Hepatobiliary: Normal liver size and configuration. Prominent signal loss throughout the liver on the inphase sequence, compatible with diffuse hepatic iron deposition. No liver mass. Nondistended gallbladder contains multiple subcentimeter layering gallstones, with no gallbladder wall thickening or pericholecystic fluid. No intrahepatic biliary ductal dilatation. Common bile duct diameter 6 mm, upper normal, with smooth distal tapering. No biliary filling defects to suggest choledocholithiasis. No biliary strictures or masses. No ampullary mass. Pancreas: No pancreatic mass or duct dilation. 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 Spleen: Normal size spleen. Diffuse hepatic iron deposition in the spleen. No splenic mass. Adrenals/Urinary Tract: Normal adrenals. Symmetric severe bilateral renal parenchymal atrophy. Kidneys are largely replaced by innumerable renal cysts, most of which appear simple measuring up to the 4.0 cm in the upper left kidney, with a few scattered subcentimeter T1 hyperintense renal cysts in both kidneys probably representing hemorrhagic/proteinaceous renal cysts, although incompletely characterized on this  noncontrast scan. No hydronephrosis. Stomach/Bowel: Grossly normal stomach. Visualized small and large bowel is normal caliber, with no bowel wall thickening. Vascular/Lymphatic: Normal caliber abdominal aorta. No pathologically enlarged lymph nodes in the abdomen. Other: No abdominal ascites or focal fluid collection. Musculoskeletal: No aggressive appearing focal osseous lesions. IMPRESSION: 1. Cholelithiasis.  No MRI findings of acute cholecystitis. 2. No intrahepatic biliary ductal dilatation. Common bile duct diameter 6 mm, top-normal. No evidence of choledocholithiasis. 3. Diffuse iron deposition in the liver and spleen. 4. Symmetric severe bilateral renal parenchymal atrophy. Innumerable simple and tiny complex renal cysts in both kidneys. Electronically Signed   By: Ilona Sorrel M.D.   On: 11/19/2016 14:13   Mr 3d Recon At Scanner  Result Date: 11/19/2016 CLINICAL DATA:  Inpatient. End-stage renal disease on hemodialysis. Right upper quadrant abdominal pain. Cholelithiasis and dilated common bile duct on abdominal sonogram from earlier today. EXAM: MRI ABDOMEN WITHOUT CONTRAST  (INCLUDING MRCP) TECHNIQUE: Multiplanar multisequence MR imaging of the abdomen  was performed. Heavily T2-weighted images of the biliary and pancreatic ducts were obtained, and three-dimensional MRCP images were rendered by post processing. COMPARISON:  11/19/2016 right upper quadrant abdominal sonogram. 08/13/2016 CT abdomen/pelvis. FINDINGS: Lower chest: Mild hypoventilatory changes in the dependent lung bases. Hepatobiliary: Normal liver size and configuration. Prominent signal loss throughout the liver on the inphase sequence, compatible with diffuse hepatic iron deposition. No liver mass. Nondistended gallbladder contains multiple subcentimeter layering gallstones, with no gallbladder wall thickening or pericholecystic fluid. No intrahepatic biliary ductal dilatation. Common bile duct diameter 6 mm, upper normal, with smooth  distal tapering. No biliary filling defects to suggest choledocholithiasis. No biliary strictures or masses. No ampullary mass. Pancreas: No pancreatic mass or duct dilation. 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 Spleen: Normal size spleen. Diffuse hepatic iron deposition in the spleen. No splenic mass. Adrenals/Urinary Tract: Normal adrenals. Symmetric severe bilateral renal parenchymal atrophy. Kidneys are largely replaced by innumerable renal cysts, most of which appear simple measuring up to the 4.0 cm in the upper left kidney, with a few scattered subcentimeter T1 hyperintense renal cysts in both kidneys probably representing hemorrhagic/proteinaceous renal cysts, although incompletely characterized on this noncontrast scan. No hydronephrosis. Stomach/Bowel: Grossly normal stomach. Visualized small and large bowel is normal caliber, with no bowel wall thickening. Vascular/Lymphatic: Normal caliber abdominal aorta. No pathologically enlarged lymph nodes in the abdomen. Other: No abdominal ascites or focal fluid collection. Musculoskeletal: No aggressive appearing focal osseous lesions. IMPRESSION: 1. Cholelithiasis.  No MRI findings of acute cholecystitis. 2. No intrahepatic biliary ductal dilatation. Common bile duct diameter 6 mm, top-normal. No evidence of choledocholithiasis. 3. Diffuse iron deposition in the liver and spleen. 4. Symmetric severe bilateral renal parenchymal atrophy. Innumerable simple and tiny complex renal cysts in both kidneys. Electronically Signed   By: Ilona Sorrel M.D.   On: 11/19/2016 14:13     Medications:  Scheduled: . calcitRIOL  0.5 mcg Oral Q M,W,F-HD  . heparin  5,000 Units Subcutaneous Q8H  . metoCLOPramide (REGLAN) injection  10 mg Intravenous Q8H  . pantoprazole (PROTONIX) IV  40 mg Intravenous Q12H   Continuous: .  sodium chloride 30 mL/hr at 11/19/16 0922  . piperacillin-tazobactam (ZOSYN)  IV Stopped (11/21/16 0500)   SAY:[id number] **OR** acetaminophen, albuterol, fentaNYL (SUBLIMAZE) injection, ketorolac, LORazepam, ondansetron (ZOFRAN) IV **OR** promethazine  Assessment/Plan:  Principal Problem:   RUQ pain Active Problems:   Morbid obesity, weight 291, BMI - 47   Lapband April 2016   CKD (chronic kidney disease) stage V requiring chronic dialysis (HCC)   Hepatic steatosis   Cholelithiasis   GERD (gastroesophageal reflux disease)   Essential hypertension   ?? Gastroparesis- NORMAL NM gastric emptying study 2016   Gastritis   Increased anion gap metabolic acidosis   Intractable abdominal pain   Common bile duct dilatation   Cholelithiasis without cholecystitis   RUQ abdominal pain    RUQ pain/Cholelithiasis His symptoms are chronic for the most part. At the same time cholelithiasis could be contributing. He has been seen by general surgery in the past, although unclear if he has ever followed up with them in the outpatient setting. General surgery was consulted. They have seen the patient. They've ordered a HIDA scan for today. No other reason for his pain has been determined. He has been seen by gastroenterology previously as well. He has had gastric banding before,  but this has been reversed. Had hepatobiliary scan November 2017 with normal ejection fraction and no ductal obstruction. Continue Zosyn for now. Patient underwent upper endoscopy in January. Retained gastric fluid was noted. Also noted to have gastritis and duodenitis. Otherwise, the endoscopy was unremarkable.    Questionable Gastroparesis He had a normal gastric emptying study in 2016. Recent endoscopy did show retained fluid in the stomach. Continue Reglan for now. Change to oral once he is placed on a diet.  Gastritis/GERD (gastroesophageal reflux disease) Previous EGD revealed mild gastritis. Was not on PPI, H2  blocker or Carafate prior to admission. Biopsies from January 2018 EGD negative for H. Pylori. Protonix. Changed to oral once he is placed on a diet.  End-stage renal disease requiring chronic dialysis  MWF schedule. Nephrology is following. Being dialyzed as per his usual schedule.   Essential hypertension Not on medical therapy prior to admission  Increased anion gap metabolic acidosis Has been chronically elevated. This is likely due to his renal failure. Patient does not have any history of diabetes.  Morbid obesity Body mass index is 50.6 kg/m.  Hepatic steatosis AST and ALT are normal.   DVT Prophylaxis: Subcutaneous heparin    Code Status: Full code  Family Communication: Discussed with the patient  Disposition Plan: Appreciate general surgery input. HIDA scan is ordered for today.    LOS: 1 day   [place] [place] [facility] Pager [phone] 11/21/2016, 9:44 AM  If 7PM-7AM, please contact night-coverage at [email or web address], password [email or web address]

## 2016-11-21 NOTE — Consult Note (Signed)
[facility] Renal Consultation Note  Indication for Consultation:  Management of ESRD/hemodialysis; anemia, hypertension/volume and secondary hyperparathyroidism  HPI: [name] is a 31 y.o. 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 Now admitted in patient with US  showing no cholecystitis but 6.41m CBD w/ previous study 6 [place] prior showing a CBD of 3.852m, admit team ordered MRCP. And consulted [facility] with  ordered Hida scan today. We are notified for need fo HD / ESRD issues  Tolerated HD yesterday with out issues . Noted slightly  Below edw 140.2 (edw 141kg) With his decreased po intake with his abdominal symptoms . K 3.8 today   Past Medical History:  Diagnosis Date  . Arthritis    "all over" (11/19/2016)  . Chronic lower back pain   . Complication of anesthesia    A little while to wake up after knee surgery in 2008  . Dizziness    when coming off of dialysis  . [facility]  . Family history of anesthesia complication    mom slow to wake up  . GERD (gastroesophageal reflux disease)    takes Omeprazole as needed  . HeDXIPJASN(053.9   "monthly" (11/19/2016)  . History of hiatal hernia   . [place]  . Hypertension   . Joint pain   . Joint swelling   . Morbid obesity ([facility]  . PONV (postoperative nausea and vomiting)   . Renal insufficiency     Past Surgical History:  Procedure Laterality Date  . AV FISTULA PLACEMENT Bilateral 2005,2007   "right; left"  . AV FISTULA REPAIR Right 2007   "tried to clean it out; couldn't do it"  .  BREATH TEK H PYLORI N/A 05/15/2014   Procedure: BREATH TEK H PYLORI;  Surgeon: DaAlphonsa OverallMD;  [facility];  Service: General;  Laterality: N/A;  . ESOPHAGOGASTRODUODENOSCOPY N/A 12/07/2014   Procedure: ESOPHAGOGASTRODUODENOSCOPY (EGD);  Surgeon: DaAlphonsa OverallMD;  [facility];  Service: General;  Laterality: N/A;  . ESOPHAGOGASTRODUODENOSCOPY N/A 12/17/2014   Procedure: ESOPHAGOGASTRODUODENOSCOPY (EGD);  Surgeon: DaAlphonsa OverallMD;  [place] [facility] Service: General;  Laterality: N/A;  . ESOPHAGOGASTRODUODENOSCOPY N/A 08/16/2016   Procedure: ESOPHAGOGASTRODUODENOSCOPY (EGD);  Surgeon: JaJerene BearsMD;  [facility] Service: Gastroenterology;  Laterality: N/A;  . HERNIA REPAIR  11/2014   w/lap band OR  . JOINT REPLACEMENT    . KNEE ARTHROSCOPY Left 2007  . LAPAROSCOPIC GASTRIC BANDING N/A 11/12/2014   Procedure: LAPAROSCOPIC GASTRIC BANDING;  Surgeon: DaAlphonsa OverallMD;  Location[facility];  Service: General;  Laterality: N/A;  . PARATHYROIDECTOMY N/A 03/30/2013   Procedure: TOTAL PARATHYROIDECTOMY WITH AUTOTRANSPLANT;  Surgeon: ToEarnstine RegalMD;  [place] [facility] Service: General;  Laterality: N/A;  Autotransplant to left lower arm.  . Marland KitchenOTAL KNEE ARTHROPLASTY Right 03/27/2014   Procedure: UNICOMPARTMENTAL ARTHROPLASTY;  Surgeon: GrMeredith PelMD;  [facility] Service: Orthopedics;  Laterality: Right;      Family History  Problem Relation Age of Onset  . Hypertension Mother   . Diabetes Father   . Kidney Stones Sister   .  Diabetes Maternal Grandmother   . Liver cancer Maternal Uncle       reports that he has been smoking Cigarettes.  He has a 1.60 pack-year smoking history. He has never used smokeless tobacco. He reports that he does not drink alcohol or use drugs.   Allergies  Allergen Reactions  . Dilaudid [Hydromorphone Hcl] Other (See Comments)    Patient became hypoxic to 60% with dilaudid 64m IV    Prior to Admission medications    Medication Sig Start Date End Date Taking? Authorizing Provider  albuterol (PROVENTIL HFA;VENTOLIN HFA) 108 (90 Base) MCG/ACT inhaler Inhale 2 puffs into the lungs every 4 (four) hours as needed for wheezing or shortness of breath. 05/14/16  Yes WLysbeth Penner [name]  calcium carbonate (TUMS - DOSED IN MG ELEMENTAL CALCIUM) 500 MG chewable tablet Chew 2 tablets by mouth 2 (two) times daily as needed for indigestion or heartburn.    Yes Historical Provider, MD  glycopyrrolate (ROBINUL) 2 MG tablet Take 1 tablet (2 mg total) by mouth 2 (two) times daily. 01/25/15  Yes RInda Castle MD  Hyoscyamine Sulfate 0.375 MG TBCR Take 1 tablet twice daily Patient taking differently: Take 0.375 mg by mouth 2 (two) times daily.  01/22/15  Yes RInda Castle MD  lidocaine (XYLOCAINE) 2 % jelly Apply 1 application topically as needed. Patient taking differently: Apply 1 application topically daily as needed (pain).  02/19/16  Yes Benjamin Cartner, [name]  methocarbamol (ROBAXIN) 500 MG tablet Take 1 tablet (500 mg total) by mouth every 8 (eight) hours as needed for muscle spasms. 06/15/16  Yes ITheodis Blaze MD  metoCLOPramide (REGLAN) 5 MG tablet Take 1 tablet (5 mg total) by mouth 2 (two) times daily before a meal. 10/09/16  Yes RPatrecia Pour MD  multivitamin (RENA-VIT) TABS tablet Take 1 tablet by mouth daily.   Yes Historical Provider, MD  omeprazole (PRILOSEC) 20 MG capsule Take 1 capsule (20 mg total) by mouth daily. 07/27/16  Yes SEzequiel Essex MD  ondansetron (ZOFRAN) 4 MG tablet Take 1 tablet (4 mg total) by mouth every 8 (eight) hours as needed for nausea or vomiting. 10/09/16  Yes RPatrecia Pour MD  promethazine (PHENERGAN) 25 MG suppository Place 1 suppository (25 mg total) rectally every 6 (six) hours as needed for nausea or vomiting. 11/17/16  Yes JMargette Fast MD  promethazine (PHENERGAN) 25 MG tablet Take 1 tablet (25 mg total) by mouth every 6 (six) hours as needed for nausea or vomiting. 11/17/16  Yes  JMargette Fast MD  sevelamer carbonate (RENVELA) 2.4 G PACK Take 2.4 g by mouth 3 (three) times daily with meals.    Yes Historical Provider, MD  traMADol (ULTRAM) 50 MG tablet Take 1 tablet (50 mg total) by mouth every 8 (eight) hours as needed. Patient taking differently: Take 50 mg by mouth every 6 (six) hours as needed (for pain).  06/15/16  Yes ITheodis Blaze MD  VOLTAREN 1 % GEL Apply 4 g topically 4 (four) times daily. Patient taking differently: Apply 4 g topically as needed.  02/04/16  Yes April Palumbo, MD    PWNU:[id number]**OR** acetaminophen, albuterol, fentaNYL (SUBLIMAZE) injection, ketorolac, LORazepam, ondansetron (ZOFRAN) IV **OR** promethazine  Results for orders placed or performed during the hospital encounter of 11/19/16 (from the past 48 hour(s))  MRSA PCR Screening     Status: None   Collection Time: 11/19/16  4:17 PM  Result Value Ref Range   MRSA by PCR  NEGATIVE NEGATIVE    Comment:        The GeneXpert MRSA Assay (FDA approved for NASAL specimens only), is one component of a comprehensive MRSA colonization surveillance program. It is not intended to diagnose MRSA infection nor to guide or monitor treatment for MRSA infections.   Comprehensive metabolic panel     Status: Abnormal   Collection Time: 11/20/16  7:23 AM  Result Value Ref Range   Sodium 138 135 - 145 mmol/L   Potassium 4.3 3.5 - 5.1 mmol/L   Chloride 93 (L) 101 - 111 mmol/L   CO2 22 22 - 32 mmol/L   Glucose, Bld 92 65 - 99 mg/dL   BUN 47 (H) 6 - 20 mg/dL   Creatinine, Ser 13.89 (H) 0.61 - 1.24 mg/dL   Calcium 8.0 (L) 8.9 - 10.3 mg/dL   Total Protein 8.2 (H) 6.5 - 8.1 g/dL   Albumin 4.1 3.5 - 5.0 g/dL   AST 15 15 - 41 U/L   ALT 17 17 - 63 U/L   Alkaline Phosphatase 58 38 - 126 U/L   Total Bilirubin 1.1 0.3 - 1.2 mg/dL   GFR calc non Af Amer 4 (L) >60 mL/min   GFR calc Af Amer 5 (L) >60 mL/min    Comment: (NOTE) The eGFR has been calculated using the CKD EPI equation. This calculation  has not been validated in all clinical situations. eGFR's persistently <60 mL/min signify possible Chronic Kidney Disease.    Anion gap 23 (H) 5 - 15  CBC     Status: Abnormal   Collection Time: 11/20/16  7:23 AM  Result Value Ref Range   WBC 11.8 (H) 4.0 - 10.5 K/uL   RBC 4.35 4.22 - 5.81 MIL/uL   Hemoglobin 13.4 13.0 - 17.0 g/dL   HCT 39.2 39.0 - 52.0 %   MCV 90.1 78.0 - 100.0 fL   MCH 30.8 26.0 - 34.0 pg   MCHC 34.2 30.0 - 36.0 g/dL   RDW 13.7 11.5 - 15.5 %   Platelets 199 150 - 400 K/uL  CBC     Status: None   Collection Time: 11/21/16  5:51 AM  Result Value Ref Range   WBC 10.1 4.0 - 10.5 K/uL   RBC 4.45 4.22 - 5.81 MIL/uL   Hemoglobin 13.8 13.0 - 17.0 g/dL   HCT 40.9 39.0 - 52.0 %   MCV 91.9 78.0 - 100.0 fL   MCH 31.0 26.0 - 34.0 pg   MCHC 33.7 30.0 - 36.0 g/dL   RDW 13.8 11.5 - 15.5 %   Platelets 165 150 - 400 K/uL  Comprehensive metabolic panel     Status: Abnormal   Collection Time: 11/21/16  5:51 AM  Result Value Ref Range   Sodium 135 135 - 145 mmol/L   Potassium 3.8 3.5 - 5.1 mmol/L   Chloride 91 (L) 101 - 111 mmol/L   CO2 21 (L) 22 - 32 mmol/L   Glucose, Bld 73 65 - 99 mg/dL   BUN 21 (H) 6 - 20 mg/dL   Creatinine, Ser 9.25 (H) 0.61 - 1.24 mg/dL    Comment: DELTA CHECK NOTED   Calcium 8.4 (L) 8.9 - 10.3 mg/dL   Total Protein 8.1 6.5 - 8.1 g/dL   Albumin 3.9 3.5 - 5.0 g/dL   AST 20 15 - 41 U/L   ALT 20 17 - 63 U/L   Alkaline Phosphatase 54 38 - 126 U/L   Total Bilirubin 2.0 (H) 0.3 -  1.2 mg/dL   GFR calc non Af Amer 7 (L) >60 mL/min   GFR calc Af Amer 8 (L) >60 mL/min    Comment: (NOTE) The eGFR has been calculated using the CKD EPI equation. This calculation has not been validated in all clinical situations. eGFR's persistently <60 mL/min signify possible Chronic Kidney Disease.    Anion gap 23 (H) 5 - 15     ROS: as in HPI , did co some cramps at op kidney center leaving above his  Before admit symptoms   Physical Exam: Vitals:   11/20/16  2200 11/21/16 0432  BP: 124/61 (!) 82/60  Pulse: (!) 101 93  Resp: 18 20  Temp: 98.3 F (36.8 C) 97.9 F (36.6 C)     General:Alert Obese AAM , NAd  Ox3 HEENT: [place], EOMI , MMM   Neck: supple, no jvd Heart: RRR no  MRG Lungs: CTA , Non labored breathing Abdomen: BS pos / obese/ Tender R Quad  Tenderness , no rebound  Extremities: Trace bipedal edema  Skin: noovert rash or pedal ulcers  Neuro: alert OX3, moves all extrem , no acute  focal deficits appreciated  Dialysis Access: pos bruit LFA AVF  Dialysis Orders: Center: [place]   on MWF . EDW 141 , (last 3 tx post wts= 142.2  Full Tx , 143.6 half tx time , 144.2 half tx time HD Bath 2k, 2.5 ca  Time 4hr 48mn Heparin 12000. Access LFA AVF  BFR 450   DFR 800    Calcitriol PO 0.5 mcg /HD  Assessment/Plan 1. Abdominal Pain ho Cholelithiasis/ Lap band / GERD-/Gastritis - wu per adit / [facility] seeing  2. ESRD -  HD MWF on schedule / next  HD Monday 4/23 3. Hypertension/volume  - this am lowish  bp asymp sec  ? Pain meds effect  / no bp meds as op / below edw  With little po intake this admit / 4. Anemia  Of ESRD  No esa needs as OP /hgb 13.8  5. Metabolic bone disease -  PO vit d on hd / Renvela as binder when eating  6. Morbid Obesity (ho Lap band  12/2014 removed sec to discomfort )   DErnest Haber [name] CStirling [phone]/21/2018, 11:26 AM   Pt seen, examined and agree w A/P as above.  RKelly SplinterMD CNewell Rubbermaidpager [phone]  11/21/2016, 4:19 PM

## 2016-11-22 MED ORDER — DOXERCALCIFEROL 4 MCG/2ML IV SOLN
2.0000 ug | INTRAVENOUS | Status: DC
  Administered 2016-11-23 – 2016-11-25 (×2): 2 ug via INTRAVENOUS
  Filled 2016-11-22 (×2): qty 2

## 2016-11-22 NOTE — Progress Notes (Signed)
[facility] PROGRESS NOTE  [name] NFA:[id number] DOB: 1985-11-03 DOA: 11/19/2016  PCP: Philis Fendt, MD  Brief History/Interval Summary: 31 year old African-American male with a past medical history of end-stage renal disease on hemodialysis, history of gastroparesis, known cholelithiasis, hypertension, presented with complaints of abdominal pain, nausea and vomiting. Patient has been evaluated for similar complaints in the past. He was hospitalized for further management.  Reason for Visit: Abdominal pain in the setting of cholelithiasis  Consultants: Nephrology. General surgery.  Procedures: Hemodialysis  Antibiotics: none  Subjective/Interval History: Patient feels well. Continues to have some discomfort in his right upper abdomen. No further episodes of nausea or vomiting.    ROS: Denies any chest pain  Objective:  Vital Signs  Vitals:   11/20/16 2200 11/21/16 0432 11/21/16 2042 11/22/16 0450  BP: 124/61 (!) 82/60 111/61 124/70  Pulse: (!) 101 93 94 92  Resp: 18 20 20 18   Temp: 98.3 F (36.8 C) 97.9 F (36.6 C) 98.8 F (37.1 C) 98.7 F (37.1 C)  TempSrc: Oral Oral Oral Oral  SpO2: 97% 97% 100% 100%  Weight:  (!) 140.2 kg (309 lb 1.4 oz)  (!) 141.7 kg (312 lb 6.3 oz)  Height:        Intake/Output Summary (Last 24 hours) at 11/22/16 0928 Last data filed at 11/22/16 0400  Gross per 24 hour  Intake              787 ml  Output                0 ml  Net              787 ml   Filed Weights   11/20/16 1317 11/21/16 0432 11/22/16 0450  Weight: (!) 142.2 kg (313 lb 7.9 oz) (!) 140.2 kg (309 lb 1.4 oz) (!) 141.7 kg (312 lb 6.3 oz)    General appearance: Alert. In no distress. Resp: Clear to auscultation bilaterally. Cardio: S1, S2 is normal, regular. No S3, S4. No rubs, murmurs, or bruit. GI: Abdomen is soft. Remains tender in the right upper quadrant without any rebound, rigidity or guarding. Percell Miller sign is equivocal. No masses or organomegaly.  Bowel sounds are present.  Extremities: Minimal edema  Neurologic: No focal neurological deficits.  Lab Results:  Data Reviewed: I have personally reviewed following labs and imaging studies  CBC:  Recent Labs Lab 11/17/16 1149 11/18/16 2211 11/19/16 0742 11/20/16 0723 11/21/16 0551  WBC 8.9 10.9* 11.8* 11.8* 10.1  NEUTROABS  --   --  7.7  --   --   HGB 14.1 14.5 15.1 13.4 13.8  HCT 41.0 42.3 43.7 39.2 40.9  MCV 89.5 90.0 90.7 90.1 91.9  PLT 213 207 205 199 469    Basic Metabolic Panel:  Recent Labs Lab 11/17/16 1149 11/18/16 2211 11/19/16 0742 11/20/16 0723 11/21/16 0551  NA 139 131* 135 138 135  K 3.9 3.7 4.2 4.3 3.8  CL 92* 91* 88* 93* 91*  CO2 25 20* 25 22 21*  GLUCOSE 121* 106* 90 92 73  BUN 55* 21* 27* 47* 21*  CREATININE 13.93* 8.53* 9.92* 13.89* 9.25*  CALCIUM 8.2* 8.8* 8.9 8.0* 8.4*    GFR: Estimated Creatinine Clearance: 15.4 mL/min (A) (by C-G formula based on SCr of 9.25 mg/dL (H)).  Liver Function Tests:  Recent Labs Lab 11/17/16 1149 11/18/16 2211 11/19/16 0742 11/20/16 0723 11/21/16 0551  AST 17 17 15 15 20   ALT 19 19 19 17  20  ALKPHOS 64 70 73 58 54  BILITOT 0.9 1.4* 1.3* 1.1 2.0*  PROT 8.7* 9.1* 9.4* 8.2* 8.1  ALBUMIN 4.5 4.5 4.6 4.1 3.9     Recent Labs Lab 11/17/16 1149 11/18/16 2211  LIPASE 24 25     Recent Results (from the past 240 hour(s))  MRSA PCR Screening     Status: None   Collection Time: 11/19/16  4:17 PM  Result Value Ref Range Status   MRSA by PCR NEGATIVE NEGATIVE Final    Comment:        The GeneXpert MRSA Assay (FDA approved for NASAL specimens only), is one component of a comprehensive MRSA colonization surveillance program. It is not intended to diagnose MRSA infection nor to guide or monitor treatment for MRSA infections.       Radiology Studies: Nm Hepato W/eject Fract  Result Date: 11/21/2016 CLINICAL DATA:  Patient with abdominal pain. Gallstones on MRI and ultrasound. EXAM: NUCLEAR  MEDICINE HEPATOBILIARY IMAGING WITH GALLBLADDER EF TECHNIQUE: Sequential images of the abdomen were obtained out to 60 minutes following intravenous administration of radiopharmaceutical. After oral ingestion of Ensure, gallbladder ejection fraction was determined. At 60 min, normal ejection fraction is greater than 33%. RADIOPHARMACEUTICALS:  5.15 mCi Tc-72m  Choletec IV COMPARISON:  None. FINDINGS: There is homogeneous accumulation of radiotracer by the liver. Gallbladder activity is visualized, consistent with patency of cystic duct. Biliary activity passes into small bowel, consistent with patent common bile duct. Calculated gallbladder ejection fraction is twelfth%. (Normal gallbladder ejection fraction with Ensure is greater than 33%.) IMPRESSION: 1. Normal appearance of the liver. 2. Normal gallbladder filling.  No evidence of acute cholecystitis. 3. Normal small bowel filling reflecting a patent common bile duct. 4. Gallbladder dysfunction reflected by a depressed ejection fraction of 12%. Electronically Signed   By: Lajean Manes M.D.   On: 11/21/2016 14:31     Medications:  Scheduled: . calcitRIOL  0.5 mcg Oral Q M,W,F-HD  . heparin  5,000 Units Subcutaneous Q8H  . metoCLOPramide (REGLAN) injection  10 mg Intravenous Q8H  . pantoprazole (PROTONIX) IV  40 mg Intravenous Q12H   Continuous: . sodium chloride 30 mL/hr at 11/19/16 0922  . piperacillin-tazobactam (ZOSYN)  IV Stopped (11/22/16 0502)   WUJ:[id number] **OR** acetaminophen, albuterol, fentaNYL (SUBLIMAZE) injection, ketorolac, LORazepam, ondansetron (ZOFRAN) IV **OR** promethazine  Assessment/Plan:  Principal Problem:   RUQ pain Active Problems:   Morbid obesity, weight 291, BMI - 47   Lapband April 2016   CKD (chronic kidney disease) stage V requiring chronic dialysis (HCC)   Hepatic steatosis   Cholelithiasis   GERD (gastroesophageal reflux disease)   Essential hypertension   ?? Gastroparesis- NORMAL NM gastric  emptying study 2016   Gastritis   Increased anion gap metabolic acidosis   Intractable abdominal pain   Common bile duct dilatation   Cholelithiasis without cholecystitis   RUQ abdominal pain    RUQ pain/Cholelithiasis His symptoms are chronic for the most part. At the same time cholelithiasis could be contributing. He has been seen by general surgery in the past, although unclear if he has ever followed up with them in the outpatient setting. General surgery was consulted. They have seen the patient. Underwent a HIDA scan yesterday. No evidence for cholecystitis. However, there was evidence for gallbladder dysfunction with a reduced ejection fraction of 15%. Surgery has plan for laparoscopic cholecystectomy tomorrow. No other reason for his pain has been determined. He has been seen by gastroenterology previously as well. He has had gastric  banding before, but this has been reversed. Had hepatobiliary scan November 2017 with normal ejection fraction and no ductal obstruction. Continue Zosyn for now. Patient underwent upper endoscopy in January. Retained gastric fluid was noted. Also noted to have gastritis and duodenitis. Otherwise, the endoscopy was unremarkable.    Questionable Gastroparesis He had a normal gastric emptying study in 2016. Recent endoscopy did show retained fluid in the stomach. Continue Reglan for now. Change to oral once he is placed on a diet.  Gastritis/GERD (gastroesophageal reflux disease) Previous EGD revealed mild gastritis. Was not on PPI, H2 blocker or Carafate prior to admission. Biopsies from January 2018 EGD negative for H. Pylori. Protonix. Changed to oral once he is placed on a diet.  End-stage renal disease requiring chronic dialysis  MWF schedule. Nephrology is following. Being dialyzed as per his usual schedule.   Essential hypertension Not on medical therapy prior to admission  Increased anion gap metabolic acidosis Has been chronically elevated.  This is likely due to his renal failure. Patient does not have any history of diabetes.  Morbid obesity Body mass index is 50.6 kg/m.  Hepatic steatosis AST and ALT are normal.   DVT Prophylaxis: Subcutaneous heparin    Code Status: Full code  Family Communication: Discussed with the patient  Disposition Plan: Appreciate general surgery input. Laparoscopic cholecystectomy in a.m.    LOS: 2 days   [facility] Pager [phone] 11/22/2016, 9:28 AM  If 7PM-7AM, please contact night-coverage at [email or web address], password [facility]

## 2016-11-22 NOTE — Progress Notes (Signed)
Subjective:  Continued abdominal pain /Nausea  and V x1 yest.  After Ensure with  HIDA yesterday = "Gallbladder dysfunction reflected by a depressed ejection fraction of 12%."  Objective Vital signs in last 24 hours: Vitals:   11/20/16 2200 11/21/16 0432 11/21/16 2042 11/22/16 0450  BP: 124/61 (!) 82/60 111/61 124/70  Pulse: (!) 101 93 94 92  Resp: 18 20 20 18   Temp: 98.3 F (36.8 C) 97.9 F (36.6 C) 98.8 F (37.1 C) 98.7 F (37.1 C)  TempSrc: Oral Oral Oral Oral  SpO2: 97% 97% 100% 100%  Weight:  (!) 140.2 kg (309 lb 1.4 oz)  (!) 141.7 kg (312 lb 6.3 oz)  Height:       Weight change: 2.1 kg (4 lb 10.1 oz)  Physical Exam: General:Alert Obese AAM , NAd  Ox3 Heart: RRR no  MRG Lungs: CTA , Non labored breathing Abdomen: BS pos / obese/ Tender R Quad  Tenderness , no rebound  Extremities: Trace bipedal edema   Dialysis Access: pos bruit LFA AVF  Dialysis Orders: Center: [facility] on MWF. EDW 141 , (last 3 tx post wts= 142.2 Full Tx , 143.6 half tx time , 144.2 half tx time HD Bath 2k, 2.5 caTime 4hr 68minHeparin 12000. Access LFA AVF BFR 450 DFR 800  Calcitriol PO 0.69mcg /HD  Problem/Plan: 1. Abdominal Pain ho Cholelithiasis/ HiDA  Scan  With Depressed EF 12% and continued  abd. symptoms  - wu per admit  / [facility] plans  for lap Chole tomorrow 2. ESRD -  HD MWF on schedule / K = 3.8 yesterday / next  HD Monday 4/23 early am  3. Hypertension/volume  - this am  Stable 124/70  / no bp meds as op / below edw  With little po intake this admit / no uf with hd in am  4. Anemia  Of ESRD  No esa needs as OP /hgb 13.8 yest .   5. Metabolic bone disease -  PO vit d on hd  Will change to IV Hectorol  With GI symp/ surg .  Convert back to po q hd when stable / Renvela as binder when eating  6. Morbid Obesity (ho Lap band  12/2014 removed sec to discomfort )  Alex Haber, [name] [place] [phone] 11/22/2016,11:54 AM  LOS: 2 days   Pt seen, examined and agree  w A/P as above. For lap chole tomorrow, at dry wt, no renal issues.  Plan HD tomorrow, sched around surgery. If necessary could be bumped to Tuesday.  Kelly Splinter MD [facility] pager [phone]   11/22/2016, 2:15 PM    Labs: Basic Metabolic Panel:  Recent Labs Lab 11/19/16 0742 11/20/16 0723 11/21/16 0551  NA 135 138 135  K 4.2 4.3 3.8  CL 88* 93* 91*  CO2 25 22 21*  GLUCOSE 90 92 73  BUN 27* 47* 21*  CREATININE 9.92* 13.89* 9.25*  CALCIUM 8.9 8.0* 8.4*   Liver Function Tests:  Recent Labs Lab 11/19/16 0742 11/20/16 0723 11/21/16 0551  AST 15 15 20   ALT 19 17 20   ALKPHOS 73 58 54  BILITOT 1.3* 1.1 2.0*  PROT 9.4* 8.2* 8.1  ALBUMIN 4.6 4.1 3.9    Recent Labs Lab 11/17/16 1149 11/18/16 2211  LIPASE 24 25   CBC:  Recent Labs Lab 11/17/16 1149 11/18/16 2211 11/19/16 0742 11/20/16 0723 11/21/16 0551  WBC 8.9 10.9* 11.8* 11.8* 10.1  NEUTROABS  --   --  7.7  --   --  HGB 14.1 14.5 15.1 13.4 13.8  HCT 41.0 42.3 43.7 39.2 40.9  MCV 89.5 90.0 90.7 90.1 91.9  PLT 213 207 205 199 165    Medications: . sodium chloride 30 mL/hr at 11/19/16 0922  . piperacillin-tazobactam (ZOSYN)  IV Stopped (11/22/16 0502)   . calcitRIOL  0.5 mcg Oral Q M,W,F-HD  . heparin  5,000 Units Subcutaneous Q8H  . metoCLOPramide (REGLAN) injection  10 mg Intravenous Q8H  . pantoprazole (PROTONIX) IV  40 mg Intravenous Q12H

## 2016-11-22 NOTE — Progress Notes (Signed)
[facility] Progress Note     Subjective: CC: abdominal pain and nausea  Pt woke with abdominal pain that was relieved with medication. Had nausea and one episode of emesis after drinking ensure at the HIDA scan. No nausea or vomiting since. No fevers.   Objective: Vital signs in last 24 hours: Temp:  [98.7 F (37.1 C)-98.8 F (37.1 C)] 98.7 F (37.1 C) (04/22 0450) Pulse Rate:  [92-94] 92 (04/22 0450) Resp:  [18-20] 18 (04/22 0450) BP: (111-124)/(61-70) 124/70 (04/22 0450) SpO2:  [100 %] 100 % (04/22 0450) Weight:  [312 lb 6.3 oz (141.7 kg)] 312 lb 6.3 oz (141.7 kg) (04/22 0450) Last BM Date: 11/20/16  Intake/Output from previous day: 04/21 0701 - 04/22 0700 In: 787 [I.V.:632; IV Piggyback:155] Out: 0  Intake/Output this shift: No intake/output data recorded.  PE: Gen:  Alert, NAD, pleasant, cooperative, obese lying in bed Card:  RRR, no M/G/R heard Pulm:  rate and effort normal Abd: Soft, obese, not distended, +BS, mild generalized TTP with worse tenderness in the RUQ and epigastric region Skin: no rashes noted, warm and dry  Lab Results:   Recent Labs  11/20/16 0723 11/21/16 0551  WBC 11.8* 10.1  HGB 13.4 13.8  HCT 39.2 40.9  PLT 199 165   BMET  Recent Labs  11/20/16 0723 11/21/16 0551  NA 138 135  K 4.3 3.8  CL 93* 91*  CO2 22 21*  GLUCOSE 92 73  BUN 47* 21*  CREATININE 13.89* 9.25*  CALCIUM 8.0* 8.4*   PT/INR No results for input(s): LABPROT, INR in the last 72 hours. CMP     Component Value Date/Time   NA 135 11/21/2016 0551   K 3.8 11/21/2016 0551   CL 91 (L) 11/21/2016 0551   CO2 21 (L) 11/21/2016 0551   GLUCOSE 73 11/21/2016 0551   BUN 21 (H) 11/21/2016 0551   CREATININE 9.25 (H) 11/21/2016 0551   CALCIUM 8.4 (L) 11/21/2016 0551   PROT 8.1 11/21/2016 0551   ALBUMIN 3.9 11/21/2016 0551   AST 20 11/21/2016 0551   ALT 20 11/21/2016 0551   ALKPHOS 54 11/21/2016 0551   BILITOT 2.0 (H) 11/21/2016 0551   GFRNONAA 7 (L)  11/21/2016 0551   GFRAA 8 (L) 11/21/2016 0551   Lipase     Component Value Date/Time   LIPASE 25 11/18/2016 2211       Studies/Results: Nm Hepato W/eject Fract  Result Date: 11/21/2016 CLINICAL DATA:  Patient with abdominal pain. Gallstones on MRI and ultrasound. EXAM: NUCLEAR MEDICINE HEPATOBILIARY IMAGING WITH GALLBLADDER EF TECHNIQUE: Sequential images of the abdomen were obtained out to 60 minutes following intravenous administration of radiopharmaceutical. After oral ingestion of Ensure, gallbladder ejection fraction was determined. At 60 min, normal ejection fraction is greater than 33%. RADIOPHARMACEUTICALS:  5.15 mCi Tc-8m  Choletec IV COMPARISON:  None. FINDINGS: There is homogeneous accumulation of radiotracer by the liver. Gallbladder activity is visualized, consistent with patency of cystic duct. Biliary activity passes into small bowel, consistent with patent common bile duct. Calculated gallbladder ejection fraction is twelfth%. (Normal gallbladder ejection fraction with Ensure is greater than 33%.) IMPRESSION: 1. Normal appearance of the liver. 2. Normal gallbladder filling.  No evidence of acute cholecystitis. 3. Normal small bowel filling reflecting a patent common bile duct. 4. Gallbladder dysfunction reflected by a depressed ejection fraction of 12%. Electronically Signed   By: Lajean Manes M.D.   On: 11/21/2016 14:31    Anti-infectives: Anti-infectives    Start  Dose/Rate Route Frequency Ordered Stop   11/19/16 1300  piperacillin-tazobactam (ZOSYN) IVPB 3.375 g     3.375 g 12.5 mL/hr over 240 Minutes Intravenous Every 12 hours 11/19/16 1236         Assessment/Plan Chronic abdominal pain Intractable vomiting  - S/P lap band removal 12/2014 Dr. Alphonsa Overall - previous Dx of gastroparesis based on sxs however gastric emptying study 2016 was normal  - last EGD by Dr. Hilarie Fredrickson 08/2016 w/ normal esophagus and gastric cardia/fundus, retained fluid in gastric body,  gastritis and duodenitis(BX negative for malignancy),and normal 2nd portion of duodenum.  Cholelithiasis - 11/19/16 RUQ U/S negative for cholecystitis, MRCP negative for CBD obstruction/mass, CBD 6.0 mm  - Previous HIDA 06/2016 revealed patent cystic duct with borderline EF of 39% (stable compared to previous HIDA in 2007) - HIDA yesterday showed Normal gallbladder filling.  No evidence of acute cholecystitis. Normal small bowel filling reflecting a patent common bile duct. Gallbladder dysfunction reflected by a depressed ejection fraction of 12%.  HTN ESRD on HD GERD Hepatic steatosis  Morbid obesity   Plan: Low suspicion for cholecystitis, likely symptomatic cholelithiasis and gallbladder dysfunction causing symptoms. Symptoms could also be 2/2 impaired GI motility. Will plan for lap chole this admission likely tomorrow. Clears for now and NPO at midnight.   While it sounds like the patient has already received previous GI work-up in the past, patient would likely benefit from outpatient GI follow up at discharge.    LOS: 2 days    Kalman Drape , [facility] 11/22/2016, 9:10 AM Pager: [phone] Consults: [phone] Mon-Fri 7:00 am-4:30 pm Sat-Sun 7:00 am-11:30 am

## 2016-11-23 ENCOUNTER — Inpatient Hospital Stay (HOSPITAL_COMMUNITY): Payer: Medicare Other | Admitting: Certified Registered Nurse Anesthetist

## 2016-11-23 ENCOUNTER — Encounter (HOSPITAL_COMMUNITY)
Admission: EM | Disposition: A | Discharge status: Home / Self Care | Payer: Self-pay | Source: Home / Self Care | Attending: Internal Medicine

## 2016-11-23 ENCOUNTER — Inpatient Hospital Stay (HOSPITAL_COMMUNITY): Payer: Medicare Other

## 2016-11-23 ENCOUNTER — Encounter (HOSPITAL_COMMUNITY): Payer: Self-pay | Admitting: Anesthesiology

## 2016-11-23 DIAGNOSIS — E872 Acidosis: Secondary | ICD-10-CM

## 2016-11-23 HISTORY — PX: CHOLECYSTECTOMY: SHX55

## 2016-11-23 LAB — CBC
HCT: 37.2 % — ABNORMAL LOW (ref 39.0–52.0)
Hemoglobin: 12.6 g/dL — ABNORMAL LOW (ref 13.0–17.0)
MCH: 30.1 pg (ref 26.0–34.0)
MCHC: 33.9 g/dL (ref 30.0–36.0)
MCV: 89 fL (ref 78.0–100.0)
PLATELETS: 142 10*3/uL — AB (ref 150–400)
RBC: 4.18 MIL/uL — AB (ref 4.22–5.81)
RDW: 13.9 % (ref 11.5–15.5)
WBC: 8.7 10*3/uL (ref 4.0–10.5)

## 2016-11-23 LAB — BASIC METABOLIC PANEL
Anion gap: 24 — ABNORMAL HIGH (ref 5–15)
BUN: 43 mg/dL — ABNORMAL HIGH (ref 6–20)
CALCIUM: 7.2 mg/dL — AB (ref 8.9–10.3)
CO2: 19 mmol/L — ABNORMAL LOW (ref 22–32)
Chloride: 92 mmol/L — ABNORMAL LOW (ref 101–111)
Creatinine, Ser: 14.79 mg/dL — ABNORMAL HIGH (ref 0.61–1.24)
GFR, EST AFRICAN AMERICAN: 4 mL/min — AB (ref >60)
GFR, EST NON AFRICAN AMERICAN: 4 mL/min — AB (ref >60)
Glucose, Bld: 93 mg/dL (ref 65–99)
Potassium: 4.2 mmol/L (ref 3.5–5.1)
SODIUM: 135 mmol/L (ref 135–145)

## 2016-11-23 SURGERY — LAPAROSCOPIC CHOLECYSTECTOMY WITH INTRAOPERATIVE CHOLANGIOGRAM
Anesthesia: General | Site: Abdomen

## 2016-11-23 MED ORDER — GLYCOPYRROLATE 0.2 MG/ML IJ SOLN
INTRAMUSCULAR | Status: DC | PRN
  Administered 2016-11-23: 0.4 mg via INTRAVENOUS

## 2016-11-23 MED ORDER — FENTANYL CITRATE (PF) 250 MCG/5ML IJ SOLN
INTRAMUSCULAR | Status: AC
  Filled 2016-11-23: qty 5

## 2016-11-23 MED ORDER — LIDOCAINE 2% (20 MG/ML) 5 ML SYRINGE
INTRAMUSCULAR | Status: AC
  Filled 2016-11-23: qty 5

## 2016-11-23 MED ORDER — 0.9 % SODIUM CHLORIDE (POUR BTL) OPTIME
TOPICAL | Status: DC | PRN
  Administered 2016-11-23: 1000 mL

## 2016-11-23 MED ORDER — BUPIVACAINE HCL (PF) 0.25 % IJ SOLN
INTRAMUSCULAR | Status: DC | PRN
  Administered 2016-11-23: 30 mL

## 2016-11-23 MED ORDER — DEXTROSE 5 % IV SOLN
3.0000 g | INTRAVENOUS | Status: AC
  Administered 2016-11-23: 3 g via INTRAVENOUS
  Filled 2016-11-23: qty 3000

## 2016-11-23 MED ORDER — IOPAMIDOL (ISOVUE-300) INJECTION 61%
INTRAVENOUS | Status: DC | PRN
Start: 2016-11-23 — End: 2016-11-23
  Administered 2016-11-23: 100 mL

## 2016-11-23 MED ORDER — BUPIVACAINE HCL (PF) 0.25 % IJ SOLN
INTRAMUSCULAR | Status: AC
  Filled 2016-11-23: qty 30

## 2016-11-23 MED ORDER — CHLORHEXIDINE GLUCONATE CLOTH 2 % EX PADS: 6.0000 | MEDICATED_PAD | Freq: Once | CUTANEOUS | Status: DC

## 2016-11-23 MED ORDER — SODIUM CHLORIDE 0.9 % IV SOLN
INTRAVENOUS | Status: DC
  Administered 2016-11-23 (×2): via INTRAVENOUS

## 2016-11-23 MED ORDER — SODIUM CHLORIDE 0.9 % IR SOLN
Status: DC | PRN
  Administered 2016-11-23 (×2): 1000 mL

## 2016-11-23 MED ORDER — NEOSTIGMINE METHYLSULFATE 5 MG/5ML IV SOSY
PREFILLED_SYRINGE | INTRAVENOUS | Status: AC
  Filled 2016-11-23: qty 5

## 2016-11-23 MED ORDER — CISATRACURIUM BESYLATE (PF) 10 MG/5ML IV SOLN
INTRAVENOUS | Status: DC | PRN
  Administered 2016-11-23: 4 mg via INTRAVENOUS
  Administered 2016-11-23: 10 mg via INTRAVENOUS

## 2016-11-23 MED ORDER — FENTANYL CITRATE (PF) 100 MCG/2ML IJ SOLN
25.0000 ug | INTRAMUSCULAR | Status: DC | PRN
  Administered 2016-11-23: 50 ug via INTRAVENOUS

## 2016-11-23 MED ORDER — NEOSTIGMINE METHYLSULFATE 10 MG/10ML IV SOLN
INTRAVENOUS | Status: DC | PRN
  Administered 2016-11-23: 4 mg via INTRAVENOUS
  Administered 2016-11-23: 1 mg via INTRAVENOUS

## 2016-11-23 MED ORDER — FENTANYL CITRATE (PF) 100 MCG/2ML IJ SOLN
INTRAMUSCULAR | Status: AC
  Filled 2016-11-23: qty 2

## 2016-11-23 MED ORDER — SUCCINYLCHOLINE CHLORIDE 200 MG/10ML IV SOSY
PREFILLED_SYRINGE | INTRAVENOUS | Status: DC | PRN
  Administered 2016-11-23: 140 mg via INTRAVENOUS

## 2016-11-23 MED ORDER — OXYCODONE-ACETAMINOPHEN 5-325 MG PO TABS
1.0000 | ORAL_TABLET | ORAL | Status: DC | PRN
  Administered 2016-11-23: 2 via ORAL
  Filled 2016-11-23 (×2): qty 2

## 2016-11-23 MED ORDER — CISATRACURIUM BESYLATE 20 MG/10ML IV SOLN
INTRAVENOUS | Status: AC
  Filled 2016-11-23: qty 10

## 2016-11-23 MED ORDER — DOXERCALCIFEROL 4 MCG/2ML IV SOLN
INTRAVENOUS | Status: AC
  Administered 2016-11-23: 2 ug via INTRAVENOUS
  Filled 2016-11-23: qty 2

## 2016-11-23 MED ORDER — STERILE WATER FOR IRRIGATION IR SOLN
Status: DC | PRN
Start: 2016-11-23 — End: 2016-11-23
  Administered 2016-11-23: 400 mL

## 2016-11-23 MED ORDER — IOPAMIDOL (ISOVUE-300) INJECTION 61%
INTRAVENOUS | Status: AC
  Filled 2016-11-23: qty 50

## 2016-11-23 MED ORDER — ONDANSETRON HCL 4 MG/2ML IJ SOLN
INTRAMUSCULAR | Status: AC
  Filled 2016-11-23: qty 2

## 2016-11-23 MED ORDER — FENTANYL CITRATE (PF) 100 MCG/2ML IJ SOLN
INTRAMUSCULAR | Status: DC | PRN
  Administered 2016-11-23 (×2): 50 ug via INTRAVENOUS
  Administered 2016-11-23: 150 ug via INTRAVENOUS

## 2016-11-23 MED ORDER — LIDOCAINE HCL (CARDIAC) 20 MG/ML IV SOLN
INTRAVENOUS | Status: DC | PRN
  Administered 2016-11-23: 100 mg via INTRAVENOUS

## 2016-11-23 MED ORDER — PROPOFOL 10 MG/ML IV BOLUS
INTRAVENOUS | Status: AC
  Filled 2016-11-23: qty 20

## 2016-11-23 MED ORDER — PROPOFOL 10 MG/ML IV BOLUS
INTRAVENOUS | Status: DC | PRN
  Administered 2016-11-23: 170 mg via INTRAVENOUS

## 2016-11-23 MED ORDER — ONDANSETRON HCL 4 MG/2ML IJ SOLN
INTRAMUSCULAR | Status: DC | PRN
  Administered 2016-11-23: 4 mg via INTRAVENOUS

## 2016-11-23 SURGICAL SUPPLY — 43 items
APPLIER CLIP ROT 10 11.4 M/L (STAPLE) ×2
BLADE CLIPPER SURG (BLADE) ×2 IMPLANT
CANISTER SUCT 3000ML PPV (MISCELLANEOUS) ×2 IMPLANT
CHLORAPREP W/TINT 26ML (MISCELLANEOUS) ×2 IMPLANT
CLIP APPLIE ROT 10 11.4 M/L (STAPLE) ×1 IMPLANT
COVER MAYO STAND STRL (DRAPES) ×2 IMPLANT
COVER SURGICAL LIGHT HANDLE (MISCELLANEOUS) ×2 IMPLANT
DERMABOND ADVANCED (GAUZE/BANDAGES/DRESSINGS) ×2
DERMABOND ADVANCED .7 DNX12 (GAUZE/BANDAGES/DRESSINGS) ×2 IMPLANT
DRAPE C-ARM 42X72 X-RAY (DRAPES) ×2 IMPLANT
DRAPE WARM FLUID 44X44 (DRAPE) ×2 IMPLANT
ELECT REM PT RETURN 9FT ADLT (ELECTROSURGICAL) ×2
ELECTRODE REM PT RTRN 9FT ADLT (ELECTROSURGICAL) ×1 IMPLANT
GLOVE BIO SURGEON STRL SZ8 (GLOVE) ×2 IMPLANT
GLOVE BIOGEL PI IND STRL 6.5 (GLOVE) ×1 IMPLANT
GLOVE BIOGEL PI IND STRL 8 (GLOVE) ×2 IMPLANT
GLOVE BIOGEL PI INDICATOR 6.5 (GLOVE) ×1
GLOVE BIOGEL PI INDICATOR 8 (GLOVE) ×2
GLOVE INDICATOR 7.5 STRL GRN (GLOVE) ×4 IMPLANT
GLOVE SURG SS PI 6.5 STRL IVOR (GLOVE) ×2 IMPLANT
GLOVE SURG SS PI 7.5 STRL IVOR (GLOVE) ×2 IMPLANT
GOWN STRL REUS W/ TWL LRG LVL3 (GOWN DISPOSABLE) ×3 IMPLANT
GOWN STRL REUS W/ TWL XL LVL3 (GOWN DISPOSABLE) ×1 IMPLANT
GOWN STRL REUS W/TWL LRG LVL3 (GOWN DISPOSABLE) ×3
GOWN STRL REUS W/TWL XL LVL3 (GOWN DISPOSABLE) ×1
KIT BASIN OR (CUSTOM PROCEDURE TRAY) ×2 IMPLANT
KIT ROOM TURNOVER OR (KITS) ×2 IMPLANT
NS IRRIG 1000ML POUR BTL (IV SOLUTION) ×2 IMPLANT
PAD ARMBOARD 7.5X6 YLW CONV (MISCELLANEOUS) ×2 IMPLANT
POUCH SPECIMEN RETRIEVAL 10MM (ENDOMECHANICALS) ×2 IMPLANT
SCISSORS LAP 5X35 DISP (ENDOMECHANICALS) ×2 IMPLANT
SET CHOLANGIOGRAPH 5 50 .035 (SET/KITS/TRAYS/PACK) ×2 IMPLANT
SET IRRIG TUBING LAPAROSCOPIC (IRRIGATION / IRRIGATOR) ×2 IMPLANT
SLEEVE ENDOPATH XCEL 5M (ENDOMECHANICALS) ×2 IMPLANT
SPECIMEN JAR SMALL (MISCELLANEOUS) ×2 IMPLANT
SUT MNCRL AB 4-0 PS2 18 (SUTURE) ×4 IMPLANT
TOWEL OR 17X24 6PK STRL BLUE (TOWEL DISPOSABLE) ×2 IMPLANT
TOWEL OR 17X26 10 PK STRL BLUE (TOWEL DISPOSABLE) ×2 IMPLANT
TRAY LAPAROSCOPIC MC (CUSTOM PROCEDURE TRAY) ×2 IMPLANT
TROCAR XCEL BLUNT TIP 100MML (ENDOMECHANICALS) ×2 IMPLANT
TROCAR XCEL NON-BLD 11X100MML (ENDOMECHANICALS) ×2 IMPLANT
TROCAR XCEL NON-BLD 5MMX100MML (ENDOMECHANICALS) ×2 IMPLANT
TUBING INSUFFLATION (TUBING) ×2 IMPLANT

## 2016-11-23 NOTE — Progress Notes (Signed)
[facility] PROGRESS NOTE  [name] GBT:[id number] DOB: 08-Oct-1985 DOA: 11/19/2016  PCP: Philis Fendt, MD  Brief History/Interval Summary: 31 year old African-American male with a past medical history of end-stage renal disease on hemodialysis, history of gastroparesis, known cholelithiasis, hypertension, presented with complaints of abdominal pain, nausea and vomiting. Patient has been evaluated for similar complaints in the past. He was hospitalized for further management.  Reason for Visit: Abdominal pain in the setting of cholelithiasis  Consultants: Nephrology. General surgery.  Procedures: Hemodialysis  Antibiotics: none  Subjective/Interval History: Patient feels well. Continues to have pain and discomfort in his right upper abdomen. No nausea or vomiting.    ROS: Denies any chest pain  Objective:  Vital Signs  Vitals:   11/23/16 1030 11/23/16 1100 11/23/16 1130 11/23/16 1200  BP: (!) 81/36 (!) 94/51 (!) 98/47 (!) 89/45  Pulse: 72 78 72 69  Resp:      Temp:      TempSrc:      SpO2:      Weight:      Height:        Intake/Output Summary (Last 24 hours) at 11/23/16 1206 Last data filed at 11/23/16 0506  Gross per 24 hour  Intake            807.5 ml  Output                0 ml  Net            807.5 ml   Filed Weights   11/22/16 2030 11/23/16 0500 11/23/16 0756  Weight: (!) 142.4 kg (313 lb 15 oz) (!) 142.3 kg (313 lb 11.4 oz) (!) 141.2 kg (311 lb 4.6 oz)    General appearance: Alert. No distress. Resp: Clear to auscultation bilaterally. No wheezing, rales or rhonchi. Cardio: S1, S2 is normal, regular. No S3, S4. No rubs, murmurs, or bruit. No pedal edema. GI: Abdomen is obese but soft. Remains tender in the right upper quadrant without any rebound, rigidity or guarding. No masses or organomegaly. Bowel sounds present.  Neurologic: No focal neurological deficits.  Lab Results:  Data Reviewed: I have personally reviewed following labs and  imaging studies  CBC:  Recent Labs Lab 11/18/16 2211 11/19/16 0742 11/20/16 0723 11/21/16 0551 11/23/16 0636  WBC 10.9* 11.8* 11.8* 10.1 8.7  NEUTROABS  --  7.7  --   --   --   HGB 14.5 15.1 13.4 13.8 12.6*  HCT 42.3 43.7 39.2 40.9 37.2*  MCV 90.0 90.7 90.1 91.9 89.0  PLT 207 205 199 165 142*    Basic Metabolic Panel:  Recent Labs Lab 11/18/16 2211 11/19/16 0742 11/20/16 0723 11/21/16 0551 11/23/16 0636  NA 131* 135 138 135 135  K 3.7 4.2 4.3 3.8 4.2  CL 91* 88* 93* 91* 92*  CO2 20* 25 22 21* 19*  GLUCOSE 106* 90 92 73 93  BUN 21* 27* 47* 21* 43*  CREATININE 8.53* 9.92* 13.89* 9.25* 14.79*  CALCIUM 8.8* 8.9 8.0* 8.4* 7.2*    GFR: Estimated Creatinine Clearance: 9.6 mL/min (A) (by C-G formula based on SCr of 14.79 mg/dL (H)).  Liver Function Tests:  Recent Labs Lab 11/17/16 1149 11/18/16 2211 11/19/16 0742 11/20/16 0723 11/21/16 0551  AST 17 17 15 15 20   ALT 19 19 19 17 20   ALKPHOS 64 70 73 58 54  BILITOT 0.9 1.4* 1.3* 1.1 2.0*  PROT 8.7* 9.1* 9.4* 8.2* 8.1  ALBUMIN 4.5 4.5 4.6 4.1 3.9  Recent Labs Lab 11/17/16 1149 11/18/16 2211  LIPASE 24 25     Recent Results (from the past 240 hour(s))  MRSA PCR Screening     Status: None   Collection Time: 11/19/16  4:17 PM  Result Value Ref Range Status   MRSA by PCR NEGATIVE NEGATIVE Final    Comment:        The GeneXpert MRSA Assay (FDA approved for NASAL specimens only), is one component of a comprehensive MRSA colonization surveillance program. It is not intended to diagnose MRSA infection nor to guide or monitor treatment for MRSA infections.       Radiology Studies: Nm Hepato W/eject Fract  Result Date: 11/21/2016 CLINICAL DATA:  Patient with abdominal pain. Gallstones on MRI and ultrasound. EXAM: NUCLEAR MEDICINE HEPATOBILIARY IMAGING WITH GALLBLADDER EF TECHNIQUE: Sequential images of the abdomen were obtained out to 60 minutes following intravenous administration of  radiopharmaceutical. After oral ingestion of Ensure, gallbladder ejection fraction was determined. At 60 min, normal ejection fraction is greater than 33%. RADIOPHARMACEUTICALS:  5.15 mCi Tc-69m  Choletec IV COMPARISON:  None. FINDINGS: There is homogeneous accumulation of radiotracer by the liver. Gallbladder activity is visualized, consistent with patency of cystic duct. Biliary activity passes into small bowel, consistent with patent common bile duct. Calculated gallbladder ejection fraction is twelfth%. (Normal gallbladder ejection fraction with Ensure is greater than 33%.) IMPRESSION: 1. Normal appearance of the liver. 2. Normal gallbladder filling.  No evidence of acute cholecystitis. 3. Normal small bowel filling reflecting a patent common bile duct. 4. Gallbladder dysfunction reflected by a depressed ejection fraction of 12%. Electronically Signed   By: Lajean Manes M.D.   On: 11/21/2016 14:31     Medications:  Scheduled: . doxercalciferol  2 mcg Intravenous Q M,W,F-HD  . metoCLOPramide (REGLAN) injection  10 mg Intravenous Q8H  . pantoprazole (PROTONIX) IV  40 mg Intravenous Q12H   Continuous: . sodium chloride 30 mL/hr at 11/19/16 0922  . piperacillin-tazobactam (ZOSYN)  IV Stopped (11/23/16 0506)   DPO:[id number] **OR** acetaminophen, albuterol, fentaNYL (SUBLIMAZE) injection, ketorolac, LORazepam, ondansetron (ZOFRAN) IV **OR** promethazine  Assessment/Plan:  Principal Problem:   RUQ pain Active Problems:   Morbid obesity, weight 291, BMI - 47   Lapband April 2016   CKD (chronic kidney disease) stage V requiring chronic dialysis (HCC)   Hepatic steatosis   Cholelithiasis   GERD (gastroesophageal reflux disease)   Essential hypertension   ?? Gastroparesis- NORMAL NM gastric emptying study 2016   Gastritis   Increased anion gap metabolic acidosis   Intractable abdominal pain   Common bile duct dilatation   Cholelithiasis without cholecystitis   RUQ abdominal  pain    RUQ pain/Cholelithiasis His symptoms are chronic for the most part. At the same time cholelithiasis could be contributing. He has been seen by general surgery in the past, although unclear if he has ever followed up with them in the outpatient setting. General surgery was consulted. They have seen the patient. Underwent a HIDA scan 4/21. No evidence for cholecystitis. However, there was evidence for gallbladder dysfunction with a reduced ejection fraction of 15%. Surgery has plan for laparoscopic cholecystectomy today. No other reason for his pain has been determined. He has been seen by gastroenterology previously as well. He has had gastric banding before, but this has been reversed. Had hepatobiliary scan November 2017 with normal ejection fraction and no ductal obstruction. Continue Zosyn for now. Patient underwent upper endoscopy in January. Retained gastric fluid was noted. Also noted to have  gastritis and duodenitis. Otherwise, the endoscopy was unremarkable.    Questionable Gastroparesis He had a normal gastric emptying study in 2016. Recent endoscopy did show retained fluid in the stomach. Continue Reglan for now. Change to oral once he is placed on a diet.  Gastritis/GERD (gastroesophageal reflux disease) Previous EGD revealed mild gastritis. Was not on PPI, H2 blocker or Carafate prior to admission. Biopsies from January 2018 EGD negative for H. Pylori. Continue Protonix. Changed to oral once he is placed on a diet.  End-stage renal disease requiring chronic dialysis  MWF schedule. Nephrology is following. Being dialyzed as per his usual schedule.   Essential hypertension Not on medical therapy prior to admission  Increased anion gap metabolic acidosis Has been chronically elevated. This is likely due to his renal failure. Patient does not have any history of diabetes.  Morbid obesity Body mass index is 50.6 kg/m.  Hepatic steatosis AST and ALT are normal.   DVT  Prophylaxis: Subcutaneous heparin    Code Status: Full code  Family Communication: Discussed with the patient  Disposition Plan: Surgery later today.    LOS: 3 days   [facility] Pager [phone] 11/23/2016, 12:06 PM  If 7PM-7AM, please contact night-coverage at [email or web address], password [facility]

## 2016-11-23 NOTE — Progress Notes (Signed)
Nurse from [place] here to get pt cell phone.

## 2016-11-23 NOTE — Progress Notes (Signed)
Pt with cell phone [place] called spoke with [place] informed her someone will need to come and get his phone.

## 2016-11-23 NOTE — Progress Notes (Signed)
Pt cellphone found on front nurses desk, returned to pt.

## 2016-11-23 NOTE — Progress Notes (Signed)
Patient ID: [name], male   DOB: 1986-04-16, 31 y.o.   MRN: [id number]  [facility] Progress Note   Assessment/ Plan:   1. Abdominal pain/nausea/vomiting: with GB dyskinesia- plan for cholecystectomy today 2. ESRD: continue MWF HD- no volume excess noted, electrolytes safe for surgery later today 3. Anemia:Hgb at goal -- not on ESA 4. CKD-MBD:currently NPO for surgery, on VDRA for PTH supression 5. Nutrition:currently NPO for surgery 6. Hypertension: BP well controlled, will monitor  Subjective:   Complains of some RUQ pain but otherwise comfortable on dialysis   Objective:   BP (!) 90/50   Pulse 81   Temp 97.5 F (36.4 C) (Oral)   Resp 17   Ht 5\' 6"  (1.676 m)   Wt (!) 141.2 kg (311 lb 4.6 oz) Comment: stood to scale   SpO2 100%   BMI 50.24 kg/m   Physical Exam: DGL:OVFIEPPIRJJ resting in dialysis OAC:ZYSAY regular rhythm, normal rate Resp:clear bilaterally, no rales/rhonchi TKZ:SWFU, obese, RUQ tenderness with guarding Ext:No LE edema noted  Labs: BMET  Recent Labs Lab 11/17/16 1149 11/18/16 2211 11/19/16 0742 11/20/16 0723 11/21/16 0551 11/23/16 0636  NA 139 131* 135 138 135 135  K 3.9 3.7 4.2 4.3 3.8 4.2  CL 92* 91* 88* 93* 91* 92*  CO2 25 20* 25 22 21* 19*  GLUCOSE 121* 106* 90 92 73 93  BUN 55* 21* 27* 47* 21* 43*  CREATININE 13.93* 8.53* 9.92* 13.89* 9.25* 14.79*  CALCIUM 8.2* 8.8* 8.9 8.0* 8.4* 7.2*   CBC  Recent Labs Lab 11/19/16 0742 11/20/16 0723 11/21/16 0551 11/23/16 0636  WBC 11.8* 11.8* 10.1 8.7  NEUTROABS 7.7  --   --   --   HGB 15.1 13.4 13.8 12.6*  HCT 43.7 39.2 40.9 37.2*  MCV 90.7 90.1 91.9 89.0  PLT 205 199 165 142*   Medications:    . doxercalciferol  2 mcg Intravenous Q M,W,F-HD  . metoCLOPramide (REGLAN) injection  10 mg Intravenous Q8H  . pantoprazole (PROTONIX) IV  40 mg Intravenous Q12H   Elmarie Shiley, MD 11/23/2016, 9:30 AM

## 2016-11-23 NOTE — Progress Notes (Signed)
Assessment completed as documented.  Pt NPO since midnight for scheduled procedure this afternoon.  Off unit at this time to Dialysis.  Pt denied any needs prior to departure.

## 2016-11-23 NOTE — Progress Notes (Signed)
Day of Surgery  Subjective: Pt done with HD  Objective: Vital signs in last 24 hours: Temp:  [97.5 F (36.4 C)-98.4 F (36.9 C)] 97.7 F (36.5 C) (04/23 1215) Pulse Rate:  [69-90] 78 (04/23 1215) Resp:  [17] 17 (04/23 0538) BP: (78-114)/(36-59) 102/48 (04/23 1215) SpO2:  [98 %-100 %] 100 % (04/23 1215) Weight:  [141.2 kg (311 lb 4.6 oz)-142.4 kg (313 lb 15 oz)] 141.2 kg (311 lb 4.6 oz) (04/23 1215) Last BM Date: 11/20/16  Intake/Output from previous day: 04/22 0701 - 04/23 0700 In: 807.5 [P.O.:30; I.V.:677.5; IV Piggyback:100] Out: 0  Intake/Output this shift: No intake/output data recorded.  GI: OBESE SOFT nt  Lab Results:   Recent Labs  11/21/16 0551 11/23/16 0636  WBC 10.1 8.7  HGB 13.8 12.6*  HCT 40.9 37.2*  PLT 165 142*   BMET  Recent Labs  11/21/16 0551 11/23/16 0636  NA 135 135  K 3.8 4.2  CL 91* 92*  CO2 21* 19*  GLUCOSE 73 93  BUN 21* 43*  CREATININE 9.25* 14.79*  CALCIUM 8.4* 7.2*   PT/INR No results for input(s): LABPROT, INR in the last 72 hours. ABG No results for input(s): PHART, HCO3 in the last 72 hours.  Invalid input(s): PCO2, PO2  Studies/Results: Nm Hepato W/eject Fract  Result Date: 11/21/2016 CLINICAL DATA:  Patient with abdominal pain. Gallstones on MRI and ultrasound. EXAM: NUCLEAR MEDICINE HEPATOBILIARY IMAGING WITH GALLBLADDER EF TECHNIQUE: Sequential images of the abdomen were obtained out to 60 minutes following intravenous administration of radiopharmaceutical. After oral ingestion of Ensure, gallbladder ejection fraction was determined. At 60 min, normal ejection fraction is greater than 33%. RADIOPHARMACEUTICALS:  5.15 mCi Tc-63m  Choletec IV COMPARISON:  None. FINDINGS: There is homogeneous accumulation of radiotracer by the liver. Gallbladder activity is visualized, consistent with patency of cystic duct. Biliary activity passes into small bowel, consistent with patent common bile duct. Calculated gallbladder ejection  fraction is twelfth%. (Normal gallbladder ejection fraction with Ensure is greater than 33%.) IMPRESSION: 1. Normal appearance of the liver. 2. Normal gallbladder filling.  No evidence of acute cholecystitis. 3. Normal small bowel filling reflecting a patent common bile duct. 4. Gallbladder dysfunction reflected by a depressed ejection fraction of 12%. Electronically Signed   By: Lajean Manes M.D.   On: 11/21/2016 14:31    Anti-infectives: Anti-infectives    Start     Dose/Rate Route Frequency Ordered Stop   11/23/16 1300  ceFAZolin (ANCEF) 3 g in dextrose 5 % 50 mL IVPB     3 g 130 mL/hr over 30 Minutes Intravenous On call to O.R. 11/23/16 1249 11/24/16 0559   11/19/16 1300  [MAR Hold]  piperacillin-tazobactam (ZOSYN) IVPB 3.375 g     (MAR Hold since 11/23/16 1245)   3.375 g 12.5 mL/hr over 240 Minutes Intravenous Every 12 hours 11/19/16 1236        Assessment/Plan:  Lap chole  IOC  The procedure has been discussed with the patient. Operative and non operative treatments have been discussed. Risks of surgery include bleeding, infection,  Common bile duct injury,  Injury to the stomach,liver, colon,small intestine, abdominal wall,  Diaphragm,  Major blood vessels,  And the need for an open procedure.  Other risks include worsening of medical problems, death,  DVT and pulmonary embolism, and cardiovascular events.   Medical options have also been discussed. The patient has been informed of long term expectations of surgery and non surgical options,  The patient agrees to proceed.  Explained the benefit of this for symptoms in 50 % at resolution.   Alternatives discussed   He wished to proceed.   LOS: 3 days    Jurnie Garritano A. 11/23/2016

## 2016-11-23 NOTE — Transfer of Care (Signed)
Immediate Anesthesia Transfer of Care Note  Patient: [name]  Procedure(s) Performed: Procedure(s): LAPAROSCOPIC CHOLECYSTECTOMY WITH INTRAOPERATIVE CHOLANGIOGRAM (N/A)  Patient Location: PACU  Anesthesia Type:General  Level of Consciousness: awake, alert , oriented and patient cooperative  Airway & Oxygen Therapy: Patient Spontanous Breathing and Patient connected to nasal cannula oxygen  Post-op Assessment: Report given to RN, Post -op Vital signs reviewed and stable and Patient moving all extremities  Post vital signs: Reviewed and stable  Last Vitals:  Vitals:   11/23/16 1215 11/23/16 1447  BP: (!) 102/48 118/68  Pulse: 78 88  Resp:  16  Temp: 36.5 C 36.7 C    Last Pain:  Vitals:   11/23/16 1447  TempSrc:   PainSc: 0-No pain      Patients Stated Pain Goal: 2 (93/73/42 8768)  Complications: No apparent anesthesia complications

## 2016-11-23 NOTE — Anesthesia Preprocedure Evaluation (Addendum)
Anesthesia Evaluation  Patient identified by MRN, date of birth, ID band Patient awake    Reviewed: Allergy & Precautions, NPO status , Patient's Chart, lab work & pertinent test results, reviewed documented beta blocker date and time   History of Anesthesia Complications (+) PONV and history of anesthetic complications  Airway Mallampati: II  TM Distance: >3 FB     Dental  (+) Teeth Intact, Dental Advisory Given   Pulmonary Current Smoker,    breath sounds clear to auscultation       Cardiovascular hypertension, Pt. on medications  Rhythm:Regular Rate:Normal     Neuro/Psych  Headaches,    GI/Hepatic hiatal hernia, GERD  ,  Endo/Other  Morbid obesity  Renal/GU Dialysis and ESRFRenal disease     Musculoskeletal  (+) Arthritis ,   Abdominal   Peds  Hematology   Anesthesia Other Findings   Reproductive/Obstetrics                            Anesthesia Physical Anesthesia Plan  ASA: III  Anesthesia Plan: General   Post-op Pain Management:    Induction: Intravenous, Rapid sequence and Cricoid pressure planned  Airway Management Planned: Oral ETT  Additional Equipment:   Intra-op Plan:   Post-operative Plan: Extubation in OR  Informed Consent: I have reviewed the patients History and Physical, chart, labs and discussed the procedure including the risks, benefits and alternatives for the proposed anesthesia with the patient or authorized representative who has indicated his/her understanding and acceptance.   Dental advisory given  Plan Discussed with: CRNA and Anesthesiologist  Anesthesia Plan Comments:        Anesthesia Quick Evaluation

## 2016-11-23 NOTE — Progress Notes (Signed)
Arrived back to unit from PACU; PACU report taken by Blanch Media, RN and given to this RN.  Pt resting quietly in no visible distress.  VSS.  Will continue to monitor.

## 2016-11-23 NOTE — Progress Notes (Addendum)
Call received from Dialysis that pt will be going straight to OR,  not back to unit prior to.  Nursing report called to OR holding; report given to [place], Therapist, sports.

## 2016-11-23 NOTE — Progress Notes (Signed)
Pt constipated last BM 4/20 need laxative paged Dr. Hilbert Bible .

## 2016-11-23 NOTE — Op Note (Signed)
Laparoscopic Cholecystectomy with IOC Procedure Note  Indications: This patient presents with symptomatic gallbladder disease and will undergo laparoscopic cholecystectomy.The procedure has been discussed with the patient. Operative and non operative treatments have been discussed. Risks of surgery include bleeding, infection,  Common bile duct injury,  Injury to the stomach,liver, colon,small intestine, abdominal wall,  Diaphragm,  Major blood vessels,  And the need for an open procedure.  Other risks include worsening of medical problems, death,  DVT and pulmonary embolism, and cardiovascular events.   Medical options have also been discussed. The patient has been informed of long term expectations of surgery and non surgical options,  The patient agrees to proceed.    Pre-operative Diagnosis: Calculus of gallbladder without mention of cholecystitis or obstruction  Post-operative Diagnosis: Same  Surgeon: Jessa Stinson A.   Assistants: Deon Pilling  RNFA  Anesthesia: General endotracheal anesthesia and Local anesthesia 0.25.% bupivacaine  ASA Class: 2  Procedure Details  The patient was seen again in the Holding Room. The risks, benefits, complications, treatment options, and expected outcomes were discussed with the patient. The possibilities of reaction to medication, pulmonary aspiration, perforation of viscus, bleeding, recurrent infection, finding a normal gallbladder, the need for additional procedures, failure to diagnose a condition, the possible need to convert to an open procedure, and creating a complication requiring transfusion or operation were discussed with the patient. The patient and/or family concurred with the proposed plan, giving informed consent. The site of surgery properly noted/marked. The patient was taken to Operating Room, identified as Lieutenant Diego and the procedure verified as Laparoscopic Cholecystectomy with Intraoperative Cholangiograms. A Time Out was held and the  above information confirmed.  Prior to the induction of general anesthesia, antibiotic prophylaxis was administered. General endotracheal anesthesia was then administered and tolerated well. After the induction, the abdomen was prepped in the usual sterile fashion. The patient was positioned in the supine position with the left arm comfortably tucked, along with some reverse Trendelenburg.  Local anesthetic agent was injected into the skin near the umbilicus and an incision made. The midline fascia was incised and the Hasson technique was used to introduce a 12 mm port under direct vision. It was secured with a figure of eight Vicryl suture placed in the usual fashion. Pneumoperitoneum was then created with CO2 and tolerated well without any adverse changes in the patient's vital signs. Additional trocars were introduced under direct vision with an 11 mm trocar in the epigastrium and TWO  5 mm trocars in the right upper quadrant. All skin incisions were infiltrated with a local anesthetic agent before making the incision and placing the trocars.   The gallbladder was identified, the fundus grasped and retracted cephalad. Adhesions were lysed bluntly and with the electrocautery where indicated, taking care not to injure any adjacent organs or viscus. The infundibulum was grasped and retracted laterally, exposing the peritoneum overlying the triangle of Calot. This was then divided and exposed in a blunt fashion. The cystic duct was clearly identified and bluntly dissected circumferentially. The junctions of the gallbladder, cystic duct and common bile duct were clearly identified prior to the division of any linear structure.   An incision was made in the cystic duct and the cholangiogram catheter introduced. The catheter was secured using an endoclip. The study showed no stones and good visualization of the distal and proximal biliary tree. Of note the patient had a cystic duct insertion on the right hepatic  duct.  The patient had a low bifurcation of CBD  into right and left hepatic ducts.  The catheter was then removed.   The cystic duct was then  ligated with surgical clips  on the patient side and  clipped on the gallbladder side and divided. The cystic artery was identified, dissected free, ligated with clips and divided as well. Posterior cystic artery clipped and divided.  The gallbladder was dissected from the liver bed in retrograde fashion with the electrocautery. The gallbladder was removed. The liver bed was irrigated and inspected. Hemostasis was achieved with the electrocautery. Copious irrigation was utilized and was repeatedly aspirated until clear all particulate matter. Hemostasis was achieved with no signs of bleeding or bile leakage.  Pneumoperitoneum was completely reduced after viewing removal of the trocars under direct vision. The wound was thoroughly irrigated and the fascia was then closed with a figure of eight suture; the skin was then closed with 4 O monocryl  and a sterile dressing was applied.  Instrument, sponge, and needle counts were correct at closure and at the conclusion of the case.   Findings: Cholecystitis with Cholelithiasis  Estimated Blood Loss: Minimal                 Total IV Fluids: per record          Specimens: Gallbladder           Complications: None; patient tolerated the procedure well.         Disposition: PACU - hemodynamically stable.         Condition: stable

## 2016-11-23 NOTE — Anesthesia Procedure Notes (Signed)
Procedure Name: Intubation Date/Time: 11/23/2016 1:22 PM Performed by: Myna Bright Pre-anesthesia Checklist: Patient identified, Emergency Drugs available, Suction available and Patient being monitored Patient Re-evaluated:Patient Re-evaluated prior to inductionOxygen Delivery Method: Circle system utilized Preoxygenation: Pre-oxygenation with 100% oxygen Intubation Type: IV induction, Rapid sequence and Cricoid Pressure applied Laryngoscope Size: Mac and 4 Grade View: Grade II Tube type: Oral Tube size: 7.5 mm Number of attempts: 1 Airway Equipment and Method: Stylet Placement Confirmation: ETT inserted through vocal cords under direct vision,  positive ETCO2 and breath sounds checked- equal and bilateral Secured at: 22 cm Tube secured with: Tape Dental Injury: Teeth and Oropharynx as per pre-operative assessment

## 2016-11-23 NOTE — Procedures (Signed)
Patient seen on Hemodialysis. QB 400, UF goal 0.5L (keeping even) Treatment adjusted as needed.  Elmarie Shiley MD [facility]. Office # [phone] Pager # [phone] 9:27 AM

## 2016-11-24 ENCOUNTER — Encounter (HOSPITAL_COMMUNITY): Payer: Self-pay | Admitting: Surgery

## 2016-11-24 LAB — CBC
HCT: 37 % — ABNORMAL LOW (ref 39.0–52.0)
Hemoglobin: 12.4 g/dL — ABNORMAL LOW (ref 13.0–17.0)
MCH: 30.1 pg (ref 26.0–34.0)
MCHC: 33.5 g/dL (ref 30.0–36.0)
MCV: 89.8 fL (ref 78.0–100.0)
PLATELETS: 154 10*3/uL (ref 150–400)
RBC: 4.12 MIL/uL — AB (ref 4.22–5.81)
RDW: 14 % (ref 11.5–15.5)
WBC: 9.6 10*3/uL (ref 4.0–10.5)

## 2016-11-24 LAB — BASIC METABOLIC PANEL
Anion gap: 20 — ABNORMAL HIGH (ref 5–15)
BUN: 18 mg/dL (ref 6–20)
CO2: 21 mmol/L — ABNORMAL LOW (ref 22–32)
Calcium: 7.6 mg/dL — ABNORMAL LOW (ref 8.9–10.3)
Chloride: 96 mmol/L — ABNORMAL LOW (ref 101–111)
Creatinine, Ser: 9.99 mg/dL — ABNORMAL HIGH (ref 0.61–1.24)
GFR, EST AFRICAN AMERICAN: 7 mL/min — AB (ref >60)
GFR, EST NON AFRICAN AMERICAN: 6 mL/min — AB (ref >60)
Glucose, Bld: 78 mg/dL (ref 65–99)
POTASSIUM: 4 mmol/L (ref 3.5–5.1)
SODIUM: 137 mmol/L (ref 135–145)

## 2016-11-24 MED ORDER — OXYCODONE HCL 5 MG PO TABS
5.0000 mg | ORAL_TABLET | ORAL | Status: DC | PRN
  Administered 2016-11-24 – 2016-11-25 (×4): 10 mg via ORAL
  Filled 2016-11-24 (×4): qty 2

## 2016-11-24 MED ORDER — METHOCARBAMOL 500 MG PO TABS: 500.0000 mg | ORAL_TABLET | Freq: Three times a day (TID) | ORAL | Status: DC | PRN

## 2016-11-24 MED ORDER — PANTOPRAZOLE SODIUM 40 MG PO TBEC
40.0000 mg | DELAYED_RELEASE_TABLET | Freq: Two times a day (BID) | ORAL | Status: DC
  Administered 2016-11-24 – 2016-11-25 (×3): 40 mg via ORAL
  Filled 2016-11-24 (×3): qty 1

## 2016-11-24 MED ORDER — METOCLOPRAMIDE HCL 5 MG PO TABS
5.0000 mg | ORAL_TABLET | Freq: Once | ORAL | Status: AC
  Administered 2016-11-24: 5 mg via ORAL

## 2016-11-24 MED ORDER — SEVELAMER CARBONATE 2.4 G PO PACK
2.4000 g | PACK | Freq: Three times a day (TID) | ORAL | Status: DC
  Administered 2016-11-24 – 2016-11-25 (×3): 2.4 g via ORAL
  Filled 2016-11-24 (×6): qty 1

## 2016-11-24 MED ORDER — ACETAMINOPHEN 325 MG PO TABS
650.0000 mg | ORAL_TABLET | Freq: Four times a day (QID) | ORAL | Status: DC
  Administered 2016-11-24 – 2016-11-25 (×3): 650 mg via ORAL
  Filled 2016-11-24 (×3): qty 2

## 2016-11-24 MED ORDER — METOCLOPRAMIDE HCL 10 MG PO TABS: 10.0000 mg | ORAL_TABLET | Freq: Three times a day (TID) | ORAL | Status: DC

## 2016-11-24 MED ORDER — METOCLOPRAMIDE HCL 5 MG PO TABS
5.0000 mg | ORAL_TABLET | Freq: Three times a day (TID) | ORAL | Status: DC
  Administered 2016-11-24 – 2016-11-25 (×3): 5 mg via ORAL
  Filled 2016-11-24 (×4): qty 1

## 2016-11-24 NOTE — Progress Notes (Signed)
Patient ID: [name], male   DOB: January 23, 1986, 31 y.o.   MRN: [id number]  [facility] Progress Note   Assessment/ Plan:   1. Abdominal pain/nausea/vomiting: with GB dyskinesia- s/p laparoscopic cholecystectomy yesterday- with abdominal pain as expected post-op. Plans for mobilization and diet as noted from [facility] note.  2. ESRD: continue MWF HD- no volume excess noted and without acute HD needs noted for today 3. 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 Nutrition: advance diet per surgery recommendations 6. Hypertension: BP well controlled, will monitor  Subjective:   Complains of some abdominal pain post-op   Objective:   BP 125/80 (BP Location: Right Arm)   Pulse 97   Temp 99.2 F (37.3 C) (Oral)   Resp 19   Ht 5\' 6"  (1.676 m)   Wt (!) 143.7 kg (316 lb 12.8 oz)   SpO2 98%   BMI 51.13 kg/m   Physical Exam: HXT:[id number] resting in recliner PVV:ZSMOL regular rhythm, normal rate Resp:clear bilaterally, no rales/rhonchi MBE:MLJQ, obese, RUQ tenderness with guarding Ext:No LE edema noted  Labs: BMET  Recent Labs Lab 11/17/16 1149 11/18/16 2211 11/19/16 0742 11/20/16 0723 11/21/16 0551 11/23/16 0636 11/24/16 0418  NA 139 131* 135 138 135 135 137  K 3.9 3.7 4.2 4.3 3.8 4.2 4.0  CL 92* 91* 88* 93* 91* 92* 96*  CO2 25 20* 25 22 21* 19* 21*  GLUCOSE 121* 106* 90 92 73 93 78  BUN 55* 21* 27* 47* 21* 43* 18  CREATININE 13.93* 8.53* 9.92* 13.89* 9.25* 14.79* 9.99*  CALCIUM 8.2* 8.8* 8.9 8.0* 8.4* 7.2* 7.6*   CBC  Recent Labs Lab 11/19/16 0742 11/20/16 0723 11/21/16 0551 11/23/16 0636 11/24/16 0418  WBC 11.8* 11.8* 10.1 8.7 9.6  NEUTROABS 7.7  --   --   --   --   HGB 15.1 13.4 13.8 12.6* 12.4*  HCT 43.7 39.2 40.9 37.2* 37.0*  MCV 90.7 90.1 91.9 89.0 89.8  PLT 205 199 165 142* 154   Medications:    . acetaminophen  650 mg Oral Q6H  . doxercalciferol  2 mcg Intravenous Q  M,W,F-HD  . metoCLOPramide (REGLAN) injection  10 mg Intravenous Q8H  . pantoprazole (PROTONIX) IV  40 mg Intravenous Q12H   Elmarie Shiley, MD 11/24/2016, 9:49 AM

## 2016-11-24 NOTE — Care Management Note (Signed)
Case Management Note  Patient Details  Name: [name] MRN: [id number] Date of Birth: Mar 19, 1986  Subjective/Objective:                    Action/Plan:  No discharge needs identified. Will continue to follow. Expected Discharge Date:                  Expected Discharge Plan:  Home/Self Care  In-House Referral:     Discharge planning Services     Post Acute Care Choice:    Choice offered to:     DME Arranged:    DME Agency:     HH Arranged:    HH Agency:     Status of Service:  In process, will continue to follow  If discussed at Long Length of Stay Meetings, dates discussed:    Additional Comments:  Marilu Favre, RN 11/24/2016, 11:09 AM

## 2016-11-24 NOTE — Progress Notes (Signed)
[facility] PROGRESS NOTE  [name] KJZ:[id number] DOB: 1986-05-28 DOA: 11/19/2016  PCP: Philis Fendt, MD  Brief History/Interval Summary: 31 year old African-American male with a past medical history of end-stage renal disease on hemodialysis, history of gastroparesis, known cholelithiasis, hypertension, presented with complaints of abdominal pain, nausea and vomiting. Patient has been evaluated for similar complaints in the past. He was hospitalized for further management. General surgery was consulted. Patient underwent HIDA scan. Gallbladder dysfunction was noted. He underwent laparoscopic cholecystectomy.  Reason for Visit: Abdominal pain in the setting of cholelithiasis  Consultants: Nephrology. General surgery.  Procedures: Hemodialysis  Antibiotics: He was on Zosyn. This has been discontinued.  Subjective/Interval History: Patient complains of some pain in his right upper abdomen. No nausea, vomiting.   ROS: Denies any chest pain  Objective:  Vital Signs  Vitals:   11/23/16 1610 11/23/16 1900 11/24/16 0222 11/24/16 0516  BP: 123/73 122/73 123/73 125/80  Pulse: 88 92 97 97  Resp: (!) 22 20 20 19   Temp: 97.8 F (36.6 C) 98.9 F (37.2 C) 99.1 F (37.3 C) 99.2 F (37.3 C)  TempSrc: Oral Oral Oral Oral  SpO2: 99% 98% 97% 98%  Weight:   (!) 143.7 kg (316 lb 12.8 oz)   Height:        Intake/Output Summary (Last 24 hours) at 11/24/16 0943 Last data filed at 11/24/16 0908  Gross per 24 hour  Intake          1069.67 ml  Output               50 ml  Net          1019.67 ml   Filed Weights   11/23/16 1200 11/23/16 1215 11/24/16 0222  Weight: (!) 141.2 kg (311 lb 4.6 oz) (!) 141.2 kg (311 lb 4.6 oz) (!) 143.7 kg (316 lb 12.8 oz)    General appearance: Alert. No distress. Resp: Clear to auscultation bilaterally. No wheezing, rales or rhonchi.  Cardio: S1, S2 is normal, regular. No S3, S4. No rubs, murmurs, bruits GI: Abdomen is obese. Tender in the  right upper quadrant. No rebound, rigidity or guarding. No masses or organomegaly. Bowel sounds are present.  Neurologic: No focal neurological deficits.  Lab Results:  Data Reviewed: I have personally reviewed following labs and imaging studies  CBC:  Recent Labs Lab 11/19/16 0742 11/20/16 0723 11/21/16 0551 11/23/16 0636 11/24/16 0418  WBC 11.8* 11.8* 10.1 8.7 9.6  NEUTROABS 7.7  --   --   --   --   HGB 15.1 13.4 13.8 12.6* 12.4*  HCT 43.7 39.2 40.9 37.2* 37.0*  MCV 90.7 90.1 91.9 89.0 89.8  PLT 205 199 165 142* 948    Basic Metabolic Panel:  Recent Labs Lab 11/19/16 0742 11/20/16 0723 11/21/16 0551 11/23/16 0636 11/24/16 0418  NA 135 138 135 135 137  K 4.2 4.3 3.8 4.2 4.0  CL 88* 93* 91* 92* 96*  CO2 25 22 21* 19* 21*  GLUCOSE 90 92 73 93 78  BUN 27* 47* 21* 43* 18  CREATININE 9.92* 13.89* 9.25* 14.79* 9.99*  CALCIUM 8.9 8.0* 8.4* 7.2* 7.6*    GFR: Estimated Creatinine Clearance: 14.4 mL/min (A) (by C-G formula based on SCr of 9.99 mg/dL (H)).  Liver Function Tests:  Recent Labs Lab 11/17/16 1149 11/18/16 2211 11/19/16 0742 11/20/16 0723 11/21/16 0551  AST 17 17 15 15 20   ALT 19 19 19 17 20   ALKPHOS 64 70 73 58 54  BILITOT 0.9 1.4* 1.3* 1.1 2.0*  PROT 8.7* 9.1* 9.4* 8.2* 8.1  ALBUMIN 4.5 4.5 4.6 4.1 3.9     Recent Labs Lab 11/17/16 1149 11/18/16 2211  LIPASE 24 25     Recent Results (from the past 240 hour(s))  MRSA PCR Screening     Status: None   Collection Time: 11/19/16  4:17 PM  Result Value Ref Range Status   MRSA by PCR NEGATIVE NEGATIVE Final    Comment:        The GeneXpert MRSA Assay (FDA approved for NASAL specimens only), is one component of a comprehensive MRSA colonization surveillance program. It is not intended to diagnose MRSA infection nor to guide or monitor treatment for MRSA infections.       Radiology Studies: Dg Cholangiogram Operative  Result Date: 11/23/2016 CLINICAL DATA:  Intraoperative  cholangiogram during laparoscopic cholecystectomy. EXAM: INTRAOPERATIVE CHOLANGIOGRAM FLUOROSCOPY TIME:  11 seconds COMPARISON:  Nuclear medicine HIDA scan - 11/21/2016 ; CT abdomen pelvis -08/13/2016 FINDINGS: Intraoperative cholangiographic images of the right upper abdominal quadrant during laparoscopic cholecystectomy are provided for review. Surgical clips overlie the expected location of the gallbladder fossa. Contrast injection demonstrates selective cannulation of the central aspect of the cystic duct. There is passage of contrast through the central aspect of the cystic duct. 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 There is passage of contrast though the CBD and into the descending portion of the duodenum. There is minimal reflux of injected contrast into the common hepatic duct and central aspect of the non dilated intrahepatic biliary system. There are no discrete filling defects within the opacified portions of the biliary system to suggest the presence of choledocholithiasis. IMPRESSION: 1. No evidence of choledocholithiasis. 2. 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 Electronically Signed   By: Sandi Mariscal M.D.   On: 11/23/2016 14:29     Medications:  Scheduled: . acetaminophen  650 mg Oral Q6H  . doxercalciferol  2 mcg Intravenous Q M,W,F-HD  . metoCLOPramide (REGLAN) injection  10 mg Intravenous Q8H  . pantoprazole (PROTONIX) IV  40 mg Intravenous Q12H   Continuous: . sodium chloride Stopped (11/23/16 1935)  . sodium chloride 10 mL/hr at 11/23/16 1259  . piperacillin-tazobactam (ZOSYN)  IV Stopped (11/24/16 0456)   QIH:KVQQVZDGL, fentaNYL (SUBLIMAZE) injection, LORazepam, methocarbamol, ondansetron (ZOFRAN) IV **OR** promethazine, oxyCODONE  Assessment/Plan:  Principal Problem:   RUQ pain Active Problems:   Morbid obesity, weight 291, BMI -  47   Lapband April 2016   CKD (chronic kidney disease) stage V requiring chronic dialysis (HCC)   Hepatic steatosis   Cholelithiasis   GERD (gastroesophageal reflux disease)   Essential hypertension   ?? Gastroparesis- NORMAL NM gastric emptying study 2016   Gastritis   Increased anion gap metabolic acidosis   Intractable abdominal pain   Common bile duct dilatation   Cholelithiasis without cholecystitis   RUQ abdominal pain    RUQ pain/Cholelithiasis His symptoms are chronic for the most part. At the same time cholelithiasis could be contributing. General surgery was consulted. Underwent a HIDA scan 4/21. No evidence for cholecystitis. However, there was evidence for gallbladder dysfunction with a reduced ejection fraction of 15%. Patient underwent laparoscopic cholecystectomy on 4/23. No other reason for his pain has been determined. He has been seen by gastroenterology previously as well. He has had gastric banding before, but this  has been reversed. Had hepatobiliary scan November 2017 with normal ejection fraction and no ductal obstruction. Continue Zosyn for now. Patient underwent upper endoscopy in January. Retained gastric fluid was noted. Also noted to have gastritis and duodenitis. Otherwise, the endoscopy was unremarkable.    Questionable Gastroparesis He had a normal gastric emptying study in 2016. Recent endoscopy did show retained fluid in the stomach. Continue Reglan for now. Change to oral.  Gastritis/GERD (gastroesophageal reflux disease) Previous EGD revealed mild gastritis. Was not on PPI, H2 blocker or Carafate prior to admission. Biopsies from January 2018 EGD negative for H. Pylori. Continue Protonix.   End-stage renal disease requiring chronic dialysis  MWF schedule. Nephrology is following. Being dialyzed as per his usual schedule.   Essential hypertension Not on medical therapy prior to admission  Increased anion gap metabolic acidosis Has been chronically  elevated. This is likely due to his renal failure. Patient does not have any history of diabetes.  Morbid obesity Body mass index is 50.6 kg/m.  Hepatic steatosis AST and ALT are normal.   DVT Prophylaxis: Subcutaneous heparin    Code Status: Full code  Family Communication: Discussed with the patient  Disposition Plan: Patient is stable. Anticipate discharge tomorrow after hemodialysis.    LOS: 4 days   [facility] Pager [phone] 11/24/2016, 9:43 AM  If 7PM-7AM, please contact night-coverage at [email or web address], password [facility]

## 2016-11-24 NOTE — Care Management Important Message (Signed)
Important Message  Patient Details  Name: [name] MRN: [id number] Date of Birth: [birth year]   Medicare Important Message Given:  Yes    Ida Uppal 11/24/2016, 1:40 PM

## 2016-11-24 NOTE — Discharge Instructions (Signed)
please arrive at least 30 min before your appointment to complete your check in paperwork.  If you are unable to arrive 30 min prior to your appointment time we may have to cancel or reschedule you.  LAPAROSCOPIC SURGERY: POST OP INSTRUCTIONS  1. DIET: Follow a light bland diet the first 24 hours after arrival home, such as soup, liquids, crackers, etc. Be sure to include lots of fluids daily. Avoid fast food or heavy meals as your are more likely to get nauseated. Eat a low fat the next few days after surgery.  2. Take your usually prescribed home medications unless otherwise directed. 3. PAIN CONTROL:  1. Pain is best controlled by a usual combination of three different methods TOGETHER:  1. Ice/Heat 2. Over the counter pain medication 3. Prescription pain medication 2. Most patients will experience some swelling and bruising around the incisions. Ice packs or heating pads (30-60 minutes up to 6 times a day) will help. Use ice for the first few days to help decrease swelling and bruising, then switch to heat to help relax tight/sore spots and speed recovery. Some people prefer to use ice alone, heat alone, alternating between ice & heat. Experiment to what works for you. Swelling and bruising can take several [place] to resolve.  3. It is helpful to take an over-the-counter pain medication regularly for the first few [place]. Choose one of the following that works best for you:  1. Naproxen (Aleve, etc) Two 220mg  tabs twice a day 2. Ibuprofen (Advil, etc) Three 200mg  tabs four times a day (every meal & bedtime) 3. Acetaminophen (Tylenol, etc) 500-650mg  four times a day (every meal & bedtime) 4. A prescription for pain medication (such as oxycodone, hydrocodone, etc) should be given to you upon discharge. Take your pain medication as prescribed.  1. If you are having problems/concerns with the prescription medicine (does not control pain, nausea, vomiting, rash, itching, etc), please call us [phone]  [phone] to see if we need to switch you to a different pain medicine that will work better for you and/or control your side effect better. 2. If you need a refill on your pain medication, please contact your pharmacy. They will contact our office to request authorization. Prescriptions will not be filled after 5 pm or on week-ends. 4. Avoid getting constipated. Between the surgery and the pain medications, it is common to experience some constipation. Increasing fluid intake and taking a fiber supplement (such as Metamucil, Citrucel, FiberCon, MiraLax, etc) 1-2 times a day regularly will usually help prevent this problem from occurring. A mild laxative (prune juice, Milk of Magnesia, MiraLax, etc) should be taken according to package directions if there are no bowel movements after 48 hours.  5. Watch out for diarrhea. If you have many loose bowel movements, simplify your diet to bland foods & liquids for a few days. Stop any stool softeners and decrease your fiber supplement. Switching to mild anti-diarrheal medications (Kayopectate, Pepto Bismol) can help. If this worsens or does not improve, please call us. 6. Wash / shower every day. You may shower over the dressings as they are waterproof. Continue to shower over incision(s) after the dressing is off. 7. Remove your waterproof bandages 5 days after surgery. You may leave the incision open to air. You may replace a dressing/Band-Aid to cover the incision for comfort if you wish.  8. ACTIVITIES as tolerated:  1. You may resume regular (light) daily activities beginning the next day--such as daily self-care, walking, climbing stairs--gradually  increasing activities as tolerated. If you can walk 30 minutes without difficulty, it is safe to try more intense activity such as jogging, treadmill, bicycling, low-impact aerobics, swimming, etc. °2. Save the most intensive and strenuous activity for last such as sit-ups, heavy lifting, contact sports, etc Refrain  from any heavy lifting or straining until you are off narcotics for pain control.  °3. DO NOT PUSH THROUGH PAIN. Let pain be your guide: If it hurts to do something, don't do it. Pain is your body warning you to avoid that activity for another week until the pain goes down. °4. You may drive when you are no longer taking prescription pain medication, you can comfortably wear a seatbelt, and you can safely maneuver your car and apply brakes. °5. You may have sexual intercourse when it is comfortable.  °9. FOLLOW UP in our office  °1. Please call [facility] at [phone] to set up an appointment to see your surgeon in the office for a follow-up appointment approximately 2-3 [place] after your surgery. °2. Make sure that you call for this appointment the day you arrive home to insure a convenient appointment time. °     10. IF YOU HAVE DISABILITY OR FAMILY LEAVE FORMS, BRING THEM TO THE               OFFICE FOR PROCESSING.  ° °WHEN TO CALL US [phone]:  °1. Poor pain control °2. Reactions / problems with new medications (rash/itching, nausea, etc)  °3. Fever over 101.5 F (38.5 C) °4. Inability to urinate °5. Nausea and/or vomiting °6. Worsening swelling or bruising °7. Continued bleeding from incision. °8. Increased pain, redness, or drainage from the incision ° °The clinic staff is available to answer your questions during regular business hours (8:30am-5pm). Please don’t hesitate to call and ask to speak to one of our nurses for clinical concerns.  °If you have a medical emergency, go to the nearest emergency room or call 911.  °A surgeon from [facility] is always on call at the hospitals  ° °[facility]  °[address], [address], [place], [place] [address] ?  °MAIN: [phone] ? TOLL FREE: [phone] ?  °FAX [phone]  °[email or web address] ° °

## 2016-11-24 NOTE — Progress Notes (Signed)
[facility] Progress Note  1 Day Post-Op  Subjective: CC: abdominal pain s/p laparoscopic cholecystectomy Pain is improved with PO pain medication. Pain is worse with movement or coughing. Patient denies urination since surgery however at baseline does not make much urine 2/2 ESRD stating that "I only make a little urine when I have a BM". denies fever, chills, nausea, or vomiting. Tolerating PO. Pulling 750 cc on IS.  Objective: Vital signs in last 24 hours: Temp:  [97.5 F (36.4 C)-99.2 F (37.3 C)] 99.2 F (37.3 C) (04/24 0516) Pulse Rate:  [69-97] 97 (04/24 0516) Resp:  [16-22] 19 (04/24 0516) BP: (78-125)/(36-83) 125/80 (04/24 0516) SpO2:  [97 %-100 %] 98 % (04/24 0516) Weight:  [141.2 kg (311 lb 4.6 oz)-143.7 kg (316 lb 12.8 oz)] 143.7 kg (316 lb 12.8 oz) (04/24 0222) Last BM Date: 11/20/16  Intake/Output from previous day: 04/23 0701 - 04/24 0700 In: 709.7 [I.V.:659.7; IV Piggyback:50] Out: 50 [Blood:50] Intake/Output this shift: No intake/output data recorded.  PE: Gen:  Alert, NAD, pleasant Card:  Regular rate and rhythm Pulm:  Non-labored, clear to auscultation bilaterally Abd: Soft, obese, appropriately tender, non-distended, bowel sounds present in all 4 quadrants Ext:  No erythema, edema, or tenderness   Lab Results:   Recent Labs  11/23/16 0636 11/24/16 0418  WBC 8.7 9.6  HGB 12.6* 12.4*  HCT 37.2* 37.0*  PLT 142* 154   BMET  Recent Labs  11/23/16 0636 11/24/16 0418  NA 135 137  K 4.2 4.0  CL 92* 96*  CO2 19* 21*  GLUCOSE 93 78  BUN 43* 18  CREATININE 14.79* 9.99*  CALCIUM 7.2* 7.6*   PT/INR No results for input(s): LABPROT, INR in the last 72 hours. CMP     Component Value Date/Time   NA 137 11/24/2016 0418   K 4.0 11/24/2016 0418   CL 96 (L) 11/24/2016 0418   CO2 21 (L) 11/24/2016 0418   GLUCOSE 78 11/24/2016 0418   BUN 18 11/24/2016 0418   CREATININE 9.99 (H) 11/24/2016 0418   CALCIUM 7.6 (L) 11/24/2016 0418   PROT 8.1 11/21/2016 0551   ALBUMIN 3.9 11/21/2016 0551   AST 20 11/21/2016 0551   ALT 20 11/21/2016 0551   ALKPHOS 54 11/21/2016 0551   BILITOT 2.0 (H) 11/21/2016 0551   GFRNONAA 6 (L) 11/24/2016 0418   GFRAA 7 (L) 11/24/2016 0418   Lipase     Component Value Date/Time   LIPASE 25 11/18/2016 2211       Studies/Results: Dg Cholangiogram Operative  Result Date: 11/23/2016 CLINICAL DATA:  Intraoperative cholangiogram during laparoscopic cholecystectomy. EXAM: INTRAOPERATIVE CHOLANGIOGRAM FLUOROSCOPY TIME:  11 seconds COMPARISON:  Nuclear medicine HIDA scan - 11/21/2016 ; CT abdomen pelvis -08/13/2016 FINDINGS: Intraoperative cholangiographic images of the right upper abdominal quadrant during laparoscopic cholecystectomy are provided for review. Surgical clips overlie the expected location of the gallbladder fossa. Contrast injection demonstrates selective cannulation of the central aspect of the cystic duct. There is passage of contrast through the central aspect of the cystic duct. 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 There is passage of contrast though the CBD and into the descending portion of the duodenum. There is minimal reflux of injected contrast into the common hepatic duct and central aspect of the non dilated intrahepatic biliary system. There are no discrete filling defects within the opacified portions of the biliary system to suggest the presence of choledocholithiasis.  IMPRESSION: 1. No evidence of choledocholithiasis. 2. 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 Electronically Signed   By: Sandi Mariscal M.D.   On: 11/23/2016 14:29    Anti-infectives: Anti-infectives    Start     Dose/Rate Route Frequency Ordered Stop   11/23/16 1300  ceFAZolin (ANCEF) 3 g in dextrose 5 % 50 mL IVPB     3 g 130 mL/hr over 30 Minutes Intravenous On call to  O.R. 11/23/16 1249 11/23/16 1333   11/19/16 1300  piperacillin-tazobactam (ZOSYN) IVPB 3.375 g     3.375 g 12.5 mL/hr over 240 Minutes Intravenous Every 12 hours 11/19/16 1236       Assessment/Plan Chronic abdominal pain Intractable vomiting  Symptomatic cholelithiasis  Cholestasis  S/P laparoscopic cholecystectomy with IOC 11/23/16, Dr. Erroll Luna - advance diet to low fat as tolerated  - PO pain control - scheduled tylenol and PRN oxycodone - mobilize!! IS!!  - ok to d/c abx from general surgery standpoint   ESRD on HD HTN  Outpatient follow-up and discharge instructions provided    LOS: 4 days    Jill Alexanders , [facility] Surgery 11/24/2016, 7:37 AM Pager: [phone] Consults: [phone] Mon-Fri 7:00 am-4:30 pm Sat-Sun 7:00 am-11:30 am

## 2016-11-25 ENCOUNTER — Other Ambulatory Visit: Payer: Self-pay | Admitting: Physical Medicine & Rehabilitation

## 2016-11-25 DIAGNOSIS — R1011 Right upper quadrant pain: Secondary | ICD-10-CM

## 2016-11-25 LAB — COMPREHENSIVE METABOLIC PANEL
ALT: 10 U/L — AB (ref 17–63)
ANION GAP: 15 (ref 5–15)
AST: 28 U/L (ref 15–41)
Albumin: 2.9 g/dL — ABNORMAL LOW (ref 3.5–5.0)
Alkaline Phosphatase: 42 U/L (ref 38–126)
BUN: 28 mg/dL — ABNORMAL HIGH (ref 6–20)
CHLORIDE: 95 mmol/L — AB (ref 101–111)
CO2: 23 mmol/L (ref 22–32)
Calcium: 7 mg/dL — ABNORMAL LOW (ref 8.9–10.3)
Creatinine, Ser: 12.08 mg/dL — ABNORMAL HIGH (ref 0.61–1.24)
GFR, EST AFRICAN AMERICAN: 6 mL/min — AB (ref >60)
GFR, EST NON AFRICAN AMERICAN: 5 mL/min — AB (ref >60)
Glucose, Bld: 86 mg/dL (ref 65–99)
POTASSIUM: 4.1 mmol/L (ref 3.5–5.1)
Sodium: 133 mmol/L — ABNORMAL LOW (ref 135–145)
Total Bilirubin: 0.6 mg/dL (ref 0.3–1.2)
Total Protein: 6.1 g/dL — ABNORMAL LOW (ref 6.5–8.1)

## 2016-11-25 LAB — CBC
HCT: 31 % — ABNORMAL LOW (ref 39.0–52.0)
Hemoglobin: 10.5 g/dL — ABNORMAL LOW (ref 13.0–17.0)
MCH: 30.7 pg (ref 26.0–34.0)
MCHC: 33.9 g/dL (ref 30.0–36.0)
MCV: 90.6 fL (ref 78.0–100.0)
PLATELETS: 139 10*3/uL — AB (ref 150–400)
RBC: 3.42 MIL/uL — AB (ref 4.22–5.81)
RDW: 14.2 % (ref 11.5–15.5)
WBC: 7.2 10*3/uL (ref 4.0–10.5)

## 2016-11-25 MED ORDER — DOXERCALCIFEROL 4 MCG/2ML IV SOLN
INTRAVENOUS | Status: AC
  Filled 2016-11-25: qty 2

## 2016-11-25 NOTE — Progress Notes (Signed)
2 Days Post-Op  Subjective: CC abdominal pain mostly at ports   Objective: Vital signs in last 24 hours: Temp:  [97.6 F (36.4 C)-98.4 F (36.9 C)] 98.4 F (36.9 C) (04/25 0500) Pulse Rate:  [87-104] 87 (04/25 0500) Resp:  [18-19] 18 (04/25 0500) BP: (98-109)/(56-61) 98/56 (04/25 0500) SpO2:  [98 %-99 %] 98 % (04/25 0500) Weight:  [147.3 kg (324 lb 11.8 oz)] 147.3 kg (324 lb 11.8 oz) (04/25 0500) Last BM Date: 11/20/16  Intake/Output from previous day: 04/24 0701 - 04/25 0700 In: 920 [P.O.:920] Out: 0  Intake/Output this shift: No intake/output data recorded.  Incision/Wound:CDI soft   Lab Results:   Recent Labs  11/24/16 0418 11/25/16 0430  WBC 9.6 7.2  HGB 12.4* 10.5*  HCT 37.0* 31.0*  PLT 154 139*   BMET  Recent Labs  11/24/16 0418 11/25/16 0430  NA 137 133*  K 4.0 4.1  CL 96* 95*  CO2 21* 23  GLUCOSE 78 86  BUN 18 28*  CREATININE 9.99* 12.08*  CALCIUM 7.6* 7.0*   PT/INR No results for input(s): LABPROT, INR in the last 72 hours. ABG No results for input(s): PHART, HCO3 in the last 72 hours.  Invalid input(s): PCO2, PO2  Studies/Results: Dg Cholangiogram Operative  Result Date: 11/23/2016 CLINICAL DATA:  Intraoperative cholangiogram during laparoscopic cholecystectomy. EXAM: INTRAOPERATIVE CHOLANGIOGRAM FLUOROSCOPY TIME:  11 seconds COMPARISON:  Nuclear medicine HIDA scan - 11/21/2016 ; CT abdomen pelvis -08/13/2016 FINDINGS: Intraoperative cholangiographic images of the right upper abdominal quadrant during laparoscopic cholecystectomy are provided for review. Surgical clips overlie the expected location of the gallbladder fossa. Contrast injection demonstrates selective cannulation of the central aspect of the cystic duct. There is passage of contrast through the central aspect of the cystic duct. 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 There is passage of contrast though  the CBD and into the descending portion of the duodenum. There is minimal reflux of injected contrast into the common hepatic duct and central aspect of the non dilated intrahepatic biliary system. There are no discrete filling defects within the opacified portions of the biliary system to suggest the presence of choledocholithiasis. IMPRESSION: 1. No evidence of choledocholithiasis. 2. 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 Electronically Signed   By: Sandi Mariscal M.D.   On: 11/23/2016 14:29    Anti-infectives: Anti-infectives    Start     Dose/Rate Route Frequency Ordered Stop   11/23/16 1300  ceFAZolin (ANCEF) 3 g in dextrose 5 % 50 mL IVPB     3 g 130 mL/hr over 30 Minutes Intravenous On call to O.R. 11/23/16 1249 11/23/16 1333   11/19/16 1300  piperacillin-tazobactam (ZOSYN) IVPB 3.375 g  Status:  Discontinued     3.375 g 12.5 mL/hr over 240 Minutes Intravenous Every 12 hours 11/19/16 1236 11/24/16 1010      Assessment/Plan: s/p Procedure(s): LAPAROSCOPIC CHOLECYSTECTOMY WITH INTRAOPERATIVE CHOLANGIOGRAM (N/A) Home when medically stable   LOS: 5 days    [name] A. 11/25/2016

## 2016-11-25 NOTE — Anesthesia Postprocedure Evaluation (Signed)
Anesthesia Post Note  Patient: [name]  Procedure(s) Performed: Procedure(s) (LRB): LAPAROSCOPIC CHOLECYSTECTOMY WITH INTRAOPERATIVE CHOLANGIOGRAM (N/A)  Patient location during evaluation: PACU Anesthesia Type: General Level of consciousness: awake Pain management: pain level controlled Vital Signs Assessment: post-procedure vital signs reviewed and stable Respiratory status: spontaneous breathing Cardiovascular status: stable Anesthetic complications: no       Last Vitals:  Vitals:   11/25/16 1130 11/25/16 1216  BP: (!) 94/42 (!) 109/56  Pulse: 84 81  Resp:  16  Temp:  36.3 C    Last Pain:  Vitals:   11/25/16 1216  TempSrc: Oral  PainSc: 0-No pain                 Sera Hitsman

## 2016-11-25 NOTE — Discharge Summary (Signed)
Physician Discharge Summary  SEVEN DOLLENS OFB:[id number] DOB: 04-Dec-1985 DOA: 11/19/2016  PCP: Philis Fendt, MD  Admit date: 11/19/2016 Discharge date: 11/25/2016  Time spent: > 35 minutes  Recommendations for Outpatient Follow-up:  1. Ensure patient f/u with General surgery 2. Ensure patient continues routine f/u with nephrology   Discharge Diagnoses:  Principal Problem:   RUQ pain Active Problems:   Morbid obesity, weight 291, BMI - 47   Lapband April 2016   CKD (chronic kidney disease) stage V requiring chronic dialysis (HCC)   Hepatic steatosis   Cholelithiasis   GERD (gastroesophageal reflux disease)   Essential hypertension   ?? Gastroparesis- NORMAL NM gastric emptying study 2016   Gastritis   Increased anion gap metabolic acidosis   Intractable abdominal pain   Common bile duct dilatation   Cholelithiasis without cholecystitis   RUQ abdominal pain   Discharge Condition: stable  Diet recommendation: renal diet  Filed Weights   11/25/16 0500 11/25/16 0810 11/25/16 1216  Weight: (!) 147.3 kg (324 lb 11.8 oz) (!) 143.7 kg (316 lb 12.8 oz) (!) 141.1 kg (311 lb 1.1 oz)    History of present illness:   31 year old African-American male with a past medical history of end-stage renal disease on hemodialysis, history of gastroparesis, known cholelithiasis, hypertension, presented with complaints of abdominal pain, nausea and vomiting. Patient has been evaluated for similar complaints in the past. He was hospitalized for further management. General surgery was consulted. Patient underwent HIDA scan. Gallbladder dysfunction was noted. He underwent laparoscopic cholecystectomy.  Hospital Course:   RUQ pain/Cholelithiasis His symptoms are chronic for the most part. At the same time cholelithiasis could be contributing. General surgery was consulted. Underwent a HIDA scan 4/21. No evidence for cholecystitis. However, there was evidence for gallbladder dysfunction with a  reduced ejection fraction of 15%. Patient underwent laparoscopic cholecystectomy on 4/23. No other reason for his pain has been determined. He has been seen by gastroenterology previously as well. He has had gastric banding before, but this has been reversed. Had hepatobiliary scan November 2017 with normal ejection fraction and no ductal obstruction. Continue Zosyn for now. Patient underwent upper endoscopy in January. Retained gastric fluid was noted. Also noted to have gastritis and duodenitis. Otherwise, the endoscopy was unremarkable.    Questionable Gastroparesis He had a normal gastric emptying study in 2016. Recent endoscopy did show retained fluid in the stomach. Continue Reglan  Gastritis/GERD (gastroesophageal reflux disease) Previous EGD revealed mild gastritis. Was not on PPI, H2 blocker or Carafate prior to admission. Biopsies from January 2018 EGD negative for H. Pylori. Continue Protonix.   End-stage renal disease requiring chronic dialysis  MWFschedule. Nephrology is following. Being dialyzed as per his usual schedule.  Essential hypertension Not on medical therapy prior to admission  Increased anion gap metabolic acidosis Has been chronically elevated. This is likely due to his renal failure. Patient does not have any history of diabetes.  Morbid obesity Body mass index is 50.6 kg/m.  Hepatic steatosis AST and ALT are normal.  Procedures:  laparoscopic cholecystectomy  Consultations:  Nephrology  Discharge Exam: Vitals:   11/25/16 1130 11/25/16 1216  BP: (!) 94/42 (!) 109/56  Pulse: 84 81  Resp:  16  Temp:  97.4 F (36.3 C)    General: Pt in nad, alert and awake Cardiovascular: rrr, no rubs Respiratory: no increased wob, no wheezes  Discharge Instructions   Discharge Instructions    Call MD for:  extreme fatigue    Complete by:  As directed    Call MD for:  temperature >100.4    Complete by:  As directed    Diet - low sodium heart healthy     Complete by:  As directed    Discharge instructions    Complete by:  As directed    Please follow up with your general surgeon as per their recommendation based on their conversation with you while in house.   Increase activity slowly    Complete by:  As directed      Current Discharge Medication List    CONTINUE these medications which have NOT CHANGED   Details  albuterol (PROVENTIL HFA;VENTOLIN HFA) 108 (90 Base) MCG/ACT inhaler Inhale 2 puffs into the lungs every 4 (four) hours as needed for wheezing or shortness of breath. Qty: 1 Inhaler, Refills: 0    calcium carbonate (TUMS - DOSED IN MG ELEMENTAL CALCIUM) 500 MG chewable tablet Chew 2 tablets by mouth 2 (two) times daily as needed for indigestion or heartburn.     glycopyrrolate (ROBINUL) 2 MG tablet Take 1 tablet (2 mg total) by mouth 2 (two) times daily. Qty: 60 tablet, Refills: 3    Hyoscyamine Sulfate 0.375 MG TBCR Take 1 tablet twice daily Qty: 60 tablet, Refills: 1    lidocaine (XYLOCAINE) 2 % jelly Apply 1 application topically as needed. Qty: 30 mL, Refills: 1    methocarbamol (ROBAXIN) 500 MG tablet Take 1 tablet (500 mg total) by mouth every 8 (eight) hours as needed for muscle spasms. Qty: 20 tablet, Refills: 0    metoCLOPramide (REGLAN) 5 MG tablet Take 1 tablet (5 mg total) by mouth 2 (two) times daily before a meal. Qty: 60 tablet, Refills: 0    multivitamin (RENA-VIT) TABS tablet Take 1 tablet by mouth daily.    omeprazole (PRILOSEC) 20 MG capsule Take 1 capsule (20 mg total) by mouth daily. Qty: 30 capsule, Refills: 0    ondansetron (ZOFRAN) 4 MG tablet Take 1 tablet (4 mg total) by mouth every 8 (eight) hours as needed for nausea or vomiting. Qty: 20 tablet, Refills: 0    sevelamer carbonate (RENVELA) 2.4 G PACK Take 2.4 g by mouth 3 (three) times daily with meals.     traMADol (ULTRAM) 50 MG tablet Take 1 tablet (50 mg total) by mouth every 8 (eight) hours as needed. Qty: 30 tablet, Refills: 0       STOP taking these medications     promethazine (PHENERGAN) 25 MG suppository      promethazine (PHENERGAN) 25 MG tablet      VOLTAREN 1 % GEL        Allergies  Allergen Reactions  . Dilaudid [Hydromorphone Hcl] Other (See Comments)    Patient became hypoxic to 60% with dilaudid 1mg  IV   Follow-up [facility] Go on 12/15/2016.   Specialty:  General Surgery Why:  at 2:15 PM for post-operative appointment. Contact information: [address] [place] [employer] [place] [phone]           The results of significant diagnostics from this hospitalization (including imaging, microbiology, ancillary and laboratory) are listed below for reference.    Significant Diagnostic Studies: Dg Cholangiogram Operative  Result Date: 11/23/2016 CLINICAL DATA:  Intraoperative cholangiogram during laparoscopic cholecystectomy. EXAM: INTRAOPERATIVE CHOLANGIOGRAM FLUOROSCOPY TIME:  11 seconds COMPARISON:  Nuclear medicine HIDA scan - 11/21/2016 ; CT abdomen pelvis -08/13/2016 FINDINGS: Intraoperative cholangiographic images of the right upper abdominal quadrant during laparoscopic  cholecystectomy are provided for review. Surgical clips overlie the expected location of the gallbladder fossa. Contrast injection demonstrates selective cannulation of the central aspect of the cystic duct. There is passage of contrast through the central aspect of the cystic duct. 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 There is passage of contrast though the CBD and into the descending portion of the duodenum. There is minimal reflux of injected contrast into the common hepatic duct and central aspect of the non dilated intrahepatic biliary system. There are no discrete filling defects within the opacified portions of the biliary system to suggest the presence of  choledocholithiasis. IMPRESSION: 1. No evidence of choledocholithiasis. 2. 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 Electronically Signed   By: Sandi Mariscal M.D.   On: 11/23/2016 14:29   Mr Abdomen Mrcp Wo Contrast  Result Date: 11/19/2016 CLINICAL DATA:  Inpatient. End-stage renal disease on hemodialysis. Right upper quadrant abdominal pain. Cholelithiasis and dilated common bile duct on abdominal sonogram from earlier today. EXAM: MRI ABDOMEN WITHOUT CONTRAST  (INCLUDING MRCP) TECHNIQUE: Multiplanar multisequence MR imaging of the abdomen was performed. Heavily T2-weighted images of the biliary and pancreatic ducts were obtained, and three-dimensional MRCP images were rendered by post processing. COMPARISON:  11/19/2016 right upper quadrant abdominal sonogram. 08/13/2016 CT abdomen/pelvis. FINDINGS: Lower chest: Mild hypoventilatory changes in the dependent lung bases. Hepatobiliary: Normal liver size and configuration. Prominent signal loss throughout the liver on the inphase sequence, compatible with diffuse hepatic iron deposition. No liver mass. Nondistended gallbladder contains multiple subcentimeter layering gallstones, with no gallbladder wall thickening or pericholecystic fluid. No intrahepatic biliary ductal dilatation. Common bile duct diameter 6 mm, upper normal, with smooth distal tapering. No biliary filling defects to suggest choledocholithiasis. No biliary strictures or masses. No ampullary mass. Pancreas: No pancreatic mass or duct dilation. 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 Spleen: Normal size spleen. Diffuse hepatic iron deposition in the spleen. No splenic mass. Adrenals/Urinary Tract: Normal adrenals. Symmetric severe bilateral renal parenchymal atrophy. Kidneys are largely replaced  by innumerable renal cysts, most of which appear simple measuring up to the 4.0 cm in the upper left kidney, with a few scattered subcentimeter T1 hyperintense renal cysts in both kidneys probably representing hemorrhagic/proteinaceous renal cysts, although incompletely characterized on this noncontrast scan. No hydronephrosis. Stomach/Bowel: Grossly normal stomach. Visualized small and large bowel is normal caliber, with no bowel wall thickening. Vascular/Lymphatic: Normal caliber abdominal aorta. No pathologically enlarged lymph nodes in the abdomen. Other: No abdominal ascites or focal fluid collection. Musculoskeletal: No aggressive appearing focal osseous lesions. IMPRESSION: 1. Cholelithiasis.  No MRI findings of acute cholecystitis. 2. No intrahepatic biliary ductal dilatation. Common bile duct diameter 6 mm, top-normal. No evidence of choledocholithiasis. 3. Diffuse iron deposition in the liver and spleen. 4. Symmetric severe bilateral renal parenchymal atrophy. Innumerable simple and tiny complex renal cysts in both kidneys. Electronically Signed   By: Ilona Sorrel M.D.   On: 11/19/2016 14:13   Mr 3d Recon At Scanner  Result Date: 11/19/2016 CLINICAL DATA:  Inpatient. End-stage renal disease on hemodialysis. Right upper quadrant abdominal pain. Cholelithiasis and dilated common bile duct on abdominal sonogram from earlier today. EXAM: MRI ABDOMEN WITHOUT CONTRAST  (INCLUDING MRCP) TECHNIQUE: Multiplanar multisequence MR  imaging of the abdomen was performed. Heavily T2-weighted images of the biliary and pancreatic ducts were obtained, and three-dimensional MRCP images were rendered by post processing. COMPARISON:  11/19/2016 right upper quadrant abdominal sonogram. 08/13/2016 CT abdomen/pelvis. FINDINGS: Lower chest: Mild hypoventilatory changes in the dependent lung bases. Hepatobiliary: Normal liver size and configuration. Prominent signal loss throughout the liver on the inphase sequence, compatible  with diffuse hepatic iron deposition. No liver mass. Nondistended gallbladder contains multiple subcentimeter layering gallstones, with no gallbladder wall thickening or pericholecystic fluid. No intrahepatic biliary ductal dilatation. Common bile duct diameter 6 mm, upper normal, with smooth distal tapering. No biliary filling defects to suggest choledocholithiasis. No biliary strictures or masses. No ampullary mass. Pancreas: No pancreatic mass or duct dilation. 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 Spleen: Normal size spleen. Diffuse hepatic iron deposition in the spleen. No splenic mass. Adrenals/Urinary Tract: Normal adrenals. Symmetric severe bilateral renal parenchymal atrophy. Kidneys are largely replaced by innumerable renal cysts, most of which appear simple measuring up to the 4.0 cm in the upper left kidney, with a few scattered subcentimeter T1 hyperintense renal cysts in both kidneys probably representing hemorrhagic/proteinaceous renal cysts, although incompletely characterized on this noncontrast scan. No hydronephrosis. Stomach/Bowel: Grossly normal stomach. Visualized small and large bowel is normal caliber, with no bowel wall thickening. Vascular/Lymphatic: Normal caliber abdominal aorta. No pathologically enlarged lymph nodes in the abdomen. Other: No abdominal ascites or focal fluid collection. Musculoskeletal: No aggressive appearing focal osseous lesions. IMPRESSION: 1. Cholelithiasis.  No MRI findings of acute cholecystitis. 2. No intrahepatic biliary ductal dilatation. Common bile duct diameter 6 mm, top-normal. No evidence of choledocholithiasis. 3. Diffuse iron deposition in the liver and spleen. 4. Symmetric severe bilateral renal parenchymal atrophy. Innumerable simple and tiny complex renal cysts in both kidneys. Electronically Signed   By:  Ilona Sorrel M.D.   On: 11/19/2016 14:13   Nm Hepato W/eject Fract  Result Date: 11/21/2016 CLINICAL DATA:  Patient with abdominal pain. Gallstones on MRI and ultrasound. EXAM: NUCLEAR MEDICINE HEPATOBILIARY IMAGING WITH GALLBLADDER EF TECHNIQUE: Sequential images of the abdomen were obtained out to 60 minutes following intravenous administration of radiopharmaceutical. After oral ingestion of Ensure, gallbladder ejection fraction was determined. At 60 min, normal ejection fraction is greater than 33%. RADIOPHARMACEUTICALS:  5.15 mCi Tc-58m  Choletec IV COMPARISON:  None. FINDINGS: There is homogeneous accumulation of radiotracer by the liver. Gallbladder activity is visualized, consistent with patency of cystic duct. Biliary activity passes into small bowel, consistent with patent common bile duct. Calculated gallbladder ejection fraction is twelfth%. (Normal gallbladder ejection fraction with Ensure is greater than 33%.) IMPRESSION: 1. Normal appearance of the liver. 2. Normal gallbladder filling.  No evidence of acute cholecystitis. 3. Normal small bowel filling reflecting a patent common bile duct. 4. Gallbladder dysfunction reflected by a depressed ejection fraction of 12%. Electronically Signed   By: Lajean Manes M.D.   On: 11/21/2016 14:31   US Abdomen Limited Ruq  Result Date: 11/19/2016 CLINICAL DATA:  Right upper quadrant pain starting Monday, renal insufficiency, obesity EXAM: US ABDOMEN LIMITED - RIGHT UPPER QUADRANT COMPARISON:  10/08/2016 FINDINGS: Gallbladder: Gallstones are noted within gallbladder the largest measures 9 mm. No thickening of gallbladder wall. No sonographic Murphy's sign. Common bile duct: Diameter: 6.3 mm in diameter prominent in size for age without CBD filling defects. Liver: No focal lesion identified.  Within normal limits in parenchymal echogenicity. There is echogenic right kidney probable chronic medical renal disease. IMPRESSION: 1. Gallstones are noted within  gallbladder the largest measures 9 mm. No sonographic Murphy's sign. No thickening of gallbladder wall. 2. CBD measures 6.3 mm in diameter prominent in size for age. No CBD filling defects. 3. Normal liver echogenicity. Electronically Signed   By: Lahoma Crocker M.D.   On: 11/19/2016 08:25    Microbiology: Recent Results (from the past 240 hour(s))  MRSA PCR Screening     Status: None   Collection Time: 11/19/16  4:17 PM  Result Value Ref Range Status   MRSA by PCR NEGATIVE NEGATIVE Final    Comment:        The GeneXpert MRSA Assay (FDA approved for NASAL specimens only), is one component of a comprehensive MRSA colonization surveillance program. It is not intended to diagnose MRSA infection nor to guide or monitor treatment for MRSA infections.      Labs: Basic Metabolic Panel:  Recent Labs Lab 11/20/16 0723 11/21/16 0551 11/23/16 0636 11/24/16 0418 11/25/16 0430  NA 138 135 135 137 133*  K 4.3 3.8 4.2 4.0 4.1  CL 93* 91* 92* 96* 95*  CO2 22 21* 19* 21* 23  GLUCOSE 92 73 93 78 86  BUN 47* 21* 43* 18 28*  CREATININE 13.89* 9.25* 14.79* 9.99* 12.08*  CALCIUM 8.0* 8.4* 7.2* 7.6* 7.0*   Liver Function Tests:  Recent Labs Lab 11/18/16 2211 11/19/16 0742 11/20/16 0723 11/21/16 0551 11/25/16 0430  AST 17 15 15 20 28   ALT 19 19 17 20  10*  ALKPHOS 70 73 58 54 42  BILITOT 1.4* 1.3* 1.1 2.0* 0.6  PROT 9.1* 9.4* 8.2* 8.1 6.1*  ALBUMIN 4.5 4.6 4.1 3.9 2.9*    Recent Labs Lab 11/18/16 2211  LIPASE 25   No results for input(s): AMMONIA in the last 168 hours. CBC:  Recent Labs Lab 11/19/16 0742 11/20/16 0723 11/21/16 0551 11/23/16 0636 11/24/16 0418 11/25/16 0430  WBC 11.8* 11.8* 10.1 8.7 9.6 7.2  NEUTROABS 7.7  --   --   --   --   --   HGB 15.1 13.4 13.8 12.6* 12.4* 10.5*  HCT 43.7 39.2 40.9 37.2* 37.0* 31.0*  MCV 90.7 90.1 91.9 89.0 89.8 90.6  PLT 205 199 165 142* 154 139*   Cardiac Enzymes: No results for input(s): CKTOTAL, CKMB, CKMBINDEX, TROPONINI  in the last 168 hours. BNP: BNP (last 3 results) No results for input(s): BNP in the last 8760 hours.  ProBNP (last 3 results) No results for input(s): PROBNP in the last 8760 hours.  CBG: No results for input(s): GLUCAP in the last 168 hours.  Signed:  Velvet Bathe MD.  [facility] 11/25/2016, 2:48 PM

## 2016-11-25 NOTE — Progress Notes (Signed)
Patient ID: [name], male   DOB: August 24, 1985, 31 y.o.   MRN: [id number]  [facility] Progress Note   Assessment/ Plan:   1. Abdominal pain/nausea/vomiting:  Postoperative day #2 Status post laparoscopic cholecystectomy with intraoperative cholangiogram. Having some pain over the laparoscopic port sites but otherwise doing well and tolerating oral intake. Encouraged ambulation. 2. ESRD: continue MWF HD- undergoing hemodialysis at this time without problems. We'll continue daily assessment for acute needs. 3. Anemia:Hgb at goal -- not on ESA. Postoperative drop noted. 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 Nutrition: Tolerating oral intake, continue to monitor albumin levels 6. Hypertension: Blood pressure under acceptable control at this time, monitor with ultrafiltration and hemodialysis  Subjective:   Reports some tolerable abdominal pain over the laparoscopic port sites and ambulated hallways yesterday with some assistance.    Objective:   BP (!) 90/48   Pulse 82   Temp 97.5 F (36.4 C) (Oral)   Resp 14   Ht 5\' 6"  (1.676 m)   Wt (!) 143.7 kg (316 lb 12.8 oz)   SpO2 96%   BMI 51.13 kg/m   Physical Exam: WIO:[id number] resting in Hemodialysis GTX:MIWOE regular rhythm, normal rate Resp:clear bilaterally, no rales/rhonchi HOZ:YYQM, obese, some tenderness over upper quadrants primarily right side noted  Ext:No LE edema noted  Labs: BMET  Recent Labs Lab 11/18/16 2211 11/19/16 0742 11/20/16 0723 11/21/16 0551 11/23/16 0636 11/24/16 0418 11/25/16 0430  NA 131* 135 138 135 135 137 133*  K 3.7 4.2 4.3 3.8 4.2 4.0 4.1  CL 91* 88* 93* 91* 92* 96* 95*  CO2 20* 25 22 21* 19* 21* 23  GLUCOSE 106* 90 92 73 93 78 86  BUN 21* 27* 47* 21* 43* 18 28*  CREATININE 8.53* 9.92* 13.89* 9.25* 14.79* 9.99* 12.08*  CALCIUM 8.8* 8.9 8.0* 8.4* 7.2* 7.6* 7.0*   CBC  Recent Labs Lab 11/19/16 0742  11/21/16 0551  11/23/16 0636 11/24/16 0418 11/25/16 0430  WBC 11.8*  < > 10.1 8.7 9.6 7.2  NEUTROABS 7.7  --   --   --   --   --   HGB 15.1  < > 13.8 12.6* 12.4* 10.5*  HCT 43.7  < > 40.9 37.2* 37.0* 31.0*  MCV 90.7  < > 91.9 89.0 89.8 90.6  PLT 205  < > 165 142* 154 139*  < > = values in this interval not displayed. Medications:    . acetaminophen  650 mg Oral Q6H  . doxercalciferol  2 mcg Intravenous Q M,W,F-HD  . metoCLOPramide  5 mg Oral TID AC & HS  . pantoprazole  40 mg Oral BID  . sevelamer carbonate  2.4 g Oral TID WC   Elmarie Shiley, MD 11/25/2016, 8:37 AM

## 2016-11-25 NOTE — Progress Notes (Signed)
0755 Pt to Hemodialysis via bed, A&O x4, denies pain.

## 2016-11-25 NOTE — Procedures (Signed)
Patient seen on Hemodialysis. QB 400, UF goal 2.5L Treatment adjusted as needed.  Elmarie Shiley MD [facility]. Office # [phone] Pager # [phone] 8:36 AM

## 2016-11-25 NOTE — Progress Notes (Signed)
Pt was ambulating to the hall. Tolerating renal diet. Surgical pain controlled. Discharge instructions given to pt. Verbalized understanding. Discharged to home accompanied by family.

## 2016-12-05 ENCOUNTER — Emergency Department (HOSPITAL_COMMUNITY)
Admission: EM | Admit: 2016-12-05 | Discharge: 2016-12-05 | Disposition: A | Discharge status: Home / Self Care | Payer: Medicare Other | Attending: Emergency Medicine | Admitting: Emergency Medicine

## 2016-12-05 ENCOUNTER — Encounter (HOSPITAL_COMMUNITY): Payer: Self-pay

## 2016-12-05 ENCOUNTER — Emergency Department (HOSPITAL_COMMUNITY): Payer: Medicare Other

## 2016-12-05 DIAGNOSIS — N186 End stage renal disease: Secondary | ICD-10-CM | POA: Insufficient documentation

## 2016-12-05 DIAGNOSIS — I120 Hypertensive chronic kidney disease with stage 5 chronic kidney disease or end stage renal disease: Secondary | ICD-10-CM | POA: Diagnosis not present

## 2016-12-05 DIAGNOSIS — F17210 Nicotine dependence, cigarettes, uncomplicated: Secondary | ICD-10-CM | POA: Diagnosis not present

## 2016-12-05 DIAGNOSIS — G8918 Other acute postprocedural pain: Secondary | ICD-10-CM | POA: Insufficient documentation

## 2016-12-05 DIAGNOSIS — R1011 Right upper quadrant pain: Secondary | ICD-10-CM | POA: Insufficient documentation

## 2016-12-05 DIAGNOSIS — Z96652 Presence of left artificial knee joint: Secondary | ICD-10-CM | POA: Diagnosis not present

## 2016-12-05 DIAGNOSIS — Z992 Dependence on renal dialysis: Secondary | ICD-10-CM | POA: Insufficient documentation

## 2016-12-05 DIAGNOSIS — R112 Nausea with vomiting, unspecified: Secondary | ICD-10-CM | POA: Diagnosis not present

## 2016-12-05 LAB — COMPREHENSIVE METABOLIC PANEL
ALT: 17 U/L (ref 17–63)
AST: 29 U/L (ref 15–41)
Albumin: 4.1 g/dL (ref 3.5–5.0)
Alkaline Phosphatase: 57 U/L (ref 38–126)
Anion gap: 19 — ABNORMAL HIGH (ref 5–15)
BUN: 31 mg/dL — ABNORMAL HIGH (ref 6–20)
CO2: 22 mmol/L (ref 22–32)
Calcium: 9 mg/dL (ref 8.9–10.3)
Chloride: 95 mmol/L — ABNORMAL LOW (ref 101–111)
Creatinine, Ser: 8.98 mg/dL — ABNORMAL HIGH (ref 0.61–1.24)
GFR calc Af Amer: 8 mL/min — ABNORMAL LOW (ref >60)
GFR calc non Af Amer: 7 mL/min — ABNORMAL LOW (ref >60)
Glucose, Bld: 120 mg/dL — ABNORMAL HIGH (ref 65–99)
Potassium: 4 mmol/L (ref 3.5–5.1)
Sodium: 136 mmol/L (ref 135–145)
Total Bilirubin: 1.1 mg/dL (ref 0.3–1.2)
Total Protein: 8.1 g/dL (ref 6.5–8.1)

## 2016-12-05 LAB — CBC WITH DIFFERENTIAL/PLATELET
Basophils Absolute: 0 10*3/uL (ref 0.0–0.1)
Basophils Relative: 0 %
Eosinophils Absolute: 0.1 10*3/uL (ref 0.0–0.7)
Eosinophils Relative: 1 %
HCT: 39.9 % (ref 39.0–52.0)
Hemoglobin: 13.6 g/dL (ref 13.0–17.0)
Lymphocytes Relative: 14 %
Lymphs Abs: 1.5 10*3/uL (ref 0.7–4.0)
MCH: 31 pg (ref 26.0–34.0)
MCHC: 34.1 g/dL (ref 30.0–36.0)
MCV: 90.9 fL (ref 78.0–100.0)
Monocytes Absolute: 0.6 10*3/uL (ref 0.1–1.0)
Monocytes Relative: 6 %
Neutro Abs: 8.3 10*3/uL — ABNORMAL HIGH (ref 1.7–7.7)
Neutrophils Relative %: 79 %
Platelets: 210 10*3/uL (ref 150–400)
RBC: 4.39 MIL/uL (ref 4.22–5.81)
RDW: 14 % (ref 11.5–15.5)
WBC: 10.4 10*3/uL (ref 4.0–10.5)

## 2016-12-05 LAB — I-STAT CG4 LACTIC ACID, ED
Lactic Acid, Venous: 2.98 mmol/L (ref 0.5–1.9)
Lactic Acid, Venous: 0.83 mmol/L (ref 0.5–1.9)

## 2016-12-05 LAB — LIPASE, BLOOD: Lipase: 29 U/L (ref 11–51)

## 2016-12-05 MED ORDER — MORPHINE SULFATE (PF) 4 MG/ML IV SOLN
4.0000 mg | Freq: Once | INTRAVENOUS | Status: AC
  Administered 2016-12-05: 4 mg via INTRAVENOUS
  Filled 2016-12-05: qty 1

## 2016-12-05 MED ORDER — IOPAMIDOL (ISOVUE-300) INJECTION 61%
INTRAVENOUS | Status: AC
  Administered 2016-12-05: 100 mL
  Filled 2016-12-05: qty 100

## 2016-12-05 MED ORDER — HYDROCODONE-ACETAMINOPHEN 5-325 MG PO TABS: 1.0000 | ORAL_TABLET | Freq: Four times a day (QID) | ORAL | 0 refills | Status: DC | PRN

## 2016-12-05 MED ORDER — SODIUM CHLORIDE 0.9 % IV BOLUS (SEPSIS)
500.0000 mL | Freq: Once | INTRAVENOUS | Status: AC
  Administered 2016-12-05: 500 mL via INTRAVENOUS

## 2016-12-05 MED ORDER — ONDANSETRON HCL 4 MG/2ML IJ SOLN
4.0000 mg | Freq: Once | INTRAMUSCULAR | Status: AC
Start: 2016-12-05 — End: 2016-12-05
  Administered 2016-12-05: 4 mg via INTRAVENOUS
  Filled 2016-12-05: qty 2

## 2016-12-05 MED ORDER — ONDANSETRON HCL 4 MG PO TABS: 4.0000 mg | ORAL_TABLET | Freq: Four times a day (QID) | ORAL | 0 refills | Status: DC

## 2016-12-05 NOTE — ED Provider Notes (Signed)
[facility] DEPT Provider Note   CSN: [id number] Arrival date & time: 12/05/16  0907     History   Chief Complaint Chief Complaint  Patient presents with  . Abdominal Pain  . Emesis    HPI [name] is a 31 y.o. male with history of ESRD on dialysis Monday Wednesday Friday, obesity, hypertension, recent cholecystectomy who presents with sudden onset right upper quadrant pain associated with nausea and vomiting. Patient states he began with symptoms around 12:30 AM this morning. Patient states he has vomited around 12 times. Denies any hematemesis. Patient states he has pain over his right upper quadrant where he had surgery. Patient denies any diarrhea. He denies fevers, chest pain, shortness of breath. Patient does not make urine. Patient has not taken any medications at home for his symptoms.  HPI  Past Medical History:  Diagnosis Date  . Arthritis    "all over" (11/19/2016)  . Chronic lower back pain   . Complication of anesthesia    A little while to wake up after knee surgery in 2008  . Dizziness    when coming off of dialysis  . ESRD (end stage renal disease) on dialysis [facility])    MWF and goes to [facility] (11/19/2016)  . Family history of anesthesia complication    mom slow to wake up  . GERD (gastroesophageal reflux disease)    takes Omeprazole as needed  . HYIFOYDX(412.8)    "monthly" (11/19/2016)  . History of hiatal hernia   . Hyperparathyroidism ([place])   . Hypertension   . Joint pain   . Joint swelling   . Morbid obesity ([place])   . PONV (postoperative nausea and vomiting)   . Renal insufficiency    Pt is on dyalisis and has been for 14 years    Patient Active Problem List   Diagnosis Date Noted  . RUQ abdominal pain 11/20/2016  . RUQ pain 11/19/2016  . Gastritis 11/19/2016  . Increased anion gap metabolic acidosis 78/67/6720  . Intractable abdominal pain   . Common bile duct dilatation   . Cholelithiasis without cholecystitis   . ??  Gastroparesis- NORMAL NM gastric emptying study 2016   . Intractable nausea and vomiting 10/08/2016  . Hematemesis 08/14/2016  . GERD (gastroesophageal reflux disease) 08/14/2016  . Essential hypertension 08/14/2016  . GIB (gastrointestinal bleeding) 08/14/2016  . SIRS (systemic inflammatory response syndrome) ([place]) 08/14/2016  . CKD (chronic kidney disease) stage V requiring chronic dialysis ([place]) 06/11/2016  . Non-intractable vomiting with nausea 06/11/2016  . Hepatic steatosis 06/11/2016  . Cholelithiasis 06/11/2016  . Localized osteoarthritis of left knee 07/11/2015  . Abdominal pain, epigastric 12/05/2014  . Lapband April 2016 11/27/2014  . Arthritis of knee 03/27/2014  . Morbid obesity, weight 291, BMI - 47 02/15/2014  . Hyperparathyroidism, secondary ([place]) 03/14/2013    Past Surgical History:  Procedure Laterality Date  . AV FISTULA PLACEMENT Bilateral 2005,2007   "right; left"  . AV FISTULA REPAIR Right 2007   "tried to clean it out; couldn't do it"  . BREATH TEK H PYLORI N/A 05/15/2014   Procedure: BREATH TEK H PYLORI;  Surgeon: Alphonsa Overall, MD;  Location: [facility];  Service: General;  Laterality: N/A;  . CHOLECYSTECTOMY N/A 11/23/2016   Procedure: LAPAROSCOPIC CHOLECYSTECTOMY WITH INTRAOPERATIVE CHOLANGIOGRAM;  Surgeon: Erroll Luna, MD;  Location: [place] [facility];  Service: General;  Laterality: N/A;  . ESOPHAGOGASTRODUODENOSCOPY N/A 12/07/2014   Procedure: ESOPHAGOGASTRODUODENOSCOPY (EGD);  Surgeon: Alphonsa Overall, MD;  Location: [facility];  Service:  General;  Laterality: N/A;  . ESOPHAGOGASTRODUODENOSCOPY N/A 12/17/2014   Procedure: ESOPHAGOGASTRODUODENOSCOPY (EGD);  Surgeon: Alphonsa Overall, MD;  Location: [facility];  Service: General;  Laterality: N/A;  . ESOPHAGOGASTRODUODENOSCOPY N/A 08/16/2016   Procedure: ESOPHAGOGASTRODUODENOSCOPY (EGD);  Surgeon: Jerene Bears, MD;  Location: [facility];  Service: Gastroenterology;  Laterality: N/A;  . HERNIA REPAIR  11/2014   w/lap band  OR  . JOINT REPLACEMENT    . KNEE ARTHROSCOPY Left 2007  . LAPAROSCOPIC GASTRIC BANDING N/A 11/12/2014   Procedure: LAPAROSCOPIC GASTRIC BANDING;  Surgeon: Alphonsa Overall, MD;  Location: [facility];  Service: General;  Laterality: N/A;  . PARATHYROIDECTOMY N/A 03/30/2013   Procedure: TOTAL PARATHYROIDECTOMY WITH AUTOTRANSPLANT;  Surgeon: Earnstine Regal, MD;  Location: [facility];  Service: General;  Laterality: N/A;  Autotransplant to left lower arm.  Marland Kitchen TOTAL KNEE ARTHROPLASTY Right 03/27/2014   Procedure: UNICOMPARTMENTAL ARTHROPLASTY;  Surgeon: Meredith Pel, MD;  Location: [place];  Service: Orthopedics;  Laterality: Right;       Home Medications    Prior to Admission medications   Medication Sig Start Date End Date Taking? Authorizing Provider  albuterol (PROVENTIL HFA;VENTOLIN HFA) 108 (90 Base) MCG/ACT inhaler Inhale 2 puffs into the lungs every 4 (four) hours as needed for wheezing or shortness of breath. 05/14/16  Yes Oxford, Orson Ape, [name]  calcium carbonate (TUMS - DOSED IN MG ELEMENTAL CALCIUM) 500 MG chewable tablet Chew 2 tablets by mouth 2 (two) times daily as needed for indigestion or heartburn.    Yes [provider]  glycopyrrolate (ROBINUL) 2 MG tablet Take 1 tablet (2 mg total) by mouth 2 (two) times daily. 01/25/15  Yes Inda Castle, MD  Hyoscyamine Sulfate 0.375 MG TBCR Take 1 tablet twice daily Patient taking differently: Take 0.375 mg by mouth 2 (two) times daily.  01/22/15  Yes Inda Castle, MD  lidocaine (XYLOCAINE) 2 % jelly Apply 1 application topically as needed. Patient taking differently: Apply 1 application topically daily as needed (pain).  02/19/16  Yes Cartner, Marland Kitchen, [name]  metoCLOPramide (REGLAN) 5 MG tablet Take 1 tablet (5 mg total) by mouth 2 (two) times daily before a meal. 10/09/16  Yes Patrecia Pour, MD  multivitamin (RENA-VIT) TABS tablet Take 1 tablet by mouth daily.   Yes [provider]  omeprazole (PRILOSEC) 20 MG capsule Take 1  capsule (20 mg total) by mouth daily. 07/27/16  Yes Rancour, Annie Main, MD  sevelamer carbonate (RENVELA) 2.4 G PACK Take 2.4 g by mouth 3 (three) times daily with meals.    Yes [provider]  traMADol (ULTRAM) 50 MG tablet Take 1 tablet (50 mg total) by mouth every 8 (eight) hours as needed. Patient taking differently: Take 50 mg by mouth every 6 (six) hours as needed (for pain).  06/15/16  Yes Theodis Blaze, MD  HYDROcodone-acetaminophen (NORCO/VICODIN) 5-325 MG tablet Take 1-2 tablets by mouth every 6 (six) hours as needed for severe pain. 12/05/16   Samarrah Tranchina, Bea Graff, [name]  methocarbamol (ROBAXIN) 500 MG tablet Take 1 tablet (500 mg total) by mouth every 8 (eight) hours as needed for muscle spasms. Patient not taking: Reported on 12/05/2016 06/15/16   Theodis Blaze, MD  ondansetron (ZOFRAN) 4 MG tablet Take 1 tablet (4 mg total) by mouth every 6 (six) hours. 12/05/16   Frederica Kuster, [name]    Family History Family History  Problem Relation Age of Onset  . Hypertension Mother   . Diabetes Father   .  Kidney Stones Sister   . Diabetes Maternal Grandmother   . Liver cancer Maternal Uncle     Social History Social History  Substance Use Topics  . Smoking status: Current Every Day Smoker    Packs/day: 0.10    Years: 16.00    Types: Cigarettes    Last attempt to quit: 12/17/2014  . Smokeless tobacco: Never Used  . Alcohol use No     Allergies   Dilaudid [hydromorphone hcl]   Review of Systems Review of Systems  Constitutional: Negative for chills and fever.  HENT: Negative for facial swelling and sore throat.   Respiratory: Negative for shortness of breath.   Cardiovascular: Negative for chest pain.  Gastrointestinal: Positive for abdominal pain, nausea and vomiting.  Genitourinary: Negative for dysuria.  Musculoskeletal: Negative for back pain.  Skin: Negative for rash and wound.  Neurological: Negative for headaches.  Psychiatric/Behavioral: The patient is not  nervous/anxious.      Physical Exam Updated Vital Signs BP (!) 100/55   Pulse (!) 103   Temp 98.9 F (37.2 C) (Oral)   Resp 16   Wt (!) 140.6 kg   SpO2 96%   BMI 50.04 kg/m   Physical Exam  Constitutional: He appears well-developed and well-nourished. No distress.  HENT:  Head: Normocephalic and atraumatic.  Mouth/Throat: Oropharynx is clear and moist. No oropharyngeal exudate.  Eyes: Conjunctivae are normal. Pupils are equal, round, and reactive to light. Right eye exhibits no discharge. Left eye exhibits no discharge. No scleral icterus.  Neck: Normal range of motion. Neck supple. No thyromegaly present.  Cardiovascular: Regular rhythm, normal heart sounds and intact distal pulses.  Tachycardia present.  Exam reveals no gallop and no friction rub.   No murmur heard. Pulmonary/Chest: Effort normal and breath sounds normal. No stridor. No respiratory distress. He has no wheezes. He has no rales.  Abdominal: Soft. Bowel sounds are normal. He exhibits no distension. There is tenderness in the right upper quadrant, epigastric area and left upper quadrant. There is positive Murphy's sign. There is no rebound and no guarding.  Musculoskeletal: He exhibits no edema.  Lymphadenopathy:    He has no cervical adenopathy.  Neurological: He is alert. Coordination normal.  Skin: Skin is warm and dry. No rash noted. He is not diaphoretic. No pallor.  Psychiatric: He has a normal mood and affect.  Nursing note and vitals reviewed.    ED Treatments / Results  Labs (all labs ordered are listed, but only abnormal results are displayed) Labs Reviewed  COMPREHENSIVE METABOLIC PANEL - Abnormal; Notable for the following:       Result Value   Chloride 95 (*)    Glucose, Bld 120 (*)    BUN 31 (*)    Creatinine, Ser 8.98 (*)    GFR calc non Af Amer 7 (*)    GFR calc Af Amer 8 (*)    Anion gap 19 (*)    All other components within normal limits  CBC WITH DIFFERENTIAL/PLATELET - Abnormal;  Notable for the following:    Neutro Abs 8.3 (*)    All other components within normal limits  I-STAT CG4 LACTIC ACID, ED - Abnormal; Notable for the following:    Lactic Acid, Venous 2.98 (*)    All other components within normal limits  CULTURE, BLOOD (ROUTINE X 2)  CULTURE, BLOOD (ROUTINE X 2)  LIPASE, BLOOD  I-STAT CG4 LACTIC ACID, ED    EKG  EKG Interpretation None  Radiology Ct Abdomen Pelvis W Contrast  Result Date: 12/05/2016 CLINICAL DATA:  Status post cholecystectomy 11/23/2016. Right upper quadrant abdominal pain. End-stage renal disease on hemodialysis. EXAM: CT ABDOMEN AND PELVIS WITH CONTRAST TECHNIQUE: Multidetector CT imaging of the abdomen and pelvis was performed using the standard protocol following bolus administration of intravenous contrast. CONTRAST:  15mL ISOVUE-300 IOPAMIDOL (ISOVUE-300) INJECTION 61% COMPARISON:  08/13/2016 CT abdomen/ pelvis.  11/19/2016 MRI abdomen. FINDINGS: Image quality is degraded by patient body habitus, particularly in the lower abdomen and pelvis. Lower chest: Right lower lobe 3 mm solid pulmonary nodule (series 4/image 7), stable since at least 12/27/2014, considered benign. Hepatobiliary: Normal liver with no liver mass. Cholecystectomy. No fluid collections in the cholecystectomy bed. No intrahepatic biliary ductal dilatation. Common bile duct diameter 6 mm, within expected post cholecystectomy limits. No radiopaque choledocholithiasis. Pancreas: Normal, with no mass or duct dilation. Spleen: Normal size. No mass. Adrenals/Urinary Tract: Normal adrenals. No hydronephrosis. No renal stones. Symmetric severe renal parenchymal atrophy. Numerous renal cysts in both kidneys, a few of which demonstrate punctate mural calcifications in, and several of which are subcentimeter and too small to accurately characterize. The largest renal cyst measures 3.5 cm in the posterior interpolar left kidney. Collapsed and grossly normal bladder.  Stomach/Bowel: Grossly normal stomach. Normal caliber small bowel with no small bowel wall thickening. Normal appendix. Normal large bowel with no diverticulosis, large bowel wall thickening or pericolonic fat stranding. Vascular/Lymphatic: Normal caliber abdominal aorta. Patent portal, splenic, hepatic and diminutive renal veins. No pathologically enlarged lymph nodes in the abdomen or pelvis. Reproductive: Normal size prostate. Other: No pneumoperitoneum, ascites or focal fluid collection. Musculoskeletal: No aggressive appearing focal osseous lesions. IMPRESSION: 1. No acute abnormality. No fluid collections in the cholecystectomy bed. Bile ducts are within expected post cholecystectomy limits with common bile duct diameter 6 mm. No radiopaque choledocholithiasis. 2. No evidence of bowel obstruction or acute bowel inflammation. Normal appendix. 3. Polycystic atrophic kidneys. Electronically Signed   By: Ilona Sorrel M.D.   On: 12/05/2016 12:32   Dg Chest Port 1 View  Result Date: 12/05/2016 CLINICAL DATA:  Chest pain.  Abdominal pain, nausea and vomiting. EXAM: PORTABLE CHEST 1 VIEW COMPARISON:  10/08/2016 FINDINGS: The heart size and mediastinal contours are within normal limits. There is mild bibasilar atelectasis. There is no evidence of pulmonary edema, consolidation, pneumothorax, nodule or pleural fluid. The visualized skeletal structures are unremarkable. IMPRESSION: No active disease. Electronically Signed   By: Aletta Edouard M.D.   On: 12/05/2016 10:22    Procedures Procedures (including critical care time)  Medications Ordered in ED Medications  morphine 4 MG/ML injection 4 mg (4 mg Intravenous Given 12/05/16 0943)  ondansetron (ZOFRAN) injection 4 mg (4 mg Intravenous Given 12/05/16 0943)  sodium chloride 0.9 % bolus 500 mL (0 mLs Intravenous Stopped 12/05/16 1146)  iopamidol (ISOVUE-300) 61 % injection (100 mLs  Contrast Given 12/05/16 1155)  morphine 4 MG/ML injection 4 mg (4 mg Intravenous  Given 12/05/16 1144)     Initial Impression / Assessment and Plan / ED Course  I have reviewed the triage vital signs and the nursing notes.  Pertinent labs & imaging results that were available during my care of the patient were reviewed by me and considered in my medical decision making (see chart for details).     Patient with right upper quadrant pain status post cholecystectomy on 11/23/2016. CT abdomen pelvis shows no acute abnormality. CBC is unremarkable. CMP shows chloride 95, BUN 31, creatinine 8.98. (  Dr. Sabra Heck spoke with nephrology prior to CT abdomen pelvis with contrast revealed clearance). Lipase 29. Lactate cleared with 500 mL of fluid. Pain is controlled with morphine in the ED. Nausea controlled with Zofran. As patient is feeling much better and drinking chicken broth prior to discharge. Patient ambulated without any complaints with stable vitals prior to discharge. Patient to follow up with PCP and surgeon as soon as possible for further evaluation. Return precautions discussed. Patient understands and agrees to plan. Patient also evaluated by Dr. Sabra Heck who guided the patient's management and agrees with plan.  Final Clinical Impressions(s) / ED Diagnoses   Final diagnoses:  RUQ abdominal pain  Non-intractable vomiting with nausea, unspecified vomiting type  Post-op pain    New Prescriptions New Prescriptions   HYDROCODONE-ACETAMINOPHEN (NORCO/VICODIN) 5-325 MG TABLET    Take 1-2 tablets by mouth every 6 (six) hours as needed for severe pain.   ONDANSETRON (ZOFRAN) 4 MG TABLET    Take 1 tablet (4 mg total) by mouth every 6 (six) hours.     Frederica Kuster, [name] 12/05/16 1603    Noemi Chapel, MD 12/06/16 1046

## 2016-12-05 NOTE — ED Notes (Signed)
Pt ambulated in hallway with no issues or complaints. 

## 2016-12-05 NOTE — ED Notes (Signed)
Pt placed on 2L while asleep for O2 sat in the upper 80"s

## 2016-12-05 NOTE — Discharge Instructions (Signed)
Medications: Norco, Zofran  Treatment: Take 1-2 Norco every 4-6 hours as needed for severe pain. You can take regular Tylenol for mild to moderate pain. Do not take Tylenol within or hours of taking Norco. Take Zofran every 6 hours as needed for nausea or vomiting.  Follow-up: Please follow-up with your primary care provider in 2 days for further evaluation and treatment of your symptoms. Please return to the emergency department if you develop any new or worsening symptoms including fever, worsening abdominal pain, intractable vomiting, or any other new or concerning symptoms.

## 2016-12-05 NOTE — ED Triage Notes (Signed)
Pt nausea and vomiting. Post surgery.

## 2016-12-10 LAB — CULTURE, BLOOD (ROUTINE X 2)
Culture: NO GROWTH
Culture: NO GROWTH
Special Requests: ADEQUATE
Special Requests: ADEQUATE

## 2016-12-17 ENCOUNTER — Encounter (HOSPITAL_COMMUNITY): Payer: Self-pay | Admitting: Emergency Medicine

## 2016-12-17 ENCOUNTER — Emergency Department (HOSPITAL_COMMUNITY)
Admission: EM | Admit: 2016-12-17 | Discharge: 2016-12-17 | Disposition: A | Discharge status: Home / Self Care | Payer: Medicare Other | Attending: Emergency Medicine | Admitting: Emergency Medicine

## 2016-12-17 DIAGNOSIS — Z79899 Other long term (current) drug therapy: Secondary | ICD-10-CM | POA: Insufficient documentation

## 2016-12-17 DIAGNOSIS — Z96651 Presence of right artificial knee joint: Secondary | ICD-10-CM | POA: Diagnosis not present

## 2016-12-17 DIAGNOSIS — R197 Diarrhea, unspecified: Secondary | ICD-10-CM | POA: Insufficient documentation

## 2016-12-17 DIAGNOSIS — R112 Nausea with vomiting, unspecified: Secondary | ICD-10-CM

## 2016-12-17 DIAGNOSIS — R1013 Epigastric pain: Secondary | ICD-10-CM | POA: Diagnosis not present

## 2016-12-17 DIAGNOSIS — N186 End stage renal disease: Secondary | ICD-10-CM | POA: Insufficient documentation

## 2016-12-17 DIAGNOSIS — I120 Hypertensive chronic kidney disease with stage 5 chronic kidney disease or end stage renal disease: Secondary | ICD-10-CM | POA: Diagnosis not present

## 2016-12-17 DIAGNOSIS — Z992 Dependence on renal dialysis: Secondary | ICD-10-CM | POA: Insufficient documentation

## 2016-12-17 DIAGNOSIS — F17210 Nicotine dependence, cigarettes, uncomplicated: Secondary | ICD-10-CM | POA: Insufficient documentation

## 2016-12-17 DIAGNOSIS — R1084 Generalized abdominal pain: Secondary | ICD-10-CM | POA: Diagnosis present

## 2016-12-17 LAB — COMPREHENSIVE METABOLIC PANEL
ALK PHOS: 74 U/L (ref 38–126)
ALT: 15 U/L — ABNORMAL LOW (ref 17–63)
ANION GAP: 23 — AB (ref 5–15)
AST: 14 U/L — AB (ref 15–41)
Albumin: 4.2 g/dL (ref 3.5–5.0)
BILIRUBIN TOTAL: 1.1 mg/dL (ref 0.3–1.2)
BUN: 21 mg/dL — ABNORMAL HIGH (ref 6–20)
CALCIUM: 8.8 mg/dL — AB (ref 8.9–10.3)
CO2: 24 mmol/L (ref 22–32)
Chloride: 92 mmol/L — ABNORMAL LOW (ref 101–111)
Creatinine, Ser: 9.48 mg/dL — ABNORMAL HIGH (ref 0.61–1.24)
GFR calc Af Amer: 7 mL/min — ABNORMAL LOW (ref >60)
GFR, EST NON AFRICAN AMERICAN: 6 mL/min — AB (ref >60)
Glucose, Bld: 94 mg/dL (ref 65–99)
POTASSIUM: 3.4 mmol/L — AB (ref 3.5–5.1)
Sodium: 139 mmol/L (ref 135–145)
TOTAL PROTEIN: 8.6 g/dL — AB (ref 6.5–8.1)

## 2016-12-17 LAB — CBC
HCT: 39.8 % (ref 39.0–52.0)
Hemoglobin: 13.3 g/dL (ref 13.0–17.0)
MCH: 30.7 pg (ref 26.0–34.0)
MCHC: 33.4 g/dL (ref 30.0–36.0)
MCV: 91.9 fL (ref 78.0–100.0)
Platelets: 240 10*3/uL (ref 150–400)
RBC: 4.33 MIL/uL (ref 4.22–5.81)
RDW: 14 % (ref 11.5–15.5)
WBC: 10.8 10*3/uL — AB (ref 4.0–10.5)

## 2016-12-17 LAB — LIPASE, BLOOD: Lipase: 24 U/L (ref 11–51)

## 2016-12-17 MED ORDER — GI COCKTAIL ~~LOC~~
30.0000 mL | Freq: Once | ORAL | Status: AC
  Administered 2016-12-17: 30 mL via ORAL
  Filled 2016-12-17: qty 30

## 2016-12-17 MED ORDER — MORPHINE SULFATE (PF) 4 MG/ML IV SOLN
4.0000 mg | Freq: Once | INTRAVENOUS | Status: AC
  Administered 2016-12-17: 4 mg via INTRAVENOUS
  Filled 2016-12-17: qty 1

## 2016-12-17 MED ORDER — HYDROCODONE-ACETAMINOPHEN 5-325 MG PO TABS: 1.0000 | ORAL_TABLET | Freq: Four times a day (QID) | ORAL | 0 refills | Status: DC | PRN

## 2016-12-17 MED ORDER — SODIUM CHLORIDE 0.9 % IV BOLUS (SEPSIS)
500.0000 mL | Freq: Once | INTRAVENOUS | Status: AC
  Administered 2016-12-17: 500 mL via INTRAVENOUS

## 2016-12-17 MED ORDER — ONDANSETRON HCL 4 MG/2ML IJ SOLN
4.0000 mg | Freq: Once | INTRAMUSCULAR | Status: AC
  Administered 2016-12-17: 4 mg via INTRAVENOUS
  Filled 2016-12-17: qty 2

## 2016-12-17 MED ORDER — ONDANSETRON 4 MG PO TBDP: 4.0000 mg | ORAL_TABLET | Freq: Three times a day (TID) | ORAL | 0 refills | Status: DC | PRN

## 2016-12-17 NOTE — ED Triage Notes (Signed)
Pt sts abd pain and vomiting starting last night; pt dialysis pt with last dialysis yesterday

## 2016-12-17 NOTE — Discharge Instructions (Signed)
Use Vicodin as needed for severe pain. Otherwise, may use Tylenol for pain. Zofran as needed for nausea/vomiting. Follow-up with gastroenterology and general surgery. Return to ED should symptoms worsen.

## 2016-12-17 NOTE — ED Provider Notes (Signed)
[place] DEPT Provider Note   CSN: [id number] Arrival date & time: 12/17/16  1048     History   Chief Complaint Chief Complaint  Patient presents with  . Abdominal Pain  . Emesis    HPI ALONSO GAPINSKI is a 31 y.o. male.  HPI    DARA CAMARGO is a 31 y.o. male, with a history of Morbid obesity, ESRD on dialysis, HTN, GERD, gastric band, cholecystectomy, presenting to the ED with abdominal pain, nausea, vomiting, and diarrhea beginning yesterday. Endorses about 15 episodes of emesis and one episode of diarrhea. Abdominal pain that is located in both the central lower and central upper abdominal regions, intermittent, sharp, rates it 10/10, sometimes feels like it starts in the lower abdomen and radiates to the upper abdomen. Has not taken anything for his pain. Last zofran was this morning at 7 AM. No vomiting since that time, but continues to feel nauseous. States this pain is similar to that on 5/5 when he was seen here in the ED. The pain does not seem to be associated with eating.   Denies fever/chills, hematemesis, hematochezia/melena, or any other complaints. Denies marijuana or other illicit drug use.   Dialysis Mon, Wed, Fri and states he has been compliant. Last dialysis yesterday. Gets dialysis at [facility] on [address] in [place]. Nephrologist: [facility], last saw Dr. Morton Peters chole performed by Dr. Brantley Stage on November 23, 2016. Went to follow up appointment 5/15, but wasn't having his current issue at that time. GI: Dr. Deatra Ina with [facility]    Past Medical History:  Diagnosis Date  . Arthritis    "all over" (11/19/2016)  . Chronic lower back pain   . Complication of anesthesia    A little while to wake up after knee surgery in 2008  . Dizziness    when coming off of dialysis  . ESRD (end stage renal disease) on dialysis [facility])    MWF and goes to [facility] (11/19/2016)  . Family history of anesthesia complication    mom slow to  wake up  . GERD (gastroesophageal reflux disease)    takes Omeprazole as needed  . MHDQQIWL(798.9)    "monthly" (11/19/2016)  . History of hiatal hernia   . Hyperparathyroidism ([place])   . Hypertension   . Joint pain   . Joint swelling   . Morbid obesity ([place])   . PONV (postoperative nausea and vomiting)   . Renal insufficiency    Pt is on dyalisis and has been for 14 years    Patient Active Problem List   Diagnosis Date Noted  . RUQ abdominal pain 11/20/2016  . RUQ pain 11/19/2016  . Gastritis 11/19/2016  . Increased anion gap metabolic acidosis 21/19/4174  . Intractable abdominal pain   . Common bile duct dilatation   . Cholelithiasis without cholecystitis   . ?? Gastroparesis- NORMAL NM gastric emptying study 2016   . Intractable nausea and vomiting 10/08/2016  . Hematemesis 08/14/2016  . GERD (gastroesophageal reflux disease) 08/14/2016  . Essential hypertension 08/14/2016  . GIB (gastrointestinal bleeding) 08/14/2016  . SIRS (systemic inflammatory response syndrome) ([place]) 08/14/2016  . CKD (chronic kidney disease) stage V requiring chronic dialysis ([place]) 06/11/2016  . Non-intractable vomiting with nausea 06/11/2016  . Hepatic steatosis 06/11/2016  . Cholelithiasis 06/11/2016  . Localized osteoarthritis of left knee 07/11/2015  . Abdominal pain, epigastric 12/05/2014  . Lapband April 2016 11/27/2014  . Arthritis of knee 03/27/2014  . Morbid obesity,  weight 291, BMI - 47 02/15/2014  . Hyperparathyroidism, secondary ([place]) 03/14/2013    Past Surgical History:  Procedure Laterality Date  . AV FISTULA PLACEMENT Bilateral 2005,2007   "right; left"  . AV FISTULA REPAIR Right 2007   "tried to clean it out; couldn't do it"  . BREATH TEK H PYLORI N/A 05/15/2014   Procedure: BREATH TEK H PYLORI;  Surgeon: Alphonsa Overall, MD;  Location: [facility];  Service: General;  Laterality: N/A;  . CHOLECYSTECTOMY N/A 11/23/2016   Procedure: LAPAROSCOPIC CHOLECYSTECTOMY WITH  INTRAOPERATIVE CHOLANGIOGRAM;  Surgeon: Erroll Luna, MD;  Location: [place];  Service: General;  Laterality: N/A;  . ESOPHAGOGASTRODUODENOSCOPY N/A 12/07/2014   Procedure: ESOPHAGOGASTRODUODENOSCOPY (EGD);  Surgeon: Alphonsa Overall, MD;  Location[facility];  Service: General;  Laterality: N/A;  . ESOPHAGOGASTRODUODENOSCOPY N/A 12/17/2014   Procedure: ESOPHAGOGASTRODUODENOSCOPY (EGD);  Surgeon: Alphonsa Overall, MD;  Location: [place];  Service: General;  Laterality: N/A;  . ESOPHAGOGASTRODUODENOSCOPY N/A 08/16/2016   Procedure: ESOPHAGOGASTRODUODENOSCOPY (EGD);  Surgeon: Jerene Bears, MD;  Location: [place];  Service: Gastroenterology;  Laterality: N/A;  . HERNIA REPAIR  11/2014   w/lap band OR  . JOINT REPLACEMENT    . KNEE ARTHROSCOPY Left 2007  . LAPAROSCOPIC GASTRIC BANDING N/A 11/12/2014   Procedure: LAPAROSCOPIC GASTRIC BANDING;  Surgeon: Alphonsa Overall, MD;  Location: [facility];  Service: General;  Laterality: N/A;  . PARATHYROIDECTOMY N/A 03/30/2013   Procedure: TOTAL PARATHYROIDECTOMY WITH AUTOTRANSPLANT;  Surgeon: Earnstine Regal, MD;  Location: [place];  Service: General;  Laterality: N/A;  Autotransplant to left lower arm.  Marland Kitchen TOTAL KNEE ARTHROPLASTY Right 03/27/2014   Procedure: UNICOMPARTMENTAL ARTHROPLASTY;  Surgeon: Meredith Pel, MD;  Location: [place];  Service: Orthopedics;  Laterality: Right;       Home Medications    Prior to Admission medications   Medication Sig Start Date End Date Taking? Authorizing Provider  albuterol (PROVENTIL HFA;VENTOLIN HFA) 108 (90 Base) MCG/ACT inhaler Inhale 2 puffs into the lungs every 4 (four) hours as needed for wheezing or shortness of breath. 05/14/16  Yes Oxford, Orson Ape, [name]  calcium carbonate (TUMS - DOSED IN MG ELEMENTAL CALCIUM) 500 MG chewable tablet Chew 2 tablets by mouth 2 (two) times daily as needed for indigestion or heartburn.    Yes [provider]  glycopyrrolate (ROBINUL) 2 MG tablet Take 1 tablet (2 mg total) by mouth 2  (two) times daily. 01/25/15  Yes Inda Castle, MD  HYDROcodone-acetaminophen (NORCO/VICODIN) 5-325 MG tablet Take 1-2 tablets by mouth every 6 (six) hours as needed for severe pain. 12/05/16  Yes Law, Alexandra M, [name]  Hyoscyamine Sulfate 0.375 MG TBCR Take 1 tablet twice daily Patient taking differently: Take 0.375 mg by mouth 2 (two) times daily.  01/22/15  Yes Inda Castle, MD  lidocaine (XYLOCAINE) 2 % jelly Apply 1 application topically as needed. Patient taking differently: Apply 1 application topically daily as needed (pain).  02/19/16  Yes Cartner, Marland Kitchen, [name]  methocarbamol (ROBAXIN) 500 MG tablet Take 1 tablet (500 mg total) by mouth every 8 (eight) hours as needed for muscle spasms. 06/15/16  Yes Theodis Blaze, MD  metoCLOPramide (REGLAN) 5 MG tablet Take 1 tablet (5 mg total) by mouth 2 (two) times daily before a meal. 10/09/16  Yes Patrecia Pour, MD  multivitamin (RENA-VIT) TABS tablet Take 1 tablet by mouth daily.   Yes [provider]  omeprazole (PRILOSEC) 20 MG capsule Take 1 capsule (20 mg total) by mouth daily. 07/27/16  Yes Rancour, Annie Main, MD  ondansetron (ZOFRAN) 4 MG tablet Take 1 tablet (4 mg total) by mouth every 6 (six) hours. 12/05/16  Yes Law, Bea Graff, [name]  sevelamer carbonate (RENVELA) 2.4 G PACK Take 2.4 g by mouth 3 (three) times daily with meals.    Yes [provider]  traMADol (ULTRAM) 50 MG tablet Take 1 tablet (50 mg total) by mouth every 8 (eight) hours as needed. Patient taking differently: Take 50 mg by mouth every 6 (six) hours as needed (for pain).  06/15/16  Yes Theodis Blaze, MD  HYDROcodone-acetaminophen (NORCO/VICODIN) 5-325 MG tablet Take 1 tablet by mouth every 6 (six) hours as needed. 12/17/16   Latana Colin C, [name]  ondansetron (ZOFRAN ODT) 4 MG disintegrating tablet Take 1 tablet (4 mg total) by mouth every 8 (eight) hours as needed for nausea or vomiting. 12/17/16   Estephania Licciardi, Helane Gunther, [name]    Family History Family History    Problem Relation Age of Onset  . Hypertension Mother   . Diabetes Father   . Kidney Stones Sister   . Diabetes Maternal Grandmother   . Liver cancer Maternal Uncle     Social History Social History  Substance Use Topics  . Smoking status: Current Every Day Smoker    Packs/day: 0.10    Years: 16.00    Types: Cigarettes    Last attempt to quit: 12/17/2014  . Smokeless tobacco: Never Used  . Alcohol use No     Allergies   Dilaudid [hydromorphone hcl]   Review of Systems Review of Systems  Constitutional: Negative for chills and fever.  Respiratory: Negative for shortness of breath.   Cardiovascular: Negative for chest pain.  Gastrointestinal: Positive for abdominal pain, diarrhea, nausea and vomiting. Negative for blood in stool.  Skin: Negative for rash.  All other systems reviewed and are negative.    Physical Exam Updated Vital Signs BP 111/66 (BP Location: Right Arm)   Pulse (!) 105   Temp 99.3 F (37.4 C) (Oral)   Resp 18   SpO2 98%   Physical Exam  Constitutional: He appears well-developed and well-nourished.  Morbidly obese. Appears uncomfortable.  HENT:  Head: Normocephalic and atraumatic.  Eyes: Conjunctivae are normal.  Neck: Neck supple.  Cardiovascular: Normal rate, regular rhythm, normal heart sounds and intact distal pulses.   Pulmonary/Chest: Effort normal and breath sounds normal. No respiratory distress.  Abdominal: Soft. There is tenderness in the epigastric area. There is no guarding.  Musculoskeletal: He exhibits no edema.  Lymphadenopathy:    He has no cervical adenopathy.  Neurological: He is alert.  Skin: Skin is warm and dry. He is not diaphoretic.  Psychiatric: He has a normal mood and affect. His behavior is normal.  Nursing note and vitals reviewed.    ED Treatments / Results  Labs (all labs ordered are listed, but only abnormal results are displayed) Labs Reviewed  COMPREHENSIVE METABOLIC PANEL - Abnormal; Notable for the  following:       Result Value   Potassium 3.4 (*)    Chloride 92 (*)    BUN 21 (*)    Creatinine, Ser 9.48 (*)    Calcium 8.8 (*)    Total Protein 8.6 (*)    AST 14 (*)    ALT 15 (*)    GFR calc non Af Amer 6 (*)    GFR calc Af Amer 7 (*)    Anion gap 23 (*)    All other components within normal  limits  CBC - Abnormal; Notable for the following:    WBC 10.8 (*)    All other components within normal limits  LIPASE, BLOOD    EKG  EKG Interpretation None       Radiology No results found.  Procedures Procedures (including critical care time)  Medications Ordered in ED Medications  morphine 4 MG/ML injection 4 mg (4 mg Intravenous Given 12/17/16 1601)  ondansetron (ZOFRAN) injection 4 mg (4 mg Intravenous Given 12/17/16 1601)  sodium chloride 0.9 % bolus 500 mL (0 mLs Intravenous Stopped 12/17/16 1736)  gi cocktail (Maalox,Lidocaine,Donnatal) (30 mLs Oral Given 12/17/16 1635)  ondansetron (ZOFRAN) injection 4 mg (4 mg Intravenous Given 12/17/16 1646)  morphine 4 MG/ML injection 4 mg (4 mg Intravenous Given 12/17/16 1759)     Initial Impression / Assessment and Plan / ED Course  I have reviewed the triage vital signs and the nursing notes.  Pertinent labs & imaging results that were available during my care of the patient were reviewed by me and considered in my medical decision making (see chart for details).  Clinical Course as of Dec 17 1849  Thu Dec 17, 2016  1609 Patient now rates his pain at 5/10. Nausea has resolved.  [SJ]  3903 Patient's pain continues to improve. Nausea still resolved. No vomiting. Ready to go home. Requests morphine prior to discharge.  [SJ]    Clinical Course User Index [SJ] Cecil Bixby C, [name]    Patient presents with abdominal pain, nausea, vomiting, and diarrhea. Patient's symptoms resolved with moderate intervention. Patient nontender upon reexamination after pain control. GI/general surgery follow-up. CT the abdomen not repeated due to  recent normal CT. The patient was given instructions for home care as well as return precautions. Patient voices understanding of these instructions, accepts the plan, and is comfortable with discharge.   Findings and plan of care discussed with Sherwood Gambler, MD. Dr. Regenia Skeeter personally evaluated and examined this patient.  Vitals:   12/17/16 1630 12/17/16 1635 12/17/16 1745 12/17/16 1815  BP: 105/68 105/68 (!) 111/59 111/71  Pulse: 98 100 97 (!) 101  Resp:  18  18  Temp:      TempSrc:      SpO2: 95% 94% 100% 97%     Final Clinical Impressions(s) / ED Diagnoses   Final diagnoses:  Epigastric pain  Nausea vomiting and diarrhea    New Prescriptions Discharge Medication List as of 12/17/2016  5:51 PM    START taking these medications   Details  !! HYDROcodone-acetaminophen (NORCO/VICODIN) 5-325 MG tablet Take 1 tablet by mouth every 6 (six) hours as needed., Starting Thu 12/17/2016, Print    ondansetron (ZOFRAN ODT) 4 MG disintegrating tablet Take 1 tablet (4 mg total) by mouth every 8 (eight) hours as needed for nausea or vomiting., Starting Thu 12/17/2016, Print     !! - Potential duplicate medications found. Please discuss with provider.       Lorayne Bender, [name] 12/17/16 1851    Sherwood Gambler, MD 12/18/16 [phone]

## 2016-12-17 NOTE — ED Notes (Signed)
Name called for room assignment - no answer x 3

## 2016-12-23 ENCOUNTER — Emergency Department (HOSPITAL_COMMUNITY)
Admission: EM | Admit: 2016-12-23 | Discharge: 2016-12-24 | Disposition: A | Discharge status: Home / Self Care | Payer: Medicare Other | Attending: Emergency Medicine | Admitting: Emergency Medicine

## 2016-12-23 ENCOUNTER — Emergency Department (HOSPITAL_COMMUNITY): Payer: Medicare Other

## 2016-12-23 ENCOUNTER — Encounter (HOSPITAL_COMMUNITY): Payer: Self-pay | Admitting: Emergency Medicine

## 2016-12-23 ENCOUNTER — Other Ambulatory Visit: Payer: Self-pay

## 2016-12-23 DIAGNOSIS — Z992 Dependence on renal dialysis: Secondary | ICD-10-CM | POA: Diagnosis not present

## 2016-12-23 DIAGNOSIS — Z79899 Other long term (current) drug therapy: Secondary | ICD-10-CM | POA: Insufficient documentation

## 2016-12-23 DIAGNOSIS — Z96651 Presence of right artificial knee joint: Secondary | ICD-10-CM | POA: Insufficient documentation

## 2016-12-23 DIAGNOSIS — N186 End stage renal disease: Secondary | ICD-10-CM | POA: Insufficient documentation

## 2016-12-23 DIAGNOSIS — F17210 Nicotine dependence, cigarettes, uncomplicated: Secondary | ICD-10-CM | POA: Insufficient documentation

## 2016-12-23 DIAGNOSIS — R1010 Upper abdominal pain, unspecified: Secondary | ICD-10-CM | POA: Insufficient documentation

## 2016-12-23 DIAGNOSIS — I120 Hypertensive chronic kidney disease with stage 5 chronic kidney disease or end stage renal disease: Secondary | ICD-10-CM | POA: Insufficient documentation

## 2016-12-23 LAB — CBC
HCT: 36.7 % — ABNORMAL LOW (ref 39.0–52.0)
HEMOGLOBIN: 12.5 g/dL — AB (ref 13.0–17.0)
MCH: 30.7 pg (ref 26.0–34.0)
MCHC: 34.1 g/dL (ref 30.0–36.0)
MCV: 90.2 fL (ref 78.0–100.0)
PLATELETS: 199 10*3/uL (ref 150–400)
RBC: 4.07 MIL/uL — ABNORMAL LOW (ref 4.22–5.81)
RDW: 14.1 % (ref 11.5–15.5)
WBC: 9.2 10*3/uL (ref 4.0–10.5)

## 2016-12-23 LAB — COMPREHENSIVE METABOLIC PANEL
ALBUMIN: 4.2 g/dL (ref 3.5–5.0)
ALK PHOS: 65 U/L (ref 38–126)
ALT: 16 U/L — AB (ref 17–63)
ANION GAP: 15 (ref 5–15)
AST: 17 U/L (ref 15–41)
BILIRUBIN TOTAL: 0.7 mg/dL (ref 0.3–1.2)
BUN: 29 mg/dL — ABNORMAL HIGH (ref 6–20)
CALCIUM: 9 mg/dL (ref 8.9–10.3)
CO2: 24 mmol/L (ref 22–32)
CREATININE: 7.67 mg/dL — AB (ref 0.61–1.24)
Chloride: 95 mmol/L — ABNORMAL LOW (ref 101–111)
GFR calc non Af Amer: 8 mL/min — ABNORMAL LOW (ref >60)
GFR, EST AFRICAN AMERICAN: 10 mL/min — AB (ref >60)
Glucose, Bld: 109 mg/dL — ABNORMAL HIGH (ref 65–99)
Potassium: 3.3 mmol/L — ABNORMAL LOW (ref 3.5–5.1)
Sodium: 134 mmol/L — ABNORMAL LOW (ref 135–145)
TOTAL PROTEIN: 7.6 g/dL (ref 6.5–8.1)

## 2016-12-23 LAB — I-STAT TROPONIN, ED: TROPONIN I, POC: 0 ng/mL (ref 0.00–0.08)

## 2016-12-23 LAB — LIPASE, BLOOD: Lipase: 100 U/L — ABNORMAL HIGH (ref 11–51)

## 2016-12-23 MED ORDER — ONDANSETRON HCL 4 MG/2ML IJ SOLN
4.0000 mg | Freq: Once | INTRAMUSCULAR | Status: AC
  Administered 2016-12-23: 4 mg via INTRAVENOUS
  Filled 2016-12-23: qty 2

## 2016-12-23 MED ORDER — PANTOPRAZOLE SODIUM 40 MG IV SOLR
40.0000 mg | Freq: Once | INTRAVENOUS | Status: AC
  Administered 2016-12-23: 40 mg via INTRAVENOUS
  Filled 2016-12-23: qty 40

## 2016-12-23 MED ORDER — GI COCKTAIL ~~LOC~~
30.0000 mL | Freq: Once | ORAL | Status: AC
  Administered 2016-12-23: 30 mL via ORAL
  Filled 2016-12-23: qty 30

## 2016-12-23 MED ORDER — MORPHINE SULFATE (PF) 4 MG/ML IV SOLN
2.0000 mg | Freq: Once | INTRAVENOUS | Status: AC
  Administered 2016-12-23: 2 mg via INTRAVENOUS
  Filled 2016-12-23: qty 1

## 2016-12-23 MED ORDER — HALOPERIDOL LACTATE 5 MG/ML IJ SOLN
5.0000 mg | Freq: Once | INTRAMUSCULAR | Status: AC
  Administered 2016-12-23: 5 mg via INTRAVENOUS
  Filled 2016-12-23: qty 1

## 2016-12-23 NOTE — ED Notes (Signed)
Patient transported to X-ray 

## 2016-12-23 NOTE — ED Triage Notes (Signed)
Pt c/o upper abd pain, rates pain 10/10. Pain started this morning when he woke up. Pt had dialysis this AM. Pt had a cholecystectomy about a month ago. Emesis at home x5.

## 2016-12-23 NOTE — ED Provider Notes (Signed)
11:15 PM  Assumed care from Dr. Lita Mains.  Briefly, patient is a 31 year old male who recently underwent cholecystectomy on 11/23/16 who presents emergency department with abdominal pain. Workup here has been unremarkable other than mildly elevated lipase. He does have end-stage renal disease and is on hemodialysis. No leukocytosis. Troponin negative. EKG shows no acute abdomen. X-ray of abdomen unremarkable. Given elevated lipase, we are obtaining a right upper quadrant ultrasound for further evaluation. He last had a CT scan of his abdomen and pelvis on 12/05/16 which showed no acute abnormality. Patient has been seen in the emergency department several times for similar symptoms.    EKG Interpretation  Date/Time:  Wednesday Dec 23 2016 20:59:16 EDT Ventricular Rate:  92 PR Interval:  162 QRS Duration: 108 QT Interval:  368 QTC Calculation: 455 R Axis:   30 Text Interpretation:  Normal sinus rhythm Normal ECG When compared with ECG of 11/19/2016, No significant change was found Confirmed by Delora Fuel ([id number]) on 12/23/2016 10:56:25 PM       12:55 AM  Pt's Right upper quadrant ultrasound is unremarkable. Patient may have mild pancreatitis but there is also concerns for drug-seeking behavior. I do not feel comfortable discharging him with prescription for narcotic pain medication. We'll discharge with prescriptions for Bentyl, Zofran for symptomatically relief at home. He has follow-up with a gastroenterologist scheduled May 30. Have advised him to keep this appointment. Discussed return precautions. He reports his pain has improved significantly here. He has not had any vomiting.   At this time, I do not feel there is any life-threatening condition present. I have reviewed and discussed all results (EKG, imaging, lab, urine as appropriate) and exam findings with patient/family. I have reviewed nursing notes and appropriate previous records.  I feel the patient is safe to be discharged home without  further emergent workup and can continue workup as an outpatient as needed. Discussed usual and customary return precautions. Patient/family verbalize understanding and are comfortable with this plan.  Outpatient follow-up has been provided if needed. All questions have been answered.    Geniece Akers, Delice Bison, DO 12/24/16 [phone]

## 2016-12-23 NOTE — ED Notes (Signed)
Pt observed dry heaving.

## 2016-12-23 NOTE — ED Provider Notes (Signed)
[place] DEPT Provider Note   CSN: [id number] Arrival date & time: 12/23/16  2048     History   Chief Complaint Chief Complaint  Patient presents with  . Abdominal Pain    HPI [name] is a 31 y.o. male.  HPI Patient with history of chronic abdominal pain. Seen multiple times in the emergency department. Had gallbladder removed last month. States his pain worsened this evening 2 hours prior to presentation. Describes the pain as sharp and radiating into the chest. Associated with nausea and vomiting. Had normal bowel movement earlier today. Denies taking any medications at home for his symptoms. No fever or chills. Patient is drowsy and my exam and a poor historian. Patient stating he wants morphine. Past Medical History:  Diagnosis Date  . Arthritis    "all over" (11/19/2016)  . Chronic lower back pain   . Complication of anesthesia    A little while to wake up after knee surgery in 2008  . Dizziness    when coming off of dialysis  . ESRD (end stage renal disease) on dialysis [facility])    MWF and goes to [facility] (11/19/2016)  . Family history of anesthesia complication    mom slow to wake up  . GERD (gastroesophageal reflux disease)    takes Omeprazole as needed  . OIZTIWPY(099.8)    "monthly" (11/19/2016)  . History of hiatal hernia   . Hyperparathyroidism ([place])   . Hypertension   . Joint pain   . Joint swelling   . Morbid obesity ([place])   . PONV (postoperative nausea and vomiting)   . Renal insufficiency    Pt is on dyalisis and has been for 14 years    Patient Active Problem List   Diagnosis Date Noted  . RUQ abdominal pain 11/20/2016  . RUQ pain 11/19/2016  . Gastritis 11/19/2016  . Increased anion gap metabolic acidosis 33/82/5053  . Intractable abdominal pain   . Common bile duct dilatation   . Cholelithiasis without cholecystitis   . ?? Gastroparesis- NORMAL NM gastric emptying study 2016   . Intractable nausea and vomiting 10/08/2016  .  Hematemesis 08/14/2016  . GERD (gastroesophageal reflux disease) 08/14/2016  . Essential hypertension 08/14/2016  . GIB (gastrointestinal bleeding) 08/14/2016  . SIRS (systemic inflammatory response syndrome) ([employer]) 08/14/2016  . CKD (chronic kidney disease) stage V requiring chronic dialysis ([place]) 06/11/2016  . Non-intractable vomiting with nausea 06/11/2016  . Hepatic steatosis 06/11/2016  . Cholelithiasis 06/11/2016  . Localized osteoarthritis of left knee 07/11/2015  . Abdominal pain, epigastric 12/05/2014  . Lapband April 2016 11/27/2014  . Arthritis of knee 03/27/2014  . Morbid obesity, weight 291, BMI - 47 02/15/2014  . Hyperparathyroidism, secondary ([place]) 03/14/2013    Past Surgical History:  Procedure Laterality Date  . AV FISTULA PLACEMENT Bilateral 2005,2007   "right; left"  . AV FISTULA REPAIR Right 2007   "tried to clean it out; couldn't do it"  . BREATH TEK H PYLORI N/A 05/15/2014   Procedure: BREATH TEK H PYLORI;  Surgeon: Alphonsa Overall, MD;  Location: [facility];  Service: General;  Laterality: N/A;  . CHOLECYSTECTOMY N/A 11/23/2016   Procedure: LAPAROSCOPIC CHOLECYSTECTOMY WITH INTRAOPERATIVE CHOLANGIOGRAM;  Surgeon: Erroll Luna, MD;  Location: [place];  Service: General;  Laterality: N/A;  . ESOPHAGOGASTRODUODENOSCOPY N/A 12/07/2014   Procedure: ESOPHAGOGASTRODUODENOSCOPY (EGD);  Surgeon: Alphonsa Overall, MD;  Location: [facility] ENDOSCOPY;  Service: General;  Laterality: N/A;  . ESOPHAGOGASTRODUODENOSCOPY N/A 12/17/2014   Procedure: ESOPHAGOGASTRODUODENOSCOPY (EGD);  Surgeon:  Alphonsa Overall, MD;  Location: [place];  Service: General;  Laterality: N/A;  . ESOPHAGOGASTRODUODENOSCOPY N/A 08/16/2016   Procedure: ESOPHAGOGASTRODUODENOSCOPY (EGD);  Surgeon: Jerene Bears, MD;  Location: [place];  Service: Gastroenterology;  Laterality: N/A;  . HERNIA REPAIR  11/2014   w/lap band OR  . JOINT REPLACEMENT    . KNEE ARTHROSCOPY Left 2007  . LAPAROSCOPIC GASTRIC BANDING N/A 11/12/2014     Procedure: LAPAROSCOPIC GASTRIC BANDING;  Surgeon: Alphonsa Overall, MD;  Location: [facility];  Service: General;  Laterality: N/A;  . PARATHYROIDECTOMY N/A 03/30/2013   Procedure: TOTAL PARATHYROIDECTOMY WITH AUTOTRANSPLANT;  Surgeon: Earnstine Regal, MD;  Location: [place];  Service: General;  Laterality: N/A;  Autotransplant to left lower arm.  Marland Kitchen TOTAL KNEE ARTHROPLASTY Right 03/27/2014   Procedure: UNICOMPARTMENTAL ARTHROPLASTY;  Surgeon: Meredith Pel, MD;  Location: [place];  Service: Orthopedics;  Laterality: Right;       Home Medications    Prior to Admission medications   Medication Sig Start Date End Date Taking? Authorizing Provider  albuterol (PROVENTIL HFA;VENTOLIN HFA) 108 (90 Base) MCG/ACT inhaler Inhale 2 puffs into the lungs every 4 (four) hours as needed for wheezing or shortness of breath. 05/14/16  Yes Oxford, Orson Ape, [name]  calcium carbonate (TUMS - DOSED IN MG ELEMENTAL CALCIUM) 500 MG chewable tablet Chew 2 tablets by mouth 2 (two) times daily as needed for indigestion or heartburn.    Yes [provider]  glycopyrrolate (ROBINUL) 2 MG tablet Take 1 tablet (2 mg total) by mouth 2 (two) times daily. 01/25/15  Yes Inda Castle, MD  HYDROcodone-acetaminophen (NORCO/VICODIN) 5-325 MG tablet Take 1-2 tablets by mouth every 6 (six) hours as needed for severe pain. 12/05/16  Yes Law, Alexandra M, [name]  Hyoscyamine Sulfate 0.375 MG TBCR Take 1 tablet twice daily Patient taking differently: Take 0.375 mg by mouth 2 (two) times daily.  01/22/15  Yes Inda Castle, MD  lidocaine (XYLOCAINE) 2 % jelly Apply 1 application topically as needed. Patient taking differently: Apply 1 application topically daily as needed (pain).  02/19/16  Yes Cartner, Marland Kitchen, [name]  methocarbamol (ROBAXIN) 500 MG tablet Take 1 tablet (500 mg total) by mouth every 8 (eight) hours as needed for muscle spasms. 06/15/16  Yes Theodis Blaze, MD  metoCLOPramide (REGLAN) 5 MG tablet Take 1 tablet (5 mg  total) by mouth 2 (two) times daily before a meal. 10/09/16  Yes Patrecia Pour, MD  multivitamin (RENA-VIT) TABS tablet Take 1 tablet by mouth daily.   Yes [provider]  omeprazole (PRILOSEC) 20 MG capsule Take 1 capsule (20 mg total) by mouth daily. 07/27/16  Yes Rancour, Annie Main, MD  ondansetron (ZOFRAN ODT) 4 MG disintegrating tablet Take 1 tablet (4 mg total) by mouth every 8 (eight) hours as needed for nausea or vomiting. 12/17/16  Yes Joy, Shawn C, [name]  ondansetron (ZOFRAN) 4 MG tablet Take 1 tablet (4 mg total) by mouth every 6 (six) hours. 12/05/16  Yes Law, Bea Graff, [name]  sevelamer carbonate (RENVELA) 2.4 G PACK Take 2.4 g by mouth 3 (three) times daily with meals.    Yes [provider]  traMADol (ULTRAM) 50 MG tablet Take 1 tablet (50 mg total) by mouth every 8 (eight) hours as needed. Patient taking differently: Take 50 mg by mouth every 6 (six) hours as needed (for pain).  06/15/16  Yes Theodis Blaze, MD  dicyclomine (BENTYL) 20 MG tablet Take 1 tablet (20 mg total)  by mouth 3 (three) times daily before meals. As needed for abdominal cramping 12/24/16   Ward, Delice Bison, DO  omeprazole (PRILOSEC) 40 MG capsule Take 1 capsule (40 mg total) by mouth daily. 12/24/16   Ward, Delice Bison, DO  ondansetron (ZOFRAN ODT) 4 MG disintegrating tablet Take 1 tablet (4 mg total) by mouth every 8 (eight) hours as needed for nausea or vomiting. 12/24/16   Ward, Delice Bison, DO    Family History Family History  Problem Relation Age of Onset  . Hypertension Mother   . Diabetes Father   . Kidney Stones Sister   . Diabetes Maternal Grandmother   . Liver cancer Maternal Uncle     Social History Social History  Substance Use Topics  . Smoking status: Current Every Day Smoker    Packs/day: 0.10    Years: 16.00    Types: Cigarettes    Last attempt to quit: 12/17/2014  . Smokeless tobacco: Never Used  . Alcohol use No     Allergies   Dilaudid [hydromorphone hcl]   Review of  Systems Review of Systems  Constitutional: Negative for chills and fever.  Respiratory: Negative for cough and shortness of breath.   Cardiovascular: Positive for chest pain. Negative for palpitations and leg swelling.  Gastrointestinal: Positive for abdominal pain, nausea and vomiting. Negative for blood in stool, constipation and diarrhea.  Musculoskeletal: Negative for back pain.  Skin: Negative for rash and wound.  Neurological: Negative for dizziness, weakness, light-headedness and headaches.  All other systems reviewed and are negative.    Physical Exam Updated Vital Signs BP 116/77 (BP Location: Right Arm)   Pulse 95   Temp 98.9 F (37.2 C) (Oral)   Resp 18   SpO2 98%   Physical Exam  Constitutional: He is oriented to person, place, and time. He appears well-developed and well-nourished. No distress.  Patient is drowsy but easily aroused  HENT:  Head: Normocephalic and atraumatic.  Mouth/Throat: Oropharynx is clear and moist. No oropharyngeal exudate.  Eyes: EOM are normal. Pupils are equal, round, and reactive to light.  Neck: Normal range of motion. Neck supple.  Cardiovascular: Normal rate and regular rhythm.  Exam reveals no gallop and no friction rub.   No murmur heard. Pulmonary/Chest: Effort normal and breath sounds normal. No respiratory distress. He has no wheezes. He has no rales. He exhibits no tenderness.  Abdominal: Soft. Bowel sounds are normal. There is tenderness. There is no rebound and no guarding.  Diffuse abdominal tenderness without focality rebound or guarding.  Musculoskeletal: Normal range of motion. He exhibits no edema or tenderness.  Left upper extremity AV fistula. Distal pulses intact.  Neurological: He is alert and oriented to person, place, and time.  Moving all extremities without focal deficit. Sensation is fully intact.  Skin: Skin is warm and dry. Capillary refill takes less than 2 seconds. No rash noted. No erythema.  Psychiatric: He  has a normal mood and affect. His behavior is normal.  Nursing note and vitals reviewed.    ED Treatments / Results  Labs (all labs ordered are listed, but only abnormal results are displayed) Labs Reviewed  LIPASE, BLOOD - Abnormal; Notable for the following:       Result Value   Lipase 100 (*)    All other components within normal limits  COMPREHENSIVE METABOLIC PANEL - Abnormal; Notable for the following:    Sodium 134 (*)    Potassium 3.3 (*)    Chloride 95 (*)  Glucose, Bld 109 (*)    BUN 29 (*)    Creatinine, Ser 7.67 (*)    ALT 16 (*)    GFR calc non Af Amer 8 (*)    GFR calc Af Amer 10 (*)    All other components within normal limits  CBC - Abnormal; Notable for the following:    RBC 4.07 (*)    Hemoglobin 12.5 (*)    HCT 36.7 (*)    All other components within normal limits  I-STAT TROPOININ, ED    EKG  EKG Interpretation  Date/Time:  Wednesday Dec 23 2016 20:59:16 EDT Ventricular Rate:  92 PR Interval:  162 QRS Duration: 108 QT Interval:  368 QTC Calculation: 455 R Axis:   30 Text Interpretation:  Normal sinus rhythm Normal ECG When compared with ECG of 11/19/2016, No significant change was found Confirmed by Delora Fuel ([id number]) on 12/23/2016 10:56:25 PM       Radiology US Abdomen Limited  Result Date: 12/24/2016 CLINICAL DATA:  Upper abdominal pain for 1 day EXAM: US ABDOMEN LIMITED - RIGHT UPPER QUADRANT COMPARISON:  12/05/2016, 11/19/2016 FINDINGS: Gallbladder: Surgically absent Common bile duct: Diameter: Normal at 5.2 mm Liver: No focal lesion identified. Within normal limits in parenchymal echogenicity. Incidental note made of atrophic echogenic right kidney. IMPRESSION: 1. Surgical absence of gallbladder.  No biliary dilatation 2. Echogenic atrophic right kidney Electronically Signed   By: Donavan Foil M.D.   On: 12/24/2016 00:19   Dg Abd Acute W/chest  Result Date: 12/23/2016 CLINICAL DATA:  Abdominal pain and vomiting for 1 day. EXAM: DG  ABDOMEN ACUTE W/ 1V CHEST COMPARISON:  CT abdomen pelvis 12/05/2016. Chest radiograph 12/05/2016. FINDINGS: Shallow inspiration. Heart size and pulmonary vascularity are normal. No focal airspace disease or consolidation in the lungs. No blunting of costophrenic angles. No pneumothorax. Surgical clips in the base of the neck. Scattered gas and stool in the colon. No small or large bowel distention. No free intra-abdominal air. No abnormal air-fluid levels. Surgical clips in the right upper quadrant. No radiopaque stones. Calcified phleboliths in the pelvis. Visualized bones appear intact. Soft tissue contours are unremarkable. IMPRESSION: No evidence of active pulmonary disease. Normal nonobstructive bowel gas pattern. Electronically Signed   By: Lucienne Capers M.D.   On: 12/23/2016 22:16    Procedures Procedures (including critical care time)  Medications Ordered in ED Medications  ondansetron [facility]) injection 4 mg (4 mg Intravenous Given 12/23/16 2146)  gi cocktail (Maalox,Lidocaine,Donnatal) (30 mLs Oral Given 12/23/16 2146)  pantoprazole (PROTONIX) injection 40 mg (40 mg Intravenous Given 12/23/16 2146)  haloperidol lactate (HALDOL) injection 5 mg (5 mg Intravenous Given 12/23/16 2309)  morphine 4 MG/ML injection 2 mg (2 mg Intravenous Given 12/23/16 2309)     Initial Impression / Assessment and Plan / ED Course  I have reviewed the triage vital signs and the nursing notes.  Pertinent labs & imaging results that were available during my care of the patient were reviewed by me and considered in my medical decision making (see chart for details).     Patient with history of gastroparesis, GERD and biliary colic. Will screen for evidence of obstruction. Concern for possible drug-seeking behavior. Signed out to oncoming emergency physician pending abdominal ultrasound and repeat evaluation. Lipase is mildly elevated. Anticipate discharge home. Final Clinical Impressions(s) / ED Diagnoses    Final diagnoses:  Pain of upper abdomen    New Prescriptions Discharge Medication List as of 12/24/2016 12:58 AM    START  taking these medications   Details  dicyclomine (BENTYL) 20 MG tablet Take 1 tablet (20 mg total) by mouth 3 (three) times daily before meals. As needed for abdominal cramping, Starting Thu 12/24/2016, Print    !! omeprazole (PRILOSEC) 40 MG capsule Take 1 capsule (40 mg total) by mouth daily., Starting Thu 12/24/2016, Print    !! ondansetron (ZOFRAN ODT) 4 MG disintegrating tablet Take 1 tablet (4 mg total) by mouth every 8 (eight) hours as needed for nausea or vomiting., Starting Thu 12/24/2016, Print     !! - Potential duplicate medications found. Please discuss with provider.       Julianne Rice, MD 12/24/16 [phone]

## 2016-12-23 NOTE — ED Notes (Signed)
Pt returned from CT °

## 2016-12-24 MED ORDER — DICYCLOMINE HCL 20 MG PO TABS: 20.0000 mg | ORAL_TABLET | Freq: Three times a day (TID) | ORAL | 0 refills | Status: DC

## 2016-12-24 MED ORDER — ONDANSETRON 4 MG PO TBDP: 4.0000 mg | ORAL_TABLET | Freq: Three times a day (TID) | ORAL | 0 refills | Status: DC | PRN

## 2016-12-24 MED ORDER — OMEPRAZOLE 40 MG PO CPDR: 40.0000 mg | DELAYED_RELEASE_CAPSULE | Freq: Every day | ORAL | 1 refills | Status: DC

## 2016-12-24 NOTE — Discharge Instructions (Signed)
Follow-up with your GI specialist as scheduled on May 30.

## 2016-12-25 ENCOUNTER — Emergency Department (HOSPITAL_COMMUNITY): Payer: Medicare Other

## 2016-12-25 ENCOUNTER — Encounter (HOSPITAL_COMMUNITY): Payer: Self-pay | Admitting: Emergency Medicine

## 2016-12-25 ENCOUNTER — Emergency Department (HOSPITAL_COMMUNITY)
Admission: EM | Admit: 2016-12-25 | Discharge: 2016-12-26 | Disposition: A | Discharge status: Home / Self Care | Payer: Medicare Other | Attending: Emergency Medicine | Admitting: Emergency Medicine

## 2016-12-25 DIAGNOSIS — R1013 Epigastric pain: Secondary | ICD-10-CM

## 2016-12-25 DIAGNOSIS — Z96651 Presence of right artificial knee joint: Secondary | ICD-10-CM | POA: Insufficient documentation

## 2016-12-25 DIAGNOSIS — N186 End stage renal disease: Secondary | ICD-10-CM | POA: Insufficient documentation

## 2016-12-25 DIAGNOSIS — F17210 Nicotine dependence, cigarettes, uncomplicated: Secondary | ICD-10-CM | POA: Diagnosis not present

## 2016-12-25 DIAGNOSIS — I120 Hypertensive chronic kidney disease with stage 5 chronic kidney disease or end stage renal disease: Secondary | ICD-10-CM | POA: Insufficient documentation

## 2016-12-25 DIAGNOSIS — R112 Nausea with vomiting, unspecified: Secondary | ICD-10-CM

## 2016-12-25 LAB — COMPREHENSIVE METABOLIC PANEL
ALBUMIN: 4.3 g/dL (ref 3.5–5.0)
ALT: 18 U/L (ref 17–63)
ANION GAP: 17 — AB (ref 5–15)
AST: 20 U/L (ref 15–41)
Alkaline Phosphatase: 75 U/L (ref 38–126)
BILIRUBIN TOTAL: 0.7 mg/dL (ref 0.3–1.2)
BUN: 13 mg/dL (ref 6–20)
CO2: 23 mmol/L (ref 22–32)
Calcium: 9 mg/dL (ref 8.9–10.3)
Chloride: 95 mmol/L — ABNORMAL LOW (ref 101–111)
Creatinine, Ser: 5.88 mg/dL — ABNORMAL HIGH (ref 0.61–1.24)
GFR calc Af Amer: 13 mL/min — ABNORMAL LOW (ref >60)
GFR calc non Af Amer: 12 mL/min — ABNORMAL LOW (ref >60)
GLUCOSE: 117 mg/dL — AB (ref 65–99)
POTASSIUM: 3 mmol/L — AB (ref 3.5–5.1)
Sodium: 135 mmol/L (ref 135–145)
Total Protein: 8.4 g/dL — ABNORMAL HIGH (ref 6.5–8.1)

## 2016-12-25 LAB — LIPASE, BLOOD: LIPASE: 40 U/L (ref 11–51)

## 2016-12-25 LAB — CBC
HEMATOCRIT: 38 % — AB (ref 39.0–52.0)
HEMOGLOBIN: 13.2 g/dL (ref 13.0–17.0)
MCH: 31.1 pg (ref 26.0–34.0)
MCHC: 34.7 g/dL (ref 30.0–36.0)
MCV: 89.6 fL (ref 78.0–100.0)
Platelets: 241 10*3/uL (ref 150–400)
RBC: 4.24 MIL/uL (ref 4.22–5.81)
RDW: 13.9 % (ref 11.5–15.5)
WBC: 10.7 10*3/uL — AB (ref 4.0–10.5)

## 2016-12-25 LAB — TROPONIN I: Troponin I: <0.03 ng/mL (ref <0.03)

## 2016-12-25 MED ORDER — FAMOTIDINE 20 MG PO TABS
20.0000 mg | ORAL_TABLET | Freq: Once | ORAL | Status: AC
  Administered 2016-12-25: 20 mg via ORAL
  Filled 2016-12-25: qty 1

## 2016-12-25 MED ORDER — OXYCODONE-ACETAMINOPHEN 5-325 MG PO TABS
1.0000 | ORAL_TABLET | Freq: Once | ORAL | Status: AC
  Administered 2016-12-25: 1 via ORAL
  Filled 2016-12-25: qty 1

## 2016-12-25 MED ORDER — LORAZEPAM 1 MG PO TABS
1.0000 mg | ORAL_TABLET | Freq: Once | ORAL | Status: AC
  Administered 2016-12-25: 1 mg via ORAL
  Filled 2016-12-25: qty 1

## 2016-12-25 MED ORDER — ALUM & MAG HYDROXIDE-SIMETH 200-200-20 MG/5ML PO SUSP
15.0000 mL | Freq: Once | ORAL | Status: AC
  Administered 2016-12-25: 15 mL via ORAL
  Filled 2016-12-25: qty 30

## 2016-12-25 NOTE — ED Provider Notes (Signed)
[facility] DEPT Provider Note   CSN: [id number] Arrival date & time: 12/25/16  1927     History   Chief Complaint Chief Complaint  Patient presents with  . Abdominal Pain    HPI LAVON BOTHWELL is a 31 y.o. male.  Patient w hx esrd/hd, had normal hd today, c/o epigastric pain and nv onset today. Symptoms acute onset, persistent, moderate. No specific exacerbating or alleviating factors. 1-2 episodes emesis, not bloody or bilious. Had normal bm today. No back or flank pain. No gu c/o, make no urine at baseline. Denies fever or chills. No chest pain or discomfort. No sob. Hx cyclic vomiting syndrome. Had cholecystectomy 1 month ago - no change in symptoms since. Denies thc use.    The history is provided by the patient.  Abdominal Pain   Associated symptoms include nausea and vomiting. Pertinent negatives include fever and headaches.    Past Medical History:  Diagnosis Date  . Arthritis    "all over" (11/19/2016)  . Chronic lower back pain   . Complication of anesthesia    A little while to wake up after knee surgery in 2008  . Dizziness    when coming off of dialysis  . ESRD (end stage renal disease) on dialysis [facility])    MWF and goes to [facility] (11/19/2016)  . Family history of anesthesia complication    mom slow to wake up  . GERD (gastroesophageal reflux disease)    takes Omeprazole as needed  . YTKZSWFU(932.3)    "monthly" (11/19/2016)  . History of hiatal hernia   . Hyperparathyroidism ([place])   . Hypertension   . Joint pain   . Joint swelling   . Morbid obesity ([place])   . PONV (postoperative nausea and vomiting)   . Renal insufficiency    Pt is on dyalisis and has been for 14 years    Patient Active Problem List   Diagnosis Date Noted  . RUQ abdominal pain 11/20/2016  . RUQ pain 11/19/2016  . Gastritis 11/19/2016  . Increased anion gap metabolic acidosis 55/73/2202  . Intractable abdominal pain   . Common bile duct dilatation   . Cholelithiasis  without cholecystitis   . ?? Gastroparesis- NORMAL NM gastric emptying study 2016   . Intractable nausea and vomiting 10/08/2016  . Hematemesis 08/14/2016  . GERD (gastroesophageal reflux disease) 08/14/2016  . Essential hypertension 08/14/2016  . GIB (gastrointestinal bleeding) 08/14/2016  . SIRS (systemic inflammatory response syndrome) ([place]) 08/14/2016  . CKD (chronic kidney disease) stage V requiring chronic dialysis ([place]) 06/11/2016  . Non-intractable vomiting with nausea 06/11/2016  . Hepatic steatosis 06/11/2016  . Cholelithiasis 06/11/2016  . Localized osteoarthritis of left knee 07/11/2015  . Abdominal pain, epigastric 12/05/2014  . Lapband April 2016 11/27/2014  . Arthritis of knee 03/27/2014  . Morbid obesity, weight 291, BMI - 47 02/15/2014  . Hyperparathyroidism, secondary ([place]) 03/14/2013    Past Surgical History:  Procedure Laterality Date  . AV FISTULA PLACEMENT Bilateral 2005,2007   "right; left"  . AV FISTULA REPAIR Right 2007   "tried to clean it out; couldn't do it"  . BREATH TEK H PYLORI N/A 05/15/2014   Procedure: BREATH TEK H PYLORI;  Surgeon: Alphonsa Overall, MD;  Location: [facility];  Service: General;  Laterality: N/A;  . CHOLECYSTECTOMY N/A 11/23/2016   Procedure: LAPAROSCOPIC CHOLECYSTECTOMY WITH INTRAOPERATIVE CHOLANGIOGRAM;  Surgeon: Erroll Luna, MD;  Location: [facility];  Service: General;  Laterality: N/A;  . ESOPHAGOGASTRODUODENOSCOPY N/A 12/07/2014   Procedure:  ESOPHAGOGASTRODUODENOSCOPY (EGD);  Surgeon: Alphonsa Overall, MD;  Location: [facility];  Service: General;  Laterality: N/A;  . ESOPHAGOGASTRODUODENOSCOPY N/A 12/17/2014   Procedure: ESOPHAGOGASTRODUODENOSCOPY (EGD);  Surgeon: Alphonsa Overall, MD;  Location: [facility];  Service: General;  Laterality: N/A;  . ESOPHAGOGASTRODUODENOSCOPY N/A 08/16/2016   Procedure: ESOPHAGOGASTRODUODENOSCOPY (EGD);  Surgeon: Jerene Bears, MD;  Location: [facility];  Service: Gastroenterology;  Laterality: N/A;  . HERNIA  REPAIR  11/2014   w/lap band OR  . JOINT REPLACEMENT    . KNEE ARTHROSCOPY Left 2007  . LAPAROSCOPIC GASTRIC BANDING N/A 11/12/2014   Procedure: LAPAROSCOPIC GASTRIC BANDING;  Surgeon: Alphonsa Overall, MD;  Location: [facility];  Service: General;  Laterality: N/A;  . PARATHYROIDECTOMY N/A 03/30/2013   Procedure: TOTAL PARATHYROIDECTOMY WITH AUTOTRANSPLANT;  Surgeon: Earnstine Regal, MD;  Location: [facility];  Service: General;  Laterality: N/A;  Autotransplant to left lower arm.  Marland Kitchen TOTAL KNEE ARTHROPLASTY Right 03/27/2014   Procedure: UNICOMPARTMENTAL ARTHROPLASTY;  Surgeon: Meredith Pel, MD;  Location: [place];  Service: Orthopedics;  Laterality: Right;       Home Medications    Prior to Admission medications   Medication Sig Start Date End Date Taking? Authorizing Provider  albuterol (PROVENTIL HFA;VENTOLIN HFA) 108 (90 Base) MCG/ACT inhaler Inhale 2 puffs into the lungs every 4 (four) hours as needed for wheezing or shortness of breath. 05/14/16   Lysbeth Penner, [name]  calcium carbonate (TUMS - DOSED IN MG ELEMENTAL CALCIUM) 500 MG chewable tablet Chew 2 tablets by mouth 2 (two) times daily as needed for indigestion or heartburn.     [provider]  dicyclomine (BENTYL) 20 MG tablet Take 1 tablet (20 mg total) by mouth 3 (three) times daily before meals. As needed for abdominal cramping 12/24/16   Ward, Delice Bison, DO  glycopyrrolate (ROBINUL) 2 MG tablet Take 1 tablet (2 mg total) by mouth 2 (two) times daily. 01/25/15   Inda Castle, MD  HYDROcodone-acetaminophen (NORCO/VICODIN) 5-325 MG tablet Take 1-2 tablets by mouth every 6 (six) hours as needed for severe pain. 12/05/16   Law, Bea Graff, [name]  Hyoscyamine Sulfate 0.375 MG TBCR Take 1 tablet twice daily Patient taking differently: Take 0.375 mg by mouth 2 (two) times daily.  01/22/15   Inda Castle, MD  lidocaine (XYLOCAINE) 2 % jelly Apply 1 application topically as needed. Patient taking differently: Apply 1 application  topically daily as needed (pain).  02/19/16   Cartner, Marland Kitchen, [name]  methocarbamol (ROBAXIN) 500 MG tablet Take 1 tablet (500 mg total) by mouth every 8 (eight) hours as needed for muscle spasms. 06/15/16   Theodis Blaze, MD  metoCLOPramide (REGLAN) 5 MG tablet Take 1 tablet (5 mg total) by mouth 2 (two) times daily before a meal. 10/09/16   Patrecia Pour, MD  multivitamin (RENA-VIT) TABS tablet Take 1 tablet by mouth daily.    [provider]  omeprazole (PRILOSEC) 20 MG capsule Take 1 capsule (20 mg total) by mouth daily. 07/27/16   Rancour, Annie Main, MD  omeprazole (PRILOSEC) 40 MG capsule Take 1 capsule (40 mg total) by mouth daily. 12/24/16   Ward, Delice Bison, DO  ondansetron (ZOFRAN ODT) 4 MG disintegrating tablet Take 1 tablet (4 mg total) by mouth every 8 (eight) hours as needed for nausea or vomiting. 12/17/16   Joy, Shawn C, [name]  ondansetron (ZOFRAN ODT) 4 MG disintegrating tablet Take 1 tablet (4 mg total) by mouth every 8 (eight) hours as needed for nausea  or vomiting. 12/24/16   Ward, Delice Bison, DO  ondansetron (ZOFRAN) 4 MG tablet Take 1 tablet (4 mg total) by mouth every 6 (six) hours. 12/05/16   Frederica Kuster, [name]  sevelamer carbonate (RENVELA) 2.4 G PACK Take 2.4 g by mouth 3 (three) times daily with meals.     [provider]  traMADol (ULTRAM) 50 MG tablet Take 1 tablet (50 mg total) by mouth every 8 (eight) hours as needed. Patient taking differently: Take 50 mg by mouth every 6 (six) hours as needed (for pain).  06/15/16   Theodis Blaze, MD    Family History Family History  Problem Relation Age of Onset  . Hypertension Mother   . Diabetes Father   . Kidney Stones Sister   . Diabetes Maternal Grandmother   . Liver cancer Maternal Uncle     Social History Social History  Substance Use Topics  . Smoking status: Current Every Day Smoker    Packs/day: 0.10    Years: 16.00    Types: Cigarettes    Last attempt to quit: 12/17/2014  . Smokeless tobacco:  Never Used  . Alcohol use No     Allergies   Dilaudid [hydromorphone hcl]   Review of Systems Review of Systems  Constitutional: Negative for chills and fever.  HENT: Negative for sore throat.   Eyes: Negative for redness.  Respiratory: Negative for cough and shortness of breath.   Cardiovascular: Negative for chest pain.  Gastrointestinal: Positive for abdominal pain, nausea and vomiting.  Genitourinary: Negative for flank pain.  Musculoskeletal: Negative for back pain and neck pain.  Skin: Negative for rash.  Neurological: Negative for headaches.  Hematological: Does not bruise/bleed easily.  Psychiatric/Behavioral: Negative for confusion.     Physical Exam Updated Vital Signs BP (!) 96/51 (BP Location: Right Arm)   Pulse 93   Temp 98.6 F (37 C) (Oral)   Resp 20   Ht 1.676 m (5\' 6" )   Wt (!) 140.6 kg (310 lb)   SpO2 99%   BMI 50.04 kg/m   Physical Exam  Constitutional: He appears well-developed and well-nourished. No distress.  HENT:  Mouth/Throat: Oropharynx is clear and moist.  Eyes: Conjunctivae are normal.  Neck: Neck supple. No tracheal deviation present.  Cardiovascular: Normal rate, regular rhythm and intact distal pulses.  Exam reveals no gallop and no friction rub.   No murmur heard. Pulmonary/Chest: Effort normal and breath sounds normal. No accessory muscle usage. No respiratory distress.  Abdominal: Soft. Bowel sounds are normal. He exhibits no distension and no mass. There is tenderness. There is no rebound and no guarding. No hernia.  Mild epigastric tenderness, no rebound or guarding.   Musculoskeletal: He exhibits no edema.  Neurological: He is alert.  Skin: Skin is warm and dry. No rash noted. He is not diaphoretic.  Psychiatric:  Anxious appearing.   Nursing note and vitals reviewed.    ED Treatments / Results  Labs (all labs ordered are listed, but only abnormal results are displayed)   Results for orders placed or performed during  the hospital encounter of 12/25/16  Lipase, blood  Result Value Ref Range   Lipase 40 11 - 51 U/L  Comprehensive metabolic panel  Result Value Ref Range   Sodium 135 135 - 145 mmol/L   Potassium 3.0 (L) 3.5 - 5.1 mmol/L   Chloride 95 (L) 101 - 111 mmol/L   CO2 23 22 - 32 mmol/L   Glucose, Bld 117 (H) 65 -  99 mg/dL   BUN 13 6 - 20 mg/dL   Creatinine, Ser 5.88 (H) 0.61 - 1.24 mg/dL   Calcium 9.0 8.9 - 10.3 mg/dL   Total Protein 8.4 (H) 6.5 - 8.1 g/dL   Albumin 4.3 3.5 - 5.0 g/dL   AST 20 15 - 41 U/L   ALT 18 17 - 63 U/L   Alkaline Phosphatase 75 38 - 126 U/L   Total Bilirubin 0.7 0.3 - 1.2 mg/dL   GFR calc non Af Amer 12 (L) >60 mL/min   GFR calc Af Amer 13 (L) >60 mL/min   Anion gap 17 (H) 5 - 15  CBC  Result Value Ref Range   WBC 10.7 (H) 4.0 - 10.5 K/uL   RBC 4.24 4.22 - 5.81 MIL/uL   Hemoglobin 13.2 13.0 - 17.0 g/dL   HCT 38.0 (L) 39.0 - 52.0 %   MCV 89.6 78.0 - 100.0 fL   MCH 31.1 26.0 - 34.0 pg   MCHC 34.7 30.0 - 36.0 g/dL   RDW 13.9 11.5 - 15.5 %   Platelets 241 150 - 400 K/uL  Troponin I  Result Value Ref Range   Troponin I <0.03 <0.03 ng/mL   Dg Chest 2 View  Result Date: 12/25/2016 CLINICAL DATA:  Acute onset of right-sided chest pain, nausea and vomiting. Initial encounter. EXAM: CHEST  2 VIEW COMPARISON:  Chest radiograph performed 12/23/2016 FINDINGS: The lungs are well-aerated and clear. There is no evidence of focal opacification, pleural effusion or pneumothorax. The heart is normal in size; the mediastinal contour is within normal limits. No acute osseous abnormalities are seen. Clips are noted within the right upper quadrant, reflecting prior cholecystectomy. IMPRESSION: No acute cardiopulmonary process seen. Electronically Signed   By: Garald Balding M.D.   On: 12/25/2016 21:25   Ct Abdomen Pelvis W Contrast  Result Date: 12/05/2016 CLINICAL DATA:  Status post cholecystectomy 11/23/2016. Right upper quadrant abdominal pain. End-stage renal disease on  hemodialysis. EXAM: CT ABDOMEN AND PELVIS WITH CONTRAST TECHNIQUE: Multidetector CT imaging of the abdomen and pelvis was performed using the standard protocol following bolus administration of intravenous contrast. CONTRAST:  121mL ISOVUE-300 IOPAMIDOL (ISOVUE-300) INJECTION 61% COMPARISON:  08/13/2016 CT abdomen/ pelvis.  11/19/2016 MRI abdomen. FINDINGS: Image quality is degraded by patient body habitus, particularly in the lower abdomen and pelvis. Lower chest: Right lower lobe 3 mm solid pulmonary nodule (series 4/image 7), stable since at least 12/27/2014, considered benign. Hepatobiliary: Normal liver with no liver mass. Cholecystectomy. No fluid collections in the cholecystectomy bed. No intrahepatic biliary ductal dilatation. Common bile duct diameter 6 mm, within expected post cholecystectomy limits. No radiopaque choledocholithiasis. Pancreas: Normal, with no mass or duct dilation. Spleen: Normal size. No mass. Adrenals/Urinary Tract: Normal adrenals. No hydronephrosis. No renal stones. Symmetric severe renal parenchymal atrophy. Numerous renal cysts in both kidneys, a few of which demonstrate punctate mural calcifications in, and several of which are subcentimeter and too small to accurately characterize. The largest renal cyst measures 3.5 cm in the posterior interpolar left kidney. Collapsed and grossly normal bladder. Stomach/Bowel: Grossly normal stomach. Normal caliber small bowel with no small bowel wall thickening. Normal appendix. Normal large bowel with no diverticulosis, large bowel wall thickening or pericolonic fat stranding. Vascular/Lymphatic: Normal caliber abdominal aorta. Patent portal, splenic, hepatic and diminutive renal veins. No pathologically enlarged lymph nodes in the abdomen or pelvis. Reproductive: Normal size prostate. Other: No pneumoperitoneum, ascites or focal fluid collection. Musculoskeletal: No aggressive appearing focal osseous lesions. IMPRESSION: 1.  No acute  abnormality. No fluid collections in the cholecystectomy bed. Bile ducts are within expected post cholecystectomy limits with common bile duct diameter 6 mm. No radiopaque choledocholithiasis. 2. No evidence of bowel obstruction or acute bowel inflammation. Normal appendix. 3. Polycystic atrophic kidneys. Electronically Signed   By: Ilona Sorrel M.D.   On: 12/05/2016 12:32   US Abdomen Limited  Result Date: 12/24/2016 CLINICAL DATA:  Upper abdominal pain for 1 day EXAM: US ABDOMEN LIMITED - RIGHT UPPER QUADRANT COMPARISON:  12/05/2016, 11/19/2016 FINDINGS: Gallbladder: Surgically absent Common bile duct: Diameter: Normal at 5.2 mm Liver: No focal lesion identified. Within normal limits in parenchymal echogenicity. Incidental note made of atrophic echogenic right kidney. IMPRESSION: 1. Surgical absence of gallbladder.  No biliary dilatation 2. Echogenic atrophic right kidney Electronically Signed   By: Donavan Foil M.D.   On: 12/24/2016 00:19   Dg Chest Port 1 View  Result Date: 12/05/2016 CLINICAL DATA:  Chest pain.  Abdominal pain, nausea and vomiting. EXAM: PORTABLE CHEST 1 VIEW COMPARISON:  10/08/2016 FINDINGS: The heart size and mediastinal contours are within normal limits. There is mild bibasilar atelectasis. There is no evidence of pulmonary edema, consolidation, pneumothorax, nodule or pleural fluid. The visualized skeletal structures are unremarkable. IMPRESSION: No active disease. Electronically Signed   By: Aletta Edouard M.D.   On: 12/05/2016 10:22   Dg Abd Acute W/chest  Result Date: 12/23/2016 CLINICAL DATA:  Abdominal pain and vomiting for 1 day. EXAM: DG ABDOMEN ACUTE W/ 1V CHEST COMPARISON:  CT abdomen pelvis 12/05/2016. Chest radiograph 12/05/2016. FINDINGS: Shallow inspiration. Heart size and pulmonary vascularity are normal. No focal airspace disease or consolidation in the lungs. No blunting of costophrenic angles. No pneumothorax. Surgical clips in the base of the neck. Scattered  gas and stool in the colon. No small or large bowel distention. No free intra-abdominal air. No abnormal air-fluid levels. Surgical clips in the right upper quadrant. No radiopaque stones. Calcified phleboliths in the pelvis. Visualized bones appear intact. Soft tissue contours are unremarkable. IMPRESSION: No evidence of active pulmonary disease. Normal nonobstructive bowel gas pattern. Electronically Signed   By: Lucienne Capers M.D.   On: 12/23/2016 22:16    EKG  EKG Interpretation None       Radiology Dg Chest 2 View  Result Date: 12/25/2016 CLINICAL DATA:  Acute onset of right-sided chest pain, nausea and vomiting. Initial encounter. EXAM: CHEST  2 VIEW COMPARISON:  Chest radiograph performed 12/23/2016 FINDINGS: The lungs are well-aerated and clear. There is no evidence of focal opacification, pleural effusion or pneumothorax. The heart is normal in size; the mediastinal contour is within normal limits. No acute osseous abnormalities are seen. Clips are noted within the right upper quadrant, reflecting prior cholecystectomy. IMPRESSION: No acute cardiopulmonary process seen. Electronically Signed   By: Garald Balding M.D.   On: 12/25/2016 21:25   US Abdomen Limited  Result Date: 12/24/2016 CLINICAL DATA:  Upper abdominal pain for 1 day EXAM: US ABDOMEN LIMITED - RIGHT UPPER QUADRANT COMPARISON:  12/05/2016, 11/19/2016 FINDINGS: Gallbladder: Surgically absent Common bile duct: Diameter: Normal at 5.2 mm Liver: No focal lesion identified. Within normal limits in parenchymal echogenicity. Incidental note made of atrophic echogenic right kidney. IMPRESSION: 1. Surgical absence of gallbladder.  No biliary dilatation 2. Echogenic atrophic right kidney Electronically Signed   By: Donavan Foil M.D.   On: 12/24/2016 00:19    Procedures Procedures (including critical care time)  Medications Ordered in ED Medications  LORazepam (ATIVAN) tablet 1  mg (not administered)  famotidine (PEPCID)  tablet 20 mg (not administered)  alum & mag hydroxide-simeth (MAALOX/MYLANTA) 200-200-20 MG/5ML suspension 15 mL (not administered)     Initial Impression / Assessment and Plan / ED Course  I have reviewed the triage vital signs and the nursing notes.  Pertinent labs & imaging results that were available during my care of the patient were reviewed by me and considered in my medical decision making (see chart for details).  Reviewed nursing notes and prior charts for additional history.   Multiple prior evals for same symptoms. Ct this month neg for acute process.   pepcid and maalox po.  Ativan po for anxiety.  Recheck pt  - resting comfortably, no distress. No recurrent emesis. abd is soft and non tender.  Pt currently appears stable for d/c.     Final Clinical Impressions(s) / ED Diagnoses   Final diagnoses:  None    New Prescriptions New Prescriptions   No medications on file     Lajean Saver, MD 12/26/16 1536

## 2016-12-25 NOTE — ED Triage Notes (Signed)
Pt presents to ED for assessment of upper abdominal pain starting today at 1600.  Was seen Wednesday for the same.  States it was as he was leaving Dialysis.  Pt current on dialysis visits at this time.  Pt c/o 4 episodes of vomiting, denies diarrhea.

## 2016-12-25 NOTE — Discharge Instructions (Signed)
It was our pleasure to provide your ER care today - we hope that you feel better.  Rest. Drink adequate fluids.  Follow up with your primary care doctor in the next few days.  Return to ER if worse, new symptoms, fevers, persistent vomiting, worsening or severe abdominal pain, other concern.  You were given pain/sedating medication in the ER - no driving for the next 6 hours.

## 2016-12-25 NOTE — ED Notes (Signed)
Patient brought back to triage from lobby by Merrily Pew, EMT who states patient had sudden onset of chest pain.  Patient pointing to his abdominal pain at this time.  EKG performed and iStat troponin ordered.

## 2016-12-30 ENCOUNTER — Encounter: Payer: Self-pay | Admitting: Physician Assistant

## 2016-12-30 ENCOUNTER — Ambulatory Visit (INDEPENDENT_AMBULATORY_CARE_PROVIDER_SITE_OTHER): Payer: Medicare Other | Admitting: Physician Assistant

## 2016-12-30 VITALS — BP 90/54 | HR 80 | Ht 66.0 in | Wt 320.6 lb

## 2016-12-30 DIAGNOSIS — R1010 Upper abdominal pain, unspecified: Secondary | ICD-10-CM | POA: Diagnosis not present

## 2016-12-30 DIAGNOSIS — G43A1 Cyclical vomiting, intractable: Secondary | ICD-10-CM | POA: Diagnosis not present

## 2016-12-30 DIAGNOSIS — R1115 Cyclical vomiting syndrome unrelated to migraine: Secondary | ICD-10-CM

## 2016-12-30 MED ORDER — METOCLOPRAMIDE HCL 5 MG PO TABS: ORAL_TABLET | ORAL | 3 refills | Status: DC

## 2016-12-30 MED ORDER — ONDANSETRON 4 MG PO TBDP: ORAL_TABLET | ORAL | 3 refills | Status: DC

## 2016-12-30 NOTE — Progress Notes (Signed)
Subjective:    Patient ID: [name], male    DOB: [birth year], 31 y.o.   MRN: [id number]  HPI [name] is a 31 year old African-American male known to Dr. Loletha Carrow. He is felt to have a cyclic vomiting syndrome and may have component of gastroparesis. He has end-stage renal disease and has been on dialysis since age 25. Also with hypertension and morbid obesity. His symptoms started several years ago with recurrent nausea and vomiting. He had had a lap band placed for obesity several years ago but due to the nausea and vomiting this was removed. He had an admission in January 2018 and underwent EGD with Dr. Hilarie Fredrickson at that time which was negative other than some retained fluid in the stomach. Biopsies were negative for H. pylori. He had admissions in March and April 2018 and has had 3 ER visits in May 2018. He had had previously documented cholelithiasis and underwent laparoscopic cholecystectomy in April 2018 but his symptoms are persisting. CT of the abdomen and pelvis on 12/05/2016 normal with the exception of polycystic kidneys and upper abdominal ultrasound on 12/23/2016 was unremarkable. t At the ime of last office visit with Dr. Loletha Carrow in January 2018 he had been started on Reglan while he was hospitalized and that was continued though decreased to 5 mg by mouth twice a day. He is maintained on omeprazole 20 mg by mouth daily. He has prescription for Zofran but says he's generally just taking that once daily in the morning. He had been given hydrocodone at the time of his last ER visit and says that definitely helps his abdominal pain but he has run out. He is also run out of Reglan over this past week. He is currently keeping down by mouth's but sticking primarily to liquids and soup. He describes intermittent episodes of sharp upper abdominal pain sometimes worse at night. He denies being nauseated 24 7 but says this seems to be intermittent.  Review of Systems Pertinent positive and negative  review of systems were noted in the above HPI section.  All other review of systems was otherwise negative.  Outpatient Encounter Prescriptions as of 12/30/2016  Medication Sig  . albuterol (PROVENTIL HFA;VENTOLIN HFA) 108 (90 Base) MCG/ACT inhaler Inhale 2 puffs into the lungs every 4 (four) hours as needed for wheezing or shortness of breath.  . calcium carbonate (TUMS - DOSED IN MG ELEMENTAL CALCIUM) 500 MG chewable tablet Chew 2 tablets by mouth 2 (two) times daily as needed for indigestion or heartburn.   Marland Kitchen HYDROcodone-acetaminophen (NORCO/VICODIN) 5-325 MG tablet Take 1-2 tablets by mouth every 6 (six) hours as needed for severe pain.  . methocarbamol (ROBAXIN) 500 MG tablet Take 1 tablet (500 mg total) by mouth every 8 (eight) hours as needed for muscle spasms.  . metoCLOPramide (REGLAN) 5 MG tablet Take  Tab 3 times daily.  . multivitamin (RENA-VIT) TABS tablet Take 1 tablet by mouth daily.  Marland Kitchen omeprazole (PRILOSEC) 20 MG capsule Take 1 capsule (20 mg total) by mouth daily.  . ondansetron (ZOFRAN ODT) 4 MG disintegrating tablet Take 1 tablet 3 times daily regularly.  . sevelamer carbonate (RENVELA) 2.4 G PACK Take 2.4 g by mouth 3 (three) times daily with meals.   . traMADol (ULTRAM) 50 MG tablet Take 1 tablet (50 mg total) by mouth every 8 (eight) hours as needed. (Patient taking differently: Take 50 mg by mouth every 6 (six) hours as needed (for pain). )  . [DISCONTINUED] dicyclomine (BENTYL) 20 MG tablet  Take 1 tablet (20 mg total) by mouth 3 (three) times daily before meals. As needed for abdominal cramping  . [DISCONTINUED] glycopyrrolate (ROBINUL) 2 MG tablet Take 1 tablet (2 mg total) by mouth 2 (two) times daily.  . [DISCONTINUED] Hyoscyamine Sulfate 0.375 MG TBCR Take 1 tablet twice daily (Patient taking differently: Take 0.375 mg by mouth 2 (two) times daily. )  . [DISCONTINUED] lidocaine (XYLOCAINE) 2 % jelly Apply 1 application topically as needed. (Patient taking differently:  Apply 1 application topically daily as needed (pain). )  . [DISCONTINUED] metoCLOPramide (REGLAN) 5 MG tablet Take 1 tablet (5 mg total) by mouth 2 (two) times daily before a meal.  . [DISCONTINUED] omeprazole (PRILOSEC) 40 MG capsule Take 1 capsule (40 mg total) by mouth daily.  . [DISCONTINUED] ondansetron (ZOFRAN ODT) 4 MG disintegrating tablet Take 1 tablet (4 mg total) by mouth every 8 (eight) hours as needed for nausea or vomiting.  . [DISCONTINUED] ondansetron (ZOFRAN ODT) 4 MG disintegrating tablet Take 1 tablet (4 mg total) by mouth every 8 (eight) hours as needed for nausea or vomiting.  . [DISCONTINUED] ondansetron (ZOFRAN) 4 MG tablet Take 1 tablet (4 mg total) by mouth every 6 (six) hours.   No facility-administered encounter medications on file as of 12/30/2016.    Allergies  Allergen Reactions  . Dilaudid [Hydromorphone Hcl] Other (See Comments)    Patient became hypoxic to 60% with dilaudid 1mg  IV   Patient Active Problem List   Diagnosis Date Noted  . RUQ abdominal pain 11/20/2016  . RUQ pain 11/19/2016  . Gastritis 11/19/2016  . Increased anion gap metabolic acidosis 68/06/5725  . Intractable abdominal pain   . Common bile duct dilatation   . Cholelithiasis without cholecystitis   . ?? Gastroparesis- NORMAL NM gastric emptying study 2016   . Intractable nausea and vomiting 10/08/2016  . Hematemesis 08/14/2016  . GERD (gastroesophageal reflux disease) 08/14/2016  . Essential hypertension 08/14/2016  . GIB (gastrointestinal bleeding) 08/14/2016  . SIRS (systemic inflammatory response syndrome) ([place]) 08/14/2016  . CKD (chronic kidney disease) stage V requiring chronic dialysis ([place]) 06/11/2016  . Non-intractable vomiting with nausea 06/11/2016  . Hepatic steatosis 06/11/2016  . Cholelithiasis 06/11/2016  . Localized osteoarthritis of left knee 07/11/2015  . Abdominal pain, epigastric 12/05/2014  . Lapband April 2016 11/27/2014  . Arthritis of knee 03/27/2014  .  Morbid obesity, weight 291, BMI - 47 02/15/2014  . Hyperparathyroidism, secondary ([place]) 03/14/2013   Social History   Social History  . Marital status: Single    Spouse name: N/A  . Number of children: 0  . Years of education: N/A   Occupational History  . Not on file.   Social History Main Topics  . Smoking status: Current Every Day Smoker    Packs/day: 0.10    Years: 16.00    Types: Cigarettes    Last attempt to quit: 12/17/2014  . Smokeless tobacco: Never Used  . Alcohol use No  . Drug use: No  . Sexual activity: Yes   Other Topics Concern  . Not on file   Social History Narrative  . No narrative on file    [name] family history includes Diabetes in his father and maternal grandmother; Hypertension in his mother; Kidney Stones in his sister; Liver cancer in his maternal uncle.      Objective:    Vitals:   12/30/16 1156  BP: (!) 90/54  Pulse: 80    Physical Exam  well-developed morbidly obese young  African-American male in no acute distress, pleasant blood pressure 90/54, pulse 80, height 5 foot 6, weight 320, BMI 51.7. HEENT ;nontraumatic, cephalic EOMI PERRLA sclera anicteric, Cardiovascular; regular rate and rhythm with S1-S2 no murmur or gallop, Pulmonary; clear bilaterally, Abdomen ;morbidly obese, soft, no focal tenderness no guarding or rebound no palpable mass or hepatosplenomegaly, Rectal;exam not done, Extremities ;no clubbing cyanosis or edema skin warm and dry, Neuropsych; mood and affect appropriate       Assessment & Plan:   #37 31 year old African-American male with end-stage renal disease on dialysis greater than 10 years with long-standing history of recurrent abdominal pain nausea and vomiting. He is felt to have a cyclic vomiting type syndrome probably exacerbated by renal failure Gastric emptying scan 2016 was normal Patient is now status post cholecystectomy April 2018 with no improvement in symptoms EGD January 2018 negative with the  exception of a small amount of retained fluid  #2 morbid obesity #3 hypertension  Plan; continue step 3 gastroparesis type diet Continue metoclopramide 5 mg but increase to 3 times daily regularly Increase Zofran to 4 mg by mouth 3 times daily, this can be taken along with metoclopramide Patient was told we would not prescribe hydrocodone for his abdominal discomfort and nausea as in the long run this will not be helpful and will likely increase his symptoms Patient has dicyclomine , Levsin and Robinul on his last but does not think that he is taking any of these regularly. Will discontinue all anti-spasmodic's We will arrange follow-up office visit with Dr. Loletha Carrow in 4-6 Lieb.  Kagen Kunath S Laurelle Skiver [name] 12/30/2016   Cc: Nolene Ebbs, MD

## 2016-12-30 NOTE — Progress Notes (Signed)
Thank you for sending this case to me. I have reviewed the entire note, and the outlined plan seems appropriate.  Unfortunately, he did not keep an appointment schedule with me within the last 2 months.    I feel he needs reglan at appropriate renal dose. He should have compazine suppositories for breakthrough symptoms and hopefully keep him out of the ED.  He has no structural , obstructive or mucosal cause.  Thus, this appears to be a motility issue.  Tramadol can be used for the pain of gastroparesis/dysmotility since I agree that opioids are ill-advised.  Wilfrid Lund, MD

## 2016-12-30 NOTE — Progress Notes (Signed)
Please call pt and let him know we will also call in  Compazine supp 10 mg  for prn use when he cannot keep down po's so can try to avoid ER trips  1 box /6 refills.

## 2016-12-30 NOTE — Patient Instructions (Addendum)
We have sent the following medications to your pharmacy for you to pick up at your convenience: 1. Zofran 4 mg.  2. Reglan 5 mg.   Continue Prilosec 20 mg, take 1 tab by mouth every morning.   Stop Dicyclomine Stop Hyoscyamine Stop Robinul   Call us in [place] for an appointment for the end of June with Amy Esterwood [name].

## 2017-01-01 ENCOUNTER — Other Ambulatory Visit: Payer: Self-pay

## 2017-01-01 MED ORDER — PROCHLORPERAZINE 25 MG RE SUPP: 25.0000 mg | Freq: Two times a day (BID) | RECTAL | 6 refills | Status: DC | PRN

## 2017-01-01 NOTE — Progress Notes (Signed)
Patient contacted. He agrees with this plan. Rx to pharmacy. Appointment made. He thanks me for my call.

## 2017-01-02 ENCOUNTER — Encounter (HOSPITAL_COMMUNITY): Payer: Self-pay

## 2017-01-02 ENCOUNTER — Other Ambulatory Visit: Payer: Self-pay

## 2017-01-02 ENCOUNTER — Emergency Department (HOSPITAL_COMMUNITY)
Admission: EM | Admit: 2017-01-02 | Discharge: 2017-01-02 | Disposition: A | Discharge status: Home / Self Care | Payer: Medicare Other | Attending: Emergency Medicine | Admitting: Emergency Medicine

## 2017-01-02 DIAGNOSIS — I120 Hypertensive chronic kidney disease with stage 5 chronic kidney disease or end stage renal disease: Secondary | ICD-10-CM | POA: Diagnosis not present

## 2017-01-02 DIAGNOSIS — F17210 Nicotine dependence, cigarettes, uncomplicated: Secondary | ICD-10-CM | POA: Diagnosis not present

## 2017-01-02 DIAGNOSIS — K3184 Gastroparesis: Secondary | ICD-10-CM

## 2017-01-02 DIAGNOSIS — Z79899 Other long term (current) drug therapy: Secondary | ICD-10-CM | POA: Diagnosis not present

## 2017-01-02 DIAGNOSIS — R1115 Cyclical vomiting syndrome unrelated to migraine: Secondary | ICD-10-CM

## 2017-01-02 DIAGNOSIS — R109 Unspecified abdominal pain: Secondary | ICD-10-CM | POA: Diagnosis present

## 2017-01-02 DIAGNOSIS — N186 End stage renal disease: Secondary | ICD-10-CM | POA: Insufficient documentation

## 2017-01-02 LAB — CBC
HCT: 38.5 % — ABNORMAL LOW (ref 39.0–52.0)
Hemoglobin: 13.4 g/dL (ref 13.0–17.0)
MCH: 31.2 pg (ref 26.0–34.0)
MCHC: 34.8 g/dL (ref 30.0–36.0)
MCV: 89.5 fL (ref 78.0–100.0)
PLATELETS: 271 10*3/uL (ref 150–400)
RBC: 4.3 MIL/uL (ref 4.22–5.81)
RDW: 14 % (ref 11.5–15.5)
WBC: 12 10*3/uL — AB (ref 4.0–10.5)

## 2017-01-02 LAB — COMPREHENSIVE METABOLIC PANEL
ALT: 24 U/L (ref 17–63)
AST: 19 U/L (ref 15–41)
Albumin: 4.5 g/dL (ref 3.5–5.0)
Alkaline Phosphatase: 74 U/L (ref 38–126)
Anion gap: 17 — ABNORMAL HIGH (ref 5–15)
BILIRUBIN TOTAL: 0.8 mg/dL (ref 0.3–1.2)
BUN: 30 mg/dL — ABNORMAL HIGH (ref 6–20)
CO2: 29 mmol/L (ref 22–32)
CREATININE: 8.2 mg/dL — AB (ref 0.61–1.24)
Calcium: 8.9 mg/dL (ref 8.9–10.3)
Chloride: 93 mmol/L — ABNORMAL LOW (ref 101–111)
GFR calc Af Amer: 9 mL/min — ABNORMAL LOW (ref >60)
GFR, EST NON AFRICAN AMERICAN: 8 mL/min — AB (ref >60)
Glucose, Bld: 111 mg/dL — ABNORMAL HIGH (ref 65–99)
POTASSIUM: 3.2 mmol/L — AB (ref 3.5–5.1)
Sodium: 139 mmol/L (ref 135–145)
TOTAL PROTEIN: 8.9 g/dL — AB (ref 6.5–8.1)

## 2017-01-02 LAB — I-STAT CG4 LACTIC ACID, ED: LACTIC ACID, VENOUS: 1.66 mmol/L (ref 0.5–1.9)

## 2017-01-02 LAB — LIPASE, BLOOD: Lipase: 29 U/L (ref 11–51)

## 2017-01-02 MED ORDER — PROMETHAZINE HCL 25 MG RE SUPP: 25.0000 mg | Freq: Three times a day (TID) | RECTAL | 0 refills | Status: DC | PRN

## 2017-01-02 MED ORDER — DIPHENHYDRAMINE HCL 50 MG/ML IJ SOLN
25.0000 mg | Freq: Once | INTRAMUSCULAR | Status: AC
  Administered 2017-01-02: 25 mg via INTRAVENOUS
  Filled 2017-01-02: qty 1

## 2017-01-02 MED ORDER — FAMOTIDINE 20 MG PO TABS: 20.0000 mg | ORAL_TABLET | Freq: Every day | ORAL | 0 refills | Status: DC

## 2017-01-02 MED ORDER — FENTANYL CITRATE (PF) 100 MCG/2ML IJ SOLN
50.0000 ug | Freq: Once | INTRAMUSCULAR | Status: AC
  Administered 2017-01-02: 50 ug via INTRAVENOUS
  Filled 2017-01-02: qty 2

## 2017-01-02 MED ORDER — FAMOTIDINE IN NACL 20-0.9 MG/50ML-% IV SOLN
20.0000 mg | Freq: Once | INTRAVENOUS | Status: AC
  Administered 2017-01-02: 20 mg via INTRAVENOUS
  Filled 2017-01-02: qty 50

## 2017-01-02 MED ORDER — METOCLOPRAMIDE HCL 5 MG PO TABS: 5.0000 mg | ORAL_TABLET | Freq: Three times a day (TID) | ORAL | 0 refills | Status: DC | PRN

## 2017-01-02 MED ORDER — METOCLOPRAMIDE HCL 5 MG/ML IJ SOLN
10.0000 mg | Freq: Once | INTRAMUSCULAR | Status: AC
  Administered 2017-01-02: 10 mg via INTRAVENOUS
  Filled 2017-01-02: qty 2

## 2017-01-02 MED ORDER — SODIUM CHLORIDE 0.9 % IV BOLUS (SEPSIS)
250.0000 mL | Freq: Once | INTRAVENOUS | Status: AC
  Administered 2017-01-02: 250 mL via INTRAVENOUS

## 2017-01-02 MED ORDER — HALOPERIDOL LACTATE 5 MG/ML IJ SOLN
2.0000 mg | Freq: Once | INTRAMUSCULAR | Status: AC
  Administered 2017-01-02: 2 mg via INTRAVENOUS
  Filled 2017-01-02: qty 1

## 2017-01-02 NOTE — ED Notes (Signed)
LAB DRAW UNSUCCESSFUL

## 2017-01-02 NOTE — ED Notes (Addendum)
Water at bedside and tolerating well.

## 2017-01-02 NOTE — ED Provider Notes (Signed)
[facility] DEPT Provider Note   CSN: [id number] Arrival date & time: 01/02/17  8185     History   Chief Complaint Chief Complaint  Patient presents with  . Abdominal Pain  . Emesis    HPI [name] is a 31 y.o. male.  HPI   31 yo M with PMHx HTN, ESRD, morbid obesity, chronic abdominal pain here with n/v and abdominal pain. Pt states that his sx started yesterday after dialysis. He had not eaten/drinken anything. He reported gradual onset of progressively worsening severe aching, gnawing epigastric pain. He began vomiting a small amount of yellow-green bile/stomach contents then has had occasional blood-tinged emesis, which is not new fo rhim. No grossly bloody emesis. Pain worse with movement and palpation. He has been unable to eat or drink since the onset of pain. He has had no diarrhea. Pain feels similar to his prior episodes of gastroparesis/chronic abdominal pain. No other recent changes in health. No fever or chills. No dysuria.  Past Medical History:  Diagnosis Date  . Arthritis    "all over" (11/19/2016)  . Chronic lower back pain   . Complication of anesthesia    A little while to wake up after knee surgery in 2008  . Dizziness    when coming off of dialysis  . ESRD (end stage renal disease) on dialysis [facility])    MWF and goes to [facility] (11/19/2016)  . Family history of anesthesia complication    mom slow to wake up  . GERD (gastroesophageal reflux disease)    takes Omeprazole as needed  . UDJSHFWY(637.8)    "monthly" (11/19/2016)  . History of hiatal hernia   . Hyperparathyroidism ([place])   . Hypertension   . Joint pain   . Joint swelling   . Morbid obesity ([place])   . PONV (postoperative nausea and vomiting)   . Renal insufficiency    Pt is on dyalisis and has been for 14 years    Patient Active Problem List   Diagnosis Date Noted  . RUQ abdominal pain 11/20/2016  . RUQ pain 11/19/2016  . Gastritis 11/19/2016  . Increased anion gap metabolic  acidosis 58/85/0277  . Intractable abdominal pain   . Common bile duct dilatation   . Cholelithiasis without cholecystitis   . ?? Gastroparesis- NORMAL NM gastric emptying study 2016   . Intractable nausea and vomiting 10/08/2016  . Hematemesis 08/14/2016  . GERD (gastroesophageal reflux disease) 08/14/2016  . Essential hypertension 08/14/2016  . GIB (gastrointestinal bleeding) 08/14/2016  . SIRS (systemic inflammatory response syndrome) ([place]) 08/14/2016  . CKD (chronic kidney disease) stage V requiring chronic dialysis ([place]) 06/11/2016  . Non-intractable vomiting with nausea 06/11/2016  . Hepatic steatosis 06/11/2016  . Cholelithiasis 06/11/2016  . Localized osteoarthritis of left knee 07/11/2015  . Abdominal pain, epigastric 12/05/2014  . Lapband April 2016 11/27/2014  . Arthritis of knee 03/27/2014  . Morbid obesity, weight 291, BMI - 47 02/15/2014  . Hyperparathyroidism, secondary ([place]) 03/14/2013    Past Surgical History:  Procedure Laterality Date  . AV FISTULA PLACEMENT Bilateral 2005,2007   "right; left"  . AV FISTULA REPAIR Right 2007   "tried to clean it out; couldn't do it"  . BREATH TEK H PYLORI N/A 05/15/2014   Procedure: BREATH TEK H PYLORI;  Surgeon: Alphonsa Overall, MD;  Location: [facility];  Service: General;  Laterality: N/A;  . CHOLECYSTECTOMY N/A 11/23/2016   Procedure: LAPAROSCOPIC CHOLECYSTECTOMY WITH INTRAOPERATIVE CHOLANGIOGRAM;  Surgeon: Erroll Luna, MD;  Location[facility]  OR;  Service: General;  Laterality: N/A;  . ESOPHAGOGASTRODUODENOSCOPY N/A 12/07/2014   Procedure: ESOPHAGOGASTRODUODENOSCOPY (EGD);  Surgeon: Alphonsa Overall, MD;  Location: [facility] ENDOSCOPY;  Service: General;  Laterality: N/A;  . ESOPHAGOGASTRODUODENOSCOPY N/A 12/17/2014   Procedure: ESOPHAGOGASTRODUODENOSCOPY (EGD);  Surgeon: Alphonsa Overall, MD;  Location: [facility];  Service: General;  Laterality: N/A;  . ESOPHAGOGASTRODUODENOSCOPY N/A 08/16/2016   Procedure: ESOPHAGOGASTRODUODENOSCOPY (EGD);   Surgeon: Jerene Bears, MD;  Location: [facility];  Service: Gastroenterology;  Laterality: N/A;  . HERNIA REPAIR  11/2014   w/lap band OR  . JOINT REPLACEMENT    . KNEE ARTHROSCOPY Left 2007  . LAPAROSCOPIC GASTRIC BANDING N/A 11/12/2014   Procedure: LAPAROSCOPIC GASTRIC BANDING;  Surgeon: Alphonsa Overall, MD;  Location: [facility] ORS;  Service: General;  Laterality: N/A;  . PARATHYROIDECTOMY N/A 03/30/2013   Procedure: TOTAL PARATHYROIDECTOMY WITH AUTOTRANSPLANT;  Surgeon: Earnstine Regal, MD;  Location: [facility];  Service: General;  Laterality: N/A;  Autotransplant to left lower arm.  Marland Kitchen TOTAL KNEE ARTHROPLASTY Right 03/27/2014   Procedure: UNICOMPARTMENTAL ARTHROPLASTY;  Surgeon: Meredith Pel, MD;  Location: [facility];  Service: Orthopedics;  Laterality: Right;       Home Medications    Prior to Admission medications   Medication Sig Start Date End Date Taking? Authorizing Provider  albuterol (PROVENTIL HFA;VENTOLIN HFA) 108 (90 Base) MCG/ACT inhaler Inhale 2 puffs into the lungs every 4 (four) hours as needed for wheezing or shortness of breath. 05/14/16   Lysbeth Penner, [name]  calcium carbonate (TUMS - DOSED IN MG ELEMENTAL CALCIUM) 500 MG chewable tablet Chew 2 tablets by mouth 2 (two) times daily as needed for indigestion or heartburn.     [provider]  famotidine (PEPCID) 20 MG tablet Take 1 tablet (20 mg total) by mouth daily. 01/02/17 01/09/17  Duffy Bruce, MD  HYDROcodone-acetaminophen (NORCO/VICODIN) 5-325 MG tablet Take 1-2 tablets by mouth every 6 (six) hours as needed for severe pain. 12/05/16   Law, Bea Graff, [name]  methocarbamol (ROBAXIN) 500 MG tablet Take 1 tablet (500 mg total) by mouth every 8 (eight) hours as needed for muscle spasms. 06/15/16   Theodis Blaze, MD  metoCLOPramide (REGLAN) 5 MG tablet Take 1 tablet (5 mg total) by mouth every 8 (eight) hours as needed for nausea or vomiting. 01/02/17   Duffy Bruce, MD  multivitamin (RENA-VIT) TABS tablet Take 1 tablet  by mouth daily.    [provider]  omeprazole (PRILOSEC) 20 MG capsule Take 1 capsule (20 mg total) by mouth daily. 07/27/16   Rancour, Annie Main, MD  ondansetron (ZOFRAN ODT) 4 MG disintegrating tablet Take 1 tablet 3 times daily regularly. 12/30/16   Esterwood, Amy S, [name]  prochlorperazine (COMPAZINE) 25 MG suppository Place 1 suppository (25 mg total) rectally every 12 (twelve) hours as needed for nausea or vomiting. 01/01/17   Esterwood, Amy S, [name]  promethazine (PHENERGAN) 25 MG suppository Place 1 suppository (25 mg total) rectally every 8 (eight) hours as needed for refractory nausea / vomiting. 01/02/17   Duffy Bruce, MD  sevelamer carbonate (RENVELA) 2.4 G PACK Take 2.4 g by mouth 3 (three) times daily with meals.     [provider]  traMADol (ULTRAM) 50 MG tablet Take 1 tablet (50 mg total) by mouth every 8 (eight) hours as needed. Patient taking differently: Take 50 mg by mouth every 6 (six) hours as needed (for pain).  06/15/16   Theodis Blaze, MD    Family History Family History  Problem Relation Age of Onset  . Hypertension Mother   . Diabetes Father   . Kidney Stones Sister   . Diabetes Maternal Grandmother   . Liver cancer Maternal Uncle     Social History Social History  Substance Use Topics  . Smoking status: Current Every Day Smoker    Packs/day: 0.10    Years: 16.00    Types: Cigarettes    Last attempt to quit: 12/17/2014  . Smokeless tobacco: Never Used  . Alcohol use No     Allergies   Dilaudid [hydromorphone hcl]   Review of Systems Review of Systems  Constitutional: Positive for fatigue.  Gastrointestinal: Positive for abdominal pain, nausea and vomiting.  Neurological: Positive for weakness.  All other systems reviewed and are negative.    Physical Exam Updated Vital Signs BP (!) 95/55 (BP Location: Right Arm)   Pulse 87   Temp 98.1 F (36.7 C) (Oral)   Resp 16   SpO2 94%   Physical Exam  Constitutional: He is  oriented to person, place, and time. He appears well-developed and well-nourished. No distress.  HENT:  Head: Normocephalic and atraumatic.  Eyes: Conjunctivae are normal.  Neck: Neck supple.  Cardiovascular: Normal rate, regular rhythm and normal heart sounds.  Exam reveals no friction rub.   No murmur heard. Pulmonary/Chest: Effort normal and breath sounds normal. No respiratory distress. He has no wheezes. He has no rales.  Abdominal: Normal appearance. He exhibits no distension. There is generalized tenderness and tenderness in the epigastric area. There is no rigidity, no rebound and no guarding.  Diffuse, distractible abdominal pain, worse in epigastric area  Musculoskeletal: He exhibits no edema.  Neurological: He is alert and oriented to person, place, and time. He exhibits normal muscle tone.  Skin: Skin is warm. Capillary refill takes less than 2 seconds.  Psychiatric: He has a normal mood and affect.  Nursing note and vitals reviewed.    ED Treatments / Results  Labs (all labs ordered are listed, but only abnormal results are displayed) Labs Reviewed  COMPREHENSIVE METABOLIC PANEL - Abnormal; Notable for the following:       Result Value   Potassium 3.2 (*)    Chloride 93 (*)    Glucose, Bld 111 (*)    BUN 30 (*)    Creatinine, Ser 8.20 (*)    Total Protein 8.9 (*)    GFR calc non Af Amer 8 (*)    GFR calc Af Amer 9 (*)    Anion gap 17 (*)    All other components within normal limits  CBC - Abnormal; Notable for the following:    WBC 12.0 (*)    HCT 38.5 (*)    All other components within normal limits  LIPASE, BLOOD  I-STAT CG4 LACTIC ACID, ED    EKG  EKG Interpretation None       Radiology No results found.  Procedures Procedures (including critical care time)  Medications Ordered in ED Medications  metoCLOPramide (REGLAN) injection 10 mg (10 mg Intravenous Given 01/02/17 0923)  diphenhydrAMINE (BENADRYL) injection 25 mg (25 mg Intravenous Given  01/02/17 0923)  famotidine (PEPCID) IVPB 20 mg premix (0 mg Intravenous Stopped 01/02/17 1014)  fentaNYL (SUBLIMAZE) injection 50 mcg (50 mcg Intravenous Given 01/02/17 0923)  haloperidol lactate (HALDOL) injection 2 mg (2 mg Intravenous Given 01/02/17 1049)  sodium chloride 0.9 % bolus 250 mL (0 mLs Intravenous Stopped 01/02/17 1209)     Initial Impression / Assessment and Plan /  ED Course  I have reviewed the triage vital signs and the nursing notes.  Pertinent labs & imaging results that were available during my care of the patient were reviewed by me and considered in my medical decision making (see chart for details).    31 yo M with extensive history including ESRD, h/o recurrent gastroparesis and chornic abd pain here with acute on chronic abd pain. Suspect recurrence of gastroparesis. Labs are reassuring and at baseline. He has a mild leukocytosis which is not uncommon for him and he has no fevers, normal LA, no evidence to suggest intra-abdominal emergency. Patient feels markedly improved after multiple doses of antiemetics. His lab work is largely reassuring and near his baseline. His abdomen is now soft, nontender, nondistended, and he is tolerating both liquid as well as solids. He is ambulatory at his baseline without difficulty. I suspect the patient has acute on chronic gastroparesis and chronic abdominal pain. He is artery on Bentyl for this. Will discharge with Reglan and supportive care and could return precautions. Patient will return if he is unable to eat or drink or has recurrence of severe pain.  Final Clinical Impressions(s) / ED Diagnoses   Final diagnoses:  Gastroparesis  Non-intractable cyclical vomiting with nausea    New Prescriptions Discharge Medication List as of 01/02/2017 12:35 PM    START taking these medications   Details  famotidine (PEPCID) 20 MG tablet Take 1 tablet (20 mg total) by mouth daily., Starting Sat 01/02/2017, Until Sat 01/09/2017, Print    promethazine  (PHENERGAN) 25 MG suppository Place 1 suppository (25 mg total) rectally every 8 (eight) hours as needed for refractory nausea / vomiting., Starting Sat 01/02/2017, Print         Duffy Bruce, MD 01/03/17 [phone]

## 2017-01-02 NOTE — ED Notes (Signed)
Pt has not thrown up since he has gotten to room. Resting with eyes closed.

## 2017-01-02 NOTE — ED Triage Notes (Signed)
Pt states abdominal pain with n/v since last night.  Denies fever or urinary symptoms.

## 2017-01-02 NOTE — ED Notes (Signed)
Patient ambulated about 60 feet and tolerated well.

## 2017-01-02 NOTE — ED Notes (Signed)
Gave pt crackers.

## 2017-01-02 NOTE — ED Notes (Signed)
Pt reports he does not urinate with his dialysis

## 2017-01-04 NOTE — Addendum Note (Signed)
Addendum  created 01/04/17 1004 by Myrtie Soman, MD   Sign clinical note

## 2017-01-06 ENCOUNTER — Ambulatory Visit (INDEPENDENT_AMBULATORY_CARE_PROVIDER_SITE_OTHER): Payer: Medicare Other | Admitting: Gastroenterology

## 2017-01-06 ENCOUNTER — Encounter: Payer: Self-pay | Admitting: Gastroenterology

## 2017-01-06 VITALS — BP 80/52 | HR 88 | Ht 66.0 in | Wt 317.2 lb

## 2017-01-06 DIAGNOSIS — R112 Nausea with vomiting, unspecified: Secondary | ICD-10-CM

## 2017-01-06 DIAGNOSIS — N186 End stage renal disease: Secondary | ICD-10-CM | POA: Diagnosis not present

## 2017-01-06 DIAGNOSIS — Z992 Dependence on renal dialysis: Secondary | ICD-10-CM

## 2017-01-06 NOTE — Progress Notes (Signed)
[facility] Progress Note  Chief Complaint: Nausea and vomiting  Subjective  History:  [name] is here to see me after his last clinic visit with our PA a week ago. He has been to the ED once more since then for upper abdominal pain nausea and vomiting. This has been a long-standing problem that appears related to his chronic kidney disease and hemodialysis. Had a gastric emptying study normal in 2016, but it is not clear if he was on or off prokinetics at that point. He has frequent episodes of vomiting with associated epigastric pain to bring him to the ED frequently. He is currently on Reglan 5 mg 3 times daily, Zofran has just been increased to 8 mg 3 times daily, and he was given Compazine suppositories last week that he is not yet used. He was given dicyclomine during the most recent ED visit, and just started yesterday. Primary care asked continue to give him as needed hydrocodone for the pain, and I have strongly advised him to stop that becaue it is most likely making this condition worse. He can use tramadol but not opioids. ROS: Cardiovascular:  no chest pain Respiratory: no dyspnea Remainder systems negative except as above The patient's Past Medical, Family and Social History were reviewed and are on file in the EMR.  Objective:  Med list reviewed  Vital signs in last 24 hrs: Vitals:   01/06/17 1136  BP: (!) 80/52  Pulse: 88    Physical Exam  Morbidly obese young man in no acute distress  HEENT: sclera anicteric, oral mucosa moist without lesions  Neck: supple, no thyromegaly, JVD or lymphadenopathy  Cardiac: RRR without murmurs, S1S2 heard, no peripheral edema  Pulm: clear to auscultation bilaterally, normal RR and effort noted  Abdomen: soft, mild bandlike upper abdominal tenderness, with active bowel sounds. No guarding or palpable hepatosplenomegaly.  Skin; warm and dry, no jaundice or rash  Recent Labs:  See extensive recent ED labs.  CBC    Component Value Date/Time   WBC 12.0 (H) 01/02/2017 0910   RBC 4.30 01/02/2017 0910   HGB 13.4 01/02/2017 0910   HCT 38.5 (L) 01/02/2017 0910   PLT 271 01/02/2017 0910   MCV 89.5 01/02/2017 0910   MCH 31.2 01/02/2017 0910   MCHC 34.8 01/02/2017 0910   RDW 14.0 01/02/2017 0910   LYMPHSABS 1.5 12/05/2016 0930   MONOABS 0.6 12/05/2016 0930   EOSABS 0.1 12/05/2016 0930   BASOSABS 0.0 12/05/2016 0930   CMP Latest Ref Rng & Units 01/02/2017 12/25/2016 12/23/2016  Glucose 65 - 99 mg/dL 111(H) 117(H) 109(H)  BUN 6 - 20 mg/dL 30(H) 13 29(H)  Creatinine 0.61 - 1.24 mg/dL 8.20(H) 5.88(H) 7.67(H)  Sodium 135 - 145 mmol/L 139 135 134(L)  Potassium 3.5 - 5.1 mmol/L 3.2(L) 3.0(L) 3.3(L)  Chloride 101 - 111 mmol/L 93(L) 95(L) 95(L)  CO2 22 - 32 mmol/L 29 23 24   Calcium 8.9 - 10.3 mg/dL 8.9 9.0 9.0  Total Protein 6.5 - 8.1 g/dL 8.9(H) 8.4(H) 7.6  Total Bilirubin 0.3 - 1.2 mg/dL 0.8 0.7 0.7  Alkaline Phos 38 - 126 U/L 74 75 65  AST 15 - 41 U/L 19 20 17   ALT 17 - 63 U/L 24 18 16(L)     Radiologic studies:  Recent CT abdomen and pelvis during an ED visit showed no structural, inflammatory or obstructive cause for these symptoms. He has also had multiple CT scans in the past.  @ASSESSMENTPLANBEGIN @ Assessment: Encounter Diagnoses  Name Primary?  . Non-intractable vomiting with nausea, unspecified vomiting type Yes  . CKD (chronic kidney disease) stage V requiring chronic dialysis ([place])     I still believe he has either gastroparesis or a gastric dysmotility related to his chronic kidney disease and effective hemodialysis. He is on maximal medical therapy at present. He knows to use the suppositories as needed to hopefully abort severe episodes or putting him to the ED. Cholecystectomy did not relieve his symptoms, he had an unrevealing upper endoscopy earlier this year.  Again, I advise against the ongoing use of opioid medicines for this chronic pain.  Plan:  I will refill his  antiemetics as needed and he will see me as needed. If he is able to lose a substantial amount of weight, perhaps it will become a candidate for renal transplantation and no longer any dialysis.  Total time 25 minutes, over half spent in counseling and coordination of care.   Nelida Meuse III

## 2017-01-15 ENCOUNTER — Emergency Department (HOSPITAL_COMMUNITY)
Admission: EM | Admit: 2017-01-15 | Discharge: 2017-01-16 | Disposition: A | Discharge status: Home / Self Care | Payer: Medicare Other | Attending: Emergency Medicine | Admitting: Emergency Medicine

## 2017-01-15 ENCOUNTER — Encounter (HOSPITAL_COMMUNITY): Payer: Self-pay

## 2017-01-15 DIAGNOSIS — F17210 Nicotine dependence, cigarettes, uncomplicated: Secondary | ICD-10-CM | POA: Insufficient documentation

## 2017-01-15 DIAGNOSIS — I120 Hypertensive chronic kidney disease with stage 5 chronic kidney disease or end stage renal disease: Secondary | ICD-10-CM | POA: Insufficient documentation

## 2017-01-15 DIAGNOSIS — E213 Hyperparathyroidism, unspecified: Secondary | ICD-10-CM | POA: Insufficient documentation

## 2017-01-15 DIAGNOSIS — N186 End stage renal disease: Secondary | ICD-10-CM | POA: Diagnosis not present

## 2017-01-15 DIAGNOSIS — Z79899 Other long term (current) drug therapy: Secondary | ICD-10-CM | POA: Diagnosis not present

## 2017-01-15 DIAGNOSIS — R1013 Epigastric pain: Secondary | ICD-10-CM | POA: Insufficient documentation

## 2017-01-15 DIAGNOSIS — R112 Nausea with vomiting, unspecified: Secondary | ICD-10-CM | POA: Insufficient documentation

## 2017-01-15 DIAGNOSIS — Z992 Dependence on renal dialysis: Secondary | ICD-10-CM | POA: Insufficient documentation

## 2017-01-15 DIAGNOSIS — R109 Unspecified abdominal pain: Secondary | ICD-10-CM | POA: Diagnosis present

## 2017-01-15 LAB — COMPREHENSIVE METABOLIC PANEL
ALK PHOS: 66 U/L (ref 38–126)
ALT: 22 U/L (ref 17–63)
ANION GAP: 15 (ref 5–15)
AST: 19 U/L (ref 15–41)
Albumin: 4.4 g/dL (ref 3.5–5.0)
BILIRUBIN TOTAL: 1 mg/dL (ref 0.3–1.2)
BUN: 9 mg/dL (ref 6–20)
CALCIUM: 9.1 mg/dL (ref 8.9–10.3)
CO2: 28 mmol/L (ref 22–32)
CREATININE: 5.71 mg/dL — AB (ref 0.61–1.24)
Chloride: 90 mmol/L — ABNORMAL LOW (ref 101–111)
GFR calc Af Amer: 14 mL/min — ABNORMAL LOW (ref >60)
GFR calc non Af Amer: 12 mL/min — ABNORMAL LOW (ref >60)
Glucose, Bld: 95 mg/dL (ref 65–99)
Potassium: 2.9 mmol/L — ABNORMAL LOW (ref 3.5–5.1)
SODIUM: 133 mmol/L — AB (ref 135–145)
TOTAL PROTEIN: 8.7 g/dL — AB (ref 6.5–8.1)

## 2017-01-15 LAB — CBC
HCT: 41.1 % (ref 39.0–52.0)
HEMOGLOBIN: 14.1 g/dL (ref 13.0–17.0)
MCH: 31.5 pg (ref 26.0–34.0)
MCHC: 34.3 g/dL (ref 30.0–36.0)
MCV: 91.7 fL (ref 78.0–100.0)
PLATELETS: 225 10*3/uL (ref 150–400)
RBC: 4.48 MIL/uL (ref 4.22–5.81)
RDW: 13.8 % (ref 11.5–15.5)
WBC: 11 10*3/uL — AB (ref 4.0–10.5)

## 2017-01-15 LAB — LIPASE, BLOOD: Lipase: 56 U/L — ABNORMAL HIGH (ref 11–51)

## 2017-01-15 MED ORDER — MORPHINE SULFATE (PF) 4 MG/ML IV SOLN
4.0000 mg | Freq: Once | INTRAVENOUS | Status: AC
  Administered 2017-01-16: 4 mg via INTRAVENOUS
  Filled 2017-01-15: qty 1

## 2017-01-15 MED ORDER — ONDANSETRON HCL 4 MG/2ML IJ SOLN
4.0000 mg | Freq: Once | INTRAMUSCULAR | Status: AC
  Administered 2017-01-16: 4 mg via INTRAVENOUS
  Filled 2017-01-15: qty 2

## 2017-01-15 MED ORDER — GI COCKTAIL ~~LOC~~
30.0000 mL | Freq: Once | ORAL | Status: AC
  Administered 2017-01-16: 30 mL via ORAL
  Filled 2017-01-15: qty 30

## 2017-01-15 MED ORDER — DICYCLOMINE HCL 10 MG PO CAPS
10.0000 mg | ORAL_CAPSULE | Freq: Once | ORAL | Status: AC
  Administered 2017-01-16: 10 mg via ORAL
  Filled 2017-01-15: qty 1

## 2017-01-15 NOTE — ED Provider Notes (Signed)
[facility] DEPT Provider Note   CSN: [id number] Arrival date & time: 01/15/17  1433     History   Chief Complaint Chief Complaint  Patient presents with  . Abdominal Pain    HPI [name] is a 31 y.o. male.  Patient with PMH of ESRD on HD (MWF) x 14 years, presents to the ED with chief complaint of abdominal pain.  He states that this feels similar to his gastroparesis.  He reports nausea, vomiting, and abdominal pain since Wednesday of this week.  He was dialyzed today, but began vomiting and was sent to the ER.  He denies any fevers or chills.  He denies any blood in his vomit.  He states that he has not had a BM since yesterday.  He states that he normally takes norco and bentyl for his symptoms, which helped a little, but did not alleviate his symptoms completely.  He denies any other associated symptoms.   The history is provided by the patient. No language interpreter was used.    Past Medical History:  Diagnosis Date  . Arthritis    "all over" (11/19/2016)  . Chronic lower back pain   . Complication of anesthesia    A little while to wake up after knee surgery in 2008  . Dizziness    when coming off of dialysis  . ESRD (end stage renal disease) on dialysis [facility])    MWF and goes to [facility] (11/19/2016)  . Family history of anesthesia complication    mom slow to wake up  . GERD (gastroesophageal reflux disease)    takes Omeprazole as needed  . TLXBWIOM(355.9)    "monthly" (11/19/2016)  . History of hiatal hernia   . Hyperparathyroidism ([place])   . Hypertension   . Joint pain   . Joint swelling   . Morbid obesity ([place])   . PONV (postoperative nausea and vomiting)   . Renal insufficiency    Pt is on dyalisis and has been for 14 years    Patient Active Problem List   Diagnosis Date Noted  . Increased anion gap metabolic acidosis 74/16/3845  . Intractable abdominal pain   . Common bile duct dilatation   . Cholelithiasis without cholecystitis   .  Intractable nausea and vomiting 10/08/2016  . Hematemesis 08/14/2016  . GERD (gastroesophageal reflux disease) 08/14/2016  . Essential hypertension 08/14/2016  . GIB (gastrointestinal bleeding) 08/14/2016  . SIRS (systemic inflammatory response syndrome) ([place]) 08/14/2016  . CKD (chronic kidney disease) stage V requiring chronic dialysis ([place]) 06/11/2016  . Non-intractable vomiting with nausea 06/11/2016  . Hepatic steatosis 06/11/2016  . Cholelithiasis 06/11/2016  . Localized osteoarthritis of left knee 07/11/2015  . Abdominal pain, epigastric 12/05/2014  . Lapband April 2016 11/27/2014  . Arthritis of knee 03/27/2014  . Morbid obesity, weight 291, BMI - 47 02/15/2014  . Hyperparathyroidism, secondary ([place]) 03/14/2013    Past Surgical History:  Procedure Laterality Date  . AV FISTULA PLACEMENT Bilateral 2005,2007   "right; left"  . AV FISTULA REPAIR Right 2007   "tried to clean it out; couldn't do it"  . BREATH TEK H PYLORI N/A 05/15/2014   Procedure: BREATH TEK H PYLORI;  Surgeon: Alphonsa Overall, MD;  Location: [facility];  Service: General;  Laterality: N/A;  . CHOLECYSTECTOMY N/A 11/23/2016   Procedure: LAPAROSCOPIC CHOLECYSTECTOMY WITH INTRAOPERATIVE CHOLANGIOGRAM;  Surgeon: Erroll Luna, MD;  Location: [facility];  Service: General;  Laterality: N/A;  . ESOPHAGOGASTRODUODENOSCOPY N/A 12/07/2014   Procedure: ESOPHAGOGASTRODUODENOSCOPY (EGD);  Surgeon: Alphonsa Overall, MD;  Location: [facility] ENDOSCOPY;  Service: General;  Laterality: N/A;  . ESOPHAGOGASTRODUODENOSCOPY N/A 12/17/2014   Procedure: ESOPHAGOGASTRODUODENOSCOPY (EGD);  Surgeon: Alphonsa Overall, MD;  Location: [facility];  Service: General;  Laterality: N/A;  . ESOPHAGOGASTRODUODENOSCOPY N/A 08/16/2016   Procedure: ESOPHAGOGASTRODUODENOSCOPY (EGD);  Surgeon: Jerene Bears, MD;  Location: [facility];  Service: Gastroenterology;  Laterality: N/A;  . HERNIA REPAIR  11/2014   w/lap band OR  . JOINT REPLACEMENT    . KNEE ARTHROSCOPY Left 2007    . LAPAROSCOPIC GASTRIC BANDING N/A 11/12/2014   Procedure: LAPAROSCOPIC GASTRIC BANDING;  Surgeon: Alphonsa Overall, MD;  Location: [facility];  Service: General;  Laterality: N/A;  . PARATHYROIDECTOMY N/A 03/30/2013   Procedure: TOTAL PARATHYROIDECTOMY WITH AUTOTRANSPLANT;  Surgeon: Earnstine Regal, MD;  Location: [facility];  Service: General;  Laterality: N/A;  Autotransplant to left lower arm.  Marland Kitchen TOTAL KNEE ARTHROPLASTY Right 03/27/2014   Procedure: UNICOMPARTMENTAL ARTHROPLASTY;  Surgeon: Meredith Pel, MD;  Location: [facility];  Service: Orthopedics;  Laterality: Right;       Home Medications    Prior to Admission medications   Medication Sig Start Date End Date Taking? Authorizing Provider  albuterol (PROVENTIL HFA;VENTOLIN HFA) 108 (90 Base) MCG/ACT inhaler Inhale 2 puffs into the lungs every 4 (four) hours as needed for wheezing or shortness of breath. 05/14/16   Lysbeth Penner, [name]  calcium carbonate (TUMS - DOSED IN MG ELEMENTAL CALCIUM) 500 MG chewable tablet Chew 2 tablets by mouth 2 (two) times daily as needed for indigestion or heartburn.     [provider]  dicyclomine (BENTYL) 10 MG capsule Take 10 mg by mouth 3 (three) times daily as needed.  01/05/17   [provider]  famotidine (PEPCID) 20 MG tablet Take 1 tablet (20 mg total) by mouth daily. 01/02/17 01/09/17  Duffy Bruce, MD  HYDROcodone-acetaminophen (NORCO/VICODIN) 5-325 MG tablet Take 1-2 tablets by mouth every 6 (six) hours as needed for severe pain. 12/05/16   Law, Bea Graff, [name]  methocarbamol (ROBAXIN) 500 MG tablet Take 1 tablet (500 mg total) by mouth every 8 (eight) hours as needed for muscle spasms. 06/15/16   Theodis Blaze, MD  metoCLOPramide (REGLAN) 5 MG tablet Take 1 tablet (5 mg total) by mouth every 8 (eight) hours as needed for nausea or vomiting. 01/02/17   Duffy Bruce, MD  multivitamin (RENA-VIT) TABS tablet Take 1 tablet by mouth daily.    [provider]  omeprazole (PRILOSEC) 20  MG capsule Take 1 capsule (20 mg total) by mouth daily. 07/27/16   Rancour, Annie Main, MD  ondansetron (ZOFRAN-ODT) 8 MG disintegrating tablet  01/05/17   [provider]  prochlorperazine (COMPAZINE) 25 MG suppository Place 1 suppository (25 mg total) rectally every 12 (twelve) hours as needed for nausea or vomiting. 01/01/17   Esterwood, Amy S, [name]  promethazine (PHENERGAN) 25 MG suppository Place 1 suppository (25 mg total) rectally every 8 (eight) hours as needed for refractory nausea / vomiting. 01/02/17   Duffy Bruce, MD  sevelamer carbonate (RENVELA) 2.4 G PACK Take 2.4 g by mouth 3 (three) times daily with meals.     [provider]  traMADol (ULTRAM) 50 MG tablet Take 1 tablet (50 mg total) by mouth every 8 (eight) hours as needed. Patient taking differently: Take 50 mg by mouth every 6 (six) hours as needed (for pain).  06/15/16   Theodis Blaze, MD    Family History Family History  Problem  Relation Age of Onset  . Hypertension Mother   . Diabetes Father   . Kidney Stones Sister   . Diabetes Maternal Grandmother   . Liver cancer Maternal Uncle     Social History Social History  Substance Use Topics  . Smoking status: Current Every Day Smoker    Packs/day: 0.10    Years: 16.00    Types: Cigarettes    Last attempt to quit: 12/17/2014  . Smokeless tobacco: Never Used  . Alcohol use No     Allergies   Dilaudid [hydromorphone hcl]   Review of Systems Review of Systems  All other systems reviewed and are negative.    Physical Exam Updated Vital Signs BP 115/65   Pulse 70   Temp 98.3 F (36.8 C) (Oral)   Resp 20   Ht 5\' 6"  (1.676 m)   Wt 136.1 kg (300 lb)   SpO2 100%   BMI 48.42 kg/m   Physical Exam  Constitutional: He is oriented to person, place, and time. He appears well-developed and well-nourished.  HENT:  Head: Normocephalic and atraumatic.  Eyes: Conjunctivae and EOM are normal. Pupils are equal, round, and reactive to light. Right  eye exhibits no discharge. Left eye exhibits no discharge. No scleral icterus.  Neck: Normal range of motion. Neck supple. No JVD present.  Cardiovascular: Normal rate, regular rhythm and normal heart sounds.  Exam reveals no gallop and no friction rub.   No murmur heard. Pulmonary/Chest: Effort normal and breath sounds normal. No respiratory distress. He has no wheezes. He has no rales. He exhibits no tenderness.  Abdominal: Soft. He exhibits no distension and no mass. There is tenderness. There is no rebound and no guarding.  Epigastric abdominal tenderness  Musculoskeletal: Normal range of motion. He exhibits no edema or tenderness.  Neurological: He is alert and oriented to person, place, and time.  Skin: Skin is warm and dry.  Psychiatric: He has a normal mood and affect. His behavior is normal. Judgment and thought content normal.  Nursing note and vitals reviewed.    ED Treatments / Results  Labs (all labs ordered are listed, but only abnormal results are displayed) Labs Reviewed  LIPASE, BLOOD - Abnormal; Notable for the following:       Result Value   Lipase 56 (*)    All other components within normal limits  COMPREHENSIVE METABOLIC PANEL - Abnormal; Notable for the following:    Sodium 133 (*)    Potassium 2.9 (*)    Chloride 90 (*)    Creatinine, Ser 5.71 (*)    Total Protein 8.7 (*)    GFR calc non Af Amer 12 (*)    GFR calc Af Amer 14 (*)    All other components within normal limits  CBC - Abnormal; Notable for the following:    WBC 11.0 (*)    All other components within normal limits  URINALYSIS, ROUTINE W REFLEX MICROSCOPIC  RAPID URINE DRUG SCREEN, HOSP PERFORMED  I-STAT CG4 LACTIC ACID, ED    EKG  EKG Interpretation None       Radiology No results found.  Procedures Procedures (including critical care time)  Medications Ordered in ED Medications  morphine 4 MG/ML injection 4 mg (not administered)  ondansetron (ZOFRAN) injection 4 mg (not  administered)  dicyclomine (BENTYL) capsule 10 mg (not administered)  gi cocktail (Maalox,Lidocaine,Donnatal) (not administered)     Initial Impression / Assessment and Plan / ED Course  I have reviewed the triage vital  signs and the nursing notes.  Pertinent labs & imaging results that were available during my care of the patient were reviewed by me and considered in my medical decision making (see chart for details).     Patient with hx of gastroparesis with n/v and abdominal pain since Wednesday.  He denies fever.  He got HD today.  Will treat his pain and reassess.  I have a low suspicion for emergent intraabdominal pathology given patient's multiple visits for the same with multiple unremarkable CT scans.  Lactate is 1.31. His lipase is mildly elevated. He was dialyzed today.  He is feeling improved.  He is tolerating oral intake.    Will discharge to home.  Patient instructed to return for:  New or worsening symptoms, including, increased abdominal pain, especially pain that localizes to one side, bloody vomit, bloody diarrhea, fever >101, and intractable vomiting.   Final Clinical Impressions(s) / ED Diagnoses   Final diagnoses:  Epigastric pain    New Prescriptions Discharge Medication List as of 01/16/2017  4:29 AM    START taking these medications   Details  dicyclomine (BENTYL) 20 MG tablet Take 1 tablet (20 mg total) by mouth 2 (two) times daily., Starting Sat 01/16/2017, Print    ondansetron (ZOFRAN) 4 MG tablet Take 1 tablet (4 mg total) by mouth every 6 (six) hours., Starting Sat 01/16/2017, Print         Montine Circle, [name] 01/16/17 0542    [name], MD 01/16/17 [phone]

## 2017-01-15 NOTE — ED Notes (Signed)
Pt stated that he does not make urine anymore when asked about providing a sample.

## 2017-01-15 NOTE — ED Notes (Signed)
Pt currently in Triage across from Triage 1.

## 2017-01-15 NOTE — ED Notes (Signed)
Attempted PIV x 2; access not established. 2nd RN to attempt.

## 2017-01-15 NOTE — ED Triage Notes (Signed)
Pt arrives via [facility] and sent to triage upon arrival. Pt is from dialysis but reports nausea, vomiting and abdominal pain since Wednesday. Pt had about 45 minutes left of dialysis, goes MWF. A&OX4. BP-109/61, HR-80. Pt arrives still accessed from dialysis, left arm.

## 2017-01-15 NOTE — ED Notes (Signed)
RN called to pt side. Pt informed this RN that he still has dialysis needle in arm. RN notified IV team for removal.

## 2017-01-15 NOTE — ED Notes (Signed)
ED Provider at bedside. 

## 2017-01-16 ENCOUNTER — Emergency Department (HOSPITAL_COMMUNITY): Payer: Medicare Other

## 2017-01-16 DIAGNOSIS — R1013 Epigastric pain: Secondary | ICD-10-CM | POA: Diagnosis not present

## 2017-01-16 LAB — I-STAT CG4 LACTIC ACID, ED: LACTIC ACID, VENOUS: 1.31 mmol/L (ref 0.5–1.9)

## 2017-01-16 MED ORDER — ONDANSETRON HCL 4 MG PO TABS: 4.0000 mg | ORAL_TABLET | Freq: Four times a day (QID) | ORAL | 0 refills | Status: DC

## 2017-01-16 MED ORDER — DICYCLOMINE HCL 20 MG PO TABS: 20.0000 mg | ORAL_TABLET | Freq: Two times a day (BID) | ORAL | 0 refills | Status: DC

## 2017-01-16 MED ORDER — MORPHINE SULFATE (PF) 4 MG/ML IV SOLN
4.0000 mg | Freq: Once | INTRAVENOUS | Status: AC
  Administered 2017-01-16: 4 mg via INTRAVENOUS
  Filled 2017-01-16: qty 1

## 2017-01-16 MED ORDER — HYDROCODONE-ACETAMINOPHEN 5-325 MG PO TABS: 1.0000 | ORAL_TABLET | Freq: Four times a day (QID) | ORAL | 0 refills | Status: DC | PRN

## 2017-01-16 NOTE — ED Notes (Signed)
Still unable to get blood for lactic acid level.  Phlebotomy made aware.

## 2017-01-16 NOTE — ED Notes (Addendum)
Patient reports taking two sips of water and chest started hurting again.  Provider made aware.

## 2017-01-16 NOTE — ED Notes (Signed)
Given water to try PO challenge.  Encouraged to call for assistance as needed.

## 2017-02-09 ENCOUNTER — Emergency Department (HOSPITAL_COMMUNITY)
Admission: EM | Admit: 2017-02-09 | Discharge: 2017-02-09 | Disposition: A | Discharge status: Home / Self Care | Payer: Medicare Other | Attending: Emergency Medicine | Admitting: Emergency Medicine

## 2017-02-09 ENCOUNTER — Telehealth: Payer: Self-pay | Admitting: Gastroenterology

## 2017-02-09 ENCOUNTER — Encounter (HOSPITAL_COMMUNITY): Payer: Self-pay | Admitting: Emergency Medicine

## 2017-02-09 DIAGNOSIS — Z885 Allergy status to narcotic agent status: Secondary | ICD-10-CM | POA: Insufficient documentation

## 2017-02-09 DIAGNOSIS — Z992 Dependence on renal dialysis: Secondary | ICD-10-CM

## 2017-02-09 DIAGNOSIS — R112 Nausea with vomiting, unspecified: Secondary | ICD-10-CM

## 2017-02-09 DIAGNOSIS — F17210 Nicotine dependence, cigarettes, uncomplicated: Secondary | ICD-10-CM | POA: Diagnosis not present

## 2017-02-09 DIAGNOSIS — R109 Unspecified abdominal pain: Secondary | ICD-10-CM | POA: Insufficient documentation

## 2017-02-09 DIAGNOSIS — G8929 Other chronic pain: Secondary | ICD-10-CM

## 2017-02-09 DIAGNOSIS — E211 Secondary hyperparathyroidism, not elsewhere classified: Secondary | ICD-10-CM | POA: Insufficient documentation

## 2017-02-09 DIAGNOSIS — I120 Hypertensive chronic kidney disease with stage 5 chronic kidney disease or end stage renal disease: Secondary | ICD-10-CM | POA: Diagnosis not present

## 2017-02-09 DIAGNOSIS — Z79899 Other long term (current) drug therapy: Secondary | ICD-10-CM | POA: Insufficient documentation

## 2017-02-09 DIAGNOSIS — N186 End stage renal disease: Secondary | ICD-10-CM | POA: Diagnosis not present

## 2017-02-09 LAB — COMPREHENSIVE METABOLIC PANEL
ALBUMIN: 4.4 g/dL (ref 3.5–5.0)
ALT: 16 U/L — AB (ref 17–63)
ANION GAP: 23 — AB (ref 5–15)
AST: 19 U/L (ref 15–41)
Alkaline Phosphatase: 68 U/L (ref 38–126)
BUN: 28 mg/dL — ABNORMAL HIGH (ref 6–20)
CHLORIDE: 92 mmol/L — AB (ref 101–111)
CO2: 22 mmol/L (ref 22–32)
CREATININE: 10.82 mg/dL — AB (ref 0.61–1.24)
Calcium: 8.1 mg/dL — ABNORMAL LOW (ref 8.9–10.3)
GFR calc non Af Amer: 6 mL/min — ABNORMAL LOW (ref >60)
GFR, EST AFRICAN AMERICAN: 6 mL/min — AB (ref >60)
Glucose, Bld: 106 mg/dL — ABNORMAL HIGH (ref 65–99)
Potassium: 3.5 mmol/L (ref 3.5–5.1)
SODIUM: 137 mmol/L (ref 135–145)
Total Bilirubin: 1.1 mg/dL (ref 0.3–1.2)
Total Protein: 8.9 g/dL — ABNORMAL HIGH (ref 6.5–8.1)

## 2017-02-09 LAB — CBC
HCT: 42.7 % (ref 39.0–52.0)
HEMOGLOBIN: 14.8 g/dL (ref 13.0–17.0)
MCH: 31.2 pg (ref 26.0–34.0)
MCHC: 34.7 g/dL (ref 30.0–36.0)
MCV: 90.1 fL (ref 78.0–100.0)
Platelets: 260 10*3/uL (ref 150–400)
RBC: 4.74 MIL/uL (ref 4.22–5.81)
RDW: 13.5 % (ref 11.5–15.5)
WBC: 10.8 10*3/uL — ABNORMAL HIGH (ref 4.0–10.5)

## 2017-02-09 LAB — LIPASE, BLOOD: LIPASE: 29 U/L (ref 11–51)

## 2017-02-09 MED ORDER — MORPHINE SULFATE (PF) 4 MG/ML IV SOLN: 4.0000 mg | Freq: Once | INTRAVENOUS | Status: DC

## 2017-02-09 MED ORDER — PROCHLORPERAZINE 25 MG RE SUPP: 25.0000 mg | Freq: Two times a day (BID) | RECTAL | 0 refills | Status: DC | PRN

## 2017-02-09 MED ORDER — SODIUM CHLORIDE 0.9 % IV BOLUS (SEPSIS)
500.0000 mL | Freq: Once | INTRAVENOUS | Status: AC
  Administered 2017-02-09: 500 mL via INTRAVENOUS

## 2017-02-09 MED ORDER — MORPHINE SULFATE (PF) 4 MG/ML IV SOLN
4.0000 mg | Freq: Once | INTRAVENOUS | Status: AC
  Administered 2017-02-09: 4 mg via INTRAVENOUS
  Filled 2017-02-09: qty 1

## 2017-02-09 MED ORDER — ONDANSETRON HCL 4 MG/2ML IJ SOLN
4.0000 mg | Freq: Once | INTRAMUSCULAR | Status: AC
  Administered 2017-02-09: 4 mg via INTRAVENOUS
  Filled 2017-02-09: qty 2

## 2017-02-09 MED ORDER — PROCHLORPERAZINE EDISYLATE 5 MG/ML IJ SOLN
10.0000 mg | Freq: Once | INTRAMUSCULAR | Status: AC
  Administered 2017-02-09: 10 mg via INTRAVENOUS
  Filled 2017-02-09: qty 2

## 2017-02-09 NOTE — ED Triage Notes (Signed)
Pt. Stated, I got sick yesterday after dialysis with N/V

## 2017-02-09 NOTE — ED Notes (Signed)
Pt requesting medication for nausea. Will notify Dr. Roxanne Mins.

## 2017-02-09 NOTE — ED Notes (Addendum)
Dr. Roxanne Mins given update on pt.

## 2017-02-09 NOTE — ED Notes (Signed)
Pt states he does not make urine.  

## 2017-02-09 NOTE — Telephone Encounter (Signed)
Patient has been vomiting since yesterday after dialysis.  He is not able to keep down any of his zofran and he does not have any phenergan or compazine suppositories.  He sounds as if he is in some mild distress, very uncomfortable for uncontrolled vomiting since yesterday. He is advised that if he does not have any suppositories that he should proceed to the ED.  He will touch base with the dialysis center as well.

## 2017-02-09 NOTE — ED Notes (Signed)
Pt requesting more pain meds. Pt has had no vomiting or dry heaves but states his abd still hurts

## 2017-02-09 NOTE — ED Notes (Signed)
Pt doesn't not urinate daily per pt. Unable to collect urine sample

## 2017-02-09 NOTE — ED Provider Notes (Signed)
[facility] DEPT Provider Note   CSN: [id number] Arrival date & time: 02/09/17  1149     History   Chief Complaint Chief Complaint  Patient presents with  . Abdominal Pain  . Emesis    HPI [name] is a 31 y.o. male.  The history is provided by the patient.  He is an end-stage renal patient on dialysis, with frequent visits for abdominal pain and vomiting. He had dialysis yesterday, and started vomiting following that. He had been using Compazine suppositories, but had run out. He is also complaining of crampy right upper quadrant pain. When present, pain rates at 8/10. Pain subsides briefly after vomiting. He denies fever or chills, but has broken out in sweats.  Past Medical History:  Diagnosis Date  . Arthritis    "all over" (11/19/2016)  . Chronic lower back pain   . Complication of anesthesia    A little while to wake up after knee surgery in 2008  . Dizziness    when coming off of dialysis  . ESRD (end stage renal disease) on dialysis [facility])    MWF and goes to [facility] (11/19/2016)  . Family history of anesthesia complication    mom slow to wake up  . GERD (gastroesophageal reflux disease)    takes Omeprazole as needed  . KGYJEHUD(149.7)    "monthly" (11/19/2016)  . History of hiatal hernia   . Hyperparathyroidism ([place])   . Hypertension   . Joint pain   . Joint swelling   . Morbid obesity ([place])   . PONV (postoperative nausea and vomiting)   . Renal insufficiency    Pt is on dyalisis and has been for 14 years    Patient Active Problem List   Diagnosis Date Noted  . Increased anion gap metabolic acidosis 02/63/7858  . Intractable abdominal pain   . Common bile duct dilatation   . Cholelithiasis without cholecystitis   . Intractable nausea and vomiting 10/08/2016  . Hematemesis 08/14/2016  . GERD (gastroesophageal reflux disease) 08/14/2016  . Essential hypertension 08/14/2016  . GIB (gastrointestinal bleeding) 08/14/2016  . SIRS (systemic  inflammatory response syndrome) ([place]) 08/14/2016  . CKD (chronic kidney disease) stage V requiring chronic dialysis ([place]) 06/11/2016  . Non-intractable vomiting with nausea 06/11/2016  . Hepatic steatosis 06/11/2016  . Cholelithiasis 06/11/2016  . Localized osteoarthritis of left knee 07/11/2015  . Abdominal pain, epigastric 12/05/2014  . Lapband April 2016 11/27/2014  . Arthritis of knee 03/27/2014  . Morbid obesity, weight 291, BMI - 47 02/15/2014  . Hyperparathyroidism, secondary ([place]) 03/14/2013    Past Surgical History:  Procedure Laterality Date  . AV FISTULA PLACEMENT Bilateral 2005,2007   "right; left"  . AV FISTULA REPAIR Right 2007   "tried to clean it out; couldn't do it"  . BREATH TEK H PYLORI N/A 05/15/2014   Procedure: BREATH TEK H PYLORI;  Surgeon: Alphonsa Overall, MD;  Location: [facility];  Service: General;  Laterality: N/A;  . CHOLECYSTECTOMY N/A 11/23/2016   Procedure: LAPAROSCOPIC CHOLECYSTECTOMY WITH INTRAOPERATIVE CHOLANGIOGRAM;  Surgeon: Erroll Luna, MD;  Location: [facility];  Service: General;  Laterality: N/A;  . ESOPHAGOGASTRODUODENOSCOPY N/A 12/07/2014   Procedure: ESOPHAGOGASTRODUODENOSCOPY (EGD);  Surgeon: Alphonsa Overall, MD;  Location[facility] END[facility]COPY;  Service: General;  Laterality: N/A;  . ESOPHAGOGASTRODUODENOSCOPY N/A 12/17/2014   Procedure: ESOPHAGOGASTRODUODENOSCOPY (EGD);  Surgeon: Alphonsa Overall, MD;  Location: [facility];  Service: General;  Laterality: N/A;  . ESOPHAGOGASTRODUODENOSCOPY N/A 08/16/2016   Procedure: ESOPHAGOGASTRODUODENOSCOPY (EGD);  Surgeon: Lajuan Lines  Pyrtle, MD;  Location: [facility];  Service: Gastroenterology;  Laterality: N/A;  . HERNIA REPAIR  11/2014   w/lap band OR  . JOINT REPLACEMENT    . KNEE ARTHROSCOPY Left 2007  . LAPAROSCOPIC GASTRIC BANDING N/A 11/12/2014   Procedure: LAPAROSCOPIC GASTRIC BANDING;  Surgeon: Alphonsa Overall, MD;  Location: [facility];  Service: General;  Laterality: N/A;  . PARATHYROIDECTOMY N/A 03/30/2013   Procedure:  TOTAL PARATHYROIDECTOMY WITH AUTOTRANSPLANT;  Surgeon: Earnstine Regal, MD;  Location: [facility];  Service: General;  Laterality: N/A;  Autotransplant to left lower arm.  Marland Kitchen TOTAL KNEE ARTHROPLASTY Right 03/27/2014   Procedure: UNICOMPARTMENTAL ARTHROPLASTY;  Surgeon: Meredith Pel, MD;  Location: [facility] [place];  Service: Orthopedics;  Laterality: Right;       Home Medications    Prior to Admission medications   Medication Sig Start Date End Date Taking? Authorizing Provider  albuterol (PROVENTIL HFA;VENTOLIN HFA) 108 (90 Base) MCG/ACT inhaler Inhale 2 puffs into the lungs every 4 (four) hours as needed for wheezing or shortness of breath. 05/14/16   Lysbeth Penner, [name]  calcium carbonate (TUMS - DOSED IN MG ELEMENTAL CALCIUM) 500 MG chewable tablet Chew 2 tablets by mouth 2 (two) times daily as needed for indigestion or heartburn.     [provider]  dicyclomine (BENTYL) 20 MG tablet Take 1 tablet (20 mg total) by mouth 2 (two) times daily. 01/16/17   Montine Circle, [name]  famotidine (PEPCID) 20 MG tablet Take 1 tablet (20 mg total) by mouth daily. 01/02/17 01/16/17  Duffy Bruce, MD  HYDROcodone-acetaminophen (NORCO/VICODIN) 5-325 MG tablet Take 1-2 tablets by mouth every 6 (six) hours as needed. 01/16/17   Montine Circle, [name]  methocarbamol (ROBAXIN) 500 MG tablet Take 1 tablet (500 mg total) by mouth every 8 (eight) hours as needed for muscle spasms. Patient not taking: Reported on 01/16/2017 06/15/16   Theodis Blaze, MD  metoCLOPramide (REGLAN) 5 MG tablet Take 1 tablet (5 mg total) by mouth every 8 (eight) hours as needed for nausea or vomiting. 01/02/17   Duffy Bruce, MD  multivitamin (RENA-VIT) TABS tablet Take 1 tablet by mouth daily.    [provider]  omeprazole (PRILOSEC) 20 MG capsule Take 1 capsule (20 mg total) by mouth daily. 07/27/16   Rancour, Annie Main, MD  ondansetron (ZOFRAN) 4 MG tablet Take 1 tablet (4 mg total) by mouth every 6 (six) hours. 01/16/17    Montine Circle, [name]  prochlorperazine (COMPAZINE) 25 MG suppository Place 1 suppository (25 mg total) rectally every 12 (twelve) hours as needed for nausea or vomiting. 01/01/17   Esterwood, Amy S, [name]  promethazine (PHENERGAN) 25 MG suppository Place 1 suppository (25 mg total) rectally every 8 (eight) hours as needed for refractory nausea / vomiting. 01/02/17   Duffy Bruce, MD  sevelamer carbonate (RENVELA) 2.4 G PACK Take 2.4 g by mouth 3 (three) times daily with meals.     [provider]  traMADol (ULTRAM) 50 MG tablet Take 1 tablet (50 mg total) by mouth every 8 (eight) hours as needed. Patient taking differently: Take 50 mg by mouth every 6 (six) hours as needed (for pain).  06/15/16   Theodis Blaze, MD    Family History Family History  Problem Relation Age of Onset  . Hypertension Mother   . Diabetes Father   . Kidney Stones Sister   . Diabetes Maternal Grandmother   . Liver cancer Maternal Uncle     Social History Social History  Substance  Use Topics  . Smoking status: Current Every Day Smoker    Packs/day: 0.10    Years: 16.00    Types: Cigarettes    Last attempt to quit: 12/17/2014  . Smokeless tobacco: Never Used  . Alcohol use No     Allergies   Dilaudid [hydromorphone hcl]   Review of Systems Review of Systems  All other systems reviewed and are negative.    Physical Exam Updated Vital Signs BP 98/64 (BP Location: Right Arm)   Pulse 91   Temp 99.1 F (37.3 C) (Oral)   Resp 16   Ht 5\' 6"  (1.676 m)   Wt (!) 141.6 kg (312 lb 2.7 oz)   SpO2 100%   BMI 50.39 kg/m   Physical Exam  Nursing note and vitals reviewed.  Morbidly obese 31 year old male, resting comfortably and in no acute distress. Vital signs are normal. Oxygen saturation is 100%, which is normal. Head is normocephalic and atraumatic. PERRLA, EOMI. Oropharynx is clear. Neck is nontender and supple without adenopathy or JVD. Back is nontender and there is no CVA  tenderness. Lungs are clear without rales, wheezes, or rhonchi. Chest is nontender. Heart has regular rate and rhythm without murmur. Abdomen is soft, flat, with moderate right upper quadrant tenderness. There is no rebound or guarding. There are no masses or hepatosplenomegaly and peristalsis is hypoactive. Extremities have  1+ edema, full range of motion is present. AV fistula is present in the left forearm with thrill present. Skin is warm and dry without rash. Neurologic: Mental status is normal, cranial nerves are intact, there are no motor or sensory deficits.  ED Treatments / Results  Labs (all labs ordered are listed, but only abnormal results are displayed) Labs Reviewed  COMPREHENSIVE METABOLIC PANEL - Abnormal; Notable for the following:       Result Value   Chloride 92 (*)    Glucose, Bld 106 (*)    BUN 28 (*)    Creatinine, Ser 10.82 (*)    Calcium 8.1 (*)    Total Protein 8.9 (*)    ALT 16 (*)    GFR calc non Af Amer 6 (*)    GFR calc Af Amer 6 (*)    Anion gap 23 (*)    All other components within normal limits  CBC - Abnormal; Notable for the following:    WBC 10.8 (*)    All other components within normal limits  LIPASE, BLOOD  URINALYSIS, ROUTINE W REFLEX MICROSCOPIC    Procedures Procedures (including critical care time)  Medications Ordered in ED Medications  morphine 4 MG/ML injection 4 mg (0 mg Intravenous Hold 02/09/17 2048)  morphine 4 MG/ML injection 4 mg (4 mg Intravenous Given 02/09/17 1532)  ondansetron (ZOFRAN) injection 4 mg (4 mg Intravenous Given 02/09/17 1532)  sodium chloride 0.9 % bolus 500 mL (0 mLs Intravenous Stopped 02/09/17 1951)  morphine 4 MG/ML injection 4 mg (4 mg Intravenous Given 02/09/17 1737)  prochlorperazine (COMPAZINE) injection 10 mg (10 mg Intravenous Given 02/09/17 2016)     Initial Impression / Assessment and Plan / ED Course  I have reviewed the triage vital signs and the nursing notes.  Pertinent labs & imaging  results that were available during my care of the patient were reviewed by me and considered in my medical decision making (see chart for details).  Recurrent abdominal pain, nausea, vomiting. Old records are reviewed, and he has numerous ED visits and hospitalizations for intractable cyclical vomiting. He  is given IV fluids, morphine, ondansetron.  He continued to complain of pain and nausea. He is given additional morphine and was given prochlorperazine which did give him adequate relief of nausea. He continued to complain of abdominal pain, was observed to be sleeping. He had no further vomiting in the ED. It was felt that his symptoms were under adequate control to be discharged. He was given a new prescription for prochlorperazine suppositories.  Final Clinical Impressions(s) / ED Diagnoses   Final diagnoses:  Chronic abdominal pain  Non-intractable vomiting with nausea, unspecified vomiting type  End-stage renal disease on hemodialysis [facility])    New Prescriptions Current Discharge Medication List       Delora Fuel, MD 11/88/67 2341

## 2017-03-02 ENCOUNTER — Ambulatory Visit: Payer: Self-pay | Admitting: Gastroenterology

## 2017-07-04 ENCOUNTER — Encounter (HOSPITAL_COMMUNITY): Payer: Self-pay | Admitting: Emergency Medicine

## 2017-07-04 ENCOUNTER — Other Ambulatory Visit: Payer: Self-pay

## 2017-07-04 ENCOUNTER — Inpatient Hospital Stay (HOSPITAL_COMMUNITY): Payer: Medicare Other

## 2017-07-04 ENCOUNTER — Inpatient Hospital Stay (HOSPITAL_COMMUNITY)
Admission: EM | Admit: 2017-07-04 | Discharge: 2017-07-05 | DRG: 391 | Disposition: A | Discharge status: Home / Self Care | Payer: Medicare Other | Attending: Internal Medicine | Admitting: Internal Medicine

## 2017-07-04 DIAGNOSIS — K920 Hematemesis: Secondary | ICD-10-CM | POA: Diagnosis present

## 2017-07-04 DIAGNOSIS — F17210 Nicotine dependence, cigarettes, uncomplicated: Secondary | ICD-10-CM | POA: Diagnosis present

## 2017-07-04 DIAGNOSIS — Z9049 Acquired absence of other specified parts of digestive tract: Secondary | ICD-10-CM

## 2017-07-04 DIAGNOSIS — R112 Nausea with vomiting, unspecified: Secondary | ICD-10-CM | POA: Diagnosis present

## 2017-07-04 DIAGNOSIS — Z6841 Body Mass Index (BMI) 40.0 and over, adult: Secondary | ICD-10-CM | POA: Diagnosis not present

## 2017-07-04 DIAGNOSIS — M545 Low back pain: Secondary | ICD-10-CM | POA: Diagnosis present

## 2017-07-04 DIAGNOSIS — Z992 Dependence on renal dialysis: Secondary | ICD-10-CM

## 2017-07-04 DIAGNOSIS — K760 Fatty (change of) liver, not elsewhere classified: Secondary | ICD-10-CM | POA: Diagnosis present

## 2017-07-04 DIAGNOSIS — N186 End stage renal disease: Secondary | ICD-10-CM | POA: Diagnosis present

## 2017-07-04 DIAGNOSIS — Z8249 Family history of ischemic heart disease and other diseases of the circulatory system: Secondary | ICD-10-CM

## 2017-07-04 DIAGNOSIS — I120 Hypertensive chronic kidney disease with stage 5 chronic kidney disease or end stage renal disease: Secondary | ICD-10-CM | POA: Diagnosis present

## 2017-07-04 DIAGNOSIS — R195 Other fecal abnormalities: Secondary | ICD-10-CM

## 2017-07-04 DIAGNOSIS — E8729 Other acidosis: Secondary | ICD-10-CM

## 2017-07-04 DIAGNOSIS — N2581 Secondary hyperparathyroidism of renal origin: Secondary | ICD-10-CM | POA: Diagnosis present

## 2017-07-04 DIAGNOSIS — G8929 Other chronic pain: Secondary | ICD-10-CM | POA: Diagnosis present

## 2017-07-04 DIAGNOSIS — K2990 Gastroduodenitis, unspecified, without bleeding: Secondary | ICD-10-CM | POA: Diagnosis present

## 2017-07-04 DIAGNOSIS — Z885 Allergy status to narcotic agent status: Secondary | ICD-10-CM

## 2017-07-04 DIAGNOSIS — Z833 Family history of diabetes mellitus: Secondary | ICD-10-CM

## 2017-07-04 DIAGNOSIS — Z96651 Presence of right artificial knee joint: Secondary | ICD-10-CM | POA: Diagnosis present

## 2017-07-04 DIAGNOSIS — R1084 Generalized abdominal pain: Secondary | ICD-10-CM | POA: Diagnosis not present

## 2017-07-04 DIAGNOSIS — R1013 Epigastric pain: Secondary | ICD-10-CM | POA: Diagnosis not present

## 2017-07-04 DIAGNOSIS — K2970 Gastritis, unspecified, without bleeding: Secondary | ICD-10-CM

## 2017-07-04 DIAGNOSIS — K219 Gastro-esophageal reflux disease without esophagitis: Secondary | ICD-10-CM | POA: Diagnosis present

## 2017-07-04 DIAGNOSIS — M1990 Unspecified osteoarthritis, unspecified site: Secondary | ICD-10-CM | POA: Diagnosis present

## 2017-07-04 DIAGNOSIS — I10 Essential (primary) hypertension: Secondary | ICD-10-CM | POA: Diagnosis not present

## 2017-07-04 DIAGNOSIS — K3184 Gastroparesis: Secondary | ICD-10-CM | POA: Diagnosis present

## 2017-07-04 DIAGNOSIS — E872 Acidosis: Secondary | ICD-10-CM | POA: Diagnosis present

## 2017-07-04 DIAGNOSIS — R109 Unspecified abdominal pain: Secondary | ICD-10-CM | POA: Diagnosis present

## 2017-07-04 DIAGNOSIS — Z800 Family history of malignant neoplasm of digestive organs: Secondary | ICD-10-CM

## 2017-07-04 LAB — COMPREHENSIVE METABOLIC PANEL
ALBUMIN: 4.4 g/dL (ref 3.5–5.0)
ALT: 20 U/L (ref 17–63)
AST: 21 U/L (ref 15–41)
Alkaline Phosphatase: 82 U/L (ref 38–126)
Anion gap: 23 — ABNORMAL HIGH (ref 5–15)
BILIRUBIN TOTAL: 1.1 mg/dL (ref 0.3–1.2)
BUN: 44 mg/dL — AB (ref 6–20)
CHLORIDE: 90 mmol/L — AB (ref 101–111)
CO2: 21 mmol/L — AB (ref 22–32)
Calcium: 8.1 mg/dL — ABNORMAL LOW (ref 8.9–10.3)
Creatinine, Ser: 12.82 mg/dL — ABNORMAL HIGH (ref 0.61–1.24)
GFR calc Af Amer: 5 mL/min — ABNORMAL LOW (ref >60)
GFR calc non Af Amer: 4 mL/min — ABNORMAL LOW (ref >60)
GLUCOSE: 125 mg/dL — AB (ref 65–99)
POTASSIUM: 3.5 mmol/L (ref 3.5–5.1)
Sodium: 134 mmol/L — ABNORMAL LOW (ref 135–145)
TOTAL PROTEIN: 9.1 g/dL — AB (ref 6.5–8.1)

## 2017-07-04 LAB — CBC
HEMATOCRIT: 40.3 % (ref 39.0–52.0)
Hemoglobin: 13.9 g/dL (ref 13.0–17.0)
MCH: 29.3 pg (ref 26.0–34.0)
MCHC: 34.5 g/dL (ref 30.0–36.0)
MCV: 85 fL (ref 78.0–100.0)
Platelets: 212 10*3/uL (ref 150–400)
RBC: 4.74 MIL/uL (ref 4.22–5.81)
RDW: 14.9 % (ref 11.5–15.5)
WBC: 12.1 10*3/uL — ABNORMAL HIGH (ref 4.0–10.5)

## 2017-07-04 LAB — LIPASE, BLOOD: Lipase: 28 U/L (ref 11–51)

## 2017-07-04 LAB — POC OCCULT BLOOD, ED: Fecal Occult Bld: POSITIVE — AB

## 2017-07-04 MED ORDER — SODIUM CHLORIDE 0.9% FLUSH: 3.0000 mL | INTRAVENOUS | Status: DC | PRN

## 2017-07-04 MED ORDER — SODIUM CHLORIDE 0.9% FLUSH
3.0000 mL | Freq: Two times a day (BID) | INTRAVENOUS | Status: DC
  Administered 2017-07-04: 3 mL via INTRAVENOUS

## 2017-07-04 MED ORDER — RENA-VITE PO TABS
1.0000 | ORAL_TABLET | Freq: Every day | ORAL | Status: DC
  Administered 2017-07-04: 1 via ORAL
  Filled 2017-07-04: qty 1

## 2017-07-04 MED ORDER — DICYCLOMINE HCL 10 MG/ML IM SOLN
20.0000 mg | Freq: Once | INTRAMUSCULAR | Status: AC
  Administered 2017-07-04: 20 mg via INTRAMUSCULAR
  Filled 2017-07-04: qty 2

## 2017-07-04 MED ORDER — CAPSAICIN 0.025 % EX CREA
TOPICAL_CREAM | Freq: Once | CUTANEOUS | Status: AC
  Administered 2017-07-04: 13:00:00 via TOPICAL
  Filled 2017-07-04: qty 60

## 2017-07-04 MED ORDER — PANTOPRAZOLE SODIUM 40 MG IV SOLR
40.0000 mg | Freq: Two times a day (BID) | INTRAVENOUS | Status: DC
  Administered 2017-07-04 (×2): 40 mg via INTRAVENOUS
  Filled 2017-07-04 (×2): qty 40

## 2017-07-04 MED ORDER — HALOPERIDOL LACTATE 5 MG/ML IJ SOLN
2.0000 mg | Freq: Once | INTRAMUSCULAR | Status: AC
  Administered 2017-07-04: 2 mg via INTRAVENOUS
  Filled 2017-07-04: qty 1

## 2017-07-04 MED ORDER — SODIUM CHLORIDE 0.9 % IV SOLN: 250.0000 mL | INTRAVENOUS | Status: DC | PRN

## 2017-07-04 MED ORDER — SEVELAMER CARBONATE 2.4 G PO PACK
2.4000 g | PACK | Freq: Three times a day (TID) | ORAL | Status: DC
  Administered 2017-07-04 – 2017-07-05 (×2): 2.4 g via ORAL
  Filled 2017-07-04 (×3): qty 1

## 2017-07-04 MED ORDER — SODIUM CHLORIDE 0.9 % IV SOLN
INTRAVENOUS | Status: DC
  Administered 2017-07-04: 08:00:00 via INTRAVENOUS

## 2017-07-04 MED ORDER — HEPARIN SODIUM (PORCINE) 5000 UNIT/ML IJ SOLN: 5000.0000 [IU] | Freq: Three times a day (TID) | INTRAMUSCULAR | Status: DC

## 2017-07-04 MED ORDER — MORPHINE SULFATE (PF) 4 MG/ML IV SOLN
4.0000 mg | INTRAVENOUS | Status: DC | PRN
  Administered 2017-07-05: 4 mg via INTRAVENOUS
  Filled 2017-07-04: qty 1

## 2017-07-04 MED ORDER — FAMOTIDINE IN NACL 20-0.9 MG/50ML-% IV SOLN
20.0000 mg | Freq: Once | INTRAVENOUS | Status: AC
  Administered 2017-07-04: 20 mg via INTRAVENOUS
  Filled 2017-07-04: qty 50

## 2017-07-04 MED ORDER — METOCLOPRAMIDE HCL 5 MG/ML IJ SOLN
10.0000 mg | Freq: Once | INTRAMUSCULAR | Status: AC
Start: 2017-07-04 — End: 2017-07-04
  Administered 2017-07-04: 10 mg via INTRAVENOUS
  Filled 2017-07-04: qty 2

## 2017-07-04 MED ORDER — MORPHINE SULFATE (PF) 4 MG/ML IV SOLN
4.0000 mg | Freq: Once | INTRAVENOUS | Status: AC
  Administered 2017-07-04: 4 mg via INTRAVENOUS
  Filled 2017-07-04: qty 1

## 2017-07-04 MED ORDER — ACETAMINOPHEN 650 MG RE SUPP: 650.0000 mg | Freq: Four times a day (QID) | RECTAL | Status: DC | PRN

## 2017-07-04 MED ORDER — IOPAMIDOL (ISOVUE-300) INJECTION 61%
INTRAVENOUS | Status: AC
  Filled 2017-07-04: qty 100

## 2017-07-04 MED ORDER — ACETAMINOPHEN 325 MG PO TABS
650.0000 mg | ORAL_TABLET | Freq: Four times a day (QID) | ORAL | Status: DC | PRN
  Administered 2017-07-04: 650 mg via ORAL
  Filled 2017-07-04: qty 2

## 2017-07-04 MED ORDER — SODIUM CHLORIDE 0.9 % IV SOLN
80.0000 mg | Freq: Once | INTRAVENOUS | Status: AC
  Administered 2017-07-04: 80 mg via INTRAVENOUS
  Filled 2017-07-04: qty 80

## 2017-07-04 MED ORDER — ONDANSETRON HCL 4 MG/2ML IJ SOLN: 4.0000 mg | Freq: Four times a day (QID) | INTRAMUSCULAR | Status: DC | PRN

## 2017-07-04 MED ORDER — PROMETHAZINE HCL 25 MG PO TABS: 12.5000 mg | ORAL_TABLET | Freq: Four times a day (QID) | ORAL | Status: DC | PRN

## 2017-07-04 MED ORDER — ONDANSETRON HCL 4 MG/2ML IJ SOLN
4.0000 mg | Freq: Once | INTRAMUSCULAR | Status: AC
Start: 2017-07-04 — End: 2017-07-04
  Administered 2017-07-04: 4 mg via INTRAVENOUS
  Filled 2017-07-04: qty 2

## 2017-07-04 NOTE — ED Provider Notes (Signed)
31 year old man on dialysis presents today complaining of abdominal pain and  hematemesis Diffuse abdominal tenderness to palpation.  Hemoccult positive.  Patient treated here with pain medicine and Protonix.  She is to be admitted to hospitalist service and GI is being consulted   I performed a history and physical examination of [name] and discussed his management with Ms. Karen Kays.  I agree with the history, physical, assessment, and plan of care, with the following exceptions: None  I was present for the following procedures: None Time Spent in Critical Care of the patient: None Time spent in discussions with the patient and family: 10  Alex Baxter, MD 07/04/17 1326

## 2017-07-04 NOTE — ED Triage Notes (Signed)
Per EMS- pt has had nausea vomitting with abdominal cramping since yesterday.  Vitals stable. Dialysis Friday completed.

## 2017-07-04 NOTE — H&P (View-Only) (Signed)
Consult Note   Referring Provider: Pattricia Boss, MD Primary Care Physician:  Nolene Ebbs, MD Primary Gastroenterologist:  Wilfrid Lund, MD  Reason for Consultation:  Abdominal pain, N/V, coffee ground emesis  HPI: [name] is a 31 y.o. male with a long-standing problem of recurrent upper abdominal pain, nausea and vomiting.  He has been evaluated on multiple occasions for this problem which appears to be related to his chronic kidney disease and hemodialysis.  He underwent EGD in January 2019 showing erosive gastritis and nonerosive duodenitis.  Biopsies did not show H. pylori.  Abdominal/pelvic CT and abdominal ultrasound in May 2018 showed a prior cholecystectomy and no active gastrointestinal problems.  Gastric emptying schedule in 2016 was normal.  Patient relates he developed upper abdominal pain yesterday associated with multiple episodes of nausea and vomiting including coffee-ground emesis.  Abdominal/pelvic CT was repeated today prior to my assessment and does not show any active gastrointestinal problems.   Past Medical History:  Diagnosis Date  . Arthritis    "all over" (11/19/2016)  . Chronic lower back pain   . Complication of anesthesia    A little while to wake up after knee surgery in 2008  . Dizziness    when coming off of dialysis  . ESRD (end stage renal disease) on dialysis [facility])    MWF and goes to [facility] (11/19/2016)  . Family history of anesthesia complication    mom slow to wake up  . GERD (gastroesophageal reflux disease)    takes Omeprazole as needed  . ACZYSAYT(016.0)    "monthly" (11/19/2016)  . History of hiatal hernia   . Hyperparathyroidism ([place])   . Hypertension   . Joint pain   . Joint swelling   . Morbid obesity ([place])   . PONV (postoperative nausea and vomiting)   . Renal insufficiency    Pt is on dyalisis and has been for 14 years    Past Surgical History:  Procedure Laterality Date  . AV FISTULA PLACEMENT Bilateral 2005,2007     "right; left"  . AV FISTULA REPAIR Right 2007   "tried to clean it out; couldn't do it"  . BREATH TEK H PYLORI N/A 05/15/2014   Procedure: BREATH TEK H PYLORI;  Surgeon: Alphonsa Overall, MD;  Location: [facility];  Service: General;  Laterality: N/A;  . CHOLECYSTECTOMY N/A 11/23/2016   Procedure: LAPAROSCOPIC CHOLECYSTECTOMY WITH INTRAOPERATIVE CHOLANGIOGRAM;  Surgeon: Erroll Luna, MD;  Location: [place];  Service: General;  Laterality: N/A;  . ESOPHAGOGASTRODUODENOSCOPY N/A 12/07/2014   Procedure: ESOPHAGOGASTRODUODENOSCOPY (EGD);  Surgeon: Alphonsa Overall, MD;  Location: [facility];  Service: General;  Laterality: N/A;  . ESOPHAGOGASTRODUODENOSCOPY N/A 12/17/2014   Procedure: ESOPHAGOGASTRODUODENOSCOPY (EGD);  Surgeon: Alphonsa Overall, MD;  Location: [place];  Service: General;  Laterality: N/A;  . ESOPHAGOGASTRODUODENOSCOPY N/A 08/16/2016   Procedure: ESOPHAGOGASTRODUODENOSCOPY (EGD);  Surgeon: Jerene Bears, MD;  Location: [place];  Service: Gastroenterology;  Laterality: N/A;  . HERNIA REPAIR  11/2014   w/lap band OR  . JOINT REPLACEMENT    . KNEE ARTHROSCOPY Left 2007  . LAPAROSCOPIC GASTRIC BANDING N/A 11/12/2014   Procedure: LAPAROSCOPIC GASTRIC BANDING;  Surgeon: Alphonsa Overall, MD;  Location: [facility];  Service: General;  Laterality: N/A;  . PARATHYROIDECTOMY N/A 03/30/2013   Procedure: TOTAL PARATHYROIDECTOMY WITH AUTOTRANSPLANT;  Surgeon: Earnstine Regal, MD;  Location: [place];  Service: General;  Laterality: N/A;  Autotransplant to left lower arm.  Marland Kitchen TOTAL KNEE ARTHROPLASTY Right 03/27/2014   Procedure: UNICOMPARTMENTAL  ARTHROPLASTY;  Surgeon: Meredith Pel, MD;  Location: [place];  Service: Orthopedics;  Laterality: Right;    Prior to Admission medications   Medication Sig Start Date End Date Taking? Authorizing Provider  albuterol (PROVENTIL HFA;VENTOLIN HFA) 108 (90 Base) MCG/ACT inhaler Inhale 2 puffs into the lungs every 4 (four) hours as needed for wheezing or shortness of breath.  05/14/16  Yes Oxford, Orson Ape, [name]  calcium carbonate (TUMS - DOSED IN MG ELEMENTAL CALCIUM) 500 MG chewable tablet Chew 2 tablets by mouth 2 (two) times daily as needed for indigestion or heartburn.    Yes [provider]  famotidine (PEPCID) 20 MG tablet Take 1 tablet (20 mg total) by mouth daily. 01/02/17 07/04/17 Yes Duffy Bruce, MD  metoCLOPramide (REGLAN) 5 MG tablet Take 1 tablet (5 mg total) by mouth every 8 (eight) hours as needed for nausea or vomiting. 01/02/17  Yes Duffy Bruce, MD  multivitamin (RENA-VIT) TABS tablet Take 1 tablet by mouth daily.   Yes [provider]  ondansetron (ZOFRAN) 4 MG tablet Take 1 tablet (4 mg total) by mouth every 6 (six) hours. 01/16/17  Yes Montine Circle, [name]  sevelamer carbonate (RENVELA) 2.4 G PACK Take 2.4 g by mouth 3 (three) times daily with meals.    Yes [provider]  dicyclomine (BENTYL) 20 MG tablet Take 1 tablet (20 mg total) by mouth 2 (two) times daily. Patient not taking: Reported on 07/04/2017 01/16/17   Montine Circle, [name]  HYDROcodone-acetaminophen (NORCO/VICODIN) 5-325 MG tablet Take 1-2 tablets by mouth every 6 (six) hours as needed. Patient not taking: Reported on 07/04/2017 01/16/17   Montine Circle, [name]  omeprazole (PRILOSEC) 20 MG capsule Take 1 capsule (20 mg total) by mouth daily. Patient not taking: Reported on 07/04/2017 07/27/16   Ezequiel Essex, MD  prochlorperazine (COMPAZINE) 25 MG suppository Place 1 suppository (25 mg total) rectally every 12 (twelve) hours as needed for nausea or vomiting. Patient not taking: Reported on 30/08/6008 9/32/35   Delora Fuel, MD  promethazine (PHENERGAN) 25 MG suppository Place 1 suppository (25 mg total) rectally every 8 (eight) hours as needed for refractory nausea / vomiting. Patient not taking: Reported on 07/04/2017 01/02/17   Duffy Bruce, MD    Current Facility-Administered Medications  Medication Dose Route Frequency Provider Last Rate Last Dose    . 0.9 %  sodium chloride infusion   Intravenous Continuous Pisciotta, Elmyra Ricks, [name]   Stopped at 07/04/17 1455  . iopamidol (ISOVUE-300) 61 % injection           . morphine 4 MG/ML injection 4 mg  4 mg Intravenous Q4H PRN Alma Friendly, MD      . multivitamin (RENA-VIT) tablet 1 tablet  1 tablet Oral QHS Alma Friendly, MD      . pantoprazole (PROTONIX) injection 40 mg  40 mg Intravenous Q12H Alma Friendly, MD      . sevelamer carbonate (RENVELA) powder PACK 2.4 g  2.4 g Oral TID WC Alma Friendly, MD       Current Outpatient Medications  Medication Sig Dispense Refill  . albuterol (PROVENTIL HFA;VENTOLIN HFA) 108 (90 Base) MCG/ACT inhaler Inhale 2 puffs into the lungs every 4 (four) hours as needed for wheezing or shortness of breath. 1 Inhaler 0  . calcium carbonate (TUMS - DOSED IN MG ELEMENTAL CALCIUM) 500 MG chewable tablet Chew 2 tablets by mouth 2 (two) times daily as needed for indigestion or heartburn.     . famotidine (PEPCID)  20 MG tablet Take 1 tablet (20 mg total) by mouth daily. 7 tablet 0  . metoCLOPramide (REGLAN) 5 MG tablet Take 1 tablet (5 mg total) by mouth every 8 (eight) hours as needed for nausea or vomiting. 20 tablet 0  . multivitamin (RENA-VIT) TABS tablet Take 1 tablet by mouth daily.    . ondansetron (ZOFRAN) 4 MG tablet Take 1 tablet (4 mg total) by mouth every 6 (six) hours. 12 tablet 0  . sevelamer carbonate (RENVELA) 2.4 G PACK Take 2.4 g by mouth 3 (three) times daily with meals.     . dicyclomine (BENTYL) 20 MG tablet Take 1 tablet (20 mg total) by mouth 2 (two) times daily. (Patient not taking: Reported on 07/04/2017) 20 tablet 0  . HYDROcodone-acetaminophen (NORCO/VICODIN) 5-325 MG tablet Take 1-2 tablets by mouth every 6 (six) hours as needed. (Patient not taking: Reported on 07/04/2017) 10 tablet 0  . omeprazole (PRILOSEC) 20 MG capsule Take 1 capsule (20 mg total) by mouth daily. (Patient not taking: Reported on 07/04/2017) 30 capsule 0   . prochlorperazine (COMPAZINE) 25 MG suppository Place 1 suppository (25 mg total) rectally every 12 (twelve) hours as needed for nausea or vomiting. (Patient not taking: Reported on 07/04/2017) 24 suppository 0  . promethazine (PHENERGAN) 25 MG suppository Place 1 suppository (25 mg total) rectally every 8 (eight) hours as needed for refractory nausea / vomiting. (Patient not taking: Reported on 07/04/2017) 12 each 0    Allergies as of 07/04/2017 - Review Complete 07/04/2017  Allergen Reaction Noted  . Dilaudid [hydromorphone hcl] Other (See Comments) 08/13/2016    Family History  Problem Relation Age of Onset  . Hypertension Mother   . Diabetes Father   . Kidney Stones Sister   . Diabetes Maternal Grandmother   . Liver cancer Maternal Uncle     Social History   Socioeconomic History  . Marital status: Single    Spouse name: Not on file  . Number of children: 0  . Years of education: Not on file  . Highest education level: Not on file  Social Needs  . Financial resource strain: Not on file  . Food insecurity - worry: Not on file  . Food insecurity - inability: Not on file  . Transportation needs - medical: Not on file  . Transportation needs - non-medical: Not on file  Occupational History  . Not on file  Tobacco Use  . Smoking status: Current Every Day Smoker    Packs/day: 0.10    Years: 16.00    Pack years: 1.60    Types: Cigarettes    Last attempt to quit: 12/17/2014    Years since quitting: 2.5  . Smokeless tobacco: Never Used  Substance and Sexual Activity  . Alcohol use: No    Alcohol/week: 0.0 oz  . Drug use: No  . Sexual activity: Yes  Other Topics Concern  . Not on file  Social History Narrative  . Not on file    Review of Systems: Gen: Denies any fever, chills, sweats, anorexia, fatigue, weakness, malaise, weight loss, and sleep disorder CV: Denies chest pain, angina, palpitations, syncope, orthopnea, PND, peripheral edema, and claudication. Resp:  Denies dyspnea at rest, dyspnea with exercise, cough, sputum, wheezing, coughing up blood, and pleurisy. GI: Denies vomiting blood, jaundice, and fecal incontinence.   Denies dysphagia or odynophagia. GU : Denies urinary burning, blood in urine, urinary frequency, urinary hesitancy, nocturnal urination, and urinary incontinence. MS: Denies joint pain, limitation of movement, and swelling,  stiffness, low back pain, extremity pain. Denies muscle weakness, cramps, atrophy.  Derm: Denies rash, itching, dry skin, hives, moles, warts, or unhealing ulcers.  Psych: Denies depression, anxiety, memory loss, suicidal ideation, hallucinations, paranoia, and confusion. Heme: Denies bruising, bleeding, and enlarged lymph nodes. Neuro:  Denies any headaches, dizziness, paresthesias. Endo:  Denies any problems with DM, thyroid, adrenal function.  Physical Exam: Vital signs in last 24 hours: Temp:  [99 F (37.2 C)] 99 F (37.2 C) (12/02 0756) Pulse Rate:  [81-92] 90 (12/02 1430) Resp:  [18] 18 (12/02 0756) BP: (115-134)/(74-94) 115/79 (12/02 1351) SpO2:  [77 %-100 %] 97 % (12/02 1430) Weight:  [311 lb 1.1 oz (141.1 kg)] 311 lb 1.1 oz (141.1 kg) (12/02 0756)    General:  Alert, well-developed, well-nourished, obese, in NAD Head:  Normocephalic and atraumatic. Eyes:  Sclera clear, no icterus. Conjunctiva pink. Ears:  Normal auditory acuity. Nose:  No deformity, discharge, or lesions. Mouth:  No deformity or lesions. Oropharynx pink & moist. Neck:  Supple; no masses or thyromegaly. Chest:  Clear throughout to auscultation. No wheezes, crackles, or rhonchi. No acute distress. Heart:  Regular rate and rhythm; no murmurs, clicks, rubs, or gallops. Abdomen:  Soft, nontender and nondistended. No masses, hepatosplenomegaly or hernias noted. Normal bowel sounds, without guarding, and without rebound.   Rectal:  Deferred, heme + stool per EDP Msk:  Symmetrical without gross deformities. Normal posture. Pulses:   Normal pulses noted. Extremities:  Without clubbing or edema. Neurologic:  Alert and  oriented x4;  grossly normal neurologically. Skin:  Intact without significant lesions or rashes. Cervical Nodes:  No significant cervical adenopathy. Psych:  Alert and cooperative. Normal mood and affect.  I Lab Results: Recent Labs    07/04/17 0810  WBC 12.1*  HGB 13.9  HCT 40.3  PLT 212   BMET Recent Labs    07/04/17 0810  NA 134*  K 3.5  CL 90*  CO2 21*  GLUCOSE 125*  BUN 44*  CREATININE 12.82*  CALCIUM 8.1*   LFT Recent Labs    07/04/17 0810  PROT 9.1*  ALBUMIN 4.4  AST 21  ALT 20  ALKPHOS 82  BILITOT 1.1    Studies/Results: Ct Abdomen Pelvis Wo Contrast  Result Date: 07/04/2017 CLINICAL DATA:  Low abdominal pain with nausea vomiting since yesterday. EXAM: CT ABDOMEN AND PELVIS WITHOUT CONTRAST TECHNIQUE: Multidetector CT imaging of the abdomen and pelvis was performed following the standard protocol without IV contrast. COMPARISON:  12/05/2016 FINDINGS: Lower chest: Basilar atelectasis noted bilaterally. 3 mm right lower lobe pulmonary nodule stable in the interval and previously characterized as benign. Hepatobiliary: No focal abnormality in the liver on this study without intravenous contrast. Gallbladder surgically absent. No intrahepatic or extrahepatic biliary dilation. Pancreas: No focal mass lesion. No dilatation of the main duct. No intraparenchymal cyst. No peripancreatic edema. Spleen: No splenomegaly. No focal mass lesion. Adrenals/Urinary Tract: No adrenal nodule or mass. Multiple cystic lesions identified in each kidney, incompletely characterize on this noncontrast exam but similar to prior study. No evidence for hydroureter. Bladder is decompressed. Stomach/Bowel: Stomach is nondistended. No gastric wall thickening. No evidence of outlet obstruction. Duodenum is normally positioned as is the ligament of Treitz. No small bowel wall thickening. No small bowel  dilatation. The terminal ileum is normal. The appendix is normal. No gross colonic mass. No colonic wall thickening. No substantial diverticular change. Vascular/Lymphatic: No abdominal aortic aneurysm. No abdominal aortic atherosclerotic calcification. There is no gastrohepatic or hepatoduodenal ligament lymphadenopathy.  No intraperitoneal or retroperitoneal lymphadenopathy. No pelvic sidewall lymphadenopathy. Reproductive: Prostate gland not well seen due to streak artifact body habitus. Other: No intraperitoneal free fluid. Musculoskeletal: Bone windows reveal no worrisome lytic or sclerotic osseous lesions. IMPRESSION: 1. No acute findings in the abdomen or pelvis. Specifically, no findings to explain the patient's history of low abdominal pain with nausea vomiting. 2. Stable appearance small kidneys bilaterally with numerous bilateral cystic renal lesions. Electronically Signed   By: Misty Stanley M.D.   On: 07/04/2017 15:18     Previous Endoscopies: See above  Impression/ Recommendations: 1.  Recurrent abdominal pain associated with nausea and vomiting. Lap band in 2016.  Coffee-ground emesis on this occasion with a stable Hb and heme + stool. Symptomatic management with pain control, anti-emetics, IV PPI, clear liquids.  EGD tomorrow. Consider UGI series to further assess lap band. Consider colonoscopy.   2. ESRD on HD.  Creatinine today is 12.82.    LOS: 0 days   Atanacio Melnyk T. Fuller Plan MD 07/04/2017, 3:25 PM [phone] Mon-Fri 8a-5p  [phone] after 5p, weekends, holidays

## 2017-07-04 NOTE — Consult Note (Signed)
Consult Note   Referring Provider: Pattricia Boss, MD Primary Care Physician:  Nolene Ebbs, MD Primary Gastroenterologist:  Wilfrid Lund, MD  Reason for Consultation:  Abdominal pain, N/V, coffee ground emesis  HPI: JAQUAVION MCCANNON is a 31 y.o. male with a long-standing problem of recurrent upper abdominal pain, nausea and vomiting.  He has been evaluated on multiple occasions for this problem which appears to be related to his chronic kidney disease and hemodialysis.  He underwent EGD in January 2019 showing erosive gastritis and nonerosive duodenitis.  Biopsies did not show H. pylori.  Abdominal/pelvic CT and abdominal ultrasound in May 2018 showed a prior cholecystectomy and no active gastrointestinal problems.  Gastric emptying schedule in 2016 was normal.  Patient relates he developed upper abdominal pain yesterday associated with multiple episodes of nausea and vomiting including coffee-ground emesis.  Abdominal/pelvic CT was repeated today prior to my assessment and does not show any active gastrointestinal problems.   Past Medical History:  Diagnosis Date  . Arthritis    "all over" (11/19/2016)  . Chronic lower back pain   . Complication of anesthesia    A little while to wake up after knee surgery in 2008  . Dizziness    when coming off of dialysis  . ESRD (end stage renal disease) on dialysis [facility])    MWF and goes to [facility] (11/19/2016)  . Family history of anesthesia complication    mom slow to wake up  . GERD (gastroesophageal reflux disease)    takes Omeprazole as needed  . RXVQMGQQ(761.9)    "monthly" (11/19/2016)  . History of hiatal hernia   . Hyperparathyroidism ([place])   . Hypertension   . Joint pain   . Joint swelling   . Morbid obesity ([place])   . PONV (postoperative nausea and vomiting)   . Renal insufficiency    Pt is on dyalisis and has been for 14 years    Past Surgical History:  Procedure Laterality Date  . AV FISTULA PLACEMENT Bilateral 2005,2007     "right; left"  . AV FISTULA REPAIR Right 2007   "tried to clean it out; couldn't do it"  . BREATH TEK H PYLORI N/A 05/15/2014   Procedure: BREATH TEK H PYLORI;  Surgeon: Alphonsa Overall, MD;  Location: [facility];  Service: General;  Laterality: N/A;  . CHOLECYSTECTOMY N/A 11/23/2016   Procedure: LAPAROSCOPIC CHOLECYSTECTOMY WITH INTRAOPERATIVE CHOLANGIOGRAM;  Surgeon: Erroll Luna, MD;  Location: [place];  Service: General;  Laterality: N/A;  . ESOPHAGOGASTRODUODENOSCOPY N/A 12/07/2014   Procedure: ESOPHAGOGASTRODUODENOSCOPY (EGD);  Surgeon: Alphonsa Overall, MD;  Location[facility];  Service: General;  Laterality: N/A;  . ESOPHAGOGASTRODUODENOSCOPY N/A 12/17/2014   Procedure: ESOPHAGOGASTRODUODENOSCOPY (EGD);  Surgeon: Alphonsa Overall, MD;  Location: [place];  Service: General;  Laterality: N/A;  . ESOPHAGOGASTRODUODENOSCOPY N/A 08/16/2016   Procedure: ESOPHAGOGASTRODUODENOSCOPY (EGD);  Surgeon: Jerene Bears, MD;  Location: [place];  Service: Gastroenterology;  Laterality: N/A;  . HERNIA REPAIR  11/2014   w/lap band OR  . JOINT REPLACEMENT    . KNEE ARTHROSCOPY Left 2007  . LAPAROSCOPIC GASTRIC BANDING N/A 11/12/2014   Procedure: LAPAROSCOPIC GASTRIC BANDING;  Surgeon: Alphonsa Overall, MD;  Location: [facility];  Service: General;  Laterality: N/A;  . PARATHYROIDECTOMY N/A 03/30/2013   Procedure: TOTAL PARATHYROIDECTOMY WITH AUTOTRANSPLANT;  Surgeon: Earnstine Regal, MD;  Location: [place];  Service: General;  Laterality: N/A;  Autotransplant to left lower arm.  Marland Kitchen TOTAL KNEE ARTHROPLASTY Right 03/27/2014   Procedure: UNICOMPARTMENTAL  ARTHROPLASTY;  Surgeon: Meredith Pel, MD;  Location: [place];  Service: Orthopedics;  Laterality: Right;    Prior to Admission medications   Medication Sig Start Date End Date Taking? Authorizing Provider  albuterol (PROVENTIL HFA;VENTOLIN HFA) 108 (90 Base) MCG/ACT inhaler Inhale 2 puffs into the lungs every 4 (four) hours as needed for wheezing or shortness of breath.  05/14/16  Yes Oxford, Orson Ape, [name]  calcium carbonate (TUMS - DOSED IN MG ELEMENTAL CALCIUM) 500 MG chewable tablet Chew 2 tablets by mouth 2 (two) times daily as needed for indigestion or heartburn.    Yes [provider]  famotidine (PEPCID) 20 MG tablet Take 1 tablet (20 mg total) by mouth daily. 01/02/17 07/04/17 Yes Duffy Bruce, MD  metoCLOPramide (REGLAN) 5 MG tablet Take 1 tablet (5 mg total) by mouth every 8 (eight) hours as needed for nausea or vomiting. 01/02/17  Yes Duffy Bruce, MD  multivitamin (RENA-VIT) TABS tablet Take 1 tablet by mouth daily.   Yes [provider]  ondansetron (ZOFRAN) 4 MG tablet Take 1 tablet (4 mg total) by mouth every 6 (six) hours. 01/16/17  Yes Montine Circle, [name]  sevelamer carbonate (RENVELA) 2.4 G PACK Take 2.4 g by mouth 3 (three) times daily with meals.    Yes [provider]  dicyclomine (BENTYL) 20 MG tablet Take 1 tablet (20 mg total) by mouth 2 (two) times daily. Patient not taking: Reported on 07/04/2017 01/16/17   Montine Circle, [name]  HYDROcodone-acetaminophen (NORCO/VICODIN) 5-325 MG tablet Take 1-2 tablets by mouth every 6 (six) hours as needed. Patient not taking: Reported on 07/04/2017 01/16/17   Montine Circle, [name]  omeprazole (PRILOSEC) 20 MG capsule Take 1 capsule (20 mg total) by mouth daily. Patient not taking: Reported on 07/04/2017 07/27/16   Ezequiel Essex, MD  prochlorperazine (COMPAZINE) 25 MG suppository Place 1 suppository (25 mg total) rectally every 12 (twelve) hours as needed for nausea or vomiting. Patient not taking: Reported on 28/10/6627 4/76/54   Delora Fuel, MD  promethazine (PHENERGAN) 25 MG suppository Place 1 suppository (25 mg total) rectally every 8 (eight) hours as needed for refractory nausea / vomiting. Patient not taking: Reported on 07/04/2017 01/02/17   Duffy Bruce, MD    Current Facility-Administered Medications  Medication Dose Route Frequency Provider Last Rate Last Dose    . 0.9 %  sodium chloride infusion   Intravenous Continuous Pisciotta, Elmyra Ricks, [name]   Stopped at 07/04/17 1455  . iopamidol (ISOVUE-300) 61 % injection           . morphine 4 MG/ML injection 4 mg  4 mg Intravenous Q4H PRN Alma Friendly, MD      . multivitamin (RENA-VIT) tablet 1 tablet  1 tablet Oral QHS Alma Friendly, MD      . pantoprazole (PROTONIX) injection 40 mg  40 mg Intravenous Q12H Alma Friendly, MD      . sevelamer carbonate (RENVELA) powder PACK 2.4 g  2.4 g Oral TID WC Alma Friendly, MD       Current Outpatient Medications  Medication Sig Dispense Refill  . albuterol (PROVENTIL HFA;VENTOLIN HFA) 108 (90 Base) MCG/ACT inhaler Inhale 2 puffs into the lungs every 4 (four) hours as needed for wheezing or shortness of breath. 1 Inhaler 0  . calcium carbonate (TUMS - DOSED IN MG ELEMENTAL CALCIUM) 500 MG chewable tablet Chew 2 tablets by mouth 2 (two) times daily as needed for indigestion or heartburn.     . famotidine (PEPCID)  20 MG tablet Take 1 tablet (20 mg total) by mouth daily. 7 tablet 0  . metoCLOPramide (REGLAN) 5 MG tablet Take 1 tablet (5 mg total) by mouth every 8 (eight) hours as needed for nausea or vomiting. 20 tablet 0  . multivitamin (RENA-VIT) TABS tablet Take 1 tablet by mouth daily.    . ondansetron (ZOFRAN) 4 MG tablet Take 1 tablet (4 mg total) by mouth every 6 (six) hours. 12 tablet 0  . sevelamer carbonate (RENVELA) 2.4 G PACK Take 2.4 g by mouth 3 (three) times daily with meals.     . dicyclomine (BENTYL) 20 MG tablet Take 1 tablet (20 mg total) by mouth 2 (two) times daily. (Patient not taking: Reported on 07/04/2017) 20 tablet 0  . HYDROcodone-acetaminophen (NORCO/VICODIN) 5-325 MG tablet Take 1-2 tablets by mouth every 6 (six) hours as needed. (Patient not taking: Reported on 07/04/2017) 10 tablet 0  . omeprazole (PRILOSEC) 20 MG capsule Take 1 capsule (20 mg total) by mouth daily. (Patient not taking: Reported on 07/04/2017) 30 capsule 0   . prochlorperazine (COMPAZINE) 25 MG suppository Place 1 suppository (25 mg total) rectally every 12 (twelve) hours as needed for nausea or vomiting. (Patient not taking: Reported on 07/04/2017) 24 suppository 0  . promethazine (PHENERGAN) 25 MG suppository Place 1 suppository (25 mg total) rectally every 8 (eight) hours as needed for refractory nausea / vomiting. (Patient not taking: Reported on 07/04/2017) 12 each 0    Allergies as of 07/04/2017 - Review Complete 07/04/2017  Allergen Reaction Noted  . Dilaudid [hydromorphone hcl] Other (See Comments) 08/13/2016    Family History  Problem Relation Age of Onset  . Hypertension Mother   . Diabetes Father   . Kidney Stones Sister   . Diabetes Maternal Grandmother   . Liver cancer Maternal Uncle     Social History   Socioeconomic History  . Marital status: Single    Spouse name: Not on file  . Number of children: 0  . Years of education: Not on file  . Highest education level: Not on file  Social Needs  . Financial resource strain: Not on file  . Food insecurity - worry: Not on file  . Food insecurity - inability: Not on file  . Transportation needs - medical: Not on file  . Transportation needs - non-medical: Not on file  Occupational History  . Not on file  Tobacco Use  . Smoking status: Current Every Day Smoker    Packs/day: 0.10    Years: 16.00    Pack years: 1.60    Types: Cigarettes    Last attempt to quit: 12/17/2014    Years since quitting: 2.5  . Smokeless tobacco: Never Used  Substance and Sexual Activity  . Alcohol use: No    Alcohol/week: 0.0 oz  . Drug use: No  . Sexual activity: Yes  Other Topics Concern  . Not on file  Social History Narrative  . Not on file    Review of Systems: Gen: Denies any fever, chills, sweats, anorexia, fatigue, weakness, malaise, weight loss, and sleep disorder CV: Denies chest pain, angina, palpitations, syncope, orthopnea, PND, peripheral edema, and claudication. Resp:  Denies dyspnea at rest, dyspnea with exercise, cough, sputum, wheezing, coughing up blood, and pleurisy. GI: Denies vomiting blood, jaundice, and fecal incontinence.   Denies dysphagia or odynophagia. GU : Denies urinary burning, blood in urine, urinary frequency, urinary hesitancy, nocturnal urination, and urinary incontinence. MS: Denies joint pain, limitation of movement, and swelling,  stiffness, low back pain, extremity pain. Denies muscle weakness, cramps, atrophy.  Derm: Denies rash, itching, dry skin, hives, moles, warts, or unhealing ulcers.  Psych: Denies depression, anxiety, memory loss, suicidal ideation, hallucinations, paranoia, and confusion. Heme: Denies bruising, bleeding, and enlarged lymph nodes. Neuro:  Denies any headaches, dizziness, paresthesias. Endo:  Denies any problems with DM, thyroid, adrenal function.  Physical Exam: Vital signs in last 24 hours: Temp:  [99 F (37.2 C)] 99 F (37.2 C) (12/02 0756) Pulse Rate:  [81-92] 90 (12/02 1430) Resp:  [18] 18 (12/02 0756) BP: (115-134)/(74-94) 115/79 (12/02 1351) SpO2:  [77 %-100 %] 97 % (12/02 1430) Weight:  [311 lb 1.1 oz (141.1 kg)] 311 lb 1.1 oz (141.1 kg) (12/02 0756)    General:  Alert, well-developed, well-nourished, obese, in NAD Head:  Normocephalic and atraumatic. Eyes:  Sclera clear, no icterus. Conjunctiva pink. Ears:  Normal auditory acuity. Nose:  No deformity, discharge, or lesions. Mouth:  No deformity or lesions. Oropharynx pink & moist. Neck:  Supple; no masses or thyromegaly. Chest:  Clear throughout to auscultation. No wheezes, crackles, or rhonchi. No acute distress. Heart:  Regular rate and rhythm; no murmurs, clicks, rubs, or gallops. Abdomen:  Soft, nontender and nondistended. No masses, hepatosplenomegaly or hernias noted. Normal bowel sounds, without guarding, and without rebound.   Rectal:  Deferred, heme + stool per EDP Msk:  Symmetrical without gross deformities. Normal posture. Pulses:   Normal pulses noted. Extremities:  Without clubbing or edema. Neurologic:  Alert and  oriented x4;  grossly normal neurologically. Skin:  Intact without significant lesions or rashes. Cervical Nodes:  No significant cervical adenopathy. Psych:  Alert and cooperative. Normal mood and affect.  I Lab Results: Recent Labs    07/04/17 0810  WBC 12.1*  HGB 13.9  HCT 40.3  PLT 212   BMET Recent Labs    07/04/17 0810  NA 134*  K 3.5  CL 90*  CO2 21*  GLUCOSE 125*  BUN 44*  CREATININE 12.82*  CALCIUM 8.1*   LFT Recent Labs    07/04/17 0810  PROT 9.1*  ALBUMIN 4.4  AST 21  ALT 20  ALKPHOS 82  BILITOT 1.1    Studies/Results: Ct Abdomen Pelvis Wo Contrast  Result Date: 07/04/2017 CLINICAL DATA:  Low abdominal pain with nausea vomiting since yesterday. EXAM: CT ABDOMEN AND PELVIS WITHOUT CONTRAST TECHNIQUE: Multidetector CT imaging of the abdomen and pelvis was performed following the standard protocol without IV contrast. COMPARISON:  12/05/2016 FINDINGS: Lower chest: Basilar atelectasis noted bilaterally. 3 mm right lower lobe pulmonary nodule stable in the interval and previously characterized as benign. Hepatobiliary: No focal abnormality in the liver on this study without intravenous contrast. Gallbladder surgically absent. No intrahepatic or extrahepatic biliary dilation. Pancreas: No focal mass lesion. No dilatation of the main duct. No intraparenchymal cyst. No peripancreatic edema. Spleen: No splenomegaly. No focal mass lesion. Adrenals/Urinary Tract: No adrenal nodule or mass. Multiple cystic lesions identified in each kidney, incompletely characterize on this noncontrast exam but similar to prior study. No evidence for hydroureter. Bladder is decompressed. Stomach/Bowel: Stomach is nondistended. No gastric wall thickening. No evidence of outlet obstruction. Duodenum is normally positioned as is the ligament of Treitz. No small bowel wall thickening. No small bowel  dilatation. The terminal ileum is normal. The appendix is normal. No gross colonic mass. No colonic wall thickening. No substantial diverticular change. Vascular/Lymphatic: No abdominal aortic aneurysm. No abdominal aortic atherosclerotic calcification. There is no gastrohepatic or hepatoduodenal ligament lymphadenopathy.  No intraperitoneal or retroperitoneal lymphadenopathy. No pelvic sidewall lymphadenopathy. Reproductive: Prostate gland not well seen due to streak artifact body habitus. Other: No intraperitoneal free fluid. Musculoskeletal: Bone windows reveal no worrisome lytic or sclerotic osseous lesions. IMPRESSION: 1. No acute findings in the abdomen or pelvis. Specifically, no findings to explain the patient's history of low abdominal pain with nausea vomiting. 2. Stable appearance small kidneys bilaterally with numerous bilateral cystic renal lesions. Electronically Signed   By: Misty Stanley M.D.   On: 07/04/2017 15:18     Previous Endoscopies: See above  Impression/ Recommendations: 1.  Recurrent abdominal pain associated with nausea and vomiting. Lap band in 2016.  Coffee-ground emesis on this occasion with a stable Hb and heme + stool. Symptomatic management with pain control, anti-emetics, IV PPI, clear liquids.  EGD tomorrow. Consider UGI series to further assess lap band. Consider colonoscopy.   2. ESRD on HD.  Creatinine today is 12.82.    LOS: 0 days   Charese Abundis T. Fuller Plan MD 07/04/2017, 3:25 PM [phone] Mon-Fri 8a-5p  [phone] after 5p, weekends, holidays

## 2017-07-04 NOTE — H&P (Signed)
History and Physical  [name] QMV:[id number] DOB: May 12, 1986 DOA: 07/04/2017  Referring physician: ER PCP: Nolene Ebbs, MD  Outpatient Specialists: Nephrology Patient coming from: Home & is able to ambulate  Chief Complaint: Abdominal pain, nausea/vomiting   HPI: [name] is a 31 y.o. male with medical history significant for ESRD on HD, GERD, gastroparesis, HTN, morbid obesity presents with c/o abdominal pain, N/V for 1 day. Pt reported pain started across the lower quadrant of his abdomen, currently worse now around the epigastric region. Pain is sharp, constant, no radiation, tried pepto-bismol at home, but vomited. Nothing makes it better. Associated with nausea and had episodes of coffee ground emesis of which prompted him to go to the ER. Pt denied any diarrhea, dark tarry stools/melena, fever/chills, sick contacts, chest pain, SOB, cough, any marijuana/alcohol abuse  Of note, pt has had multiple visit for similar issue of which extensive work up has been done this past year. Pt has had gastric banding before, but was reversed. Gastroparesis was suspected, a gastric emptying study done in 2016 was negative, but a recent endoscopy done in 08/2016 showed retained fluid in stomach and mild gastritis. H. pylori was negative. Pt was also admitted in 4/18 for symptomatic cholelithiasis of which lap chole was done.  ED Course: Pt was treated symptomatically. Labs fairly stable except for Cr. CT abdomen was negative. Pt admitted for possible UGI bleed, with consult to GI  Review of Systems: Review of systems are otherwise negative, except for the ones mentioned above   Past Medical History:  Diagnosis Date  . Arthritis    "all over" (11/19/2016)  . Chronic lower back pain   . Complication of anesthesia    A little while to wake up after knee surgery in 2008  . Dizziness    when coming off of dialysis  . ESRD (end stage renal disease) on dialysis [facility])    MWF and goes to  [facility] (11/19/2016)  . Family history of anesthesia complication    mom slow to wake up  . GERD (gastroesophageal reflux disease)    takes Omeprazole as needed  . MWUXLKGM(010.2)    "monthly" (11/19/2016)  . History of hiatal hernia   . Hyperparathyroidism ([place])   . Hypertension   . Joint pain   . Joint swelling   . Morbid obesity ([place])   . PONV (postoperative nausea and vomiting)   . Renal insufficiency    Pt is on dyalisis and has been for 14 years   Past Surgical History:  Procedure Laterality Date  . AV FISTULA PLACEMENT Bilateral 2005,2007   "right; left"  . AV FISTULA REPAIR Right 2007   "tried to clean it out; couldn't do it"  . BREATH TEK H PYLORI N/A 05/15/2014   Procedure: BREATH TEK H PYLORI;  Surgeon: Alphonsa Overall, MD;  Location: [facility];  Service: General;  Laterality: N/A;  . CHOLECYSTECTOMY N/A 11/23/2016   Procedure: LAPAROSCOPIC CHOLECYSTECTOMY WITH INTRAOPERATIVE CHOLANGIOGRAM;  Surgeon: Erroll Luna, MD;  Location: [place];  Service: General;  Laterality: N/A;  . ESOPHAGOGASTRODUODENOSCOPY N/A 12/07/2014   Procedure: ESOPHAGOGASTRODUODENOSCOPY (EGD);  Surgeon: Alphonsa Overall, MD;  [facility] END[facility]COPY;  Service: General;  Laterality: N/A;  . ESOPHAGOGASTRODUODENOSCOPY N/A 12/17/2014   Procedure: ESOPHAGOGASTRODUODENOSCOPY (EGD);  Surgeon: Alphonsa Overall, MD;  Location: [place];  Service: General;  Laterality: N/A;  . ESOPHAGOGASTRODUODENOSCOPY N/A 08/16/2016   Procedure: ESOPHAGOGASTRODUODENOSCOPY (EGD);  Surgeon: Jerene Bears, MD;  Location: [place];  Service: Gastroenterology;  Laterality:  N/A;  . HERNIA REPAIR  11/2014   w/lap band OR  . JOINT REPLACEMENT    . KNEE ARTHROSCOPY Left 2007  . LAPAROSCOPIC GASTRIC BANDING N/A 11/12/2014   Procedure: LAPAROSCOPIC GASTRIC BANDING;  Surgeon: Alphonsa Overall, MD;  Location: [facility];  Service: General;  Laterality: N/A;  . PARATHYROIDECTOMY N/A 03/30/2013   Procedure: TOTAL PARATHYROIDECTOMY WITH AUTOTRANSPLANT;   Surgeon: Earnstine Regal, MD;  Location: [place];  Service: General;  Laterality: N/A;  Autotransplant to left lower arm.  Marland Kitchen TOTAL KNEE ARTHROPLASTY Right 03/27/2014   Procedure: UNICOMPARTMENTAL ARTHROPLASTY;  Surgeon: Meredith Pel, MD;  Location: [place];  Service: Orthopedics;  Laterality: Right;    Social History:  reports that he has been smoking cigarettes.  He has a 1.60 pack-year smoking history. he has never used smokeless tobacco. He reports that he does not drink alcohol or use drugs.   Allergies  Allergen Reactions  . Dilaudid [Hydromorphone Hcl] Other (See Comments)    Patient became hypoxic to 60% with dilaudid 1mg  IV    Family History  Problem Relation Age of Onset  . Hypertension Mother   . Diabetes Father   . Kidney Stones Sister   . Diabetes Maternal Grandmother   . Liver cancer Maternal Uncle     None  Prior to Admission medications   Medication Sig Start Date End Date Taking? Authorizing Provider  albuterol (PROVENTIL HFA;VENTOLIN HFA) 108 (90 Base) MCG/ACT inhaler Inhale 2 puffs into the lungs every 4 (four) hours as needed for wheezing or shortness of breath. 05/14/16  Yes Oxford, Orson Ape, [name]  calcium carbonate (TUMS - DOSED IN MG ELEMENTAL CALCIUM) 500 MG chewable tablet Chew 2 tablets by mouth 2 (two) times daily as needed for indigestion or heartburn.    Yes [provider]  famotidine (PEPCID) 20 MG tablet Take 1 tablet (20 mg total) by mouth daily. 01/02/17 07/04/17 Yes Duffy Bruce, MD  metoCLOPramide (REGLAN) 5 MG tablet Take 1 tablet (5 mg total) by mouth every 8 (eight) hours as needed for nausea or vomiting. 01/02/17  Yes Duffy Bruce, MD  multivitamin (RENA-VIT) TABS tablet Take 1 tablet by mouth daily.   Yes [provider]  ondansetron (ZOFRAN) 4 MG tablet Take 1 tablet (4 mg total) by mouth every 6 (six) hours. 01/16/17  Yes Montine Circle, [name]  sevelamer carbonate (RENVELA) 2.4 G PACK Take 2.4 g by mouth 3 (three) times daily  with meals.    Yes [provider]  dicyclomine (BENTYL) 20 MG tablet Take 1 tablet (20 mg total) by mouth 2 (two) times daily. Patient not taking: Reported on 07/04/2017 01/16/17   Montine Circle, [name]  HYDROcodone-acetaminophen (NORCO/VICODIN) 5-325 MG tablet Take 1-2 tablets by mouth every 6 (six) hours as needed. Patient not taking: Reported on 07/04/2017 01/16/17   Montine Circle, [name]  omeprazole (PRILOSEC) 20 MG capsule Take 1 capsule (20 mg total) by mouth daily. Patient not taking: Reported on 07/04/2017 07/27/16   Ezequiel Essex, MD  prochlorperazine (COMPAZINE) 25 MG suppository Place 1 suppository (25 mg total) rectally every 12 (twelve) hours as needed for nausea or vomiting. Patient not taking: Reported on 04/05/2354 7/32/20   Delora Fuel, MD  promethazine (PHENERGAN) 25 MG suppository Place 1 suppository (25 mg total) rectally every 8 (eight) hours as needed for refractory nausea / vomiting. Patient not taking: Reported on 07/04/2017 01/02/17   Duffy Bruce, MD    Physical Exam: BP 111/61 (BP Location: Right Arm)   Pulse 84  Temp 98.5 F (36.9 C) (Oral)   Resp 16   Ht 5\' 6"  (1.676 m)   Wt (!) 140 kg (308 lb 9.6 oz)   SpO2 95%   BMI 49.81 kg/m   General:  Alert, awake, oriented X 3, morbid obesity Eyes: Normal ENT: Normal Neck: Normal Cardiovascular: S1-S2 present, no added hrt sound Respiratory: Chest clear bilaterally Abdomen: Obese, TTP on lower quadrant/epigastric region, non-distended, BS present Skin: Normal Musculoskeletal: No bilateral pedal edema Psychiatric: Normal Neurologic: No focal neurologic deficit           Labs on Admission:  Basic Metabolic Panel: Recent Labs  Lab 07/04/17 0810  NA 134*  K 3.5  CL 90*  CO2 21*  GLUCOSE 125*  BUN 44*  CREATININE 12.82*  CALCIUM 8.1*   Liver Function Tests: Recent Labs  Lab 07/04/17 0810  AST 21  ALT 20  ALKPHOS 82  BILITOT 1.1  PROT 9.1*  ALBUMIN 4.4   Recent Labs  Lab  07/04/17 0810  LIPASE 28   No results for input(s): AMMONIA in the last 168 hours. CBC: Recent Labs  Lab 07/04/17 0810  WBC 12.1*  HGB 13.9  HCT 40.3  MCV 85.0  PLT 212   Cardiac Enzymes: No results for input(s): CKTOTAL, CKMB, CKMBINDEX, TROPONINI in the last 168 hours.  BNP (last 3 results) No results for input(s): BNP in the last 8760 hours.  ProBNP (last 3 results) No results for input(s): PROBNP in the last 8760 hours.  CBG: No results for input(s): GLUCAP in the last 168 hours.  Radiological Exams on Admission: Ct Abdomen Pelvis Wo Contrast  Result Date: 07/04/2017 CLINICAL DATA:  Low abdominal pain with nausea vomiting since yesterday. EXAM: CT ABDOMEN AND PELVIS WITHOUT CONTRAST TECHNIQUE: Multidetector CT imaging of the abdomen and pelvis was performed following the standard protocol without IV contrast. COMPARISON:  12/05/2016 FINDINGS: Lower chest: Basilar atelectasis noted bilaterally. 3 mm right lower lobe pulmonary nodule stable in the interval and previously characterized as benign. Hepatobiliary: No focal abnormality in the liver on this study without intravenous contrast. Gallbladder surgically absent. No intrahepatic or extrahepatic biliary dilation. Pancreas: No focal mass lesion. No dilatation of the main duct. No intraparenchymal cyst. No peripancreatic edema. Spleen: No splenomegaly. No focal mass lesion. Adrenals/Urinary Tract: No adrenal nodule or mass. Multiple cystic lesions identified in each kidney, incompletely characterize on this noncontrast exam but similar to prior study. No evidence for hydroureter. Bladder is decompressed. Stomach/Bowel: Stomach is nondistended. No gastric wall thickening. No evidence of outlet obstruction. Duodenum is normally positioned as is the ligament of Treitz. No small bowel wall thickening. No small bowel dilatation. The terminal ileum is normal. The appendix is normal. No gross colonic mass. No colonic wall thickening. No  substantial diverticular change. Vascular/Lymphatic: No abdominal aortic aneurysm. No abdominal aortic atherosclerotic calcification. There is no gastrohepatic or hepatoduodenal ligament lymphadenopathy. No intraperitoneal or retroperitoneal lymphadenopathy. No pelvic sidewall lymphadenopathy. Reproductive: Prostate gland not well seen due to streak artifact body habitus. Other: No intraperitoneal free fluid. Musculoskeletal: Bone windows reveal no worrisome lytic or sclerotic osseous lesions. IMPRESSION: 1. No acute findings in the abdomen or pelvis. Specifically, no findings to explain the patient's history of low abdominal pain with nausea vomiting. 2. Stable appearance small kidneys bilaterally with numerous bilateral cystic renal lesions. Electronically Signed   By: Misty Stanley M.D.   On: 07/04/2017 15:18    EKG: Independently reviewed  Assessment/Plan Present on Admission: . Abdominal pain . Morbid obesity,  weight 291, BMI - 47 . Non-intractable vomiting with nausea . Essential hypertension . GERD (gastroesophageal reflux disease)  Principal Problem:   Abdominal pain Active Problems:   Morbid obesity, weight 291, BMI - 47   CKD (chronic kidney disease) stage V requiring chronic dialysis (HCC)   Non-intractable vomiting with nausea   GERD (gastroesophageal reflux disease)   Essential hypertension  #Abdominal pain Lower quadrant/epigastric pain + coffee ground vomitus + FOBT+ R/O Upper GI bleed Vs Gastroparesis VSS, No acute drop in Hgb CT abdomen/pelvis: Negative GI consulted: Will plan endoscopy 07/05/17 Keep NPO, daily CBC, IV protonix Gentle hydration with NS 75cc/hr  #Questionable Gastroparesis Normal gastric emptying study in 2016 Recent endoscopy did show retained fluid in the stomach Repeat EGD pending, if negative might consider a repeat gastric emptying study  #Gastritis/GERD (gastroesophageal reflux disease) Previous EGD in 08/2016 revealed mild gastritis,  negative for H. Pylori Continue Protonix.   #End-stage renal disease requiring chronic dialysis  MWFschedule Consult Nephrology for dialysis  #Essential hypertension Controlled  #Morbid obesity Body mass index is 50.6 kg/m.    DVT prophylaxis: SCDs  Code Status: Full  Family Communication: None at bedside   Disposition Plan: Home once stable  Consults called: GI  Admission status: Inpatient    Alma Friendly MD [facility]  If 7PM-7AM, please contact night-coverage [email or web address] Password [facility]  07/04/2017, 6:24 PM

## 2017-07-04 NOTE — ED Notes (Signed)
Placed pt on 2L of O2 for sats in the 70's pt now has sats in the 90"s

## 2017-07-04 NOTE — ED Provider Notes (Signed)
[facility] EMERGENCY DEPARTMENT Provider Note   CSN: [id number] Arrival date & time: 07/04/17  0755     History   Chief Complaint Chief Complaint  Patient presents with  . Emesis  . Abdominal Pain    HPI   Blood pressure 120/78, pulse 82, temperature 99 F (37.2 C), temperature source Oral, resp. rate 18, height 5\' 6"  (1.676 m), weight (!) 141.1 kg (311 lb 1.1 oz), SpO2 100 %.  [name] is a 31 y.o. male with past medical history significant for ESRD, chronic abdominal pain, morbid obesity complaining of 10/10 diffuse abdominal pain with 10x episodes of nonbloody, nonbilious but however, coffee-ground emesis onset last night.  He states that the pain started before the vomiting.  He denies any diarrhea, fever, sick contacts, history of liver disease, excessive NSAID use.  Fully dialyzed 2 days ago.  Been on dialysis for 14 years, ESRD secondary to hypertension.  Denies marijuana use.  Does not make urine.   Past Medical History:  Diagnosis Date  . Arthritis    "all over" (11/19/2016)  . Chronic lower back pain   . Complication of anesthesia    A little while to wake up after knee surgery in 2008  . Dizziness    when coming off of dialysis  . ESRD (end stage renal disease) on dialysis [facility])    MWF and goes to [facility] (11/19/2016)  . Family history of anesthesia complication    mom slow to wake up  . GERD (gastroesophageal reflux disease)    takes Omeprazole as needed  . ASNKNLZJ(673.4)    "monthly" (11/19/2016)  . History of hiatal hernia   . Hyperparathyroidism ([place])   . Hypertension   . Joint pain   . Joint swelling   . Morbid obesity ([place])   . PONV (postoperative nausea and vomiting)   . Renal insufficiency    Pt is on dyalisis and has been for 14 years    Patient Active Problem List   Diagnosis Date Noted  . Abdominal pain 07/04/2017  . Increased anion gap metabolic acidosis 19/37/9024  . Intractable abdominal pain   . Common  bile duct dilatation   . Cholelithiasis without cholecystitis   . Intractable nausea and vomiting 10/08/2016  . Hematemesis 08/14/2016  . GERD (gastroesophageal reflux disease) 08/14/2016  . Essential hypertension 08/14/2016  . GIB (gastrointestinal bleeding) 08/14/2016  . SIRS (systemic inflammatory response syndrome) ([place]) 08/14/2016  . CKD (chronic kidney disease) stage V requiring chronic dialysis ([place]) 06/11/2016  . Non-intractable vomiting with nausea 06/11/2016  . Hepatic steatosis 06/11/2016  . Cholelithiasis 06/11/2016  . Localized osteoarthritis of left knee 07/11/2015  . Abdominal pain, epigastric 12/05/2014  . Lapband April 2016 11/27/2014  . Arthritis of knee 03/27/2014  . Morbid obesity, weight 291, BMI - 47 02/15/2014  . Hyperparathyroidism, secondary ([place]) 03/14/2013    Past Surgical History:  Procedure Laterality Date  . AV FISTULA PLACEMENT Bilateral 2005,2007   "right; left"  . AV FISTULA REPAIR Right 2007   "tried to clean it out; couldn't do it"  . BREATH TEK H PYLORI N/A 05/15/2014   Procedure: BREATH TEK H PYLORI;  Surgeon: Alphonsa Overall, MD;  Location: [facility];  Service: General;  Laterality: N/A;  . CHOLECYSTECTOMY N/A 11/23/2016   Procedure: LAPAROSCOPIC CHOLECYSTECTOMY WITH INTRAOPERATIVE CHOLANGIOGRAM;  Surgeon: Erroll Luna, MD;  Location: [facility];  Service: General;  Laterality: N/A;  . ESOPHAGOGASTRODUODENOSCOPY N/A 12/07/2014   Procedure: ESOPHAGOGASTRODUODENOSCOPY (EGD);  Surgeon: Shanon Brow  Lucia Gaskins, MD;  Location: [facility] ENDOSCOPY;  Service: General;  Laterality: N/A;  . ESOPHAGOGASTRODUODENOSCOPY N/A 12/17/2014   Procedure: ESOPHAGOGASTRODUODENOSCOPY (EGD);  Surgeon: Alphonsa Overall, MD;  Location: [facility];  Service: General;  Laterality: N/A;  . ESOPHAGOGASTRODUODENOSCOPY N/A 08/16/2016   Procedure: ESOPHAGOGASTRODUODENOSCOPY (EGD);  Surgeon: Jerene Bears, MD;  Location: [facility];  Service: Gastroenterology;  Laterality: N/A;  . HERNIA REPAIR  11/2014    w/lap band OR  . JOINT REPLACEMENT    . KNEE ARTHROSCOPY Left 2007  . LAPAROSCOPIC GASTRIC BANDING N/A 11/12/2014   Procedure: LAPAROSCOPIC GASTRIC BANDING;  Surgeon: Alphonsa Overall, MD;  Location: [facility];  Service: General;  Laterality: N/A;  . PARATHYROIDECTOMY N/A 03/30/2013   Procedure: TOTAL PARATHYROIDECTOMY WITH AUTOTRANSPLANT;  Surgeon: Earnstine Regal, MD;  Location: [facility];  Service: General;  Laterality: N/A;  Autotransplant to left lower arm.  Marland Kitchen TOTAL KNEE ARTHROPLASTY Right 03/27/2014   Procedure: UNICOMPARTMENTAL ARTHROPLASTY;  Surgeon: Meredith Pel, MD;  Location: [place];  Service: Orthopedics;  Laterality: Right;       Home Medications    Prior to Admission medications   Medication Sig Start Date End Date Taking? Authorizing Provider  albuterol (PROVENTIL HFA;VENTOLIN HFA) 108 (90 Base) MCG/ACT inhaler Inhale 2 puffs into the lungs every 4 (four) hours as needed for wheezing or shortness of breath. 05/14/16  Yes Oxford, Orson Ape, [name]  calcium carbonate (TUMS - DOSED IN MG ELEMENTAL CALCIUM) 500 MG chewable tablet Chew 2 tablets by mouth 2 (two) times daily as needed for indigestion or heartburn.    Yes [provider]  famotidine (PEPCID) 20 MG tablet Take 1 tablet (20 mg total) by mouth daily. 01/02/17 07/04/17 Yes Duffy Bruce, MD  metoCLOPramide (REGLAN) 5 MG tablet Take 1 tablet (5 mg total) by mouth every 8 (eight) hours as needed for nausea or vomiting. 01/02/17  Yes Duffy Bruce, MD  multivitamin (RENA-VIT) TABS tablet Take 1 tablet by mouth daily.   Yes [provider]  ondansetron (ZOFRAN) 4 MG tablet Take 1 tablet (4 mg total) by mouth every 6 (six) hours. 01/16/17  Yes Montine Circle, [name]  sevelamer carbonate (RENVELA) 2.4 G PACK Take 2.4 g by mouth 3 (three) times daily with meals.    Yes [provider]  dicyclomine (BENTYL) 20 MG tablet Take 1 tablet (20 mg total) by mouth 2 (two) times daily. Patient not taking: Reported on  07/04/2017 01/16/17   Montine Circle, [name]  HYDROcodone-acetaminophen (NORCO/VICODIN) 5-325 MG tablet Take 1-2 tablets by mouth every 6 (six) hours as needed. Patient not taking: Reported on 07/04/2017 01/16/17   Montine Circle, [name]  omeprazole (PRILOSEC) 20 MG capsule Take 1 capsule (20 mg total) by mouth daily. Patient not taking: Reported on 07/04/2017 07/27/16   Ezequiel Essex, MD  prochlorperazine (COMPAZINE) 25 MG suppository Place 1 suppository (25 mg total) rectally every 12 (twelve) hours as needed for nausea or vomiting. Patient not taking: Reported on 88/09/8001 4/91/79   Delora Fuel, MD  promethazine (PHENERGAN) 25 MG suppository Place 1 suppository (25 mg total) rectally every 8 (eight) hours as needed for refractory nausea / vomiting. Patient not taking: Reported on 07/04/2017 01/02/17   Duffy Bruce, MD    Family History Family History  Problem Relation Age of Onset  . Hypertension Mother   . Diabetes Father   . Kidney Stones Sister   . Diabetes Maternal Grandmother   . Liver cancer Maternal Uncle     Social History Social History  Tobacco Use  . Smoking status: Current Every Day Smoker    Packs/day: 0.10    Years: 16.00    Pack years: 1.60    Types: Cigarettes    Last attempt to quit: 12/17/2014    Years since quitting: 2.5  . Smokeless tobacco: Never Used  Substance Use Topics  . Alcohol use: No    Alcohol/week: 0.0 oz  . Drug use: No     Allergies   Dilaudid [hydromorphone hcl]   Review of Systems Review of Systems  A complete review of systems was obtained and all systems are negative except as noted in the HPI and PMH.    Physical Exam Updated Vital Signs BP 115/79   Pulse 88   Temp 99 F (37.2 C) (Oral)   Resp 18   Ht 5\' 6"  (1.676 m)   Wt (!) 141.1 kg (311 lb 1.1 oz)   SpO2 99%   BMI 50.21 kg/m   Physical Exam  Constitutional: He is oriented to person, place, and time. He appears well-developed and well-nourished. No distress.    HENT:  Head: Normocephalic and atraumatic.  Mouth/Throat: Oropharynx is clear and moist.  Eyes: Conjunctivae and EOM are normal. Pupils are equal, round, and reactive to light.  Neck: Normal range of motion.  Cardiovascular: Normal rate, regular rhythm and intact distal pulses.  Fistula to left AC with good thrill.  Pulmonary/Chest: Effort normal and breath sounds normal. No respiratory distress.  Abdominal: Soft. There is no tenderness.  Difficult to appreciate bowel sounds, possibly secondary to habitus.  Diffusely tender to deep palpation in all quadrants with no guarding or rebound.  No focal tenderness.  Genitourinary:  Genitourinary Comments: Digital rectal exam is chaperoned by Darryl technician, normal rectal tone, normal stool color  Musculoskeletal: Normal range of motion.  Neurological: He is alert and oriented to person, place, and time.  Skin: He is not diaphoretic.  Psychiatric: He has a normal mood and affect.  Nursing note and vitals reviewed.    ED Treatments / Results  Labs (all labs ordered are listed, but only abnormal results are displayed) Labs Reviewed  COMPREHENSIVE METABOLIC PANEL - Abnormal; Notable for the following components:      Result Value   Sodium 134 (*)    Chloride 90 (*)    CO2 21 (*)    Glucose, Bld 125 (*)    BUN 44 (*)    Creatinine, Ser 12.82 (*)    Calcium 8.1 (*)    Total Protein 9.1 (*)    GFR calc non Af Amer 4 (*)    GFR calc Af Amer 5 (*)    Anion gap 23 (*)    All other components within normal limits  CBC - Abnormal; Notable for the following components:   WBC 12.1 (*)    All other components within normal limits  POC OCCULT BLOOD, ED - Abnormal; Notable for the following components:   Fecal Occult Bld POSITIVE (*)    All other components within normal limits  LIPASE, BLOOD  URINALYSIS, ROUTINE W REFLEX MICROSCOPIC    EKG  EKG Interpretation None       Radiology No results found.  Procedures Procedures  (including critical care time)  Medications Ordered in ED Medications  0.9 %  sodium chloride infusion ( Intravenous New Bag/Given 07/04/17 0827)  pantoprazole (PROTONIX) 80 mg in sodium chloride 0.9 % 100 mL IVPB (80 mg Intravenous New Bag/Given 07/04/17 1359)  ondansetron (ZOFRAN) injection 4 mg (4  mg Intravenous Given 07/04/17 0823)  famotidine (PEPCID) IVPB 20 mg premix (0 mg Intravenous Stopped 07/04/17 0854)  morphine 4 MG/ML injection 4 mg (4 mg Intravenous Given 07/04/17 0824)  dicyclomine (BENTYL) injection 20 mg (20 mg Intramuscular Given 07/04/17 1304)  capsaicin (ZOSTRIX) 0.025 % cream ( Topical Given 07/04/17 1304)  metoCLOPramide (REGLAN) injection 10 mg (10 mg Intravenous Given 07/04/17 1355)  haloperidol lactate (HALDOL) injection 2 mg (2 mg Intravenous Given 07/04/17 1355)     Initial Impression / Assessment and Plan / ED Course  I have reviewed the triage vital signs and the nursing notes.  Pertinent labs & imaging results that were available during my care of the patient were reviewed by me and considered in my medical decision making (see chart for details).     Vitals:   07/04/17 1000 07/04/17 1100 07/04/17 1230 07/04/17 1351  BP: (!) 134/94 132/84  115/79  Pulse: 87 81 86 88  Resp:      Temp:      TempSrc:      SpO2: 97% 100% 100% 99%  Weight:      Height:        Medications  0.9 %  sodium chloride infusion ( Intravenous New Bag/Given 07/04/17 0827)  pantoprazole (PROTONIX) 80 mg in sodium chloride 0.9 % 100 mL IVPB (80 mg Intravenous New Bag/Given 07/04/17 1359)  ondansetron (ZOFRAN) injection 4 mg (4 mg Intravenous Given 07/04/17 0823)  famotidine (PEPCID) IVPB 20 mg premix (0 mg Intravenous Stopped 07/04/17 0854)  morphine 4 MG/ML injection 4 mg (4 mg Intravenous Given 07/04/17 0824)  dicyclomine (BENTYL) injection 20 mg (20 mg Intramuscular Given 07/04/17 1304)  capsaicin (ZOSTRIX) 0.025 % cream ( Topical Given 07/04/17 1304)  metoCLOPramide (REGLAN) injection 10  mg (10 mg Intravenous Given 07/04/17 1355)  haloperidol lactate (HALDOL) injection 2 mg (2 mg Intravenous Given 07/04/17 1355)    Gayle Collard Swanton is 31 y.o. male presenting with severe abdominal pain and multiple episodes of what he describes as coffee-ground emesis.  Vital signs stable, abdominal exam nonsurgical.  Patient gently hydrated as he is dialysis patient, morphine administered.  He has a adverse reaction to Dilaudid that is likely a combination of hypercapnia from the obesity in combination with narcotics.  We will give a small dose initially based on the severity of his pain.  Pepcid also.   Patient again became hypoxic after small dose of morphine.  He continues to report severe pain.  Will give capsaicin, Haldol and Protonix for presumed upper GI bleed.  Rectal exam was grossly normal stool color however it is guaiac positive.  He has a significantly elevated anion gap at 23, BUN is also elevated at 44.  Chart review shows that this patient was seen by [facility] on the floor at the beginning of the year.  He had a endoscopy showing gastritis.  Patient given Pepcid and 80 mg of Protonix, gently hydrated.  Admitted to [facility] hospitalist.  Gastroenterology consult from Dr. Fuller Plan, there are service will evaluate him.   Final Clinical Impressions(s) / ED Diagnoses   Final diagnoses:  Nausea and vomiting, intractability of vomiting not specified, unspecified vomiting type  Increased anion gap metabolic acidosis  Positive fecal occult blood test    ED Discharge Orders    None       Waynetta Pean 07/04/17 1412    Pattricia Boss, MD 07/04/17 [phone]

## 2017-07-04 NOTE — Plan of Care (Signed)
  Clinical Measurements:  Patient is very tired, he is not cooperating with answering questions or enabling education. Will continue to reassess.  Ability to maintain clinical measurements within normal limits will improve 07/04/2017 1723 - Not Progressing by Ames Dura, RN Will remain free from infection 07/04/2017 1723 - Not Progressing by Ames Dura, RN Diagnostic test results will improve 07/04/2017 1723 - Not Progressing by Ames Dura, RN Respiratory complications will improve 07/04/2017 1723 - Not Progressing by Ames Dura, RN Cardiovascular complication will be avoided 07/04/2017 1723 - Not Progressing by Ames Dura, RN   Activity: Risk for activity intolerance will decrease 07/04/2017 1723 - Not Progressing by Ames Dura, RN   Nutrition: Adequate nutrition will be maintained 07/04/2017 1723 - Not Progressing by Ames Dura, RN   Safety: Ability to remain free from injury will improve 07/04/2017 1723 - Not Progressing by Ames Dura, RN

## 2017-07-05 ENCOUNTER — Inpatient Hospital Stay (HOSPITAL_COMMUNITY): Payer: Medicare Other | Admitting: Certified Registered"

## 2017-07-05 ENCOUNTER — Encounter (HOSPITAL_COMMUNITY)
Admission: EM | Disposition: A | Discharge status: Home / Self Care | Payer: Self-pay | Source: Home / Self Care | Attending: Internal Medicine

## 2017-07-05 ENCOUNTER — Encounter (HOSPITAL_COMMUNITY): Payer: Self-pay | Admitting: *Deleted

## 2017-07-05 DIAGNOSIS — K2990 Gastroduodenitis, unspecified, without bleeding: Secondary | ICD-10-CM | POA: Diagnosis not present

## 2017-07-05 DIAGNOSIS — N186 End stage renal disease: Secondary | ICD-10-CM | POA: Diagnosis not present

## 2017-07-05 DIAGNOSIS — N2581 Secondary hyperparathyroidism of renal origin: Secondary | ICD-10-CM | POA: Diagnosis not present

## 2017-07-05 DIAGNOSIS — Z6841 Body Mass Index (BMI) 40.0 and over, adult: Secondary | ICD-10-CM | POA: Diagnosis not present

## 2017-07-05 DIAGNOSIS — K2970 Gastritis, unspecified, without bleeding: Secondary | ICD-10-CM

## 2017-07-05 DIAGNOSIS — Z992 Dependence on renal dialysis: Secondary | ICD-10-CM | POA: Diagnosis not present

## 2017-07-05 DIAGNOSIS — R112 Nausea with vomiting, unspecified: Secondary | ICD-10-CM | POA: Diagnosis not present

## 2017-07-05 DIAGNOSIS — K920 Hematemesis: Secondary | ICD-10-CM | POA: Diagnosis not present

## 2017-07-05 DIAGNOSIS — K219 Gastro-esophageal reflux disease without esophagitis: Secondary | ICD-10-CM | POA: Diagnosis not present

## 2017-07-05 DIAGNOSIS — R195 Other fecal abnormalities: Secondary | ICD-10-CM | POA: Diagnosis not present

## 2017-07-05 DIAGNOSIS — I10 Essential (primary) hypertension: Secondary | ICD-10-CM | POA: Diagnosis not present

## 2017-07-05 HISTORY — PX: ESOPHAGOGASTRODUODENOSCOPY (EGD) WITH PROPOFOL: SHX5813

## 2017-07-05 LAB — CBC WITH DIFFERENTIAL/PLATELET
BASOS PCT: 1 %
Basophils Absolute: 0.1 10*3/uL (ref 0.0–0.1)
EOS ABS: 0.1 10*3/uL (ref 0.0–0.7)
EOS PCT: 1 %
HCT: 36.5 % — ABNORMAL LOW (ref 39.0–52.0)
Hemoglobin: 12.1 g/dL — ABNORMAL LOW (ref 13.0–17.0)
LYMPHS ABS: 4.1 10*3/uL — AB (ref 0.7–4.0)
Lymphocytes Relative: 32 %
MCH: 29 pg (ref 26.0–34.0)
MCHC: 33.2 g/dL (ref 30.0–36.0)
MCV: 87.5 fL (ref 78.0–100.0)
MONO ABS: 1.3 10*3/uL — AB (ref 0.1–1.0)
MONOS PCT: 10 %
NEUTROS PCT: 56 %
Neutro Abs: 7.2 10*3/uL (ref 1.7–7.7)
PLATELETS: 214 10*3/uL (ref 150–400)
RBC: 4.17 MIL/uL — ABNORMAL LOW (ref 4.22–5.81)
RDW: 15.5 % (ref 11.5–15.5)
WBC: 12.8 10*3/uL — ABNORMAL HIGH (ref 4.0–10.5)

## 2017-07-05 LAB — BASIC METABOLIC PANEL
Anion gap: 21 — ABNORMAL HIGH (ref 5–15)
BUN: 61 mg/dL — AB (ref 6–20)
CALCIUM: 7.2 mg/dL — AB (ref 8.9–10.3)
CHLORIDE: 93 mmol/L — AB (ref 101–111)
CO2: 22 mmol/L (ref 22–32)
CREATININE: 15.34 mg/dL — AB (ref 0.61–1.24)
GFR calc non Af Amer: 4 mL/min — ABNORMAL LOW (ref >60)
GFR, EST AFRICAN AMERICAN: 4 mL/min — AB (ref >60)
Glucose, Bld: 92 mg/dL (ref 65–99)
Potassium: 3.7 mmol/L (ref 3.5–5.1)
SODIUM: 136 mmol/L (ref 135–145)

## 2017-07-05 LAB — GLUCOSE, CAPILLARY: Glucose-Capillary: 103 mg/dL — ABNORMAL HIGH (ref 65–99)

## 2017-07-05 LAB — MRSA PCR SCREENING: MRSA by PCR: NEGATIVE

## 2017-07-05 SURGERY — ESOPHAGOGASTRODUODENOSCOPY (EGD) WITH PROPOFOL
Anesthesia: Monitor Anesthesia Care

## 2017-07-05 MED ORDER — LIDOCAINE HCL (CARDIAC) 20 MG/ML IV SOLN
INTRAVENOUS | Status: DC | PRN
  Administered 2017-07-05: 100 mg via INTRATRACHEAL

## 2017-07-05 MED ORDER — FAMOTIDINE 10 MG PO TABS: 10.0000 mg | ORAL_TABLET | Freq: Every day | ORAL | 0 refills | Status: DC

## 2017-07-05 MED ORDER — PROPOFOL 500 MG/50ML IV EMUL
INTRAVENOUS | Status: DC | PRN
  Administered 2017-07-05: 75 ug/kg/min via INTRAVENOUS

## 2017-07-05 MED ORDER — METOCLOPRAMIDE HCL 10 MG PO TABS: 10.0000 mg | ORAL_TABLET | Freq: Every day | ORAL | Status: DC

## 2017-07-05 MED ORDER — PANTOPRAZOLE SODIUM 40 MG PO TBEC: 40.0000 mg | DELAYED_RELEASE_TABLET | Freq: Every day | ORAL | 0 refills | Status: DC

## 2017-07-05 MED ORDER — FAMOTIDINE 20 MG PO TABS: 10.0000 mg | ORAL_TABLET | Freq: Every day | ORAL | Status: DC

## 2017-07-05 MED ORDER — METOCLOPRAMIDE HCL 10 MG PO TABS: 10.0000 mg | ORAL_TABLET | Freq: Every day | ORAL | 0 refills | Status: DC

## 2017-07-05 MED ORDER — ONDANSETRON HCL 4 MG PO TABS
4.0000 mg | ORAL_TABLET | Freq: Two times a day (BID) | ORAL | Status: DC
  Administered 2017-07-05: 4 mg via ORAL
  Filled 2017-07-05: qty 1

## 2017-07-05 MED ORDER — SODIUM CHLORIDE 0.9 % IV SOLN
INTRAVENOUS | Status: DC | PRN
  Administered 2017-07-05: 09:00:00 via INTRAVENOUS

## 2017-07-05 MED ORDER — PHENYLEPHRINE HCL 10 MG/ML IJ SOLN
INTRAMUSCULAR | Status: DC | PRN
  Administered 2017-07-05 (×4): 200 ug via INTRAVENOUS

## 2017-07-05 MED ORDER — ONDANSETRON HCL 4 MG PO TABS: 4.0000 mg | ORAL_TABLET | Freq: Two times a day (BID) | ORAL | 0 refills | Status: DC

## 2017-07-05 MED ORDER — BUTAMBEN-TETRACAINE-BENZOCAINE 2-2-14 % EX AERO
INHALATION_SPRAY | CUTANEOUS | Status: DC | PRN
  Administered 2017-07-05: 1 via TOPICAL

## 2017-07-05 MED ORDER — PANTOPRAZOLE SODIUM 40 MG PO TBEC: 40.0000 mg | DELAYED_RELEASE_TABLET | Freq: Every day | ORAL | Status: DC

## 2017-07-05 MED ORDER — ALBUMIN HUMAN 5 % IV SOLN
INTRAVENOUS | Status: DC | PRN
  Administered 2017-07-05: 10:00:00 via INTRAVENOUS

## 2017-07-05 MED ORDER — PROPOFOL 10 MG/ML IV BOLUS
INTRAVENOUS | Status: DC | PRN
  Administered 2017-07-05: 70 mg via INTRAVENOUS

## 2017-07-05 SURGICAL SUPPLY — 14 items

## 2017-07-05 NOTE — Discharge Summary (Signed)
Discharge Summary  [name]:[id number] DOB: 06-26-1986  PCP: Nolene Ebbs, MD  Admit date: 07/04/2017 Discharge date: 07/05/2017  Time spent: <30 mins  Recommendations for Outpatient Follow-up:  1. PCP 2. GI  Discharge Diagnoses:  Active Hospital Problems   Diagnosis Date Noted  . Abdominal pain 07/04/2017  . Gastritis and gastroduodenitis 11/19/2016  . Essential hypertension 08/14/2016  . GERD (gastroesophageal reflux disease) 08/14/2016  . CKD (chronic kidney disease) stage V requiring chronic dialysis ([facility]) 06/11/2016  . Non-intractable vomiting with nausea 06/11/2016  . Morbid obesity, weight 291, BMI - 47 02/15/2014    Resolved Hospital Problems  No resolved problems to display.    Discharge Condition: Stable  Diet recommendation: Heart healthy  Vitals:   07/05/17 1010 07/05/17 1051  BP:  96/60  Pulse: 75 71  Resp: 18 17  Temp:  97.8 F (36.6 C)  SpO2: 97% 98%    History of present illness:  [name] is a 31 y.o. male with medical history significant for ESRD on HD, GERD, gastroparesis, HTN, morbid obesity presents with c/o abdominal pain, N/V for 1 day. Pt reported pain started across the lower quadrant of his abdomen, currently worse now around the epigastric region. Pain is sharp, constant, no radiation, tried pepto-bismol at home, but vomited. Nothing makes it better. Associated with nausea and had episodes of coffee ground emesis of which prompted him to go to the ER. Pt denied any diarrhea, dark tarry stools/melena, fever/chills, sick contacts, chest pain, SOB, cough, any marijuana/alcohol abuse  Of note, pt has had multiple visit for similar issue of which extensive work up has been done this past year. Pt has had gastric banding before, but was reversed. Gastroparesis was suspected, a gastric emptying study done in 2016 was negative, but a recent endoscopy done in 08/2016 showed retained fluid in stomach and mild gastritis. H. pylori was  negative. Pt was also admitted in 4/18 for symptomatic cholelithiasis of which lap chole was done. In the ED, pt was treated symptomatically. Labs fairly stable except for creatinine (ESRD on dialysis). CT abdomen was negative. Pt admitted for possible UGI bleed, with consult to GI  Today, pt reported abdominal/epigastric pain has resolved, denied any further nausea/coffee ground emesis. Denied any chest pain, SOB, dizziness, fever/chills.  Hospital Course:  Principal Problem:   Abdominal pain Active Problems:   Morbid obesity, weight 291, BMI - 47   CKD (chronic kidney disease) stage V requiring chronic dialysis ([facility])   Non-intractable vomiting with nausea   GERD (gastroesophageal reflux disease)   Essential hypertension   Gastritis and gastroduodenitis  #Mild non-specific distal gastritis/very mild bulbar duodenitis Lower quadrant/epigastric pain + coffee ground vomitus + FOBT+ VSS, No acute drop in Hgb CT abdomen/pelvis: Negative GI consulted: Endoscopy done on 07/05/17 showed mild non-specific distal gastritis/very mild bulbar duodenitis. Stomach was biopsied for H.pylori, follow up with GI as outpt  Unlikely patient symptoms are due to the above findings GI strongly recommends PO protonix before breakfast, Famotidine at bedtime Weight loss strongly advised  #Questionable Gastroparesis Normal gastric emptying study in 2016 EGD report as above GI recommends: Zofran 4mg  BID and PO reglan at bedtime.  #Gastritis/GERD (gastroesophageal reflux disease) EGD report + management as above  #End-stage renal disease requiring chronic dialysis  MWFschedule Due to high vol of scheduled dialysis today, an outpt schedule was made for patient at his dialysis center for 3pm. Pt confirmed he will make his scheduled dialysis  #Essential hypertension Controlled  #Morbid obesity  Body mass index is 50.6 kg/m Weight loss strongly encouraged    Procedures:  EGD on  07/05/17  Consultations:  GI  Discharge Exam: BP 96/60 (BP Location: Right Arm)   Pulse 71   Temp 97.8 F (36.6 C) (Oral)   Resp 17   Ht 5\' 6"  (1.676 m)   Wt (!) 140 kg (308 lb 9.6 oz)   SpO2 98%   BMI 49.81 kg/m   General: AAO X3, not in acute distress Cardiovascular: S1-S2 present, no added hrt sound Respiratory: Chest clear bilaterally   Discharge Instructions You were cared for by a hospitalist during your hospital stay. If you have any questions about your discharge medications or the care you received while you were in the hospital after you are discharged, you can call the unit and asked to speak with the hospitalist on call if the hospitalist that took care of you is not available. Once you are discharged, your primary care physician will handle any further medical issues. Please note that NO REFILLS for any discharge medications will be authorized once you are discharged, as it is imperative that you return to your primary care physician (or establish a relationship with a primary care physician if you do not have one) for your aftercare needs so that they can reassess your need for medications and monitor your lab values.  Discharge Instructions    Diet - low sodium heart healthy   Complete by:  As directed    Increase activity slowly   Complete by:  As directed      Allergies as of 07/05/2017      Reactions   Dilaudid [hydromorphone Hcl] Other (See Comments)   Patient became hypoxic to 60% with dilaudid 1mg  IV      Medication List    STOP taking these medications   dicyclomine 20 MG tablet Commonly known as:  BENTYL   HYDROcodone-acetaminophen 5-325 MG tablet Commonly known as:  NORCO/VICODIN   omeprazole 20 MG capsule Commonly known as:  PRILOSEC   prochlorperazine 25 MG suppository Commonly known as:  COMPAZINE   promethazine 25 MG suppository Commonly known as:  PHENERGAN     TAKE these medications   albuterol 108 (90 Base) MCG/ACT  inhaler Commonly known as:  PROVENTIL HFA;VENTOLIN HFA Inhale 2 puffs into the lungs every 4 (four) hours as needed for wheezing or shortness of breath.   calcium carbonate 500 MG chewable tablet Commonly known as:  TUMS - dosed in mg elemental calcium Chew 2 tablets by mouth 2 (two) times daily as needed for indigestion or heartburn.   famotidine 10 MG tablet Commonly known as:  PEPCID Take 1 tablet (10 mg total) by mouth at bedtime. What changed:    medication strength  how much to take  when to take this   metoCLOPramide 10 MG tablet Commonly known as:  REGLAN Take 1 tablet (10 mg total) by mouth at bedtime. What changed:    medication strength  how much to take  when to take this  reasons to take this   multivitamin Tabs tablet Take 1 tablet by mouth daily.   ondansetron 4 MG tablet Commonly known as:  ZOFRAN Take 1 tablet (4 mg total) by mouth every 12 (twelve) hours. What changed:  when to take this   pantoprazole 40 MG tablet Commonly known as:  PROTONIX Take 1 tablet (40 mg total) by mouth daily before breakfast. Start taking on:  07/06/2017   sevelamer carbonate 2.4 g Pack Commonly  known as:  RENVELA Take 2.4 g by mouth 3 (three) times daily with meals.      Allergies  Allergen Reactions  . Dilaudid [Hydromorphone Hcl] Other (See Comments)    Patient became hypoxic to 60% with dilaudid 1mg  IV   Follow-up Information    Nolene Ebbs, MD. Schedule an appointment as soon as possible for a visit in 1 week(s).   Specialty:  Internal Medicine Contact information: [address] [place] [address] [phone]        Doran Stabler, MD. Schedule an appointment as soon as possible for a visit in 4 week(s).   Specialty:  Gastroenterology Contact information: [address] [place] [address] [phone]            The results of significant diagnostics from this hospitalization (including imaging, microbiology,  ancillary and laboratory) are listed below for reference.    Significant Diagnostic Studies: Ct Abdomen Pelvis Wo Contrast  Result Date: 07/04/2017 CLINICAL DATA:  Low abdominal pain with nausea vomiting since yesterday. EXAM: CT ABDOMEN AND PELVIS WITHOUT CONTRAST TECHNIQUE: Multidetector CT imaging of the abdomen and pelvis was performed following the standard protocol without IV contrast. COMPARISON:  12/05/2016 FINDINGS: Lower chest: Basilar atelectasis noted bilaterally. 3 mm right lower lobe pulmonary nodule stable in the interval and previously characterized as benign. Hepatobiliary: No focal abnormality in the liver on this study without intravenous contrast. Gallbladder surgically absent. No intrahepatic or extrahepatic biliary dilation. Pancreas: No focal mass lesion. No dilatation of the main duct. No intraparenchymal cyst. No peripancreatic edema. Spleen: No splenomegaly. No focal mass lesion. Adrenals/Urinary Tract: No adrenal nodule or mass. Multiple cystic lesions identified in each kidney, incompletely characterize on this noncontrast exam but similar to prior study. No evidence for hydroureter. Bladder is decompressed. Stomach/Bowel: Stomach is nondistended. No gastric wall thickening. No evidence of outlet obstruction. Duodenum is normally positioned as is the ligament of Treitz. No small bowel wall thickening. No small bowel dilatation. The terminal ileum is normal. The appendix is normal. No gross colonic mass. No colonic wall thickening. No substantial diverticular change. Vascular/Lymphatic: No abdominal aortic aneurysm. No abdominal aortic atherosclerotic calcification. There is no gastrohepatic or hepatoduodenal ligament lymphadenopathy. No intraperitoneal or retroperitoneal lymphadenopathy. No pelvic sidewall lymphadenopathy. Reproductive: Prostate gland not well seen due to streak artifact body habitus. Other: No intraperitoneal free fluid. Musculoskeletal: Bone windows reveal no  worrisome lytic or sclerotic osseous lesions. IMPRESSION: 1. No acute findings in the abdomen or pelvis. Specifically, no findings to explain the patient's history of low abdominal pain with nausea vomiting. 2. Stable appearance small kidneys bilaterally with numerous bilateral cystic renal lesions. Electronically Signed   By: Misty Stanley M.D.   On: 07/04/2017 15:18    Microbiology: Recent Results (from the past 240 hour(s))  MRSA PCR Screening     Status: None   Collection Time: 07/05/17 11:00 AM  Result Value Ref Range Status   MRSA by PCR NEGATIVE NEGATIVE Final    Comment:        The GeneXpert MRSA Assay (FDA approved for NASAL specimens only), is one component of a comprehensive MRSA colonization surveillance program. It is not intended to diagnose MRSA infection nor to guide or monitor treatment for MRSA infections.      Labs: Basic Metabolic Panel: Recent Labs  Lab 07/04/17 0810 07/05/17 0628  NA 134* 136  K 3.5 3.7  CL 90* 93*  CO2 21* 22  GLUCOSE 125* 92  BUN 44*  61*  CREATININE 12.82* 15.34*  CALCIUM 8.1* 7.2*   Liver Function Tests: Recent Labs  Lab 07/04/17 0810  AST 21  ALT 20  ALKPHOS 82  BILITOT 1.1  PROT 9.1*  ALBUMIN 4.4   Recent Labs  Lab 07/04/17 0810  LIPASE 28   No results for input(s): AMMONIA in the last 168 hours. CBC: Recent Labs  Lab 07/04/17 0810 07/05/17 0628  WBC 12.1* 12.8*  NEUTROABS  --  7.2  HGB 13.9 12.1*  HCT 40.3 36.5*  MCV 85.0 87.5  PLT 212 214   Cardiac Enzymes: No results for input(s): CKTOTAL, CKMB, CKMBINDEX, TROPONINI in the last 168 hours. BNP: BNP (last 3 results) No results for input(s): BNP in the last 8760 hours.  ProBNP (last 3 results) No results for input(s): PROBNP in the last 8760 hours.  CBG: Recent Labs  Lab 07/05/17 0757  GLUCAP 103*       Signed:  Alma Friendly, MD [facility] 07/05/2017, 6:03 PM

## 2017-07-05 NOTE — Anesthesia Preprocedure Evaluation (Signed)
Anesthesia Evaluation  Patient identified by MRN, date of birth, ID band Patient awake    Reviewed: Allergy & Precautions, NPO status , Patient's Chart, lab work & pertinent test results  History of Anesthesia Complications (+) PONV, Family history of anesthesia reaction and history of anesthetic complications  Airway Mallampati: II  TM Distance: <3 FB Neck ROM: Full    Dental no notable dental hx.    Pulmonary neg pulmonary ROS, Current Smoker, former smoker,    breath sounds clear to auscultation + decreased breath sounds      Cardiovascular hypertension, Normal cardiovascular exam Rhythm:Regular Rate:Normal     Neuro/Psych negative neurological ROS  negative psych ROS   GI/Hepatic Neg liver ROS, GERD  ,  Endo/Other  negative endocrine ROS  Renal/GU DialysisRenal disease  negative genitourinary   Musculoskeletal negative musculoskeletal ROS (+)   Abdominal   Peds negative pediatric ROS (+)  Hematology  (+) anemia ,   Anesthesia Other Findings   Reproductive/Obstetrics negative OB ROS                             Anesthesia Physical  Anesthesia Plan  ASA: III  Anesthesia Plan: MAC   Post-op Pain Management:    Induction: Intravenous  PONV Risk Score and Plan: 1  Airway Management Planned: Nasal Cannula  Additional Equipment:   Intra-op Plan:   Post-operative Plan:   Informed Consent: I have reviewed the patients History and Physical, chart, labs and discussed the procedure including the risks, benefits and alternatives for the proposed anesthesia with the patient or authorized representative who has indicated his/her understanding and acceptance.   Dental advisory given  Plan Discussed with: CRNA and Surgeon  Anesthesia Plan Comments:         Anesthesia Quick Evaluation

## 2017-07-05 NOTE — Op Note (Signed)
[facility] Patient Name: [name] Procedure Date : 07/05/2017 MRN: [id number] Attending MD: [name] , MD Date of Birth: Jan 04, 1986 CSN: [id number] Age: 31 Admit Type: Inpatient Procedure:                Upper GI endoscopy Indications:              Generalized abdominal pain, Coffee-ground emesis,                            Heme positive stool Providers:                Alex Banister, MD, Alex Daub, RN, [name] Tech., Technician, Alex Coon, CRNA Referring MD:              Medicines:                Monitored Anesthesia Care Complications:            No immediate complications. Estimated blood loss:                            None. Estimated Blood Loss:     Estimated blood loss: none. Procedure:                Pre-Anesthesia Assessment:                           - Prior to the procedure, a History and Physical                            was performed, and patient medications and                            allergies were reviewed. The patient's tolerance of                            previous anesthesia was also reviewed. The risks                            and benefits of the procedure and the sedation                            options and risks were discussed with the patient.                            All questions were answered, and informed consent                            was obtained. Prior Anticoagulants: The patient has                            taken no previous anticoagulant or antiplatelet  agents. ASA Grade Assessment: IV - A patient with                            severe systemic disease that is a constant threat                            to life. After reviewing the risks and benefits,                            the patient was deemed in satisfactory condition to                            undergo the procedure.                           After obtaining informed  consent, the endoscope was                            passed under direct vision. Throughout the                            procedure, the patient's blood pressure, pulse, and                            oxygen saturations were monitored continuously. The                            [id number] ([id number]) scope was introduced through the                            mouth, and advanced to the second part of duodenum.                            The upper GI endoscopy was accomplished without                            difficulty. The patient tolerated the procedure                            well. Scope In: Scope Out: Findings:      The esophagus was normal.      Mild inflammation characterized by erythema and friability was found in       the gastric antrum. Biopsies were taken with a cold forceps for       histology.      Very mild bulbar duodenitis.. Impression:               - Non-specific distal gastritis and very mild                            bulbar duodenitis. The stomach was biopsied to                            check for H. pylori.                           -  It is doubtful that these findings are causing                            his chronic intermittent symptoms which have been                            extensively tested over the past 1-2 years                            (multiple CT, [place], EGD, gastric emptying, elective                            lap chole). Moderate Sedation:      none Recommendation:           - Return patient to hospital ward for ongoing care.                           - Advance diet as tolerated.                           - OK to advance diet and observe him clinically.                           - Should treat him symptomatically but certainly                            would avoid narcotic pain medicines as they can                            worsen GI function. He should be on PPI                            prescription strength every day shortly before                             breakfast. Would also start H2 blocker at bedtime                            (such as ranitidine 150mg ). I also recommend                            antinausea medicines be taken on a daily, scheduled                            basis (zofran 4mg  bid) and will order all those.                           - Losing weight may improve the abdominal pains.                           - No further GI testing is planned. Follow up as  needed with Dr. Loletha Macias. Procedure Code(s):        --- Professional ---                           [phone], Esophagogastroduodenoscopy, flexible,                            transoral; with biopsy, single or multiple Diagnosis Code(s):        --- Professional ---                           K29.70, Gastritis, unspecified, without bleeding                           R10.84, Generalized abdominal pain                           K92.0, Hematemesis                           R19.5, Other fecal abnormalities CPT copyright 2016 American Medical Association. All rights reserved. The codes documented in this report are preliminary and upon coder review may  be revised to meet current compliance requirements. Alex Banister, MD 07/05/2017 9:36:53 AM This report has been signed electronically. Number of Addenda: 0

## 2017-07-05 NOTE — Progress Notes (Signed)
Pt discharge to home, discharge instructions, medications/prescriptions and f/u appt discussed and reviewed with pt, verbalized understanding. Telemetry dc'ed, CCMD notified. IV discontinued, site clean and dry. Pt was escorted out of the unit in wheelchair, took all belongings with him.

## 2017-07-05 NOTE — Anesthesia Procedure Notes (Signed)
Procedure Name: MAC Date/Time: 07/05/2017 9:11 AM Performed by: Lance Coon, CRNA Pre-anesthesia Checklist: Patient identified, Emergency Drugs available, Suction available, Patient being monitored and Timeout performed Patient Re-evaluated:Patient Re-evaluated prior to induction Oxygen Delivery Method: Nasal cannula

## 2017-07-05 NOTE — Transfer of Care (Signed)
Immediate Anesthesia Transfer of Care Note  Patient: [name]  Procedure(s) Performed: ESOPHAGOGASTRODUODENOSCOPY (EGD) WITH PROPOFOL (N/A )  Patient Location: Endoscopy Unit  Anesthesia Type:MAC  Level of Consciousness: awake and patient cooperative  Airway & Oxygen Therapy: Patient Spontanous Breathing  Post-op Assessment: Report given to RN and Post -op Vital signs reviewed and stable  Post vital signs: Reviewed and stable  Last Vitals:  Vitals:   07/05/17 0839 07/05/17 0934  BP: (!) 104/53   Pulse: 75   Resp: 13 10  Temp: 36.6 C 37.1 C  SpO2: 95%     Last Pain:  Vitals:   07/05/17 0934  TempSrc: Oral  PainSc:          Complications: No apparent anesthesia complications

## 2017-07-05 NOTE — Interval H&P Note (Signed)
History and Physical Interval Note:  07/05/2017 8:54 AM  [name]  has presented today for surgery, with the diagnosis of coffee ground  The various methods of treatment have been discussed with the patient and family. After consideration of risks, benefits and other options for treatment, the patient has consented to  Procedure(s): ESOPHAGOGASTRODUODENOSCOPY (EGD) WITH PROPOFOL (N/A) as a surgical intervention .  The patient's history has been reviewed, patient examined, no change in status, stable for surgery.  I have reviewed the patient's chart and labs.  Questions were answered to the patient's satisfaction.     Milus Banister

## 2017-07-05 NOTE — Anesthesia Postprocedure Evaluation (Signed)
Anesthesia Post Note  Patient: [name]  Procedure(s) Performed: ESOPHAGOGASTRODUODENOSCOPY (EGD) WITH PROPOFOL (N/A )     Patient location during evaluation: PACU Anesthesia Type: MAC Level of consciousness: awake and alert Pain management: pain level controlled Vital Signs Assessment: post-procedure vital signs reviewed and stable Respiratory status: spontaneous breathing, nonlabored ventilation, respiratory function stable and patient connected to nasal cannula oxygen Cardiovascular status: stable and blood pressure returned to baseline Postop Assessment: no apparent nausea or vomiting Anesthetic complications: no    Last Vitals:  Vitals:   07/05/17 1005 07/05/17 1010  BP: 108/61   Pulse: 79 75  Resp: 14 18  Temp:    SpO2: 97% 97%    Last Pain:  Vitals:   07/05/17 0934  TempSrc: Oral  PainSc:                  [name]

## 2017-07-05 NOTE — Anesthesia Preprocedure Evaluation (Deleted)
Anesthesia Evaluation  Patient identified by MRN, date of birth, ID band Patient awake    Reviewed: Allergy & Precautions, NPO status , Patient's Chart, lab work & pertinent test results  Airway Mallampati: II  TM Distance: <3 FB Neck ROM: Full    Dental no notable dental hx.    Pulmonary neg pulmonary ROS, Current Smoker, former smoker,    breath sounds clear to auscultation + decreased breath sounds      Cardiovascular hypertension, Normal cardiovascular exam Rhythm:Regular Rate:Normal     Neuro/Psych negative neurological ROS  negative psych ROS   GI/Hepatic Neg liver ROS, GERD  Medicated,  Endo/Other  negative endocrine ROS  Renal/GU DialysisRenal disease  negative genitourinary   Musculoskeletal negative musculoskeletal ROS (+)   Abdominal   Peds negative pediatric ROS (+)  Hematology  (+) anemia ,   Anesthesia Other Findings   Reproductive/Obstetrics negative OB ROS                                                              Anesthesia Evaluation  Patient identified by MRN, date of birth, ID band Patient awake    Reviewed: Allergy & Precautions, NPO status , Patient's Chart, lab work & pertinent test results, reviewed documented beta blocker date and time   History of Anesthesia Complications (+) PONV and history of anesthetic complications  Airway Mallampati: II  TM Distance: >3 FB     Dental  (+) Teeth Intact, Dental Advisory Given   Pulmonary Current Smoker,    breath sounds clear to auscultation       Cardiovascular hypertension, Pt. on medications  Rhythm:Regular Rate:Normal     Neuro/Psych  Headaches,    GI/Hepatic hiatal hernia, GERD  ,  Endo/Other  Morbid obesity  Renal/GU Dialysis and ESRFRenal disease     Musculoskeletal  (+) Arthritis ,   Abdominal   Peds  Hematology   Anesthesia Other Findings   Reproductive/Obstetrics                             Anesthesia Physical Anesthesia Plan  ASA: III  Anesthesia Plan: General   Post-op Pain Management:    Induction: Intravenous, Rapid sequence and Cricoid pressure planned  Airway Management Planned: Oral ETT  Additional Equipment:   Intra-op Plan:   Post-operative Plan: Extubation in OR  Informed Consent: I have reviewed the patients History and Physical, chart, labs and discussed the procedure including the risks, benefits and alternatives for the proposed anesthesia with the patient or authorized representative who has indicated his/her understanding and acceptance.   Dental advisory given  Plan Discussed with: CRNA and Anesthesiologist  Anesthesia Plan Comments:        Anesthesia Quick Evaluation  Anesthesia Physical  Anesthesia Plan  ASA: IV  Anesthesia Plan: MAC   Post-op Pain Management:    Induction: Intravenous  PONV Risk Score and Plan:   Airway Management Planned: Nasal Cannula  Additional Equipment:   Intra-op Plan:   Post-operative Plan:   Informed Consent: I have reviewed the patients History and Physical, chart, labs and discussed the procedure including the risks, benefits and alternatives for the proposed anesthesia with the patient or authorized representative who has indicated his/her understanding and acceptance.   Dental  advisory given  Plan Discussed with: CRNA and Surgeon  Anesthesia Plan Comments:         Anesthesia Quick Evaluation

## 2017-07-06 ENCOUNTER — Encounter (HOSPITAL_COMMUNITY): Payer: Self-pay | Admitting: Gastroenterology

## 2017-07-06 LAB — HEMOGLOBIN A1C
Hgb A1c MFr Bld: 5.2 % (ref 4.8–5.6)
Mean Plasma Glucose: 103 mg/dL

## 2017-07-07 ENCOUNTER — Other Ambulatory Visit: Payer: Self-pay

## 2017-07-07 ENCOUNTER — Encounter (HOSPITAL_COMMUNITY): Payer: Self-pay | Admitting: *Deleted

## 2017-07-07 DIAGNOSIS — Z992 Dependence on renal dialysis: Secondary | ICD-10-CM | POA: Diagnosis not present

## 2017-07-07 DIAGNOSIS — Z96653 Presence of artificial knee joint, bilateral: Secondary | ICD-10-CM | POA: Insufficient documentation

## 2017-07-07 DIAGNOSIS — I120 Hypertensive chronic kidney disease with stage 5 chronic kidney disease or end stage renal disease: Secondary | ICD-10-CM | POA: Insufficient documentation

## 2017-07-07 DIAGNOSIS — G8929 Other chronic pain: Secondary | ICD-10-CM | POA: Diagnosis not present

## 2017-07-07 DIAGNOSIS — F17210 Nicotine dependence, cigarettes, uncomplicated: Secondary | ICD-10-CM | POA: Diagnosis not present

## 2017-07-07 DIAGNOSIS — Z79899 Other long term (current) drug therapy: Secondary | ICD-10-CM | POA: Insufficient documentation

## 2017-07-07 DIAGNOSIS — N186 End stage renal disease: Secondary | ICD-10-CM | POA: Insufficient documentation

## 2017-07-07 DIAGNOSIS — R109 Unspecified abdominal pain: Secondary | ICD-10-CM | POA: Diagnosis not present

## 2017-07-07 LAB — CBC
HEMATOCRIT: 41 % (ref 39.0–52.0)
Hemoglobin: 14.1 g/dL (ref 13.0–17.0)
MCH: 29.5 pg (ref 26.0–34.0)
MCHC: 34.4 g/dL (ref 30.0–36.0)
MCV: 85.8 fL (ref 78.0–100.0)
PLATELETS: 198 10*3/uL (ref 150–400)
RBC: 4.78 MIL/uL (ref 4.22–5.81)
RDW: 14.8 % (ref 11.5–15.5)
WBC: 12.5 10*3/uL — AB (ref 4.0–10.5)

## 2017-07-07 MED ORDER — ONDANSETRON 4 MG PO TBDP
4.0000 mg | ORAL_TABLET | Freq: Once | ORAL | Status: AC | PRN
  Administered 2017-07-07: 4 mg via ORAL
  Filled 2017-07-07: qty 1

## 2017-07-07 NOTE — ED Triage Notes (Signed)
Pt BIB EMS for NV x 3 hours with generalized abd pain. Diagnosed with Diverticulitis on Monday, unable to take zofran at home due to vomiting. Pt is a MWF dialysis patient with last treatment today

## 2017-07-08 ENCOUNTER — Emergency Department (HOSPITAL_COMMUNITY)
Admission: EM | Admit: 2017-07-08 | Discharge: 2017-07-08 | Disposition: A | Discharge status: Home / Self Care | Payer: Medicare Other | Attending: Emergency Medicine | Admitting: Emergency Medicine

## 2017-07-08 DIAGNOSIS — R109 Unspecified abdominal pain: Secondary | ICD-10-CM

## 2017-07-08 DIAGNOSIS — G8929 Other chronic pain: Secondary | ICD-10-CM

## 2017-07-08 LAB — COMPREHENSIVE METABOLIC PANEL
ALBUMIN: 4.5 g/dL (ref 3.5–5.0)
ALT: 18 U/L (ref 17–63)
AST: 23 U/L (ref 15–41)
Alkaline Phosphatase: 75 U/L (ref 38–126)
Anion gap: 22 — ABNORMAL HIGH (ref 5–15)
BUN: 27 mg/dL — AB (ref 6–20)
CHLORIDE: 92 mmol/L — AB (ref 101–111)
CO2: 22 mmol/L (ref 22–32)
CREATININE: 8.89 mg/dL — AB (ref 0.61–1.24)
Calcium: 8.8 mg/dL — ABNORMAL LOW (ref 8.9–10.3)
GFR calc Af Amer: 8 mL/min — ABNORMAL LOW (ref >60)
GFR, EST NON AFRICAN AMERICAN: 7 mL/min — AB (ref >60)
GLUCOSE: 110 mg/dL — AB (ref 65–99)
POTASSIUM: 3.4 mmol/L — AB (ref 3.5–5.1)
Sodium: 136 mmol/L (ref 135–145)
Total Bilirubin: 1 mg/dL (ref 0.3–1.2)
Total Protein: 8.9 g/dL — ABNORMAL HIGH (ref 6.5–8.1)

## 2017-07-08 LAB — POC OCCULT BLOOD, ED: Fecal Occult Bld: NEGATIVE

## 2017-07-08 LAB — LIPASE, BLOOD: LIPASE: 35 U/L (ref 11–51)

## 2017-07-08 MED ORDER — SODIUM CHLORIDE 0.9 % IV BOLUS (SEPSIS)
500.0000 mL | Freq: Once | INTRAVENOUS | Status: AC
  Administered 2017-07-08: 500 mL via INTRAVENOUS

## 2017-07-08 MED ORDER — MORPHINE SULFATE (PF) 4 MG/ML IV SOLN
4.0000 mg | Freq: Once | INTRAVENOUS | Status: DC
  Filled 2017-07-08: qty 1

## 2017-07-08 MED ORDER — FAMOTIDINE IN NACL 20-0.9 MG/50ML-% IV SOLN
20.0000 mg | Freq: Once | INTRAVENOUS | Status: AC
  Administered 2017-07-08: 20 mg via INTRAVENOUS
  Filled 2017-07-08: qty 50

## 2017-07-08 MED ORDER — PANTOPRAZOLE SODIUM 40 MG IV SOLR
40.0000 mg | Freq: Once | INTRAVENOUS | Status: AC
  Administered 2017-07-08: 40 mg via INTRAVENOUS
  Filled 2017-07-08: qty 40

## 2017-07-08 MED ORDER — PROMETHAZINE HCL 50 MG RE SUPP
50.0000 mg | Freq: Once | RECTAL | Status: DC
  Filled 2017-07-08: qty 1

## 2017-07-08 MED ORDER — ONDANSETRON HCL 4 MG/2ML IJ SOLN
4.0000 mg | Freq: Once | INTRAMUSCULAR | Status: AC
  Administered 2017-07-08: 4 mg via INTRAVENOUS
  Filled 2017-07-08: qty 2

## 2017-07-08 MED ORDER — GI COCKTAIL ~~LOC~~
30.0000 mL | Freq: Once | ORAL | Status: AC
  Administered 2017-07-08: 30 mL via ORAL
  Filled 2017-07-08: qty 30

## 2017-07-08 MED ORDER — HALOPERIDOL LACTATE 5 MG/ML IJ SOLN
2.0000 mg | Freq: Once | INTRAMUSCULAR | Status: AC
  Administered 2017-07-08: 2 mg via INTRAVENOUS
  Filled 2017-07-08: qty 1

## 2017-07-08 MED ORDER — DICYCLOMINE HCL 10 MG/ML IM SOLN
20.0000 mg | Freq: Once | INTRAMUSCULAR | Status: AC
  Administered 2017-07-08: 20 mg via INTRAMUSCULAR
  Filled 2017-07-08: qty 2

## 2017-07-08 MED ORDER — CAPSAICIN 0.025 % EX CREA
TOPICAL_CREAM | Freq: Two times a day (BID) | CUTANEOUS | Status: DC
  Administered 2017-07-08: 08:00:00 via TOPICAL
  Filled 2017-07-08: qty 60

## 2017-07-08 NOTE — Discharge Instructions (Signed)
Please take Zofran at home as needed for nausea and vomiting.  Continue to take Pepcid and Protonix at home for inflammation in the stomach.  Please go to your follow-up appointment with the GI doctor at [facility].   Return to the ER if you have vomiting that does not stop despite taking zofran, if you have worsening abdominal pain with fever greater than 100.4 F or have any new or worsening symptoms.

## 2017-07-08 NOTE — ED Provider Notes (Signed)
[facility] EMERGENCY DEPARTMENT Provider Note   CSN: [id number] Arrival date & time: 07/07/17  2338     History   Chief Complaint Chief Complaint  Patient presents with  . Nausea  . Emesis    HPI [name] is a 31 y.o. male with history of end-stage renal disease on hemodialysis Monday Wednesday Friday, GERD, gastroparesis, hypertension, obesity presents to ED for evaluation of generalized abdominal pain, constantonset yesterday. Associated with nausea and vomiting. Noted streaks of blood in emesis yesterday. No coffee-ground emesis. No fevers, chills, chest pain, shortness of breath, palpitations, diarrhea, constipation, melena or hematochezia. Attempted to take antiemetics at home but vomited them. No alleviating factors. Dental pain worse with palpation. Has been unable to eat or keep fluids down since symptoms started. Denies marijuana or alcohol abuse. Of note, patient recently discharged 2 days ago from admission for similar symptoms.    HPI  Past Medical History:  Diagnosis Date  . Arthritis    "all over" (11/19/2016)  . Chronic lower back pain   . Complication of anesthesia    A little while to wake up after knee surgery in 2008  . Dizziness    when coming off of dialysis  . ESRD (end stage renal disease) on dialysis [facility])    MWF and goes to [facility] (11/19/2016)  . Family history of anesthesia complication    mom slow to wake up  . GERD (gastroesophageal reflux disease)    takes Omeprazole as needed  . IOEVOJJK(093.8)    "monthly" (11/19/2016)  . History of hiatal hernia   . Hyperparathyroidism ([place])   . Hypertension   . Joint pain   . Joint swelling   . Morbid obesity ([place])   . PONV (postoperative nausea and vomiting)   . Renal insufficiency    Pt is on dyalisis and has been for 14 years    Patient Active Problem List   Diagnosis Date Noted  . Abdominal pain 07/04/2017  . Gastritis and gastroduodenitis 11/19/2016  . Increased  anion gap metabolic acidosis 18/29/9371  . Intractable abdominal pain   . Common bile duct dilatation   . Cholelithiasis without cholecystitis   . Intractable nausea and vomiting 10/08/2016  . Hematemesis 08/14/2016  . GERD (gastroesophageal reflux disease) 08/14/2016  . Essential hypertension 08/14/2016  . GIB (gastrointestinal bleeding) 08/14/2016  . SIRS (systemic inflammatory response syndrome) ([place]) 08/14/2016  . CKD (chronic kidney disease) stage V requiring chronic dialysis ([place]) 06/11/2016  . Non-intractable vomiting with nausea 06/11/2016  . Hepatic steatosis 06/11/2016  . Cholelithiasis 06/11/2016  . Localized osteoarthritis of left knee 07/11/2015  . Abdominal pain, epigastric 12/05/2014  . Lapband April 2016 11/27/2014  . Arthritis of knee 03/27/2014  . Morbid obesity, weight 291, BMI - 47 02/15/2014  . Hyperparathyroidism, secondary ([place]) 03/14/2013    Past Surgical History:  Procedure Laterality Date  . AV FISTULA PLACEMENT Bilateral 2005,2007   "right; left"  . AV FISTULA REPAIR Right 2007   "tried to clean it out; couldn't do it"  . BREATH TEK H PYLORI N/A 05/15/2014   Procedure: BREATH TEK H PYLORI;  Surgeon: Alphonsa Overall, MD;  Location: [facility];  Service: General;  Laterality: N/A;  . CHOLECYSTECTOMY N/A 11/23/2016   Procedure: LAPAROSCOPIC CHOLECYSTECTOMY WITH INTRAOPERATIVE CHOLANGIOGRAM;  Surgeon: Erroll Luna, MD;  Location: [facility];  Service: General;  Laterality: N/A;  . ESOPHAGOGASTRODUODENOSCOPY N/A 12/07/2014   Procedure: ESOPHAGOGASTRODUODENOSCOPY (EGD);  Surgeon: Alphonsa Overall, MD;  Location: [facility];  Service: General;  Laterality: N/A;  . ESOPHAGOGASTRODUODENOSCOPY N/A 12/17/2014   Procedure: ESOPHAGOGASTRODUODENOSCOPY (EGD);  Surgeon: Alphonsa Overall, MD;  Location: [facility];  Service: General;  Laterality: N/A;  . ESOPHAGOGASTRODUODENOSCOPY N/A 08/16/2016   Procedure: ESOPHAGOGASTRODUODENOSCOPY (EGD);  Surgeon: Jerene Bears, MD;  Location: [facility];  Service: Gastroenterology;  Laterality: N/A;  . ESOPHAGOGASTRODUODENOSCOPY (EGD) WITH PROPOFOL N/A 07/05/2017   Procedure: ESOPHAGOGASTRODUODENOSCOPY (EGD) WITH PROPOFOL;  Surgeon: Milus Banister, MD;  Location: [facility] END[facility]COPY;  Service: Endoscopy;  Laterality: N/A;  . HERNIA REPAIR  11/2014   w/lap band OR  . JOINT REPLACEMENT    . KNEE ARTHROSCOPY Left 2007  . LAPAROSCOPIC GASTRIC BANDING N/A 11/12/2014   Procedure: LAPAROSCOPIC GASTRIC BANDING;  Surgeon: Alphonsa Overall, MD;  Location: [facility];  Service: General;  Laterality: N/A;  . PARATHYROIDECTOMY N/A 03/30/2013   Procedure: TOTAL PARATHYROIDECTOMY WITH AUTOTRANSPLANT;  Surgeon: Earnstine Regal, MD;  Location: [facility];  Service: General;  Laterality: N/A;  Autotransplant to left lower arm.  Marland Kitchen TOTAL KNEE ARTHROPLASTY Right 03/27/2014   Procedure: UNICOMPARTMENTAL ARTHROPLASTY;  Surgeon: Meredith Pel, MD;  Location: [facility];  Service: Orthopedics;  Laterality: Right;       Home Medications    Prior to Admission medications   Medication Sig Start Date End Date Taking? Authorizing Provider  albuterol (PROVENTIL HFA;VENTOLIN HFA) 108 (90 Base) MCG/ACT inhaler Inhale 2 puffs into the lungs every 4 (four) hours as needed for wheezing or shortness of breath. 05/14/16   Lysbeth Penner, [name]  calcium carbonate (TUMS - DOSED IN MG ELEMENTAL CALCIUM) 500 MG chewable tablet Chew 2 tablets by mouth 2 (two) times daily as needed for indigestion or heartburn.     [provider]  famotidine (PEPCID) 10 MG tablet Take 1 tablet (10 mg total) by mouth at bedtime. 07/05/17   Alma Friendly, MD  metoCLOPramide (REGLAN) 10 MG tablet Take 1 tablet (10 mg total) by mouth at bedtime. 07/05/17   Alma Friendly, MD  multivitamin (RENA-VIT) TABS tablet Take 1 tablet by mouth daily.    [provider]  ondansetron (ZOFRAN) 4 MG tablet Take 1 tablet (4 mg total) by mouth every 12 (twelve) hours. 07/05/17   Alma Friendly,  MD  pantoprazole (PROTONIX) 40 MG tablet Take 1 tablet (40 mg total) by mouth daily before breakfast. 07/06/17   Alma Friendly, MD  sevelamer carbonate (RENVELA) 2.4 G PACK Take 2.4 g by mouth 3 (three) times daily with meals.     [provider]    Family History Family History  Problem Relation Age of Onset  . Hypertension Mother   . Diabetes Father   . Kidney Stones Sister   . Diabetes Maternal Grandmother   . Liver cancer Maternal Uncle     Social History Social History   Tobacco Use  . Smoking status: Current Every Day Smoker    Packs/day: 0.10    Years: 16.00    Pack years: 1.60    Types: Cigarettes    Last attempt to quit: 12/17/2014    Years since quitting: 2.5  . Smokeless tobacco: Never Used  Substance Use Topics  . Alcohol use: No    Alcohol/week: 0.0 oz  . Drug use: No     Allergies   Dilaudid [hydromorphone hcl]   Review of Systems Review of Systems  Gastrointestinal: Positive for abdominal pain, nausea and vomiting.  Allergic/Immunologic: Positive for immunocompromised state (ESRD).  All other systems reviewed and are  negative.    Physical Exam Updated Vital Signs BP 124/72   Pulse 84   Temp 98.4 F (36.9 C) (Oral)   Resp 16   SpO2 97%   Physical Exam  Constitutional: He is oriented to person, place, and time. He appears well-developed and well-nourished. No distress.  NAD. Looks uncomfortable but non toxic.   HENT:  Head: Normocephalic and atraumatic.  Right Ear: External ear normal.  Left Ear: External ear normal.  Nose: Nose normal.  Moist mucous membranes   Eyes: Conjunctivae and EOM are normal. No scleral icterus.  Neck: Normal range of motion. Neck supple.  Cardiovascular: Normal rate, regular rhythm, normal heart sounds and intact distal pulses.  No murmur heard. No LE edema. No orthopnea.   Pulmonary/Chest: Effort normal and breath sounds normal. He has no wheezes.  Decreased BS in bilateral lower lobes,  difficult exam given body habitus. No crackles.   Abdominal: Soft. Bowel sounds are normal. There is tenderness.  Diffuse, nonfocal abdominal tenderness. Subjective guarding in all quadrants. No rigidity or rebound. No suprapubic or CVAT. Surgical scars noted.   Musculoskeletal: Normal range of motion. He exhibits no deformity.  Neurological: He is alert and oriented to person, place, and time.  Skin: Skin is warm and dry. Capillary refill takes less than 2 seconds.  Palpable thrill to RUE HD access   Psychiatric: He has a normal mood and affect. His behavior is normal. Judgment and thought content normal.  Nursing note and vitals reviewed.    ED Treatments / Results  Labs (all labs ordered are listed, but only abnormal results are displayed) Labs Reviewed  COMPREHENSIVE METABOLIC PANEL - Abnormal; Notable for the following components:      Result Value   Potassium 3.4 (*)    Chloride 92 (*)    Glucose, Bld 110 (*)    BUN 27 (*)    Creatinine, Ser 8.89 (*)    Calcium 8.8 (*)    Total Protein 8.9 (*)    GFR calc non Af Amer 7 (*)    GFR calc Af Amer 8 (*)    Anion gap 22 (*)    All other components within normal limits  CBC - Abnormal; Notable for the following components:   WBC 12.5 (*)    All other components within normal limits  LIPASE, BLOOD  URINALYSIS, ROUTINE W REFLEX MICROSCOPIC  POC OCCULT BLOOD, ED    EKG  EKG Interpretation None       Radiology No results found.  Procedures Procedures (including critical care time)  Medications Ordered in ED Medications  ondansetron (ZOFRAN) injection 4 mg (not administered)  morphine 4 MG/ML injection 4 mg (not administered)  pantoprazole (PROTONIX) injection 40 mg (not administered)  famotidine (PEPCID) IVPB 20 mg premix (not administered)  capsaicin (ZOSTRIX) 0.025 % cream (not administered)  promethazine (PHENERGAN) suppository 50 mg (not administered)  sodium chloride 0.9 % bolus 500 mL (not administered)    ondansetron (ZOFRAN-ODT) disintegrating tablet 4 mg (4 mg Oral Given 07/07/17 2350)  gi cocktail (Maalox,Lidocaine,Donnatal) (30 mLs Oral Given 07/08/17 7616)  dicyclomine (BENTYL) injection 20 mg (20 mg Intramuscular Given 07/08/17 0631)     Initial Impression / Assessment and Plan / ED Course  I have reviewed the triage vital signs and the nursing notes.  Pertinent labs & imaging results that were available during my care of the patient were reviewed by me and considered in my medical decision making (see chart for details).    31 year old  male with medical history significant for ESRD on HD, lap band in 2016, chronic abdominal pain, nausea and vomiting presents to ED for evaluation of nausea, vomiting and abdominal pain since yesterday. He was admitted from hospital four days ago similar symptoms. He underwent EGD showing gastritis and duodenitis, CTAP unremarkable. GI recommended D/C with protonix, famotidine, antiemetics and outpatient f/u. He has f/u appointment with [facility] in January. He denies marijuana or EtOH use.  On exam he is non toxic. He has diffuse, non focal abdominal tenderness w/o peritoneal signs. Afebrile w/o tachycardia. DRE w/o gross blood, negative hemoccult.   Final Clinical Impressions(s) / ED Diagnoses   Will start IVF, analgesia, antiemetics. Rectal phenergan until IV access obtained. Given chronicity of symptoms, recent admission with extensive and reassuring work up, stable lab work today anticipate ED course will focus on symptom control. Anticipate d/c with GI f/u. Consider CT and GI consult if clinical decline. Patient, ED treatment and discharge plan was discussed with supervising physician who is agreeable with plan. Will hand off pt to oncoming EDPA who will re-eval patient and determine disposition.   Final diagnoses:  Chronic abdominal pain    ED Discharge Orders    None       Arlean Hopping 07/08/17 7106    Orpah Greek,  MD 07/08/17 [id number]

## 2017-07-08 NOTE — ED Provider Notes (Signed)
Received signout from [place] who initially saw this patient.  In brief, this is an ESRD patient who presented with nausea, vomiting and generalized abdominal pain.  He was recently admitted (4d ago) for similar symptoms with extensive and reassuring workup, discharged with antiemetics and antiacids for gastritis.  He has a follow-up appointment with [facility].  ED course: Was given GI cocktail, Pepcid, Protonix and Bentyl for abdominal pain.  Zofran and Haldol for nausea.  On recheck, patient denies nausea/vomiting.  Reports that his abdominal pain is improved.  He is able to tolerate p.o. fluids at the bedside.  In no acute distress and vital signs are stable.  Have counseled him to follow-up at his GI appointment with [facility].  Patient agrees and voiced understanding to the above plan.  Blood pressure 118/73, pulse 87, temperature 98.4 F (36.9 C), temperature source Oral, resp. rate 18, SpO2 95 %.     Glyn Ade, [name] 07/08/17 1043    Macarthur Critchley, MD 07/08/17 1458

## 2017-07-08 NOTE — ED Notes (Signed)
Attempted iv x1 unsuccessful. Unable to locate other site

## 2017-07-09 ENCOUNTER — Emergency Department (HOSPITAL_COMMUNITY)
Admission: EM | Admit: 2017-07-09 | Discharge: 2017-07-09 | Disposition: A | Discharge status: Home / Self Care | Payer: Medicare Other | Source: Home / Self Care | Attending: Emergency Medicine | Admitting: Emergency Medicine

## 2017-07-09 ENCOUNTER — Encounter (HOSPITAL_COMMUNITY): Payer: Self-pay | Admitting: Emergency Medicine

## 2017-07-09 ENCOUNTER — Emergency Department (HOSPITAL_COMMUNITY)
Admission: EM | Admit: 2017-07-09 | Discharge: 2017-07-10 | Disposition: A | Discharge status: Home / Self Care | Payer: Medicare Other | Attending: Emergency Medicine | Admitting: Emergency Medicine

## 2017-07-09 DIAGNOSIS — I120 Hypertensive chronic kidney disease with stage 5 chronic kidney disease or end stage renal disease: Secondary | ICD-10-CM

## 2017-07-09 DIAGNOSIS — Z96651 Presence of right artificial knee joint: Secondary | ICD-10-CM | POA: Insufficient documentation

## 2017-07-09 DIAGNOSIS — F17210 Nicotine dependence, cigarettes, uncomplicated: Secondary | ICD-10-CM

## 2017-07-09 DIAGNOSIS — N186 End stage renal disease: Secondary | ICD-10-CM | POA: Insufficient documentation

## 2017-07-09 DIAGNOSIS — Z992 Dependence on renal dialysis: Secondary | ICD-10-CM

## 2017-07-09 DIAGNOSIS — R1084 Generalized abdominal pain: Secondary | ICD-10-CM | POA: Diagnosis present

## 2017-07-09 DIAGNOSIS — M62838 Other muscle spasm: Secondary | ICD-10-CM | POA: Diagnosis not present

## 2017-07-09 DIAGNOSIS — N185 Chronic kidney disease, stage 5: Secondary | ICD-10-CM | POA: Diagnosis not present

## 2017-07-09 DIAGNOSIS — R252 Cramp and spasm: Secondary | ICD-10-CM | POA: Insufficient documentation

## 2017-07-09 LAB — BASIC METABOLIC PANEL
Anion gap: 16 — ABNORMAL HIGH (ref 5–15)
BUN: 22 mg/dL — ABNORMAL HIGH (ref 6–20)
CALCIUM: 8.3 mg/dL — AB (ref 8.9–10.3)
CO2: 24 mmol/L (ref 22–32)
CREATININE: 8.46 mg/dL — AB (ref 0.61–1.24)
Chloride: 95 mmol/L — ABNORMAL LOW (ref 101–111)
GFR calc Af Amer: 9 mL/min — ABNORMAL LOW (ref >60)
GFR calc non Af Amer: 7 mL/min — ABNORMAL LOW (ref >60)
GLUCOSE: 120 mg/dL — AB (ref 65–99)
Potassium: 3.2 mmol/L — ABNORMAL LOW (ref 3.5–5.1)
Sodium: 135 mmol/L (ref 135–145)

## 2017-07-09 MED ORDER — LORAZEPAM 2 MG/ML IJ SOLN
1.0000 mg | Freq: Once | INTRAMUSCULAR | Status: AC
  Administered 2017-07-09: 1 mg via INTRAMUSCULAR
  Filled 2017-07-09: qty 1

## 2017-07-09 MED ORDER — DIAZEPAM 2 MG PO TABS
2.0000 mg | ORAL_TABLET | Freq: Once | ORAL | Status: AC
  Administered 2017-07-09: 2 mg via ORAL
  Filled 2017-07-09: qty 1

## 2017-07-09 NOTE — ED Notes (Signed)
Pt moved into room for deaccess of dialysis catheter by iv team.

## 2017-07-09 NOTE — ED Notes (Signed)
ED Provider at bedside. 

## 2017-07-09 NOTE — ED Notes (Signed)
Pt unable to sign, WOW not working. Pt verbalizes consent for discharge

## 2017-07-09 NOTE — ED Triage Notes (Signed)
Per EMS: Pt to ED for muscle spasms of bilateral hips starting at 2300. Dialysis MWF. Pt had a 2 hour shorter treatment d/t hip pain . Pt given valium earlier today at [facility] and pain came back.

## 2017-07-09 NOTE — ED Triage Notes (Signed)
Pt arrives via [facility] from dialysis center where pt completed approx 2 hours of session, pt then started having muscle cramping in his legs. Pt seen here on Wednesday for n/v, sent home with GI meds. A/ox4. EMS vss:132/78, rr 20, cbg 126.

## 2017-07-09 NOTE — ED Provider Notes (Signed)
[place] EMERGENCY DEPARTMENT Provider Note   CSN: [id number] Arrival date & time: 07/09/17  2244     History   Chief Complaint Chief Complaint  Patient presents with  . Spasms    HPI [name] is a 31 y.o. male.  HPI   [name] is a 31 y/o male with a h/o ESRD on HD M/W/F, HTN, hyperparathyroidism, GERD who presents to the ED c/o sharp muscle spasms to his back, bilateral legs, and abdomen that are intermittent in nature. Pt was seen in the ED earlier today after he c/o muscle spasms during his dialysis treatment and was sent to the ED. During the visit, he was given valium and was found to have a potassium of 3.4. Spasms were thought to be related to fluid shifts and electrolyte changes during dialysis.   After being discharged today, pt initially felt improved, but shortly after leaving the dept his muscle spasms returned. He c/o sharp muscle spasms to the above mentioned areas. He further c/o diaphoresi and generalized abd pain, but denies any nausea, vomiting, diarrhea, or constipation. His last BM was earlier today and was normal. He denies any chest pain, SOB, or HA. He denies any numbness or tingling to his legs and states that he is still able to ambulate, but only for a short period of time.   Past Medical History:  Diagnosis Date  . Arthritis    "all over" (11/19/2016)  . Chronic lower back pain   . Complication of anesthesia    A little while to wake up after knee surgery in 2008  . Dizziness    when coming off of dialysis  . ESRD (end stage renal disease) on dialysis [facility])    MWF and goes to [facility] (11/19/2016)  . Family history of anesthesia complication    mom slow to wake up  . GERD (gastroesophageal reflux disease)    takes Omeprazole as needed  . IPJASNKN(397.6)    "monthly" (11/19/2016)  . History of hiatal hernia   . Hyperparathyroidism ([place])   . Hypertension   . Joint pain   . Joint swelling   . Morbid obesity ([place])     . PONV (postoperative nausea and vomiting)   . Renal insufficiency    Pt is on dyalisis and has been for 14 years    Patient Active Problem List   Diagnosis Date Noted  . Abdominal pain 07/04/2017  . Gastritis and gastroduodenitis 11/19/2016  . Increased anion gap metabolic acidosis 73/41/9379  . Intractable abdominal pain   . Common bile duct dilatation   . Cholelithiasis without cholecystitis   . Intractable nausea and vomiting 10/08/2016  . Hematemesis 08/14/2016  . GERD (gastroesophageal reflux disease) 08/14/2016  . Essential hypertension 08/14/2016  . GIB (gastrointestinal bleeding) 08/14/2016  . SIRS (systemic inflammatory response syndrome) ([place]) 08/14/2016  . CKD (chronic kidney disease) stage V requiring chronic dialysis ([place]) 06/11/2016  . Non-intractable vomiting with nausea 06/11/2016  . Hepatic steatosis 06/11/2016  . Cholelithiasis 06/11/2016  . Localized osteoarthritis of left knee 07/11/2015  . Abdominal pain, epigastric 12/05/2014  . Lapband April 2016 11/27/2014  . Arthritis of knee 03/27/2014  . Morbid obesity, weight 291, BMI - 47 02/15/2014  . Hyperparathyroidism, secondary ([place]) 03/14/2013    Past Surgical History:  Procedure Laterality Date  . AV FISTULA PLACEMENT Bilateral 2005,2007   "right; left"  . AV FISTULA REPAIR Right 2007   "tried to clean it out; couldn't do it"  .  BREATH TEK H PYLORI N/A 05/15/2014   Procedure: BREATH TEK H PYLORI;  Surgeon: Alphonsa Overall, MD;  Location: [facility];  Service: General;  Laterality: N/A;  . CHOLECYSTECTOMY N/A 11/23/2016   Procedure: LAPAROSCOPIC CHOLECYSTECTOMY WITH INTRAOPERATIVE CHOLANGIOGRAM;  Surgeon: Erroll Luna, MD;  Location: [place];  Service: General;  Laterality: N/A;  . ESOPHAGOGASTRODUODENOSCOPY N/A 12/07/2014   Procedure: ESOPHAGOGASTRODUODENOSCOPY (EGD);  Surgeon: Alphonsa Overall, MD;  Location: [facility] ENDOSCOPY;  Service: General;  Laterality: N/A;  . ESOPHAGOGASTRODUODENOSCOPY N/A 12/17/2014    Procedure: ESOPHAGOGASTRODUODENOSCOPY (EGD);  Surgeon: Alphonsa Overall, MD;  Location: [place];  Service: General;  Laterality: N/A;  . ESOPHAGOGASTRODUODENOSCOPY N/A 08/16/2016   Procedure: ESOPHAGOGASTRODUODENOSCOPY (EGD);  Surgeon: Jerene Bears, MD;  Location: [place];  Service: Gastroenterology;  Laterality: N/A;  . ESOPHAGOGASTRODUODENOSCOPY (EGD) WITH PROPOFOL N/A 07/05/2017   Procedure: ESOPHAGOGASTRODUODENOSCOPY (EGD) WITH PROPOFOL;  Surgeon: Milus Banister, MD;  Location: [facility] ENDOSCOPY;  Service: Endoscopy;  Laterality: N/A;  . HERNIA REPAIR  11/2014   w/lap band OR  . JOINT REPLACEMENT    . KNEE ARTHROSCOPY Left 2007  . LAPAROSCOPIC GASTRIC BANDING N/A 11/12/2014   Procedure: LAPAROSCOPIC GASTRIC BANDING;  Surgeon: Alphonsa Overall, MD;  Location: [facility];  Service: General;  Laterality: N/A;  . PARATHYROIDECTOMY N/A 03/30/2013   Procedure: TOTAL PARATHYROIDECTOMY WITH AUTOTRANSPLANT;  Surgeon: Earnstine Regal, MD;  Location: [place];  Service: General;  Laterality: N/A;  Autotransplant to left lower arm.  Marland Kitchen TOTAL KNEE ARTHROPLASTY Right 03/27/2014   Procedure: UNICOMPARTMENTAL ARTHROPLASTY;  Surgeon: Meredith Pel, MD;  Location: [place];  Service: Orthopedics;  Laterality: Right;       Home Medications    Prior to Admission medications   Medication Sig Start Date End Date Taking? Authorizing Provider  albuterol (PROVENTIL HFA;VENTOLIN HFA) 108 (90 Base) MCG/ACT inhaler Inhale 2 puffs into the lungs every 4 (four) hours as needed for wheezing or shortness of breath. 05/14/16   Lysbeth Penner, [name]  calcium carbonate (TUMS - DOSED IN MG ELEMENTAL CALCIUM) 500 MG chewable tablet Chew 2 tablets by mouth 2 (two) times daily as needed for indigestion or heartburn.     [provider]  famotidine (PEPCID) 10 MG tablet Take 1 tablet (10 mg total) by mouth at bedtime. 07/05/17   Alma Friendly, MD  metoCLOPramide (REGLAN) 10 MG tablet Take 1 tablet (10 mg total) by mouth at  bedtime. 07/05/17   Alma Friendly, MD  multivitamin (RENA-VIT) TABS tablet Take 1 tablet by mouth daily.    [provider]  ondansetron (ZOFRAN) 4 MG tablet Take 1 tablet (4 mg total) by mouth every 12 (twelve) hours. 07/05/17   Alma Friendly, MD  pantoprazole (PROTONIX) 40 MG tablet Take 1 tablet (40 mg total) by mouth daily before breakfast. 07/06/17   Alma Friendly, MD  sevelamer carbonate (RENVELA) 2.4 G PACK Take 2.4 g by mouth 3 (three) times daily with meals.     [provider]    Family History Family History  Problem Relation Age of Onset  . Hypertension Mother   . Diabetes Father   . Kidney Stones Sister   . Diabetes Maternal Grandmother   . Liver cancer Maternal Uncle     Social History Social History   Tobacco Use  . Smoking status: Current Every Day Smoker    Packs/day: 0.10    Years: 16.00    Pack years: 1.60    Types: Cigarettes    Last attempt to  quit: 12/17/2014    Years since quitting: 2.5  . Smokeless tobacco: Never Used  Substance Use Topics  . Alcohol use: No    Alcohol/week: 0.0 oz  . Drug use: No     Allergies   Dilaudid [hydromorphone hcl]   Review of Systems Review of Systems  Constitutional: Positive for diaphoresis.  Respiratory: Negative for chest tightness and shortness of breath.   Cardiovascular: Negative for chest pain and palpitations.  Gastrointestinal: Positive for abdominal pain (generalized). Negative for constipation, diarrhea, nausea and vomiting.  Musculoskeletal: Positive for back pain. Negative for gait problem.       Bilateral leg pain   Skin: Negative for color change and rash.  Neurological: Negative for light-headedness and numbness.  All other systems reviewed and are negative.    Physical Exam Updated Vital Signs BP (!) 144/77 (BP Location: Right Arm)   Pulse (!) 109   Temp 97.9 F (36.6 C) (Oral)   Resp 18   SpO2 96%   Physical Exam  Constitutional: He is oriented to  person, place, and time. He appears well-developed and well-nourished. He appears distressed.  Pt appears uncomfortable and is unable to stay still on exam table.  HENT:  Head: Normocephalic.  Eyes: Conjunctivae and EOM are normal. No scleral icterus.  Neck: Normal range of motion.  Cardiovascular: Regular rhythm, normal heart sounds and intact distal pulses.  No murmur heard. Tachycardic  Pulmonary/Chest: Breath sounds normal. He has no wheezes.  tachypneic  Abdominal: Soft. Bowel sounds are normal.  Mild TTP to the LLQ  Musculoskeletal: Normal range of motion. He exhibits no edema.  TTP to the bilateral trochanteric bursae. Mild TTP to the bilateral upper thighs.   Neurological: He is alert and oriented to person, place, and time. He exhibits normal muscle tone. Coordination normal.  Normal strength to the bilateral lower extremities  Skin: Skin is warm.  diaphoretic  Psychiatric:  Pt appears anxious      ED Treatments / Results  Labs (all labs ordered are listed, but only abnormal results are displayed) Labs Reviewed  I-STAT CHEM 8, ED - Abnormal; Notable for the following components:      Result Value   Potassium 3.4 (*)    Chloride 99 (*)    BUN 28 (*)    Creatinine, Ser 10.70 (*)    Glucose, Bld 103 (*)    Calcium, Ion 0.85 (*)    Hemoglobin 12.6 (*)    HCT 37.0 (*)    All other components within normal limits    EKG  EKG Interpretation None       Radiology No results found.  Procedures Procedures (including critical care time)  Medications Ordered in ED Medications  calcium carbonate (TUMS - dosed in mg elemental calcium) chewable tablet 400 mg of elemental calcium (not administered)  LORazepam (ATIVAN) injection 1 mg (1 mg Intramuscular Given 07/09/17 2355)     Initial Impression / Assessment and Plan / ED Course  I have reviewed the triage vital signs and the nursing notes.  Pertinent labs & imaging results that were available during my care of  the patient were reviewed by me and considered in my medical decision making (see chart for details).   12:11 Rechecked pt. He appears to be less anxious. He just received Ativan.   12:44 Rechecked pt. He states that he is feeling improved after receiving Ativan. I discussed the results of his labs and that he was noted to have a low K and  a low ionized calcium. I further discussed the plan to administer calcium carbonate and discharge him with instructions f/u with his PCP within 1 week. Advised the pt to return to the ED if he experiences any continued or worsening muscle spasms, chest pain, or SOB.   1:30 AM Per nurse, vitals rechecked and pt no longer tachycardia. HR in the 90s prior to discharge.  Final Clinical Impressions(s) / ED Diagnoses   Final diagnoses:  Low calcium levels  Muscle spasm   Pt with ESRD on HD M/W/F, HTN, and hyperparathyroidism presented to the ED c/o muscle spasms. Labs revealed low K at 3.4, which was improved from earlier today at 3.2. Labs also relevant for a decreased ionized Ca of 0.85 mmol/L, which could be contributing to his muscle spasms. He showed much improvement with 1mg  Ativan and 1000mg  calcium carbonate given in the ED. With the exception of mild tachycardia, his vital signs have been stable throughout his visit and he appears to be stable for discharge.    ED Discharge Orders    None       Rodney Booze, [name] 07/10/17 0109    Rodney Booze, [name] 07/10/17 0131    Ward, Delice [place], DO 07/10/17 0210

## 2017-07-09 NOTE — ED Provider Notes (Signed)
[place] EMERGENCY DEPARTMENT Provider Note   CSN: [id number] Arrival date & time: 07/09/17  1328     History   Chief Complaint Chief Complaint  Patient presents with  . Muscle Pain    HPI [name] is a 31 y.o. male.  31yo M w/ PMH including ESRD on HD M/W/F, HTN, hyperparathyroidism, GERD who p/w muscle cramping. Pt was 2 hours into his routine dialysis session when he began having bilateral leg cramps and some cramping in his jaws.  It felt like muscle spasming.  He denies any associated chest pain or shortness of breath.  They stopped dialysis and sent him to the ED for further evaluation.  He states that this does not usually happen during dialysis.  He was here in the ED 2 days ago for nausea and vomiting and was sent home on medications.  He has not had any vomiting today.  No fevers or cough.  His cramping has gotten better but is still present.   The history is provided by the patient.  Muscle Pain     Past Medical History:  Diagnosis Date  . Arthritis    "all over" (11/19/2016)  . Chronic lower back pain   . Complication of anesthesia    A little while to wake up after knee surgery in 2008  . Dizziness    when coming off of dialysis  . ESRD (end stage renal disease) on dialysis [facility])    MWF and goes to [facility] (11/19/2016)  . Family history of anesthesia complication    mom slow to wake up  . GERD (gastroesophageal reflux disease)    takes Omeprazole as needed  . WSFKCLEX(517.0)    "monthly" (11/19/2016)  . History of hiatal hernia   . Hyperparathyroidism ([place])   . Hypertension   . Joint pain   . Joint swelling   . Morbid obesity ([place])   . PONV (postoperative nausea and vomiting)   . Renal insufficiency    Pt is on dyalisis and has been for 14 years    Patient Active Problem List   Diagnosis Date Noted  . Abdominal pain 07/04/2017  . Gastritis and gastroduodenitis 11/19/2016  . Increased anion gap metabolic acidosis  01/74/9449  . Intractable abdominal pain   . Common bile duct dilatation   . Cholelithiasis without cholecystitis   . Intractable nausea and vomiting 10/08/2016  . Hematemesis 08/14/2016  . GERD (gastroesophageal reflux disease) 08/14/2016  . Essential hypertension 08/14/2016  . GIB (gastrointestinal bleeding) 08/14/2016  . SIRS (systemic inflammatory response syndrome) ([place]) 08/14/2016  . CKD (chronic kidney disease) stage V requiring chronic dialysis ([place]) 06/11/2016  . Non-intractable vomiting with nausea 06/11/2016  . Hepatic steatosis 06/11/2016  . Cholelithiasis 06/11/2016  . Localized osteoarthritis of left knee 07/11/2015  . Abdominal pain, epigastric 12/05/2014  . Lapband April 2016 11/27/2014  . Arthritis of knee 03/27/2014  . Morbid obesity, weight 291, BMI - 47 02/15/2014  . Hyperparathyroidism, secondary ([place]) 03/14/2013    Past Surgical History:  Procedure Laterality Date  . AV FISTULA PLACEMENT Bilateral 2005,2007   "right; left"  . AV FISTULA REPAIR Right 2007   "tried to clean it out; couldn't do it"  . BREATH TEK H PYLORI N/A 05/15/2014   Procedure: BREATH TEK H PYLORI;  Surgeon: Alphonsa Overall, MD;  Location: [facility];  Service: General;  Laterality: N/A;  . CHOLECYSTECTOMY N/A 11/23/2016   Procedure: LAPAROSCOPIC CHOLECYSTECTOMY WITH INTRAOPERATIVE CHOLANGIOGRAM;  Surgeon: Erroll Luna, MD;  Location: [facility];  Service: General;  Laterality: N/A;  . ESOPHAGOGASTRODUODENOSCOPY N/A 12/07/2014   Procedure: ESOPHAGOGASTRODUODENOSCOPY (EGD);  Surgeon: Alphonsa Overall, MD;  Location[facility];  Service: General;  Laterality: N/A;  . ESOPHAGOGASTRODUODENOSCOPY N/A 12/17/2014   Procedure: ESOPHAGOGASTRODUODENOSCOPY (EGD);  Surgeon: Alphonsa Overall, MD;  Location: [place];  Service: General;  Laterality: N/A;  . ESOPHAGOGASTRODUODENOSCOPY N/A 08/16/2016   Procedure: ESOPHAGOGASTRODUODENOSCOPY (EGD);  Surgeon: Jerene Bears, MD;  Location: [place];  Service:  Gastroenterology;  Laterality: N/A;  . ESOPHAGOGASTRODUODENOSCOPY (EGD) WITH PROPOFOL N/A 07/05/2017   Procedure: ESOPHAGOGASTRODUODENOSCOPY (EGD) WITH PROPOFOL;  Surgeon: Milus Banister, MD;  Location: [facility] ENDOSCOPY;  Service: Endoscopy;  Laterality: N/A;  . HERNIA REPAIR  11/2014   w/lap band OR  . JOINT REPLACEMENT    . KNEE ARTHROSCOPY Left 2007  . LAPAROSCOPIC GASTRIC BANDING N/A 11/12/2014   Procedure: LAPAROSCOPIC GASTRIC BANDING;  Surgeon: Alphonsa Overall, MD;  Location: [facility];  Service: General;  Laterality: N/A;  . PARATHYROIDECTOMY N/A 03/30/2013   Procedure: TOTAL PARATHYROIDECTOMY WITH AUTOTRANSPLANT;  Surgeon: Earnstine Regal, MD;  Location: [place];  Service: General;  Laterality: N/A;  Autotransplant to left lower arm.  Marland Kitchen TOTAL KNEE ARTHROPLASTY Right 03/27/2014   Procedure: UNICOMPARTMENTAL ARTHROPLASTY;  Surgeon: Meredith Pel, MD;  Location: [place];  Service: Orthopedics;  Laterality: Right;       Home Medications    Prior to Admission medications   Medication Sig Start Date End Date Taking? Authorizing Provider  albuterol (PROVENTIL HFA;VENTOLIN HFA) 108 (90 Base) MCG/ACT inhaler Inhale 2 puffs into the lungs every 4 (four) hours as needed for wheezing or shortness of breath. 05/14/16   Lysbeth Penner, [name]  calcium carbonate (TUMS - DOSED IN MG ELEMENTAL CALCIUM) 500 MG chewable tablet Chew 2 tablets by mouth 2 (two) times daily as needed for indigestion or heartburn.     [provider]  famotidine (PEPCID) 10 MG tablet Take 1 tablet (10 mg total) by mouth at bedtime. 07/05/17   Alma Friendly, MD  metoCLOPramide (REGLAN) 10 MG tablet Take 1 tablet (10 mg total) by mouth at bedtime. 07/05/17   Alma Friendly, MD  multivitamin (RENA-VIT) TABS tablet Take 1 tablet by mouth daily.    [provider]  ondansetron (ZOFRAN) 4 MG tablet Take 1 tablet (4 mg total) by mouth every 12 (twelve) hours. 07/05/17   Alma Friendly, MD  pantoprazole  (PROTONIX) 40 MG tablet Take 1 tablet (40 mg total) by mouth daily before breakfast. 07/06/17   Alma Friendly, MD  sevelamer carbonate (RENVELA) 2.4 G PACK Take 2.4 g by mouth 3 (three) times daily with meals.     [provider]    Family History Family History  Problem Relation Age of Onset  . Hypertension Mother   . Diabetes Father   . Kidney Stones Sister   . Diabetes Maternal Grandmother   . Liver cancer Maternal Uncle     Social History Social History   Tobacco Use  . Smoking status: Current Every Day Smoker    Packs/day: 0.10    Years: 16.00    Pack years: 1.60    Types: Cigarettes    Last attempt to quit: 12/17/2014    Years since quitting: 2.5  . Smokeless tobacco: Never Used  Substance Use Topics  . Alcohol use: No    Alcohol/week: 0.0 oz  . Drug use: No     Allergies   Dilaudid [hydromorphone hcl]  Review of Systems Review of Systems All other systems reviewed and are negative except that which was mentioned in HPI   Physical Exam Updated Vital Signs BP 136/67 (BP Location: Right Arm)   Pulse 100   Temp 98.5 F (36.9 C) (Oral)   Resp 16   SpO2 98%   Physical Exam  Constitutional: He is oriented to person, place, and time. He appears well-developed and well-nourished. No distress.  HENT:  Head: Normocephalic and atraumatic.  Moist mucous membranes  Eyes: Conjunctivae are normal.  Neck: Neck supple.  Cardiovascular: Normal rate and regular rhythm.  Murmur heard. Pulmonary/Chest: Effort normal and breath sounds normal.  Abdominal: Soft. Bowel sounds are normal. He exhibits no distension. There is tenderness (mild LUQ).  Musculoskeletal: He exhibits no edema or tenderness.  Fistula L forearm with lines in place, no active bleeding; no focal leg tenderness or significant swelling  Neurological: He is alert and oriented to person, place, and time.  Fluent speech  Skin: Skin is warm and dry.  Psychiatric: He has a normal mood and  affect. Judgment normal.  Nursing note and vitals reviewed.    ED Treatments / Results  Labs (all labs ordered are listed, but only abnormal results are displayed) Labs Reviewed  BASIC METABOLIC PANEL - Abnormal; Notable for the following components:      Result Value   Potassium 3.2 (*)    Chloride 95 (*)    Glucose, Bld 120 (*)    BUN 22 (*)    Creatinine, Ser 8.46 (*)    Calcium 8.3 (*)    GFR calc non Af Amer 7 (*)    GFR calc Af Amer 9 (*)    Anion gap 16 (*)    All other components within normal limits    EKG  EKG Interpretation None       Radiology No results found.  Procedures Procedures (including critical care time)  Medications Ordered in ED Medications  diazepam (VALIUM) tablet 2 mg (2 mg Oral Given 07/09/17 1422)     Initial Impression / Assessment and Plan / ED Course  I have reviewed the triage vital signs and the nursing notes.  Pertinent labs that were available during my care of the patient were reviewed by me and considered in my medical decision making (see chart for details).     Pt sent from dialysis after he developed cramps in his legs halfway through the session.  He was comfortable on my exam with normal vital signs, normal O2 saturation on room air.  He had no focal leg tenderness or swelling to suggest infectious process or DVT.  I suspect his symptoms may have been related to fluid shifts and electrolyte changes during dialysis.  His symptoms have already started to improve without intervention.  Obtained BMP  to check potassium level and gave small dose of valium for muscle cramps. Had IV team de-access fistula.  K 3.2, remainder of labs reassuring.  Patient resting comfortably and improved on reassessment.  Because he has no other complaints I feel he is safe for discharge and he has no evidence of volume overload to necessitate emergent dialysis.  Instructed to follow-up with his dialysis clinic and outpatient providers.  Return  precautions given and patient discharged in satisfactory condition.  Final Clinical Impressions(s) / ED Diagnoses   Final diagnoses:  Leg cramps    ED Discharge Orders    None       Little, Wenda [place], MD 07/09/17 1616

## 2017-07-10 LAB — I-STAT CHEM 8, ED
BUN: 28 mg/dL — ABNORMAL HIGH (ref 6–20)
CALCIUM ION: 0.85 mmol/L — AB (ref 1.15–1.40)
Chloride: 99 mmol/L — ABNORMAL LOW (ref 101–111)
Creatinine, Ser: 10.7 mg/dL — ABNORMAL HIGH (ref 0.61–1.24)
Glucose, Bld: 103 mg/dL — ABNORMAL HIGH (ref 65–99)
HEMATOCRIT: 37 % — AB (ref 39.0–52.0)
HEMOGLOBIN: 12.6 g/dL — AB (ref 13.0–17.0)
Potassium: 3.4 mmol/L — ABNORMAL LOW (ref 3.5–5.1)
SODIUM: 139 mmol/L (ref 135–145)
TCO2: 22 mmol/L (ref 22–32)

## 2017-07-10 MED ORDER — CALCIUM CARBONATE ANTACID 500 MG PO CHEW
400.0000 mg | CHEWABLE_TABLET | Freq: Once | ORAL | Status: AC
  Administered 2017-07-10: 400 mg via ORAL
  Filled 2017-07-10: qty 2

## 2017-07-10 NOTE — ED Provider Notes (Signed)
12:12 AM Patient seen in conjunction with C[place] [name].   Patient seen earlier today for muscle cramps.  Electrolytes were reasonable at that time with mild hypocalcemia.   Returns with continued symptoms.  Patient uncomfortable appearing.  Pain is mainly in his right lower back ionize calcium found to be 0.85.  Discussed with Dr. Leonides Schanz.  Will treat with IM benzodiazepine and oral calcium as symptoms are mild.  Anticipate discharge to home.  BP (!) 144/77 (BP Location: Right Arm)   Pulse (!) 109   Temp 97.9 F (36.6 C) (Oral)   Resp 18   SpO2 96%     Carlisle Cater, [name] 07/10/17 [place], Delice [place], DO 07/10/17 [phone]

## 2017-07-10 NOTE — Discharge Instructions (Signed)
Please follow up with your doctor within 1 week as discussed. Return to the ED if you experience any worsening muscle spasms, chest pain, palpitations, or SOB.

## 2017-07-10 NOTE — ED Notes (Signed)
Pt verbalized understanding of d/c instructions and has no further questions. Pt is stable, A&Ox4, VSS.  

## 2017-08-05 ENCOUNTER — Encounter (HOSPITAL_COMMUNITY): Payer: Self-pay | Admitting: Emergency Medicine

## 2017-08-05 ENCOUNTER — Emergency Department (HOSPITAL_COMMUNITY)
Admission: EM | Admit: 2017-08-05 | Discharge: 2017-08-05 | Disposition: A | Discharge status: Home / Self Care | Payer: Medicare Other | Attending: Emergency Medicine | Admitting: Emergency Medicine

## 2017-08-05 DIAGNOSIS — F17210 Nicotine dependence, cigarettes, uncomplicated: Secondary | ICD-10-CM | POA: Diagnosis not present

## 2017-08-05 DIAGNOSIS — Z885 Allergy status to narcotic agent status: Secondary | ICD-10-CM | POA: Diagnosis not present

## 2017-08-05 DIAGNOSIS — R197 Diarrhea, unspecified: Secondary | ICD-10-CM | POA: Diagnosis not present

## 2017-08-05 DIAGNOSIS — Z79899 Other long term (current) drug therapy: Secondary | ICD-10-CM | POA: Diagnosis not present

## 2017-08-05 DIAGNOSIS — R109 Unspecified abdominal pain: Secondary | ICD-10-CM | POA: Insufficient documentation

## 2017-08-05 DIAGNOSIS — R112 Nausea with vomiting, unspecified: Secondary | ICD-10-CM

## 2017-08-05 DIAGNOSIS — N186 End stage renal disease: Secondary | ICD-10-CM

## 2017-08-05 DIAGNOSIS — I120 Hypertensive chronic kidney disease with stage 5 chronic kidney disease or end stage renal disease: Secondary | ICD-10-CM | POA: Insufficient documentation

## 2017-08-05 DIAGNOSIS — Z992 Dependence on renal dialysis: Secondary | ICD-10-CM | POA: Diagnosis not present

## 2017-08-05 DIAGNOSIS — G8929 Other chronic pain: Secondary | ICD-10-CM | POA: Diagnosis not present

## 2017-08-05 DIAGNOSIS — N185 Chronic kidney disease, stage 5: Secondary | ICD-10-CM | POA: Insufficient documentation

## 2017-08-05 LAB — COMPREHENSIVE METABOLIC PANEL
ALBUMIN: 4.4 g/dL (ref 3.5–5.0)
ALK PHOS: 61 U/L (ref 38–126)
ALT: 19 U/L (ref 17–63)
ANION GAP: 19 — AB (ref 5–15)
AST: 17 U/L (ref 15–41)
BUN: 29 mg/dL — AB (ref 6–20)
CALCIUM: 9.5 mg/dL (ref 8.9–10.3)
CO2: 26 mmol/L (ref 22–32)
Chloride: 94 mmol/L — ABNORMAL LOW (ref 101–111)
Creatinine, Ser: 9.89 mg/dL — ABNORMAL HIGH (ref 0.61–1.24)
GFR calc Af Amer: 7 mL/min — ABNORMAL LOW (ref >60)
GFR calc non Af Amer: 6 mL/min — ABNORMAL LOW (ref >60)
GLUCOSE: 104 mg/dL — AB (ref 65–99)
POTASSIUM: 3.1 mmol/L — AB (ref 3.5–5.1)
SODIUM: 139 mmol/L (ref 135–145)
Total Bilirubin: 1.3 mg/dL — ABNORMAL HIGH (ref 0.3–1.2)
Total Protein: 8.6 g/dL — ABNORMAL HIGH (ref 6.5–8.1)

## 2017-08-05 LAB — CBC
HEMATOCRIT: 38 % — AB (ref 39.0–52.0)
HEMOGLOBIN: 13 g/dL (ref 13.0–17.0)
MCH: 29.9 pg (ref 26.0–34.0)
MCHC: 34.2 g/dL (ref 30.0–36.0)
MCV: 87.4 fL (ref 78.0–100.0)
Platelets: 262 10*3/uL (ref 150–400)
RBC: 4.35 MIL/uL (ref 4.22–5.81)
RDW: 14.5 % (ref 11.5–15.5)
WBC: 11.6 10*3/uL — ABNORMAL HIGH (ref 4.0–10.5)

## 2017-08-05 LAB — LIPASE, BLOOD: Lipase: 27 U/L (ref 11–51)

## 2017-08-05 MED ORDER — ONDANSETRON 4 MG PO TBDP
4.0000 mg | ORAL_TABLET | Freq: Once | ORAL | Status: AC
  Administered 2017-08-05: 4 mg via ORAL
  Filled 2017-08-05: qty 1

## 2017-08-05 MED ORDER — ONDANSETRON 4 MG PO TBDP: 4.0000 mg | ORAL_TABLET | Freq: Three times a day (TID) | ORAL | 0 refills | Status: DC | PRN

## 2017-08-05 MED ORDER — HALOPERIDOL LACTATE 5 MG/ML IJ SOLN
2.0000 mg | Freq: Once | INTRAMUSCULAR | Status: AC
  Administered 2017-08-05: 2 mg via INTRAMUSCULAR
  Filled 2017-08-05: qty 1

## 2017-08-05 NOTE — Discharge Instructions (Signed)
Please follow up with your nephrologist and GI doctor regarding your ED visit and symptoms today.   I have given you a prescription for Zofran that dissolves in your mouth.  Please do not take the zofran pills while taking the dissolving zofran.

## 2017-08-05 NOTE — ED Triage Notes (Signed)
Patient c/o N/V with abdominal pain since yesterday after dialysis. Denies diarrhea.

## 2017-08-05 NOTE — ED Notes (Signed)
Benjamine Mola, [name] informed of pt's b/p.  Received verbal order to recheck.

## 2017-08-05 NOTE — ED Provider Notes (Signed)
[facility] DEPT Provider Note   CSN: [id number] Arrival date & time: 08/05/17  1222     History   Chief Complaint Chief Complaint  Patient presents with  . Emesis    HPI [name] is a 32 y.o. male with a history of morbid obesity, ESRD on dialysis Monday Wednesday Friday, GERD,chronic abdominal pain, who presents today for evaluation of abdominal pain, nausea, and vomiting.  He has been seen previously for this multiple times with extensive workups in both the emergency room and by GI without cause for his symptoms found.  He reports that the symptoms tend to occur most often after dialysis.  He reports compliance with his dialysis and went yesterday, and last night began experiencing diffuse belly pain, nausea, and has vomited 3 times.  He reports that earlier this morning he attempted to take his Zofran tablets, along with omeprazole which he vomited back up.  At around 3 PM he was able to take Reglan 10 mg pill which he kept down.  He reports that he has not vomited since, however continues to have abdominal pain.  He reports that normally when he has these pains morphine and Haldol helped him.  He reports significant relief from Haldol with this.   He denies any melana, no rectal bleeding.  He has had one episode of loose stools.  Denies fevers or chills.  He denies any marijuana use.  HPI  Past Medical History:  Diagnosis Date  . Arthritis    "all over" (11/19/2016)  . Chronic lower back pain   . Complication of anesthesia    A little while to wake up after knee surgery in 2008  . Dizziness    when coming off of dialysis  . ESRD (end stage renal disease) on dialysis [facility])    MWF and goes to [facility] (11/19/2016)  . Family history of anesthesia complication    mom slow to wake up  . GERD (gastroesophageal reflux disease)    takes Omeprazole as needed  . OJJKKXFG(182.9)    "monthly" (11/19/2016)  . History of hiatal hernia   .  Hyperparathyroidism ([place])   . Hypertension   . Joint pain   . Joint swelling   . Morbid obesity ([place])   . PONV (postoperative nausea and vomiting)   . Renal insufficiency    Pt is on dyalisis and has been for 14 years    Patient Active Problem List   Diagnosis Date Noted  . Abdominal pain 07/04/2017  . Gastritis and gastroduodenitis 11/19/2016  . Increased anion gap metabolic acidosis 93/71/6967  . Intractable abdominal pain   . Common bile duct dilatation   . Cholelithiasis without cholecystitis   . Intractable nausea and vomiting 10/08/2016  . Hematemesis 08/14/2016  . GERD (gastroesophageal reflux disease) 08/14/2016  . Essential hypertension 08/14/2016  . GIB (gastrointestinal bleeding) 08/14/2016  . SIRS (systemic inflammatory response syndrome) ([place]) 08/14/2016  . CKD (chronic kidney disease) stage V requiring chronic dialysis ([place]) 06/11/2016  . Non-intractable vomiting with nausea 06/11/2016  . Hepatic steatosis 06/11/2016  . Cholelithiasis 06/11/2016  . Localized osteoarthritis of left knee 07/11/2015  . Abdominal pain, epigastric 12/05/2014  . Lapband April 2016 11/27/2014  . Arthritis of knee 03/27/2014  . Morbid obesity, weight 291, BMI - 47 02/15/2014  . Hyperparathyroidism, secondary ([place]) 03/14/2013    Past Surgical History:  Procedure Laterality Date  . AV FISTULA PLACEMENT Bilateral 2005,2007   "right; left"  . AV FISTULA REPAIR  Right 2007   "tried to clean it out; couldn't do it"  . BREATH TEK H PYLORI N/A 05/15/2014   Procedure: BREATH TEK H PYLORI;  Surgeon: Alphonsa Overall, MD;  Location: [facility];  Service: General;  Laterality: N/A;  . CHOLECYSTECTOMY N/A 11/23/2016   Procedure: LAPAROSCOPIC CHOLECYSTECTOMY WITH INTRAOPERATIVE CHOLANGIOGRAM;  Surgeon: Erroll Luna, MD;  Location: [place];  Service: General;  Laterality: N/A;  . ESOPHAGOGASTRODUODENOSCOPY N/A 12/07/2014   Procedure: ESOPHAGOGASTRODUODENOSCOPY (EGD);  Surgeon: Alphonsa Overall, MD;   Location: [facility] ENDOSCOPY;  Service: General;  Laterality: N/A;  . ESOPHAGOGASTRODUODENOSCOPY N/A 12/17/2014   Procedure: ESOPHAGOGASTRODUODENOSCOPY (EGD);  Surgeon: Alphonsa Overall, MD;  Location: [place];  Service: General;  Laterality: N/A;  . ESOPHAGOGASTRODUODENOSCOPY N/A 08/16/2016   Procedure: ESOPHAGOGASTRODUODENOSCOPY (EGD);  Surgeon: Jerene Bears, MD;  Location: [place];  Service: Gastroenterology;  Laterality: N/A;  . ESOPHAGOGASTRODUODENOSCOPY (EGD) WITH PROPOFOL N/A 07/05/2017   Procedure: ESOPHAGOGASTRODUODENOSCOPY (EGD) WITH PROPOFOL;  Surgeon: Milus Banister, MD;  Location: [facility] ENDOSCOPY;  Service: Endoscopy;  Laterality: N/A;  . HERNIA REPAIR  11/2014   w/lap band OR  . JOINT REPLACEMENT    . KNEE ARTHROSCOPY Left 2007  . LAPAROSCOPIC GASTRIC BANDING N/A 11/12/2014   Procedure: LAPAROSCOPIC GASTRIC BANDING;  Surgeon: Alphonsa Overall, MD;  Location: [facility];  Service: General;  Laterality: N/A;  . PARATHYROIDECTOMY N/A 03/30/2013   Procedure: TOTAL PARATHYROIDECTOMY WITH AUTOTRANSPLANT;  Surgeon: Earnstine Regal, MD;  Location: [facility];  Service: General;  Laterality: N/A;  Autotransplant to left lower arm.  Marland Kitchen TOTAL KNEE ARTHROPLASTY Right 03/27/2014   Procedure: UNICOMPARTMENTAL ARTHROPLASTY;  Surgeon: Meredith Pel, MD;  Location: [place];  Service: Orthopedics;  Laterality: Right;       Home Medications    Prior to Admission medications   Medication Sig Start Date End Date Taking? Authorizing Provider  albuterol (PROVENTIL HFA;VENTOLIN HFA) 108 (90 Base) MCG/ACT inhaler Inhale 2 puffs into the lungs every 4 (four) hours as needed for wheezing or shortness of breath. 05/14/16   Lysbeth Penner, [name]  calcium carbonate (TUMS - DOSED IN MG ELEMENTAL CALCIUM) 500 MG chewable tablet Chew 2 tablets by mouth 2 (two) times daily as needed for indigestion or heartburn.     [provider]  famotidine (PEPCID) 10 MG tablet Take 1 tablet (10 mg total) by mouth at bedtime. 07/05/17    Alma Friendly, MD  metoCLOPramide (REGLAN) 10 MG tablet Take 1 tablet (10 mg total) by mouth at bedtime. 07/05/17   Alma Friendly, MD  multivitamin (RENA-VIT) TABS tablet Take 1 tablet by mouth daily.    [provider]  ondansetron (ZOFRAN ODT) 4 MG disintegrating tablet Take 1 tablet (4 mg total) by mouth every 8 (eight) hours as needed for nausea or vomiting. 08/05/17   Lorin Glass, [name]  ondansetron (ZOFRAN) 4 MG tablet Take 1 tablet (4 mg total) by mouth every 12 (twelve) hours. 07/05/17   Alma Friendly, MD  pantoprazole (PROTONIX) 40 MG tablet Take 1 tablet (40 mg total) by mouth daily before breakfast. 07/06/17   Alma Friendly, MD  sevelamer carbonate (RENVELA) 2.4 G PACK Take 2.4 g by mouth 3 (three) times daily with meals.     [provider]    Family History Family History  Problem Relation Age of Onset  . Hypertension Mother   . Diabetes Father   . Kidney Stones Sister   . Diabetes Maternal Grandmother   . Liver cancer Maternal Uncle  Social History Social History   Tobacco Use  . Smoking status: Current Every Day Smoker    Packs/day: 0.10    Years: 16.00    Pack years: 1.60    Types: Cigarettes    Last attempt to quit: 12/17/2014    Years since quitting: 2.6  . Smokeless tobacco: Never Used  Substance Use Topics  . Alcohol use: No    Alcohol/week: 0.0 oz  . Drug use: No     Allergies   Dilaudid [hydromorphone hcl]   Review of Systems Review of Systems  Constitutional: Negative for chills and fever.  HENT: Negative for ear pain and sore throat.   Eyes: Negative for pain and visual disturbance.  Respiratory: Negative for cough and shortness of breath.   Cardiovascular: Negative for chest pain and palpitations.  Gastrointestinal: Positive for abdominal pain, diarrhea, nausea and vomiting. Negative for abdominal distention, anal bleeding, blood in stool and constipation.  Genitourinary: Positive for  decreased urine volume (Baseline per patient). Negative for dysuria and hematuria.  Musculoskeletal: Negative for arthralgias and back pain.  Skin: Negative for color change and rash.  Neurological: Negative for seizures, syncope and headaches.  All other systems reviewed and are negative.    Physical Exam Updated Vital Signs BP (!) 103/57 (BP Location: Right Arm)   Pulse 92   Temp 98.1 F (36.7 C) (Oral)   Resp 17   SpO2 98%   Physical Exam  Constitutional: He appears well-developed and well-nourished. He does not appear ill. No distress.  HENT:  Head: Normocephalic and atraumatic.  Eyes: Conjunctivae are normal.  Neck: Neck supple.  Cardiovascular: Normal rate and regular rhythm.  No murmur heard. Pulses:      Posterior tibial pulses are 2+ on the right side, and 2+ on the left side.  Pulmonary/Chest: Effort normal and breath sounds normal. No respiratory distress.  Abdominal: Soft. Bowel sounds are normal. He exhibits no distension. There is generalized tenderness. There is no rigidity, no rebound and no guarding.  Very mild diffuse tenderness to palpation.    Musculoskeletal: He exhibits no edema.  Neurological: He is alert.  Skin: Skin is warm and dry. He is not diaphoretic.  Psychiatric: He has a normal mood and affect.  Nursing note and vitals reviewed.    ED Treatments / Results  Labs (all labs ordered are listed, but only abnormal results are displayed) Labs Reviewed  COMPREHENSIVE METABOLIC PANEL - Abnormal; Notable for the following components:      Result Value   Potassium 3.1 (*)    Chloride 94 (*)    Glucose, Bld 104 (*)    BUN 29 (*)    Creatinine, Ser 9.89 (*)    Total Protein 8.6 (*)    Total Bilirubin 1.3 (*)    GFR calc non Af Amer 6 (*)    GFR calc Af Amer 7 (*)    Anion gap 19 (*)    All other components within normal limits  CBC - Abnormal; Notable for the following components:   WBC 11.6 (*)    HCT 38.0 (*)    All other components  within normal limits  LIPASE, BLOOD    EKG  EKG Interpretation None       Radiology No results found.  Procedures Procedures (including critical care time)  Medications Ordered in ED Medications  ondansetron (ZOFRAN-ODT) disintegrating tablet 4 mg (4 mg Oral Given 08/05/17 1824)  haloperidol lactate (HALDOL) injection 2 mg (2 mg Intramuscular Given 08/05/17 1824)  Initial Impression / Assessment and Plan / ED Course  I have reviewed the triage vital signs and the nursing notes.  Pertinent labs & imaging results that were available during my care of the patient were reviewed by me and considered in my medical decision making (see chart for details).    Patient is nontoxic, nonseptic appearing, in no apparent distress.  Patient's pain and other symptoms adequately managed in emergency department.  Fluid bolus given.  Labs, imaging and vitals reviewed.  Patient does not meet the SIRS or Sepsis criteria.  On repeat exam patient does not have a surgical abdomen and there are no peritoneal signs.  Patient has a history of similar abdominal pain with nausea and vomiting and diarrhea after dialysis sessions.  He reports that this episode is unchanged from his previous events.  He has previously been evaluated for this by GI, with a CT abdomen pelvis most recently obtained 07/04/17 when he was having a similar episode, being admitted by GI with EGD and further workup without significant cause for patient's pain.    Chart review shows that in the past year says his 15th visit for nausea, vomiting, and abdominal pain.   He was treated with Zofran ODT, and Haldol IM with p.o. fluids.  This resulted in significant resolution of his symptoms and he wished to be discharged home.  He was instructed that he needs to follow-up with his nephrologist and gastroenterologist regarding his ED visit and symptoms today.   BP was borderline low, however he was afebrile, not tachycardic, and labs consistent with  his baseline.  He was instructed to go to dialysis tomorrow.  Suspect that BP secondary to mild dehydration given improved after being rehydrated with PO fluids.  Instructed to continue PO hydration.  He has had patient discharged home with symptomatic treatment and given strict instructions for follow-up with their primary care physician.  Zofran rx changed from pills to ODT formulation.  Instructed not to take both.  I have also discussed reasons to return immediately to the ER.  Patient expresses understanding and agrees with plan.   Final Clinical Impressions(s) / ED Diagnoses   Final diagnoses:  Nausea vomiting and diarrhea  Chronic abdominal pain  CKD (chronic kidney disease) stage V requiring chronic dialysis [facility])    ED Discharge Orders        Ordered    ondansetron (ZOFRAN ODT) 4 MG disintegrating tablet  Every 8 hours PRN     08/05/17 2036       Lorin Glass, [name] 08/06/17 0145    Milton Ferguson, MD 08/08/17 [phone]

## 2017-08-05 NOTE — ED Notes (Signed)
Benjamine Mola, [name] informed of b/p, 103/57.   Per Benjamine Mola, pt may d/c home.

## 2017-08-17 ENCOUNTER — Other Ambulatory Visit: Payer: Self-pay

## 2017-08-17 ENCOUNTER — Ambulatory Visit: Payer: Self-pay | Admitting: Gastroenterology

## 2017-10-31 ENCOUNTER — Ambulatory Visit (HOSPITAL_COMMUNITY)
Admission: EM | Admit: 2017-10-31 | Discharge: 2017-10-31 | Disposition: A | Discharge status: Home / Self Care | Payer: Medicare Other

## 2017-10-31 ENCOUNTER — Encounter (HOSPITAL_COMMUNITY): Payer: Self-pay | Admitting: Family Medicine

## 2017-10-31 NOTE — ED Triage Notes (Signed)
Pt here for right shoulder pain since last week. Denies injury. sts that he has been taking some muscle relaxer for relief.

## 2017-11-02 ENCOUNTER — Encounter (HOSPITAL_COMMUNITY): Payer: Self-pay | Admitting: Emergency Medicine

## 2017-11-02 ENCOUNTER — Ambulatory Visit (HOSPITAL_COMMUNITY)
Admission: EM | Admit: 2017-11-02 | Discharge: 2017-11-02 | Disposition: A | Discharge status: Home / Self Care | Payer: Medicare Other | Attending: Family Medicine | Admitting: Family Medicine

## 2017-11-02 DIAGNOSIS — M25512 Pain in left shoulder: Secondary | ICD-10-CM | POA: Diagnosis not present

## 2017-11-02 DIAGNOSIS — M25511 Pain in right shoulder: Secondary | ICD-10-CM | POA: Diagnosis not present

## 2017-11-02 MED ORDER — PREDNISONE 20 MG PO TABS: 40.0000 mg | ORAL_TABLET | Freq: Every day | ORAL | 0 refills | Status: AC

## 2017-11-02 NOTE — ED Triage Notes (Signed)
Pt here for bilateral shoulder pain x 2 [place]

## 2017-11-02 NOTE — Discharge Instructions (Signed)
Light and regular activity, see exercises provided. Tylenol as needed for pain. 5 days of prednisone. Ice application at end of day. Please follow up with your primary care provider for recheck of symptoms in the next 1-2 [place].

## 2017-11-02 NOTE — ED Provider Notes (Signed)
[facility]    CSN: [id number] Arrival date & time: 11/02/17  0957     History   Chief Complaint Chief Complaint  Patient presents with  . Shoulder Pain    HPI [name] is a 32 y.o. male.   [name] presents with bilateral shoulder pain which started 1 week ago. No exacerbating factors or injury. Denies numbness or tingling to arms or hands. At times radiates up to neck. Worse with raising of the arm. Left shoulder is worse. He is left handed. States two days ago he did have an episode where he felt nausea and his vision felt blurred for approximately 2-3 minutes which then resolved. He has not experienced this since then. States he has been taking muscle relaxer at night which helps with sleep but has not helped with pain. He is on dialysis M-W-F due to CKD from htn. Without diabetes. Hx of arthritis, low back pain, gerd, htn.    ROS per HPI.      Past Medical History:  Diagnosis Date  . Arthritis    "all over" (11/19/2016)  . Chronic lower back pain   . Complication of anesthesia    A little while to wake up after knee surgery in 2008  . Dizziness    when coming off of dialysis  . ESRD (end stage renal disease) on dialysis [facility])    MWF and goes to [facility] (11/19/2016)  . Family history of anesthesia complication    mom slow to wake up  . GERD (gastroesophageal reflux disease)    takes Omeprazole as needed  . RCBULAGT(364.6)    "monthly" (11/19/2016)  . History of hiatal hernia   . Hyperparathyroidism ([place])   . Hypertension   . Joint pain   . Joint swelling   . Morbid obesity ([place])   . PONV (postoperative nausea and vomiting)   . Renal insufficiency    Pt is on dyalisis and has been for 14 years    Patient Active Problem List   Diagnosis Date Noted  . Abdominal pain 07/04/2017  . Gastritis and gastroduodenitis 11/19/2016  . Increased anion gap metabolic acidosis 80/32/1224  . Intractable abdominal pain   . Common bile duct dilatation     . Cholelithiasis without cholecystitis   . Intractable nausea and vomiting 10/08/2016  . Hematemesis 08/14/2016  . GERD (gastroesophageal reflux disease) 08/14/2016  . Essential hypertension 08/14/2016  . GIB (gastrointestinal bleeding) 08/14/2016  . SIRS (systemic inflammatory response syndrome) ([place]) 08/14/2016  . CKD (chronic kidney disease) stage V requiring chronic dialysis ([place]) 06/11/2016  . Non-intractable vomiting with nausea 06/11/2016  . Hepatic steatosis 06/11/2016  . Cholelithiasis 06/11/2016  . Localized osteoarthritis of left knee 07/11/2015  . Abdominal pain, epigastric 12/05/2014  . Lapband April 2016 11/27/2014  . Arthritis of knee 03/27/2014  . Morbid obesity, weight 291, BMI - 47 02/15/2014  . Hyperparathyroidism, secondary ([place]) 03/14/2013    Past Surgical History:  Procedure Laterality Date  . AV FISTULA PLACEMENT Bilateral 2005,2007   "right; left"  . AV FISTULA REPAIR Right 2007   "tried to clean it out; couldn't do it"  . BREATH TEK H PYLORI N/A 05/15/2014   Procedure: BREATH TEK H PYLORI;  Surgeon: Alphonsa Overall, MD;  Location: [facility];  Service: General;  Laterality: N/A;  . CHOLECYSTECTOMY N/A 11/23/2016   Procedure: LAPAROSCOPIC CHOLECYSTECTOMY WITH INTRAOPERATIVE CHOLANGIOGRAM;  Surgeon: Erroll Luna, MD;  Location: [place];  Service: General;  Laterality: N/A;  . ESOPHAGOGASTRODUODENOSCOPY N/A  12/07/2014   Procedure: ESOPHAGOGASTRODUODENOSCOPY (EGD);  Surgeon: Alphonsa Overall, MD;  Location: [facility] ENDOSCOPY;  Service: General;  Laterality: N/A;  . ESOPHAGOGASTRODUODENOSCOPY N/A 12/17/2014   Procedure: ESOPHAGOGASTRODUODENOSCOPY (EGD);  Surgeon: Alphonsa Overall, MD;  Location: [place];  Service: General;  Laterality: N/A;  . ESOPHAGOGASTRODUODENOSCOPY N/A 08/16/2016   Procedure: ESOPHAGOGASTRODUODENOSCOPY (EGD);  Surgeon: Jerene Bears, MD;  Location: [place];  Service: Gastroenterology;  Laterality: N/A;  . ESOPHAGOGASTRODUODENOSCOPY (EGD) WITH PROPOFOL N/A  07/05/2017   Procedure: ESOPHAGOGASTRODUODENOSCOPY (EGD) WITH PROPOFOL;  Surgeon: Milus Banister, MD;  Location: [facility] ENDOSCOPY;  Service: Endoscopy;  Laterality: N/A;  . HERNIA REPAIR  11/2014   w/lap band OR  . JOINT REPLACEMENT    . KNEE ARTHROSCOPY Left 2007  . LAPAROSCOPIC GASTRIC BANDING N/A 11/12/2014   Procedure: LAPAROSCOPIC GASTRIC BANDING;  Surgeon: Alphonsa Overall, MD;  [facility];  Service: General;  Laterality: N/A;  . PARATHYROIDECTOMY N/A 03/30/2013   Procedure: TOTAL PARATHYROIDECTOMY WITH AUTOTRANSPLANT;  Surgeon: Earnstine Regal, MD;  Location: [place];  Service: General;  Laterality: N/A;  Autotransplant to left lower arm.  Marland Kitchen TOTAL KNEE ARTHROPLASTY Right 03/27/2014   Procedure: UNICOMPARTMENTAL ARTHROPLASTY;  Surgeon: Meredith Pel, MD;  Location: [place];  Service: Orthopedics;  Laterality: Right;       Home Medications    Prior to Admission medications   Medication Sig Start Date End Date Taking? Authorizing Provider  albuterol (PROVENTIL HFA;VENTOLIN HFA) 108 (90 Base) MCG/ACT inhaler Inhale 2 puffs into the lungs every 4 (four) hours as needed for wheezing or shortness of breath. 05/14/16   Lysbeth Penner, [name]  calcium carbonate (TUMS - DOSED IN MG ELEMENTAL CALCIUM) 500 MG chewable tablet Chew 2 tablets by mouth 2 (two) times daily as needed for indigestion or heartburn.     [provider]  famotidine (PEPCID) 10 MG tablet Take 1 tablet (10 mg total) by mouth at bedtime. 07/05/17   Alma Friendly, MD  metoCLOPramide (REGLAN) 10 MG tablet Take 1 tablet (10 mg total) by mouth at bedtime. 07/05/17   Alma Friendly, MD  multivitamin (RENA-VIT) TABS tablet Take 1 tablet by mouth daily.    [provider]  ondansetron (ZOFRAN ODT) 4 MG disintegrating tablet Take 1 tablet (4 mg total) by mouth every 8 (eight) hours as needed for nausea or vomiting. 08/05/17   Lorin Glass, [name]  ondansetron (ZOFRAN) 4 MG tablet Take 1 tablet (4 mg  total) by mouth every 12 (twelve) hours. 07/05/17   Alma Friendly, MD  pantoprazole (PROTONIX) 40 MG tablet Take 1 tablet (40 mg total) by mouth daily before breakfast. 07/06/17   Alma Friendly, MD  predniSONE (DELTASONE) 20 MG tablet Take 2 tablets (40 mg total) by mouth daily with breakfast for 5 days. 11/02/17 11/07/17  Zigmund Gottron, NP  sevelamer carbonate (RENVELA) 2.4 G PACK Take 2.4 g by mouth 3 (three) times daily with meals.     [provider]    Family History Family History  Problem Relation Age of Onset  . Hypertension Mother   . Diabetes Father   . Kidney Stones Sister   . Diabetes Maternal Grandmother   . Liver cancer Maternal Uncle     Social History Social History   Tobacco Use  . Smoking status: Current Every Day Smoker    Packs/day: 0.10    Years: 16.00    Pack years: 1.60    Types: Cigarettes    Last attempt to  quit: 12/17/2014    Years since quitting: 2.8  . Smokeless tobacco: Never Used  Substance Use Topics  . Alcohol use: No    Alcohol/week: 0.0 oz  . Drug use: No     Allergies   Dilaudid [hydromorphone hcl]   Review of Systems Review of Systems   Physical Exam Triage Vital Signs ED Triage Vitals  Enc Vitals Group     BP 11/02/17 1008 (!) 105/49     Pulse Rate 11/02/17 1008 74     Resp 11/02/17 1008 16     Temp 11/02/17 1008 (!) 97.5 F (36.4 C)     Temp Source 11/02/17 1008 Oral     SpO2 11/02/17 1008 100 %     Weight --      Height --      Head Circumference --      Peak Flow --      Pain Score 11/02/17 1010 8     Pain Loc --      Pain Edu? --      Excl. in [place]? --    No data found.  Updated Vital Signs BP (!) 105/49 (BP Location: Right Arm)   Pulse 74   Temp (!) 97.5 F (36.4 C) (Oral)   Resp 16   SpO2 100%   Visual Acuity Right Eye Distance:   Left Eye Distance:   Bilateral Distance:    Right Eye Near:   Left Eye Near:    Bilateral Near:     Physical Exam  Constitutional: He is oriented  to person, place, and time. He appears well-developed and well-nourished.  Cardiovascular: Normal rate and regular rhythm.  Pulmonary/Chest: Effort normal and breath sounds normal.  Musculoskeletal:       Right shoulder: He exhibits decreased range of motion, tenderness, bony tenderness and pain. He exhibits no swelling, no effusion, no crepitus, no deformity, no laceration, no spasm, normal pulse and normal strength.       Left shoulder: He exhibits decreased range of motion, tenderness, bony tenderness and pain. He exhibits no swelling, no effusion, no crepitus, no deformity, no laceration, no spasm, normal pulse and normal strength.  Bilateral shoulders with generalized tenderness; pain with any overhead motion but noted full ROM, slowly performed due to discomfort; mild pain with ac joint compression; sensation intact; strength equal to bilateral upper extremities; cap refill <2 seconds; without neck tenderness and with full ROm to neck  Neurological: He is alert and oriented to person, place, and time.  Skin: Skin is warm and dry.     UC Treatments / Results  Labs (all labs ordered are listed, but only abnormal results are displayed) Labs Reviewed - No data to display  EKG None Radiology No results found.  Procedures Procedures (including critical care time)  Medications Ordered in UC Medications - No data to display   Initial Impression / Assessment and Plan / UC Course  I have reviewed the triage vital signs and the nursing notes.  Pertinent labs & imaging results that were available during my care of the patient were reviewed by me and considered in my medical decision making (see chart for details).     Bilateral shoulder pain without injury. Hx CKD. Provided 5 days of prednisone at this time. Discussed arthritis vs cervical radiculopathy vs strain. Encouraged light range of motion as tolerated. Tylenol as needed. To continue to follow up with primary care provider as may  need further evaluation and management if persists Patient verbalized  understanding and agreeable to plan.    Final Clinical Impressions(s) / UC Diagnoses   Final diagnoses:  Acute pain of both shoulders    ED Discharge Orders        Ordered    predniSONE (DELTASONE) 20 MG tablet  Daily with breakfast     11/02/17 1028       Controlled Substance Prescriptions [place] Controlled Substance Registry consulted? Not Applicable   Zigmund Gottron, NP 11/02/17 1036

## 2017-11-30 ENCOUNTER — Ambulatory Visit (HOSPITAL_COMMUNITY)
Admission: EM | Admit: 2017-11-30 | Discharge: 2017-11-30 | Disposition: A | Discharge status: Home / Self Care | Payer: Medicare Other | Attending: Family Medicine | Admitting: Family Medicine

## 2017-11-30 ENCOUNTER — Ambulatory Visit (INDEPENDENT_AMBULATORY_CARE_PROVIDER_SITE_OTHER): Payer: Medicare Other

## 2017-11-30 ENCOUNTER — Encounter (HOSPITAL_COMMUNITY): Payer: Self-pay | Admitting: Emergency Medicine

## 2017-11-30 DIAGNOSIS — M436 Torticollis: Secondary | ICD-10-CM | POA: Diagnosis not present

## 2017-11-30 DIAGNOSIS — S63641A Sprain of metacarpophalangeal joint of right thumb, initial encounter: Secondary | ICD-10-CM | POA: Diagnosis not present

## 2017-11-30 MED ORDER — PREDNISONE 20 MG PO TABS: ORAL_TABLET | ORAL | 0 refills | Status: DC

## 2017-11-30 NOTE — ED Provider Notes (Signed)
[place]   [id number] 11/30/17 Arrival Time: 0814   SUBJECTIVE:  [name] is a 32 y.o. male who presents to the urgent care with complaint of right thumb pain and neck pain.  He fell about a week ago injuring his right thumb and it has been sore in the M CP joint ever since.  He seen no swelling.  Patient also has neck pain for the last 2 days.  He suffered no injury that he can recall.  He finds that if he turns his neck extremely to the left or hyper extends his neck, he gets a sharp pain.  There is no radiation to the pain.  Patient has no weakness in either upper extremity.  He has 15-year history of renal failure and dialysis.  His dialysis site is on the left forearm and he is right-handed.     Past Medical History:  Diagnosis Date  . Arthritis    "all over" (11/19/2016)  . Chronic lower back pain   . Complication of anesthesia    A little while to wake up after knee surgery in 2008  . Dizziness    when coming off of dialysis  . ESRD (end stage renal disease) on dialysis [facility])    MWF and goes to [facility] (11/19/2016)  . Family history of anesthesia complication    mom slow to wake up  . GERD (gastroesophageal reflux disease)    takes Omeprazole as needed  . GYJEHUDJ(497.0)    "monthly" (11/19/2016)  . History of hiatal hernia   . Hyperparathyroidism ([place])   . Hypertension   . Joint pain   . Joint swelling   . Morbid obesity ([place])   . PONV (postoperative nausea and vomiting)   . Renal insufficiency    Pt is on dyalisis and has been for 14 years   Family History  Problem Relation Age of Onset  . Hypertension Mother   . Diabetes Father   . Kidney Stones Sister   . Diabetes Maternal Grandmother   . Liver cancer Maternal Uncle    Social History   Socioeconomic History  . Marital status: Single    Spouse name: Not on file  . Number of children: 0  . Years of education: Not on file  . Highest education level: Not on file  Occupational  History  . Not on file  Social Needs  . Financial resource strain: Not on file  . Food insecurity:    Worry: Not on file    Inability: Not on file  . Transportation needs:    Medical: Not on file    Non-medical: Not on file  Tobacco Use  . Smoking status: Current Every Day Smoker    Packs/day: 0.10    Years: 16.00    Pack years: 1.60    Types: Cigarettes    Last attempt to quit: 12/17/2014    Years since quitting: 2.9  . Smokeless tobacco: Never Used  Substance and Sexual Activity  . Alcohol use: No    Alcohol/week: 0.0 oz  . Drug use: No  . Sexual activity: Yes  Lifestyle  . Physical activity:    Days per week: Not on file    Minutes per session: Not on file  . Stress: Not on file  Relationships  . Social connections:    Talks on phone: Not on file    Gets together: Not on file    Attends religious service: Not on file    Active  member of club or organization: Not on file    Attends meetings of clubs or organizations: Not on file    Relationship status: Not on file  . Intimate partner violence:    Fear of current or ex partner: Not on file    Emotionally abused: Not on file    Physically abused: Not on file    Forced sexual activity: Not on file  Other Topics Concern  . Not on file  Social History Narrative  . Not on file   No outpatient medications have been marked as taking for the 11/30/17 encounter [facility] Encounter).   Allergies  Allergen Reactions  . Dilaudid [Hydromorphone Hcl] Other (See Comments)    Patient became hypoxic to 60% with dilaudid 1mg  IV      ROS: As per HPI, remainder of ROS negative.   OBJECTIVE:   Vitals:   11/30/17 1618  BP: 107/63  Pulse: 82  Resp: 18  Temp: 98.5 F (36.9 C)  SpO2: 100%     General appearance: alert; no distress Eyes: PERRL; EOMI; conjunctiva normal HENT: normocephalic; atraumatic; ; oral mucosa normal Neck: supple; sharp pain with extreme left rotation or neck hyperextension.  Mild tenderness of  the left neck base Back: no CVA tenderness Extremities: no cyanosis or edema; symmetrical with no gross deformities; left forearm fistula; right MCP joint of thumb is tender at the joint line especially with radial collateral ligament stress; no soft tissue swelling Skin: warm and dry; no ecchymosis Neurologic: normal gait; grossly normal Psychological: alert and cooperative; normal mood and affect      Labs:  Results for orders placed or performed during the hospital encounter of 08/05/17  Lipase, blood  Result Value Ref Range   Lipase 27 11 - 51 U/L  Comprehensive metabolic panel  Result Value Ref Range   Sodium 139 135 - 145 mmol/L   Potassium 3.1 (L) 3.5 - 5.1 mmol/L   Chloride 94 (L) 101 - 111 mmol/L   CO2 26 22 - 32 mmol/L   Glucose, Bld 104 (H) 65 - 99 mg/dL   BUN 29 (H) 6 - 20 mg/dL   Creatinine, Ser 9.89 (H) 0.61 - 1.24 mg/dL   Calcium 9.5 8.9 - 10.3 mg/dL   Total Protein 8.6 (H) 6.5 - 8.1 g/dL   Albumin 4.4 3.5 - 5.0 g/dL   AST 17 15 - 41 U/L   ALT 19 17 - 63 U/L   Alkaline Phosphatase 61 38 - 126 U/L   Total Bilirubin 1.3 (H) 0.3 - 1.2 mg/dL   GFR calc non Af Amer 6 (L) >60 mL/min   GFR calc Af Amer 7 (L) >60 mL/min   Anion gap 19 (H) 5 - 15  CBC  Result Value Ref Range   WBC 11.6 (H) 4.0 - 10.5 K/uL   RBC 4.35 4.22 - 5.81 MIL/uL   Hemoglobin 13.0 13.0 - 17.0 g/dL   HCT 38.0 (L) 39.0 - 52.0 %   MCV 87.4 78.0 - 100.0 fL   MCH 29.9 26.0 - 34.0 pg   MCHC 34.2 30.0 - 36.0 g/dL   RDW 14.5 11.5 - 15.5 %   Platelets 262 150 - 400 K/uL    Labs Reviewed - No data to display  No results found.     ASSESSMENT & PLAN:  1. Sprain of metacarpophalangeal (MCP) joint of right thumb, initial encounter   2. Torticollis   Wear the forearm thumb spica for 1 week.  Meds ordered this encounter  Medications  . predniSONE (DELTASONE) 20 MG tablet    Sig: One daily with food    Dispense:  5 tablet    Refill:  0    Reviewed expectations re: course of current  medical issues. Questions answered. Outlined signs and symptoms indicating need for more acute intervention. Patient verbalized understanding. After Visit Summary given.    Procedures:      Robyn Haber, MD 11/30/17 [phone]

## 2017-11-30 NOTE — ED Triage Notes (Signed)
Pt c/o soreness in his neck, pt also states he jammed his finger, R thumb,  on Tuesday.

## 2018-08-04 ENCOUNTER — Encounter (HOSPITAL_COMMUNITY): Payer: Self-pay | Admitting: Emergency Medicine

## 2018-08-04 ENCOUNTER — Ambulatory Visit (INDEPENDENT_AMBULATORY_CARE_PROVIDER_SITE_OTHER): Payer: Medicare Other

## 2018-08-04 ENCOUNTER — Ambulatory Visit (HOSPITAL_COMMUNITY)
Admission: EM | Admit: 2018-08-04 | Discharge: 2018-08-04 | Disposition: A | Discharge status: Home / Self Care | Payer: Medicare Other | Attending: Family Medicine | Admitting: Family Medicine

## 2018-08-04 ENCOUNTER — Other Ambulatory Visit: Payer: Self-pay

## 2018-08-04 DIAGNOSIS — M25561 Pain in right knee: Secondary | ICD-10-CM | POA: Diagnosis not present

## 2018-08-04 MED ORDER — PREDNISONE 20 MG PO TABS: 20.0000 mg | ORAL_TABLET | Freq: Two times a day (BID) | ORAL | 0 refills | Status: DC

## 2018-08-04 NOTE — ED Provider Notes (Signed)
[place]    CSN: [id number] Arrival date & time: 08/04/18  1240     History   Chief Complaint Chief Complaint  Patient presents with  . Knee Pain    HPI [name] is a 33 y.o. male.   HPI  Patient is here for right knee pain.  He had a partial knee replacement in 2015.  He has not has any x-ray in years.  He states that he has acute knee pain starting yesterday.  He cannot put weight on it comfortably.  He cannot fully straighten or extend it.  He has some swelling around the knee.  He had no trauma.  No fall.  No injury.  No overuse. He is obese.  He is a renal failure patient who goes to dialysis 3 days a week.  He is on multiple medications and states he is compliant with his care.  He complains of pain in the lateral knee   Past Medical History:  Diagnosis Date  . Arthritis    "all over" (11/19/2016)  . Chronic lower back pain   . Complication of anesthesia    A little while to wake up after knee surgery in 2008  . Dizziness    when coming off of dialysis  . ESRD (end stage renal disease) on dialysis [facility])    MWF and goes to [facility] (11/19/2016)  . Family history of anesthesia complication    mom slow to wake up  . GERD (gastroesophageal reflux disease)    takes Omeprazole as needed  . BTDVVOHY(073.7)    "monthly" (11/19/2016)  . History of hiatal hernia   . Hyperparathyroidism ([place])   . Hypertension   . Joint pain   . Joint swelling   . Morbid obesity ([place])   . PONV (postoperative nausea and vomiting)   . Renal insufficiency    Pt is on dyalisis and has been for 14 years    Patient Active Problem List   Diagnosis Date Noted  . Abdominal pain 07/04/2017  . Gastritis and gastroduodenitis 11/19/2016  . Increased anion gap metabolic acidosis 10/62/6948  . Intractable abdominal pain   . Common bile duct dilatation   . Cholelithiasis without cholecystitis   . Intractable nausea and vomiting 10/08/2016  . Hematemesis 08/14/2016  . GERD  (gastroesophageal reflux disease) 08/14/2016  . Essential hypertension 08/14/2016  . GIB (gastrointestinal bleeding) 08/14/2016  . SIRS (systemic inflammatory response syndrome) ([place]) 08/14/2016  . CKD (chronic kidney disease) stage V requiring chronic dialysis ([place]) 06/11/2016  . Non-intractable vomiting with nausea 06/11/2016  . Hepatic steatosis 06/11/2016  . Cholelithiasis 06/11/2016  . Localized osteoarthritis of left knee 07/11/2015  . Abdominal pain, epigastric 12/05/2014  . Lapband April 2016 11/27/2014  . Arthritis of knee 03/27/2014  . Morbid obesity, weight 291, BMI - 47 02/15/2014  . Hyperparathyroidism, secondary ([place]) 03/14/2013    Past Surgical History:  Procedure Laterality Date  . AV FISTULA PLACEMENT Bilateral 2005,2007   "right; left"  . AV FISTULA REPAIR Right 2007   "tried to clean it out; couldn't do it"  . BREATH TEK H PYLORI N/A 05/15/2014   Procedure: BREATH TEK H PYLORI;  Surgeon: Alphonsa Overall, MD;  Location: [facility];  Service: General;  Laterality: N/A;  . CHOLECYSTECTOMY N/A 11/23/2016   Procedure: LAPAROSCOPIC CHOLECYSTECTOMY WITH INTRAOPERATIVE CHOLANGIOGRAM;  Surgeon: Erroll Luna, MD;  Location: [place];  Service: General;  Laterality: N/A;  . ESOPHAGOGASTRODUODENOSCOPY N/A 12/07/2014   Procedure: ESOPHAGOGASTRODUODENOSCOPY (EGD);  Surgeon:  Alphonsa Overall, MD;  Location: [facility] ENDOSCOPY;  Service: General;  Laterality: N/A;  . ESOPHAGOGASTRODUODENOSCOPY N/A 12/17/2014   Procedure: ESOPHAGOGASTRODUODENOSCOPY (EGD);  Surgeon: Alphonsa Overall, MD;  Location: [place];  Service: General;  Laterality: N/A;  . ESOPHAGOGASTRODUODENOSCOPY N/A 08/16/2016   Procedure: ESOPHAGOGASTRODUODENOSCOPY (EGD);  Surgeon: Jerene Bears, MD;  Location: [place];  Service: Gastroenterology;  Laterality: N/A;  . ESOPHAGOGASTRODUODENOSCOPY (EGD) WITH PROPOFOL N/A 07/05/2017   Procedure: ESOPHAGOGASTRODUODENOSCOPY (EGD) WITH PROPOFOL;  Surgeon: Milus Banister, MD;  Location: [facility]  ENDOSCOPY;  Service: Endoscopy;  Laterality: N/A;  . HERNIA REPAIR  11/2014   w/lap band OR  . JOINT REPLACEMENT    . KNEE ARTHROSCOPY Left 2007  . LAPAROSCOPIC GASTRIC BANDING N/A 11/12/2014   Procedure: LAPAROSCOPIC GASTRIC BANDING;  Surgeon: Alphonsa Overall, MD;  Location: [facility];  Service: General;  Laterality: N/A;  . PARATHYROIDECTOMY N/A 03/30/2013   Procedure: TOTAL PARATHYROIDECTOMY WITH AUTOTRANSPLANT;  Surgeon: Earnstine Regal, MD;  Location: [place];  Service: General;  Laterality: N/A;  Autotransplant to left lower arm.  Marland Kitchen TOTAL KNEE ARTHROPLASTY Right 03/27/2014   Procedure: UNICOMPARTMENTAL ARTHROPLASTY;  Surgeon: Meredith Pel, MD;  Location: [place];  Service: Orthopedics;  Laterality: Right;       Home Medications    Prior to Admission medications   Medication Sig Start Date End Date Taking? Authorizing Provider  famotidine (PEPCID) 10 MG tablet Take 1 tablet (10 mg total) by mouth at bedtime. 07/05/17  Yes Alma Friendly, MD  multivitamin (RENA-VIT) TABS tablet Take 1 tablet by mouth daily.   Yes [provider]  pantoprazole (PROTONIX) 40 MG tablet Take 1 tablet (40 mg total) by mouth daily before breakfast. 07/06/17  Yes Alma Friendly, MD  sevelamer carbonate (RENVELA) 2.4 G PACK Take 2.4 g by mouth 3 (three) times daily with meals.    Yes [provider]  albuterol (PROVENTIL HFA;VENTOLIN HFA) 108 (90 Base) MCG/ACT inhaler Inhale 2 puffs into the lungs every 4 (four) hours as needed for wheezing or shortness of breath. 05/14/16   Lysbeth Penner, [name]  calcium carbonate (TUMS - DOSED IN MG ELEMENTAL CALCIUM) 500 MG chewable tablet Chew 2 tablets by mouth 2 (two) times daily as needed for indigestion or heartburn.     [provider]  metoCLOPramide (REGLAN) 10 MG tablet Take 1 tablet (10 mg total) by mouth at bedtime. 07/05/17   Alma Friendly, MD  ondansetron (ZOFRAN ODT) 4 MG disintegrating tablet Take 1 tablet (4 mg total) by  mouth every 8 (eight) hours as needed for nausea or vomiting. 08/05/17   Lorin Glass, [name]  predniSONE (DELTASONE) 20 MG tablet Take 1 tablet (20 mg total) by mouth 2 (two) times daily with a meal. 08/04/18   Raylene Everts, MD    Family History Family History  Problem Relation Age of Onset  . Hypertension Mother   . Diabetes Father   . Kidney Stones Sister   . Diabetes Maternal Grandmother   . Liver cancer Maternal Uncle     Social History Social History   Tobacco Use  . Smoking status: Current Every Day Smoker    Packs/day: 0.10    Years: 16.00    Pack years: 1.60    Types: Cigarettes    Last attempt to quit: 12/17/2014    Years since quitting: 3.6  . Smokeless tobacco: Never Used  Substance Use Topics  . Alcohol use: No    Alcohol/week: 0.0 standard drinks  .  Drug use: No     Allergies   Dilaudid [hydromorphone hcl]   Review of Systems Review of Systems  Constitutional: Negative for chills and fever.  HENT: Negative for ear pain and sore throat.   Eyes: Negative for pain and visual disturbance.  Respiratory: Negative for cough and shortness of breath.   Cardiovascular: Negative for chest pain and palpitations.  Gastrointestinal: Negative for abdominal pain and vomiting.  Genitourinary: Negative for dysuria and hematuria.  Musculoskeletal: Positive for arthralgias, gait problem and joint swelling. Negative for back pain.  Skin: Negative for color change and rash.  Neurological: Negative for seizures and syncope.  All other systems reviewed and are negative.    Physical Exam Triage Vital Signs ED Triage Vitals  Enc Vitals Group     BP 08/04/18 1418 (!) 114/49     Pulse Rate 08/04/18 1418 77     Resp 08/04/18 1418 16     Temp 08/04/18 1418 98.1 F (36.7 C)     Temp Source 08/04/18 1418 Oral     SpO2 08/04/18 1418 100 %     Weight --      Height --      Head Circumference --      Peak Flow --      Pain Score 08/04/18 1417 9     Pain Loc --        Pain Edu? --      Excl. in [place]? --    No data found.  Updated Vital Signs BP (!) 114/49   Pulse 77   Temp 98.1 F (36.7 C) (Oral)   Resp 16   SpO2 100%       Physical Exam Constitutional:      General: He is not in acute distress.    Appearance: He is well-developed. He is obese.     Comments: In wheelchair  HENT:     Head: Normocephalic and atraumatic.  Eyes:     Conjunctiva/sclera: Conjunctivae normal.     Pupils: Pupils are equal, round, and reactive to light.  Neck:     Musculoskeletal: Normal range of motion.  Cardiovascular:     Rate and Rhythm: Normal rate.  Pulmonary:     Effort: Pulmonary effort is normal. No respiratory distress.  Abdominal:     General: There is no distension.     Palpations: Abdomen is soft.  Musculoskeletal: Normal range of motion.     Comments: Well-healed anterior scar right knee.  Tenderness in the lateral knee of  proximal fibula and lateral knee joint.  No visible deformity.  Appears uncomfortable.  Lacks full extension flexion.  No instability to examination.  Skin:    General: Skin is warm and dry.  Neurological:     Mental Status: He is alert.      UC Treatments / Results  Labs (all labs ordered are listed, but only abnormal results are displayed) Labs Reviewed - No data to display  EKG None  Radiology Dg Knee Complete 4 Views Right  Result Date: 08/04/2018 CLINICAL DATA:  Chronic right knee pain. EXAM: RIGHT KNEE - COMPLETE 4+ VIEW COMPARISON:  Radiographs dated 02/03/2016 FINDINGS: There is no fracture or dislocation or joint effusion. Previous unicompartmental arthroplasty of the lateral compartment. There is slight chronic lucency around the femoral and tibial components, unchanged around the tibial component and minimally more prominent around the femoral component. The area of irregularity of the medial femoral condyle seen on the prior study appears partially healed. IMPRESSION:  No acute abnormality. No significant  change since the prior study. Electronically Signed   By: Lorriane Shire M.D.   On: 08/04/2018 15:19    Procedures Procedures (including critical care time)  Medications Ordered in UC Medications - No data to display  Initial Impression / Assessment and Plan / UC Course  I have reviewed the triage vital signs and the nursing notes.  Pertinent labs & imaging results that were available during my care of the patient were reviewed by me and considered in my medical decision making (see chart for details).     Showed the patient his x-rays.  We discussed that he had some loosening of his partial knee replacement plate on the lateral femur.  This might be causing him some pain.  It is unchanged from prior x-rays.  Going to treat his inflammation with prednisone.  I feel this is the safest anti-inflammatory given his renal failure.  Will advise him to follow-up with his orthopedic surgeon next week Final Clinical Impressions(s) / UC Diagnoses   Final diagnoses:  Acute pain of right knee     Discharge Instructions     Take the prednisone as directed Limit walking while painful See Dr Marlou Sa in follow up / [facility]    ED Prescriptions    Medication Sig [place]. Provider   predniSONE (DELTASONE) 20 MG tablet Take 1 tablet (20 mg total) by mouth 2 (two) times daily with a meal. 10 tablet Raylene Everts, MD     Controlled Substance Prescriptions [place] Controlled Substance Registry consulted? Not Applicable   Raylene Everts, MD 08/05/18 1328

## 2018-08-04 NOTE — Discharge Instructions (Signed)
Take the prednisone as directed Limit walking while painful See Dr Marlou Sa in follow up / [facility]

## 2018-08-04 NOTE — ED Triage Notes (Signed)
PT reports right knee pain that started yesterday. No injury. His. Of arthritis and partial knee replacement on that side.

## 2018-08-08 ENCOUNTER — Telehealth (INDEPENDENT_AMBULATORY_CARE_PROVIDER_SITE_OTHER): Payer: Self-pay | Admitting: Orthopedic Surgery

## 2018-08-08 NOTE — Telephone Encounter (Signed)
Returned call to patient left message to return call to schedule appointment with Dr. Marlou Sa

## 2018-08-22 ENCOUNTER — Ambulatory Visit (INDEPENDENT_AMBULATORY_CARE_PROVIDER_SITE_OTHER): Payer: Medicare Other | Admitting: Orthopedic Surgery

## 2018-08-22 ENCOUNTER — Encounter (INDEPENDENT_AMBULATORY_CARE_PROVIDER_SITE_OTHER): Payer: Self-pay | Admitting: Orthopedic Surgery

## 2018-08-22 DIAGNOSIS — M25561 Pain in right knee: Secondary | ICD-10-CM

## 2018-08-22 NOTE — Progress Notes (Signed)
Office Visit Note   Patient: [name]           Date of Birth: 10/29/1985           MRN: [id number] Visit Date: 08/22/2018 Requested by: Nolene Ebbs, MD [address] [place], [place] [address] PCP: Nolene Ebbs, MD  Subjective: Chief Complaint  Patient presents with  . Right Knee - Pain    03/27/2014 Right Knee Uni    HPI: [name] is a 33 year old patient with right knee pain.  Describes about 2 [place] history of right knee pain which is improved after being on a prednisone taper.  Reports lateral sided pain.  Describes some giving way.  Has start up stiffness after prolonged rest.  He is working at [employer].  He has significant valgus alignment in both knees.  Had partial knee replacement done about 5 years ago.  Was doing well with that until about 2 Prest ago.              ROS: All systems reviewed are negative as they relate to the chief complaint within the history of present illness.  Patient denies  fevers or chills.   Assessment & Plan: Visit Diagnoses:  1. Acute pain of right knee     Plan: Impression is right knee pain with what could be loose femoral component.  Plan is bone scan.  No evidence of infection at this time.  He may have to be converted to a total knee prosthesis.  We would definitely try to save the PCL in that event and use press-fit if possible.  I will see him back after that study  Follow-Up Instructions: No follow-ups on file.   Orders:  Orders Placed This Encounter  Procedures  . NM Bone Scan 3 Phase Lower Extremity   No orders of the defined types were placed in this encounter.     Procedures: No procedures performed   Clinical Data: No additional findings.  Objective: Vital Signs: There were no vitals taken for this visit.  Physical Exam:   Constitutional: Patient appears well-developed HEENT:  Head: Normocephalic Eyes:EOM are normal Neck: Normal range of motion Cardiovascular: Normal rate Pulmonary/chest: Effort  normal Neurologic: Patient is alert Skin: Skin is warm Psychiatric: Patient has normal mood and affect    Ortho Exam: Ortho exam demonstrates full active and passive range of motion of that right knee with no effusion.  Has valgus alignment bilaterally.  No warmth to the knee.  Extensor mechanism is intact.  Specialty Comments:  No specialty comments available.  Imaging: No results found.   PMFS History: Patient Active Problem List   Diagnosis Date Noted  . Abdominal pain 07/04/2017  . Gastritis and gastroduodenitis 11/19/2016  . Increased anion gap metabolic acidosis 26/94/8546  . Intractable abdominal pain   . Common bile duct dilatation   . Cholelithiasis without cholecystitis   . Intractable nausea and vomiting 10/08/2016  . Hematemesis 08/14/2016  . GERD (gastroesophageal reflux disease) 08/14/2016  . Essential hypertension 08/14/2016  . GIB (gastrointestinal bleeding) 08/14/2016  . SIRS (systemic inflammatory response syndrome) ([place]) 08/14/2016  . CKD (chronic kidney disease) stage V requiring chronic dialysis ([place]) 06/11/2016  . Non-intractable vomiting with nausea 06/11/2016  . Hepatic steatosis 06/11/2016  . Cholelithiasis 06/11/2016  . Localized osteoarthritis of left knee 07/11/2015  . Abdominal pain, epigastric 12/05/2014  . Lapband April 2016 11/27/2014  . Arthritis of knee 03/27/2014  . Morbid obesity, weight 291, BMI - 47 02/15/2014  . Hyperparathyroidism,  secondary ([place]) 03/14/2013   Past Medical History:  Diagnosis Date  . Arthritis    "all over" (11/19/2016)  . Chronic lower back pain   . Complication of anesthesia    A little while to wake up after knee surgery in 2008  . Dizziness    when coming off of dialysis  . ESRD (end stage renal disease) on dialysis [facility])    MWF and goes to [facility] (11/19/2016)  . Family history of anesthesia complication    mom slow to wake up  . GERD (gastroesophageal reflux disease)    takes Omeprazole as needed   . ZOXWRUEA(540.9)    "monthly" (11/19/2016)  . History of hiatal hernia   . Hyperparathyroidism ([place])   . Hypertension   . Joint pain   . Joint swelling   . Morbid obesity ([place])   . PONV (postoperative nausea and vomiting)   . Renal insufficiency    Pt is on dyalisis and has been for 14 years    Family History  Problem Relation Age of Onset  . Hypertension Mother   . Diabetes Father   . Kidney Stones Sister   . Diabetes Maternal Grandmother   . Liver cancer Maternal Uncle     Past Surgical History:  Procedure Laterality Date  . AV FISTULA PLACEMENT Bilateral 2005,2007   "right; left"  . AV FISTULA REPAIR Right 2007   "tried to clean it out; couldn't do it"  . BREATH TEK H PYLORI N/A 05/15/2014   Procedure: BREATH TEK H PYLORI;  Surgeon: Alphonsa Overall, MD;  Location: [facility];  Service: General;  Laterality: N/A;  . CHOLECYSTECTOMY N/A 11/23/2016   Procedure: LAPAROSCOPIC CHOLECYSTECTOMY WITH INTRAOPERATIVE CHOLANGIOGRAM;  Surgeon: Erroll Luna, MD;  Location: [place];  Service: General;  Laterality: N/A;  . ESOPHAGOGASTRODUODENOSCOPY N/A 12/07/2014   Procedure: ESOPHAGOGASTRODUODENOSCOPY (EGD);  Surgeon: Alphonsa Overall, MD;  Location: [facility] ENDOSCOPY;  Service: General;  Laterality: N/A;  . ESOPHAGOGASTRODUODENOSCOPY N/A 12/17/2014   Procedure: ESOPHAGOGASTRODUODENOSCOPY (EGD);  Surgeon: Alphonsa Overall, MD;  Location: [place];  Service: General;  Laterality: N/A;  . ESOPHAGOGASTRODUODENOSCOPY N/A 08/16/2016   Procedure: ESOPHAGOGASTRODUODENOSCOPY (EGD);  Surgeon: Jerene Bears, MD;  Location: [place];  Service: Gastroenterology;  Laterality: N/A;  . ESOPHAGOGASTRODUODENOSCOPY (EGD) WITH PROPOFOL N/A 07/05/2017   Procedure: ESOPHAGOGASTRODUODENOSCOPY (EGD) WITH PROPOFOL;  Surgeon: Milus Banister, MD;  Location: [facility] ENDOSCOPY;  Service: Endoscopy;  Laterality: N/A;  . HERNIA REPAIR  11/2014   w/lap band OR  . JOINT REPLACEMENT    . KNEE ARTHROSCOPY Left 2007  . LAPAROSCOPIC GASTRIC  BANDING N/A 11/12/2014   Procedure: LAPAROSCOPIC GASTRIC BANDING;  Surgeon: Alphonsa Overall, MD;  Location: [facility];  Service: General;  Laterality: N/A;  . PARATHYROIDECTOMY N/A 03/30/2013   Procedure: TOTAL PARATHYROIDECTOMY WITH AUTOTRANSPLANT;  Surgeon: Earnstine Regal, MD;  Location: [place];  Service: General;  Laterality: N/A;  Autotransplant to left lower arm.  Marland Kitchen TOTAL KNEE ARTHROPLASTY Right 03/27/2014   Procedure: UNICOMPARTMENTAL ARTHROPLASTY;  Surgeon: Meredith Pel, MD;  Location: [place];  Service: Orthopedics;  Laterality: Right;   Social History   Occupational History  . Not on file  Tobacco Use  . Smoking status: Current Every Day Smoker    Packs/day: 0.10    Years: 16.00    Pack years: 1.60    Types: Cigarettes    Last attempt to quit: 12/17/2014    Years since quitting: 3.6  . Smokeless tobacco: Never Used  Substance and Sexual Activity  .  Alcohol use: No    Alcohol/week: 0.0 standard drinks  . Drug use: No  . Sexual activity: Yes

## 2018-10-20 ENCOUNTER — Ambulatory Visit (HOSPITAL_COMMUNITY)
Admission: EM | Admit: 2018-10-20 | Discharge: 2018-10-20 | Disposition: A | Discharge status: Home / Self Care | Payer: Medicare Other | Attending: Family Medicine | Admitting: Family Medicine

## 2018-10-20 ENCOUNTER — Other Ambulatory Visit: Payer: Self-pay

## 2018-10-20 ENCOUNTER — Encounter (HOSPITAL_COMMUNITY): Payer: Self-pay | Admitting: Emergency Medicine

## 2018-10-20 DIAGNOSIS — R21 Rash and other nonspecific skin eruption: Secondary | ICD-10-CM

## 2018-10-20 NOTE — ED Triage Notes (Signed)
Pt reports red spots on his body for the last few [place].  Pt states he has one on each shoulder, one on his back and one on his right chest.  All the spots are very small except the one on his chest.

## 2018-10-20 NOTE — ED Provider Notes (Signed)
[place]   [id number] 10/20/18 Arrival Time: 1117  ASSESSMENT & PLAN:  1. Rash and nonspecific skin eruption    Possible folliculitis though very mild. Prefers to watch and wait vs trial of antibiotics. Bathes daily. Ensure that he dries skin very well.  Will follow up with PCP or here if worsening or failing to improve as anticipated. Reviewed expectations re: course of current medical issues. Questions answered. Outlined signs and symptoms indicating need for more acute intervention. Patient verbalized understanding. After Visit Summary given.   SUBJECTIVE:  [name] is a 33 y.o. male who presents with a skin complaint. "Small little bumps" first noticed over the last 1-2 [place]. Location: torso and back mainly Associated pruritis? mild Associated pain? none Progression: "seem to come and go maybe"  Drainage? No  Known trigger? No  New soaps/lotions/topicals/detergents/environmental exposures? No Contacts with similar? No Recent travel? No  Other associated symptoms: none Therapies tried thus far: none Arthralgia or myalgia? none Recent illness? none Fever? none No specific aggravating or alleviating factors reported.  ROS: As per HPI.  OBJECTIVE: Vitals:   10/20/18 1133  BP: 103/66  Pulse: 99  Resp: 18  Temp: 98.4 F (36.9 C)  TempSrc: Oral  SpO2: 99%    General appearance: alert; no distress Lungs: clear to auscultation bilaterally Heart: regular rate and rhythm Extremities: no edema Skin: warm and dry; very few scattered papules over torso and back; all solitary and measuring approx 2-58mm each; small whiteheads; no areas of fluctuance; no active drainage Psychological: alert and cooperative; normal mood and affect  Allergies  Allergen Reactions  . Dilaudid [Hydromorphone Hcl] Other (See Comments)    Patient became hypoxic to 60% with dilaudid 1mg  IV    Past Medical History:  Diagnosis Date  . Arthritis    "all over"  (11/19/2016)  . Chronic lower back pain   . Complication of anesthesia    A little while to wake up after knee surgery in 2008  . Dizziness    when coming off of dialysis  . ESRD (end stage renal disease) on dialysis [facility])    MWF and goes to [facility] (11/19/2016)  . Family history of anesthesia complication    mom slow to wake up  . GERD (gastroesophageal reflux disease)    takes Omeprazole as needed  . RJJOACZY(606.3)    "monthly" (11/19/2016)  . History of hiatal hernia   . Hyperparathyroidism ([place])   . Hypertension   . Joint pain   . Joint swelling   . Morbid obesity ([place])   . PONV (postoperative nausea and vomiting)   . Renal insufficiency    Pt is on dyalisis and has been for 14 years   Social History   Socioeconomic History  . Marital status: Single    Spouse name: Not on file  . Number of children: 0  . Years of education: Not on file  . Highest education level: Not on file  Occupational History  . Not on file  Social Needs  . Financial resource strain: Not on file  . Food insecurity:    Worry: Not on file    Inability: Not on file  . Transportation needs:    Medical: Not on file    Non-medical: Not on file  Tobacco Use  . Smoking status: Current Every Day Smoker    Packs/day: 0.10    Years: 16.00    Pack years: 1.60    Types: Cigarettes    Last attempt  to quit: 12/17/2014    Years since quitting: 3.8  . Smokeless tobacco: Never Used  Substance and Sexual Activity  . Alcohol use: No    Alcohol/week: 0.0 standard drinks  . Drug use: No  . Sexual activity: Yes  Lifestyle  . Physical activity:    Days per week: Not on file    Minutes per session: Not on file  . Stress: Not on file  Relationships  . Social connections:    Talks on phone: Not on file    Gets together: Not on file    Attends religious service: Not on file    Active member of club or organization: Not on file    Attends meetings of clubs or organizations: Not on file    Relationship  status: Not on file  . Intimate partner violence:    Fear of current or ex partner: Not on file    Emotionally abused: Not on file    Physically abused: Not on file    Forced sexual activity: Not on file  Other Topics Concern  . Not on file  Social History Narrative  . Not on file   Family History  Problem Relation Age of Onset  . Hypertension Mother   . Diabetes Father   . Kidney Stones Sister   . Diabetes Maternal Grandmother   . Liver cancer Maternal Uncle    Past Surgical History:  Procedure Laterality Date  . AV FISTULA PLACEMENT Bilateral 2005,2007   "right; left"  . AV FISTULA REPAIR Right 2007   "tried to clean it out; couldn't do it"  . BREATH TEK H PYLORI N/A 05/15/2014   Procedure: BREATH TEK H PYLORI;  Surgeon: Alphonsa Overall, MD;  Location: [facility] ENDOSCOPY;  Service: General;  Laterality: N/A;  . CHOLECYSTECTOMY N/A 11/23/2016   Procedure: LAPAROSCOPIC CHOLECYSTECTOMY WITH INTRAOPERATIVE CHOLANGIOGRAM;  Surgeon: Erroll Luna, MD;  Location: [place];  Service: General;  Laterality: N/A;  . ESOPHAGOGASTRODUODENOSCOPY N/A 12/07/2014   Procedure: ESOPHAGOGASTRODUODENOSCOPY (EGD);  Surgeon: Alphonsa Overall, MD;  Location: [facility] ENDOSCOPY;  Service: General;  Laterality: N/A;  . ESOPHAGOGASTRODUODENOSCOPY N/A 12/17/2014   Procedure: ESOPHAGOGASTRODUODENOSCOPY (EGD);  Surgeon: Alphonsa Overall, MD;  Location: [place];  Service: General;  Laterality: N/A;  . ESOPHAGOGASTRODUODENOSCOPY N/A 08/16/2016   Procedure: ESOPHAGOGASTRODUODENOSCOPY (EGD);  Surgeon: Jerene Bears, MD;  Location: [place];  Service: Gastroenterology;  Laterality: N/A;  . ESOPHAGOGASTRODUODENOSCOPY (EGD) WITH PROPOFOL N/A 07/05/2017   Procedure: ESOPHAGOGASTRODUODENOSCOPY (EGD) WITH PROPOFOL;  Surgeon: Milus Banister, MD;  Location: [facility] ENDOSCOPY;  Service: Endoscopy;  Laterality: N/A;  . HERNIA REPAIR  11/2014   w/lap band OR  . JOINT REPLACEMENT    . KNEE ARTHROSCOPY Left 2007  . LAPAROSCOPIC GASTRIC BANDING N/A  11/12/2014   Procedure: LAPAROSCOPIC GASTRIC BANDING;  Surgeon: Alphonsa Overall, MD;  [facility];  Service: General;  Laterality: N/A;  . PARATHYROIDECTOMY N/A 03/30/2013   Procedure: TOTAL PARATHYROIDECTOMY WITH AUTOTRANSPLANT;  Surgeon: Earnstine Regal, MD;  Location: [place];  Service: General;  Laterality: N/A;  Autotransplant to left lower arm.  Marland Kitchen TOTAL KNEE ARTHROPLASTY Right 03/27/2014   Procedure: UNICOMPARTMENTAL ARTHROPLASTY;  Surgeon: Meredith Pel, MD;  Location: [place];  Service: Orthopedics;  Laterality: Right;     Vanessa Kick, MD 10/20/18 1157

## 2018-11-22 ENCOUNTER — Telehealth (INDEPENDENT_AMBULATORY_CARE_PROVIDER_SITE_OTHER): Payer: Self-pay | Admitting: Orthopedic Surgery

## 2018-11-22 NOTE — Telephone Encounter (Signed)
It looks like [facility] has tried to contact him, but they never followed back up with him again. I will shoot an email to April Pait to advise her of this and she will get her staff to call and try to set it up again.

## 2018-11-22 NOTE — Telephone Encounter (Signed)
Need scan first

## 2018-11-22 NOTE — Telephone Encounter (Signed)
Patient called requesting an RX to be sent to his Pharmacy for pain of his knee.  Patient uses [facility] on [address]  [phone].  Thank you.

## 2018-11-22 NOTE — Telephone Encounter (Signed)
Please advise. Patient request medication refill. Last seen in Jan Nuc Med study ordered but not yet completed. I ran narcotic search on him and on 2/11 and 01/31 received #20 Norco 10/325.  01/29 #20 Norco 5/325

## 2018-11-22 NOTE — Telephone Encounter (Signed)
Patient had bone scan ordered in Jan. Can you please let me know what happened. He is asking for pain medication and I wanted to make sure I knew what to tell Dr Marlou Sa regarding study. Thanks.

## 2018-11-23 NOTE — Telephone Encounter (Signed)
IC s/w patient and advised. He is going for scan next Tuesday.

## 2018-11-29 ENCOUNTER — Ambulatory Visit (HOSPITAL_COMMUNITY)
Admission: RE | Admit: 2018-11-29 | Discharge: 2018-11-29 | Disposition: A | Discharge status: Home / Self Care | Payer: Medicare Other | Source: Ambulatory Visit | Attending: Orthopedic Surgery | Admitting: Orthopedic Surgery

## 2018-11-29 ENCOUNTER — Other Ambulatory Visit (INDEPENDENT_AMBULATORY_CARE_PROVIDER_SITE_OTHER): Payer: Self-pay | Admitting: Orthopedic Surgery

## 2018-11-29 ENCOUNTER — Other Ambulatory Visit: Payer: Self-pay

## 2018-11-29 ENCOUNTER — Telehealth (INDEPENDENT_AMBULATORY_CARE_PROVIDER_SITE_OTHER): Payer: Self-pay

## 2018-11-29 DIAGNOSIS — M25561 Pain in right knee: Secondary | ICD-10-CM

## 2018-11-29 MED ORDER — TECHNETIUM TC 99M MEDRONATE IV KIT
18.5000 | PACK | Freq: Once | INTRAVENOUS | Status: AC | PRN
Start: 2018-11-29 — End: 2018-11-29
  Administered 2018-11-29: 13:00:00 18.5 via INTRAVENOUS

## 2018-11-29 NOTE — Telephone Encounter (Signed)
Patient is going to have a bone scan but there is no order. [name] would like for you to put order in for Bone scan  Right knee. Patient is there now. Would like an Epic order.

## 2018-11-29 NOTE — Telephone Encounter (Signed)
Gabriel Cirri made new order.

## 2018-12-01 ENCOUNTER — Telehealth (INDEPENDENT_AMBULATORY_CARE_PROVIDER_SITE_OTHER): Payer: Self-pay | Admitting: Orthopedic Surgery

## 2018-12-01 NOTE — Telephone Encounter (Signed)
pls advise. Thanks.  

## 2018-12-01 NOTE — Telephone Encounter (Signed)
Patient would like something for pain, he states the scan was completed on 11/29/18.

## 2018-12-02 MED ORDER — TRAMADOL HCL 50 MG PO TABS: 50.0000 mg | ORAL_TABLET | Freq: Two times a day (BID) | ORAL | 0 refills | Status: DC | PRN

## 2018-12-02 NOTE — Telephone Encounter (Signed)
Called to pharmacy 

## 2018-12-02 NOTE — Telephone Encounter (Signed)
Ok for tramadol 1 po q 12 # 35

## 2019-01-19 ENCOUNTER — Ambulatory Visit: Payer: Medicare Other | Admitting: Orthopedic Surgery

## 2019-01-25 ENCOUNTER — Other Ambulatory Visit: Payer: Self-pay | Admitting: Family

## 2019-01-25 ENCOUNTER — Ambulatory Visit (INDEPENDENT_AMBULATORY_CARE_PROVIDER_SITE_OTHER): Payer: Medicare Other

## 2019-01-25 ENCOUNTER — Ambulatory Visit (INDEPENDENT_AMBULATORY_CARE_PROVIDER_SITE_OTHER): Payer: Medicare Other | Admitting: Orthopedic Surgery

## 2019-01-25 ENCOUNTER — Other Ambulatory Visit: Payer: Self-pay

## 2019-01-25 ENCOUNTER — Encounter: Payer: Self-pay | Admitting: Orthopedic Surgery

## 2019-01-25 VITALS — Ht 66.0 in | Wt 308.0 lb

## 2019-01-25 DIAGNOSIS — M25562 Pain in left knee: Secondary | ICD-10-CM | POA: Diagnosis not present

## 2019-01-25 DIAGNOSIS — G8929 Other chronic pain: Secondary | ICD-10-CM | POA: Diagnosis not present

## 2019-01-25 MED ORDER — TRAMADOL HCL 50 MG PO TABS: 50.0000 mg | ORAL_TABLET | Freq: Every day | ORAL | 0 refills | Status: DC | PRN

## 2019-01-26 ENCOUNTER — Encounter: Payer: Self-pay | Admitting: Orthopedic Surgery

## 2019-01-26 NOTE — Progress Notes (Signed)
Office Visit Note   Patient: [name]           Date of Birth: [birth year]           MRN: [id number] Visit Date: 01/25/2019 Requested by: Nolene Ebbs, MD [id number] [place],  [place] [address] PCP: Nolene Ebbs, MD  Subjective: Chief Complaint  Patient presents with  . Left Knee - Pain    HPI: [place] is a patient with left knee pain for "a while".  Denies any history of injury.  Tried Voltaren gel without much help.  Had right unicompartmental knee replacement on the right-hand side which did well.  Tramadol helped but he finished the prescription.  He is on dialysis.  Reports stiffening and pop in his.  Symptoms have been going on now for about 4 [place].              ROS: All systems reviewed are negative as they relate to the chief complaint within the history of present illness.  Patient denies  fevers or chills.   Assessment & Plan: Visit Diagnoses:  1. Chronic pain of left knee     Plan: Impression is left knee pain with severe end-stage lateral compartment arthritis.  Plan is tramadol weight loss and conservative treatment measures.  He is at high risk for any type of joint replacement.  He is done well with the right unicompartmental knee replacement.  Could consider that on the left-hand side but I would want to try to delay that as much as possible.  He still does have enough laxity on the lateral side that we could consider replacement but it would be a potentially difficult proposition.  Follow-Up Instructions: Return if symptoms worsen or fail to improve.   Orders:  Orders Placed This Encounter  Procedures  . XR KNEE 3 VIEW LEFT   Meds ordered this encounter  Medications  . DISCONTD: traMADol (ULTRAM) 50 MG tablet    Sig: Take 1 tablet (50 mg total) by mouth daily as needed.    Dispense:  45 tablet    Refill:  0  . DISCONTD: traMADol (ULTRAM) 50 MG tablet    Sig: Take 1 tablet (50 mg total) by mouth daily as needed.    Dispense:  45 tablet   Refill:  0      Procedures: No procedures performed   Clinical Data: No additional findings.  Objective: Vital Signs: Ht 5\' 6"  (1.676 m)   Wt (!) 308 lb (139.7 kg)   BMI 49.71 kg/m   Physical Exam:   Constitutional: Patient appears well-developed HEENT:  Head: Normocephalic Eyes:EOM are normal Neck: Normal range of motion Cardiovascular: Normal rate Pulmonary/chest: Effort normal Neurologic: Patient is alert Skin: Skin is warm Psychiatric: Patient has normal mood and affect    Ortho Exam: Ortho exam demonstrates valgus alignment on the left less so on the right.  Mild effusion is present on the left-hand side.  Full extension and flexion past 90 is present.  Pedal pulses palpable.  He does have some degree of laxity to varus testing on the left-hand side indicating potential ability to put in that lateral unicompartmental knee replacement if needed.  Specialty Comments:  No specialty comments available.  Imaging: No results found.   PMFS History: Patient Active Problem List   Diagnosis Date Noted  . Abdominal pain 07/04/2017  . Gastritis and gastroduodenitis 11/19/2016  . Increased anion gap metabolic acidosis 89/21/1941  . Intractable abdominal pain   . Common bile duct  dilatation   . Cholelithiasis without cholecystitis   . Intractable nausea and vomiting 10/08/2016  . Hematemesis 08/14/2016  . GERD (gastroesophageal reflux disease) 08/14/2016  . Essential hypertension 08/14/2016  . GIB (gastrointestinal bleeding) 08/14/2016  . SIRS (systemic inflammatory response syndrome) ([place]) 08/14/2016  . CKD (chronic kidney disease) stage V requiring chronic dialysis ([place]) 06/11/2016  . Non-intractable vomiting with nausea 06/11/2016  . Hepatic steatosis 06/11/2016  . Cholelithiasis 06/11/2016  . Localized osteoarthritis of left knee 07/11/2015  . Abdominal pain, epigastric 12/05/2014  . Lapband April 2016 11/27/2014  . Arthritis of knee 03/27/2014  . Morbid  obesity, weight 291, BMI - 47 02/15/2014  . Hyperparathyroidism, secondary ([place]) 03/14/2013   Past Medical History:  Diagnosis Date  . Arthritis    "all over" (11/19/2016)  . Chronic lower back pain   . Complication of anesthesia    A little while to wake up after knee surgery in 2008  . Dizziness    when coming off of dialysis  . ESRD (end stage renal disease) on dialysis [facility])    MWF and goes to [facility] (11/19/2016)  . Family history of anesthesia complication    mom slow to wake up  . GERD (gastroesophageal reflux disease)    takes Omeprazole as needed  . RSWNIOEV(035.0)    "monthly" (11/19/2016)  . History of hiatal hernia   . Hyperparathyroidism ([place])   . Hypertension   . Joint pain   . Joint swelling   . Morbid obesity ([place])   . PONV (postoperative nausea and vomiting)   . Renal insufficiency    Pt is on dyalisis and has been for 14 years    Family History  Problem Relation Age of Onset  . Hypertension Mother   . Diabetes Father   . Kidney Stones Sister   . Diabetes Maternal Grandmother   . Liver cancer Maternal Uncle     Past Surgical History:  Procedure Laterality Date  . AV FISTULA PLACEMENT Bilateral 2005,2007   "right; left"  . AV FISTULA REPAIR Right 2007   "tried to clean it out; couldn't do it"  . BREATH TEK H PYLORI N/A 05/15/2014   Procedure: BREATH TEK H PYLORI;  Surgeon: Alphonsa Overall, MD;  Location: [facility] END[facility];  Service: General;  Laterality: N/A;  . CHOLECYSTECTOMY N/A 11/23/2016   Procedure: LAPAROSCOPIC CHOLECYSTECTOMY WITH INTRAOPERATIVE CHOLANGIOGRAM;  Surgeon: Erroll Luna, MD;  Location: [place];  Service: General;  Laterality: N/A;  . ESOPHAGOGASTRODUODENOSCOPY N/A 12/07/2014   Procedure: ESOPHAGOGASTRODUODENOSCOPY (EGD);  Surgeon: Alphonsa Overall, MD;  Location: [facility] ENDOSCOPY;  Service: General;  Laterality: N/A;  . ESOPHAGOGASTRODUODENOSCOPY N/A 12/17/2014   Procedure: ESOPHAGOGASTRODUODENOSCOPY (EGD);  Surgeon: Alphonsa Overall, MD;  Location:  [place];  Service: General;  Laterality: N/A;  . ESOPHAGOGASTRODUODENOSCOPY N/A 08/16/2016   Procedure: ESOPHAGOGASTRODUODENOSCOPY (EGD);  Surgeon: Jerene Bears, MD;  Location: [place];  Service: Gastroenterology;  Laterality: N/A;  . ESOPHAGOGASTRODUODENOSCOPY (EGD) WITH PROPOFOL N/A 07/05/2017   Procedure: ESOPHAGOGASTRODUODENOSCOPY (EGD) WITH PROPOFOL;  Surgeon: Milus Banister, MD;  Location: [facility] ENDOSCOPY;  Service: Endoscopy;  Laterality: N/A;  . HERNIA REPAIR  11/2014   w/lap band OR  . JOINT REPLACEMENT    . KNEE ARTHROSCOPY Left 2007  . LAPAROSCOPIC GASTRIC BANDING N/A 11/12/2014   Procedure: LAPAROSCOPIC GASTRIC BANDING;  Surgeon: Alphonsa Overall, MD;  Location: [facility];  Service: General;  Laterality: N/A;  . PARATHYROIDECTOMY N/A 03/30/2013   Procedure: TOTAL PARATHYROIDECTOMY WITH AUTOTRANSPLANT;  Surgeon: Earnstine Regal, MD;  Location:  [facility];  Service: General;  Laterality: N/A;  Autotransplant to left lower arm.  Marland Kitchen TOTAL KNEE ARTHROPLASTY Right 03/27/2014   Procedure: UNICOMPARTMENTAL ARTHROPLASTY;  Surgeon: Meredith Pel, MD;  Location: [place];  Service: Orthopedics;  Laterality: Right;   Social History   Occupational History  . Not on file  Tobacco Use  . Smoking status: Current Every Day Smoker    Packs/day: 0.10    Years: 16.00    Pack years: 1.60    Types: Cigarettes    Last attempt to quit: 12/17/2014    Years since quitting: 4.1  . Smokeless tobacco: Never Used  Substance and Sexual Activity  . Alcohol use: No    Alcohol/week: 0.0 standard drinks  . Drug use: No  . Sexual activity: Yes

## 2019-01-29 ENCOUNTER — Other Ambulatory Visit: Payer: Self-pay

## 2019-01-29 ENCOUNTER — Ambulatory Visit (HOSPITAL_COMMUNITY)
Admission: EM | Admit: 2019-01-29 | Discharge: 2019-01-29 | Disposition: A | Discharge status: Home / Self Care | Payer: Medicare Other | Attending: Emergency Medicine | Admitting: Emergency Medicine

## 2019-01-29 DIAGNOSIS — R1013 Epigastric pain: Secondary | ICD-10-CM | POA: Diagnosis not present

## 2019-01-29 DIAGNOSIS — R112 Nausea with vomiting, unspecified: Secondary | ICD-10-CM

## 2019-01-29 MED ORDER — ALUM & MAG HYDROXIDE-SIMETH 200-200-20 MG/5ML PO SUSP
30.0000 mL | Freq: Once | ORAL | Status: AC
  Administered 2019-01-29: 30 mL via ORAL

## 2019-01-29 MED ORDER — PANTOPRAZOLE SODIUM 40 MG PO TBEC: 40.0000 mg | DELAYED_RELEASE_TABLET | Freq: Every day | ORAL | 0 refills | Status: DC

## 2019-01-29 MED ORDER — ALUM & MAG HYDROXIDE-SIMETH 200-200-20 MG/5ML PO SUSP
ORAL | Status: AC
  Filled 2019-01-29: qty 30

## 2019-01-29 MED ORDER — LIDOCAINE VISCOUS HCL 2 % MT SOLN
OROMUCOSAL | Status: AC
  Filled 2019-01-29: qty 15

## 2019-01-29 MED ORDER — LIDOCAINE VISCOUS HCL 2 % MT SOLN
15.0000 mL | Freq: Once | OROMUCOSAL | Status: AC
  Administered 2019-01-29: 15 mL via ORAL

## 2019-01-29 MED ORDER — METOCLOPRAMIDE HCL 10 MG PO TABS: 5.0000 mg | ORAL_TABLET | Freq: Every evening | ORAL | 0 refills | Status: DC | PRN

## 2019-01-29 NOTE — ED Triage Notes (Signed)
Per pt he went to dialysis on Friday and then went to work. Pt started having abdominal cramping and diarrhea. No real appetite. . Pt said no vomiting, can drink only ginger ale and fruit.

## 2019-01-29 NOTE — ED Provider Notes (Signed)
[place]    CSN: [id number] Arrival date & time: 01/29/19  1643      History   Chief Complaint Chief Complaint  Patient presents with  . Abdominal Cramping    HPI [name] is a 33 y.o. male.   [name] presents with complaints of vomiting and diarrhea. Started two days ago after dialysis. States he got abdominal cramping followed by vomiting up clear fluid that night. Yesterday he felt somewhat better when he woke so ate, but then pain returned. He ate fruit. This morning he woke and felt better again, but then he at a sub sandwich and pain returned. States the pain does radiate to his back. He has been having loose stools as well, approximately 3-4 a day. States he does tend to have these, ever since his gallbladder was removed. States this feels similar to prior to gall bladder removal. He takes tums daily but no other antacid. He feels nausea after eating. No fevers. Has been drinking fluids normally. No blood in stool or vomit. No weakness or dizziness. No known ill contacts. Extensive medical history, per chart review has had issues with nausea and vomiting in the past, as well as gi bleeding and gerd.     ROS per HPI, negative if not otherwise mentioned.      Past Medical History:  Diagnosis Date  . Arthritis    "all over" (11/19/2016)  . Chronic lower back pain   . Complication of anesthesia    A little while to wake up after knee surgery in 2008  . Dizziness    when coming off of dialysis  . ESRD (end stage renal disease) on dialysis [facility])    MWF and goes to [facility] (11/19/2016)  . Family history of anesthesia complication    mom slow to wake up  . GERD (gastroesophageal reflux disease)    takes Omeprazole as needed  . ZJQBHALP(379.0)    "monthly" (11/19/2016)  . History of hiatal hernia   . Hyperparathyroidism ([place])   . Hypertension   . Joint pain   . Joint swelling   . Morbid obesity ([place])   . PONV (postoperative nausea and  vomiting)   . Renal insufficiency    Pt is on dyalisis and has been for 14 years    Patient Active Problem List   Diagnosis Date Noted  . Abdominal pain 07/04/2017  . Gastritis and gastroduodenitis 11/19/2016  . Increased anion gap metabolic acidosis 24/04/7352  . Intractable abdominal pain   . Common bile duct dilatation   . Cholelithiasis without cholecystitis   . Intractable nausea and vomiting 10/08/2016  . Hematemesis 08/14/2016  . GERD (gastroesophageal reflux disease) 08/14/2016  . Essential hypertension 08/14/2016  . GIB (gastrointestinal bleeding) 08/14/2016  . SIRS (systemic inflammatory response syndrome) ([place]) 08/14/2016  . CKD (chronic kidney disease) stage V requiring chronic dialysis ([place]) 06/11/2016  . Non-intractable vomiting with nausea 06/11/2016  . Hepatic steatosis 06/11/2016  . Cholelithiasis 06/11/2016  . Localized osteoarthritis of left knee 07/11/2015  . Abdominal pain, epigastric 12/05/2014  . Lapband April 2016 11/27/2014  . Arthritis of knee 03/27/2014  . Morbid obesity, weight 291, BMI - 47 02/15/2014  . Hyperparathyroidism, secondary ([place]) 03/14/2013    Past Surgical History:  Procedure Laterality Date  . AV FISTULA PLACEMENT Bilateral 2005,2007   "right; left"  . AV FISTULA REPAIR Right 2007   "tried to clean it out; couldn't do it"  . BREATH TEK H PYLORI N/A  05/15/2014   Procedure: BREATH TEK H PYLORI;  Surgeon: Alphonsa Overall, MD;  Location: [facility];  Service: General;  Laterality: N/A;  . CHOLECYSTECTOMY N/A 11/23/2016   Procedure: LAPAROSCOPIC CHOLECYSTECTOMY WITH INTRAOPERATIVE CHOLANGIOGRAM;  Surgeon: Erroll Luna, MD;  Location: [place];  Service: General;  Laterality: N/A;  . ESOPHAGOGASTRODUODENOSCOPY N/A 12/07/2014   Procedure: ESOPHAGOGASTRODUODENOSCOPY (EGD);  Surgeon: Alphonsa Overall, MD;  Location: [facility] ENDOSCOPY;  Service: General;  Laterality: N/A;  . ESOPHAGOGASTRODUODENOSCOPY N/A 12/17/2014   Procedure: ESOPHAGOGASTRODUODENOSCOPY  (EGD);  Surgeon: Alphonsa Overall, MD;  Location: [place];  Service: General;  Laterality: N/A;  . ESOPHAGOGASTRODUODENOSCOPY N/A 08/16/2016   Procedure: ESOPHAGOGASTRODUODENOSCOPY (EGD);  Surgeon: Jerene Bears, MD;  Location: [place];  Service: Gastroenterology;  Laterality: N/A;  . ESOPHAGOGASTRODUODENOSCOPY (EGD) WITH PROPOFOL N/A 07/05/2017   Procedure: ESOPHAGOGASTRODUODENOSCOPY (EGD) WITH PROPOFOL;  Surgeon: Milus Banister, MD;  Location: [facility] ENDOSCOPY;  Service: Endoscopy;  Laterality: N/A;  . HERNIA REPAIR  11/2014   w/lap band OR  . JOINT REPLACEMENT    . KNEE ARTHROSCOPY Left 2007  . LAPAROSCOPIC GASTRIC BANDING N/A 11/12/2014   Procedure: LAPAROSCOPIC GASTRIC BANDING;  Surgeon: Alphonsa Overall, MD;  Location: [facility];  Service: General;  Laterality: N/A;  . PARATHYROIDECTOMY N/A 03/30/2013   Procedure: TOTAL PARATHYROIDECTOMY WITH AUTOTRANSPLANT;  Surgeon: Earnstine Regal, MD;  Location: [place];  Service: General;  Laterality: N/A;  Autotransplant to left lower arm.  Marland Kitchen TOTAL KNEE ARTHROPLASTY Right 03/27/2014   Procedure: UNICOMPARTMENTAL ARTHROPLASTY;  Surgeon: Meredith Pel, MD;  Location: [place];  Service: Orthopedics;  Laterality: Right;       Home Medications    Prior to Admission medications   Medication Sig Start Date End Date Taking? Authorizing Provider  albuterol (PROVENTIL HFA;VENTOLIN HFA) 108 (90 Base) MCG/ACT inhaler Inhale 2 puffs into the lungs every 4 (four) hours as needed for wheezing or shortness of breath. 05/14/16   Lysbeth Penner, [name]  calcium carbonate (TUMS - DOSED IN MG ELEMENTAL CALCIUM) 500 MG chewable tablet Chew 2 tablets by mouth 2 (two) times daily as needed for indigestion or heartburn.     [provider]  famotidine (PEPCID) 10 MG tablet Take 1 tablet (10 mg total) by mouth at bedtime. 07/05/17   Alma Friendly, MD  metoCLOPramide (REGLAN) 10 MG tablet Take 0.5 tablets (5 mg total) by mouth at bedtime as needed for nausea or vomiting.  01/29/19   Zigmund Gottron, NP  multivitamin (RENA-VIT) TABS tablet Take 1 tablet by mouth daily.    [provider]  ondansetron (ZOFRAN ODT) 4 MG disintegrating tablet Take 1 tablet (4 mg total) by mouth every 8 (eight) hours as needed for nausea or vomiting. 08/05/17   Lorin Glass, [name]  pantoprazole (PROTONIX) 40 MG tablet Take 1 tablet (40 mg total) by mouth daily before breakfast. 01/29/19   Zigmund Gottron, NP  predniSONE (DELTASONE) 20 MG tablet Take 1 tablet (20 mg total) by mouth 2 (two) times daily with a meal. 08/04/18   Raylene Everts, MD  sevelamer carbonate (RENVELA) 2.4 G PACK Take 2.4 g by mouth 3 (three) times daily with meals.     [provider]  traMADol (ULTRAM) 50 MG tablet Take 1 tablet (50 mg total) by mouth every 12 (twelve) hours as needed. 12/02/18   Meredith Pel, MD  traMADol (ULTRAM) 50 MG tablet Take 1 tablet (50 mg total) by mouth daily as needed. 01/25/19   Suzan Slick, NP  Family History Family History  Problem Relation Age of Onset  . Hypertension Mother   . Diabetes Father   . Kidney Stones Sister   . Diabetes Maternal Grandmother   . Liver cancer Maternal Uncle     Social History Social History   Tobacco Use  . Smoking status: Current Every Day Smoker    Packs/day: 0.10    Years: 16.00    Pack years: 1.60    Types: Cigarettes    Last attempt to quit: 12/17/2014    Years since quitting: 4.1  . Smokeless tobacco: Never Used  Substance Use Topics  . Alcohol use: No    Alcohol/week: 0.0 standard drinks  . Drug use: No     Allergies   Dilaudid [hydromorphone hcl]   Review of Systems Review of Systems   Physical Exam Triage Vital Signs ED Triage Vitals [01/29/19 1803]  Enc Vitals Group     BP (!) 105/57     Pulse Rate 99     Resp 16     Temp 98.7 F (37.1 C)     Temp Source Oral     SpO2 99 %     Weight      Height      Head Circumference      Peak Flow      Pain Score 4     Pain Loc       Pain Edu?      Excl. in [place]?    No data found.  Updated Vital Signs BP (!) 105/57 (BP Location: Right Arm)   Pulse 99   Temp 98.7 F (37.1 C) (Oral)   Resp 16   SpO2 99%    Physical Exam Constitutional:      Appearance: He is well-developed.  Cardiovascular:     Rate and Rhythm: Normal rate.  Pulmonary:     Effort: Pulmonary effort is normal.  Abdominal:     Tenderness: There is abdominal tenderness in the epigastric area. There is no left CVA tenderness, guarding or rebound.  Skin:    General: Skin is warm and dry.  Neurological:     Mental Status: He is alert and oriented to person, place, and time.      UC Treatments / Results  Labs (all labs ordered are listed, but only abnormal results are displayed) Labs Reviewed - No data to display  EKG None  Radiology No results found.  Procedures Procedures (including critical care time)  Medications Ordered in UC Medications  alum & mag hydroxide-simeth (MAALOX/MYLANTA) 200-200-20 MG/5ML suspension 30 mL (30 mLs Oral Given 01/29/19 1831)    And  lidocaine (XYLOCAINE) 2 % viscous mouth solution 15 mL (15 mLs Oral Given 01/29/19 1830)  alum & mag hydroxide-simeth (MAALOX/MYLANTA) 200-200-20 MG/5ML suspension (has no administration in time range)  lidocaine (XYLOCAINE) 2 % viscous mouth solution (has no administration in time range)  lidocaine (XYLOCAINE) 2 % viscous mouth solution (has no administration in time range)    Initial Impression / Assessment and Plan / UC Course  I have reviewed the triage vital signs and the nursing notes.  Pertinent labs & imaging results that were available during my care of the patient were reviewed by me and considered in my medical decision making (see chart for details).     Vitals stable. Taking po. Pain which waxes and wanes. Question gerd? Restart protonix and provided reglan for Prn use, which he has used in the past. Return precautions provided. Patient verbalized  understanding and agreeable to plan.   Final Clinical Impressions(s) / UC Diagnoses   Final diagnoses:  Abdominal pain, epigastric  Non-intractable vomiting with nausea, unspecified vomiting type     Discharge Instructions     May restart reglan at night as needed for nausea.  Daily protonix to help with reflux symptoms.  If develop worsening of symptoms- weakness, dizziness, increased pain, fevers or uncontrolled vomiting or diarrhea please go to the ER.    ED Prescriptions    Medication Sig Dispense Auth. Provider   metoCLOPramide (REGLAN) 10 MG tablet Take 0.5 tablets (5 mg total) by mouth at bedtime as needed for nausea or vomiting. 30 tablet Augusto Gamble B, NP   pantoprazole (PROTONIX) 40 MG tablet Take 1 tablet (40 mg total) by mouth daily before breakfast. 30 tablet Augusto Gamble B, NP     Controlled Substance Prescriptions [place] Controlled Substance Registry consulted? Not Applicable   Zigmund Gottron, NP 01/29/19 2303

## 2019-01-29 NOTE — Discharge Instructions (Addendum)
May restart reglan at night as needed for nausea.  Daily protonix to help with reflux symptoms.  If develop worsening of symptoms- weakness, dizziness, increased pain, fevers or uncontrolled vomiting or diarrhea please go to the ER.

## 2019-02-15 ENCOUNTER — Other Ambulatory Visit: Payer: Self-pay | Admitting: Surgical

## 2019-02-15 ENCOUNTER — Telehealth: Payer: Self-pay | Admitting: Orthopedic Surgery

## 2019-02-15 MED ORDER — TRAMADOL HCL 50 MG PO TABS: 50.0000 mg | ORAL_TABLET | Freq: Two times a day (BID) | ORAL | 0 refills | Status: DC | PRN

## 2019-02-15 NOTE — Telephone Encounter (Signed)
Rx refill Tramadol

## 2019-02-15 NOTE — Telephone Encounter (Signed)
Ok to rf? If so please send to [facility] to submit electronically.  Thanks.

## 2019-02-15 NOTE — Telephone Encounter (Signed)
Ok to rf pls calal thx

## 2019-02-27 ENCOUNTER — Other Ambulatory Visit: Payer: Self-pay | Admitting: Surgical

## 2019-02-27 ENCOUNTER — Telehealth: Payer: Self-pay | Admitting: Orthopedic Surgery

## 2019-02-27 MED ORDER — TRAMADOL HCL 50 MG PO TABS: 50.0000 mg | ORAL_TABLET | Freq: Two times a day (BID) | ORAL | 0 refills | Status: DC | PRN

## 2019-02-27 NOTE — Telephone Encounter (Signed)
submitted

## 2019-02-27 NOTE — Telephone Encounter (Signed)
y

## 2019-02-27 NOTE — Telephone Encounter (Signed)
Patient called needing Rx refilled Tramadol. The number to contact patient is [phone] ?

## 2019-02-27 NOTE — Telephone Encounter (Signed)
Can you please submit electronically.

## 2019-02-27 NOTE — Telephone Encounter (Signed)
Please advise. Thanks.  

## 2019-03-09 ENCOUNTER — Telehealth: Payer: Self-pay | Admitting: Orthopedic Surgery

## 2019-03-09 ENCOUNTER — Other Ambulatory Visit: Payer: Self-pay

## 2019-03-09 MED ORDER — TRAMADOL HCL 50 MG PO TABS: 50.0000 mg | ORAL_TABLET | Freq: Two times a day (BID) | ORAL | 0 refills | Status: DC | PRN

## 2019-03-09 NOTE — Telephone Encounter (Signed)
Patient called requesting an RX refill on the Tramadol.  CB#[phone].  Thank you.

## 2019-03-09 NOTE — Telephone Encounter (Signed)
Called into pharmacy

## 2019-03-09 NOTE — Telephone Encounter (Signed)
Please advise 

## 2019-03-09 NOTE — Telephone Encounter (Signed)
Ok to rf am

## 2019-03-14 ENCOUNTER — Encounter (HOSPITAL_COMMUNITY): Payer: Self-pay

## 2019-03-14 ENCOUNTER — Emergency Department (HOSPITAL_COMMUNITY)
Admission: EM | Admit: 2019-03-14 | Discharge: 2019-03-14 | Disposition: A | Discharge status: Home / Self Care | Payer: Medicare Other | Attending: Emergency Medicine | Admitting: Emergency Medicine

## 2019-03-14 ENCOUNTER — Emergency Department (HOSPITAL_COMMUNITY): Payer: Medicare Other

## 2019-03-14 ENCOUNTER — Other Ambulatory Visit: Payer: Self-pay

## 2019-03-14 DIAGNOSIS — I120 Hypertensive chronic kidney disease with stage 5 chronic kidney disease or end stage renal disease: Secondary | ICD-10-CM | POA: Insufficient documentation

## 2019-03-14 DIAGNOSIS — Z992 Dependence on renal dialysis: Secondary | ICD-10-CM | POA: Insufficient documentation

## 2019-03-14 DIAGNOSIS — F17210 Nicotine dependence, cigarettes, uncomplicated: Secondary | ICD-10-CM | POA: Diagnosis not present

## 2019-03-14 DIAGNOSIS — N186 End stage renal disease: Secondary | ICD-10-CM | POA: Diagnosis not present

## 2019-03-14 DIAGNOSIS — R079 Chest pain, unspecified: Secondary | ICD-10-CM | POA: Diagnosis present

## 2019-03-14 LAB — CBC
HCT: 36.3 % — ABNORMAL LOW (ref 39.0–52.0)
Hemoglobin: 12.4 g/dL — ABNORMAL LOW (ref 13.0–17.0)
MCH: 31.6 pg (ref 26.0–34.0)
MCHC: 34.2 g/dL (ref 30.0–36.0)
MCV: 92.6 fL (ref 80.0–100.0)
Platelets: 242 10*3/uL (ref 150–400)
RBC: 3.92 MIL/uL — ABNORMAL LOW (ref 4.22–5.81)
RDW: 13.5 % (ref 11.5–15.5)
WBC: 11.9 10*3/uL — ABNORMAL HIGH (ref 4.0–10.5)
nRBC: 0 % (ref 0.0–0.2)

## 2019-03-14 LAB — BASIC METABOLIC PANEL
Anion gap: 18 — ABNORMAL HIGH (ref 5–15)
BUN: 38 mg/dL — ABNORMAL HIGH (ref 6–20)
CO2: 23 mmol/L (ref 22–32)
Calcium: 10.4 mg/dL — ABNORMAL HIGH (ref 8.9–10.3)
Chloride: 97 mmol/L — ABNORMAL LOW (ref 98–111)
Creatinine, Ser: 8.73 mg/dL — ABNORMAL HIGH (ref 0.61–1.24)
GFR calc Af Amer: 8 mL/min — ABNORMAL LOW (ref >60)
GFR calc non Af Amer: 7 mL/min — ABNORMAL LOW (ref >60)
Glucose, Bld: 111 mg/dL — ABNORMAL HIGH (ref 70–99)
Potassium: 3.2 mmol/L — ABNORMAL LOW (ref 3.5–5.1)
Sodium: 138 mmol/L (ref 135–145)

## 2019-03-14 LAB — TROPONIN I (HIGH SENSITIVITY)
Troponin I (High Sensitivity): 6 ng/L (ref <18)
Troponin I (High Sensitivity): 6 ng/L (ref <18)

## 2019-03-14 MED ORDER — SODIUM CHLORIDE 0.9% FLUSH: 3.0000 mL | Freq: Once | INTRAVENOUS | Status: DC

## 2019-03-14 NOTE — ED Triage Notes (Signed)
Pt reports that he has has CP on and off since Friday with some SOB today, pain comes and goes, denies nausea, dialysis pt

## 2019-03-14 NOTE — ED Provider Notes (Signed)
[facility] Emergency Department Provider Note MRN:  [id number]  Arrival date & time: 03/14/19     Chief Complaint   Chest Pain   History of Present Illness   [name] is a 33 y.o. year-old male with a history of ESRD presenting to the ED with chief complaint of chest pain.  2 or 3 days of constant left-sided sharp chest pain, worse with palpation, worse with moving or twisting of the body.  Denies trauma.  Denies shortness of breath, no fever, no cough, no dizziness, no diaphoresis.  No other exacerbating or relieving factors.  Pain is mild to moderate in severity.  Review of Systems  A complete 10 system review of systems was obtained and all systems are negative except as noted in the HPI and PMH.   Patient's Health History    Past Medical History:  Diagnosis Date  . Arthritis    "all over" (11/19/2016)  . Chronic lower back pain   . Complication of anesthesia    A little while to wake up after knee surgery in 2008  . Dizziness    when coming off of dialysis  . ESRD (end stage renal disease) on dialysis [facility])    MWF and goes to [facility] (11/19/2016)  . Family history of anesthesia complication    mom slow to wake up  . GERD (gastroesophageal reflux disease)    takes Omeprazole as needed  . [id number])    "monthly" (11/19/2016)  . History of hiatal hernia   . Hyperparathyroidism ([place])   . Hypertension   . Joint pain   . Joint swelling   . Morbid obesity ([place])   . PONV (postoperative nausea and vomiting)   . Renal insufficiency    Pt is on dyalisis and has been for 14 years    Past Surgical History:  Procedure Laterality Date  . AV FISTULA PLACEMENT Bilateral 2005,2007   "right; left"  . AV FISTULA REPAIR Right 2007   "tried to clean it out; couldn't do it"  . BREATH TEK H PYLORI N/A 05/15/2014   Procedure: BREATH TEK H PYLORI;  Surgeon: Alphonsa Overall, MD;  Location: [facility];  Service: General;  Laterality: N/A;  .  CHOLECYSTECTOMY N/A 11/23/2016   Procedure: LAPAROSCOPIC CHOLECYSTECTOMY WITH INTRAOPERATIVE CHOLANGIOGRAM;  Surgeon: Erroll Luna, MD;  Location: [place];  Service: General;  Laterality: N/A;  . ESOPHAGOGASTRODUODENOSCOPY N/A 12/07/2014   Procedure: ESOPHAGOGASTRODUODENOSCOPY (EGD);  Surgeon: Alphonsa Overall, MD;  Location: [facility] ENDOSCOPY;  Service: General;  Laterality: N/A;  . ESOPHAGOGASTRODUODENOSCOPY N/A 12/17/2014   Procedure: ESOPHAGOGASTRODUODENOSCOPY (EGD);  Surgeon: Alphonsa Overall, MD;  Location: [place];  Service: General;  Laterality: N/A;  . ESOPHAGOGASTRODUODENOSCOPY N/A 08/16/2016   Procedure: ESOPHAGOGASTRODUODENOSCOPY (EGD);  Surgeon: Jerene Bears, MD;  Location: [place];  Service: Gastroenterology;  Laterality: N/A;  . ESOPHAGOGASTRODUODENOSCOPY (EGD) WITH PROPOFOL N/A 07/05/2017   Procedure: ESOPHAGOGASTRODUODENOSCOPY (EGD) WITH PROPOFOL;  Surgeon: Milus Banister, MD;  Location: [facility] ENDOSCOPY;  Service: Endoscopy;  Laterality: N/A;  . HERNIA REPAIR  11/2014   w/lap band OR  . JOINT REPLACEMENT    . KNEE ARTHROSCOPY Left 2007  . LAPAROSCOPIC GASTRIC BANDING N/A 11/12/2014   Procedure: LAPAROSCOPIC GASTRIC BANDING;  Surgeon: Alphonsa Overall, MD;  [facility];  Service: General;  Laterality: N/A;  . PARATHYROIDECTOMY N/A 03/30/2013   Procedure: TOTAL PARATHYROIDECTOMY WITH AUTOTRANSPLANT;  Surgeon: Earnstine Regal, MD;  Location: [place];  Service: General;  Laterality: N/A;  Autotransplant to left lower  arm.  Marland Kitchen TOTAL KNEE ARTHROPLASTY Right 03/27/2014   Procedure: UNICOMPARTMENTAL ARTHROPLASTY;  Surgeon: Meredith Pel, MD;  Location: [place];  Service: Orthopedics;  Laterality: Right;    Family History  Problem Relation Age of Onset  . Hypertension Mother   . Diabetes Father   . Kidney Stones Sister   . Diabetes Maternal Grandmother   . Liver cancer Maternal Uncle     Social History   Socioeconomic History  . Marital status: Single    Spouse name: Not on file  . Number of  children: 0  . Years of education: Not on file  . Highest education level: Not on file  Occupational History  . Not on file  Social Needs  . Financial resource strain: Not on file  . Food insecurity    Worry: Not on file    Inability: Not on file  . Transportation needs    Medical: Not on file    Non-medical: Not on file  Tobacco Use  . Smoking status: Current Every Day Smoker    Packs/day: 0.10    Years: 16.00    Pack years: 1.60    Types: Cigarettes    Last attempt to quit: 12/17/2014    Years since quitting: 4.2  . Smokeless tobacco: Never Used  Substance and Sexual Activity  . Alcohol use: No    Alcohol/week: 0.0 standard drinks  . Drug use: No  . Sexual activity: Yes  Lifestyle  . Physical activity    Days per week: Not on file    Minutes per session: Not on file  . Stress: Not on file  Relationships  . Social Herbalist on phone: Not on file    Gets together: Not on file    Attends religious service: Not on file    Active member of club or organization: Not on file    Attends meetings of clubs or organizations: Not on file    Relationship status: Not on file  . Intimate partner violence    Fear of current or ex partner: Not on file    Emotionally abused: Not on file    Physically abused: Not on file    Forced sexual activity: Not on file  Other Topics Concern  . Not on file  Social History Narrative  . Not on file     Physical Exam  Vital Signs and Nursing Notes reviewed Vitals:   03/14/19 0346 03/14/19 0545  BP: (!) 91/50 112/61  Pulse: 84 77  Resp: 20 16  Temp: 98.6 F (37 C)   SpO2: 96% 98%    CONSTITUTIONAL: Well-appearing, NAD NEURO:  Alert and oriented x 3, no focal deficits EYES:  eyes equal and reactive ENT/NECK:  no LAD, no JVD CARDIO: Regular rate, well-perfused, normal S1 and S2; focal tenderness to palpation to the left chest PULM:  CTAB no wheezing or rhonchi GI/GU:  normal bowel sounds, non-distended, non-tender  MSK/SPINE:  No gross deformities, no edema SKIN:  no rash, atraumatic PSYCH:  Appropriate speech and behavior  Diagnostic and Interventional Summary    EKG Interpretation  Date/Time:  Tuesday March 14 2019 01:42:41 EDT Ventricular Rate:  97 PR Interval:  174 QRS Duration: 114 QT Interval:  328 QTC Calculation: 416 R Axis:   38 Text Interpretation:  Normal sinus rhythm Lateral infarct , age undetermined Abnormal ECG Confirmed by Gerlene Fee [phone]) on 03/14/2019 5:46:23 AM      Labs Reviewed  BASIC METABOLIC PANEL -  Abnormal; Notable for the following components:      Result Value   Potassium 3.2 (*)    Chloride 97 (*)    Glucose, Bld 111 (*)    BUN 38 (*)    Creatinine, Ser 8.73 (*)    Calcium 10.4 (*)    GFR calc non Af Amer 7 (*)    GFR calc Af Amer 8 (*)    Anion gap 18 (*)    All other components within normal limits  CBC - Abnormal; Notable for the following components:   WBC 11.9 (*)    RBC 3.92 (*)    Hemoglobin 12.4 (*)    HCT 36.3 (*)    All other components within normal limits  TROPONIN I (HIGH SENSITIVITY)  TROPONIN I (HIGH SENSITIVITY)    DG Chest 2 View  Final Result      Medications  sodium chloride flush (NS) 0.9 % injection 3 mL (has no administration in time range)     Procedures Critical Care  ED Course and Medical Decision Making  I have reviewed the triage vital signs and the nursing notes.  Pertinent labs & imaging results that were available during my care of the patient were reviewed by me and considered in my medical decision making (see below for details).  Consistent with MSK chest pain, labs are reassuring, no tachycardia, no evidence of DVT, PERC negative.  After the discussed management above, the patient was determined to be safe for discharge.  The patient was in agreement with this plan and all questions regarding their care were answered.  ED return precautions were discussed and the patient will return to the ED with any  significant worsening of condition.  Barth Kirks. [place]onia Small, [place] [email or web address]  Final Clinical Impressions(s) / ED Diagnoses     ICD-10-CM   1. Chest pain, unspecified type  R07.9     ED Discharge Orders    None         Maudie Flakes, MD 03/14/19 [phone]

## 2019-03-14 NOTE — Discharge Instructions (Addendum)
You were evaluated in the Emergency Department and after careful evaluation, we did not find any emergent condition requiring admission or further testing in the hospital.  Your symptoms today seem to be due to costochondritis, or inflammation and pain related to the chest wall.  Your testing today was otherwise very reassuring.  As discussed, you can take Tylenol and your muscle relaxer at home for discomfort.  Please return to the Emergency Department if you experience any worsening of your condition.  We encourage you to follow up with a primary care provider.  Thank you for allowing [place] to be a part of your care.

## 2019-04-04 ENCOUNTER — Ambulatory Visit (INDEPENDENT_AMBULATORY_CARE_PROVIDER_SITE_OTHER): Payer: Medicare Other | Admitting: Physician Assistant

## 2019-04-04 ENCOUNTER — Encounter: Payer: Self-pay | Admitting: Physician Assistant

## 2019-04-04 VITALS — BP 80/50 | HR 56 | Temp 97.8°F | Ht 66.0 in | Wt 314.8 lb

## 2019-04-04 DIAGNOSIS — R1310 Dysphagia, unspecified: Secondary | ICD-10-CM | POA: Diagnosis not present

## 2019-04-04 DIAGNOSIS — R112 Nausea with vomiting, unspecified: Secondary | ICD-10-CM | POA: Diagnosis not present

## 2019-04-04 MED ORDER — PANTOPRAZOLE SODIUM 40 MG PO TBEC: 40.0000 mg | DELAYED_RELEASE_TABLET | Freq: Every day | ORAL | 0 refills | Status: DC

## 2019-04-04 MED ORDER — METOCLOPRAMIDE HCL 10 MG PO TABS: 5.0000 mg | ORAL_TABLET | Freq: Every evening | ORAL | 0 refills | Status: DC | PRN

## 2019-04-04 NOTE — Progress Notes (Signed)
____________________________________________________________  Attending physician addendum:  Thank you for sending this case to me. I have reviewed the entire note, and I recall his case.  Still suspect this may be a motility problem.   Please arrange a barium esophagram on him.  That will help determine utility and timing of EGD, especially given the medical complexity and need for it to be done at the hospital, for which I am currently booking into November given the COVID-related backlog.  Wilfrid Lund, MD  ____________________________________________________________

## 2019-04-04 NOTE — Progress Notes (Signed)
Chief Complaint: Dysphagia  HPI:    [name] is a 33 year old African-American male known to Dr. Loletha Carrow for suspected gastroparesis, who presents to clinic today with a complaint of dysphagia.      01/06/2017 patient seen in clinic by Dr. Loletha Carrow, at that time recent CT abdomen pelvis was reviewed with no structural inflammatory obstructive cause for his nausea and vomiting.  It was still thought this is gastroparesis or gastric dysmotility related to his chronic kidney disease and effective hemodialysis.  He was on maximal medical therapy at that time and was told to use suppositories to abort severe episodes.  Had a previous cholecystectomy which did not relieve his symptoms and unrevealing upper endoscopy that year.    07/05/2017 EGD Dr. Ardis Hughs during hospitalization.  At that time findings of mild inflammation and erythema in the gastric antrum as well as nonspecific distal gastritis and very mild bulbar duodenitis.  It was discussed that he had been extensively tested over the past 1 to 2 years with multiple CTs, ultrasound, EGD, gastric emptying and elective lap chole.  It was discussed to treat him symptomatically and stop narcotic pain meds which were worsening condition.    Today, the patient presents clinic and explains that he has been battling with off-and-on nausea and vomiting ever since being seen last but recently over the past month or so he has been experiencing food getting stuck in his esophagus on its way down.  He tells me when this occurs it is quite painful in his chest area.  He can still eat and drink but does have to be very careful with chewing food into small pieces and drinking water to help things go down.  Does tell me that he was off of his Pantoprazole and Reglan for a period of time and since being started back on them at the ER it has helped some with "the acute inflammation", but he has continued with symptoms.  He is currently on Pantoprazole 40 mg daily and Reglan 5 mg at  bedtime.    Denies fever, chills, weight loss or symptoms that awaken him from sleep.  Past Medical History:  Diagnosis Date   Arthritis    "all over" (11/19/2016)   Chronic lower back pain    Complication of anesthesia    A little while to wake up after knee surgery in 2008   Dizziness    when coming off of dialysis   ESRD (end stage renal disease) on dialysis [facility])    MWF and goes to [facility] (11/19/2016)   Family history of anesthesia complication    mom slow to wake up   GERD (gastroesophageal reflux disease)    takes Omeprazole as needed   Headache(784.0)    "monthly" (11/19/2016)   History of hiatal hernia    Hyperparathyroidism ([place])    Hypertension    Joint pain    Joint swelling    Morbid obesity ([place])    PONV (postoperative nausea and vomiting)    Renal insufficiency    Pt is on dyalisis and has been for 14 years    Past Surgical History:  Procedure Laterality Date   AV FISTULA PLACEMENT Bilateral 2005,2007   "right; left"   AV FISTULA REPAIR Right 2007   "tried to clean it out; couldn't do it"   [place] N/A 05/15/2014   Procedure: BREATH TEK H PYLORI;  Surgeon: Alphonsa Overall, MD;  Location: [facility];  Service: General;  Laterality: N/A;  CHOLECYSTECTOMY N/A 11/23/2016   Procedure: LAPAROSCOPIC CHOLECYSTECTOMY WITH INTRAOPERATIVE CHOLANGIOGRAM;  Surgeon: Erroll Luna, MD;  Location: [place];  Service: General;  Laterality: N/A;   ESOPHAGOGASTRODUODENOSCOPY N/A 12/07/2014   Procedure: ESOPHAGOGASTRODUODENOSCOPY (EGD);  Surgeon: Alphonsa Overall, MD;  Location: [facility] ENDOSCOPY;  Service: General;  Laterality: N/A;   ESOPHAGOGASTRODUODENOSCOPY N/A 12/17/2014   Procedure: ESOPHAGOGASTRODUODENOSCOPY (EGD);  Surgeon: Alphonsa Overall, MD;  Location: [place];  Service: General;  Laterality: N/A;   ESOPHAGOGASTRODUODENOSCOPY N/A 08/16/2016   Procedure: ESOPHAGOGASTRODUODENOSCOPY (EGD);  Surgeon: Jerene Bears, MD;  Location: [place];  Service:  Gastroenterology;  Laterality: N/A;   ESOPHAGOGASTRODUODENOSCOPY (EGD) WITH PROPOFOL N/A 07/05/2017   Procedure: ESOPHAGOGASTRODUODENOSCOPY (EGD) WITH PROPOFOL;  Surgeon: Milus Banister, MD;  Location: [facility] ENDOSCOPY;  Service: Endoscopy;  Laterality: N/A;   HERNIA REPAIR  11/2014   w/lap band OR   JOINT REPLACEMENT     KNEE ARTHROSCOPY Left 2007   LAPAROSCOPIC GASTRIC BANDING N/A 11/12/2014   Procedure: LAPAROSCOPIC GASTRIC BANDING;  Surgeon: Alphonsa Overall, MD;  Location: [facility];  Service: General;  Laterality: N/A;   PARATHYROIDECTOMY N/A 03/30/2013   Procedure: TOTAL PARATHYROIDECTOMY WITH AUTOTRANSPLANT;  Surgeon: Earnstine Regal, MD;  Location: [place];  Service: General;  Laterality: N/A;  Autotransplant to left lower arm.   TOTAL KNEE ARTHROPLASTY Right 03/27/2014   Procedure: UNICOMPARTMENTAL ARTHROPLASTY;  Surgeon: Meredith Pel, MD;  Location: [place];  Service: Orthopedics;  Laterality: Right;    Current Outpatient Medications  Medication Sig Dispense Refill   albuterol (PROVENTIL HFA;VENTOLIN HFA) 108 (90 Base) MCG/ACT inhaler Inhale 2 puffs into the lungs every 4 (four) hours as needed for wheezing or shortness of breath. 1 Inhaler 0   calcium carbonate (TUMS - DOSED IN MG ELEMENTAL CALCIUM) 500 MG chewable tablet Chew 2 tablets by mouth 2 (two) times daily as needed for indigestion or heartburn.      famotidine (PEPCID) 10 MG tablet Take 1 tablet (10 mg total) by mouth at bedtime. 30 tablet 0   metoCLOPramide (REGLAN) 10 MG tablet Take 0.5 tablets (5 mg total) by mouth at bedtime as needed for nausea or vomiting. 30 tablet 0   multivitamin (RENA-VIT) TABS tablet Take 1 tablet by mouth daily.     ondansetron (ZOFRAN ODT) 4 MG disintegrating tablet Take 1 tablet (4 mg total) by mouth every 8 (eight) hours as needed for nausea or vomiting. 15 tablet 0   pantoprazole (PROTONIX) 40 MG tablet Take 1 tablet (40 mg total) by mouth daily before breakfast. 30 tablet 0    predniSONE (DELTASONE) 20 MG tablet Take 1 tablet (20 mg total) by mouth 2 (two) times daily with a meal. 10 tablet 0   sevelamer carbonate (RENVELA) 2.4 G PACK Take 2.4 g by mouth 3 (three) times daily with meals.      traMADol (ULTRAM) 50 MG tablet Take 1 tablet (50 mg total) by mouth every 12 (twelve) hours as needed. 30 tablet 0   No current facility-administered medications for this visit.     Allergies as of 04/04/2019 - Review Complete 03/14/2019  Allergen Reaction Noted   Dilaudid [hydromorphone hcl] Other (See Comments) 08/13/2016    Family History  Problem Relation Age of Onset   Hypertension Mother    Diabetes Father    Kidney Stones Sister    Diabetes Maternal Grandmother    Liver cancer Maternal Uncle     Social History   Socioeconomic History   Marital status: Single    Spouse name: Not  on file   Number of children: 0   Years of education: Not on file   Highest education level: Not on file  Occupational History   Not on file  Social Needs   Financial resource strain: Not on file   Food insecurity    Worry: Not on file    Inability: Not on file   Transportation needs    Medical: Not on file    Non-medical: Not on file  Tobacco Use   Smoking status: Current Every Day Smoker    Packs/day: 0.10    Years: 16.00    Pack years: 1.60    Types: Cigarettes    Last attempt to quit: 12/17/2014    Years since quitting: 4.2   Smokeless tobacco: Never Used  Substance and Sexual Activity   Alcohol use: No    Alcohol/week: 0.0 standard drinks   Drug use: No   Sexual activity: Yes  Lifestyle   Physical activity    Days per week: Not on file    Minutes per session: Not on file   Stress: Not on file  Relationships   Social connections    Talks on phone: Not on file    Gets together: Not on file    Attends religious service: Not on file    Active member of club or organization: Not on file    Attends meetings of clubs or organizations:  Not on file    Relationship status: Not on file   Intimate partner violence    Fear of current or ex partner: Not on file    Emotionally abused: Not on file    Physically abused: Not on file    Forced sexual activity: Not on file  Other Topics Concern   Not on file  Social History Narrative   Not on file    Review of Systems:    Constitutional: No weight loss, fever or chills Cardiovascular: No chest pain Respiratory: No SOB  Gastrointestinal: See HPI and otherwise negative Hematologic: No bleeding  Psychiatric: No history of depression or anxiety   Physical Exam:  Vital signs: BP (!) 80/50    Pulse (!) 56    Temp 97.8 F (36.6 C)    Ht 5\' 6"  (1.676 m)    Wt (!) 314 lb 12.8 oz (142.8 kg)    BMI 50.81 kg/m   Constitutional:   Pleasant obese AA male appears to be in NAD, Well developed, Well nourished, alert and cooperative Respiratory: Respirations even and unlabored. Lungs clear to auscultation bilaterally.   No wheezes, crackles, or rhonchi.  Cardiovascular: Normal S1, S2. No MRG. Regular rate and rhythm. No peripheral edema, cyanosis or pallor.  Gastrointestinal:  Soft, nondistended, nontender. No rebound or guarding. Normal bowel sounds. No appreciable masses or hepatomegaly. Rectal:  Not performed.  Psychiatric:  Demonstrates good judgement and reason without abnormal affect or behaviors.  MOST RECENT LABS AND IMAGING: CBC    Component Value Date/Time   WBC 11.9 (H) 03/14/2019 0142   RBC 3.92 (L) 03/14/2019 0142   HGB 12.4 (L) 03/14/2019 0142   HCT 36.3 (L) 03/14/2019 0142   PLT 242 03/14/2019 0142   MCV 92.6 03/14/2019 0142   MCH 31.6 03/14/2019 0142   MCHC 34.2 03/14/2019 0142   RDW 13.5 03/14/2019 0142   LYMPHSABS 4.1 (H) 07/05/2017 0628   MONOABS 1.3 (H) 07/05/2017 0628   EOSABS 0.1 07/05/2017 0628   BASOSABS 0.1 07/05/2017 0628    CMP     Component  Value Date/Time   NA 138 03/14/2019 0142   K 3.2 (L) 03/14/2019 0142   CL 97 (L) 03/14/2019 0142    CO2 23 03/14/2019 0142   GLUCOSE 111 (H) 03/14/2019 0142   BUN 38 (H) 03/14/2019 0142   CREATININE 8.73 (H) 03/14/2019 0142   CALCIUM 10.4 (H) 03/14/2019 0142   PROT 8.6 (H) 08/05/2017 1257   ALBUMIN 4.4 08/05/2017 1257   AST 17 08/05/2017 1257   ALT 19 08/05/2017 1257   ALKPHOS 61 08/05/2017 1257   BILITOT 1.3 (H) 08/05/2017 1257   GFRNONAA 7 (L) 03/14/2019 0142   GFRAA 8 (L) 03/14/2019 0142    Assessment: 1.  Dysphagia: Worse over the past month, to solids; consider most likely stricture versus esophagitis 2.  Nausea and vomiting: As previously diagnosed likely related to gastroparesis  Plan: 1.  Refilled Pantoprazole 40 mg daily, 30-60 minutes before breakfast #30 with 3 refills 2.  Refilled Reglan 5 mg nightly 3.  We will schedule patient for an EGD with dilation in the hospital due to a BMI greater than 50.  Patient is on dialysis Monday Wednesday Friday.  This will be scheduled per Dr. Loletha Carrow at his discretion. 4.  Patient will be contacted with appointment above. 5.  Reviewed anti-dysphagia measures including taking small bites, drinking sips of water in between bites, the chin tuck technique and avoiding distraction while eating. 6.  Patient to follow in clinic per recommendations from Dr. Loletha Carrow after time of procedure.  If he has any acute change in symptoms he was advised to go to the ER.  Ellouise Newer, [name] [facility] 04/04/2019, 2:02 PM  Cc: Nolene Ebbs, MD

## 2019-04-04 NOTE — Patient Instructions (Signed)
We have sent the following medications to your pharmacy for you to pick up at your convenience:  Pantoprazole 40 mg   Reglan 10 mg  We will call you to schedule an EGD with Dr Loletha Carrow.

## 2019-04-05 ENCOUNTER — Telehealth: Payer: Self-pay

## 2019-04-05 ENCOUNTER — Other Ambulatory Visit: Payer: Self-pay

## 2019-04-05 DIAGNOSIS — R1310 Dysphagia, unspecified: Secondary | ICD-10-CM

## 2019-04-05 NOTE — Telephone Encounter (Signed)
Patient scheduled for Barium swallow study at [facility] 04/13/19 patient to arrive at 9:15am and be NPO 3 hours before. Patient notified of the above and agrees

## 2019-04-13 ENCOUNTER — Emergency Department (HOSPITAL_COMMUNITY)
Admission: EM | Admit: 2019-04-13 | Discharge: 2019-04-13 | Disposition: A | Discharge status: Home / Self Care | Payer: Medicare Other | Attending: Emergency Medicine | Admitting: Emergency Medicine

## 2019-04-13 ENCOUNTER — Other Ambulatory Visit: Payer: Self-pay

## 2019-04-13 ENCOUNTER — Encounter (HOSPITAL_COMMUNITY): Payer: Self-pay

## 2019-04-13 ENCOUNTER — Ambulatory Visit (HOSPITAL_COMMUNITY): Payer: Medicare Other

## 2019-04-13 DIAGNOSIS — N186 End stage renal disease: Secondary | ICD-10-CM | POA: Diagnosis not present

## 2019-04-13 DIAGNOSIS — I120 Hypertensive chronic kidney disease with stage 5 chronic kidney disease or end stage renal disease: Secondary | ICD-10-CM | POA: Diagnosis not present

## 2019-04-13 DIAGNOSIS — Z992 Dependence on renal dialysis: Secondary | ICD-10-CM | POA: Diagnosis not present

## 2019-04-13 DIAGNOSIS — R112 Nausea with vomiting, unspecified: Secondary | ICD-10-CM

## 2019-04-13 DIAGNOSIS — Z79899 Other long term (current) drug therapy: Secondary | ICD-10-CM | POA: Diagnosis not present

## 2019-04-13 DIAGNOSIS — Z87891 Personal history of nicotine dependence: Secondary | ICD-10-CM | POA: Diagnosis not present

## 2019-04-13 LAB — CBC
HCT: 39.6 % (ref 39.0–52.0)
Hemoglobin: 13.3 g/dL (ref 13.0–17.0)
MCH: 31.1 pg (ref 26.0–34.0)
MCHC: 33.6 g/dL (ref 30.0–36.0)
MCV: 92.5 fL (ref 80.0–100.0)
Platelets: 223 10*3/uL (ref 150–400)
RBC: 4.28 MIL/uL (ref 4.22–5.81)
RDW: 13.5 % (ref 11.5–15.5)
WBC: 9.9 10*3/uL (ref 4.0–10.5)
nRBC: 0 % (ref 0.0–0.2)

## 2019-04-13 LAB — COMPREHENSIVE METABOLIC PANEL
ALT: 22 U/L (ref 0–44)
AST: 14 U/L — ABNORMAL LOW (ref 15–41)
Albumin: 4.6 g/dL (ref 3.5–5.0)
Alkaline Phosphatase: 46 U/L (ref 38–126)
Anion gap: 24 — ABNORMAL HIGH (ref 5–15)
BUN: 31 mg/dL — ABNORMAL HIGH (ref 6–20)
CO2: 23 mmol/L (ref 22–32)
Calcium: 11.3 mg/dL — ABNORMAL HIGH (ref 8.9–10.3)
Chloride: 90 mmol/L — ABNORMAL LOW (ref 98–111)
Creatinine, Ser: 9.79 mg/dL — ABNORMAL HIGH (ref 0.61–1.24)
GFR calc Af Amer: 7 mL/min — ABNORMAL LOW (ref >60)
GFR calc non Af Amer: 6 mL/min — ABNORMAL LOW (ref >60)
Glucose, Bld: 113 mg/dL — ABNORMAL HIGH (ref 70–99)
Potassium: 3.1 mmol/L — ABNORMAL LOW (ref 3.5–5.1)
Sodium: 137 mmol/L (ref 135–145)
Total Bilirubin: 1.1 mg/dL (ref 0.3–1.2)
Total Protein: 9 g/dL — ABNORMAL HIGH (ref 6.5–8.1)

## 2019-04-13 LAB — LIPASE, BLOOD: Lipase: 29 U/L (ref 11–51)

## 2019-04-13 MED ORDER — SODIUM CHLORIDE 0.9% FLUSH
3.0000 mL | Freq: Once | INTRAVENOUS | Status: AC
  Administered 2019-04-13: 3 mL via INTRAVENOUS

## 2019-04-13 MED ORDER — METOCLOPRAMIDE HCL 5 MG/ML IJ SOLN
10.0000 mg | Freq: Once | INTRAMUSCULAR | Status: AC
  Administered 2019-04-13: 10 mg via INTRAVENOUS
  Filled 2019-04-13: qty 2

## 2019-04-13 MED ORDER — LORAZEPAM 2 MG/ML IJ SOLN
0.5000 mg | Freq: Once | INTRAMUSCULAR | Status: AC
  Administered 2019-04-13: 0.5 mg via INTRAVENOUS
  Filled 2019-04-13: qty 1

## 2019-04-13 MED ORDER — FAMOTIDINE IN NACL 20-0.9 MG/50ML-% IV SOLN
20.0000 mg | Freq: Once | INTRAVENOUS | Status: AC
  Administered 2019-04-13: 20 mg via INTRAVENOUS
  Filled 2019-04-13: qty 50

## 2019-04-13 NOTE — ED Notes (Signed)
Pt does not produce urine. Dialysis pt

## 2019-04-13 NOTE — ED Triage Notes (Signed)
Pt c/o generalized abdominal pain and emesis since last night.  Pain score 9/10.  Hx of dialysis.  Last dialysis was yesterday.  Pt attempted to take Reglan w/o relief.

## 2019-04-13 NOTE — ED Provider Notes (Signed)
[place] DEPT Provider Note   CSN: [id number] Arrival date & time: 04/13/19  [id number]     History   Chief Complaint Chief Complaint  Patient presents with  . Abdominal Pain  . Emesis    HPI [name] is a 33 y.o. male.     33 year old male with prior medical history as detailed below presents for evaluation of nausea and vomiting.  Patient reports longstanding history of similar symptoms in the past.  He reports that his current episode of nausea and vomiting started yesterday after dialysis.  He reports a normal dialysis session yesterday.  He denies associated fever.  He denies associated chest pain, shortness of breath, or other symptom.   The history is provided by the patient and medical records.  Emesis Severity:  Mild Duration:  1 day Timing:  Constant Progression:  Unchanged Chronicity:  Recurrent Recent urination:  Normal Relieved by:  Nothing Worsened by:  Nothing   Past Medical History:  Diagnosis Date  . Arthritis    "all over" (11/19/2016)  . Chronic lower back pain   . Complication of anesthesia    A little while to wake up after knee surgery in 2008  . Dizziness    when coming off of dialysis  . ESRD (end stage renal disease) on dialysis [facility])    MWF and goes to [facility] (11/19/2016)  . Family history of anesthesia complication    mom slow to wake up  . GERD (gastroesophageal reflux disease)    takes Omeprazole as needed  . [id number])    "monthly" (11/19/2016)  . History of hiatal hernia   . Hyperparathyroidism ([place])   . Hypertension   . Joint pain   . Joint swelling   . Morbid obesity ([place])   . PONV (postoperative nausea and vomiting)   . Renal insufficiency    Pt is on dyalisis and has been for 14 years    Patient Active Problem List   Diagnosis Date Noted  . Abdominal pain 07/04/2017  . Gastritis and gastroduodenitis 11/19/2016  . Increased anion gap metabolic acidosis [id number]  .  Intractable abdominal pain   . Common bile duct dilatation   . Cholelithiasis without cholecystitis   . Intractable nausea and vomiting 10/08/2016  . Hematemesis 08/14/2016  . GERD (gastroesophageal reflux disease) 08/14/2016  . Essential hypertension 08/14/2016  . GIB (gastrointestinal bleeding) 08/14/2016  . SIRS (systemic inflammatory response syndrome) ([place]) 08/14/2016  . CKD (chronic kidney disease) stage V requiring chronic dialysis ([place]) 06/11/2016  . Non-intractable vomiting with nausea 06/11/2016  . Hepatic steatosis 06/11/2016  . Cholelithiasis 06/11/2016  . Localized osteoarthritis of left knee 07/11/2015  . Abdominal pain, epigastric 12/05/2014  . Lapband April 2016 11/27/2014  . Arthritis of knee 03/27/2014  . Morbid obesity, weight 291, BMI - 47 02/15/2014  . Hyperparathyroidism, secondary ([place]) 03/14/2013    Past Surgical History:  Procedure Laterality Date  . AV FISTULA PLACEMENT Bilateral 2005,2007   "right; left"  . AV FISTULA REPAIR Right 2007   "tried to clean it out; couldn't do it"  . BREATH TEK H PYLORI N/A 05/15/2014   Procedure: BREATH TEK H PYLORI;  Surgeon: Alphonsa Overall, MD;  Location: [facility];  Service: General;  Laterality: N/A;  . CHOLECYSTECTOMY N/A 11/23/2016   Procedure: LAPAROSCOPIC CHOLECYSTECTOMY WITH INTRAOPERATIVE CHOLANGIOGRAM;  Surgeon: Erroll Luna, MD;  Location: [place];  Service: General;  Laterality: N/A;  . ESOPHAGOGASTRODUODENOSCOPY N/A 12/07/2014   Procedure: ESOPHAGOGASTRODUODENOSCOPY (EGD);  Surgeon:  Alphonsa Overall, MD;  Location: [facility] ENDOSCOPY;  Service: General;  Laterality: N/A;  . ESOPHAGOGASTRODUODENOSCOPY N/A 12/17/2014   Procedure: ESOPHAGOGASTRODUODENOSCOPY (EGD);  Surgeon: Alphonsa Overall, MD;  Location: [place];  Service: General;  Laterality: N/A;  . ESOPHAGOGASTRODUODENOSCOPY N/A 08/16/2016   Procedure: ESOPHAGOGASTRODUODENOSCOPY (EGD);  Surgeon: Jerene Bears, MD;  Location: [place];  Service: Gastroenterology;   Laterality: N/A;  . ESOPHAGOGASTRODUODENOSCOPY (EGD) WITH PROPOFOL N/A 07/05/2017   Procedure: ESOPHAGOGASTRODUODENOSCOPY (EGD) WITH PROPOFOL;  Surgeon: Milus Banister, MD;  Location: [facility] ENDOSCOPY;  Service: Endoscopy;  Laterality: N/A;  . HERNIA REPAIR  11/2014   w/lap band OR  . JOINT REPLACEMENT    . KNEE ARTHROSCOPY Left 2007  . LAPAROSCOPIC GASTRIC BANDING N/A 11/12/2014   Procedure: LAPAROSCOPIC GASTRIC BANDING;  Surgeon: Alphonsa Overall, MD;  [facility];  Service: General;  Laterality: N/A;  . PARATHYROIDECTOMY N/A 03/30/2013   Procedure: TOTAL PARATHYROIDECTOMY WITH AUTOTRANSPLANT;  Surgeon: Earnstine Regal, MD;  Location: [place];  Service: General;  Laterality: N/A;  Autotransplant to left lower arm.  Marland Kitchen TOTAL KNEE ARTHROPLASTY Right 03/27/2014   Procedure: UNICOMPARTMENTAL ARTHROPLASTY;  Surgeon: Meredith Pel, MD;  Location: [place];  Service: Orthopedics;  Laterality: Right;        Home Medications    Prior to Admission medications   Medication Sig Start Date End Date Taking? Authorizing Provider  albuterol (PROVENTIL HFA;VENTOLIN HFA) 108 (90 Base) MCG/ACT inhaler Inhale 2 puffs into the lungs every 4 (four) hours as needed for wheezing or shortness of breath. 05/14/16   Lysbeth Penner, [name]  calcium carbonate (TUMS - DOSED IN MG ELEMENTAL CALCIUM) 500 MG chewable tablet Chew 2 tablets by mouth 2 (two) times daily as needed for indigestion or heartburn.     [provider]  famotidine (PEPCID) 10 MG tablet Take 1 tablet (10 mg total) by mouth at bedtime. 07/05/17   Alma Friendly, MD  metoCLOPramide (REGLAN) 10 MG tablet Take 0.5 tablets (5 mg total) by mouth at bedtime as needed for nausea or vomiting. 04/04/19   Levin Erp, PA  multivitamin (RENA-VIT) TABS tablet Take 1 tablet by mouth daily.    [provider]  ondansetron (ZOFRAN ODT) 4 MG disintegrating tablet Take 1 tablet (4 mg total) by mouth every 8 (eight) hours as needed for nausea  or vomiting. 08/05/17   Lorin Glass, [name]  pantoprazole (PROTONIX) 40 MG tablet Take 1 tablet (40 mg total) by mouth daily before breakfast. 04/04/19   Levin Erp, PA  predniSONE (DELTASONE) 20 MG tablet Take 1 tablet (20 mg total) by mouth 2 (two) times daily with a meal. 08/04/18   Raylene Everts, MD  sevelamer carbonate (RENVELA) 2.4 G PACK Take 2.4 g by mouth 3 (three) times daily with meals.     [provider]  traMADol (ULTRAM) 50 MG tablet Take 1 tablet (50 mg total) by mouth every 12 (twelve) hours as needed. Patient not taking: Reported on 04/04/2019 03/09/19   Meredith Pel, MD    Family History Family History  Problem Relation Age of Onset  . Hypertension Mother   . Diabetes Father   . Kidney Stones Sister   . Diabetes Maternal Grandmother   . Liver cancer Maternal Uncle     Social History Social History   Tobacco Use  . Smoking status: Former Smoker    Packs/day: 0.10    Years: 16.00    Pack years: 1.60    Types: Cigarettes  Quit date: 01/17/2019    Years since quitting: 0.2  . Smokeless tobacco: Never Used  Substance Use Topics  . Alcohol use: No    Alcohol/week: 0.0 standard drinks  . Drug use: No     Allergies   Dilaudid [hydromorphone hcl], Amlodipine, Ascorbate, and Midodrine   Review of Systems Review of Systems  Gastrointestinal: Positive for vomiting.  All other systems reviewed and are negative.    Physical Exam Updated Vital Signs BP 118/70   Pulse 77   Temp 99.1 F (37.3 C)   Resp 18   Ht 5\' 6"  (1.676 m)   Wt (!) 142.4 kg   SpO2 99%   BMI 50.68 kg/m   Physical Exam Vitals signs and nursing note reviewed.  Constitutional:      General: He is not in acute distress.    Appearance: He is well-developed.  HENT:     Head: Normocephalic and atraumatic.  Eyes:     Conjunctiva/sclera: Conjunctivae normal.     Pupils: Pupils are equal, round, and reactive to light.  Neck:     Musculoskeletal: Normal  range of motion and neck supple.  Cardiovascular:     Rate and Rhythm: Normal rate and regular rhythm.     Heart sounds: Normal heart sounds.  Pulmonary:     Effort: Pulmonary effort is normal. No respiratory distress.     Breath sounds: Normal breath sounds.  Abdominal:     General: There is no distension.     Palpations: Abdomen is soft.     Tenderness: There is no abdominal tenderness.  Musculoskeletal: Normal range of motion.        General: No deformity.  Skin:    General: Skin is warm and dry.  Neurological:     General: No focal deficit present.     Mental Status: He is alert and oriented to person, place, and time.      ED Treatments / Results  Labs (all labs ordered are listed, but only abnormal results are displayed) Labs Reviewed  COMPREHENSIVE METABOLIC PANEL - Abnormal; Notable for the following components:      Result Value   Potassium 3.1 (*)    Chloride 90 (*)    Glucose, Bld 113 (*)    BUN 31 (*)    Creatinine, Ser 9.79 (*)    Calcium 11.3 (*)    Total Protein 9.0 (*)    AST 14 (*)    GFR calc non Af Amer 6 (*)    GFR calc Af Amer 7 (*)    Anion gap 24 (*)    All other components within normal limits  LIPASE, BLOOD  CBC  URINALYSIS, ROUTINE W REFLEX MICROSCOPIC    EKG None  Radiology No results found.  Procedures Procedures (including critical care time)  Medications Ordered in ED Medications  sodium chloride flush (NS) 0.9 % injection 3 mL (3 mLs Intravenous Given 04/13/19 1353)  metoCLOPramide (REGLAN) injection 10 mg (10 mg Intravenous Given 04/13/19 1353)  famotidine (PEPCID) IVPB 20 mg premix (0 mg Intravenous Stopped 04/13/19 1426)  LORazepam (ATIVAN) injection 0.5 mg (0.5 mg Intravenous Given 04/13/19 1526)     Initial Impression / Assessment and Plan / ED Course  I have reviewed the triage vital signs and the nursing notes.  Pertinent labs & imaging results that were available during my care of the patient were reviewed by me and  considered in my medical decision making (see chart for details).  MDM  Screen complete  DAQUON ZIEMBA was evaluated in Emergency Department on 04/13/2019 for the symptoms described in the history of present illness. He was evaluated in the context of the global COVID-19 pandemic, which necessitated consideration that the patient might be at risk for infection with the SARS-CoV-2 virus that causes COVID-19. Institutional protocols and algorithms that pertain to the evaluation of patients at risk for COVID-19 are in a state of rapid change based on information released by regulatory bodies including the CDC and federal and state organizations. These policies and algorithms were followed during the patient's care in the ED.   Presenting for evaluation of nausea and associated vomiting.  Patient symptoms appear to be acute on chronic.  He does appear to have a likely diagnosis of gastroparesis.  Screening labs obtained in the ED are without significant abnormality.  Following his ED evaluation and treatment.  He feels improved.  Importance of close follow-up is stressed.  Strict return precautions given and understood.  Final Clinical Impressions(s) / ED Diagnoses   Final diagnoses:  Nausea and vomiting, intractability of vomiting not specified, unspecified vomiting type    ED Discharge Orders    None       Valarie Merino, MD 04/13/19 1601

## 2019-04-13 NOTE — ED Notes (Signed)
Pt experienced an episode of nausea and vomiting immediately after Reglan administration. Pt placed in position of comfort and given a cool wash cloth. Will reevaluate N/V.

## 2019-04-13 NOTE — Discharge Instructions (Addendum)
Please return for any problem.  Follow-up with your regular care provider as instructed. °

## 2019-04-25 ENCOUNTER — Other Ambulatory Visit: Payer: Self-pay

## 2019-04-25 ENCOUNTER — Ambulatory Visit (HOSPITAL_COMMUNITY)
Admission: RE | Admit: 2019-04-25 | Discharge: 2019-04-25 | Disposition: A | Discharge status: Home / Self Care | Payer: Medicare Other | Source: Ambulatory Visit | Attending: Physician Assistant | Admitting: Physician Assistant

## 2019-04-25 DIAGNOSIS — R1310 Dysphagia, unspecified: Secondary | ICD-10-CM | POA: Insufficient documentation

## 2019-04-27 ENCOUNTER — Emergency Department (HOSPITAL_COMMUNITY): Payer: Medicare Other

## 2019-04-27 ENCOUNTER — Emergency Department (HOSPITAL_COMMUNITY)
Admission: EM | Admit: 2019-04-27 | Discharge: 2019-04-27 | Disposition: A | Discharge status: Home / Self Care | Payer: Medicare Other | Attending: Emergency Medicine | Admitting: Emergency Medicine

## 2019-04-27 ENCOUNTER — Encounter (HOSPITAL_COMMUNITY): Payer: Self-pay | Admitting: Emergency Medicine

## 2019-04-27 DIAGNOSIS — Z87891 Personal history of nicotine dependence: Secondary | ICD-10-CM | POA: Insufficient documentation

## 2019-04-27 DIAGNOSIS — Z79899 Other long term (current) drug therapy: Secondary | ICD-10-CM | POA: Insufficient documentation

## 2019-04-27 DIAGNOSIS — Z992 Dependence on renal dialysis: Secondary | ICD-10-CM | POA: Diagnosis not present

## 2019-04-27 DIAGNOSIS — N186 End stage renal disease: Secondary | ICD-10-CM | POA: Insufficient documentation

## 2019-04-27 DIAGNOSIS — R1084 Generalized abdominal pain: Secondary | ICD-10-CM | POA: Diagnosis not present

## 2019-04-27 DIAGNOSIS — R112 Nausea with vomiting, unspecified: Secondary | ICD-10-CM | POA: Insufficient documentation

## 2019-04-27 DIAGNOSIS — I120 Hypertensive chronic kidney disease with stage 5 chronic kidney disease or end stage renal disease: Secondary | ICD-10-CM | POA: Insufficient documentation

## 2019-04-27 LAB — CBC
HCT: 38.4 % — ABNORMAL LOW (ref 39.0–52.0)
Hemoglobin: 12.9 g/dL — ABNORMAL LOW (ref 13.0–17.0)
MCH: 30.7 pg (ref 26.0–34.0)
MCHC: 33.6 g/dL (ref 30.0–36.0)
MCV: 91.4 fL (ref 80.0–100.0)
Platelets: 243 10*3/uL (ref 150–400)
RBC: 4.2 MIL/uL — ABNORMAL LOW (ref 4.22–5.81)
RDW: 13.2 % (ref 11.5–15.5)
WBC: 10.5 10*3/uL (ref 4.0–10.5)
nRBC: 0 % (ref 0.0–0.2)

## 2019-04-27 LAB — LIPASE, BLOOD: Lipase: 26 U/L (ref 11–51)

## 2019-04-27 LAB — COMPREHENSIVE METABOLIC PANEL
ALT: 31 U/L (ref 0–44)
AST: 22 U/L (ref 15–41)
Albumin: 4.2 g/dL (ref 3.5–5.0)
Alkaline Phosphatase: 49 U/L (ref 38–126)
Anion gap: 21 — ABNORMAL HIGH (ref 5–15)
BUN: 36 mg/dL — ABNORMAL HIGH (ref 6–20)
CO2: 26 mmol/L (ref 22–32)
Calcium: 10.7 mg/dL — ABNORMAL HIGH (ref 8.9–10.3)
Chloride: 93 mmol/L — ABNORMAL LOW (ref 98–111)
Creatinine, Ser: 9.8 mg/dL — ABNORMAL HIGH (ref 0.61–1.24)
GFR calc Af Amer: 7 mL/min — ABNORMAL LOW (ref >60)
GFR calc non Af Amer: 6 mL/min — ABNORMAL LOW (ref >60)
Glucose, Bld: 124 mg/dL — ABNORMAL HIGH (ref 70–99)
Potassium: 3.2 mmol/L — ABNORMAL LOW (ref 3.5–5.1)
Sodium: 140 mmol/L (ref 135–145)
Total Bilirubin: 0.7 mg/dL (ref 0.3–1.2)
Total Protein: 8.2 g/dL — ABNORMAL HIGH (ref 6.5–8.1)

## 2019-04-27 MED ORDER — FAMOTIDINE IN NACL 20-0.9 MG/50ML-% IV SOLN
20.0000 mg | Freq: Once | INTRAVENOUS | Status: AC
  Administered 2019-04-27: 20 mg via INTRAVENOUS
  Filled 2019-04-27: qty 50

## 2019-04-27 MED ORDER — METOCLOPRAMIDE HCL 5 MG PO TABS: 5.0000 mg | ORAL_TABLET | Freq: Two times a day (BID) | ORAL | 0 refills | Status: DC | PRN

## 2019-04-27 MED ORDER — PANTOPRAZOLE SODIUM 20 MG PO TBEC: 20.0000 mg | DELAYED_RELEASE_TABLET | Freq: Every day | ORAL | 0 refills | Status: DC

## 2019-04-27 MED ORDER — METOCLOPRAMIDE HCL 5 MG/ML IJ SOLN
10.0000 mg | Freq: Once | INTRAMUSCULAR | Status: AC
  Administered 2019-04-27: 10 mg via INTRAVENOUS
  Filled 2019-04-27: qty 2

## 2019-04-27 MED ORDER — SODIUM CHLORIDE 0.9% FLUSH: 3.0000 mL | Freq: Once | INTRAVENOUS | Status: DC

## 2019-04-27 MED ORDER — FAMOTIDINE 20 MG PO TABS: 20.0000 mg | ORAL_TABLET | Freq: Every day | ORAL | 0 refills | Status: AC

## 2019-04-27 MED ORDER — IOHEXOL 300 MG/ML  SOLN
100.0000 mL | Freq: Once | INTRAMUSCULAR | Status: AC | PRN
  Administered 2019-04-27: 19:00:00 100 mL via INTRAVENOUS

## 2019-04-27 NOTE — ED Provider Notes (Signed)
[facility] EMERGENCY DEPARTMENT Provider Note   CSN: [id number] Arrival date & time: 04/27/19  1111     History   Chief Complaint Chief Complaint  Patient presents with   Abdominal Pain    HPI [name] is a 33 y.o. male with history of end-stage renal disease on dialysis x15 years, hypertension, morbid obesity, chronic back pain, GERD presents today for abdominal pain nausea and vomiting.  Patient reports that yesterday after dialysis he developed a periumbilic abdominal pain with nausea and nonbloody/nonbilious emesis.  Symptoms have continued to today he describes a moderate aching sensation constant worsened with vomiting and without alleviating factors he attempted Protonix and Reglan at home without relief this morning.  He reports that these symptoms happen quite often after dialysis and may be related to his gastroparesis.  He denies abnormal factors regarding this pain today.  Denies fever/chills, headache/vision changes, neck pain, chest pain/shortness of breath, cough, diarrhea, testicular pain/swelling, numbness/tingling, weakness or any additional concerns.    HPI  Past Medical History:  Diagnosis Date   Arthritis    "all over" (11/19/2016)   Chronic lower back pain    Complication of anesthesia    A little while to wake up after knee surgery in 2008   Dizziness    when coming off of dialysis   ESRD (end stage renal disease) on dialysis [facility])    MWF and goes to [facility] (11/19/2016)   Family history of anesthesia complication    mom slow to wake up   GERD (gastroesophageal reflux disease)    takes Omeprazole as needed   Headache(784.0)    "monthly" (11/19/2016)   History of hiatal hernia    Hyperparathyroidism ([place])    Hypertension    Joint pain    Joint swelling    Morbid obesity ([place])    PONV (postoperative nausea and vomiting)    Renal insufficiency    Pt is on dyalisis and has been for 14 years    Patient  Active Problem List   Diagnosis Date Noted   Abdominal pain 07/04/2017   Gastritis and gastroduodenitis 11/19/2016   Increased anion gap metabolic acidosis [id number]   Intractable abdominal pain    Common bile duct dilatation    Cholelithiasis without cholecystitis    Intractable nausea and vomiting 10/08/2016   Hematemesis 08/14/2016   GERD (gastroesophageal reflux disease) 08/14/2016   Essential hypertension 08/14/2016   GIB (gastrointestinal bleeding) 08/14/2016   SIRS (systemic inflammatory response syndrome) ([place]) 08/14/2016   CKD (chronic kidney disease) stage V requiring chronic dialysis ([place]) 06/11/2016   Non-intractable vomiting with nausea 06/11/2016   Hepatic steatosis 06/11/2016   Cholelithiasis 06/11/2016   Localized osteoarthritis of left knee 07/11/2015   Abdominal pain, epigastric 12/05/2014   Lapband April 2016 11/27/2014   Arthritis of knee 03/27/2014   Morbid obesity, weight 291, BMI - 47 02/15/2014   Hyperparathyroidism, secondary ([place]) 03/14/2013    Past Surgical History:  Procedure Laterality Date   AV FISTULA PLACEMENT Bilateral 2005,2007   "right; left"   AV FISTULA REPAIR Right 2007   "tried to clean it out; couldn't do it"   [place] N/A 05/15/2014   Procedure: BREATH TEK H PYLORI;  Surgeon: Alphonsa Overall, MD;  Location: [facility];  Service: General;  Laterality: N/A;   CHOLECYSTECTOMY N/A 11/23/2016   Procedure: LAPAROSCOPIC CHOLECYSTECTOMY WITH INTRAOPERATIVE CHOLANGIOGRAM;  Surgeon: Erroll Luna, MD;  Location: [facility];  Service: General;  Laterality: N/A;  ESOPHAGOGASTRODUODENOSCOPY N/A 12/07/2014   Procedure: ESOPHAGOGASTRODUODENOSCOPY (EGD);  Surgeon: Alphonsa Overall, MD;  Location: [facility] ENDOSCOPY;  Service: General;  Laterality: N/A;   ESOPHAGOGASTRODUODENOSCOPY N/A 12/17/2014   Procedure: ESOPHAGOGASTRODUODENOSCOPY (EGD);  Surgeon: Alphonsa Overall, MD;  Location: [place];  Service: General;  Laterality: N/A;    ESOPHAGOGASTRODUODENOSCOPY N/A 08/16/2016   Procedure: ESOPHAGOGASTRODUODENOSCOPY (EGD);  Surgeon: Jerene Bears, MD;  Location: [facility];  Service: Gastroenterology;  Laterality: N/A;   ESOPHAGOGASTRODUODENOSCOPY (EGD) WITH PROPOFOL N/A 07/05/2017   Procedure: ESOPHAGOGASTRODUODENOSCOPY (EGD) WITH PROPOFOL;  Surgeon: Milus Banister, MD;  Location: [facility] ENDOSCOPY;  Service: Endoscopy;  Laterality: N/A;   HERNIA REPAIR  11/2014   w/lap band OR   JOINT REPLACEMENT     KNEE ARTHROSCOPY Left 2007   LAPAROSCOPIC GASTRIC BANDING N/A 11/12/2014   Procedure: LAPAROSCOPIC GASTRIC BANDING;  Surgeon: Alphonsa Overall, MD;  Location: [facility];  Service: General;  Laterality: N/A;   PARATHYROIDECTOMY N/A 03/30/2013   Procedure: TOTAL PARATHYROIDECTOMY WITH AUTOTRANSPLANT;  Surgeon: Earnstine Regal, MD;  Location: [place];  Service: General;  Laterality: N/A;  Autotransplant to left lower arm.   TOTAL KNEE ARTHROPLASTY Right 03/27/2014   Procedure: UNICOMPARTMENTAL ARTHROPLASTY;  Surgeon: Meredith Pel, MD;  Location: [place];  Service: Orthopedics;  Laterality: Right;        Home Medications    Prior to Admission medications   Medication Sig Start Date End Date Taking? Authorizing Provider  albuterol (PROVENTIL HFA;VENTOLIN HFA) 108 (90 Base) MCG/ACT inhaler Inhale 2 puffs into the lungs every 4 (four) hours as needed for wheezing or shortness of breath. 05/14/16   Lysbeth Penner, [name]  famotidine (PEPCID) 20 MG tablet Take 1 tablet (20 mg total) by mouth daily. 04/27/19   Nuala Alpha A, [name]  metoCLOPramide (REGLAN) 5 MG tablet Take 1 tablet (5 mg total) by mouth 2 (two) times daily as needed for nausea. 04/27/19   Nuala Alpha A, [name]  pantoprazole (PROTONIX) 20 MG tablet Take 1 tablet (20 mg total) by mouth daily. 04/27/19   Deliah Boston, [name]  predniSONE (DELTASONE) 20 MG tablet Take 1 tablet (20 mg total) by mouth 2 (two) times daily with a meal. Patient not taking: Reported on  04/13/2019 08/04/18   Raylene Everts, MD  sevelamer carbonate (RENVELA) 2.4 G PACK Take 2.4 g by mouth 3 (three) times daily with meals.     [provider]  traMADol (ULTRAM) 50 MG tablet Take 1 tablet (50 mg total) by mouth every 12 (twelve) hours as needed. 03/09/19   Meredith Pel, MD    Family History Family History  Problem Relation Age of Onset   Hypertension Mother    Diabetes Father    Kidney Stones Sister    Diabetes Maternal Grandmother    Liver cancer Maternal Uncle     Social History Social History   Tobacco Use   Smoking status: Former Smoker    Packs/day: 0.10    Years: 16.00    Pack years: 1.60    Types: Cigarettes    Quit date: 01/17/2019    Years since quitting: 0.2   Smokeless tobacco: Never Used  Substance Use Topics   Alcohol use: No    Alcohol/week: 0.0 standard drinks   Drug use: No     Allergies   Dilaudid [hydromorphone hcl], Amlodipine, Ascorbate, and Midodrine   Review of Systems Review of Systems Ten systems are reviewed and are negative for acute change except as noted in the HPI  Physical Exam Updated Vital Signs BP 117/72    Pulse 80    Temp 98.5 F (36.9 C) (Oral)    Resp 15    SpO2 97%   Physical Exam Constitutional:      General: He is not in acute distress.    Appearance: Normal appearance. He is well-developed. He is not ill-appearing or diaphoretic.  HENT:     Head: Normocephalic and atraumatic.     Right Ear: External ear normal.     Left Ear: External ear normal.     Nose: Nose normal.  Eyes:     General: Vision grossly intact. Gaze aligned appropriately.     Pupils: Pupils are equal, round, and reactive to light.  Neck:     Musculoskeletal: Normal range of motion.     Trachea: Trachea and phonation normal. No tracheal deviation.  Pulmonary:     Effort: Pulmonary effort is normal. No respiratory distress.  Abdominal:     General: There is no distension.     Palpations: Abdomen is soft.      Tenderness: There is generalized abdominal tenderness. There is no guarding or rebound.  Musculoskeletal: Normal range of motion.  Skin:    General: Skin is warm and dry.  Neurological:     Mental Status: He is alert.     GCS: GCS eye subscore is 4. GCS verbal subscore is 5. GCS motor subscore is 6.     Comments: Speech is clear and goal oriented, follows commands Major Cranial nerves without deficit, no facial droop Moves extremities without ataxia, coordination intact  Psychiatric:        Behavior: Behavior normal.      ED Treatments / Results  Labs (all labs ordered are listed, but only abnormal results are displayed) Labs Reviewed  COMPREHENSIVE METABOLIC PANEL - Abnormal; Notable for the following components:      Result Value   Potassium 3.2 (*)    Chloride 93 (*)    Glucose, Bld 124 (*)    BUN 36 (*)    Creatinine, Ser 9.80 (*)    Calcium 10.7 (*)    Total Protein 8.2 (*)    GFR calc non Af Amer 6 (*)    GFR calc Af Amer 7 (*)    Anion gap 21 (*)    All other components within normal limits  CBC - Abnormal; Notable for the following components:   RBC 4.20 (*)    Hemoglobin 12.9 (*)    HCT 38.4 (*)    All other components within normal limits  LIPASE, BLOOD    EKG None  Radiology Ct Abdomen Pelvis W Contrast  Result Date: 04/27/2019 CLINICAL DATA:  Abdominal pain beginning yesterday with nausea and vomiting. Dialysis patient, last treatment yesterday. EXAM: CT ABDOMEN AND PELVIS WITH CONTRAST TECHNIQUE: Multidetector CT imaging of the abdomen and pelvis was performed using the standard protocol following bolus administration of intravenous contrast. CONTRAST:  140mL OMNIPAQUE IOHEXOL 300 MG/ML  SOLN COMPARISON:  07/04/2017 FINDINGS: Lower chest: No acute abnormality. Hepatobiliary: No focal liver abnormality is seen. Status post cholecystectomy. No biliary dilatation. Pancreas: Unremarkable. No pancreatic ductal dilatation or surrounding inflammatory changes.  Spleen: Normal in size without focal abnormality. Adrenals/Urinary Tract: No adrenal masses. Atrophic kidneys with numerous cysts. Several parenchymal calcifications. No hydronephrosis. Ureters decompressed, normal in course, with no stones. Bladder is decompressed. Stomach/Bowel: Stomach is within normal limits. Appendix appears normal. No evidence of bowel wall thickening, distention, or inflammatory changes. Vascular/Lymphatic: No significant  vascular findings are present. No enlarged abdominal or pelvic lymph nodes. Reproductive: Unremarkable. Other: No abdominal wall hernia or abnormality. No abdominopelvic ascites. Musculoskeletal: No acute or significant osseous findings. IMPRESSION: 1. No acute findings. No findings to account for the patient's abdominal pain or nausea and vomiting. 2. Atrophic kidneys with numerous cysts similar to the prior CT. Electronically Signed   By: Lajean Manes M.D.   On: 04/27/2019 19:42    Procedures Procedures (including critical care time)  Medications Ordered in ED Medications  sodium chloride flush (NS) 0.9 % injection 3 mL (has no administration in time range)  famotidine (PEPCID) IVPB 20 mg premix (0 mg Intravenous Stopped 04/27/19 1924)  metoCLOPramide (REGLAN) injection 10 mg (10 mg Intravenous Given 04/27/19 1840)  iohexol (OMNIPAQUE) 300 MG/ML solution 100 mL (100 mLs Intravenous Contrast Given 04/27/19 1919)     Initial Impression / Assessment and Plan / ED Course  I have reviewed the triage vital signs and the nursing notes.  Pertinent labs & imaging results that were available during my care of the patient were reviewed by me and considered in my medical decision making (see chart for details).    CBC nonacute Lipase within normal limits CMP appears consistent with patient's baseline ESRD status - Patient seen and evaluated by Dr. Roslynn Amble agrees with CT abdomen pelvis, anticipate discharge. - CT abdomen pelvis:  IMPRESSION:  1. No acute  findings. No findings to account for the patient's  abdominal pain or nausea and vomiting.  2. Atrophic kidneys with numerous cysts similar to the prior CT.  - Patient reevaluated resting comfortably and in no acute distress.  Reports relief of symptoms following Reglan and Pepcid.  Reports that he is feeling well and would like to be discharged.  He will be discharged with Pepcid, Reglan and Protonix and encouraged to follow-up with PCP this week.  On reexamination abdomen is soft nontender and without peritoneal signs.  At this time there does not appear to be any evidence of an acute emergency medical condition and the patient appears stable for discharge with appropriate outpatient follow up. Diagnosis was discussed with patient who verbalizes understanding of care plan and is agreeable to discharge. I have discussed return precautions with patient who verbalizes understanding of return precautions. Patient encouraged to follow-up with their PCP and GI. All questions answered.  Patient's case rediscussed with Dr. Roslynn Amble who agrees with plan to discharge with Pepcid, Reglan, Protonix and outpatient follow-up.   Note: Portions of this report may have been transcribed using voice recognition software. Every effort was made to ensure accuracy; however, inadvertent computerized transcription errors may still be present. Final Clinical Impressions(s) / ED Diagnoses   Final diagnoses:  Generalized abdominal pain    ED Discharge Orders         Ordered    famotidine (PEPCID) 20 MG tablet  Daily     04/27/19 2021    pantoprazole (PROTONIX) 20 MG tablet  Daily     04/27/19 2021    metoCLOPramide (REGLAN) 5 MG tablet  2 times daily PRN     04/27/19 2021           Deliah Boston, [name] 04/27/19 2025    Lucrezia Starch, MD 04/29/19 [phone]

## 2019-04-27 NOTE — ED Triage Notes (Signed)
Pt reports mid abd pain that started yesterday with n/v. Pt is dialysis pt last treatment was yesterday.

## 2019-04-27 NOTE — ED Notes (Signed)
Pt states he doesn't make urinie

## 2019-04-27 NOTE — ED Notes (Signed)
Pt states he does not produce urine for urine sample.

## 2019-04-27 NOTE — ED Notes (Signed)
Patient verbalizes understanding of discharge instructions. Opportunity for questioning and answers were provided. Armband removed by staff, pt discharged from ED ambulatory.   

## 2019-04-27 NOTE — ED Notes (Signed)
Called patient for vital check patient didn't answer

## 2019-04-27 NOTE — Discharge Instructions (Signed)
You have been diagnosed today with Abdominal Pain.  At this time there does not appear to be the presence of an emergent medical condition, however there is always the potential for conditions to change. Please read and follow the below instructions.  Please return to the Emergency Department immediately for any new or worsening symptoms or if your symptoms do not improve within 2 days. Please be sure to follow up with your Primary Care Provider within one week regarding your visit today; please call their office to schedule an appointment even if you are feeling better for a follow-up visit. You may take the medications Protonix, Reglan and Pepcid as prescribed to help with your symptoms.  You may also follow-up with the specialist at [facility] gastroenterology on your discharge paperwork for further evaluation of your recurrent abdominal pain.  Please be sure to get plenty of rest over the next few days.  Get help right away if: Your pain does not go away as soon as your doctor says it should. You have chest pain or shortness of breath You have fever or chills You cannot stop throwing up. Your pain is only in areas of your belly, such as the right side or the left lower part of the belly. You have bloody or black poop, or poop that looks like tar. You have very bad pain, cramping, or bloating in your belly. You have any new/concerning or worsening symptoms  Please read the additional information packets attached to your discharge summary.  Do not take your medicine if  develop an itchy rash, swelling in your mouth or lips, or difficulty breathing; call 911 and seek immediate emergency medical attention if this occurs.  Note: Portions of this text may have been transcribed using voice recognition software. Every effort was made to ensure accuracy; however, inadvertent computerized transcription errors may still be present.

## 2019-04-29 ENCOUNTER — Other Ambulatory Visit: Payer: Self-pay

## 2019-04-29 ENCOUNTER — Emergency Department (HOSPITAL_COMMUNITY)
Admission: EM | Admit: 2019-04-29 | Discharge: 2019-04-29 | Disposition: A | Discharge status: Home / Self Care | Payer: Medicare Other | Attending: Emergency Medicine | Admitting: Emergency Medicine

## 2019-04-29 DIAGNOSIS — Z79899 Other long term (current) drug therapy: Secondary | ICD-10-CM | POA: Diagnosis not present

## 2019-04-29 DIAGNOSIS — I120 Hypertensive chronic kidney disease with stage 5 chronic kidney disease or end stage renal disease: Secondary | ICD-10-CM | POA: Diagnosis not present

## 2019-04-29 DIAGNOSIS — Z9884 Bariatric surgery status: Secondary | ICD-10-CM | POA: Diagnosis not present

## 2019-04-29 DIAGNOSIS — R112 Nausea with vomiting, unspecified: Secondary | ICD-10-CM | POA: Diagnosis not present

## 2019-04-29 DIAGNOSIS — N186 End stage renal disease: Secondary | ICD-10-CM | POA: Diagnosis not present

## 2019-04-29 DIAGNOSIS — R1084 Generalized abdominal pain: Secondary | ICD-10-CM | POA: Diagnosis not present

## 2019-04-29 DIAGNOSIS — R197 Diarrhea, unspecified: Secondary | ICD-10-CM | POA: Diagnosis not present

## 2019-04-29 DIAGNOSIS — Z992 Dependence on renal dialysis: Secondary | ICD-10-CM | POA: Diagnosis not present

## 2019-04-29 DIAGNOSIS — Z87891 Personal history of nicotine dependence: Secondary | ICD-10-CM | POA: Insufficient documentation

## 2019-04-29 LAB — COMPREHENSIVE METABOLIC PANEL
ALT: 24 U/L (ref 0–44)
AST: 17 U/L (ref 15–41)
Albumin: 4.3 g/dL (ref 3.5–5.0)
Alkaline Phosphatase: 47 U/L (ref 38–126)
Anion gap: 18 — ABNORMAL HIGH (ref 5–15)
BUN: 18 mg/dL (ref 6–20)
CO2: 24 mmol/L (ref 22–32)
Calcium: 10.9 mg/dL — ABNORMAL HIGH (ref 8.9–10.3)
Chloride: 93 mmol/L — ABNORMAL LOW (ref 98–111)
Creatinine, Ser: 9.29 mg/dL — ABNORMAL HIGH (ref 0.61–1.24)
GFR calc Af Amer: 8 mL/min — ABNORMAL LOW (ref >60)
GFR calc non Af Amer: 7 mL/min — ABNORMAL LOW (ref >60)
Glucose, Bld: 107 mg/dL — ABNORMAL HIGH (ref 70–99)
Potassium: 3 mmol/L — ABNORMAL LOW (ref 3.5–5.1)
Sodium: 135 mmol/L (ref 135–145)
Total Bilirubin: 0.8 mg/dL (ref 0.3–1.2)
Total Protein: 8.2 g/dL — ABNORMAL HIGH (ref 6.5–8.1)

## 2019-04-29 LAB — LIPASE, BLOOD: Lipase: 28 U/L (ref 11–51)

## 2019-04-29 LAB — CBC
HCT: 38.7 % — ABNORMAL LOW (ref 39.0–52.0)
Hemoglobin: 13.2 g/dL (ref 13.0–17.0)
MCH: 31.1 pg (ref 26.0–34.0)
MCHC: 34.1 g/dL (ref 30.0–36.0)
MCV: 91.1 fL (ref 80.0–100.0)
Platelets: 270 10*3/uL (ref 150–400)
RBC: 4.25 MIL/uL (ref 4.22–5.81)
RDW: 13 % (ref 11.5–15.5)
WBC: 11.8 10*3/uL — ABNORMAL HIGH (ref 4.0–10.5)
nRBC: 0 % (ref 0.0–0.2)

## 2019-04-29 MED ORDER — PANTOPRAZOLE SODIUM 40 MG IV SOLR
40.0000 mg | Freq: Once | INTRAVENOUS | Status: AC
  Administered 2019-04-29: 40 mg via INTRAVENOUS
  Filled 2019-04-29: qty 40

## 2019-04-29 MED ORDER — METOCLOPRAMIDE HCL 5 MG/ML IJ SOLN
10.0000 mg | Freq: Once | INTRAMUSCULAR | Status: AC
  Administered 2019-04-29: 10 mg via INTRAVENOUS
  Filled 2019-04-29: qty 2

## 2019-04-29 MED ORDER — SODIUM CHLORIDE 0.9% FLUSH
3.0000 mL | Freq: Once | INTRAVENOUS | Status: AC
  Administered 2019-04-29: 3 mL via INTRAVENOUS

## 2019-04-29 NOTE — ED Notes (Signed)
Pt cannot produce urine

## 2019-04-29 NOTE — ED Notes (Signed)
Pt tolerating oral fluids with no n/v

## 2019-04-29 NOTE — Discharge Instructions (Signed)
It is very important that you fill your medications at the pharmacy and follow up with your GI surgeon to schedule your EGD. Thank you for allowing me to care for you today. Please return to the emergency department if you have new or worsening symptoms. Take your medications as instructed.

## 2019-04-29 NOTE — ED Triage Notes (Signed)
Pt presents with generalized abd pain, weakness and vomiting x1 day, started after completing dialysis. He did receive a full dialysis treatment. M/W/F

## 2019-04-29 NOTE — ED Provider Notes (Signed)
[facility] EMERGENCY DEPARTMENT Provider Note   CSN: [id number] Arrival date & time: 04/29/19  1135     History   Chief Complaint Chief Complaint  Patient presents with  . Abdominal Pain    HPI [name] is a 33 y.o. male.     Patient is a obese male with past medical history of end-stage renal disease on dialysis, hyperparathyroidism, hypertension, status post gastric bypass surgery a couple years ago presents emergency department for abdominal pain.  Patient had the same symptoms several times in the past.  Patient reports that this starts shortly after dialysis.   Was seen here in the emergency department and had a negative work-up including negative CT scan 2 days ago.  Also had a negative esophagram 4 days ago.  He has not felt the medication for symptoms of pharmacy upon discharge and his last ED visit.  He also has not followed up with his surgeon.  Reports that he has diffuse abdominal pain.  He had a couple episodes of diarrhea which is resolved.  Reports that he vomits multiple times, last time was 5 AM this morning.  Reports that he may occasionally have some coffee-ground emesis.     Past Medical History:  Diagnosis Date  . Arthritis    "all over" (11/19/2016)  . Chronic lower back pain   . Complication of anesthesia    A little while to wake up after knee surgery in 2008  . Dizziness    when coming off of dialysis  . ESRD (end stage renal disease) on dialysis [facility])    MWF and goes to [facility] (11/19/2016)  . Family history of anesthesia complication    mom slow to wake up  . GERD (gastroesophageal reflux disease)    takes Omeprazole as needed  . [id number])    "monthly" (11/19/2016)  . History of hiatal hernia   . Hyperparathyroidism ([place])   . Hypertension   . Joint pain   . Joint swelling   . Morbid obesity ([place])   . PONV (postoperative nausea and vomiting)   . Renal insufficiency    Pt is on dyalisis and has been for 14  years    Patient Active Problem List   Diagnosis Date Noted  . Abdominal pain 07/04/2017  . Gastritis and gastroduodenitis 11/19/2016  . Increased anion gap metabolic acidosis [id number]  . Intractable abdominal pain   . Common bile duct dilatation   . Cholelithiasis without cholecystitis   . Intractable nausea and vomiting 10/08/2016  . Hematemesis 08/14/2016  . GERD (gastroesophageal reflux disease) 08/14/2016  . Essential hypertension 08/14/2016  . GIB (gastrointestinal bleeding) 08/14/2016  . SIRS (systemic inflammatory response syndrome) ([place]) 08/14/2016  . CKD (chronic kidney disease) stage V requiring chronic dialysis ([place]) 06/11/2016  . Non-intractable vomiting with nausea 06/11/2016  . Hepatic steatosis 06/11/2016  . Cholelithiasis 06/11/2016  . Localized osteoarthritis of left knee 07/11/2015  . Abdominal pain, epigastric 12/05/2014  . Lapband April 2016 11/27/2014  . Arthritis of knee 03/27/2014  . Morbid obesity, weight 291, BMI - 47 02/15/2014  . Hyperparathyroidism, secondary ([place]) 03/14/2013    Past Surgical History:  Procedure Laterality Date  . AV FISTULA PLACEMENT Bilateral 2005,2007   "right; left"  . AV FISTULA REPAIR Right 2007   "tried to clean it out; couldn't do it"  . BREATH TEK H PYLORI N/A 05/15/2014   Procedure: BREATH TEK H PYLORI;  Surgeon: Alphonsa Overall, MD;  Location: [facility];  Service:  General;  Laterality: N/A;  . CHOLECYSTECTOMY N/A 11/23/2016   Procedure: LAPAROSCOPIC CHOLECYSTECTOMY WITH INTRAOPERATIVE CHOLANGIOGRAM;  Surgeon: Erroll Luna, MD;  Location: [facility];  Service: General;  Laterality: N/A;  . ESOPHAGOGASTRODUODENOSCOPY N/A 12/07/2014   Procedure: ESOPHAGOGASTRODUODENOSCOPY (EGD);  Surgeon: Alphonsa Overall, MD;  Location: [facility] ENDOSCOPY;  Service: General;  Laterality: N/A;  . ESOPHAGOGASTRODUODENOSCOPY N/A 12/17/2014   Procedure: ESOPHAGOGASTRODUODENOSCOPY (EGD);  Surgeon: Alphonsa Overall, MD;  Location: [facility];  Service: General;   Laterality: N/A;  . ESOPHAGOGASTRODUODENOSCOPY N/A 08/16/2016   Procedure: ESOPHAGOGASTRODUODENOSCOPY (EGD);  Surgeon: Jerene Bears, MD;  Location: [place];  Service: Gastroenterology;  Laterality: N/A;  . ESOPHAGOGASTRODUODENOSCOPY (EGD) WITH PROPOFOL N/A 07/05/2017   Procedure: ESOPHAGOGASTRODUODENOSCOPY (EGD) WITH PROPOFOL;  Surgeon: Milus Banister, MD;  Location: [facility] ENDOSCOPY;  Service: Endoscopy;  Laterality: N/A;  . HERNIA REPAIR  11/2014   w/lap band OR  . JOINT REPLACEMENT    . KNEE ARTHROSCOPY Left 2007  . LAPAROSCOPIC GASTRIC BANDING N/A 11/12/2014   Procedure: LAPAROSCOPIC GASTRIC BANDING;  Surgeon: Alphonsa Overall, MD;  Location: [facility];  Service: General;  Laterality: N/A;  . PARATHYROIDECTOMY N/A 03/30/2013   Procedure: TOTAL PARATHYROIDECTOMY WITH AUTOTRANSPLANT;  Surgeon: Earnstine Regal, MD;  Location: [place];  Service: General;  Laterality: N/A;  Autotransplant to left lower arm.  Marland Kitchen TOTAL KNEE ARTHROPLASTY Right 03/27/2014   Procedure: UNICOMPARTMENTAL ARTHROPLASTY;  Surgeon: Meredith Pel, MD;  Location: [facility];  Service: Orthopedics;  Laterality: Right;        Home Medications    Prior to Admission medications   Medication Sig Start Date End Date Taking? Authorizing Provider  albuterol (PROVENTIL HFA;VENTOLIN HFA) 108 (90 Base) MCG/ACT inhaler Inhale 2 puffs into the lungs every 4 (four) hours as needed for wheezing or shortness of breath. 05/14/16   Lysbeth Penner, [name]  famotidine (PEPCID) 20 MG tablet Take 1 tablet (20 mg total) by mouth daily. 04/27/19   Nuala Alpha A, [name]  metoCLOPramide (REGLAN) 5 MG tablet Take 1 tablet (5 mg total) by mouth 2 (two) times daily as needed for nausea. 04/27/19   Nuala Alpha A, [name]  pantoprazole (PROTONIX) 20 MG tablet Take 1 tablet (20 mg total) by mouth daily. 04/27/19   Deliah Boston, [name]  predniSONE (DELTASONE) 20 MG tablet Take 1 tablet (20 mg total) by mouth 2 (two) times daily with a meal. Patient not  taking: Reported on 04/13/2019 08/04/18   Raylene Everts, MD  sevelamer carbonate (RENVELA) 2.4 G PACK Take 2.4 g by mouth 3 (three) times daily with meals.     [provider]  traMADol (ULTRAM) 50 MG tablet Take 1 tablet (50 mg total) by mouth every 12 (twelve) hours as needed. 03/09/19   Meredith Pel, MD    Family History Family History  Problem Relation Age of Onset  . Hypertension Mother   . Diabetes Father   . Kidney Stones Sister   . Diabetes Maternal Grandmother   . Liver cancer Maternal Uncle     Social History Social History   Tobacco Use  . Smoking status: Former Smoker    Packs/day: 0.10    Years: 16.00    Pack years: 1.60    Types: Cigarettes    Quit date: 01/17/2019    Years since quitting: 0.2  . Smokeless tobacco: Never Used  Substance Use Topics  . Alcohol use: No    Alcohol/week: 0.0 standard drinks  . Drug use: No     Allergies  Dilaudid [hydromorphone hcl], Amlodipine, Ascorbate, and Midodrine   Review of Systems Review of Systems  Constitutional: Negative for appetite change, diaphoresis, fatigue and fever.  HENT: Negative for congestion, rhinorrhea and sinus pain.   Respiratory: Negative for cough and shortness of breath.   Cardiovascular: Negative for chest pain.  Gastrointestinal: Positive for abdominal pain, diarrhea, nausea and vomiting. Negative for abdominal distention, anal bleeding, blood in stool and constipation.  Genitourinary: Negative for dysuria.  Musculoskeletal: Negative for back pain.  Skin: Negative for rash.  Allergic/Immunologic: Negative for immunocompromised state.  Neurological: Negative for dizziness and light-headedness.  Psychiatric/Behavioral: Negative for confusion.     Physical Exam Updated Vital Signs BP 117/66 (BP Location: Right Arm)   Pulse 89   Temp 98.9 F (37.2 C) (Oral)   Resp 20   SpO2 98%   Physical Exam Constitutional:      General: He is not in acute distress.    Appearance:  He is well-developed. He is obese. He is not ill-appearing, toxic-appearing or diaphoretic.  HENT:     Head: Normocephalic and atraumatic.     Mouth/Throat:     Mouth: Mucous membranes are moist.  Cardiovascular:     Rate and Rhythm: Normal rate.  Pulmonary:     Effort: Pulmonary effort is normal.  Abdominal:     General: Abdomen is flat. Bowel sounds are normal.     Palpations: Abdomen is soft.     Tenderness: There is generalized abdominal tenderness. There is no right CVA tenderness, left CVA tenderness, guarding or rebound. Negative signs include Murphy's sign.  Skin:    General: Skin is warm.  Neurological:     Mental Status: He is alert.  Psychiatric:        Mood and Affect: Mood normal.      ED Treatments / Results  Labs (all labs ordered are listed, but only abnormal results are displayed) Labs Reviewed  COMPREHENSIVE METABOLIC PANEL - Abnormal; Notable for the following components:      Result Value   Potassium 3.0 (*)    Chloride 93 (*)    Glucose, Bld 107 (*)    Creatinine, Ser 9.29 (*)    Calcium 10.9 (*)    Total Protein 8.2 (*)    GFR calc non Af Amer 7 (*)    GFR calc Af Amer 8 (*)    Anion gap 18 (*)    All other components within normal limits  CBC - Abnormal; Notable for the following components:   WBC 11.8 (*)    HCT 38.7 (*)    All other components within normal limits  LIPASE, BLOOD  URINALYSIS, ROUTINE W REFLEX MICROSCOPIC    EKG None  Radiology Ct Abdomen Pelvis W Contrast  Result Date: 04/27/2019 CLINICAL DATA:  Abdominal pain beginning yesterday with nausea and vomiting. Dialysis patient, last treatment yesterday. EXAM: CT ABDOMEN AND PELVIS WITH CONTRAST TECHNIQUE: Multidetector CT imaging of the abdomen and pelvis was performed using the standard protocol following bolus administration of intravenous contrast. CONTRAST:  124mL OMNIPAQUE IOHEXOL 300 MG/ML  SOLN COMPARISON:  07/04/2017 FINDINGS: Lower chest: No acute abnormality.  Hepatobiliary: No focal liver abnormality is seen. Status post cholecystectomy. No biliary dilatation. Pancreas: Unremarkable. No pancreatic ductal dilatation or surrounding inflammatory changes. Spleen: Normal in size without focal abnormality. Adrenals/Urinary Tract: No adrenal masses. Atrophic kidneys with numerous cysts. Several parenchymal calcifications. No hydronephrosis. Ureters decompressed, normal in course, with no stones. Bladder is decompressed. Stomach/Bowel: Stomach is within normal limits. Appendix  appears normal. No evidence of bowel wall thickening, distention, or inflammatory changes. Vascular/Lymphatic: No significant vascular findings are present. No enlarged abdominal or pelvic lymph nodes. Reproductive: Unremarkable. Other: No abdominal wall hernia or abnormality. No abdominopelvic ascites. Musculoskeletal: No acute or significant osseous findings. IMPRESSION: 1. No acute findings. No findings to account for the patient's abdominal pain or nausea and vomiting. 2. Atrophic kidneys with numerous cysts similar to the prior CT. Electronically Signed   By: Lajean Manes M.D.   On: 04/27/2019 19:42    Procedures Procedures (including critical care time)  Medications Ordered in ED Medications  sodium chloride flush (NS) 0.9 % injection 3 mL (3 mLs Intravenous Given 04/29/19 1334)  pantoprazole (PROTONIX) injection 40 mg (40 mg Intravenous Given 04/29/19 1333)  metoCLOPramide (REGLAN) injection 10 mg (10 mg Intravenous Given 04/29/19 1334)     Initial Impression / Assessment and Plan / ED Course  I have reviewed the triage vital signs and the nursing notes.  Pertinent labs & imaging results that were available during my care of the patient were reviewed by me and considered in my medical decision making (see chart for details).  Clinical Course as of Apr 28 1452  Sat Apr 29, 2019  1340 Dialysis patient with abdominal pain. Acute on chronic. Recent negative CT scan and esophagram  within this week.  Labs are at his baseline. Vitals are stable, no fever.  No signs of dehydration, tolerating PO fluids > 6 oz after phenergan and protonix.   Lungs are clear.  No focal abdominal pain, no concern for appendicitis, cholecystitis, pancreatitis, ruptured viscus, UTI, kidney stone, or any other abdominal etiology.  Supportive therapy indicated with return if symptoms worsen.  Patient counseled that he also needs to follow up with his GI surgeon and fill his prescriptions at the pharmacy. I have read the GI surgeons last note.Marland Kitchen  He suspects that this is a motility problem and patient is to be scheduled for an EGD soon.    [KM]    Clinical Course User Index [KM] Alveria Apley, [name]       Based on review of vitals, medical screening exam, lab work and/or imaging, there does not appear to be an acute, emergent etiology for the patient's symptoms. Counseled pt on good return precautions and encouraged both PCP and ED follow-up as needed.  Prior to discharge, I also discussed incidental imaging findings with patient in detail and advised appropriate, recommended follow-up in detail.  Clinical Impression: 1. Generalized abdominal pain     Disposition: Discharge  Prior to providing a prescription for a controlled substance, I independently reviewed the patient's recent prescription history on the [place]. The patient had no recent or regular prescriptions and was deemed appropriate for a brief, less than 3 day prescription of narcotic for acute analgesia.  This note was prepared with assistance of Systems analyst. Occasional wrong-word or sound-a-like substitutions may have occurred due to the inherent limitations of voice recognition software.   Final Clinical Impressions(s) / ED Diagnoses   Final diagnoses:  Generalized abdominal pain    ED Discharge Orders    None       Kristine Royal 04/29/19 1453     Charlesetta Shanks, MD 04/30/19 1640

## 2019-05-02 ENCOUNTER — Other Ambulatory Visit: Payer: Self-pay | Admitting: *Deleted

## 2019-05-02 DIAGNOSIS — R112 Nausea with vomiting, unspecified: Secondary | ICD-10-CM

## 2019-05-03 ENCOUNTER — Encounter: Payer: Self-pay | Admitting: *Deleted

## 2019-05-04 ENCOUNTER — Encounter: Payer: Self-pay | Admitting: *Deleted

## 2019-05-08 ENCOUNTER — Other Ambulatory Visit: Payer: Self-pay

## 2019-05-08 MED ORDER — PANTOPRAZOLE SODIUM 40 MG PO TBEC: 40.0000 mg | DELAYED_RELEASE_TABLET | Freq: Every day | ORAL | 3 refills | Status: DC

## 2019-05-09 ENCOUNTER — Ambulatory Visit (AMBULATORY_SURGERY_CENTER): Payer: Self-pay | Admitting: *Deleted

## 2019-05-09 ENCOUNTER — Other Ambulatory Visit: Payer: Self-pay

## 2019-05-09 VITALS — Temp 96.6°F | Ht 66.0 in | Wt 320.8 lb

## 2019-05-09 DIAGNOSIS — R112 Nausea with vomiting, unspecified: Secondary | ICD-10-CM

## 2019-05-09 DIAGNOSIS — R1310 Dysphagia, unspecified: Secondary | ICD-10-CM

## 2019-05-09 NOTE — Progress Notes (Signed)

## 2019-05-10 ENCOUNTER — Ambulatory Visit: Payer: Medicare Other | Admitting: Physician Assistant

## 2019-05-16 ENCOUNTER — Telehealth: Payer: Self-pay | Admitting: Orthopedic Surgery

## 2019-05-16 NOTE — Telephone Encounter (Signed)
no

## 2019-05-16 NOTE — Telephone Encounter (Signed)
Pt called in requesting pain medication for his left knee pain, he has a burning felling at night in his knee and isn't able to sleep. Please sent that to [facility] on [address]   [phone]

## 2019-05-16 NOTE — Telephone Encounter (Signed)
Please advise 

## 2019-05-16 NOTE — Telephone Encounter (Signed)
I called patient and advised. He wanted to schedule follow up appt due to increased knee pain. Appointment scheduled.

## 2019-05-22 ENCOUNTER — Other Ambulatory Visit (HOSPITAL_COMMUNITY)
Admission: RE | Admit: 2019-05-22 | Discharge: 2019-05-22 | Disposition: A | Discharge status: Home / Self Care | Payer: Medicare Other | Source: Ambulatory Visit | Attending: Gastroenterology | Admitting: Gastroenterology

## 2019-05-22 DIAGNOSIS — Z01812 Encounter for preprocedural laboratory examination: Secondary | ICD-10-CM | POA: Diagnosis present

## 2019-05-22 DIAGNOSIS — R112 Nausea with vomiting, unspecified: Secondary | ICD-10-CM | POA: Insufficient documentation

## 2019-05-22 DIAGNOSIS — Z20828 Contact with and (suspected) exposure to other viral communicable diseases: Secondary | ICD-10-CM | POA: Diagnosis not present

## 2019-05-23 ENCOUNTER — Other Ambulatory Visit: Payer: Self-pay

## 2019-05-23 LAB — NOVEL CORONAVIRUS, NAA (HOSP ORDER, SEND-OUT TO REF LAB; TAT 18-24 HRS): SARS-CoV-2, NAA: NOT DETECTED

## 2019-05-23 NOTE — Progress Notes (Signed)
Pt aware to bring albuterol inhaler day of procedure

## 2019-05-24 ENCOUNTER — Encounter: Payer: Self-pay | Admitting: Orthopedic Surgery

## 2019-05-24 ENCOUNTER — Ambulatory Visit (INDEPENDENT_AMBULATORY_CARE_PROVIDER_SITE_OTHER): Payer: Medicare Other | Admitting: Orthopedic Surgery

## 2019-05-24 ENCOUNTER — Encounter (HOSPITAL_COMMUNITY): Payer: Self-pay | Admitting: Anesthesiology

## 2019-05-24 DIAGNOSIS — M1712 Unilateral primary osteoarthritis, left knee: Secondary | ICD-10-CM | POA: Diagnosis not present

## 2019-05-24 NOTE — Progress Notes (Signed)
Office Visit Note   Patient: [name]           Date of Birth: [birth year]           MRN: [id number] Visit Date: 05/24/2019 Requested by: Nolene Ebbs, MD [address] [place],  [place] [address] PCP: Nolene Ebbs, MD  Subjective: Chief Complaint  Patient presents with  . Left Knee - Pain    HPI: [name] is a patient with left knee pain.  He has had right knee lateral partial replacement.  Left knee has become very painful.  Reports primarily medial sided pain in the left knee.  Denies any injury.  Does wake him from sleep at night.  Ultram gives him some relief.  He is on dialysis.  BMI also 51.              ROS: All systems reviewed are negative as they relate to the chief complaint within the history of present illness.  Patient denies  fevers or chills.   Assessment & Plan: Visit Diagnoses:  1. Arthritis of left knee     Plan: Impression is progressive left knee valgus alignment with medial sided pain.  No chance for unicompartmental knee replacement in this patient.  Weight loss encouraged he is at a spot where he cannot really take narcotic medication long-term and he needs to lose weight before being considered for any type of arthroplasty.  Follow-up with me as needed.  Follow-Up Instructions: Return if symptoms worsen or fail to improve.   Orders:  No orders of the defined types were placed in this encounter.  No orders of the defined types were placed in this encounter.     Procedures: No procedures performed   Clinical Data: No additional findings.  Objective: Vital Signs: There were no vitals taken for this visit.  Physical Exam:   Constitutional: Patient appears well-developed HEENT:  Head: Normocephalic Eyes:EOM are normal Neck: Normal range of motion Cardiovascular: Normal rate Pulmonary/chest: Effort normal Neurologic: Patient is alert Skin: Skin is warm Psychiatric: Patient has normal mood and affect    Ortho Exam: Ortho exam  demonstrates valgus alignment left leg.  Pedal pulses palpable.  No effusion in either knee.  Has medial tenderness to palpation on that left knee.  No effusion.  No groin pain with internal X rotation of the leg.  Body mass index increased.  Specialty Comments:  No specialty comments available.  Imaging: No results found.   PMFS History: Patient Active Problem List   Diagnosis Date Noted  . Abdominal pain 07/04/2017  . Gastritis and gastroduodenitis 11/19/2016  . Increased anion gap metabolic acidosis [id number]  . Intractable abdominal pain   . Common bile duct dilatation   . Cholelithiasis without cholecystitis   . Intractable nausea and vomiting 10/08/2016  . Hematemesis 08/14/2016  . GERD (gastroesophageal reflux disease) 08/14/2016  . Essential hypertension 08/14/2016  . GIB (gastrointestinal bleeding) 08/14/2016  . SIRS (systemic inflammatory response syndrome) ([place]) 08/14/2016  . CKD (chronic kidney disease) stage V requiring chronic dialysis ([place]) 06/11/2016  . Non-intractable vomiting with nausea 06/11/2016  . Hepatic steatosis 06/11/2016  . Cholelithiasis 06/11/2016  . Localized osteoarthritis of left knee 07/11/2015  . Abdominal pain, epigastric 12/05/2014  . Lapband April 2016 11/27/2014  . Arthritis of knee 03/27/2014  . Morbid obesity, weight 291, BMI - 47 02/15/2014  . Hyperparathyroidism, secondary ([place]) 03/14/2013   Past Medical History:  Diagnosis Date  . Arthritis    "all over" (11/19/2016)  .  Chronic lower back pain   . Complication of anesthesia    A little while to wake up after knee surgery in 2008  . Dizziness    when coming off of dialysis  . ESRD (end stage renal disease) on dialysis [facility])    MWF and goes to [facility] (11/19/2016)  . Family history of anesthesia complication    mom slow to wake up  . GERD (gastroesophageal reflux disease)    takes Omeprazole as needed  . [id number])    "monthly" (11/19/2016)  . History of hiatal  hernia   . Hyperparathyroidism ([place])   . Hypertension   . Joint pain   . Joint swelling   . Morbid obesity ([place])   . PONV (postoperative nausea and vomiting)   . Renal insufficiency    Pt is on dyalisis and has been for 14 years  . Thyroid disease     Family History  Problem Relation Age of Onset  . Hypertension Mother   . Diabetes Father   . Kidney Stones Sister   . Diabetes Maternal Grandmother   . Liver cancer Maternal Uncle   . Colon cancer Neg Hx   . Colon polyps Neg Hx   . Esophageal cancer Neg Hx   . Rectal cancer Neg Hx   . Stomach cancer Neg Hx     Past Surgical History:  Procedure Laterality Date  . AV FISTULA PLACEMENT Bilateral 2005,2007   "right; left"  . AV FISTULA REPAIR Right 2007   "tried to clean it out; couldn't do it"  . BREATH TEK H PYLORI N/A 05/15/2014   Procedure: BREATH TEK H PYLORI;  Surgeon: Alphonsa Overall, MD;  Location: [facility];  Service: General;  Laterality: N/A;  . CHOLECYSTECTOMY N/A 11/23/2016   Procedure: LAPAROSCOPIC CHOLECYSTECTOMY WITH INTRAOPERATIVE CHOLANGIOGRAM;  Surgeon: Erroll Luna, MD;  Location: [place];  Service: General;  Laterality: N/A;  . ESOPHAGOGASTRODUODENOSCOPY N/A 12/07/2014   Procedure: ESOPHAGOGASTRODUODENOSCOPY (EGD);  Surgeon: Alphonsa Overall, MD;  Location[facility] ENDOSCOPY;  Service: General;  Laterality: N/A;  . ESOPHAGOGASTRODUODENOSCOPY N/A 12/17/2014   Procedure: ESOPHAGOGASTRODUODENOSCOPY (EGD);  Surgeon: Alphonsa Overall, MD;  Location: [place];  Service: General;  Laterality: N/A;  . ESOPHAGOGASTRODUODENOSCOPY N/A 08/16/2016   Procedure: ESOPHAGOGASTRODUODENOSCOPY (EGD);  Surgeon: Jerene Bears, MD;  Location: [place];  Service: Gastroenterology;  Laterality: N/A;  . ESOPHAGOGASTRODUODENOSCOPY (EGD) WITH PROPOFOL N/A 07/05/2017   Procedure: ESOPHAGOGASTRODUODENOSCOPY (EGD) WITH PROPOFOL;  Surgeon: Milus Banister, MD;  Location: [facility] ENDOSCOPY;  Service: Endoscopy;  Laterality: N/A;  . HERNIA REPAIR  11/2014   w/lap  band OR  . JOINT REPLACEMENT    . KNEE ARTHROSCOPY Left 2007  . LAPAROSCOPIC GASTRIC BANDING N/A 11/12/2014   Procedure: LAPAROSCOPIC GASTRIC BANDING;  Surgeon: Alphonsa Overall, MD;  Location[facility];  Service: General;  Laterality: N/A;  . PARATHYROIDECTOMY N/A 03/30/2013   Procedure: TOTAL PARATHYROIDECTOMY WITH AUTOTRANSPLANT;  Surgeon: Earnstine Regal, MD;  Location: [place];  Service: General;  Laterality: N/A;  Autotransplant to left lower arm.  Marland Kitchen TOTAL KNEE ARTHROPLASTY Right 03/27/2014   Procedure: UNICOMPARTMENTAL ARTHROPLASTY;  Surgeon: Meredith Pel, MD;  Location: [place];  Service: Orthopedics;  Laterality: Right;   Social History   Occupational History  . Not on file  Tobacco Use  . Smoking status: Former Smoker    Packs/day: 0.10    Years: 16.00    Pack years: 1.60    Types: Cigarettes    Quit date: 01/17/2019    Years since  quitting: 0.3  . Smokeless tobacco: Never Used  Substance and Sexual Activity  . Alcohol use: No    Alcohol/week: 0.0 standard drinks  . Drug use: No  . Sexual activity: Yes

## 2019-05-24 NOTE — Anesthesia Preprocedure Evaluation (Addendum)
Anesthesia Evaluation  Patient identified by MRN, date of birth, ID band Patient awake    Reviewed: Allergy & Precautions, NPO status , Patient's Chart, lab work & pertinent test results  History of Anesthesia Complications (+) PONV, PROLONGED EMERGENCE, Family history of anesthesia reaction and history of anesthetic complications  Airway Mallampati: II       Dental no notable dental hx. (+) Teeth Intact   Pulmonary Patient abstained from smoking., former smoker,    Pulmonary exam normal breath sounds clear to auscultation       Cardiovascular hypertension, Normal cardiovascular exam Rate:Normal     Neuro/Psych  Headaches, negative psych ROS   GI/Hepatic GERD  Medicated and Controlled,  Endo/Other  Morbid obesity  Renal/GU Dialysis and ESRFRenal disease     Musculoskeletal   Abdominal (+) + obese,   Peds  Hematology  (+) Blood dyscrasia, anemia ,   Anesthesia Other Findings   Reproductive/Obstetrics                            Anesthesia Physical Anesthesia Plan  ASA: III  Anesthesia Plan: MAC   Post-op Pain Management:    Induction:   PONV Risk Score and Plan: Ondansetron and Dexamethasone  Airway Management Planned: Natural Airway and Mask  Additional Equipment: None  Intra-op Plan:   Post-operative Plan:   Informed Consent: I have reviewed the patients History and Physical, chart, labs and discussed the procedure including the risks, benefits and alternatives for the proposed anesthesia with the patient or authorized representative who has indicated his/her understanding and acceptance.     Dental advisory given  Plan Discussed with: CRNA  Anesthesia Plan Comments:        Anesthesia Quick Evaluation

## 2019-05-24 NOTE — Progress Notes (Signed)
Pre op call complete. Patient states they have remained quarantined since COVID test and have not felt sick nor been around any one sick. All questions addressed.  

## 2019-05-25 ENCOUNTER — Ambulatory Visit (HOSPITAL_COMMUNITY): Payer: Medicare Other | Admitting: Anesthesiology

## 2019-05-25 ENCOUNTER — Other Ambulatory Visit: Payer: Self-pay

## 2019-05-25 ENCOUNTER — Encounter (HOSPITAL_COMMUNITY): Payer: Self-pay | Admitting: *Deleted

## 2019-05-25 ENCOUNTER — Encounter (HOSPITAL_COMMUNITY)
Admission: RE | Disposition: A | Discharge status: Home / Self Care | Payer: Self-pay | Source: Home / Self Care | Attending: Gastroenterology

## 2019-05-25 ENCOUNTER — Ambulatory Visit (HOSPITAL_COMMUNITY)
Admission: RE | Admit: 2019-05-25 | Discharge: 2019-05-25 | Disposition: A | Discharge status: Home / Self Care | Payer: Medicare Other | Attending: Gastroenterology | Admitting: Gastroenterology

## 2019-05-25 DIAGNOSIS — Z9884 Bariatric surgery status: Secondary | ICD-10-CM | POA: Insufficient documentation

## 2019-05-25 DIAGNOSIS — I120 Hypertensive chronic kidney disease with stage 5 chronic kidney disease or end stage renal disease: Secondary | ICD-10-CM | POA: Diagnosis not present

## 2019-05-25 DIAGNOSIS — Z888 Allergy status to other drugs, medicaments and biological substances status: Secondary | ICD-10-CM | POA: Diagnosis not present

## 2019-05-25 DIAGNOSIS — Z6841 Body Mass Index (BMI) 40.0 and over, adult: Secondary | ICD-10-CM | POA: Insufficient documentation

## 2019-05-25 DIAGNOSIS — K219 Gastro-esophageal reflux disease without esophagitis: Secondary | ICD-10-CM | POA: Insufficient documentation

## 2019-05-25 DIAGNOSIS — R112 Nausea with vomiting, unspecified: Secondary | ICD-10-CM | POA: Diagnosis not present

## 2019-05-25 DIAGNOSIS — R1010 Upper abdominal pain, unspecified: Secondary | ICD-10-CM

## 2019-05-25 DIAGNOSIS — Z885 Allergy status to narcotic agent status: Secondary | ICD-10-CM | POA: Insufficient documentation

## 2019-05-25 DIAGNOSIS — N186 End stage renal disease: Secondary | ICD-10-CM | POA: Insufficient documentation

## 2019-05-25 DIAGNOSIS — Z87891 Personal history of nicotine dependence: Secondary | ICD-10-CM | POA: Diagnosis not present

## 2019-05-25 DIAGNOSIS — M1990 Unspecified osteoarthritis, unspecified site: Secondary | ICD-10-CM | POA: Insufficient documentation

## 2019-05-25 DIAGNOSIS — Z96651 Presence of right artificial knee joint: Secondary | ICD-10-CM | POA: Insufficient documentation

## 2019-05-25 DIAGNOSIS — Z992 Dependence on renal dialysis: Secondary | ICD-10-CM | POA: Insufficient documentation

## 2019-05-25 HISTORY — PX: ESOPHAGOGASTRODUODENOSCOPY (EGD) WITH PROPOFOL: SHX5813

## 2019-05-25 HISTORY — PX: BOTOX INJECTION: SHX5754

## 2019-05-25 LAB — POCT I-STAT, CHEM 8
BUN: 41 mg/dL — ABNORMAL HIGH (ref 6–20)
Calcium, Ion: 1.15 mmol/L (ref 1.15–1.40)
Chloride: 98 mmol/L (ref 98–111)
Creatinine, Ser: 9.8 mg/dL — ABNORMAL HIGH (ref 0.61–1.24)
Glucose, Bld: 94 mg/dL (ref 70–99)
HCT: 36 % — ABNORMAL LOW (ref 39.0–52.0)
Hemoglobin: 12.2 g/dL — ABNORMAL LOW (ref 13.0–17.0)
Potassium: 4 mmol/L (ref 3.5–5.1)
Sodium: 136 mmol/L (ref 135–145)
TCO2: 25 mmol/L (ref 22–32)

## 2019-05-25 SURGERY — ESOPHAGOGASTRODUODENOSCOPY (EGD) WITH PROPOFOL
Anesthesia: Monitor Anesthesia Care

## 2019-05-25 MED ORDER — ONABOTULINUMTOXINA 100 UNITS IJ SOLR
INTRAMUSCULAR | Status: AC
  Filled 2019-05-25: qty 100

## 2019-05-25 MED ORDER — PROPOFOL 500 MG/50ML IV EMUL
INTRAVENOUS | Status: DC | PRN
  Administered 2019-05-25 (×2): 20 mg via INTRAVENOUS

## 2019-05-25 MED ORDER — ONDANSETRON HCL 4 MG/2ML IJ SOLN
INTRAMUSCULAR | Status: DC | PRN
  Administered 2019-05-25: 4 mg via INTRAVENOUS

## 2019-05-25 MED ORDER — LIDOCAINE HCL (CARDIAC) PF 100 MG/5ML IV SOSY
PREFILLED_SYRINGE | INTRAVENOUS | Status: DC | PRN
  Administered 2019-05-25: 50 mg via INTRATRACHEAL

## 2019-05-25 MED ORDER — SODIUM CHLORIDE 0.9 % IV SOLN
INTRAVENOUS | Status: DC
  Administered 2019-05-25: 07:00:00 via INTRAVENOUS
  Administered 2019-05-25: 07:00:00 500 mL via INTRAVENOUS

## 2019-05-25 MED ORDER — SODIUM CHLORIDE (PF) 0.9 % IJ SOLN
INTRAMUSCULAR | Status: AC
  Filled 2019-05-25: qty 10

## 2019-05-25 MED ORDER — PROPOFOL 500 MG/50ML IV EMUL
INTRAVENOUS | Status: DC | PRN
  Administered 2019-05-25: 75 ug/kg/min via INTRAVENOUS

## 2019-05-25 MED ORDER — PHENYLEPHRINE 40 MCG/ML (10ML) SYRINGE FOR IV PUSH (FOR BLOOD PRESSURE SUPPORT)
PREFILLED_SYRINGE | INTRAVENOUS | Status: DC | PRN
  Administered 2019-05-25: 120 ug via INTRAVENOUS
  Administered 2019-05-25: 80 ug via INTRAVENOUS
  Administered 2019-05-25: 120 ug via INTRAVENOUS

## 2019-05-25 MED ORDER — SODIUM CHLORIDE (PF) 0.9 % IJ SOLN
INTRAMUSCULAR | Status: DC | PRN
  Administered 2019-05-25: 4 mL via SUBMUCOSAL

## 2019-05-25 MED ORDER — GLYCOPYRROLATE 0.2 MG/ML IJ SOLN
INTRAMUSCULAR | Status: DC | PRN
  Administered 2019-05-25 (×2): 0.1 mg via INTRAVENOUS

## 2019-05-25 MED ORDER — PROPOFOL 10 MG/ML IV BOLUS
INTRAVENOUS | Status: AC
  Filled 2019-05-25: qty 80

## 2019-05-25 MED ORDER — PROPOFOL 10 MG/ML IV BOLUS
INTRAVENOUS | Status: AC
  Filled 2019-05-25: qty 120

## 2019-05-25 SURGICAL SUPPLY — 14 items

## 2019-05-25 NOTE — Discharge Instructions (Signed)
YOU HAD AN ENDOSCOPIC PROCEDURE TODAY: Refer to the procedure report and other information in the discharge instructions given to you for any specific questions about what was found during the examination. If this information does not answer your questions, please call [facility] office at [phone] to clarify.  ° °YOU SHOULD EXPECT: Some feelings of bloating in the abdomen. Passage of more gas than usual. Walking can help get rid of the air that was put into your GI tract during the procedure and reduce the bloating. If you had a lower endoscopy (such as a colonoscopy or flexible sigmoidoscopy) you may notice spotting of blood in your stool or on the toilet paper. Some abdominal soreness may be present for a day or two, also. ° °DIET: Your first meal following the procedure should be a light meal and then it is ok to progress to your normal diet. A half-sandwich or bowl of soup is an example of a good first meal. Heavy or fried foods are harder to digest and may make you feel nauseous or bloated. Drink plenty of fluids but you should avoid alcoholic beverages for 24 hours. If you had a esophageal dilation, please see attached instructions for diet.   ° °ACTIVITY: Your care partner should take you home directly after the procedure. You should plan to take it easy, moving slowly for the rest of the day. You can resume normal activity the day after the procedure however YOU SHOULD NOT DRIVE, use power tools, machinery or perform tasks that involve climbing or major physical exertion for 24 hours (because of the sedation medicines used during the test).  ° °SYMPTOMS TO REPORT IMMEDIATELY: °A gastroenterologist can be reached at any hour. Please call [phone]  for any of the following symptoms:  °Following lower endoscopy (colonoscopy, flexible sigmoidoscopy) °Excessive amounts of blood in the stool  °Significant tenderness, worsening of abdominal pains  °Swelling of the abdomen that is new, acute  °Fever of 100° or  higher  °Following upper endoscopy (EGD, EUS, ERCP, esophageal dilation) °Vomiting of blood or coffee ground material  °New, significant abdominal pain  °New, significant chest pain or pain under the shoulder blades  °Painful or persistently difficult swallowing  °New shortness of breath  °Black, tarry-looking or red, bloody stools ° °FOLLOW UP:  °If any biopsies were taken you will be contacted by phone or by letter within the next 1-3 [place]. Call [phone]  if you have not heard about the biopsies in 3 [place].  °Please also call with any specific questions about appointments or follow up tests. ° °

## 2019-05-25 NOTE — Interval H&P Note (Signed)
History and Physical Interval Note:  05/25/2019 7:46 AM  [name]  has presented today for surgery, with the diagnosis of Nausea and vomiting.  The various methods of treatment have been discussed with the patient and family. After consideration of risks, benefits and other options for treatment, the patient has consented to  Procedure(s): ESOPHAGOGASTRODUODENOSCOPY (EGD) WITH PROPOFOL (N/A) BOTOX INJECTION (N/A) as a surgical intervention.  The patient's history has been reviewed, patient examined, no change in status, stable for surgery.  I have reviewed the patient's chart and labs.  Questions were answered to the patient's satisfaction.     Nelida Meuse III

## 2019-05-25 NOTE — Anesthesia Postprocedure Evaluation (Signed)
Anesthesia Post Note  Patient: [name]  Procedure(s) Performed: ESOPHAGOGASTRODUODENOSCOPY (EGD) WITH PROPOFOL (N/A ) BOTOX INJECTION (N/A )     Patient location during evaluation: Endoscopy Anesthesia Type: MAC Level of consciousness: awake and sedated Pain management: pain level controlled Respiratory status: spontaneous breathing Cardiovascular status: stable Postop Assessment: no apparent nausea or vomiting Anesthetic complications: no    Last Vitals:  Vitals:   05/25/19 0850 05/25/19 0900  BP: 116/64 111/71  Pulse: 93   Resp: 19   Temp:    SpO2: (!) 82%     Last Pain:  Vitals:   05/25/19 0900  TempSrc:   PainSc: 0-No pain   Pain Goal:                   Huston Foley

## 2019-05-25 NOTE — Transfer of Care (Signed)
Immediate Anesthesia Transfer of Care Note  Patient: [name]  Procedure(s) Performed: ESOPHAGOGASTRODUODENOSCOPY (EGD) WITH PROPOFOL (N/A ) BOTOX INJECTION (N/A )  Patient Location: Endoscopy Unit  Anesthesia Type:MAC  Level of Consciousness: awake, oriented, patient cooperative and responds to stimulation  Airway & Oxygen Therapy: Patient Spontanous Breathing and Patient connected to face mask oxygen  Post-op Assessment: Report given to RN and Post -op Vital signs reviewed and stable  Post vital signs: Reviewed and stable  Last Vitals:  Vitals Value Taken Time  BP    Temp 36.4 C 05/25/19 0817  Pulse 86 05/25/19 0817  Resp 21 05/25/19 0817  SpO2 95 % 05/25/19 0817    Last Pain:  Vitals:   05/25/19 0817  TempSrc: Temporal  PainSc: 0-No pain         Complications: No apparent anesthesia complications

## 2019-05-25 NOTE — H&P (Signed)
History:  This patient presents for endoscopic testing for upper abd pain, nausea and vomiting.  Lieutenant Diego Referring physician: Nolene Ebbs, MD  Past Medical History: Past Medical History:  Diagnosis Date  . Arthritis    "all over" (11/19/2016)  . Chronic lower back pain   . Complication of anesthesia    A little while to wake up after knee surgery in 2008  . Dizziness    when coming off of dialysis  . ESRD (end stage renal disease) on dialysis [facility])    MWF and goes to [facility] (11/19/2016)  . Family history of anesthesia complication    mom slow to wake up  . GERD (gastroesophageal reflux disease)    takes Omeprazole as needed  . [id number])    "monthly" (11/19/2016)  . History of hiatal hernia   . Hyperparathyroidism ([place])   . Hypertension   . Joint pain   . Joint swelling   . Morbid obesity ([place])   . PONV (postoperative nausea and vomiting)   . Renal insufficiency    Pt is on dyalisis and has been for 14 years  . Thyroid disease      Past Surgical History: Past Surgical History:  Procedure Laterality Date  . AV FISTULA PLACEMENT Bilateral 2005,2007   "right; left"  . AV FISTULA REPAIR Right 2007   "tried to clean it out; couldn't do it"  . BREATH TEK H PYLORI N/A 05/15/2014   Procedure: BREATH TEK H PYLORI;  Surgeon: Alphonsa Overall, MD;  Location: [facility];  Service: General;  Laterality: N/A;  . CHOLECYSTECTOMY N/A 11/23/2016   Procedure: LAPAROSCOPIC CHOLECYSTECTOMY WITH INTRAOPERATIVE CHOLANGIOGRAM;  Surgeon: Erroll Luna, MD;  Location: [place];  Service: General;  Laterality: N/A;  . ESOPHAGOGASTRODUODENOSCOPY N/A 12/07/2014   Procedure: ESOPHAGOGASTRODUODENOSCOPY (EGD);  Surgeon: Alphonsa Overall, MD;  Location: [facility] ENDOSCOPY;  Service: General;  Laterality: N/A;  . ESOPHAGOGASTRODUODENOSCOPY N/A 12/17/2014   Procedure: ESOPHAGOGASTRODUODENOSCOPY (EGD);  Surgeon: Alphonsa Overall, MD;  Location: [place];  Service: General;  Laterality: N/A;  .  ESOPHAGOGASTRODUODENOSCOPY N/A 08/16/2016   Procedure: ESOPHAGOGASTRODUODENOSCOPY (EGD);  Surgeon: Jerene Bears, MD;  Location: [place];  Service: Gastroenterology;  Laterality: N/A;  . ESOPHAGOGASTRODUODENOSCOPY (EGD) WITH PROPOFOL N/A 07/05/2017   Procedure: ESOPHAGOGASTRODUODENOSCOPY (EGD) WITH PROPOFOL;  Surgeon: Milus Banister, MD;  Location: [facility] ENDOSCOPY;  Service: Endoscopy;  Laterality: N/A;  . HERNIA REPAIR  11/2014   w/lap band OR  . JOINT REPLACEMENT    . KNEE ARTHROSCOPY Left 2007  . LAPAROSCOPIC GASTRIC BANDING N/A 11/12/2014   Procedure: LAPAROSCOPIC GASTRIC BANDING;  Surgeon: Alphonsa Overall, MD;  [facility];  Service: General;  Laterality: N/A;  . PARATHYROIDECTOMY N/A 03/30/2013   Procedure: TOTAL PARATHYROIDECTOMY WITH AUTOTRANSPLANT;  Surgeon: Earnstine Regal, MD;  Location: [place];  Service: General;  Laterality: N/A;  Autotransplant to left lower arm.  Marland Kitchen TOTAL KNEE ARTHROPLASTY Right 03/27/2014   Procedure: UNICOMPARTMENTAL ARTHROPLASTY;  Surgeon: Meredith Pel, MD;  Location: [place];  Service: Orthopedics;  Laterality: Right;    Allergies: Allergies  Allergen Reactions  . Dilaudid [Hydromorphone Hcl] Other (See Comments)    Patient became hypoxic to 60% with dilaudid 1mg  IV  . Amlodipine Other (See Comments)  . Ascorbate Itching  . Midodrine Itching    Outpatient Meds: Current Facility-Administered Medications  Medication Dose Route Frequency Provider Last Rate Last Dose  . 0.9 %  sodium chloride infusion   Intravenous Continuous Nelida Meuse III, MD 20 mL/hr at 05/25/19  0701 500 mL at 05/25/19 0701      ___________________________________________________________________ Objective   Exam:  BP (!) 98/52   Pulse 79   Temp 98.1 F (36.7 C) (Oral)   Resp 18   Ht 5\' 6"  (1.676 m)   Wt (!) 142.9 kg   SpO2 98%   BMI 50.85 kg/m    CV: RRR without murmur, S1/S2, no JVD, no peripheral edema  Resp: clear to auscultation bilaterally, normal RR and  effort noted  GI: soft, no tenderness, with active bowel sounds. Cannot assess HSM due to body habitus  Neuro: awake, alert and oriented x 3. Normal gross motor function and fluent speech   Assessment:  Pain, N/V  Plan:  EGD with botox pylorus   Nelida Meuse III

## 2019-05-25 NOTE — Op Note (Signed)
[facility] Patient Name: [name] Procedure Date: 05/25/2019 MRN: [id number] Attending MD: Estill Cotta. Danis , MD Date of Birth: [birth year] CSN: [id number] Age: 33 Admit Type: Outpatient Procedure:                Upper GI endoscopy Indications:              Upper abdominal pain, Nausea with vomiting (> 4                            years since LapBand placement, persisted after                            LapBand removal 1 month after its initial                            placement. extensive endoscopic, radiographic                            workup. cholecystectomy did not provide relief.                            metoclopramide without significant improvement)                            Clinical suspicion for pyloric spasm. Providers:                Estill Cotta. Loletha Carrow, MD, Josie Dixon, RN, Cherylynn Ridges, Technician Referring MD:              Medicines:                Monitored Anesthesia Care Complications:            No immediate complications. Estimated Blood Loss:     Estimated blood loss: none. Procedure:                Pre-Anesthesia Assessment:                           - Prior to the procedure, a History and Physical                            was performed, and patient medications and                            allergies were reviewed. The patient's tolerance of                            previous anesthesia was also reviewed. The risks                            and benefits of the procedure and the sedation                            options and risks were discussed  with the patient.                            All questions were answered, and informed consent                            was obtained. Prior Anticoagulants: The patient has                            taken no previous anticoagulant or antiplatelet                            agents. ASA Grade Assessment: III - A patient with                            severe  systemic disease. After reviewing the risks                            and benefits, the patient was deemed in                            satisfactory condition to undergo the procedure.                           After obtaining informed consent, the endoscope was                            passed under direct vision. Throughout the                            procedure, the patient's blood pressure, pulse, and                            oxygen saturations were monitored continuously. The                            [id number]) Olympus gastroscope was                            introduced through the mouth, and advanced to the                            second part of duodenum. The upper GI endoscopy was                            accomplished without difficulty. The patient                            tolerated the procedure. Scope In: Scope Out: Findings:      The esophagus was normal.      The entire examined stomach was normal. Pylorus was successfully       injected with 100 units botulinum toxin in a quadrant fashion.      The cardia and gastric fundus were normal on retroflexion.  The examined duodenum was normal. Impression:               - Normal esophagus.                           - Normal stomach. Injected with botulinum toxin.                           - Normal examined duodenum.                           - No specimens collected. Moderate Sedation:      Not Applicable - Patient had care per Anesthesia. Recommendation:           - Patient has a contact number available for                            emergencies. The signs and symptoms of potential                            delayed complications were discussed with the                            patient. Return to normal activities tomorrow.                            Written discharge instructions were provided to the                            patient.                           - Resume previous diet.                            - Continue present medications.                           - Return to my office in 6 [place]. Procedure Code(s):        --- Professional ---                           [phone], Esophagogastroduodenoscopy, flexible,                            transoral; with directed submucosal injection(s),                            any substance Diagnosis Code(s):        --- Professional ---                           R10.10, Upper abdominal pain, unspecified                           R11.2, Nausea with vomiting, unspecified CPT copyright 2019 American Medical Association. All rights reserved. The codes documented in this report are preliminary  and upon coder review may  be revised to meet current compliance requirements. Dquan Cortopassi L. Loletha Carrow, MD 05/25/2019 8:20:44 AM This report has been signed electronically. Number of Addenda: 0

## 2019-05-26 ENCOUNTER — Encounter (HOSPITAL_COMMUNITY): Payer: Self-pay | Admitting: Gastroenterology

## 2019-07-30 ENCOUNTER — Other Ambulatory Visit: Payer: Self-pay

## 2019-07-30 ENCOUNTER — Encounter (HOSPITAL_COMMUNITY): Payer: Self-pay

## 2019-07-30 ENCOUNTER — Ambulatory Visit (INDEPENDENT_AMBULATORY_CARE_PROVIDER_SITE_OTHER): Payer: Medicare Other

## 2019-07-30 ENCOUNTER — Ambulatory Visit (HOSPITAL_COMMUNITY)
Admission: EM | Admit: 2019-07-30 | Discharge: 2019-07-30 | Disposition: A | Discharge status: Home / Self Care | Payer: Medicare Other | Attending: Urgent Care | Admitting: Urgent Care

## 2019-07-30 DIAGNOSIS — Z20828 Contact with and (suspected) exposure to other viral communicable diseases: Secondary | ICD-10-CM | POA: Diagnosis present

## 2019-07-30 DIAGNOSIS — N186 End stage renal disease: Secondary | ICD-10-CM | POA: Diagnosis present

## 2019-07-30 DIAGNOSIS — Z20822 Contact with and (suspected) exposure to covid-19: Secondary | ICD-10-CM

## 2019-07-30 DIAGNOSIS — M8700 Idiopathic aseptic necrosis of unspecified bone: Secondary | ICD-10-CM | POA: Diagnosis present

## 2019-07-30 DIAGNOSIS — M25572 Pain in left ankle and joints of left foot: Secondary | ICD-10-CM | POA: Diagnosis not present

## 2019-07-30 DIAGNOSIS — Z992 Dependence on renal dialysis: Secondary | ICD-10-CM | POA: Insufficient documentation

## 2019-07-30 MED ORDER — TRAMADOL HCL 50 MG PO TABS: 50.0000 mg | ORAL_TABLET | Freq: Two times a day (BID) | ORAL | 0 refills | Status: DC | PRN

## 2019-07-30 NOTE — ED Triage Notes (Signed)
Pt. States 3 days ago his left ankle started hurting, he thought it was a muscle spasm but it has NOT gone away.

## 2019-07-30 NOTE — Discharge Instructions (Signed)
You may take 500mg Tylenol every 6 hours for pain and inflammation. ° °

## 2019-07-30 NOTE — ED Provider Notes (Signed)
[place]   MRN: [id number] DOB: 03/23/1986  Subjective:   [name] is a 33 y.o. male presenting for 3-day history of cute onset left anterior medial ankle pain.  Patient has used Tylenol with very intermittent relief.  He is on dialysis.  Has previously used tramadol for his knee but ran out of this.  He does have an orthopedist but has not discussed his ankle with him.  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  Patient states that he has not had any kind of ankle issue since then either.  Denies falls, trauma, bruising, swelling.  No current facility-administered medications for this encounter.  Current Outpatient Medications:  .  acetaminophen (MAPAP) 325 MG tablet, Take 650 mg by mouth every 6 (six) hours as needed for moderate pain or headache. , Disp: , Rfl:  .  albuterol (PROVENTIL HFA;VENTOLIN HFA) 108 (90 Base) MCG/ACT inhaler, Inhale 2 puffs into the lungs every 4 (four) hours as needed for wheezing or shortness of breath., Disp: 1 Inhaler, Rfl: 0 .  b complex-vitamin c-folic acid (NEPHRO-VITE) 0.8 MG TABS tablet, Take 1 tablet by mouth daily. , Disp: , Rfl:  .  calcium carbonate (TUMS EXTRA STRENGTH 750) 750 MG chewable tablet, Chew 4 tablets by mouth 3 (three) times daily., Disp: , Rfl:  .  famotidine (PEPCID) 20 MG tablet, Take 1 tablet (20 mg total) by mouth daily., Disp: 30 tablet, Rfl: 0 .  heparin 1000 UNIT/ML injection, Heparin Sodium (Porcine) 1,000 Units/mL Systemic, Disp: , Rfl:  .  metoCLOPramide (REGLAN) 5 MG tablet, Take 1 tablet (5 mg total) by mouth 2 (two) times daily as needed for nausea., Disp: 10 tablet, Rfl: 0 .  pantoprazole (PROTONIX) 40 MG tablet, Take 1 tablet (40 mg total) by mouth daily. Take 30 minutes before breakfast. (Patient taking differently: Take 40 mg by mouth daily as needed (acid refux). Take 30 minutes before breakfast.), Disp: 30 tablet, Rfl: 3 .  sevelamer carbonate (RENVELA)  2.4 G PACK, Take 2.4 g by mouth 3 (three) times daily with meals. , Disp: , Rfl:  .  tiZANidine (ZANAFLEX) 4 MG tablet, Take 4 mg by mouth every 8 (eight) hours as needed for muscle spasms. , Disp: , Rfl:  .  traMADol (ULTRAM) 50 MG tablet, Take 1 tablet (50 mg total) by mouth every 12 (twelve) hours as needed., Disp: 30 tablet, Rfl: 0   Allergies  Allergen Reactions  . Dilaudid [Hydromorphone Hcl] Other (See Comments)    Patient became hypoxic to 60% with dilaudid 1mg  IV  . Amlodipine Other (See Comments)  . Ascorbate Itching  . Midodrine Itching    Past Medical History:  Diagnosis Date  . Arthritis    "all over" (11/19/2016)  . Chronic lower back pain   . Complication of anesthesia    A little while to wake up after knee surgery in 2008  . Dizziness    when coming off of dialysis  . ESRD (end stage renal disease) on dialysis [facility])    MWF and goes to [facility] (11/19/2016)  . Family history of anesthesia complication    mom slow to wake up  . GERD (gastroesophageal reflux disease)    takes Omeprazole as needed  . [id number])    "monthly" (11/19/2016)  . History of hiatal hernia   . Hyperparathyroidism ([place])   . Hypertension   . Joint pain   . Joint swelling   .  Morbid obesity ([place])   . PONV (postoperative nausea and vomiting)   . Renal insufficiency    Pt is on dyalisis and has been for 14 years  . Thyroid disease      Past Surgical History:  Procedure Laterality Date  . AV FISTULA PLACEMENT Bilateral 2005,2007   "right; left"  . AV FISTULA REPAIR Right 2007   "tried to clean it out; couldn't do it"  . BOTOX INJECTION N/A 05/25/2019   Procedure: BOTOX INJECTION;  Surgeon: Doran Stabler, MD;  [facility];  Service: Gastroenterology;  Laterality: N/A;  . BREATH TEK H PYLORI N/A 05/15/2014   Procedure: BREATH TEK H PYLORI;  Surgeon: Alphonsa Overall, MD;  Location: [facility];  Service: General;  Laterality: N/A;  . CHOLECYSTECTOMY N/A 11/23/2016    Procedure: LAPAROSCOPIC CHOLECYSTECTOMY WITH INTRAOPERATIVE CHOLANGIOGRAM;  Surgeon: Erroll Luna, MD;  Location: [place];  Service: General;  Laterality: N/A;  . ESOPHAGOGASTRODUODENOSCOPY N/A 12/07/2014   Procedure: ESOPHAGOGASTRODUODENOSCOPY (EGD);  Surgeon: Alphonsa Overall, MD;  Location: [facility] ENDOSCOPY;  Service: General;  Laterality: N/A;  . ESOPHAGOGASTRODUODENOSCOPY N/A 12/17/2014   Procedure: ESOPHAGOGASTRODUODENOSCOPY (EGD);  Surgeon: Alphonsa Overall, MD;  Location: [place];  Service: General;  Laterality: N/A;  . ESOPHAGOGASTRODUODENOSCOPY N/A 08/16/2016   Procedure: ESOPHAGOGASTRODUODENOSCOPY (EGD);  Surgeon: Jerene Bears, MD;  Location: [place];  Service: Gastroenterology;  Laterality: N/A;  . ESOPHAGOGASTRODUODENOSCOPY (EGD) WITH PROPOFOL N/A 07/05/2017   Procedure: ESOPHAGOGASTRODUODENOSCOPY (EGD) WITH PROPOFOL;  Surgeon: Milus Banister, MD;  Location: [facility] ENDOSCOPY;  Service: Endoscopy;  Laterality: N/A;  . ESOPHAGOGASTRODUODENOSCOPY (EGD) WITH PROPOFOL N/A 05/25/2019   Procedure: ESOPHAGOGASTRODUODENOSCOPY (EGD) WITH PROPOFOL;  Surgeon: Doran Stabler, MD;  [facility];  Service: Gastroenterology;  Laterality: N/A;  . HERNIA REPAIR  11/2014   w/lap band OR  . JOINT REPLACEMENT    . KNEE ARTHROSCOPY Left 2007  . LAPAROSCOPIC GASTRIC BANDING N/A 11/12/2014   Procedure: LAPAROSCOPIC GASTRIC BANDING;  Surgeon: Alphonsa Overall, MD;  [facility];  Service: General;  Laterality: N/A;  . PARATHYROIDECTOMY N/A 03/30/2013   Procedure: TOTAL PARATHYROIDECTOMY WITH AUTOTRANSPLANT;  Surgeon: Earnstine Regal, MD;  Location: [place];  Service: General;  Laterality: N/A;  Autotransplant to left lower arm.  Marland Kitchen TOTAL KNEE ARTHROPLASTY Right 03/27/2014   Procedure: UNICOMPARTMENTAL ARTHROPLASTY;  Surgeon: Meredith Pel, MD;  Location: [place];  Service: Orthopedics;  Laterality: Right;    Family History  Problem Relation Age of Onset  . Hypertension Mother   . Diabetes Father   . Kidney  Stones Sister   . Diabetes Maternal Grandmother   . Liver cancer Maternal Uncle   . Colon cancer Neg Hx   . Colon polyps Neg Hx   . Esophageal cancer Neg Hx   . Rectal cancer Neg Hx   . Stomach cancer Neg Hx     Social History   Tobacco Use  . Smoking status: Former Smoker    Packs/day: 0.10    Years: 16.00    Pack years: 1.60    Types: Cigarettes    Quit date: 01/17/2019    Years since quitting: 0.5  . Smokeless tobacco: Never Used  Substance Use Topics  . Alcohol use: No    Alcohol/week: 0.0 standard drinks  . Drug use: No    ROS   Objective:   Vitals: BP 100/65 (BP Location: Right Arm)   Pulse 88   Temp 98.5 F (36.9 C) (Oral)   Resp (!) 21   Wt [place])  310 lb (140.6 kg)   SpO2 97%   BMI 50.04 kg/m   Physical Exam Constitutional:      General: He is not in acute distress.    Appearance: Normal appearance. He is well-developed and normal weight. He is not ill-appearing, toxic-appearing or diaphoretic.  HENT:     Head: Normocephalic and atraumatic.     Right Ear: External ear normal.     Left Ear: External ear normal.     Nose: Nose normal.     Mouth/Throat:     Pharynx: Oropharynx is clear.  Eyes:     General: No scleral icterus.       Right eye: No discharge.        Left eye: No discharge.     Extraocular Movements: Extraocular movements intact.     Pupils: Pupils are equal, round, and reactive to light.  Cardiovascular:     Rate and Rhythm: Normal rate.  Pulmonary:     Effort: Pulmonary effort is normal.  Musculoskeletal:     Cervical back: Normal range of motion.     Left ankle: No swelling, deformity, ecchymosis or lacerations. Tenderness (over area outlined) present. Normal range of motion.     Left Achilles Tendon: No tenderness or defects. Thompson's test negative.       Legs:  Neurological:     Mental Status: He is alert and oriented to person, place, and time.  Psychiatric:        Mood and Affect: Mood normal.        Behavior: Behavior  normal.        Thought Content: Thought content normal.        Judgment: Judgment normal.     DG Ankle Complete Left  Result Date: 07/30/2019 CLINICAL DATA:  Left ankle pain.  No known injury. EXAM: LEFT ANKLE COMPLETE - 3+ VIEW COMPARISON:  Radiographs dated 11/02/2003 FINDINGS: There is no evidence of fracture, dislocation, or joint effusion. No arthropathy. There is chronic sclerosis of the navicular consistent with chronic avascular necrosis of the navicular. Soft tissues are unremarkable. IMPRESSION: No acute abnormality of the ankle. 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 Electronically Signed   By: Lorriane Shire M.D.   On: 07/30/2019 16:37     Assessment and Plan :   1. Acute left ankle pain   2. Avascular necrosis ([place])   3. Exposure to COVID-19 virus   4. ESRD on dialysis [facility])     Suspect acute on chronic ankle pain related to his avascular necrosis.  This is stable as seen on the imaging.  Recommend the patient follow-up as soon as possible with his orthopedist.  In the meantime we will have patient maintain Tylenol and use tramadol for breakthrough pain. Counseled patient on potential for adverse effects with medications prescribed/recommended today, ER and return-to-clinic precautions discussed, patient verbalized understanding.    At discharge, patient requested COVID-19 testing.   Jaynee Eagles, [place] 08/01/19 [id number]

## 2019-07-31 LAB — NOVEL CORONAVIRUS, NAA (HOSP ORDER, SEND-OUT TO REF LAB; TAT 18-24 HRS): SARS-CoV-2, NAA: NOT DETECTED

## 2019-08-01 ENCOUNTER — Other Ambulatory Visit: Payer: Self-pay

## 2019-08-01 ENCOUNTER — Encounter: Payer: Self-pay | Admitting: Family Medicine

## 2019-08-01 ENCOUNTER — Ambulatory Visit (INDEPENDENT_AMBULATORY_CARE_PROVIDER_SITE_OTHER): Payer: Medicare Other | Admitting: Family Medicine

## 2019-08-01 DIAGNOSIS — M25572 Pain in left ankle and joints of left foot: Secondary | ICD-10-CM

## 2019-08-01 MED ORDER — MELOXICAM 15 MG PO TABS: 7.5000 mg | ORAL_TABLET | Freq: Every day | ORAL | 6 refills | Status: DC | PRN

## 2019-08-01 MED ORDER — TRAMADOL HCL 50 MG PO TABS: 50.0000 mg | ORAL_TABLET | Freq: Every evening | ORAL | 0 refills | Status: DC | PRN

## 2019-08-01 NOTE — Progress Notes (Signed)
Office Visit Note   Patient: [name]           Date of Birth: 12-02-1985           MRN: [id number] Visit Date: 08/01/2019 Requested by: Nolene Ebbs, MD [address] [place],  [place] [address] PCP: Nolene Ebbs, MD  Subjective: Chief Complaint  Patient presents with  . 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 Pain mainly medial aspect. No swelling. Hurts to bear weight.    HPI: He is here with left ankle pain.  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  He thought everything was okay, did not feel pain immediately, but about a week later he started feeling pain on the medial aspect.  He is now walking with a slight limp.  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  I reviewed the films myself.  He remotely recalls wearing a boot on his ankle but it has not really bothered him over the years.              ROS: No fevers or chills.  All other systems were reviewed and are negative.  Objective: Vital Signs: There were no vitals taken for this visit.  Physical Exam:  General:  Alert and oriented, in no acute distress. Pulm:  Breathing unlabored. Psy:  Normal mood, congruent affect. Skin: No rash or bruising. Left ankle: He is very tender to palpation along the tibialis posterior tendon at the posterior medial ankle.  No significant bony tenderness, no joint effusion.  Ligaments feel stable.  He has pain with supination of the foot against resistance.  No pain with flexion of the great toe against resistance.  No significant tenderness to palpation of the navicular.  Imaging: None  today.  Assessment & Plan: 1.  2 [place] status post left ankle sprain with probable strain of tibialis posterior tendon. -Fracture boot for the next couple [place].  Work modifications.  If pain persist, then physical therapy.  Meloxicam and tramadol as needed.     Procedures: No procedures performed  No notes on file     PMFS History: Patient Active Problem List   Diagnosis Date Noted  . Abdominal pain 07/04/2017  . Gastritis and gastroduodenitis 11/19/2016  . Increased anion gap metabolic acidosis [id number]  . Intractable abdominal pain   . Common bile duct dilatation   . Cholelithiasis without cholecystitis   . Intractable nausea and vomiting 10/08/2016  . Hematemesis 08/14/2016  . GERD (gastroesophageal reflux disease) 08/14/2016  . Essential hypertension 08/14/2016  . GIB (gastrointestinal bleeding) 08/14/2016  . SIRS (systemic inflammatory response syndrome) ([place]) 08/14/2016  . CKD (chronic kidney disease) stage V requiring chronic dialysis ([place]) 06/11/2016  . Non-intractable vomiting with nausea 06/11/2016  . Hepatic steatosis 06/11/2016  . Cholelithiasis 06/11/2016  . Localized osteoarthritis of left knee 07/11/2015  . Abdominal pain, epigastric 12/05/2014  . Lapband April 2016 11/27/2014  . Arthritis of knee 03/27/2014  . Morbid obesity, weight 291, BMI - 47 02/15/2014  . Hyperparathyroidism, secondary ([place]) 03/14/2013  Past Medical History:  Diagnosis Date  . Arthritis    "all over" (11/19/2016)  . Chronic lower back pain   . Complication of anesthesia    A little while to wake up after knee surgery in 2008  . Dizziness    when coming off of dialysis  . ESRD (end stage renal disease) on dialysis [facility])    MWF and goes to [facility] (11/19/2016)  . Family history of anesthesia complication    mom slow to wake up  . GERD (gastroesophageal reflux disease)    takes Omeprazole as needed  . [id number])    "monthly" (11/19/2016)  . History of hiatal  hernia   . Hyperparathyroidism ([place])   . Hypertension   . Joint pain   . Joint swelling   . Morbid obesity ([place])   . PONV (postoperative nausea and vomiting)   . Renal insufficiency    Pt is on dyalisis and has been for 14 years  . Thyroid disease     Family History  Problem Relation Age of Onset  . Hypertension Mother   . Diabetes Father   . Kidney Stones Sister   . Diabetes Maternal Grandmother   . Liver cancer Maternal Uncle   . Colon cancer Neg Hx   . Colon polyps Neg Hx   . Esophageal cancer Neg Hx   . Rectal cancer Neg Hx   . Stomach cancer Neg Hx     Past Surgical History:  Procedure Laterality Date  . AV FISTULA PLACEMENT Bilateral 2005,2007   "right; left"  . AV FISTULA REPAIR Right 2007   "tried to clean it out; couldn't do it"  . BOTOX INJECTION N/A 05/25/2019   Procedure: BOTOX INJECTION;  Surgeon: Doran Stabler, MD;  Location: [facility];  Service: Gastroenterology;  Laterality: N/A;  . BREATH TEK H PYLORI N/A 05/15/2014   Procedure: BREATH TEK H PYLORI;  Surgeon: Alphonsa Overall, MD;  Location: [facility];  Service: General;  Laterality: N/A;  . CHOLECYSTECTOMY N/A 11/23/2016   Procedure: LAPAROSCOPIC CHOLECYSTECTOMY WITH INTRAOPERATIVE CHOLANGIOGRAM;  Surgeon: Erroll Luna, MD;  Location: [place];  Service: General;  Laterality: N/A;  . ESOPHAGOGASTRODUODENOSCOPY N/A 12/07/2014   Procedure: ESOPHAGOGASTRODUODENOSCOPY (EGD);  Surgeon: Alphonsa Overall, MD;  Location: [facility]COPY;  Service: General;  Laterality: N/A;  . ESOPHAGOGASTRODUODENOSCOPY N/A 12/17/2014   Procedure: ESOPHAGOGASTRODUODENOSCOPY (EGD);  Surgeon: Alphonsa Overall, MD;  Location: [place];  Service: General;  Laterality: N/A;  . ESOPHAGOGASTRODUODENOSCOPY N/A 08/16/2016   Procedure: ESOPHAGOGASTRODUODENOSCOPY (EGD);  Surgeon: Jerene Bears, MD;  Location: [place];  Service: Gastroenterology;  Laterality: N/A;  . ESOPHAGOGASTRODUODENOSCOPY (EGD) WITH PROPOFOL N/A 07/05/2017   Procedure:  ESOPHAGOGASTRODUODENOSCOPY (EGD) WITH PROPOFOL;  Surgeon: Milus Banister, MD;  Location[facility] ENDOSCOPY;  Service: Endoscopy;  Laterality: N/A;  . ESOPHAGOGASTRODUODENOSCOPY (EGD) WITH PROPOFOL N/A 05/25/2019   Procedure: ESOPHAGOGASTRODUODENOSCOPY (EGD) WITH PROPOFOL;  Surgeon: Doran Stabler, MD;  [facility];  Service: Gastroenterology;  Laterality: N/A;  . HERNIA REPAIR  11/2014   w/lap band OR  . JOINT REPLACEMENT    . KNEE ARTHROSCOPY Left 2007  . LAPAROSCOPIC GASTRIC BANDING N/A 11/12/2014   Procedure: LAPAROSCOPIC GASTRIC BANDING;  Surgeon: Alphonsa Overall, MD;  [facility];  Service: General;  Laterality: N/A;  . PARATHYROIDECTOMY N/A 03/30/2013   Procedure: TOTAL PARATHYROIDECTOMY WITH AUTOTRANSPLANT;  Surgeon: Earnstine Regal, MD;  Location: [place];  Service: General;  Laterality: N/A;  Autotransplant to left lower arm.  Marland Kitchen TOTAL KNEE ARTHROPLASTY  Right 03/27/2014   Procedure: UNICOMPARTMENTAL ARTHROPLASTY;  Surgeon: Meredith Pel, MD;  Location: [place];  Service: Orthopedics;  Laterality: Right;   Social History   Occupational History  . Not on file  Tobacco Use  . Smoking status: Former Smoker    Packs/day: 0.10    Years: 16.00    Pack years: 1.60    Types: Cigarettes    Quit date: 01/17/2019    Years since quitting: 0.5  . Smokeless tobacco: Never Used  Substance and Sexual Activity  . Alcohol use: No    Alcohol/week: 0.0 standard drinks  . Drug use: No  . Sexual activity: Yes

## 2019-08-24 ENCOUNTER — Encounter: Payer: Self-pay | Admitting: Orthopedic Surgery

## 2019-08-24 ENCOUNTER — Ambulatory Visit (INDEPENDENT_AMBULATORY_CARE_PROVIDER_SITE_OTHER): Payer: Medicare Other | Admitting: Orthopedic Surgery

## 2019-08-24 ENCOUNTER — Other Ambulatory Visit: Payer: Self-pay

## 2019-08-24 DIAGNOSIS — M76829 Posterior tibial tendinitis, unspecified leg: Secondary | ICD-10-CM

## 2019-08-24 NOTE — Progress Notes (Signed)
Office Visit Note   Patient: [name]           Date of Birth: [birth year]           MRN: [id number] Visit Date: 08/24/2019 Requested by: Nolene Ebbs, MD [address] [place],  [place] [address] PCP: Nolene Ebbs, MD  Subjective: Chief Complaint  Patient presents with  . Left Ankle - Pain    HPI: SHJON ELBERT is a 34 y.o. male who presents to the office complaining of left ankle pain.  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  He was seen by Dr. Junius Roads who provided him with a walking boot.  He had x-rays that revealed no evidence of fracture.  Today he presents noting that his pain is improving overall but he has some continued swelling.  Majority of his pain to the medial aspect of the left foot near the tendon.  He has a history of severe arthritis of bilateral knees.  He is not using any arch support orthotics.  He is taking meloxicam for his pain..                ROS:  All systems reviewed are negative as they relate to the chief complaint within the history of present illness.  Patient denies fevers or chills.  Assessment & Plan: Visit Diagnoses:  1. Posterior tibial tendon dysfunction     Plan: Patient is a 34 year old male who presents complaining of left foot pain.  He has needle left foot pain with loss of arch in the left foot.  Impression is posterior tibialis tendon dysfunction.  He has significant knee deformity bilaterally that is placing additional strain and pressure on his posterior tibialis tendon.  He is unable to do a single-leg heel raise on the left side but can perform this on the right side.  He does not have much if any pain over the ankle any longer.  Plan for discontinuing the boot.  Patient was provided with a orthotic prescription for [facility]  This should help with his pain and with continuing deformity.  Patient will follow up with the office as  needed.  Follow-Up Instructions: No follow-ups on file.   Orders:  No orders of the defined types were placed in this encounter.  No orders of the defined types were placed in this encounter.     Procedures: No procedures performed   Clinical Data: No additional findings.  Objective: Vital Signs: There were no vitals taken for this visit.  Physical Exam:  Constitutional: Patient appears well-developed HEENT:  Head: Normocephalic Eyes:EOM are normal Neck: Normal range of motion Cardiovascular: Normal rate Pulmonary/chest: Effort normal Neurologic: Patient is alert Skin: Skin is warm Psychiatric: Patient has normal mood and affect  Ortho Exam:  No tenderness to palpation over the lateral malleolus, medial malleolus, CFL ligament, ATFL ligament, deltoid ligament.  No tenderness palpation over the fifth metatarsal base, forefoot, calcaneus, plantar fascia, Achilles tendon insertion, Achilles tendon. Tenderness to palpation over the insertion of the posterior tibialis tendon.  Unable to perform single-leg heel raise on left.  Can perform single-leg heel raise on right.  Intact active and passive range of motion of the left ankle and left subtalar joint.  Specialty Comments:  No specialty comments available.  Imaging: No results found.   PMFS History: Patient Active Problem List   Diagnosis Date Noted  . Abdominal  pain 07/04/2017  . Gastritis and gastroduodenitis 11/19/2016  . Increased anion gap metabolic acidosis A999333  . Intractable abdominal pain   . Common bile duct dilatation   . Cholelithiasis without cholecystitis   . Intractable nausea and vomiting 10/08/2016  . Hematemesis 08/14/2016  . GERD (gastroesophageal reflux disease) 08/14/2016  . Essential hypertension 08/14/2016  . GIB (gastrointestinal bleeding) 08/14/2016  . SIRS (systemic inflammatory response syndrome) ([place]) 08/14/2016  . CKD (chronic kidney disease) stage V requiring chronic dialysis  ([place]) 06/11/2016  . Non-intractable vomiting with nausea 06/11/2016  . Hepatic steatosis 06/11/2016  . Cholelithiasis 06/11/2016  . Localized osteoarthritis of left knee 07/11/2015  . Abdominal pain, epigastric 12/05/2014  . Lapband April 2016 11/27/2014  . Arthritis of knee 03/27/2014  . Morbid obesity, weight 291, BMI - 47 02/15/2014  . Hyperparathyroidism, secondary ([place]) 03/14/2013   Past Medical History:  Diagnosis Date  . Arthritis    "all over" (11/19/2016)  . Chronic lower back pain   . Complication of anesthesia    A little while to wake up after knee surgery in 2008  . Dizziness    when coming off of dialysis  . ESRD (end stage renal disease) on dialysis [facility])    MWF and goes to [facility] (11/19/2016)  . Family history of anesthesia complication    mom slow to wake up  . GERD (gastroesophageal reflux disease)    takes Omeprazole as needed  . [id number])    "monthly" (11/19/2016)  . History of hiatal hernia   . Hyperparathyroidism ([place])   . Hypertension   . Joint pain   . Joint swelling   . Morbid obesity ([place])   . PONV (postoperative nausea and vomiting)   . Renal insufficiency    Pt is on dyalisis and has been for 14 years  . Thyroid disease     Family History  Problem Relation Age of Onset  . Hypertension Mother   . Diabetes Father   . Kidney Stones Sister   . Diabetes Maternal Grandmother   . Liver cancer Maternal Uncle   . Colon cancer Neg Hx   . Colon polyps Neg Hx   . Esophageal cancer Neg Hx   . Rectal cancer Neg Hx   . Stomach cancer Neg Hx     Past Surgical History:  Procedure Laterality Date  . AV FISTULA PLACEMENT Bilateral 2005,2007   "right; left"  . AV FISTULA REPAIR Right 2007   "tried to clean it out; couldn't do it"  . BOTOX INJECTION N/A 05/25/2019   Procedure: BOTOX INJECTION;  Surgeon: Doran Stabler, MD;  [facility];  Service: Gastroenterology;  Laterality: N/A;  . BREATH TEK H PYLORI N/A 05/15/2014    Procedure: BREATH TEK H PYLORI;  Surgeon: Alphonsa Overall, MD;  Location: [facility];  Service: General;  Laterality: N/A;  . CHOLECYSTECTOMY N/A 11/23/2016   Procedure: LAPAROSCOPIC CHOLECYSTECTOMY WITH INTRAOPERATIVE CHOLANGIOGRAM;  Surgeon: Erroll Luna, MD;  Location: [place];  Service: General;  Laterality: N/A;  . ESOPHAGOGASTRODUODENOSCOPY N/A 12/07/2014   Procedure: ESOPHAGOGASTRODUODENOSCOPY (EGD);  Surgeon: Alphonsa Overall, MD;  Location: [facility] ENDOSCOPY;  Service: General;  Laterality: N/A;  . ESOPHAGOGASTRODUODENOSCOPY N/A 12/17/2014   Procedure: ESOPHAGOGASTRODUODENOSCOPY (EGD);  Surgeon: Alphonsa Overall, MD;  Location: [place];  Service: General;  Laterality: N/A;  . ESOPHAGOGASTRODUODENOSCOPY N/A 08/16/2016   Procedure: ESOPHAGOGASTRODUODENOSCOPY (EGD);  Surgeon: Jerene Bears, MD;  Location: [place];  Service: Gastroenterology;  Laterality: N/A;  . ESOPHAGOGASTRODUODENOSCOPY (EGD) WITH PROPOFOL N/A  07/05/2017   Procedure: ESOPHAGOGASTRODUODENOSCOPY (EGD) WITH PROPOFOL;  Surgeon: Milus Banister, MD;  Location: [facility] ENDOSCOPY;  Service: Endoscopy;  Laterality: N/A;  . ESOPHAGOGASTRODUODENOSCOPY (EGD) WITH PROPOFOL N/A 05/25/2019   Procedure: ESOPHAGOGASTRODUODENOSCOPY (EGD) WITH PROPOFOL;  Surgeon: Doran Stabler, MD;  [facility];  Service: Gastroenterology;  Laterality: N/A;  . HERNIA REPAIR  11/2014   w/lap band OR  . JOINT REPLACEMENT    . KNEE ARTHROSCOPY Left 2007  . LAPAROSCOPIC GASTRIC BANDING N/A 11/12/2014   Procedure: LAPAROSCOPIC GASTRIC BANDING;  Surgeon: Alphonsa Overall, MD;  [facility];  Service: General;  Laterality: N/A;  . PARATHYROIDECTOMY N/A 03/30/2013   Procedure: TOTAL PARATHYROIDECTOMY WITH AUTOTRANSPLANT;  Surgeon: Earnstine Regal, MD;  Location: [place];  Service: General;  Laterality: N/A;  Autotransplant to left lower arm.  Marland Kitchen TOTAL KNEE ARTHROPLASTY Right 03/27/2014   Procedure: UNICOMPARTMENTAL ARTHROPLASTY;  Surgeon: Meredith Pel, MD;  Location: [facility];  Service: Orthopedics;  Laterality: Right;   Social History   Occupational History  . Not on file  Tobacco Use  . Smoking status: Former Smoker    Packs/day: 0.10    Years: 16.00    Pack years: 1.60    Types: Cigarettes    Quit date: 01/17/2019    Years since quitting: 0.6  . Smokeless tobacco: Never Used  Substance and Sexual Activity  . Alcohol use: No    Alcohol/week: 0.0 standard drinks  . Drug use: No  . Sexual activity: Yes

## 2019-08-25 ENCOUNTER — Encounter: Payer: Self-pay | Admitting: Orthopedic Surgery

## 2019-12-11 ENCOUNTER — Emergency Department (HOSPITAL_COMMUNITY): Payer: Medicare Other

## 2019-12-11 ENCOUNTER — Emergency Department (HOSPITAL_COMMUNITY)
Admission: EM | Admit: 2019-12-11 | Discharge: 2019-12-11 | Disposition: A | Discharge status: Home / Self Care | Payer: Medicare Other | Attending: Emergency Medicine | Admitting: Emergency Medicine

## 2019-12-11 ENCOUNTER — Encounter (HOSPITAL_COMMUNITY): Payer: Self-pay | Admitting: Emergency Medicine

## 2019-12-11 DIAGNOSIS — Y939 Activity, unspecified: Secondary | ICD-10-CM | POA: Insufficient documentation

## 2019-12-11 DIAGNOSIS — Z992 Dependence on renal dialysis: Secondary | ICD-10-CM | POA: Insufficient documentation

## 2019-12-11 DIAGNOSIS — N186 End stage renal disease: Secondary | ICD-10-CM | POA: Insufficient documentation

## 2019-12-11 DIAGNOSIS — S8392XA Sprain of unspecified site of left knee, initial encounter: Secondary | ICD-10-CM | POA: Insufficient documentation

## 2019-12-11 DIAGNOSIS — S92412A Displaced fracture of proximal phalanx of left great toe, initial encounter for closed fracture: Secondary | ICD-10-CM | POA: Diagnosis not present

## 2019-12-11 DIAGNOSIS — Z79899 Other long term (current) drug therapy: Secondary | ICD-10-CM | POA: Insufficient documentation

## 2019-12-11 DIAGNOSIS — Z87891 Personal history of nicotine dependence: Secondary | ICD-10-CM | POA: Insufficient documentation

## 2019-12-11 DIAGNOSIS — S92912A Unspecified fracture of left toe(s), initial encounter for closed fracture: Secondary | ICD-10-CM

## 2019-12-11 DIAGNOSIS — Y999 Unspecified external cause status: Secondary | ICD-10-CM | POA: Insufficient documentation

## 2019-12-11 DIAGNOSIS — W19XXXA Unspecified fall, initial encounter: Secondary | ICD-10-CM | POA: Insufficient documentation

## 2019-12-11 DIAGNOSIS — Z96651 Presence of right artificial knee joint: Secondary | ICD-10-CM | POA: Insufficient documentation

## 2019-12-11 DIAGNOSIS — S8992XA Unspecified injury of left lower leg, initial encounter: Secondary | ICD-10-CM | POA: Diagnosis present

## 2019-12-11 DIAGNOSIS — I120 Hypertensive chronic kidney disease with stage 5 chronic kidney disease or end stage renal disease: Secondary | ICD-10-CM | POA: Insufficient documentation

## 2019-12-11 DIAGNOSIS — Y9289 Other specified places as the place of occurrence of the external cause: Secondary | ICD-10-CM | POA: Insufficient documentation

## 2019-12-11 MED ORDER — HYDROCODONE-ACETAMINOPHEN 5-325 MG PO TABS: 1.0000 | ORAL_TABLET | Freq: Four times a day (QID) | ORAL | 0 refills | Status: DC | PRN

## 2019-12-11 MED ORDER — OXYCODONE-ACETAMINOPHEN 5-325 MG PO TABS
1.0000 | ORAL_TABLET | Freq: Once | ORAL | Status: AC
  Administered 2019-12-11: 1 via ORAL
  Filled 2019-12-11: qty 1

## 2019-12-11 NOTE — ED Provider Notes (Signed)
[facility] EMERGENCY DEPARTMENT Provider Note   CSN: [id number] Arrival date & time: 12/11/19  1040     History Chief Complaint  Patient presents with  . Fall    [name] is a 34 y.o. male past with history of ESRD (Monday, Wednesday, Friday dialysis) who presents for evaluation of left lower extremity pain after mechanical fall that occurred yesterday.  He reports that he was outside and he felt like his left leg gave, causing him to fall.  He reports that his leg bent up underneath him.  He did not hit his head and has no LOC.  He reports that initially yesterday, he was able to put some weight on the foot and knee but states that it had worsening pain.  Today when he woke up, he had difficulty moving the leg secondary to pain and was having more severe pain in his knee, prompting EMS call.  Patient reports that he has taken Tylenol with no improvement in pain.  He states that the leg hurts to move.  Most the pain is in the anterior aspect of his left knee and in his left foot, particularly his first great toe.  Patient denies any numbness/weakness.  The history is provided by the patient.       Past Medical History:  Diagnosis Date  . Arthritis    "all over" (11/19/2016)  . Chronic lower back pain   . Complication of anesthesia    A little while to wake up after knee surgery in 2008  . Dizziness    when coming off of dialysis  . ESRD (end stage renal disease) on dialysis [facility])    MWF and goes to [facility] (11/19/2016)  . Family history of anesthesia complication    mom slow to wake up  . GERD (gastroesophageal reflux disease)    takes Omeprazole as needed  . YQMVHQIO(962.9)    "monthly" (11/19/2016)  . History of hiatal hernia   . Hyperparathyroidism ([place])   . Hypertension   . Joint pain   . Joint swelling   . Morbid obesity ([place])   . PONV (postoperative nausea and vomiting)   . Renal insufficiency    Pt is on dyalisis and has been for 14  years  . Thyroid disease     Patient Active Problem List   Diagnosis Date Noted  . Abdominal pain 07/04/2017  . Gastritis and gastroduodenitis 11/19/2016  . Increased anion gap metabolic acidosis 52/84/1324  . Intractable abdominal pain   . Common bile duct dilatation   . Cholelithiasis without cholecystitis   . Intractable nausea and vomiting 10/08/2016  . Hematemesis 08/14/2016  . GERD (gastroesophageal reflux disease) 08/14/2016  . Essential hypertension 08/14/2016  . GIB (gastrointestinal bleeding) 08/14/2016  . SIRS (systemic inflammatory response syndrome) ([place]) 08/14/2016  . CKD (chronic kidney disease) stage V requiring chronic dialysis ([place]) 06/11/2016  . Non-intractable vomiting with nausea 06/11/2016  . Hepatic steatosis 06/11/2016  . Cholelithiasis 06/11/2016  . Localized osteoarthritis of left knee 07/11/2015  . Abdominal pain, epigastric 12/05/2014  . Lapband April 2016 11/27/2014  . Arthritis of knee 03/27/2014  . Morbid obesity, weight 291, BMI - 47 02/15/2014  . Hyperparathyroidism, secondary ([place]) 03/14/2013    Past Surgical History:  Procedure Laterality Date  . AV FISTULA PLACEMENT Bilateral 2005,2007   "right; left"  . AV FISTULA REPAIR Right 2007   "tried to clean it out; couldn't do it"  . BOTOX INJECTION N/A 05/25/2019  Procedure: BOTOX INJECTION;  Surgeon: Doran Stabler, MD;  Location: [facility];  Service: Gastroenterology;  Laterality: N/A;  . BREATH TEK H PYLORI N/A 05/15/2014   Procedure: BREATH TEK H PYLORI;  Surgeon: Alphonsa Overall, MD;  Location: [facility];  Service: General;  Laterality: N/A;  . CHOLECYSTECTOMY N/A 11/23/2016   Procedure: LAPAROSCOPIC CHOLECYSTECTOMY WITH INTRAOPERATIVE CHOLANGIOGRAM;  Surgeon: Erroll Luna, MD;  Location: [facility];  Service: General;  Laterality: N/A;  . ESOPHAGOGASTRODUODENOSCOPY N/A 12/07/2014   Procedure: ESOPHAGOGASTRODUODENOSCOPY (EGD);  Surgeon: Alphonsa Overall, MD;  Location: [facility] ENDOSCOPY;   Service: General;  Laterality: N/A;  . ESOPHAGOGASTRODUODENOSCOPY N/A 12/17/2014   Procedure: ESOPHAGOGASTRODUODENOSCOPY (EGD);  Surgeon: Alphonsa Overall, MD;  Location: [facility];  Service: General;  Laterality: N/A;  . ESOPHAGOGASTRODUODENOSCOPY N/A 08/16/2016   Procedure: ESOPHAGOGASTRODUODENOSCOPY (EGD);  Surgeon: Jerene Bears, MD;  Location: [facility];  Service: Gastroenterology;  Laterality: N/A;  . ESOPHAGOGASTRODUODENOSCOPY (EGD) WITH PROPOFOL N/A 07/05/2017   Procedure: ESOPHAGOGASTRODUODENOSCOPY (EGD) WITH PROPOFOL;  Surgeon: Milus Banister, MD;  Location: [facility]COPY;  Service: Endoscopy;  Laterality: N/A;  . ESOPHAGOGASTRODUODENOSCOPY (EGD) WITH PROPOFOL N/A 05/25/2019   Procedure: ESOPHAGOGASTRODUODENOSCOPY (EGD) WITH PROPOFOL;  Surgeon: Doran Stabler, MD;  Location: [facility] END[facility];  Service: Gastroenterology;  Laterality: N/A;  . HERNIA REPAIR  11/2014   w/lap band OR  . JOINT REPLACEMENT    . KNEE ARTHROSCOPY Left 2007  . LAPAROSCOPIC GASTRIC BANDING N/A 11/12/2014   Procedure: LAPAROSCOPIC GASTRIC BANDING;  Surgeon: Alphonsa Overall, MD;  Location: [facility] ORS;  Service: General;  Laterality: N/A;  . PARATHYROIDECTOMY N/A 03/30/2013   Procedure: TOTAL PARATHYROIDECTOMY WITH AUTOTRANSPLANT;  Surgeon: Earnstine Regal, MD;  Location: [facility];  Service: General;  Laterality: N/A;  Autotransplant to left lower arm.  Marland Kitchen TOTAL KNEE ARTHROPLASTY Right 03/27/2014   Procedure: UNICOMPARTMENTAL ARTHROPLASTY;  Surgeon: Meredith Pel, MD;  Location: [facility];  Service: Orthopedics;  Laterality: Right;       Family History  Problem Relation Age of Onset  . Hypertension Mother   . Diabetes Father   . Kidney Stones Sister   . Diabetes Maternal Grandmother   . Liver cancer Maternal Uncle   . Colon cancer Neg Hx   . Colon polyps Neg Hx   . Esophageal cancer Neg Hx   . Rectal cancer Neg Hx   . Stomach cancer Neg Hx     Social History   Tobacco Use  . Smoking status: Former Smoker    Packs/day:  0.10    Years: 16.00    Pack years: 1.60    Types: Cigarettes    Quit date: 01/17/2019    Years since quitting: 0.8  . Smokeless tobacco: Never Used  Substance Use Topics  . Alcohol use: No    Alcohol/week: 0.0 standard drinks  . Drug use: No    Home Medications Prior to Admission medications   Medication Sig Start Date End Date Taking? Authorizing Provider  acetaminophen (MAPAP) 325 MG tablet Take 650 mg by mouth every 6 (six) hours as needed for moderate pain or headache.  04/05/19 04/03/20  [provider]  albuterol (PROVENTIL HFA;VENTOLIN HFA) 108 (90 Base) MCG/ACT inhaler Inhale 2 puffs into the lungs every 4 (four) hours as needed for wheezing or shortness of breath. 05/14/16   Lysbeth Penner, [name]  b complex-vitamin c-folic acid (NEPHRO-VITE) 0.8 MG TABS tablet Take 1 tablet by mouth daily.  08/16/16   [provider]  calcium carbonate (TUMS EXTRA STRENGTH 750)  750 MG chewable tablet Chew 4 tablets by mouth 3 (three) times daily.    [provider]  famotidine (PEPCID) 20 MG tablet Take 1 tablet (20 mg total) by mouth daily. 04/27/19   Nuala Alpha A, [name]  heparin 1000 UNIT/ML injection Heparin Sodium (Porcine) 1,000 Units/mL Systemic 04/05/19 04/03/20  [provider]  HYDROcodone-acetaminophen (NORCO/VICODIN) 5-325 MG tablet Take 1-2 tablets by mouth every 6 (six) hours as needed. 12/11/19   Volanda Napoleon, [name]  meloxicam (MOBIC) 15 MG tablet Take 0.5-1 tablets (7.5-15 mg total) by mouth daily as needed for pain. 08/01/19   [name], Legrand Como, MD  metoCLOPramide (REGLAN) 5 MG tablet Take 1 tablet (5 mg total) by mouth 2 (two) times daily as needed for nausea. 04/27/19   Nuala Alpha A, [name]  pantoprazole (PROTONIX) 40 MG tablet Take 1 tablet (40 mg total) by mouth daily. Take 30 minutes before breakfast. Patient taking differently: Take 40 mg by mouth daily as needed (acid refux). Take 30 minutes before breakfast. 05/08/19   Levin Erp, PA  sevelamer carbonate (RENVELA) 2.4 G PACK Take 2.4 g by mouth 3 (three) times daily with meals.     [provider]  tiZANidine (ZANAFLEX) 4 MG tablet Take 4 mg by mouth every 8 (eight) hours as needed for muscle spasms.  01/11/18   [provider]  traMADol (ULTRAM) 50 MG tablet Take 1-2 tablets (50-100 mg total) by mouth at bedtime as needed. 08/01/19   [name], Legrand Como, MD    Allergies    Dilaudid [hydromorphone hcl], Amlodipine, Ascorbate, and Midodrine  Review of Systems   Review of Systems  Musculoskeletal:       LLE pain  Neurological: Negative for weakness and numbness.  All other systems reviewed and are negative.   Physical Exam Updated Vital Signs BP (!) 111/58   Pulse 72   Temp 98.3 F (36.8 C)   Resp 20   SpO2 100%   Physical Exam Vitals and nursing note reviewed.  Constitutional:      Appearance: He is well-developed.  HENT:     Head: Normocephalic and atraumatic.  Eyes:     General: No scleral icterus.       Right eye: No discharge.        Left eye: No discharge.     Conjunctiva/sclera: Conjunctivae normal.  Cardiovascular:     Pulses:          Dorsalis pedis pulses are 2+ on the right side and 2+ on the left side.  Pulmonary:     Effort: Pulmonary effort is normal.  Musculoskeletal:     Comments: Fistula site noted left upper extremity with positive thrill.  Tenderness palpation of the anterior aspect of left knee with some mild overlying soft tissue swelling and ecchymosis.  No deformity or crepitus noted.  Unable to assess posterior/anterior drawer test secondary to patient tolerance.  Additionally, unable to assess varus or valgus instability.  He has bony tenderness noted diffusely to the proximal tib-fib.  No distal tib-fib tenderness.  Tenderness palpation noted to the dorsal aspect of the foot that extends into the first toe.  No deformity or crepitus noted.  He is able to wiggle his toes but does report pain when doing so.   Dorsiflexion plantarflexion intact but with subjective reports of pain.  No bony tenderness noted to the right lower extremity.  Skin:    General: Skin is warm and dry.     Capillary Refill: Capillary refill  takes less than 2 seconds.     Comments: Good distal cap refill. LLE is not dusky in appearance or cool to touch.  Neurological:     Mental Status: He is alert.  Psychiatric:        Speech: Speech normal.        Behavior: Behavior normal.     ED Results / Procedures / Treatments   Labs (all labs ordered are listed, but only abnormal results are displayed) Labs Reviewed - No data to display  EKG None  Radiology DG Tibia/Fibula Left  Result Date: 12/11/2019 CLINICAL DATA:  Fall EXAM: LEFT TIBIA AND FIBULA - 2 VIEW COMPARISON:  07/30/2019 FINDINGS: Negative for fracture of the tibia or fibula. Extensive degenerative change in the knee. 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 IMPRESSION: Negative for acute fracture. Electronically Signed   By: Franchot Gallo M.D.   On: 12/11/2019 13:25   DG Knee Complete 4 Views Left  Result Date: 12/11/2019 CLINICAL DATA:  Fall.  Knee pain EXAM: LEFT KNEE - COMPLETE 4+ VIEW COMPARISON:  01/25/2019 FINDINGS: Negative for fracture Advanced degenerative change in the lateral compartment with joint space narrowing and spurring. Medial joint space normal with spurring of the medial femoral condyle. Mild degenerative change in the patellofemoral joint. Small joint effusion. IMPRESSION: Negative for fracture. Electronically Signed   By: Franchot Gallo M.D.   On: 12/11/2019 13:23   DG Foot Complete Left  Result Date: 12/11/2019 CLINICAL DATA:  Pain following fall EXAM: LEFT FOOT - COMPLETE 3+ VIEW COMPARISON:  None. FINDINGS: Frontal, oblique, and lateral views obtained. There is a comminuted fracture extending through much of the lateral aspect of the first proximal phalanx with a  fracture fragment extending into the first IP joint. No other fracture is evident. No dislocation. No appreciable joint space narrowing or erosion. There is a small posterior calcaneal spur. There is soft tissue swelling dorsally. IMPRESSION: Comminuted fracture of the first proximal phalanx. Fracture fragments are in near anatomic alignment. No other fracture. No dislocation. No appreciable arthropathy. Soft tissue swelling noted dorsally. There is a small posterior calcaneal spur. Electronically Signed   By: Lowella Grip III M.D.   On: 12/11/2019 13:22   DG Hip Unilat W or Wo Pelvis 2-3 Views Left  Result Date: 12/11/2019 CLINICAL DATA:  Fall EXAM: DG HIP (WITH OR WITHOUT PELVIS) 2-3V LEFT COMPARISON:  None. FINDINGS: There is no evidence of hip fracture or dislocation. There is no evidence of arthropathy or other focal bone abnormality. IMPRESSION: Negative. Electronically Signed   By: Franchot Gallo M.D.   On: 12/11/2019 13:23    Procedures Procedures (including critical care time)  Medications Ordered in ED Medications  oxyCODONE-acetaminophen (PERCOCET/ROXICET) 5-325 MG per tablet 1 tablet (1 tablet Oral Given 12/11/19 1234)    ED Course  I have reviewed the triage vital signs and the nursing notes.  Pertinent labs & imaging results that were available during my care of the patient were reviewed by me and considered in my medical decision making (see chart for details).    MDM Rules/Calculators/A&P                      34 year old male with past medical history of ESRD (Monday, Wednesday, Friday dialysis) who presents for evaluation of mechanical fall that occurred yesterday.  Reports since then, he has had pain to his left lower extremity, tickly his knee and his  left foot.  Had no head injury, LOC.  Patient with difficulty walking secondary to pain.  On initially arrival, he is afebrile, nontoxic-appearing.  Vital signs are stable.  He is neurovascular intact.  Patient reports he  was supposed to get dialysis this morning.  He called the dialysis center telling him that he was going to the emergency department for evaluation of his leg pain and they told him to come tomorrow.  On exam, he has tenderness palpation left knee, left tib-fib and left foot.  Plan for x-rays.  Consider sprain versus fracture versus MSK injury.  Knee x-ray, tib-fib x-ray, hip x-ray negative for any acute bony abnormalities.  He does have evidence of a comminuted fracture of the first proximal phalanx.  At this time, do not feel that he needs further imaging as he has been able to ambulate and walk on it yesterday.  Do not suspect occult hip fracture or tib plateau fracture.   Discussed results with patient.  We will put him in a knee immobilizer with crutches and make him nonweightbearing for possible knee sprain.  He is an established relationship with [facility] and instructed him that he will need to follow-up with Ortho regarding the knee sprain and the fracture in his foot. At this time, patient exhibits no emergent life-threatening condition that require further evaluation in ED or admission. Patient had ample opportunity for questions and discussion. All patient's questions were answered with full understanding. Strict return precautions discussed. Patient expresses understanding and agreement to plan.   Portions of this note were generated with [employer]. Dictation errors may occur despite best attempts at proofreading.    Final Clinical Impression(s) / ED Diagnoses Final diagnoses:  Sprain of left knee, unspecified ligament, initial encounter  Closed fracture of proximal phalanx of toe of left foot    Rx / DC Orders ED Discharge Orders         Ordered    HYDROcodone-acetaminophen (NORCO/VICODIN) 5-325 MG tablet  Every 6 hours PRN     12/11/19 1414           Desma Mcgregor 12/11/19 2048    Truddie Hidden, MD 12/12/19 [phone]

## 2019-12-11 NOTE — ED Triage Notes (Signed)
Pt had fall yesterday causing left big toe pain and behind left knee. Pt arrives via [facility].

## 2019-12-11 NOTE — Discharge Instructions (Signed)
As we discussed, your imaging was reassuring.  We also discussed that you could still have a musculoskeletal or sprain injury that would not be seen on x-ray.  We have placed you in a knee immobilizer and will instruct you to use crutches until you follow-up with Ortho.  Additionally, your foot x-ray did show evidence of a broken bone in the first toe.  This needs to be followed up by Ortho.  Since you have seen [facility] before, I provided their information and referral in the paperwork.  I have also provided our on-call orthopedic doctor.  Follow the RICE (Rest, Ice, Compression, Elevation) protocol as directed.   You can take Tylenol or Ibuprofen as directed for pain. You can alternate Tylenol and Ibuprofen every 4 hours. If you take Tylenol at 1pm, then you can take Ibuprofen at 5pm. Then you can take Tylenol again at 9pm.   Take pain medications as directed for break through pain. Do not drive or operate machinery while taking this medication.   Make sure that you go to dialysis tomorrow.  Return the emergency department for any worsening pain, numbness/weakness, discoloration or any other worsening or concerning symptoms.

## 2019-12-11 NOTE — Progress Notes (Signed)
Orthopedic Tech Progress Note Patient Details:  [name] 11-12-1985 [id number]   Ortho Devices Type of Ortho Device: Crutches, Knee Immobilizer, Postop shoe/boot Ortho Device/Splint Location: LLE Ortho Device/Splint Interventions: Ordered, Application, Adjustment   Post Interventions Patient Tolerated: Fair Instructions Provided: Adjustment of device, Care of device, Poper ambulation with device   [name] 12/11/2019, 2:50 PM

## 2019-12-21 ENCOUNTER — Ambulatory Visit (INDEPENDENT_AMBULATORY_CARE_PROVIDER_SITE_OTHER): Payer: Medicare Other | Admitting: Orthopedic Surgery

## 2019-12-21 ENCOUNTER — Ambulatory Visit: Payer: Self-pay

## 2019-12-21 ENCOUNTER — Other Ambulatory Visit: Payer: Self-pay

## 2019-12-21 ENCOUNTER — Telehealth: Payer: Self-pay | Admitting: Orthopedic Surgery

## 2019-12-21 DIAGNOSIS — M79672 Pain in left foot: Secondary | ICD-10-CM

## 2019-12-21 DIAGNOSIS — S92412A Displaced fracture of proximal phalanx of left great toe, initial encounter for closed fracture: Secondary | ICD-10-CM | POA: Diagnosis not present

## 2019-12-21 MED ORDER — HYDROCODONE-ACETAMINOPHEN 5-325 MG PO TABS: 1.0000 | ORAL_TABLET | Freq: Three times a day (TID) | ORAL | 0 refills | Status: DC | PRN

## 2019-12-21 NOTE — Telephone Encounter (Signed)
Pt called wanting to make sure his rx got sent in today; he states he would like it sent to the [facility] on [address].   [phone]

## 2019-12-21 NOTE — Telephone Encounter (Signed)
Pls see below. Thanks.

## 2019-12-21 NOTE — Telephone Encounter (Signed)
submitted

## 2019-12-22 ENCOUNTER — Encounter: Payer: Self-pay | Admitting: Orthopedic Surgery

## 2019-12-22 NOTE — Progress Notes (Signed)
Office Visit Note   Patient: [name]           Date of Birth: Jan 04, 1986           MRN: [id number] Visit Date: 12/21/2019 Requested by: Nolene Ebbs, MD [address] [place],  [place] [address] PCP: Nolene Ebbs, MD  Subjective: Chief Complaint  Patient presents with  . Left Foot - Pain  . Left Knee - Pain    HPI: [name] is a 34 y.o. male who presents to the office complaining of left toe pain.  Patient notes on 12/10/2019 he fell and his leg bent under him, causing severe left great toe pain.  He went to the ED where x-rays revealed a fracture of the proximal phalanx of the left great toe.  He has been ambulating full weightbearing with crutches and a postop shoe.  He is taking Tylenol as needed.  He has a history of end-stage renal disease for which he receives dialysis 3 times a week so he is unable to take anti-inflammatories.  He works at [employer] where they are good at working with him to continue with sedentary work.  He also injured his knee but states that this pain is significantly improving with time.  He has history of left knee end-stage osteoarthritis..                ROS:  All systems reviewed are negative as they relate to the chief complaint within the history of present illness.  Patient denies fevers or chills.  Assessment & Plan: Visit Diagnoses:  1. Pain in left foot   2. Displaced fracture of proximal phalanx of left great toe, initial encounter for closed fracture     Plan: Patient is a 34 year old male with history of end-stage renal disease who presents complaining of left toe pain.  He had a fall on 12/10/2019 and went to the ED where a fracture was found of the proximal phalanx.  This fracture extends into the IP joint of the left great toe.  He has 1.9 mm of displacement of the joint surface.  It is fairly well aligned.  Due to patient's history of end-stage renal disease, we will continue with nonoperative treatment involving only  weightbearing as needed in a postop shoe with crutches.  Recommended he not put weight on it if he can help it.  Patient agrees with plan.  Provided work note to continue sitdown work.  Follow-up in 2 [place] for clinical recheck.  Also prescribed Norco as pain control is difficult for him due to lack of anti-inflammatories.    Follow-Up Instructions: No follow-ups on file.   Orders:  Orders Placed This Encounter  Procedures  . XR Foot Complete Left   Meds ordered this encounter  Medications  . HYDROcodone-acetaminophen (NORCO/VICODIN) 5-325 MG tablet    Sig: Take 1 tablet by mouth every 8 (eight) hours as needed for moderate pain.    Dispense:  20 tablet    Refill:  0      Procedures: No procedures performed   Clinical Data: No additional findings.  Objective: Vital Signs: There were no vitals taken for this visit.  Physical Exam:  Constitutional: Patient appears well-developed HEENT:  Head: Normocephalic Eyes:EOM are normal Neck: Normal range of motion Cardiovascular: Normal rate Pulmonary/chest: Effort normal Neurologic: Patient is alert Skin: Skin is warm Psychiatric: Patient has normal mood and affect  Ortho Exam:  Tender to palpation through the great toe.  Pain worse  with flexion/extension of the IP joint.  2+ DP pulse.  Sensation intact through all the toes of the left foot.  No significant tenderness throughout the midfoot, hindfoot.  No tenderness to palpation throughout the medial or lateral malleoli.  Dorsiflexion/plantarflexion of the left ankle is intact.  Specialty Comments:  No specialty comments available.  Imaging: No results found.   PMFS History: Patient Active Problem List   Diagnosis Date Noted  . Abdominal pain 07/04/2017  . Gastritis and gastroduodenitis 11/19/2016  . Increased anion gap metabolic acidosis 50/53/9767  . Intractable abdominal pain   . Common bile duct dilatation   . Cholelithiasis without cholecystitis   . Intractable  nausea and vomiting 10/08/2016  . Hematemesis 08/14/2016  . GERD (gastroesophageal reflux disease) 08/14/2016  . Essential hypertension 08/14/2016  . GIB (gastrointestinal bleeding) 08/14/2016  . SIRS (systemic inflammatory response syndrome) ([place]) 08/14/2016  . CKD (chronic kidney disease) stage V requiring chronic dialysis ([place]) 06/11/2016  . Non-intractable vomiting with nausea 06/11/2016  . Hepatic steatosis 06/11/2016  . Cholelithiasis 06/11/2016  . Localized osteoarthritis of left knee 07/11/2015  . Abdominal pain, epigastric 12/05/2014  . Lapband April 2016 11/27/2014  . Arthritis of knee 03/27/2014  . Morbid obesity, weight 291, BMI - 47 02/15/2014  . Hyperparathyroidism, secondary ([place]) 03/14/2013   Past Medical History:  Diagnosis Date  . Arthritis    "all over" (11/19/2016)  . Chronic lower back pain   . Complication of anesthesia    A little while to wake up after knee surgery in 2008  . Dizziness    when coming off of dialysis  . ESRD (end stage renal disease) on dialysis [facility])    MWF and goes to [facility] (11/19/2016)  . Family history of anesthesia complication    mom slow to wake up  . GERD (gastroesophageal reflux disease)    takes Omeprazole as needed  . HALPFXTK(240.9)    "monthly" (11/19/2016)  . History of hiatal hernia   . Hyperparathyroidism ([place])   . Hypertension   . Joint pain   . Joint swelling   . Morbid obesity ([place])   . PONV (postoperative nausea and vomiting)   . Renal insufficiency    Pt is on dyalisis and has been for 14 years  . Thyroid disease     Family History  Problem Relation Age of Onset  . Hypertension Mother   . Diabetes Father   . Kidney Stones Sister   . Diabetes Maternal Grandmother   . Liver cancer Maternal Uncle   . Colon cancer Neg Hx   . Colon polyps Neg Hx   . Esophageal cancer Neg Hx   . Rectal cancer Neg Hx   . Stomach cancer Neg Hx     Past Surgical History:  Procedure Laterality Date  . AV FISTULA  PLACEMENT Bilateral 2005,2007   "right; left"  . AV FISTULA REPAIR Right 2007   "tried to clean it out; couldn't do it"  . BOTOX INJECTION N/A 05/25/2019   Procedure: BOTOX INJECTION;  Surgeon: Doran Stabler, MD;  Location: [facility];  Service: Gastroenterology;  Laterality: N/A;  . BREATH TEK H PYLORI N/A 05/15/2014   Procedure: BREATH TEK H PYLORI;  Surgeon: Alphonsa Overall, MD;  Location: [facility];  Service: General;  Laterality: N/A;  . CHOLECYSTECTOMY N/A 11/23/2016   Procedure: LAPAROSCOPIC CHOLECYSTECTOMY WITH INTRAOPERATIVE CHOLANGIOGRAM;  Surgeon: Erroll Luna, MD;  Location: [place];  Service: General;  Laterality: N/A;  . ESOPHAGOGASTRODUODENOSCOPY N/A 12/07/2014  Procedure: ESOPHAGOGASTRODUODENOSCOPY (EGD);  Surgeon: Alphonsa Overall, MD;  Location: [facility] ENDOSCOPY;  Service: General;  Laterality: N/A;  . ESOPHAGOGASTRODUODENOSCOPY N/A 12/17/2014   Procedure: ESOPHAGOGASTRODUODENOSCOPY (EGD);  Surgeon: Alphonsa Overall, MD;  Location: [place];  Service: General;  Laterality: N/A;  . ESOPHAGOGASTRODUODENOSCOPY N/A 08/16/2016   Procedure: ESOPHAGOGASTRODUODENOSCOPY (EGD);  Surgeon: Jerene Bears, MD;  Location: [place];  Service: Gastroenterology;  Laterality: N/A;  . ESOPHAGOGASTRODUODENOSCOPY (EGD) WITH PROPOFOL N/A 07/05/2017   Procedure: ESOPHAGOGASTRODUODENOSCOPY (EGD) WITH PROPOFOL;  Surgeon: Milus Banister, MD;  Location: [facility] ENDOSCOPY;  Service: Endoscopy;  Laterality: N/A;  . ESOPHAGOGASTRODUODENOSCOPY (EGD) WITH PROPOFOL N/A 05/25/2019   Procedure: ESOPHAGOGASTRODUODENOSCOPY (EGD) WITH PROPOFOL;  Surgeon: Doran Stabler, MD;  [facility];  Service: Gastroenterology;  Laterality: N/A;  . HERNIA REPAIR  11/2014   w/lap band OR  . JOINT REPLACEMENT    . KNEE ARTHROSCOPY Left 2007  . LAPAROSCOPIC GASTRIC BANDING N/A 11/12/2014   Procedure: LAPAROSCOPIC GASTRIC BANDING;  Surgeon: Alphonsa Overall, MD;  [facility];  Service: General;  Laterality: N/A;  .  PARATHYROIDECTOMY N/A 03/30/2013   Procedure: TOTAL PARATHYROIDECTOMY WITH AUTOTRANSPLANT;  Surgeon: Earnstine Regal, MD;  Location: [place];  Service: General;  Laterality: N/A;  Autotransplant to left lower arm.  Marland Kitchen TOTAL KNEE ARTHROPLASTY Right 03/27/2014   Procedure: UNICOMPARTMENTAL ARTHROPLASTY;  Surgeon: Meredith Pel, MD;  Location: [place];  Service: Orthopedics;  Laterality: Right;   Social History   Occupational History  . Not on file  Tobacco Use  . Smoking status: Former Smoker    Packs/day: 0.10    Years: 16.00    Pack years: 1.60    Types: Cigarettes    Quit date: 01/17/2019    Years since quitting: 0.9  . Smokeless tobacco: Never Used  Substance and Sexual Activity  . Alcohol use: No    Alcohol/week: 0.0 standard drinks  . Drug use: No  . Sexual activity: Yes

## 2020-01-18 ENCOUNTER — Ambulatory Visit (INDEPENDENT_AMBULATORY_CARE_PROVIDER_SITE_OTHER): Payer: Medicare Other | Admitting: Orthopedic Surgery

## 2020-01-18 ENCOUNTER — Other Ambulatory Visit: Payer: Self-pay | Admitting: Orthopedic Surgery

## 2020-01-18 DIAGNOSIS — M79672 Pain in left foot: Secondary | ICD-10-CM | POA: Diagnosis not present

## 2020-01-18 DIAGNOSIS — M1712 Unilateral primary osteoarthritis, left knee: Secondary | ICD-10-CM

## 2020-01-18 MED ORDER — TRAMADOL HCL 50 MG PO TABS: 50.0000 mg | ORAL_TABLET | Freq: Two times a day (BID) | ORAL | 0 refills | Status: DC | PRN

## 2020-01-20 ENCOUNTER — Encounter: Payer: Self-pay | Admitting: Orthopedic Surgery

## 2020-01-20 NOTE — Progress Notes (Signed)
Office Visit Note   Patient: [name]           Date of Birth: Dec 06, 1985           MRN: [id number] Visit Date: 01/18/2020 Requested by: Alex Ebbs, MD [address] [place],  [place] [address] PCP: Alex Ebbs, MD  Subjective: Chief Complaint  Patient presents with  . Left Foot - Follow-up  . Follow-up    HPI: [name] is a patient here to follow-up left foot proximal phalanx fracture of the toe as well as left knee pain.  In general the left foot is doing better.  He is able to be fully weightbearing on it.  Patient also describes left knee pain.  Has known valgus arthritis in that left knee.  Has right knee unicompartmental knee replacement laterally but his left knee deformity is too severe for unicompartmental knee replacement.  He has not used any crutches for the past 3 days.  He cannot take anti-inflammatories because he is on dialysis for kidney failure.              ROS: All systems reviewed are negative as they relate to the chief complaint within the history of present illness.  Patient denies  fevers or chills.   Assessment & Plan: Visit Diagnoses:  1. Pain in left foot   2. Arthritis of left knee     Plan: Impression is left knee valgus arthritis which is end-stage and debilitating.  No effusion today.  Weight loss encouraged.  Left foot is doing well with no real problems in terms of fracture sequelae.  Plan is nonweightbearing quad strengthening exercises with delay of knee replacement on the left-hand side as long as possible.  It is explained to the patient that he will be at high risk for knee replacement based on his body habitus and the amount of deformity present.  Patient understands and will follow up as needed.  Follow-Up Instructions: Return if symptoms worsen or fail to improve.   Orders:  No orders of the defined types were placed in this encounter.  No orders of the defined types were placed in this encounter.     Procedures: No  procedures performed   Clinical Data: No additional findings.  Objective: Vital Signs: There were no vitals taken for this visit.  Physical Exam:   Constitutional: Patient appears well-developed HEENT:  Head: Normocephalic Eyes:EOM are normal Neck: Normal range of motion Cardiovascular: Normal rate Pulmonary/chest: Effort normal Neurologic: Patient is alert Skin: Skin is warm Psychiatric: Patient has normal mood and affect    Ortho Exam: Ortho exam demonstrates full active and passive range of motion of the ankle on the left-hand side.  Knee has valgus alignment but no effusion.  Extensor mechanism is intact.  Patient has full extension and flexion to about 105.  Pedal pulses palpable.  No real tenderness to palpation around the toe region.  That is on the left great toe.  Tramadol as prescribed for pain relief and as a one-time dose only.  Specialty Comments:  No specialty comments available.  Imaging: No results found.   PMFS History: Patient Active Problem List   Diagnosis Date Noted  . Abdominal pain 07/04/2017  . Gastritis and gastroduodenitis 11/19/2016  . Increased anion gap metabolic acidosis 41/74/0814  . Intractable abdominal pain   . Common bile duct dilatation   . Cholelithiasis without cholecystitis   . Intractable nausea and vomiting 10/08/2016  . Hematemesis 08/14/2016  . GERD (gastroesophageal reflux  disease) 08/14/2016  . Essential hypertension 08/14/2016  . GIB (gastrointestinal bleeding) 08/14/2016  . SIRS (systemic inflammatory response syndrome) ([place]) 08/14/2016  . CKD (chronic kidney disease) stage V requiring chronic dialysis ([place]) 06/11/2016  . Non-intractable vomiting with nausea 06/11/2016  . Hepatic steatosis 06/11/2016  . Cholelithiasis 06/11/2016  . Localized osteoarthritis of left knee 07/11/2015  . Abdominal pain, epigastric 12/05/2014  . Lapband April 2016 11/27/2014  . Arthritis of knee 03/27/2014  . Morbid obesity, weight 291,  BMI - 47 02/15/2014  . Hyperparathyroidism, secondary ([place]) 03/14/2013   Past Medical History:  Diagnosis Date  . Arthritis    "all over" (11/19/2016)  . Chronic lower back pain   . Complication of anesthesia    A little while to wake up after knee surgery in 2008  . Dizziness    when coming off of dialysis  . ESRD (end stage renal disease) on dialysis [facility])    MWF and goes to [facility] (11/19/2016)  . Family history of anesthesia complication    mom slow to wake up  . GERD (gastroesophageal reflux disease)    takes Omeprazole as needed  . OZDGUYQI(347.4)    "monthly" (11/19/2016)  . History of hiatal hernia   . Hyperparathyroidism ([place])   . Hypertension   . Joint pain   . Joint swelling   . Morbid obesity ([place])   . PONV (postoperative nausea and vomiting)   . Renal insufficiency    Pt is on dyalisis and has been for 14 years  . Thyroid disease     Family History  Problem Relation Age of Onset  . Hypertension Mother   . Diabetes Father   . Kidney Stones Sister   . Diabetes Maternal Grandmother   . Liver cancer Maternal Uncle   . Colon cancer Neg Hx   . Colon polyps Neg Hx   . Esophageal cancer Neg Hx   . Rectal cancer Neg Hx   . Stomach cancer Neg Hx     Past Surgical History:  Procedure Laterality Date  . AV FISTULA PLACEMENT Bilateral 2005,2007   "right; left"  . AV FISTULA REPAIR Right 2007   "tried to clean it out; couldn't do it"  . BOTOX INJECTION N/A 05/25/2019   Procedure: BOTOX INJECTION;  Surgeon: Doran Stabler, MD;  [facility];  Service: Gastroenterology;  Laterality: N/A;  . BREATH TEK H PYLORI N/A 05/15/2014   Procedure: BREATH TEK H PYLORI;  Surgeon: Alphonsa Overall, MD;  Location: [facility];  Service: General;  Laterality: N/A;  . CHOLECYSTECTOMY N/A 11/23/2016   Procedure: LAPAROSCOPIC CHOLECYSTECTOMY WITH INTRAOPERATIVE CHOLANGIOGRAM;  Surgeon: Erroll Luna, MD;  Location: [place];  Service: General;  Laterality: N/A;  .  ESOPHAGOGASTRODUODENOSCOPY N/A 12/07/2014   Procedure: ESOPHAGOGASTRODUODENOSCOPY (EGD);  Surgeon: Alphonsa Overall, MD;  Location: [facility] ENDOSCOPY;  Service: General;  Laterality: N/A;  . ESOPHAGOGASTRODUODENOSCOPY N/A 12/17/2014   Procedure: ESOPHAGOGASTRODUODENOSCOPY (EGD);  Surgeon: Alphonsa Overall, MD;  Location: [place];  Service: General;  Laterality: N/A;  . ESOPHAGOGASTRODUODENOSCOPY N/A 08/16/2016   Procedure: ESOPHAGOGASTRODUODENOSCOPY (EGD);  Surgeon: Jerene Bears, MD;  Location: [place];  Service: Gastroenterology;  Laterality: N/A;  . ESOPHAGOGASTRODUODENOSCOPY (EGD) WITH PROPOFOL N/A 07/05/2017   Procedure: ESOPHAGOGASTRODUODENOSCOPY (EGD) WITH PROPOFOL;  Surgeon: Milus Banister, MD;  Location: [facility] ENDOSCOPY;  Service: Endoscopy;  Laterality: N/A;  . ESOPHAGOGASTRODUODENOSCOPY (EGD) WITH PROPOFOL N/A 05/25/2019   Procedure: ESOPHAGOGASTRODUODENOSCOPY (EGD) WITH PROPOFOL;  Surgeon: Doran Stabler, MD;  [facility];  Service: Gastroenterology;  Laterality: N/A;  . HERNIA REPAIR  11/2014   w/lap band OR  . JOINT REPLACEMENT    . KNEE ARTHROSCOPY Left 2007  . LAPAROSCOPIC GASTRIC BANDING N/A 11/12/2014   Procedure: LAPAROSCOPIC GASTRIC BANDING;  Surgeon: Alphonsa Overall, MD;  [facility];  Service: General;  Laterality: N/A;  . PARATHYROIDECTOMY N/A 03/30/2013   Procedure: TOTAL PARATHYROIDECTOMY WITH AUTOTRANSPLANT;  Surgeon: Earnstine Regal, MD;  Location: [place];  Service: General;  Laterality: N/A;  Autotransplant to left lower arm.  Marland Kitchen TOTAL KNEE ARTHROPLASTY Right 03/27/2014   Procedure: UNICOMPARTMENTAL ARTHROPLASTY;  Surgeon: Meredith Pel, MD;  Location: [place];  Service: Orthopedics;  Laterality: Right;   Social History   Occupational History  . Not on file  Tobacco Use  . Smoking status: Former Smoker    Packs/day: 0.10    Years: 16.00    Pack years: 1.60    Types: Cigarettes    Quit date: 01/17/2019    Years since quitting: 1.0  . Smokeless tobacco: Never  Used  Vaping Use  . Vaping Use: Never used  Substance and Sexual Activity  . Alcohol use: No    Alcohol/week: 0.0 standard drinks  . Drug use: No  . Sexual activity: Yes

## 2020-04-02 ENCOUNTER — Other Ambulatory Visit: Payer: Self-pay | Admitting: Orthopedic Surgery

## 2020-04-02 NOTE — Telephone Encounter (Signed)
Dr. Marlou Sa OOT for week. OK for # 20 tabs. Thanks

## 2020-04-02 NOTE — Telephone Encounter (Signed)
Please advise 

## 2020-04-03 NOTE — Telephone Encounter (Signed)
I sent this to Dr. Lorin Mercy yesterday in error. Would you like for me to call this in or do you think that Dr. Marlou Sa will have different instructions?

## 2020-04-03 NOTE — Telephone Encounter (Signed)
Pls advise.  

## 2020-04-04 NOTE — Telephone Encounter (Signed)
Okay for that

## 2020-04-05 NOTE — Telephone Encounter (Signed)
Can you send in this refill please. Thanks.

## 2020-08-29 ENCOUNTER — Other Ambulatory Visit: Payer: Self-pay | Admitting: Surgical

## 2020-08-29 NOTE — Telephone Encounter (Signed)
Pls advise.  

## 2020-09-02 ENCOUNTER — Telehealth: Payer: Self-pay

## 2020-09-02 NOTE — Telephone Encounter (Signed)
Received rxrf request from [facility] on behalf of patient for request on refill of Ultram '50mg'$  1 po q 12hrs #50

## 2020-09-04 NOTE — Telephone Encounter (Signed)
Needs follow-up visit if having continued pain rather than refilling pain medication

## 2020-09-21 ENCOUNTER — Encounter (INDEPENDENT_AMBULATORY_CARE_PROVIDER_SITE_OTHER): Payer: Self-pay

## 2020-09-26 ENCOUNTER — Ambulatory Visit (INDEPENDENT_AMBULATORY_CARE_PROVIDER_SITE_OTHER): Payer: Medicare Other | Admitting: Family Medicine

## 2020-10-10 ENCOUNTER — Ambulatory Visit (INDEPENDENT_AMBULATORY_CARE_PROVIDER_SITE_OTHER): Payer: Medicare Other | Admitting: Family Medicine

## 2020-11-12 ENCOUNTER — Ambulatory Visit (HOSPITAL_COMMUNITY)
Admission: EM | Admit: 2020-11-12 | Discharge: 2020-11-12 | Disposition: A | Discharge status: Other Acute Inpatient Hospital | Payer: Medicare Other

## 2020-11-12 ENCOUNTER — Emergency Department (HOSPITAL_COMMUNITY)
Admission: EM | Admit: 2020-11-12 | Discharge: 2020-11-13 | Disposition: A | Discharge status: Home / Self Care | Payer: Medicare Other | Attending: Emergency Medicine | Admitting: Emergency Medicine

## 2020-11-12 ENCOUNTER — Emergency Department (HOSPITAL_COMMUNITY): Payer: Medicare Other

## 2020-11-12 ENCOUNTER — Other Ambulatory Visit: Payer: Self-pay

## 2020-11-12 ENCOUNTER — Encounter (HOSPITAL_COMMUNITY): Payer: Self-pay

## 2020-11-12 DIAGNOSIS — Z96651 Presence of right artificial knee joint: Secondary | ICD-10-CM | POA: Diagnosis not present

## 2020-11-12 DIAGNOSIS — N186 End stage renal disease: Secondary | ICD-10-CM | POA: Diagnosis not present

## 2020-11-12 DIAGNOSIS — K219 Gastro-esophageal reflux disease without esophagitis: Secondary | ICD-10-CM | POA: Insufficient documentation

## 2020-11-12 DIAGNOSIS — Z87891 Personal history of nicotine dependence: Secondary | ICD-10-CM | POA: Diagnosis not present

## 2020-11-12 DIAGNOSIS — I120 Hypertensive chronic kidney disease with stage 5 chronic kidney disease or end stage renal disease: Secondary | ICD-10-CM | POA: Insufficient documentation

## 2020-11-12 DIAGNOSIS — R197 Diarrhea, unspecified: Secondary | ICD-10-CM | POA: Insufficient documentation

## 2020-11-12 DIAGNOSIS — R1013 Epigastric pain: Secondary | ICD-10-CM | POA: Diagnosis present

## 2020-11-12 DIAGNOSIS — Z992 Dependence on renal dialysis: Secondary | ICD-10-CM | POA: Insufficient documentation

## 2020-11-12 DIAGNOSIS — R1084 Generalized abdominal pain: Secondary | ICD-10-CM

## 2020-11-12 LAB — COMPREHENSIVE METABOLIC PANEL
ALT: 16 U/L (ref 0–44)
AST: 14 U/L — ABNORMAL LOW (ref 15–41)
Albumin: 4.1 g/dL (ref 3.5–5.0)
Alkaline Phosphatase: 47 U/L (ref 38–126)
Anion gap: 18 — ABNORMAL HIGH (ref 5–15)
BUN: 85 mg/dL — ABNORMAL HIGH (ref 6–20)
CO2: 22 mmol/L (ref 22–32)
Calcium: 8.4 mg/dL — ABNORMAL LOW (ref 8.9–10.3)
Chloride: 94 mmol/L — ABNORMAL LOW (ref 98–111)
Creatinine, Ser: 13.45 mg/dL — ABNORMAL HIGH (ref 0.61–1.24)
GFR, Estimated: 4 mL/min — ABNORMAL LOW (ref >60)
Glucose, Bld: 105 mg/dL — ABNORMAL HIGH (ref 70–99)
Potassium: 4.2 mmol/L (ref 3.5–5.1)
Sodium: 134 mmol/L — ABNORMAL LOW (ref 135–145)
Total Bilirubin: 1 mg/dL (ref 0.3–1.2)
Total Protein: 8 g/dL (ref 6.5–8.1)

## 2020-11-12 LAB — CBC
HCT: 38.5 % — ABNORMAL LOW (ref 39.0–52.0)
Hemoglobin: 13.1 g/dL (ref 13.0–17.0)
MCH: 32 pg (ref 26.0–34.0)
MCHC: 34 g/dL (ref 30.0–36.0)
MCV: 94.1 fL (ref 80.0–100.0)
Platelets: 228 10*3/uL (ref 150–400)
RBC: 4.09 MIL/uL — ABNORMAL LOW (ref 4.22–5.81)
RDW: 13 % (ref 11.5–15.5)
WBC: 15.3 10*3/uL — ABNORMAL HIGH (ref 4.0–10.5)
nRBC: 0 % (ref 0.0–0.2)

## 2020-11-12 LAB — LIPASE, BLOOD: Lipase: 44 U/L (ref 11–51)

## 2020-11-12 MED ORDER — IOHEXOL 300 MG/ML  SOLN
100.0000 mL | Freq: Once | INTRAMUSCULAR | Status: AC | PRN
  Administered 2020-11-12: 100 mL via INTRAVENOUS

## 2020-11-12 MED ORDER — ONDANSETRON HCL 4 MG/2ML IJ SOLN
4.0000 mg | Freq: Once | INTRAMUSCULAR | Status: AC
  Administered 2020-11-12: 4 mg via INTRAVENOUS
  Filled 2020-11-12: qty 2

## 2020-11-12 MED ORDER — MORPHINE SULFATE (PF) 4 MG/ML IV SOLN
4.0000 mg | Freq: Once | INTRAVENOUS | Status: AC
  Administered 2020-11-12: 4 mg via INTRAVENOUS
  Filled 2020-11-12: qty 1

## 2020-11-12 MED ORDER — PANTOPRAZOLE SODIUM 40 MG IV SOLR
40.0000 mg | Freq: Once | INTRAVENOUS | Status: AC
  Administered 2020-11-12: 40 mg via INTRAVENOUS
  Filled 2020-11-12: qty 40

## 2020-11-12 NOTE — ED Provider Notes (Signed)
[place] EMERGENCY DEPARTMENT Provider Note   CSN: [id number] Arrival date & time: 11/12/20  1834     History Chief Complaint  Patient presents with  . Abdominal Pain    [name] is a 35 y.o. male.  The history is provided by the patient and medical records.  Abdominal Pain  [name] is a 35 y.o. male who presents to the Emergency Department complaining of abdominal pain. He presents the emergency department complaining of severe epigastric abdominal pain that started last night. He has associated nausea, green emesis and diarrhea. He denies any fevers, chest pain, shortness of breath. He does have a history of ESR D and dialysis Monday, Wednesday, Friday. Last dialysis social Monday and he tolerated the session without problems. He had similar symptoms in the past secondary to cholecystitis. He is status post lap band and cholecystectomy.    Past Medical History:  Diagnosis Date  . Arthritis    "all over" (11/19/2016)  . Chronic lower back pain   . Complication of anesthesia    A little while to wake up after knee surgery in 2008  . Dizziness    when coming off of dialysis  . ESRD (end stage renal disease) on dialysis [facility])    MWF and goes to [facility] (11/19/2016)  . Family history of anesthesia complication    mom slow to wake up  . GERD (gastroesophageal reflux disease)    takes Omeprazole as needed  . EYCXKGYJ(856.3)    "monthly" (11/19/2016)  . History of hiatal hernia   . Hyperparathyroidism ([place])   . Hypertension   . Joint pain   . Joint swelling   . Morbid obesity ([place])   . PONV (postoperative nausea and vomiting)   . Renal insufficiency    Pt is on dyalisis and has been for 14 years  . Thyroid disease     Patient Active Problem List   Diagnosis Date Noted  . Abdominal pain 07/04/2017  . Gastritis and gastroduodenitis 11/19/2016  . Increased anion gap metabolic acidosis 14/97/0263  . Intractable abdominal pain   .  Common bile duct dilatation   . Cholelithiasis without cholecystitis   . Intractable nausea and vomiting 10/08/2016  . Hematemesis 08/14/2016  . GERD (gastroesophageal reflux disease) 08/14/2016  . Essential hypertension 08/14/2016  . GIB (gastrointestinal bleeding) 08/14/2016  . SIRS (systemic inflammatory response syndrome) ([place]) 08/14/2016  . CKD (chronic kidney disease) stage V requiring chronic dialysis ([place]) 06/11/2016  . Non-intractable vomiting with nausea 06/11/2016  . Hepatic steatosis 06/11/2016  . Cholelithiasis 06/11/2016  . Localized osteoarthritis of left knee 07/11/2015  . Abdominal pain, epigastric 12/05/2014  . Lapband April 2016 11/27/2014  . Arthritis of knee 03/27/2014  . Morbid obesity, weight 291, BMI - 47 02/15/2014  . Hyperparathyroidism, secondary ([place]) 03/14/2013    Past Surgical History:  Procedure Laterality Date  . AV FISTULA PLACEMENT Bilateral 2005,2007   "right; left"  . AV FISTULA REPAIR Right 2007   "tried to clean it out; couldn't do it"  . BOTOX INJECTION N/A 05/25/2019   Procedure: BOTOX INJECTION;  Surgeon: Doran Stabler, MD;  Location: [facility] ENDOSCOPY;  Service: Gastroenterology;  Laterality: N/A;  . BREATH TEK H PYLORI N/A 05/15/2014   Procedure: BREATH TEK H PYLORI;  Surgeon: Alphonsa Overall, MD;  Location: [facility];  Service: General;  Laterality: N/A;  . CHOLECYSTECTOMY N/A 11/23/2016   Procedure: LAPAROSCOPIC CHOLECYSTECTOMY WITH INTRAOPERATIVE CHOLANGIOGRAM;  Surgeon: Erroll Luna, MD;  Location: [facility] OR;  Service: General;  Laterality: N/A;  . ESOPHAGOGASTRODUODENOSCOPY N/A 12/07/2014   Procedure: ESOPHAGOGASTRODUODENOSCOPY (EGD);  Surgeon: Alphonsa Overall, MD;  Location[facility] ENDOSCOPY;  Service: General;  Laterality: N/A;  . ESOPHAGOGASTRODUODENOSCOPY N/A 12/17/2014   Procedure: ESOPHAGOGASTRODUODENOSCOPY (EGD);  Surgeon: Alphonsa Overall, MD;  Location: [place];  Service: General;  Laterality: N/A;  . ESOPHAGOGASTRODUODENOSCOPY N/A 08/16/2016    Procedure: ESOPHAGOGASTRODUODENOSCOPY (EGD);  Surgeon: Jerene Bears, MD;  Location: [place];  Service: Gastroenterology;  Laterality: N/A;  . ESOPHAGOGASTRODUODENOSCOPY (EGD) WITH PROPOFOL N/A 07/05/2017   Procedure: ESOPHAGOGASTRODUODENOSCOPY (EGD) WITH PROPOFOL;  Surgeon: Milus Banister, MD;  Location: [facility] ENDOSCOPY;  Service: Endoscopy;  Laterality: N/A;  . ESOPHAGOGASTRODUODENOSCOPY (EGD) WITH PROPOFOL N/A 05/25/2019   Procedure: ESOPHAGOGASTRODUODENOSCOPY (EGD) WITH PROPOFOL;  Surgeon: Doran Stabler, MD;  Location: [facility] ENDOSCOPY;  Service: Gastroenterology;  Laterality: N/A;  . HERNIA REPAIR  11/2014   w/lap band OR  . JOINT REPLACEMENT    . KNEE ARTHROSCOPY Left 2007  . LAPAROSCOPIC GASTRIC BANDING N/A 11/12/2014   Procedure: LAPAROSCOPIC GASTRIC BANDING;  Surgeon: Alphonsa Overall, MD;  Location: [facility] ORS;  Service: General;  Laterality: N/A;  . PARATHYROIDECTOMY N/A 03/30/2013   Procedure: TOTAL PARATHYROIDECTOMY WITH AUTOTRANSPLANT;  Surgeon: Earnstine Regal, MD;  Location: [place];  Service: General;  Laterality: N/A;  Autotransplant to left lower arm.  Marland Kitchen TOTAL KNEE ARTHROPLASTY Right 03/27/2014   Procedure: UNICOMPARTMENTAL ARTHROPLASTY;  Surgeon: Meredith Pel, MD;  Location: [place];  Service: Orthopedics;  Laterality: Right;       Family History  Problem Relation Age of Onset  . Hypertension Mother   . Diabetes Father   . Kidney Stones Sister   . Diabetes Maternal Grandmother   . Liver cancer Maternal Uncle   . Colon cancer Neg Hx   . Colon polyps Neg Hx   . Esophageal cancer Neg Hx   . Rectal cancer Neg Hx   . Stomach cancer Neg Hx     Social History   Tobacco Use  . Smoking status: Former Smoker    Packs/day: 0.10    Years: 16.00    Pack years: 1.60    Types: Cigarettes    Quit date: 01/17/2019    Years since quitting: 1.8  . Smokeless tobacco: Never Used  Vaping Use  . Vaping Use: Never used  Substance Use Topics  . Alcohol use: No    Alcohol/week:  0.0 standard drinks  . Drug use: No    Home Medications Prior to Admission medications   Medication Sig Start Date End Date Taking? Authorizing Provider  albuterol (PROVENTIL HFA;VENTOLIN HFA) 108 (90 Base) MCG/ACT inhaler Inhale 2 puffs into the lungs every 4 (four) hours as needed for wheezing or shortness of breath. 05/14/16   Lysbeth Penner, [name]  b complex-vitamin c-folic acid (NEPHRO-VITE) 0.8 MG TABS tablet Take 1 tablet by mouth daily.  08/16/16   [provider]  calcium carbonate (TUMS EXTRA STRENGTH 750) 750 MG chewable tablet Chew 4 tablets by mouth 3 (three) times daily.    [provider]  famotidine (PEPCID) 20 MG tablet Take 1 tablet (20 mg total) by mouth daily. 04/27/19   Deliah Boston, [name]  meloxicam (MOBIC) 15 MG tablet Take 0.5-1 tablets (7.5-15 mg total) by mouth daily as needed for pain. 08/01/19   [name], Legrand Como, MD  metoCLOPramide (REGLAN) 5 MG tablet Take 1 tablet (5 mg total) by mouth 2 (two) times daily as needed for nausea. 04/27/19  Nuala Alpha A, [name]  pantoprazole (PROTONIX) 40 MG tablet Take 1 tablet (40 mg total) by mouth daily. Take 30 minutes before breakfast. Patient taking differently: Take 40 mg by mouth daily as needed (acid refux). Take 30 minutes before breakfast. 05/08/19   Levin Erp, PA  sevelamer carbonate (RENVELA) 2.4 G PACK Take 2.4 g by mouth 3 (three) times daily with meals.     [provider]  tiZANidine (ZANAFLEX) 4 MG tablet Take 4 mg by mouth every 8 (eight) hours as needed for muscle spasms.  01/11/18   [provider]  traMADol (ULTRAM) 50 MG tablet TAKE 1 TABLET(50 MG) BY MOUTH EVERY 12 HOURS AS NEEDED 04/05/20   Magnant, Gerrianne Scale, [name]    Allergies    Dilaudid [hydromorphone hcl], Amlodipine, Ascorbate, and Midodrine  Review of Systems   Review of Systems  Gastrointestinal: Positive for abdominal pain.  All other systems reviewed and are negative.   Physical Exam Updated  Vital Signs BP 129/70   Pulse 73   Temp 98.3 F (36.8 C) (Oral)   Resp 16   Ht '5\' 7"'  (1.702 m)   Wt (!) 142.9 kg   SpO2 96%   BMI 49.34 kg/m   Physical Exam Vitals and nursing note reviewed.  Constitutional:      General: He is in acute distress.     Appearance: He is well-developed. He is ill-appearing.  HENT:     Head: Normocephalic and atraumatic.  Cardiovascular:     Rate and Rhythm: Normal rate and regular rhythm.     Heart sounds: No murmur heard.   Pulmonary:     Effort: Pulmonary effort is normal. No respiratory distress.     Breath sounds: Normal breath sounds.  Abdominal:     Palpations: Abdomen is soft.     Tenderness: There is abdominal tenderness. There is no guarding or rebound.     Comments: Moderate to severe upper abdominal tenderness  Musculoskeletal:        General: No tenderness.     Comments: Non pitting edema to BLE.    Skin:    General: Skin is warm and dry.  Neurological:     Mental Status: He is alert and oriented to person, place, and time.  Psychiatric:        Behavior: Behavior normal.     ED Results / Procedures / Treatments   Labs (all labs ordered are listed, but only abnormal results are displayed) Labs Reviewed  COMPREHENSIVE METABOLIC PANEL - Abnormal; Notable for the following components:      Result Value   Sodium 134 (*)    Chloride 94 (*)    Glucose, Bld 105 (*)    BUN 85 (*)    Creatinine, Ser 13.45 (*)    Calcium 8.4 (*)    AST 14 (*)    GFR, Estimated 4 (*)    Anion gap 18 (*)    All other components within normal limits  CBC - Abnormal; Notable for the following components:   WBC 15.3 (*)    RBC 4.09 (*)    HCT 38.5 (*)    All other components within normal limits  LIPASE, BLOOD    EKG EKG Interpretation  Date/Time:  Tuesday November 12 2020 20:19:15 EDT Ventricular Rate:  73 PR Interval:  176 QRS Duration: 114 QT Interval:  380 QTC Calculation: 418 R Axis:   46 Text Interpretation: Normal sinus rhythm  Normal ECG Confirmed by Quintella Reichert [phone])  on 11/12/2020 9:39:21 PM   Radiology DG Chest 2 View  Result Date: 11/12/2020 CLINICAL DATA:  Abdominal pain and nausea. EXAM: CHEST - 2 VIEW COMPARISON:  None. FINDINGS: The heart size and mediastinal contours are within normal limits. Surgical clips are seen within the neck soft tissues. Both lungs are clear. The visualized skeletal structures are unremarkable. IMPRESSION: No active cardiopulmonary disease. Electronically Signed   By: Virgina Norfolk M.D.   On: 11/12/2020 22:20   CT Abdomen Pelvis W Contrast  Result Date: 11/12/2020 CLINICAL DATA:  Acute abdominal pain. EXAM: CT ABDOMEN AND PELVIS WITH CONTRAST TECHNIQUE: Multidetector CT imaging of the abdomen and pelvis was performed using the standard protocol following bolus administration of intravenous contrast. CONTRAST:  16m OMNIPAQUE IOHEXOL 300 MG/ML  SOLN COMPARISON:  CT 04/27/2019 FINDINGS: Lower chest: No acute airspace disease or pleural effusion. Hepatobiliary: No focal hepatic abnormality. Cholecystectomy without biliary dilatation. Pancreas: No ductal dilatation or inflammation. Spleen: Normal in size without focal abnormality. Adrenals/Urinary Tract: No adrenal nodule. Bilateral renal parenchymal atrophy with multifocal cystic appearance of the kidneys. Scattered parenchymal calcifications as well as possible nonobstructing stones. No hydronephrosis or perinephric edema. Urinary bladder is nondistended. Stomach/Bowel: Patulous distal esophagus. Minimal fluid or ingested contents within the stomach. No gastric wall thickening. Decompressed small bowel without obstruction or inflammation. Normal appendix. Submucosal fatty infiltration throughout the colon consistent with prior or remote inflammation. No bowel wall thickening or acute inflammatory change. Occasional diverticulosis involving the distal descending and sigmoid colon without diverticulitis. Vascular/Lymphatic: Normal caliber  abdominal aorta. No acute vascular findings. No abdominopelvic adenopathy. Reproductive: Prostate is unremarkable. Other: No ascites or free air. Small fat containing umbilical hernia. Musculoskeletal: Mild degenerative change of the right hip. There are no acute or suspicious osseous abnormalities. IMPRESSION: 1. No acute abnormality or explanation for abdominal pain. 2. Polycystic kidney disease with chronic renal atrophy. 3. Minimal distal colonic diverticulosis without diverticulitis. 4. Small fat containing umbilical hernia. Electronically Signed   By: MKeith RakeM.D.   On: 11/12/2020 23:08    Procedures Procedures   Medications Ordered in ED Medications  morphine 4 MG/ML injection 4 mg (has no administration in time range)  ondansetron (ZOFRAN) injection 4 mg (has no administration in time range)  pantoprazole (PROTONIX) injection 40 mg (has no administration in time range)  morphine 4 MG/ML injection 4 mg (4 mg Intravenous Given 11/12/20 2153)  ondansetron (ZOFRAN) injection 4 mg (4 mg Intravenous Given 11/12/20 2153)  iohexol (OMNIPAQUE) 300 MG/ML solution 100 mL (100 mLs Intravenous Contrast Given 11/12/20 2246)    ED Course  I have reviewed the triage vital signs and the nursing notes.  Pertinent labs & imaging results that were available during my care of the patient were reviewed by me and considered in my medical decision making (see chart for details).    MDM Rules/Calculators/A&P                         patient here for evaluation of abdominal pain, vomiting, diarrhea. He has a history of ESR D. On initial presentation patient with severe distress, vomiting on the stretcher. History with pain medications. CBC with leukocytosis. CT abdomen pelvis was obtained that is negative for acute abnormality. On repeat assessment after meds patient is partially improved. Plan to provide additional meds and reassess. Patient care transferred pending medications and reassessment.  Final  Clinical Impression(s) / ED Diagnoses Final diagnoses:  None    Rx / DC Orders  ED Discharge Orders    None       Quintella Reichert, MD 11/12/20 2343

## 2020-11-12 NOTE — ED Triage Notes (Signed)
N/V/D started last night . Today Pt reports seeing blood when vomited. Pt with a Hx of gastric bypass . He also reported he had a reversal of the by -pass surgery.

## 2020-11-12 NOTE — ED Triage Notes (Signed)
Pt sent to ED from [facility]. Pt c/o abdominal pain and nausea that started yesterday. Pt has a hx of lap band surgery.

## 2020-11-12 NOTE — ED Triage Notes (Signed)
Emergency Medicine Provider Triage Evaluation Note  [name] , a 35 y.o. male  was evaluated in triage.  Pt complains of central abdominal pain that started last night, there is associated with nausea, vomiting, diarrhea.  Patient states this is similar to when he had his gallbladder removed, denies fevers, chills, chest pain or shortness of breath, denies any urinary symptoms.  Review of Systems  Positive: Central abdominal pain, nausea, vomiting, diarrhea Negative: Denies fevers, chills, chest pain, shortness of breath, urinary symptoms  Physical Exam  BP 110/71 (BP Location: Right Arm)   Pulse 78   Temp 98.3 F (36.8 C) (Oral)   Resp (!) 22   Ht '5\' 7"'$  (1.702 m)   Wt (!) 142.9 kg   SpO2 100%   BMI 49.34 kg/m  Gen:   Awake, no distress   HEENT:  Atraumatic  Resp:  Normal effort  Cardiac:  Normal rate  Abd:  Patient was noted epigastric periumbilical pain, negative rebound tenderness or peritoneal sign. MSK:   Moves extremities without difficulty  Neuro:  Speech clear   Medical Decision Making  Medically screening exam initiated at 8:31 PM.  Appropriate orders placed.  [name] was informed that the remainder of the evaluation will be completed by another provider, this initial triage assessment does not replace that evaluation, and the importance of remaining in the ED until their evaluation is complete.  Clinical Impression  Patient presents with central abdominal pain, lab work has been ordered, patient will need further work-up here in the ED.   Marcello Fennel, [name] 11/12/20 2033

## 2020-11-13 DIAGNOSIS — R1013 Epigastric pain: Secondary | ICD-10-CM | POA: Diagnosis not present

## 2020-11-13 MED ORDER — OXYCODONE-ACETAMINOPHEN 5-325 MG PO TABS
2.0000 | ORAL_TABLET | Freq: Once | ORAL | Status: AC
  Administered 2020-11-13: 2 via ORAL
  Filled 2020-11-13: qty 2

## 2021-03-27 ENCOUNTER — Telehealth: Payer: Self-pay

## 2021-03-27 NOTE — Telephone Encounter (Signed)
Referral notes received from [facility], Phone #: [phone], Fax #: [phone]   A copy of the notes have been placed in the scheduling box for check-out to pick-up and to enter referral. Original notes placed in file cabinet.

## 2021-06-04 NOTE — Progress Notes (Signed)
Cardiology CONSULT Note    Date:  06/05/2021   ID:  [name], DOB 02-Jan-1986, MRN [id number]  PCP:  Nolene Ebbs, MD  Cardiologist:  Fransico Him, MD   Chief Complaint  Patient presents with   Palpitations    History of Present Illness:  [name] is a 35 y.o. male who is being seen today for the evaluation of palpitations at the request of [place], [place], [place].  This is a 35 year old obese African-American male with a history of hypertension, GERD, end-stage renal disease related to HTN on HD who presents for evaluation of palpitations. He has been having palpitations since 2017 but they have persisted and he has become concerned about what is going on.  He describes it as a skipping sensation.  It is very sporadic and occurs about once weekly.  He denies any chest pain, SOB, DOE, PND, orthopnea, LE edema or syncope.    Past Medical History:  Diagnosis Date   Arthritis    "all over" (11/19/2016)   Chronic lower back pain    Complication of anesthesia    A little while to wake up after knee surgery in 2008   Dizziness    when coming off of dialysis   ESRD (end stage renal disease) on dialysis [facility])    MWF and goes to [facility] (11/19/2016)   Family history of anesthesia complication    mom slow to wake up   GERD (gastroesophageal reflux disease)    takes Omeprazole as needed   Headache(784.0)    "monthly" (11/19/2016)   History of hiatal hernia    Hyperparathyroidism ([place])    Hypertension    Joint pain    Joint swelling    Morbid obesity ([place])    PONV (postoperative nausea and vomiting)    Renal insufficiency    Pt is on dyalisis and has been for 14 years   Thyroid disease     Past Surgical History:  Procedure Laterality Date   AV FISTULA PLACEMENT Bilateral 2005,2007   "right; left"   AV FISTULA REPAIR Right 2007   "tried to clean it out; couldn't do it"   BOTOX INJECTION N/A 05/25/2019   Procedure: BOTOX INJECTION;  Surgeon: Doran Stabler, MD;  Location: [facility] ENDOSCOPY;  Service: Gastroenterology;  Laterality: N/A;   BREATH TEK H PYLORI N/A 05/15/2014   Procedure: BREATH TEK H PYLORI;  Surgeon: Alphonsa Overall, MD;  Location: [facility] END[facility];  Service: General;  Laterality: N/A;   CHOLECYSTECTOMY N/A 11/23/2016   Procedure: LAPAROSCOPIC CHOLECYSTECTOMY WITH INTRAOPERATIVE CHOLANGIOGRAM;  Surgeon: Erroll Luna, MD;  Location: [facility];  Service: General;  Laterality: N/A;   ESOPHAGOGASTRODUODENOSCOPY N/A 12/07/2014   Procedure: ESOPHAGOGASTRODUODENOSCOPY (EGD);  Surgeon: Alphonsa Overall, MD;  Location: [facility] ENDOSCOPY;  Service: General;  Laterality: N/A;   ESOPHAGOGASTRODUODENOSCOPY N/A 12/17/2014   Procedure: ESOPHAGOGASTRODUODENOSCOPY (EGD);  Surgeon: Alphonsa Overall, MD;  Location: [place];  Service: General;  Laterality: N/A;   ESOPHAGOGASTRODUODENOSCOPY N/A 08/16/2016   Procedure: ESOPHAGOGASTRODUODENOSCOPY (EGD);  Surgeon: Jerene Bears, MD;  Location: [place];  Service: Gastroenterology;  Laterality: N/A;   ESOPHAGOGASTRODUODENOSCOPY (EGD) WITH PROPOFOL N/A 07/05/2017   Procedure: ESOPHAGOGASTRODUODENOSCOPY (EGD) WITH PROPOFOL;  Surgeon: Milus Banister, MD;  Location: [facility] ENDOSCOPY;  Service: Endoscopy;  Laterality: N/A;   ESOPHAGOGASTRODUODENOSCOPY (EGD) WITH PROPOFOL N/A 05/25/2019   Procedure: ESOPHAGOGASTRODUODENOSCOPY (EGD) WITH PROPOFOL;  Surgeon: Doran Stabler, MD;  Location: [facility] ENDOSCOPY;  Service: Gastroenterology;  Laterality: N/A;   HERNIA  REPAIR  11/2014   w/lap band OR   JOINT REPLACEMENT     KNEE ARTHROSCOPY Left 2007   LAPAROSCOPIC GASTRIC BANDING N/A 11/12/2014   Procedure: LAPAROSCOPIC GASTRIC BANDING;  Surgeon: Alphonsa Overall, MD;  Location: [facility] ORS;  Service: General;  Laterality: N/A;   PARATHYROIDECTOMY N/A 03/30/2013   Procedure: TOTAL PARATHYROIDECTOMY WITH AUTOTRANSPLANT;  Surgeon: Earnstine Regal, MD;  Location: [place];  Service: General;  Laterality: N/A;  Autotransplant to left lower arm.   TOTAL KNEE ARTHROPLASTY  Right 03/27/2014   Procedure: UNICOMPARTMENTAL ARTHROPLASTY;  Surgeon: Meredith Pel, MD;  Location: [place];  Service: Orthopedics;  Laterality: Right;    Current Medications: Current Meds  Medication Sig   albuterol (PROVENTIL HFA;VENTOLIN HFA) 108 (90 Base) MCG/ACT inhaler Inhale 2 puffs into the lungs every 4 (four) hours as needed for wheezing or shortness of breath.   b complex-vitamin c-folic acid (NEPHRO-VITE) 0.8 MG TABS tablet Take 1 tablet by mouth daily.    calcium carbonate (TUMS EX) 750 MG chewable tablet Chew 4 tablets by mouth 3 (three) times daily.   famotidine (PEPCID) 20 MG tablet Take 1 tablet (20 mg total) by mouth daily.   meloxicam (MOBIC) 15 MG tablet Take 0.5-1 tablets (7.5-15 mg total) by mouth daily as needed for pain.   metoCLOPramide (REGLAN) 5 MG tablet Take 1 tablet (5 mg total) by mouth 2 (two) times daily as needed for nausea.   pantoprazole (PROTONIX) 40 MG tablet Take 1 tablet (40 mg total) by mouth daily. Take 30 minutes before breakfast. (Patient taking differently: Take 40 mg by mouth daily as needed (acid refux). Take 30 minutes before breakfast.)   sevelamer carbonate (RENVELA) 2.4 G PACK Take 2.4 g by mouth 3 (three) times daily with meals.   tiZANidine (ZANAFLEX) 4 MG tablet Take 4 mg by mouth every 8 (eight) hours as needed for muscle spasms.    traMADol (ULTRAM) 50 MG tablet TAKE 1 TABLET(50 MG) BY MOUTH EVERY 12 HOURS AS NEEDED    Allergies:   Dilaudid [hydromorphone hcl], Amlodipine, Ascorbate, and Midodrine   Social History   Socioeconomic History   Marital status: Single    Spouse name: Not on file   Number of children: 0   Years of education: Not on file   Highest education level: Not on file  Occupational History   Not on file  Tobacco Use   Smoking status: Former    Packs/day: 0.10    Years: 16.00    Pack years: 1.60    Types: Cigarettes    Quit date: 01/17/2019    Years since quitting: 2.3   Smokeless tobacco: Never  Vaping  Use   Vaping Use: Never used  Substance and Sexual Activity   Alcohol use: No    Alcohol/week: 0.0 standard drinks   Drug use: No   Sexual activity: Yes  Other Topics Concern   Not on file  Social History Narrative   Not on file   Social Determinants of Health   Financial Resource Strain: Not on file  Food Insecurity: Not on file  Transportation Needs: Not on file  Physical Activity: Not on file  Stress: Not on file  Social Connections: Not on file     Family History:  The patient's family history includes Diabetes in his father and maternal grandmother; Hypertension in his mother; Kidney Stones in his sister; Liver cancer in his maternal uncle.   ROS:   Please see the history of present illness.  ROS All other systems reviewed and are negative.  No flowsheet data found.   PHYSICAL EXAM:   VS:  BP 100/64 (BP Location: Right Arm, Patient Position: Sitting, Cuff Size: Normal) Comment (Cuff Size): lower arm  Pulse 83   Ht 5\' 6"  (1.676 m)   Wt (!) 320 lb 9.6 oz (145.4 kg)   SpO2 98%   BMI 51.75 kg/m    GEN: Well nourished, well developed, in no acute distress  HEENT: normal  Neck: no JVD, carotid bruits, or masses Cardiac: RRR; no murmurs, rubs, or gallops,no edema.  Intact distal pulses bilaterally.  Respiratory:  clear to auscultation bilaterally, normal work of breathing GI: soft, nontender, nondistended, + BS MS: no deformity or atrophy  Skin: warm and dry, no rash Neuro:  Alert and Oriented x 3, Strength and sensation are intact Psych: euthymic mood, full affect  Wt Readings from Last 3 Encounters:  06/05/21 (!) 320 lb 9.6 oz (145.4 kg)  11/12/20 (!) 315 lb (142.9 kg)  07/30/19 (!) 310 lb (140.6 kg)      Studies/Labs Reviewed:   EKG:  EKG is ordered today.  The ekg ordered today demonstrates SR  Recent Labs: 11/12/2020: ALT 16; BUN 85; Creatinine, Ser 13.45; Hemoglobin 13.1; Platelets 228; Potassium 4.2; Sodium 134   Lipid Panel No results found for:  CHOL, TRIG, HDL, CHOLHDL, VLDL, LDLCALC, LDLDIRECT  Additional studies/ records that were reviewed today include:  Office visit notes from PCP    ASSESSMENT:    1. Palpitations   2. Essential hypertension      PLAN:  In order of problems listed above:  Palpitations -by description these sound like PVCs or PACs. -I will get a 30 day event monitor since they are so sporadic to evaluate further  2.  Hypertension -BP is adequately controlled on exam -He is currently not on antihypertensive therapy  Time Spent: 20 minutes total time of encounter, including 15 minutes spent in face-to-face patient care on the date of this encounter. This time includes coordination of care and counseling regarding above mentioned problem list. Remainder of non-face-to-face time involved reviewing chart documents/testing relevant to the patient encounter and documentation in the medical record. I have independently reviewed documentation from referring provider  Medication Adjustments/Labs and Tests Ordered: Current medicines are reviewed at length with the patient today.  Concerns regarding medicines are outlined above.  Medication changes, Labs and Tests ordered today are listed in the Patient Instructions below.  There are no Patient Instructions on file for this visit.   Signed, Fransico Him, MD  06/05/2021 9:20 AM    [facility] [address], [place], [place]  [address] Phone: [phone]; Fax: [phone]

## 2021-06-05 ENCOUNTER — Encounter: Payer: Self-pay | Admitting: *Deleted

## 2021-06-05 ENCOUNTER — Other Ambulatory Visit: Payer: Self-pay

## 2021-06-05 ENCOUNTER — Encounter: Payer: Self-pay | Admitting: Cardiology

## 2021-06-05 ENCOUNTER — Telehealth: Payer: Self-pay | Admitting: *Deleted

## 2021-06-05 ENCOUNTER — Ambulatory Visit (INDEPENDENT_AMBULATORY_CARE_PROVIDER_SITE_OTHER): Payer: Medicare Other | Admitting: Cardiology

## 2021-06-05 VITALS — BP 100/64 | HR 83 | Ht 66.0 in | Wt 320.6 lb

## 2021-06-05 DIAGNOSIS — R002 Palpitations: Secondary | ICD-10-CM | POA: Diagnosis not present

## 2021-06-05 DIAGNOSIS — I10 Essential (primary) hypertension: Secondary | ICD-10-CM

## 2021-06-05 NOTE — Telephone Encounter (Signed)
-----   Message from Antonieta Iba, RN sent at 06/05/2021 12:23 PM EDT ----- Regarding: Jaynie Crumble Itamar sleep study has been ordered.  Thanks!

## 2021-06-05 NOTE — Patient Instructions (Signed)
Medication Instructions:  Your physician recommends that you continue on your current medications as directed. Please refer to the Current Medication list given to you today.  *If you need a refill on your cardiac medications before your next appointment, please call your pharmacy*  Testing/Procedures: Your physician has recommended that you have a sleep study. This test records several body functions during sleep, including: brain activity, eye movement, oxygen and carbon dioxide blood levels, heart rate and rhythm, breathing rate and rhythm, the flow of air through your mouth and nose, snoring, body muscle movements, and chest and belly movement.  Your physician has recommended that you wear an event monitor. Event monitors are medical devices that record the heart's electrical activity. Doctors most often [place] these monitors to diagnose arrhythmias. Arrhythmias are problems with the speed or rhythm of the heartbeat. The monitor is a small, portable device. You can wear one while you do your normal daily activities. This is usually used to diagnose what is causing palpitations/syncope (passing out).   Follow-Up: At [facility], you and your health needs are our priority.  As part of our continuing mission to provide you with exceptional heart care, we have created designated Provider Care Teams.  These Care Teams include your primary Cardiologist (physician) and Advanced Practice Providers (APPs -  Physician Assistants and Nurse Practitioners) who all work together to provide you with the care you need, when you need it.  Follow up with Dr. Radford Pax as needed based on results of testing.   Other Instructions: [employer] Cardiac Event Monitor Instructions Your physician has requested you wear your cardiac event monitor for 30 days. [employer] may call or text to confirm a shipping address. The monitor will be sent to a land address via UPS. [employer] will not ship a monitor to a PO BOX. It  typically takes 3-5 days to receive your monitor after it has been enrolled. [employer] will assist with USPS tracking if your package is delayed. The telephone number for [employer] is [phone]. Once you have received your monitor, please review the enclosed instructions. Instruction tutorials can also be viewed under help and settings on the enclosed cell phone. Your monitor has already been registered assigning a specific monitor serial # to you.  Applying the monitor Remove cell phone from case and turn it on. The cell phone works as Dealer and needs to be within [employer] of you at all times. The cell phone will need to be charged on a daily basis. We recommend you plug the cell phone into the enclosed charger at your bedside table every night.  Monitor batteries: You will receive two monitor batteries labelled #1 and #2. These are your recorders. Plug battery #2 onto the second connection on the enclosed charger. Keep one battery on the charger at all times. This will keep the monitor battery deactivated. It will also keep it fully charged for when you need to switch your monitor batteries. A small light will be blinking on the battery emblem when it is charging. The light on the battery emblem will remain on when the battery is fully charged.  Open package of a Monitor strip. Insert battery #1 into black hood on strip and gently squeeze monitor battery onto connection as indicated in instruction booklet. Set aside while preparing skin.  Choose location for your strip, vertical or horizontal, as indicated in the instruction booklet. Shave to remove all hair from location. There cannot be any lotions, oils, powders, or colognes on skin where  monitor is to be applied. Wipe skin clean with enclosed Saline wipe. Dry skin completely.  Peel paper labeled #1 off the back of the Monitor strip exposing the adhesive. Place the monitor on the chest in the vertical or horizontal  position shown in the instruction booklet. One arrow on the monitor strip must be pointing upward. Carefully remove paper labeled #2, attaching remainder of strip to your skin. Try not to create any folds or wrinkles in the strip as you apply it.  Firmly press and release the circle in the center of the monitor battery. You will hear a small beep. This is turning the monitor battery on. The heart emblem on the monitor battery will light up every 5 seconds if the monitor battery in turned on and connected to the patient securely. Do not push and hold the circle down as this turns the monitor battery off. The cell phone will locate the monitor battery. A screen will appear on the cell phone checking the connection of your monitor strip. This may read poor connection initially but change to good connection within the next minute. Once your monitor accepts the connection you will hear a series of 3 beeps followed by a climbing crescendo of beeps. A screen will appear on the cell phone showing the two monitor strip placement options. Touch the picture that demonstrates where you applied the monitor strip.  Your monitor strip and battery are waterproof. You are able to shower, bathe, or swim with the monitor on. They just ask you do not submerge deeper than 3 feet underwater. We recommend removing the monitor if you are swimming in a lake, river, or ocean.  Your monitor battery will need to be switched to a fully charged monitor battery approximately once a week. The cell phone will alert you of an action which needs to be made.  On the cell phone, tap for details to reveal connection status, monitor battery status, and cell phone battery status. The green dots indicates your monitor is in good status. A red dot indicates there is something that needs your attention.  To record a symptom, click the circle on the monitor battery. In 30-60 seconds a list of symptoms will appear on the cell phone.  Select your symptom and tap save. Your monitor will record a sustained or significant arrhythmia regardless of you clicking the button. Some patients do not feel the heart rhythm irregularities. [employer] will notify us of any serious or critical events.  Refer to instruction booklet for instructions on switching batteries, changing strips, the Do not disturb or Pause features, or any additional questions.  Call [employer] at [phone], to confirm your monitor is transmitting and record your baseline. They will answer any questions you may have regarding the monitor instructions at that time.  Returning the monitor to [place] all equipment back into blue box. Peel off strip of paper to expose adhesive and close box securely. There is a prepaid UPS shipping label on this box. Drop in a UPS drop box, or at a UPS facility like [employer]. You may also contact [employer] to arrange UPS to pick up monitor package at your home.

## 2021-06-05 NOTE — Progress Notes (Signed)
Patient ID: [name], male   DOB: 1986/05/07, 35 y.o.   MRN: [id number] Patient enrolled for [employer] to ship a 30 day cardiac event monitor to his home.

## 2021-06-05 NOTE — Telephone Encounter (Signed)
Per medicare no PA required. Ok to do the study.

## 2021-06-05 NOTE — Addendum Note (Signed)
Addended by: Antonieta Iba on: 06/05/2021 09:39 AM   Modules accepted: Orders

## 2021-06-06 NOTE — Telephone Encounter (Signed)
-----   Message from Freada Bergeron, [place] sent at 06/05/2021  2:31 PM EDT ----- Per medicare no PA required. Ok to do the study.

## 2021-06-06 NOTE — Telephone Encounter (Signed)
I s/w the pt this morning and he has been made aware ok to proceed with Itamar Sleep study. PIN# [id number] was given to the pt. Pt aware once study is completed and read by the cardiologist, we will call him with the results and any recommendations. Pt thanked me for the call and the help. Pt states he will do the sleep study tonight.

## 2021-06-18 ENCOUNTER — Telehealth: Payer: Self-pay

## 2021-06-18 NOTE — Telephone Encounter (Signed)
-----   Message from Sueanne Margarita, MD sent at 06/16/2021  9:25 AM EST ----- Itamar sleep study nondiagnostic due to lack of sleep and needs to be repeated

## 2021-06-18 NOTE — Telephone Encounter (Signed)
Left message for patient to call back  

## 2021-06-23 NOTE — Telephone Encounter (Signed)
Attempted to call patient. Unable to leave voicemail.  

## 2021-07-02 NOTE — Telephone Encounter (Signed)
Spoke with the patient and advised him that his sleep study will need to be repeated. Patient states that he can come by tomorrow to pick it up.

## 2021-07-03 NOTE — Telephone Encounter (Signed)
Pt stopped by the office today and picked up his repeat Itamar box. I will register serial number [id number]. Pt said he will do sleep study either tonight or night this week. Pt thanked me for the help.

## 2021-07-16 ENCOUNTER — Encounter (HOSPITAL_BASED_OUTPATIENT_CLINIC_OR_DEPARTMENT_OTHER): Payer: Medicare Other | Admitting: Cardiology

## 2021-07-16 ENCOUNTER — Ambulatory Visit: Payer: Medicare Other

## 2021-07-16 DIAGNOSIS — R002 Palpitations: Secondary | ICD-10-CM

## 2021-07-16 DIAGNOSIS — G4733 Obstructive sleep apnea (adult) (pediatric): Secondary | ICD-10-CM

## 2021-07-16 DIAGNOSIS — I10 Essential (primary) hypertension: Secondary | ICD-10-CM

## 2021-07-16 NOTE — Procedures (Signed)
° °  Sleep Study Report  Patient Information Study Date: 07/15/21 Patient Name: [name] Patient ID: [id number] Birth Date: Aug 07, 1985 Age: 35 Gender: Male BMI: 51.7 (W=322 lb, H=5' 6'') Neck Circ.: [place]: Referring Physician: Fransico Him, MD  TEST DESCRIPTION: Home sleep apnea testing was completed using the WatchPat, a Type 1 device, utilizing peripheral arterial tonometry (PAT), chest movement, actigraphy, pulse oximetry, pulse rate, body position and snore. AHI was calculated with apnea and hypopnea using valid sleep time as the denominator. RDI includes apneas, hypopneas, and RERAs. The data acquired and the scoring of sleep and all associated events were performed in accordance with the recommended standards and specifications as outlined in the AASM Manual for the Scoring of Sleep and Associated Events 2.2.0 (2015).  FINDINGS: 1. Mild Obstructive Sleep Apnea with AHI 11.6/hr. 2. No Central Sleep Apnea with pAHIc 0.2/hr. 3. Oxygen desaturations as low as 87%. 4. Severe snoring was present. O2 sats were < 88% for 0.2 min. 5. Total sleep time was 6 hrs and 43 min. 6. 28.5% of total sleep time was spent in REM sleep. 7. Normal sleep onset latency at 27min. 8. Shortened REM sleep onset latency at 67 min. 9. Total awakenings were 12.  DIAGNOSIS: Mild Obstructive Sleep Apnea (G47.33)  RECOMMENDATIONS: 1. Clinical correlation of these findings is necessary. The decision to treat obstructive sleep apnea (OSA) is usually based on the presence of apnea symptoms or the presence of associated medical conditions such as Hypertension, Congestive Heart Failure, Atrial Fibrillation or Obesity. The most common symptoms of OSA are snoring, gasping for breath while sleeping, daytime sleepiness and fatigue.  2. Initiating apnea therapy is recommended given the presence of symptoms and/or associated conditions. Recommend proceeding with one of the following:   a. Auto-CPAP therapy  with a pressure range of 5-20cm H2O.   b. An oral appliance (OA) that can be obtained from certain dentists with expertise in sleep medicine. These are primarily of use in non-obese patients with mild and moderate disease.   c. An ENT consultation which may be useful to look for specific causes of obstruction and possible treatment options.   d. If patient is intolerant to PAP therapy, consider referral to ENT for evaluation for hypoglossal nerve stimulator.  3. Close follow-up is necessary to ensure success with CPAP or oral appliance therapy for maximum benefit .  4. A follow-up oximetry study on CPAP is recommended to assess the adequacy of therapy and determine the need for supplemental oxygen or the potential need for Bi-level therapy. An arterial blood gas to determine the adequacy of baseline ventilation and oxygenation should also be considered.  5. Healthy sleep recommendations include: adequate nightly sleep (normal 7-9 hrs/night), avoidance of caffeine after noon and alcohol near bedtime, and maintaining a sleep environment that is cool, dark and quiet.  6. Weight loss for overweight patients is recommended. Even modest amounts of weight loss can significantly improve the severity of sleep apnea.  7. Snoring recommendations include: weight loss where appropriate, side sleeping, and avoidance of alcohol before bed.  8. Operation of motor vehicle should be avoided when sleepy.  Signature: Electronically Signed: 07/16/21 Fransico Him, MD; [facility]; [place], [place] Board of Sleep Medicine

## 2021-07-24 ENCOUNTER — Telehealth: Payer: Self-pay

## 2021-07-24 NOTE — Telephone Encounter (Signed)
Pt is returning call for sleep study results

## 2021-07-24 NOTE — Telephone Encounter (Signed)
Pt states that sometimes he gets sleepy during the days he is off work. He states that he is not sure if he snores but on occasion he finds that he wakes up gasping for air.

## 2021-07-24 NOTE — Telephone Encounter (Signed)
Left message for patient to call back for sleep study results.  

## 2021-07-24 NOTE — Telephone Encounter (Signed)
-----   Message from Cleon Gustin, [place] sent at 07/23/2021 10:13 AM EST -----  ----- Message ----- From: Sueanne Margarita, MD Sent: 07/16/2021   7:29 AM EST To: Freada Bergeron, CMA  Please let patient know that they have sleep apnea but is mild.  Please find out if patient has any problems with snoring, sleepy during the day, fatigue, awakening gasping for breath

## 2021-07-30 NOTE — Addendum Note (Signed)
Addended by: Freada Bergeron on: 07/30/2021 06:19 PM   Modules accepted: Orders

## 2021-07-30 NOTE — Telephone Encounter (Signed)
Order placed to [facility].  Upon patient request DME selection is [facility] Patient understands he will be contacted by [facility] to set up his cpap. Patient understands to call if [facility] does not contact him with new setup in a timely manner. Patient understands they will be called once confirmation has been received from [employer]/ that they have received their new machine to schedule 10 week follow up appointment.   [facility] notified of new cpap order  Please add to airview Patient was grateful for the call and thanked me

## 2021-08-14 ENCOUNTER — Telehealth: Payer: Self-pay | Admitting: *Deleted

## 2021-08-14 NOTE — Telephone Encounter (Signed)
-----   Message from Sueanne Margarita, MD sent at 07/16/2021  7:29 AM EST ----- Please let patient know that they have sleep apnea but is mild.  Please find out if patient has any problems with snoring, sleepy during the day, fatigue, awakening gasping for breath

## 2021-08-14 NOTE — Telephone Encounter (Signed)
The patient has been notified of the result and verbalized understanding. Left detailed message on voicemail and informed patient to call back.  Marolyn Hammock, [place] 08/14/2021 1:59 PM

## 2021-08-21 ENCOUNTER — Telehealth: Payer: Self-pay | Admitting: *Deleted

## 2021-08-21 NOTE — Telephone Encounter (Deleted)
Good morning dr Radford Pax on your 06-05-21 note for this pt. snoring was not mentioned as a sleep issue.  Per [employer] requires a sleep issue to be mentioned within the ov note prior to the sleep study. Would you consider addending your 06-05-21 note to add snoring, this is already mentioned on the sleep study. Everything thing else is within the note.

## 2021-10-24 NOTE — Telephone Encounter (Signed)
Yes, he has snoring and fatigue. ?

## 2021-10-27 NOTE — Progress Notes (Signed)
? ?Cardiology Office Note:   ? ?Date:  10/27/2021  ? ?ID:  [name], DOB [birth year], MRN [id number] ? ?PCP:  Nolene Ebbs, MD ?  ?[facility] Providers ?Cardiologist:  Fransico Him, MD  ?   ? ?Referring MD: Nolene Ebbs, MD  ? ?Follow-up for hypertension and palpitations. ? ?History of Present Illness:   ? ?[name] is a 36 y.o. male with a hx of chronic lower back pain, dizziness, end-stage renal disease on dialysis Monday Wednesday Friday, GERD, palpitations, and hypertension. ? ?He was seen by Dr. Radford Pax on 06/05/2021.  He had been referred by his PCP for further evaluation of his palpitations.  He reported having palpitations since 2017.  He became concerned.  He denied heartbeat skipping sensation.  He described his episodes as very sporadic but occurring once weekly.  He denied chest pain, shortness of breath, dyspnea, PND, orthopnea, lower extremity swelling, and syncope.  It was felt that his palpitations were likely PVCs and PACs.  A 30-day cardiac event monitor was ordered. ? ?He presents to the clinic today for follow-up evaluation states he has not had any further episodes of palpitations.  He reports compliance with his CPAP.  He does note that the pressure is very high.  He reports that his cardiac event monitor came to his house but he did not know how to apply it and sent the device back.    He is limited in his physical activity due to his left knee pain and weight.  He plans to follow-up with orthopedics to discuss options for treatment for his left knee.  I have asked him to contact the CPAP supplier for pressure titration.  He will have evaluation for transplant through [facility] and reports that they will be discussing diet and weight loss options.  He previously had gastric banding which was not successful.  He was unable to tolerate the banding and had a removed.  We will plan follow-up for 6 months. ? ?Today he denies chest pain, shortness of breath, lower extremity edema, fatigue,  palpitations, melena, hematuria, hemoptysis, diaphoresis, weakness, presyncope, syncope, orthopnea, and PND. ? ? ?Past Medical History:  ?Diagnosis Date  ? Arthritis   ? "all over" (11/19/2016)  ? Chronic lower back pain   ? Complication of anesthesia   ? A little while to wake up after knee surgery in 2008  ? Dizziness   ? when coming off of dialysis  ? ESRD (end stage renal disease) on dialysis [facility])   ? MWF and goes to [facility] (11/19/2016)  ? Family history of anesthesia complication   ? mom slow to wake up  ? GERD (gastroesophageal reflux disease)   ? takes Omeprazole as needed  ? Headache(784.0)   ? "monthly" (11/19/2016)  ? History of hiatal hernia   ? Hyperparathyroidism ([place])   ? Hypertension   ? Joint pain   ? Joint swelling   ? Morbid obesity ([place])   ? PONV (postoperative nausea and vomiting)   ? Renal insufficiency   ? Pt is on dyalisis and has been for 14 years  ? Thyroid disease   ? ? ?Past Surgical History:  ?Procedure Laterality Date  ? AV FISTULA PLACEMENT Bilateral 2005,2007  ? "right; left"  ? AV FISTULA REPAIR Right 2007  ? "tried to clean it out; couldn't do it"  ? BOTOX INJECTION N/A 05/25/2019  ? Procedure: BOTOX INJECTION;  Surgeon: Doran Stabler, MD;  Location: [facility];  Service: Gastroenterology;  Laterality: N/A;  ? BREATH TEK H PYLORI N/A 05/15/2014  ? Procedure: BREATH TEK H PYLORI;  Surgeon: Alphonsa Overall, MD;  Location: [facility];  Service: General;  Laterality: N/A;  ? CHOLECYSTECTOMY N/A 11/23/2016  ? Procedure: LAPAROSCOPIC CHOLECYSTECTOMY WITH INTRAOPERATIVE CHOLANGIOGRAM;  Surgeon: Erroll Luna, MD;  Location: [place];  Service: General;  Laterality: N/A;  ? ESOPHAGOGASTRODUODENOSCOPY N/A 12/07/2014  ? Procedure: ESOPHAGOGASTRODUODENOSCOPY (EGD);  Surgeon: Alphonsa Overall, MD;  Location: [place];  Service: General;  Laterality: N/A;  ? ESOPHAGOGASTRODUODENOSCOPY N/A 12/17/2014  ? Procedure: ESOPHAGOGASTRODUODENOSCOPY (EGD);  Surgeon: Alphonsa Overall, MD;  Location: [place];   Service: General;  Laterality: N/A;  ? ESOPHAGOGASTRODUODENOSCOPY N/A 08/16/2016  ? Procedure: ESOPHAGOGASTRODUODENOSCOPY (EGD);  Surgeon: Jerene Bears, MD;  Location: [place];  Service: Gastroenterology;  Laterality: N/A;  ? ESOPHAGOGASTRODUODENOSCOPY (EGD) WITH PROPOFOL N/A 07/05/2017  ? Procedure: ESOPHAGOGASTRODUODENOSCOPY (EGD) WITH PROPOFOL;  Surgeon: Milus Banister, MD;  Location: [facility];  Service: Endoscopy;  Laterality: N/A;  ? ESOPHAGOGASTRODUODENOSCOPY (EGD) WITH PROPOFOL N/A 05/25/2019  ? Procedure: ESOPHAGOGASTRODUODENOSCOPY (EGD) WITH PROPOFOL;  Surgeon: Doran Stabler, MD;  Location: [facility];  Service: Gastroenterology;  Laterality: N/A;  ? HERNIA REPAIR  11/2014  ? w/lap band OR  ? JOINT REPLACEMENT    ? KNEE ARTHROSCOPY Left 2007  ? LAPAROSCOPIC GASTRIC BANDING N/A 11/12/2014  ? Procedure: LAPAROSCOPIC GASTRIC BANDING;  Surgeon: Alphonsa Overall, MD;  Location: [facility];  Service: General;  Laterality: N/A;  ? PARATHYROIDECTOMY N/A 03/30/2013  ? Procedure: TOTAL PARATHYROIDECTOMY WITH AUTOTRANSPLANT;  Surgeon: Earnstine Regal, MD;  Location: [place];  Service: General;  Laterality: N/A;  Autotransplant to left lower arm.  ? TOTAL KNEE ARTHROPLASTY Right 03/27/2014  ? Procedure: UNICOMPARTMENTAL ARTHROPLASTY;  Surgeon: Meredith Pel, MD;  Location: [place];  Service: Orthopedics;  Laterality: Right;  ? ? ?Current Medications: ?No outpatient medications have been marked as taking for the 10/29/21 encounter (Appointment) with Deberah Pelton, NP.  ?  ? ?Allergies:   Dilaudid [hydromorphone hcl], Amlodipine, Ascorbate, and Midodrine  ? ?Social History  ? ?Socioeconomic History  ? Marital status: Single  ?  Spouse name: Not on file  ? Number of children: 0  ? Years of education: Not on file  ? Highest education level: Not on file  ?Occupational History  ? Not on file  ?Tobacco Use  ? Smoking status: Former  ?  Packs/day: 0.10  ?  Years: 16.00  ?  Pack years: 1.60  ?  Types: Cigarettes  ?  Quit  date: 01/17/2019  ?  Years since quitting: 2.7  ? Smokeless tobacco: Never  ?Vaping Use  ? Vaping Use: Never used  ?Substance and Sexual Activity  ? Alcohol use: No  ?  Alcohol/week: 0.0 standard drinks  ? Drug use: No  ? Sexual activity: Yes  ?Other Topics Concern  ? Not on file  ?Social History Narrative  ? Not on file  ? ?Social Determinants of Health  ? ?Financial Resource Strain: Not on file  ?Food Insecurity: Not on file  ?Transportation Needs: Not on file  ?Physical Activity: Not on file  ?Stress: Not on file  ?Social Connections: Not on file  ?  ? ?Family History: ?The patient's family history includes Diabetes in his father and maternal grandmother; Hypertension in his mother; Kidney Stones in his sister; Liver cancer in his maternal uncle. There is no history of Colon cancer, Colon polyps, Esophageal cancer, Rectal cancer, or Stomach cancer. ? ?ROS:   ?Please  see the history of present illness.    ? All other systems reviewed and are negative. ? ? ?Risk Assessment/Calculations:   ?  ? ?STOP-Bang Score:  4      ? ?Physical Exam:   ? ?VS:  There were no vitals taken for this visit.   ? ?Wt Readings from Last 3 Encounters:  ?06/05/21 (!) 320 lb 9.6 oz (145.4 kg)  ?11/12/20 (!) 315 lb (142.9 kg)  ?07/30/19 (!) 310 lb (140.6 kg)  ?  ? ?GEN:  Well nourished, well developed in no acute distress ?HEENT: Normal ?NECK: No JVD; No carotid bruits ?LYMPHATICS: No lymphadenopathy ?CARDIAC: RRR, no murmurs, rubs, gallops ?RESPIRATORY:  Clear to auscultation without rales, wheezing or rhonchi  ?ABDOMEN: Soft, non-tender, non-distended ?MUSCULOSKELETAL:  No edema; No deformity  ?SKIN: Warm and dry ?NEUROLOGIC:  Alert and oriented x 3 ?PSYCHIATRIC:  Normal affect  ? ? ?EKGs/Labs/Other Studies Reviewed:   ? ?The following studies were reviewed today: ? ?EKG 06/05/2021 ?Normal sinus rhythm 82 bpm no ST or T wave deviation. ? ?EKG: None today. ? ?Recent Labs: ?11/12/2020: ALT 16; BUN 85; Creatinine, Ser 13.45; Hemoglobin 13.1;  Platelets 228; Potassium 4.2; Sodium 134  ?Recent Lipid Panel ?No results found for: CHOL, TRIG, HDL, CHOLHDL, VLDL, LDLCALC, LDLDIRECT ? ?ASSESSMENT & PLAN  ? ? ?Palpitations-denies further episodes

## 2021-10-29 ENCOUNTER — Encounter (HOSPITAL_BASED_OUTPATIENT_CLINIC_OR_DEPARTMENT_OTHER): Payer: Self-pay | Admitting: General Practice

## 2021-10-29 ENCOUNTER — Ambulatory Visit (INDEPENDENT_AMBULATORY_CARE_PROVIDER_SITE_OTHER): Payer: Medicare Other | Admitting: General Practice

## 2021-10-29 ENCOUNTER — Other Ambulatory Visit: Payer: Self-pay

## 2021-10-29 VITALS — BP 112/60 | HR 87 | Ht 66.0 in | Wt 326.0 lb

## 2021-10-29 DIAGNOSIS — I10 Essential (primary) hypertension: Secondary | ICD-10-CM

## 2021-10-29 DIAGNOSIS — R002 Palpitations: Secondary | ICD-10-CM

## 2021-10-29 DIAGNOSIS — G4733 Obstructive sleep apnea (adult) (pediatric): Secondary | ICD-10-CM

## 2021-10-29 NOTE — Patient Instructions (Signed)
Medication Instructions:  ?Your Physician recommend you continue on your current medication as directed.   ? ?*If you need a refill on your cardiac medications before your next appointment, please call your pharmacy* ? ?Follow-Up: ?At [facility], you and your health needs are our priority.  As part of our continuing mission to provide you with exceptional heart care, we have created designated Provider Care Teams.  These Care Teams include your primary Cardiologist (physician) and Advanced Practice Providers (APPs -  Physician Assistants and Nurse Practitioners) who all work together to provide you with the care you need, when you need it. ? ?We recommend signing up for the patient portal called "MyChart".  Sign up information is provided on this After Visit Summary.  MyChart is used to connect with patients for Virtual Visits (Telemedicine).  Patients are able to view lab/test results, encounter notes, upcoming appointments, etc.  Non-urgent messages can be sent to your provider as well.   ?To learn more about what you can do with MyChart, go to [email or web address].   ? ?Your next appointment:   ?6 month(s) ? ?The format for your next appointment:   ?In Person ? ?Provider:   ?Fransico Him, MD { ? ?Other Instructions ?Please call your CPAP supplier to inquire about your pressure setting and adjustments.  ? ?To prevent palpitations: ?Make sure you are adequately hydrated.  ?Avoid and/or limit caffeine containing beverages like soda or tea. ?Exercise regularly.  ?Manage stress well. ?Some over the counter medications can cause palpitations such as Benadryl, AdvilPM, TylenolPM. Regular Advil or Tylenol do not cause palpitations.  ? ?Please monitor your Blood pressure 2-3 times per month, unless feeling bad and then can take additional times.  ? ?Tips to Measure your Blood Pressure Correctly ? ?To determine whether you have hypertension, a medical professional will take a blood pressure reading. How you prepare  for the test, the position of your arm, and other factors can change a blood pressure reading by 10% or more. That could be enough to hide high blood pressure, start you on a drug you don't really need, or lead your doctor to incorrectly adjust your medications. ? ?National and international guidelines offer specific instructions for measuring blood pressure. If a doctor, nurse, or medical assistant isn't doing it right, don't hesitate to ask him or her to get with the guidelines. ? ?Here's what you can do to ensure a correct reading: ? Don't drink a caffeinated beverage or smoke during the 30 minutes before the test. ? Sit quietly for five minutes before the test begins. ? During the measurement, sit in a chair with your feet on the floor and your arm supported so your elbow is at about heart level. ? The inflatable part of the cuff should completely cover at least 80% of your upper arm, and the cuff should be placed on bare skin, not over a shirt. ? Don't talk during the measurement. ? Have your blood pressure measured twice, with a brief break in between. If the readings are different by 5 points or more, have it done a third time. ? ?In 2017, new guidelines from the [place], the [employer] of Cardiology, and nine other health organizations lowered the diagnosis of high blood pressure to 130/80 mm Hg or higher for all adults. The guidelines also redefined the various blood pressure categories to now include normal, elevated, Stage 1 hypertension, Stage 2 hypertension, and hypertensive crisis (see "Blood pressure categories"). ? ?Blood pressure categories  ?Blood  pressure category SYSTOLIC ?(upper number)  DIASTOLIC ?(lower number)  ?Normal Less than 120 mm Hg and Less than 80 mm Hg  ?Elevated 120-129 mm Hg and Less than 80 mm Hg  ?High blood pressure: Stage 1 hypertension 130-139 mm Hg or 80-89 mm Hg  ?High blood pressure: Stage 2 hypertension 140 mm Hg or higher or 90 mm Hg or higher   ?Hypertensive crisis (consult your doctor immediately) Higher than 180 mm Hg and/or Higher than 120 mm Hg  ?Source: American Heart Association and American Stroke Association. ?For more on getting your blood pressure under control, buy Controlling Your Blood Pressure, a Special Health Report from [facility]. ? ? ?Blood Pressure Log ? ? ?Date ?  ?Time  ?Blood Pressure  ?Position  ?Example: Nov 1 9 AM 124/78 sitting  ? ?     ? ?     ? ?     ? ?     ? ?     ? ?     ? ?     ? ?     ? ? ?Calorie Counting for Weight Loss ?Calories are units of energy. Your body needs a certain number of calories from food to keep going throughout the day. When you eat or drink more calories than your body needs, your body stores the extra calories mostly as fat. When you eat or drink fewer calories than your body needs, your body burns fat to get the energy it needs. ?Calorie counting means keeping track of how many calories you eat and drink each day. Calorie counting can be helpful if you need to lose weight. If you eat fewer calories than your body needs, you should lose weight. Ask your health care provider what a healthy weight is for you. ?For calorie counting to work, you will need to eat the right number of calories each day to lose a healthy amount of weight per week. A dietitian can help you figure out how many calories you need in a day and will suggest ways to reach your calorie goal. ?A healthy amount of weight to lose each week is usually 1-2 lb (0.5-0.9 kg). This usually means that your daily calorie intake should be reduced by 500-750 calories. ?Eating 1,200-1,500 calories a day can help most women lose weight. ?Eating 1,500-1,800 calories a day can help most men lose weight. ?What do I need to know about calorie counting? ?Work with your health care provider or dietitian to determine how many calories you should get each day. To meet your daily calorie goal, you will need to: ?Find out how many calories are  in each food that you would like to eat. Try to do this before you eat. ?Decide how much of the food you plan to eat. ?Keep a food log. Do this by writing down what you ate and how many calories it had. ?To successfully lose weight, it is important to balance calorie counting with a healthy lifestyle that includes regular activity. ?Where do I find calorie information? ?The number of calories in a food can be found on a Nutrition Facts label. If a food does not have a Nutrition Facts label, try to look up the calories online or ask your dietitian for help. ?Remember that calories are listed per serving. If you choose to have more than one serving of a food, you will have to multiply the calories per serving by the number of servings you plan to eat. For example, the  label on a package of bread might say that a serving size is 1 slice and that there are 90 calories in a serving. If you eat 1 slice, you will have eaten 90 calories. If you eat 2 slices, you will have eaten 180 calories. ?How do I keep a food log? ?After each time that you eat, record the following in your food log as soon as possible: ?What you ate. Be sure to include toppings, sauces, and other extras on the food. ?How much you ate. This can be measured in cups, ounces, or number of items. ?How many calories were in each food and drink. ?The total number of calories in the food you ate. ?Keep your food log near you, such as in a pocket-sized notebook or on an app or website on your mobile phone. Some programs will calculate calories for you and show you how many calories you have left to meet your daily goal. ?What are some portion-control tips? ?Know how many calories are in a serving. This will help you know how many servings you can have of a certain food. ?Use a measuring cup to measure serving sizes. You could also try weighing out portions on a kitchen scale. With time, you will be able to estimate serving sizes for some foods. ?Take time to put  servings of different foods on your favorite plates or in your favorite bowls and cups so you know what a serving looks like. ?Try not to eat straight from a food's packaging, such as from a bag or box.

## 2021-11-27 ENCOUNTER — Telehealth: Payer: Self-pay

## 2021-11-27 NOTE — Telephone Encounter (Signed)
IC advised unable to provide narcotics as he has not been seen in our office since 2021 ?

## 2021-11-27 NOTE — Telephone Encounter (Signed)
Patient called into the office and would like a refill on tramadol  ?

## 2021-12-01 ENCOUNTER — Ambulatory Visit (INDEPENDENT_AMBULATORY_CARE_PROVIDER_SITE_OTHER): Payer: Medicare Other | Admitting: Surgical

## 2021-12-01 ENCOUNTER — Telehealth: Payer: Self-pay

## 2021-12-01 ENCOUNTER — Ambulatory Visit (INDEPENDENT_AMBULATORY_CARE_PROVIDER_SITE_OTHER): Payer: Medicare Other

## 2021-12-01 VITALS — Ht 66.0 in | Wt 328.8 lb

## 2021-12-01 DIAGNOSIS — M25562 Pain in left knee: Secondary | ICD-10-CM

## 2021-12-01 DIAGNOSIS — M1712 Unilateral primary osteoarthritis, left knee: Secondary | ICD-10-CM

## 2021-12-01 NOTE — Telephone Encounter (Signed)
Auth needed for left knee gel injection  

## 2021-12-01 NOTE — Telephone Encounter (Signed)
Noted  

## 2021-12-07 ENCOUNTER — Encounter: Payer: Self-pay | Admitting: Orthopedic Surgery

## 2021-12-07 DIAGNOSIS — M1712 Unilateral primary osteoarthritis, left knee: Secondary | ICD-10-CM | POA: Diagnosis not present

## 2021-12-07 MED ORDER — METHYLPREDNISOLONE ACETATE 40 MG/ML IJ SUSP
40.0000 mg | INTRAMUSCULAR | Status: AC | PRN
  Administered 2021-12-07: 40 mg via INTRA_ARTICULAR

## 2021-12-07 MED ORDER — BUPIVACAINE HCL 0.25 % IJ SOLN
4.0000 mL | INTRAMUSCULAR | Status: AC | PRN
  Administered 2021-12-07: 4 mL via INTRA_ARTICULAR

## 2021-12-07 MED ORDER — LIDOCAINE HCL 1 % IJ SOLN
5.0000 mL | INTRAMUSCULAR | Status: AC | PRN
  Administered 2021-12-07: 5 mL

## 2021-12-07 NOTE — Progress Notes (Signed)
? ?Office Visit Note ?  ?Patient: [name]           ?Date of Birth: 09-11-1985           ?MRN: [id number] ?Visit Date: 12/01/2021 ?Requested by: Nolene Ebbs, MD ?[address] ?[place],  [place] [address] ?PCP: Nolene Ebbs, MD ? ?Subjective: ?Chief Complaint  ?Patient presents with  ? Left Knee - Pain  ? ? ?HPI: [name] is a 36 y.o. male who presents to the office complaining of left knee pain.  Patient states he has had worsening pain over the last year.  Occasionally feels like it wants to give out on him.  Localizes pain to the lateral aspect of the knee primarily.  Very stiff in the morning and with any sort of immobility.  Pain is worse in the morning.  Occasionally wakes.  He did have recent fall in March onto the knee.  No long-lasting increase in pain following that fall.  Has history of right knee unicompartmental knee arthroplasty that is doing fairly well for him.  He has history of end-stage renal disease and is on dialysis.  Currently pursuing weight loss for eventual kidney transplant.   ?             ?ROS: All systems reviewed are negative as they relate to the chief complaint within the history of present illness.  Patient denies fevers or chills. ? ?Assessment & Plan: ?Visit Diagnoses:  ?1. Primary osteoarthritis of left knee   ? ? ?Plan: Patient is a 36 year old male who presents for evaluation of left knee pain.  He has history of end-stage knee arthritis with worsening pain over the last year.  Most of his complaints are consistent with end-stage knee arthritis such as stiffness with immobility, pain that wakes him up at night, feelings of the knee wanting to give out on him.  Majority of his pain is in the lateral aspect of the knee where his arthritis is worst.  Has valgus deformity of the knee.  Previous injections have only really lasted for 2 to 3 days but he is at the point where he wants to try anything that will give him some relief.  Surgery is not really an option at  this time due to patient's body habitus with BMI 53.  Left knee was aspirated of 25 cc of inflammatory but nonpurulent synovial fluid.  Left knee successfully injected with cortisone.  Plan to preapproved him for left knee gel injection.  Follow-up after approval. ? ?This patient is diagnosed with osteoarthritis of the knee(s).   ? ?Radiographs show evidence of joint space narrowing, osteophytes, subchondral sclerosis and/or subchondral cysts.  This patient has knee pain which interferes with functional and activities of daily living.   ? ?This patient has experienced inadequate response, adverse effects and/or intolerance with conservative treatments such as acetaminophen, NSAIDS, topical creams, physical therapy or regular exercise, knee bracing and/or weight loss.  ? ?This patient has experienced inadequate response or has a contraindication to intra articular steroid injections for at least 3 months.  ? ?This patient is not scheduled to have a total knee replacement within 6 months of starting treatment with viscosupplementation. ? ? ?Follow-Up Instructions: No follow-ups on file.  ? ?Orders:  ?Orders Placed This Encounter  ?Procedures  ? XR Knee 1-2 Views Left  ? ?No orders of the defined types were placed in this encounter. ? ? ? ? Procedures: ?Large Joint Inj: L knee on 12/07/2021 1:16 PM ?  Indications: diagnostic evaluation, joint swelling and pain ?Details: 18 G 1.5 in needle, superolateral approach ? ?Arthrogram: No ? ?Medications: 5 mL lidocaine 1 %; 40 mg methylPREDNISolone acetate 40 MG/ML; 4 mL bupivacaine 0.25 % ?Outcome: tolerated well, no immediate complications ?Procedure, treatment alternatives, risks and benefits explained, specific risks discussed. Consent was given by the patient. Immediately prior to procedure a time out was called to verify the correct patient, procedure, equipment, support staff and site/side marked as required. Patient was prepped and draped in the usual sterile fashion.   ? ? ? ? ?Clinical Data: ?No additional findings. ? ?Objective: ?Vital Signs: Ht 5\' 6"  (1.676 m)   Wt (!) 328 lb 12.8 oz (149.1 kg)   BMI 53.07 kg/m?  ? ?Physical Exam:  ?Constitutional: Patient appears well-developed ?HEENT:  ?Head: Normocephalic ?Eyes:EOM are normal ?Neck: Normal range of motion ?Cardiovascular: Normal rate ?Pulmonary/chest: Effort normal ?Neurologic: Patient is alert ?Skin: Skin is warm ?Psychiatric: Patient has normal mood and affect ? ?Ortho Exam: Ortho exam demonstrates left knee with positive effusion.  Exam was somewhat limited by body habitus.  Tenderness over the lateral joint line moderately and medial joint line mildly.  No pain with hip range of motion.  Patient has significant valgus deformity of the knee.  No calf tenderness.  Negative Homans' sign.  No bruising or ecchymosis noted.  No skin changes noted. ? ?Specialty Comments:  ?No specialty comments available. ? ?Imaging: ?No results found. ? ? ?PMFS History: ?Patient Active Problem List  ? Diagnosis Date Noted  ? Abdominal pain 07/04/2017  ? Gastritis and gastroduodenitis 11/19/2016  ? Increased anion gap metabolic acidosis 53/61/4431  ? Intractable abdominal pain   ? Common bile duct dilatation   ? Cholelithiasis without cholecystitis   ? Intractable nausea and vomiting 10/08/2016  ? Hematemesis 08/14/2016  ? GERD (gastroesophageal reflux disease) 08/14/2016  ? Essential hypertension 08/14/2016  ? GIB (gastrointestinal bleeding) 08/14/2016  ? SIRS (systemic inflammatory response syndrome) ([place]) 08/14/2016  ? CKD (chronic kidney disease) stage V requiring chronic dialysis ([place]) 06/11/2016  ? Non-intractable vomiting with nausea 06/11/2016  ? Hepatic steatosis 06/11/2016  ? Cholelithiasis 06/11/2016  ? Localized osteoarthritis of left knee 07/11/2015  ? Abdominal pain, epigastric 12/05/2014  ? Lapband April 2016 11/27/2014  ? Arthritis of knee 03/27/2014  ? Morbid obesity, weight 291, BMI - 47 02/15/2014  ? Hyperparathyroidism,  secondary ([place]) 03/14/2013  ? ?Past Medical History:  ?Diagnosis Date  ? Arthritis   ? "all over" (11/19/2016)  ? Chronic lower back pain   ? Complication of anesthesia   ? A little while to wake up after knee surgery in 2008  ? Dizziness   ? when coming off of dialysis  ? ESRD (end stage renal disease) on dialysis [facility])   ? MWF and goes to [facility] (11/19/2016)  ? Family history of anesthesia complication   ? mom slow to wake up  ? GERD (gastroesophageal reflux disease)   ? takes Omeprazole as needed  ? Headache(784.0)   ? "monthly" (11/19/2016)  ? History of hiatal hernia   ? Hyperparathyroidism ([place])   ? Hypertension   ? Joint pain   ? Joint swelling   ? Morbid obesity ([place])   ? PONV (postoperative nausea and vomiting)   ? Renal insufficiency   ? Pt is on dyalisis and has been for 14 years  ? Thyroid disease   ?  ?Family History  ?Problem Relation Age of Onset  ? Hypertension Mother   ?  Diabetes Father   ? Kidney Stones Sister   ? Diabetes Maternal Grandmother   ? Liver cancer Maternal Uncle   ? Colon cancer Neg Hx   ? Colon polyps Neg Hx   ? Esophageal cancer Neg Hx   ? Rectal cancer Neg Hx   ? Stomach cancer Neg Hx   ?  ?Past Surgical History:  ?Procedure Laterality Date  ? AV FISTULA PLACEMENT Bilateral 2005,2007  ? "right; left"  ? AV FISTULA REPAIR Right 2007  ? "tried to clean it out; couldn't do it"  ? BOTOX INJECTION N/A 05/25/2019  ? Procedure: BOTOX INJECTION;  Surgeon: Doran Stabler, MD;  Location: [facility];  Service: Gastroenterology;  Laterality: N/A;  ? BREATH TEK H PYLORI N/A 05/15/2014  ? Procedure: BREATH TEK H PYLORI;  Surgeon: Alphonsa Overall, MD;  Location: [facility];  Service: General;  Laterality: N/A;  ? CHOLECYSTECTOMY N/A 11/23/2016  ? Procedure: LAPAROSCOPIC CHOLECYSTECTOMY WITH INTRAOPERATIVE CHOLANGIOGRAM;  Surgeon: Erroll Luna, MD;  Location: [place];  Service: General;  Laterality: N/A;  ? ESOPHAGOGASTRODUODENOSCOPY N/A 12/07/2014  ? Procedure: ESOPHAGOGASTRODUODENOSCOPY  (EGD);  Surgeon: Alphonsa Overall, MD;  Location: [place];  Service: General;  Laterality: N/A;  ? ESOPHAGOGASTRODUODENOSCOPY N/A 12/17/2014  ? Procedure: ESOPHAGOGASTRODUODENOSCOPY (EGD);  Surgeon: Alphonsa Overall,

## 2021-12-09 ENCOUNTER — Telehealth: Payer: Self-pay | Admitting: Surgical

## 2021-12-09 ENCOUNTER — Other Ambulatory Visit: Payer: Self-pay | Admitting: Surgical

## 2021-12-09 MED ORDER — TRAMADOL HCL 50 MG PO TABS: 50.0000 mg | ORAL_TABLET | Freq: Two times a day (BID) | ORAL | 0 refills | Status: DC | PRN

## 2021-12-09 NOTE — Telephone Encounter (Signed)
Sent in refill

## 2021-12-09 NOTE — Telephone Encounter (Signed)
Patient called needing Rx refilled Tramadol. The number to contact patient is [phone] ?

## 2022-03-11 ENCOUNTER — Encounter (INDEPENDENT_AMBULATORY_CARE_PROVIDER_SITE_OTHER): Payer: Self-pay

## 2022-03-12 ENCOUNTER — Ambulatory Visit (INDEPENDENT_AMBULATORY_CARE_PROVIDER_SITE_OTHER): Payer: Medicare Other

## 2022-03-12 ENCOUNTER — Ambulatory Visit (INDEPENDENT_AMBULATORY_CARE_PROVIDER_SITE_OTHER): Payer: Medicare Other | Admitting: Podiatry

## 2022-03-12 DIAGNOSIS — M205X2 Other deformities of toe(s) (acquired), left foot: Secondary | ICD-10-CM

## 2022-03-12 DIAGNOSIS — L84 Corns and callosities: Secondary | ICD-10-CM

## 2022-03-12 DIAGNOSIS — M779 Enthesopathy, unspecified: Secondary | ICD-10-CM

## 2022-03-12 NOTE — Progress Notes (Signed)
Subjective:   Patient ID: [name], male   DOB: 36 y.o.   MRN: [id number]   HPI Chief Complaint  Patient presents with   Foot Problem     soreness on L pinky toe and new hard scab on same toe -not diabetic, Pain has stopped since last week, at the time pain was around an 8  patient feels it maybe due to rubbing of shoes     36 year old male with the above complaints.  Pain on the left 5th toe that started a few Lucks ago. He thought it was from his shoes. No injury. No opening or drainage he notes. He works at [employer] and is on his foot. He notices some pain when on his feet all day. He has tried lotion on his feet but no other treatment.   He is on HD due to HTN.   Review of Systems  All other systems reviewed and are negative.  Past Medical History:  Diagnosis Date   Arthritis    "all over" (11/19/2016)   Chronic lower back pain    Complication of anesthesia    A little while to wake up after knee surgery in 2008   Dizziness    when coming off of dialysis   ESRD (end stage renal disease) on dialysis [facility])    MWF and goes to [facility] (11/19/2016)   Family history of anesthesia complication    mom slow to wake up   GERD (gastroesophageal reflux disease)    takes Omeprazole as needed   Headache(784.0)    "monthly" (11/19/2016)   History of hiatal hernia    Hyperparathyroidism ([place])    Hypertension    Joint pain    Joint swelling    Morbid obesity ([place])    PONV (postoperative nausea and vomiting)    Renal insufficiency    Pt is on dyalisis and has been for 14 years   Thyroid disease     Past Surgical History:  Procedure Laterality Date   AV FISTULA PLACEMENT Bilateral 2005,2007   "right; left"   AV FISTULA REPAIR Right 2007   "tried to clean it out; couldn't do it"   BOTOX INJECTION N/A 05/25/2019   Procedure: BOTOX INJECTION;  Surgeon: Doran Stabler, MD;  Location: [facility];  Service: Gastroenterology;  Laterality: N/A;   BREATH TEK H PYLORI  N/A 05/15/2014   Procedure: BREATH TEK H PYLORI;  Surgeon: Alphonsa Overall, MD;  Location: [facility];  Service: General;  Laterality: N/A;   CHOLECYSTECTOMY N/A 11/23/2016   Procedure: LAPAROSCOPIC CHOLECYSTECTOMY WITH INTRAOPERATIVE CHOLANGIOGRAM;  Surgeon: Erroll Luna, MD;  Location: [place];  Service: General;  Laterality: N/A;   ESOPHAGOGASTRODUODENOSCOPY N/A 12/07/2014   Procedure: ESOPHAGOGASTRODUODENOSCOPY (EGD);  Surgeon: Alphonsa Overall, MD;  Location: [facility] ENDOSCOPY;  Service: General;  Laterality: N/A;   ESOPHAGOGASTRODUODENOSCOPY N/A 12/17/2014   Procedure: ESOPHAGOGASTRODUODENOSCOPY (EGD);  Surgeon: Alphonsa Overall, MD;  Location: [place];  Service: General;  Laterality: N/A;   ESOPHAGOGASTRODUODENOSCOPY N/A 08/16/2016   Procedure: ESOPHAGOGASTRODUODENOSCOPY (EGD);  Surgeon: Jerene Bears, MD;  Location: [place];  Service: Gastroenterology;  Laterality: N/A;   ESOPHAGOGASTRODUODENOSCOPY (EGD) WITH PROPOFOL N/A 07/05/2017   Procedure: ESOPHAGOGASTRODUODENOSCOPY (EGD) WITH PROPOFOL;  Surgeon: Milus Banister, MD;  Location: [facility] ENDOSCOPY;  Service: Endoscopy;  Laterality: N/A;   ESOPHAGOGASTRODUODENOSCOPY (EGD) WITH PROPOFOL N/A 05/25/2019   Procedure: ESOPHAGOGASTRODUODENOSCOPY (EGD) WITH PROPOFOL;  Surgeon: Doran Stabler, MD;  Location: [facility];  Service: Gastroenterology;  Laterality: N/A;  HERNIA REPAIR  11/2014   w/lap band OR   JOINT REPLACEMENT     KNEE ARTHROSCOPY Left 2007   LAPAROSCOPIC GASTRIC BANDING N/A 11/12/2014   Procedure: LAPAROSCOPIC GASTRIC BANDING;  Surgeon: Alphonsa Overall, MD;  Location: [facility];  Service: General;  Laterality: N/A;   PARATHYROIDECTOMY N/A 03/30/2013   Procedure: TOTAL PARATHYROIDECTOMY WITH AUTOTRANSPLANT;  Surgeon: Earnstine Regal, MD;  Location: [place];  Service: General;  Laterality: N/A;  Autotransplant to left lower arm.   TOTAL KNEE ARTHROPLASTY Right 03/27/2014   Procedure: UNICOMPARTMENTAL ARTHROPLASTY;  Surgeon: Meredith Pel, MD;   Location: [place];  Service: Orthopedics;  Laterality: Right;     Current Outpatient Medications:    albuterol (PROVENTIL HFA;VENTOLIN HFA) 108 (90 Base) MCG/ACT inhaler, Inhale 2 puffs into the lungs every 4 (four) hours as needed for wheezing or shortness of breath., Disp: 1 Inhaler, Rfl: 0   b complex-vitamin c-folic acid (NEPHRO-VITE) 0.8 MG TABS tablet, Take 1 tablet by mouth daily. , Disp: , Rfl:    calcium carbonate (TUMS EX) 750 MG chewable tablet, Chew 4 tablets by mouth 3 (three) times daily., Disp: , Rfl:    famotidine (PEPCID) 20 MG tablet, Take 1 tablet (20 mg total) by mouth daily., Disp: 30 tablet, Rfl: 0   meloxicam (MOBIC) 15 MG tablet, Take 0.5-1 tablets (7.5-15 mg total) by mouth daily as needed for pain., Disp: 30 tablet, Rfl: 6   metoCLOPramide (REGLAN) 5 MG tablet, Take 1 tablet (5 mg total) by mouth 2 (two) times daily as needed for nausea., Disp: 10 tablet, Rfl: 0   OZEMPIC, 0.25 OR 0.5 MG/DOSE, 2 MG/1.5ML SOPN, Inject 0.5 mg into the skin once a week., Disp: , Rfl:    pantoprazole (PROTONIX) 40 MG tablet, Take 1 tablet (40 mg total) by mouth daily. Take 30 minutes before breakfast. (Patient taking differently: Take 40 mg by mouth daily as needed (acid refux). Take 30 minutes before breakfast.), Disp: 30 tablet, Rfl: 3   sevelamer carbonate (RENVELA) 2.4 G PACK, Take 2.4 g by mouth 3 (three) times daily with meals., Disp: , Rfl:    tiZANidine (ZANAFLEX) 4 MG tablet, Take 4 mg by mouth every 8 (eight) hours as needed for muscle spasms. , Disp: , Rfl:    traMADol (ULTRAM) 50 MG tablet, Take 1 tablet (50 mg total) by mouth every 12 (twelve) hours as needed., Disp: 30 tablet, Rfl: 0  Allergies  Allergen Reactions   Dilaudid [Hydromorphone Hcl] Other (See Comments)    Patient became hypoxic to 60% with dilaudid 1mg  IV   Amlodipine Other (See Comments)   Ascorbate Itching   Midodrine Itching           Objective:  Physical Exam   General: AAO x3,  NAD  Dermatological: Hyperkeratotic lesion just adjacent to the left fifth toenail consistent with a listers corn.  There is no ongoing ulceration drainage or any signs of infection noted today.  No open lesions.  Vascular: Dorsalis Pedis artery and Posterior Tibial artery pedal pulses are 2/4 bilateral with immedate capillary fill time. There is no pain with calf compression, swelling, warmth, erythema.   Neruologic: Grossly intact via light touch bilateral.   Musculoskeletal: Adductovarus present fifth toe.  Tenderness the hyperkeratotic lesion.  No other area discomfort.  Muscular strength 5/5 in all groups tested bilateral.  Gait: Unassisted, Nonantalgic.       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  Recommend moisturizer, offloading.  Discussed shoe modifications. -Monitor for any clinical signs or symptoms of infection and directed to call the office immediately should any occur or go to the ER.  Return if symptoms worsen or fail to improve.Trula Slade DPM

## 2022-04-21 ENCOUNTER — Ambulatory Visit: Payer: Medicare Other | Attending: Cardiology | Admitting: Physician Assistant

## 2022-04-21 ENCOUNTER — Encounter: Payer: Self-pay | Admitting: Physician Assistant

## 2022-04-21 VITALS — BP 100/58 | HR 65 | Ht 66.0 in | Wt 317.0 lb

## 2022-04-21 DIAGNOSIS — R002 Palpitations: Secondary | ICD-10-CM

## 2022-04-21 DIAGNOSIS — G4733 Obstructive sleep apnea (adult) (pediatric): Secondary | ICD-10-CM | POA: Diagnosis not present

## 2022-04-21 DIAGNOSIS — I10 Essential (primary) hypertension: Secondary | ICD-10-CM | POA: Diagnosis not present

## 2022-04-21 DIAGNOSIS — Z992 Dependence on renal dialysis: Secondary | ICD-10-CM

## 2022-04-21 DIAGNOSIS — N186 End stage renal disease: Secondary | ICD-10-CM

## 2022-04-21 NOTE — Patient Instructions (Addendum)
Medication Instructions:  Your physician recommends that you continue on your current medications as directed. Please refer to the Current Medication list given to you today. *If you need a refill on your cardiac medications before your next appointment, please call your pharmacy*   Lab Work: None Ordered   Testing/Procedures: Your physician has requested that you have an echocardiogram. Echocardiography is a painless test that uses sound waves to create images of your heart. It provides your doctor with information about the size and shape of your heart and how well your heart's chambers and valves are working. This procedure takes approximately one hour. There are no restrictions for this procedure.  Your physician has recommended that you wear an 30 day event monitor. Event monitors are medical devices that record the heart's electrical activity. Doctors most often [place] these monitors to diagnose arrhythmias. Arrhythmias are problems with the speed or rhythm of the heartbeat. The monitor is a small, portable device. You can wear one while you do your normal daily activities. This is usually used to diagnose what is causing palpitations/syncope (passing out). YOUR MONITOR WILL BE MAILED TO YOU.   Follow-Up: At [facility], you and your health needs are our priority.  As part of our continuing mission to provide you with exceptional heart care, we have created designated Provider Care Teams.  These Care Teams include your primary Cardiologist (physician) and Advanced Practice Providers (APPs -  Physician Assistants and Nurse Practitioners) who all work together to provide you with the care you need, when you need it.  We recommend signing up for the patient portal called "MyChart".  Sign up information is provided on this After Visit Summary.  MyChart is used to connect with patients for Virtual Visits (Telemedicine).  Patients are able to view lab/test results, encounter notes, upcoming  appointments, etc.  Non-urgent messages can be sent to your provider as well.   To learn more about what you can do with MyChart, go to [email or web address].    Your next appointment:   2 month(s)  The format for your next appointment:   In Person  Provider:   Fransico Him, MD  (SLEEP APPOINTMENT)   Other Instructions WE WILL HAVE THE SLEEP COORDINATOR REACH OUT TO YOU TO ANSWER YOUR QUESTIONS ABOUT YOUR CPAP WE HAVE REQUESTED A COPY OF YOUR EKG AND LAB RESULTS FROM YOUR PCP.  Important Information About [place]

## 2022-04-21 NOTE — Progress Notes (Signed)
Cardiology Office Note    Date:  04/21/2022   ID:  [name], DOB 1985-12-22, MRN [id number]  PCP:  Nolene Ebbs, MD  Cardiologist:  Fransico Him, MD  Electrophysiologist:  None   Chief Complaint: f/u palpitations  History of Present Illness:   [name] is a 37 y.o. male with history of ESRD on HD, HTN, GERD, morbid obesity, OSA on CPAP, chronic back pain, arthritis, hiatal hernia, hyperparathyroidism who is seen for follow-up. He previously saw Dr. Radford Macias in 06/2021 for palpitations felt likely due to PACs/PVCs. Event monitor was ordered but patient did not have this performed as he did not know how to apply the monitor when it arrived and sent the monitor back. He was also diagnosed with OSA and started on CPAP. At APP f/u 10/2021 he was not having any further palpitations and 6 month f/u recommended.  He returns for follow-up today feeling well. He does still occasionally get palpitations about every 2 [name] lasting a few minutes at a time, described as his heart skipping beats. He denies any CP, SOB, or edema. He's been on HD for 19 years - states it was high BP related - was coming out of high school when he started dialysis. He is hopeful for eventual kidney transplant but has to lose weight first. No formal plans or clearance have been initiated yet. He reports he had labs and EKG at PCP yesterday. He has questions about his CPAP settings. He has been compliant with use. BP is borderline low but he reports this is normal for him in the AM.  Labwork independently reviewed: Outside labs 01/2022 LDL 106 (PCP managing), trig 85, TSH wnl, A1C wnl, LFTs ok 11/2020 CBC wnl, K 4.2, Cr 13.45   Cardiology Studies:   Studies reviewed are outlined and summarized above. Reports included below if pertinent.   None    Past Medical History:  Diagnosis Date   Arthritis    "all over" (11/19/2016)   Chronic lower back pain    Complication of anesthesia    A little while to wake up  after knee surgery in 2008   Dizziness    when coming off of dialysis   ESRD (end stage renal disease) on dialysis [facility])    MWF and goes to [facility] (11/19/2016)   Family history of anesthesia complication    mom slow to wake up   GERD (gastroesophageal reflux disease)    takes Omeprazole as needed   Headache(784.0)    "monthly" (11/19/2016)   History of hiatal hernia    Hyperparathyroidism ([place])    Hypertension    Joint pain    Joint swelling    Morbid obesity ([place])    PONV (postoperative nausea and vomiting)    Thyroid disease     Past Surgical History:  Procedure Laterality Date   AV FISTULA PLACEMENT Bilateral 2005,2007   "right; left"   AV FISTULA REPAIR Right 2007   "tried to clean it out; couldn't do it"   BOTOX INJECTION N/A 05/25/2019   Procedure: BOTOX INJECTION;  Surgeon: Doran Stabler, MD;  [facility]: [facility];  Service: Gastroenterology;  Laterality: N/A;   BREATH TEK H PYLORI N/A 05/15/2014   Procedure: BREATH TEK H PYLORI;  Surgeon: Alphonsa Overall, MD;  Location: [facility];  Service: General;  Laterality: N/A;   CHOLECYSTECTOMY N/A 11/23/2016   Procedure: LAPAROSCOPIC CHOLECYSTECTOMY WITH INTRAOPERATIVE CHOLANGIOGRAM;  Surgeon: Erroll Luna, MD;  Location: [place];  Service: General;  Laterality: N/A;   ESOPHAGOGASTRODUODENOSCOPY N/A 12/07/2014   Procedure: ESOPHAGOGASTRODUODENOSCOPY (EGD);  Surgeon: Alphonsa Overall, MD;  Location: [facility] ENDOSCOPY;  Service: General;  Laterality: N/A;   ESOPHAGOGASTRODUODENOSCOPY N/A 12/17/2014   Procedure: ESOPHAGOGASTRODUODENOSCOPY (EGD);  Surgeon: Alphonsa Overall, MD;  Location: [place];  Service: General;  Laterality: N/A;   ESOPHAGOGASTRODUODENOSCOPY N/A 08/16/2016   Procedure: ESOPHAGOGASTRODUODENOSCOPY (EGD);  Surgeon: Jerene Bears, MD;  Location: [place];  Service: Gastroenterology;  Laterality: N/A;   ESOPHAGOGASTRODUODENOSCOPY (EGD) WITH PROPOFOL N/A 07/05/2017   Procedure: ESOPHAGOGASTRODUODENOSCOPY (EGD) WITH  PROPOFOL;  Surgeon: Milus Banister, MD;  Location: [facility] ENDOSCOPY;  Service: Endoscopy;  Laterality: N/A;   ESOPHAGOGASTRODUODENOSCOPY (EGD) WITH PROPOFOL N/A 05/25/2019   Procedure: ESOPHAGOGASTRODUODENOSCOPY (EGD) WITH PROPOFOL;  Surgeon: Doran Stabler, MD;  [facility];  Service: Gastroenterology;  Laterality: N/A;   HERNIA REPAIR  11/2014   w/lap band OR   JOINT REPLACEMENT     KNEE ARTHROSCOPY Left 2007   LAPAROSCOPIC GASTRIC BANDING N/A 11/12/2014   Procedure: LAPAROSCOPIC GASTRIC BANDING;  Surgeon: Alphonsa Overall, MD;  [facility];  Service: General;  Laterality: N/A;   PARATHYROIDECTOMY N/A 03/30/2013   Procedure: TOTAL PARATHYROIDECTOMY WITH AUTOTRANSPLANT;  Surgeon: Earnstine Regal, MD;  Location: [place];  Service: General;  Laterality: N/A;  Autotransplant to left lower arm.   TOTAL KNEE ARTHROPLASTY Right 03/27/2014   Procedure: UNICOMPARTMENTAL ARTHROPLASTY;  Surgeon: Meredith Pel, MD;  Location: [place];  Service: Orthopedics;  Laterality: Right;    Current Medications: Current Meds  Medication Sig   albuterol (PROVENTIL HFA;VENTOLIN HFA) 108 (90 Base) MCG/ACT inhaler Inhale 2 puffs into the lungs every 4 (four) hours as needed for wheezing or shortness of breath.   b complex-vitamin c-folic acid (NEPHRO-VITE) 0.8 MG TABS tablet Take 1 tablet by mouth daily.    calcium carbonate (TUMS EX) 750 MG chewable tablet Chew 4 tablets by mouth 3 (three) times daily.   famotidine (PEPCID) 20 MG tablet Take 1 tablet (20 mg total) by mouth daily.   meloxicam (MOBIC) 15 MG tablet Take 0.5-1 tablets (7.5-15 mg total) by mouth daily as needed for pain.   metoCLOPramide (REGLAN) 5 MG tablet Take 1 tablet (5 mg total) by mouth 2 (two) times daily as needed for nausea.   OZEMPIC, 0.25 OR 0.5 MG/DOSE, 2 MG/1.5ML SOPN Inject 0.5 mg into the skin once a week.   pantoprazole (PROTONIX) 40 MG tablet Take 1 tablet (40 mg total) by mouth daily. Take 30 minutes before breakfast.  (Patient taking differently: Take 40 mg by mouth daily as needed (acid refux). Take 30 minutes before breakfast.)   sevelamer carbonate (RENVELA) 2.4 G PACK Take 2.4 g by mouth 3 (three) times daily with meals.   tiZANidine (ZANAFLEX) 4 MG tablet Take 4 mg by mouth every 8 (eight) hours as needed for muscle spasms.    traMADol (ULTRAM) 50 MG tablet Take 1 tablet (50 mg total) by mouth every 12 (twelve) hours as needed.      Allergies:   Dilaudid [hydromorphone hcl], Amlodipine, Ascorbate, and Midodrine   Social History   Socioeconomic History   Marital status: Single    Spouse name: Not on file   Number of children: 0   Years of education: Not on file   Highest education level: Not on file  Occupational History   Not on file  Tobacco Use   Smoking status: Former    Packs/day: 0.10    Years: 16.00    Total pack  years: 1.60    Types: Cigarettes    Quit date: 01/17/2019    Years since quitting: 3.2   Smokeless tobacco: Never  Vaping Use   Vaping Use: Never used  Substance and Sexual Activity   Alcohol use: No    Alcohol/week: 0.0 standard drinks of alcohol   Drug use: No   Sexual activity: Yes  Other Topics Concern   Not on file  Social History Narrative   Not on file   Social Determinants of Health   Financial Resource Strain: Not on file  Food Insecurity: Not on file  Transportation Needs: Not on file  Physical Activity: Not on file  Stress: Not on file  Social Connections: Not on file     Family History:  The patient's family history includes Diabetes in his father and maternal grandmother; Hypertension in his mother; Kidney Stones in his sister; Liver cancer in his maternal uncle. There is no history of Colon cancer, Colon polyps, Esophageal cancer, Rectal cancer, or Stomach cancer.  ROS:   Please see the history of present illness.  All other systems are reviewed and otherwise negative.    EKG(s)/Additional Labs   EKG:  EKG is ordered today, personally  reviewed, demonstrating NSR 65bpm, no onspecific STTW changes, no acute change from prior  Recent Labs: No results found for requested labs within last 365 days.  Recent Lipid Panel No results found for: "CHOL", "TRIG", "HDL", "CHOLHDL", "VLDL", "LDLCALC", "LDLDIRECT"  PHYSICAL EXAM:    VS:  BP (!) 100/58   Pulse 65   Ht 5\' 6"  (1.676 m)   Wt (!) 317 lb (143.8 kg)   SpO2 98%   BMI 51.17 kg/m   BMI: Body mass index is 51.17 kg/m.  GEN: Well nourished, well developed morbidly obese male in no acute distress HEENT: normocephalic, atraumatic Neck: no JVD, carotid bruits, or masses Cardiac: RRR; no murmurs, rubs, or gallops, no edema  Respiratory:  clear to auscultation bilaterally, normal work of breathing GI: soft, nontender, nondistended, + BS MS: no deformity or atrophy Skin: warm and dry, no rash Neuro:  Alert and Oriented x 3, Strength and sensation are intact, follows commands Psych: euthymic mood, full affect  Wt Readings from Last 3 Encounters:  04/21/22 (!) 317 lb (143.8 kg)  12/01/21 (!) 328 lb 12.8 oz (149.1 kg)  10/29/21 (!) 326 lb (147.9 kg)     ASSESSMENT & PLAN:   1. Palpitations - infrequent but still periodically occurring. Will try to get copy of labs he had at PCP yesterday. Will move forward with 30-day event monitor (patient agreeable), duration chosen given the infrequency of events. Will also obtain baseline echocardiogram. Hold off empiric rx given tendency for low-normal BP.  2. Morbid obesity with OSA - he has been working on weight loss and compliant with CPAP. He has questions about his machine settings. Will ask CMA to reach out to [facility] with sleep team to call him to discuss. He also needs f/u with Dr. Radford Macias for follow-up of his OSA.  3. Essential HTN - no longer on any agents for this. BP low-normal but he reports this is normal for him in the morning. Will defer management of BP to nephrology team. Will try to get copy of labs that were done at PCP  yesterday.  4. ESRD on HD - managed by nephrology. He is hopeful to eventually be considered for kidney transplant though has been told he needs to lose weight first, so that is what he  is working on. As above, we'll get a baseline echocardiogram given h/o palpitations. Otherwise would await direction from his transplant team, if he gains candidacy, about more definitive cardiac workup. Given no CP or SOB, however, ischemic testing is not specifically indicated at this time otherwise.    Disposition: F/u with Dr. Radford Macias by 06/2022 to f/u OSA, at which time further general cardiology f/u can be decided.   Medication Adjustments/Labs and Tests Ordered: Current medicines are reviewed at length with the patient today.  Concerns regarding medicines are outlined above. Medication changes, Labs and Tests ordered today are summarized above and listed in the Patient Instructions accessible in Encounters.   Signed, Charlie Pitter, [name]  04/21/2022 9:15 AM    [place] Phone: [phone]; Fax: [phone]

## 2022-04-23 ENCOUNTER — Ambulatory Visit: Payer: Medicare Other | Admitting: Cardiology

## 2022-04-29 ENCOUNTER — Other Ambulatory Visit: Payer: Self-pay | Admitting: Surgical

## 2022-04-29 ENCOUNTER — Ambulatory Visit (INDEPENDENT_AMBULATORY_CARE_PROVIDER_SITE_OTHER): Payer: Medicare Other | Admitting: Orthopedic Surgery

## 2022-04-29 ENCOUNTER — Telehealth: Payer: Self-pay | Admitting: Orthopedic Surgery

## 2022-04-29 DIAGNOSIS — M1712 Unilateral primary osteoarthritis, left knee: Secondary | ICD-10-CM | POA: Diagnosis not present

## 2022-04-29 MED ORDER — TRAMADOL HCL 50 MG PO TABS: 50.0000 mg | ORAL_TABLET | Freq: Two times a day (BID) | ORAL | 1 refills | Status: DC | PRN

## 2022-04-29 NOTE — Telephone Encounter (Signed)
Sent in

## 2022-04-29 NOTE — Telephone Encounter (Signed)
Okay for tramadol 1 p.o. twice daily as needed pain #60 with no refills thanks

## 2022-04-29 NOTE — Telephone Encounter (Signed)
Pt called stating he had an appt today and forgot to said for pain meds. Please send to pharmacy on file. Pt phone number is [phone].

## 2022-04-30 ENCOUNTER — Encounter: Payer: Self-pay | Admitting: Orthopedic Surgery

## 2022-04-30 NOTE — Progress Notes (Signed)
Office Visit Note   Patient: [name]           Date of Birth: Aug 28, 1985           MRN: [id number] Visit Date: 04/29/2022 Requested by: Nolene Ebbs, MD [address] [place],  [place] [address] PCP: Nolene Ebbs, MD  Subjective: Chief Complaint  Patient presents with   Left Knee - Pain    HPI: [name] is a 36 year old patient with left knee pain.  Had left knee injection 12/01/2021.  Also had an injection more recently which also did not give him much relief.  Patient has BMI of 51.  Also is on the transplant list and has weight loss surgery pending.              ROS: All systems reviewed are negative as they relate to the chief complaint within the history of present illness.  Patient denies  fevers or chills.   Assessment & Plan: Visit Diagnoses:  1. Primary osteoarthritis of left knee     Plan: Impression is left knee arthritis with valgus deformity.  Real question is what is best sequence for knee replacement for this patient.  Ideally it would be done before he has any type of transplant.  BMI currently is too high for knee replacement could consider surgery Monday before or after the weight loss surgery.  He is not a candidate for partial knee replacement on the left-hand side.  He has done well with the partial knee replacement on the right.  He also is on dialysis which increases his risk of postop complications.  Follow-Up Instructions: No follow-ups on file.   Orders:  No orders of the defined types were placed in this encounter.  No orders of the defined types were placed in this encounter.     Procedures: No procedures performed   Clinical Data: No additional findings.  Objective: Vital Signs: There were no vitals taken for this visit.  Physical Exam:   Constitutional: Patient appears well-developed HEENT:  Head: Normocephalic Eyes:EOM are normal Neck: Normal range of motion Cardiovascular: Normal rate Pulmonary/chest: Effort  normal Neurologic: Patient is alert Skin: Skin is warm Psychiatric: Patient has normal mood and affect   Ortho Exam: Ortho exam demonstrates full active and passive range of motion at the left knee with valgus deformity.  Pedal pulses palpable.  Flexes to about 100 degrees with good extension.  Extensor mechanism intact.  Findings not really correctable.  Specialty Comments:  No specialty comments available.  Imaging: No results found.   PMFS History: Patient Active Problem List   Diagnosis Date Noted   Abdominal pain 07/04/2017   Gastritis and gastroduodenitis 11/19/2016   Increased anion gap metabolic acidosis 85/63/1497   Intractable abdominal pain    Common bile duct dilatation    Cholelithiasis without cholecystitis    Intractable nausea and vomiting 10/08/2016   Hematemesis 08/14/2016   GERD (gastroesophageal reflux disease) 08/14/2016   Essential hypertension 08/14/2016   GIB (gastrointestinal bleeding) 08/14/2016   SIRS (systemic inflammatory response syndrome) ([place]) 08/14/2016   CKD (chronic kidney disease) stage V requiring chronic dialysis ([place]) 06/11/2016   Non-intractable vomiting with nausea 06/11/2016   Hepatic steatosis 06/11/2016   Cholelithiasis 06/11/2016   Localized osteoarthritis of left knee 07/11/2015   Abdominal pain, epigastric 12/05/2014   Lapband April 2016 11/27/2014   Arthritis of knee 03/27/2014   Morbid obesity, weight 291, BMI - 47 02/15/2014   Hyperparathyroidism, secondary ([place]) 03/14/2013   Past Medical History:  Diagnosis Date   Arthritis    "all over" (11/19/2016)   Chronic lower back pain    Complication of anesthesia    A little while to wake up after knee surgery in 2008   Dizziness    when coming off of dialysis   ESRD (end stage renal disease) on dialysis [facility])    MWF and goes to [facility] (11/19/2016)   Family history of anesthesia complication    mom slow to wake up   GERD (gastroesophageal reflux disease)    takes  Omeprazole as needed   Headache(784.0)    "monthly" (11/19/2016)   History of hiatal hernia    Hyperparathyroidism ([place])    Hypertension    Joint pain    Joint swelling    Morbid obesity ([place])    PONV (postoperative nausea and vomiting)    Thyroid disease     Family History  Problem Relation Age of Onset   Hypertension Mother    Diabetes Father    Kidney Stones Sister    Diabetes Maternal Grandmother    Liver cancer Maternal Uncle    Colon cancer Neg Hx    Colon polyps Neg Hx    Esophageal cancer Neg Hx    Rectal cancer Neg Hx    Stomach cancer Neg Hx     Past Surgical History:  Procedure Laterality Date   AV FISTULA PLACEMENT Bilateral 2005,2007   "right; left"   AV FISTULA REPAIR Right 2007   "tried to clean it out; couldn't do it"   BOTOX INJECTION N/A 05/25/2019   Procedure: BOTOX INJECTION;  Surgeon: Doran Stabler, MD;  [facility];  Service: Gastroenterology;  Laterality: N/A;   BREATH TEK H PYLORI N/A 05/15/2014   Procedure: BREATH TEK H PYLORI;  Surgeon: Alphonsa Overall, MD;  Location: [facility];  Service: General;  Laterality: N/A;   CHOLECYSTECTOMY N/A 11/23/2016   Procedure: LAPAROSCOPIC CHOLECYSTECTOMY WITH INTRAOPERATIVE CHOLANGIOGRAM;  Surgeon: Erroll Luna, MD;  Location: [place];  Service: General;  Laterality: N/A;   ESOPHAGOGASTRODUODENOSCOPY N/A 12/07/2014   Procedure: ESOPHAGOGASTRODUODENOSCOPY (EGD);  Surgeon: Alphonsa Overall, MD;  Location: [facility] ENDOSCOPY;  Service: General;  Laterality: N/A;   ESOPHAGOGASTRODUODENOSCOPY N/A 12/17/2014   Procedure: ESOPHAGOGASTRODUODENOSCOPY (EGD);  Surgeon: Alphonsa Overall, MD;  Location: [place];  Service: General;  Laterality: N/A;   ESOPHAGOGASTRODUODENOSCOPY N/A 08/16/2016   Procedure: ESOPHAGOGASTRODUODENOSCOPY (EGD);  Surgeon: Jerene Bears, MD;  Location: [place];  Service: Gastroenterology;  Laterality: N/A;   ESOPHAGOGASTRODUODENOSCOPY (EGD) WITH PROPOFOL N/A 07/05/2017   Procedure:  ESOPHAGOGASTRODUODENOSCOPY (EGD) WITH PROPOFOL;  Surgeon: Milus Banister, MD;  Location: [facility] ENDOSCOPY;  Service: Endoscopy;  Laterality: N/A;   ESOPHAGOGASTRODUODENOSCOPY (EGD) WITH PROPOFOL N/A 05/25/2019   Procedure: ESOPHAGOGASTRODUODENOSCOPY (EGD) WITH PROPOFOL;  Surgeon: Doran Stabler, MD;  [facility];  Service: Gastroenterology;  Laterality: N/A;   HERNIA REPAIR  11/2014   w/lap band OR   JOINT REPLACEMENT     KNEE ARTHROSCOPY Left 2007   LAPAROSCOPIC GASTRIC BANDING N/A 11/12/2014   Procedure: LAPAROSCOPIC GASTRIC BANDING;  Surgeon: Alphonsa Overall, MD;  [facility];  Service: General;  Laterality: N/A;   PARATHYROIDECTOMY N/A 03/30/2013   Procedure: TOTAL PARATHYROIDECTOMY WITH AUTOTRANSPLANT;  Surgeon: Earnstine Regal, MD;  Location: [place];  Service: General;  Laterality: N/A;  Autotransplant to left lower arm.   TOTAL KNEE ARTHROPLASTY Right 03/27/2014   Procedure: UNICOMPARTMENTAL ARTHROPLASTY;  Surgeon: Meredith Pel, MD;  Location: [place];  Service: Orthopedics;  Laterality: Right;   Social History   Occupational History   Not on file  Tobacco Use   Smoking status: Former    Packs/day: 0.10    Years: 16.00    Total pack years: 1.60    Types: Cigarettes    Quit date: 01/17/2019    Years since quitting: 3.2   Smokeless tobacco: Never  Vaping Use   Vaping Use: Never used  Substance and Sexual Activity   Alcohol use: No    Alcohol/week: 0.0 standard drinks of alcohol   Drug use: No   Sexual activity: Yes

## 2022-05-04 ENCOUNTER — Ambulatory Visit: Payer: Medicare Other | Attending: Physician Assistant

## 2022-05-04 DIAGNOSIS — R002 Palpitations: Secondary | ICD-10-CM | POA: Diagnosis not present

## 2022-05-04 DIAGNOSIS — I10 Essential (primary) hypertension: Secondary | ICD-10-CM

## 2022-05-04 DIAGNOSIS — G4733 Obstructive sleep apnea (adult) (pediatric): Secondary | ICD-10-CM

## 2022-05-04 DIAGNOSIS — N186 End stage renal disease: Secondary | ICD-10-CM

## 2022-05-07 ENCOUNTER — Ambulatory Visit (HOSPITAL_COMMUNITY): Payer: Medicare Other | Attending: Physician Assistant

## 2022-05-07 DIAGNOSIS — Z992 Dependence on renal dialysis: Secondary | ICD-10-CM | POA: Insufficient documentation

## 2022-05-07 DIAGNOSIS — R002 Palpitations: Secondary | ICD-10-CM | POA: Diagnosis not present

## 2022-05-07 DIAGNOSIS — N186 End stage renal disease: Secondary | ICD-10-CM | POA: Diagnosis present

## 2022-05-07 DIAGNOSIS — I10 Essential (primary) hypertension: Secondary | ICD-10-CM

## 2022-05-07 DIAGNOSIS — G4733 Obstructive sleep apnea (adult) (pediatric): Secondary | ICD-10-CM | POA: Diagnosis present

## 2022-05-07 LAB — ECHOCARDIOGRAM COMPLETE
Area-P 1/2: 3.99 cm2
S' Lateral: 3.5 cm

## 2022-06-09 ENCOUNTER — Telehealth: Payer: Self-pay | Admitting: Cardiology

## 2022-06-09 NOTE — Telephone Encounter (Signed)
Patient unable to get through to [employer] to arrange monitor pickup by [employer]. Patient is going to drop his monitor off at our [facility] location, 06/10/22, on his way to dialysis. We can have [employer] pick up the package at our location.

## 2022-06-09 NOTE — Telephone Encounter (Signed)
Patient unable to get through to [employer] to arrange monitor pickup by [employer]. Patient is going to drop his monitor off at our [facility] location, 06/10/22, on his way to dialysis. We can have [employer] pick up the package at our location.

## 2022-06-09 NOTE — Telephone Encounter (Signed)
Patient stated he would like assistance contacting the heart monitor company to return his heart monitor.

## 2022-06-16 ENCOUNTER — Ambulatory Visit: Payer: Medicare Other | Admitting: Cardiology

## 2022-06-17 ENCOUNTER — Ambulatory Visit: Payer: Medicare Other | Attending: Cardiology | Admitting: Cardiology

## 2022-06-17 ENCOUNTER — Encounter: Payer: Self-pay | Admitting: Cardiology

## 2022-06-17 VITALS — BP 92/54 | HR 65 | Ht 66.0 in | Wt 320.2 lb

## 2022-06-17 DIAGNOSIS — R002 Palpitations: Secondary | ICD-10-CM | POA: Diagnosis not present

## 2022-06-17 DIAGNOSIS — I10 Essential (primary) hypertension: Secondary | ICD-10-CM | POA: Diagnosis not present

## 2022-06-17 DIAGNOSIS — G4733 Obstructive sleep apnea (adult) (pediatric): Secondary | ICD-10-CM

## 2022-06-17 NOTE — Progress Notes (Signed)
Cardiology Note    Date:  06/17/2022   ID:  [name], DOB 11-20-1985, MRN [id number]  PCP:  Nolene Ebbs, MD  Cardiologist:  Fransico Him, MD   Chief Complaint  Patient presents with   Hypertension   Sleep Apnea   Palpitations    History of Present Illness:  [name] is a 36 y.o. male  with a history of hypertension, GERD, end-stage renal disease related to HTN on HD who saw me last year for palpitations.  He wore a 30-day heart which showed no arrhythmias.  He also underwent sleep study which showed mild obstructive sleep apnea with an AHI of 11.6/h and was started on CPAP therapy.  He is now here for follow-up.  He is doing well with his PAP device and thinks that he has gotten used to it.  He tolerates the mask and feels the pressure is adequate.  Since going on PAP he feels rested in the am if he slept well the night before.  He falls asleep during HD but does not fall asleep any other time.  He denies any significant mouth or nasal dryness or nasal congestion.  He does not think that he snores.    Past Medical History:  Diagnosis Date   Arthritis    "all over" (11/19/2016)   Chronic lower back pain    Complication of anesthesia    A little while to wake up after knee surgery in 2008   Dizziness    when coming off of dialysis   ESRD (end stage renal disease) on dialysis [facility])    MWF and goes to [employer] (11/19/2016)   Family history of anesthesia complication    mom slow to wake up   GERD (gastroesophageal reflux disease)    takes Omeprazole as needed   Headache(784.0)    "monthly" (11/19/2016)   History of hiatal hernia    Hyperparathyroidism ([place])    Hypertension    Joint pain    Joint swelling    Morbid obesity ([place])    PONV (postoperative nausea and vomiting)    Thyroid disease     Past Surgical History:  Procedure Laterality Date   AV FISTULA PLACEMENT Bilateral 2005,2007   "right; left"   AV FISTULA REPAIR Right 2007   "tried to clean  it out; couldn't do it"   BOTOX INJECTION N/A 05/25/2019   Procedure: BOTOX INJECTION;  Surgeon: Doran Stabler, MD;  [facility]: [facility] ENDOSCOPY;  Service: Gastroenterology;  Laterality: N/A;   BREATH TEK H PYLORI N/A 05/15/2014   Procedure: BREATH TEK H PYLORI;  Surgeon: Alphonsa Overall, MD;  Location: [facility];  Service: General;  Laterality: N/A;   CHOLECYSTECTOMY N/A 11/23/2016   Procedure: LAPAROSCOPIC CHOLECYSTECTOMY WITH INTRAOPERATIVE CHOLANGIOGRAM;  Surgeon: Erroll Luna, MD;  Location: [place];  Service: General;  Laterality: N/A;   ESOPHAGOGASTRODUODENOSCOPY N/A 12/07/2014   Procedure: ESOPHAGOGASTRODUODENOSCOPY (EGD);  Surgeon: Alphonsa Overall, MD;  Location: [facility] ENDOSCOPY;  Service: General;  Laterality: N/A;   ESOPHAGOGASTRODUODENOSCOPY N/A 12/17/2014   Procedure: ESOPHAGOGASTRODUODENOSCOPY (EGD);  Surgeon: Alphonsa Overall, MD;  Location: [place];  Service: General;  Laterality: N/A;   ESOPHAGOGASTRODUODENOSCOPY N/A 08/16/2016   Procedure: ESOPHAGOGASTRODUODENOSCOPY (EGD);  Surgeon: Jerene Bears, MD;  Location: [place];  Service: Gastroenterology;  Laterality: N/A;   ESOPHAGOGASTRODUODENOSCOPY (EGD) WITH PROPOFOL N/A 07/05/2017   Procedure: ESOPHAGOGASTRODUODENOSCOPY (EGD) WITH PROPOFOL;  Surgeon: Milus Banister, MD;  Location: [facility] ENDOSCOPY;  Service: Endoscopy;  Laterality: N/A;  ESOPHAGOGASTRODUODENOSCOPY (EGD) WITH PROPOFOL N/A 05/25/2019   Procedure: ESOPHAGOGASTRODUODENOSCOPY (EGD) WITH PROPOFOL;  Surgeon: Doran Stabler, MD;  [facility];  Service: Gastroenterology;  Laterality: N/A;   HERNIA REPAIR  11/2014   w/lap band OR   JOINT REPLACEMENT     KNEE ARTHROSCOPY Left 2007   LAPAROSCOPIC GASTRIC BANDING N/A 11/12/2014   Procedure: LAPAROSCOPIC GASTRIC BANDING;  Surgeon: Alphonsa Overall, MD;  [facility] ORS;  Service: General;  Laterality: N/A;   PARATHYROIDECTOMY N/A 03/30/2013   Procedure: TOTAL PARATHYROIDECTOMY WITH AUTOTRANSPLANT;  Surgeon: Earnstine Regal, MD;   Location: [place];  Service: General;  Laterality: N/A;  Autotransplant to left lower arm.   TOTAL KNEE ARTHROPLASTY Right 03/27/2014   Procedure: UNICOMPARTMENTAL ARTHROPLASTY;  Surgeon: Meredith Pel, MD;  Location: [place];  Service: Orthopedics;  Laterality: Right;    Current Medications: Current Meds  Medication Sig   albuterol (PROVENTIL HFA;VENTOLIN HFA) 108 (90 Base) MCG/ACT inhaler Inhale 2 puffs into the lungs every 4 (four) hours as needed for wheezing or shortness of breath.   b complex-vitamin c-folic acid (NEPHRO-VITE) 0.8 MG TABS tablet Take 1 tablet by mouth daily.    calcium carbonate (TUMS EX) 750 MG chewable tablet Chew 4 tablets by mouth 3 (three) times daily.   famotidine (PEPCID) 20 MG tablet Take 1 tablet (20 mg total) by mouth daily.   meloxicam (MOBIC) 15 MG tablet Take 0.5-1 tablets (7.5-15 mg total) by mouth daily as needed for pain.   metoCLOPramide (REGLAN) 5 MG tablet Take 1 tablet (5 mg total) by mouth 2 (two) times daily as needed for nausea.   OZEMPIC, 0.25 OR 0.5 MG/DOSE, 2 MG/1.5ML SOPN Inject 0.5 mg into the skin once a week.   pantoprazole (PROTONIX) 40 MG tablet Take 1 tablet (40 mg total) by mouth daily. Take 30 minutes before breakfast.   sevelamer carbonate (RENVELA) 2.4 G PACK Take 2.4 g by mouth 3 (three) times daily with meals.   tiZANidine (ZANAFLEX) 4 MG tablet Take 4 mg by mouth every 8 (eight) hours as needed for muscle spasms.    traMADol (ULTRAM) 50 MG tablet Take 1 tablet (50 mg total) by mouth every 12 (twelve) hours as needed.    Allergies:   Dilaudid [hydromorphone hcl], Amlodipine, Ascorbate, and Midodrine   Social History   Socioeconomic History   Marital status: Single    Spouse name: Not on file   Number of children: 0   Years of education: Not on file   Highest education level: Not on file  Occupational History   Not on file  Tobacco Use   Smoking status: Former    Packs/day: 0.10    Years: 16.00    Total pack years: 1.60     Types: Cigarettes    Quit date: 01/17/2019    Years since quitting: 3.4   Smokeless tobacco: Never  Vaping Use   Vaping Use: Never used  Substance and Sexual Activity   Alcohol use: No    Alcohol/week: 0.0 standard drinks of alcohol   Drug use: No   Sexual activity: Yes  Other Topics Concern   Not on file  Social History Narrative   Not on file   Social Determinants of Health   Financial Resource Strain: Not on file  Food Insecurity: Not on file  Transportation Needs: Not on file  Physical Activity: Not on file  Stress: Not on file  Social Connections: Not on file     Family History:  The patient's family history includes Diabetes in his father and maternal grandmother; Hypertension in his mother; Kidney Stones in his sister; Liver cancer in his maternal uncle.   ROS:   Please see the history of present illness.    ROS All other systems reviewed and are negative.      No data to display           PHYSICAL EXAM:   VS:  BP (!) 92/54   Pulse 65   Ht 5\' 6"  (1.676 m)   Wt (!) 320 lb 3.2 oz (145.2 kg)   SpO2 95%   BMI 51.68 kg/m    GEN: Well nourished, well developed in no acute distress HEENT: Normal NECK: No JVD; No carotid bruits LYMPHATICS: No lymphadenopathy CARDIAC:RRR, no murmurs, rubs, gallops RESPIRATORY:  Clear to auscultation without rales, wheezing or rhonchi  ABDOMEN: Soft, non-tender, non-distended MUSCULOSKELETAL:  No edema; No deformity  SKIN: Warm and dry NEUROLOGIC:  Alert and oriented x 3 PSYCHIATRIC:  Normal affect  Wt Readings from Last 3 Encounters:  06/17/22 (!) 320 lb 3.2 oz (145.2 kg)  04/21/22 (!) 317 lb (143.8 kg)  12/01/21 (!) 328 lb 12.8 oz (149.1 kg)      Studies/Labs Reviewed:   EKG:  EKG is not ordered today.   Recent Labs: No results found for requested labs within last 365 days.   Lipid Panel No results found for: "CHOL", "TRIG", "HDL", "CHOLHDL", "VLDL", "LDLCALC", "LDLDIRECT"  Additional studies/ records that  were reviewed today include:  Office visit notes from PCP    ASSESSMENT:    1. Palpitations   2. Essential hypertension   3. OSA (obstructive sleep apnea)      PLAN:  In order of problems listed above:  Palpitations -by description these sound like PVCs or PACs. -Heart monitor showed no arrhythmias -he has some brief palpitations at work the other day but not bothersome -I encouraged him to look into getting a Kardia mobile device to try to catch his arrhythmias  2.  Hypertension -BP is well on exam -He is currently not on antihypertensive therapy  3.  OSA - The patient is tolerating PAP therapy well without any problems. The PAP download performed by his DME was personally reviewed and interpreted by me today and showed an AHI of 1.4 /hr on auto CPAP from 4-15 cm H2O with 67% compliance in using more than 4 hours nightly.  The patient has been using and benefiting from PAP use and will continue to benefit from therapy.  -I encouraged him to be more compliant with his device  Followup with me in 1 year   Time Spent: 20 minutes total time of encounter, including 15 minutes spent in face-to-face patient care on the date of this encounter. This time includes coordination of care and counseling regarding above mentioned problem list. Remainder of non-face-to-face time involved reviewing chart documents/testing relevant to the patient encounter and documentation in the medical record. I have independently reviewed documentation from referring provider  Medication Adjustments/Labs and Tests Ordered: Current medicines are reviewed at length with the patient today.  Concerns regarding medicines are outlined above.  Medication changes, Labs and Tests ordered today are listed in the Patient Instructions below.  There are no Patient Instructions on file for this visit.   Signed, Fransico Him, MD  06/17/2022 9:21 AM    [address], [place],  [place]  [address] Phone: [phone]; Fax: [phone]

## 2022-06-17 NOTE — Patient Instructions (Signed)
Kardia Mobile Device  Medication Instructions:  Your physician recommends that you continue on your current medications as directed. Please refer to the Current Medication list given to you today.  *If you need a refill on your cardiac medications before your next appointment, please call your pharmacy*  Follow-Up: At [facility], you and your health needs are our priority.  As part of our continuing mission to provide you with exceptional heart care, we have created designated Provider Care Teams.  These Care Teams include your primary Cardiologist (physician) and Advanced Practice Providers (APPs -  Physician Assistants and Nurse Practitioners) who all work together to provide you with the care you need, when you need it.  Your next appointment:   1 year(s)  The format for your next appointment:   In Person  Provider:   Fransico Him, MD      Important Information About Sugar

## 2022-07-20 ENCOUNTER — Telehealth: Payer: Self-pay

## 2022-07-20 NOTE — Telephone Encounter (Signed)
   Pre-operative Risk Assessment    Patient Name: [name]  DOB: 1986/04/22 MRN: [id number]     Request for Surgical Clearance    Procedure:  Bariatric Surgery (Gastric bypass or gastric sleeve)  Date of Surgery:  Clearance TBD                                 Surgeon:   Surgeon's Group or Practice Name:  [facility] faculty physicians  Phone number:  [phone] Fax number:  [phone]   Type of Clearance Requested:   - Medical    Type of Anesthesia:  General    Additional requests/questions:    Oneal Grout   07/20/2022, 4:22 PM

## 2022-07-21 NOTE — Telephone Encounter (Signed)
   Patient Name: [name]  DOB: [birth year] MRN: [id number]  Primary Cardiologist: Fransico Him, MD  Chart reviewed as part of pre-operative protocol coverage.  Attempted to contact patient to discuss surgical clearance request.  No answer.  Left message for patient to return call at earliest convenience.   Lenna Sciara, NP 07/21/2022, 10:44 AM

## 2022-07-24 NOTE — Telephone Encounter (Signed)
     Primary Cardiologist: Fransico Him, MD  Chart reviewed as part of pre-operative protocol coverage. Given past medical history and time since last visit, based on ACC/AHA guidelines, [name] would be at acceptable risk for the planned procedure without further cardiovascular testing.   His RCRI is a last 2 risk, 0.9% risk of major cardiac event.  I will route this recommendation to the requesting party via Epic fax function and remove from pre-op pool.  Please call with questions.  Jossie Ng. Laconda Basich NP-C     07/24/2022, 9:23 AM [address] Office [phone] Fax [phone]

## 2022-12-02 ENCOUNTER — Encounter: Payer: Self-pay | Admitting: Orthopedic Surgery

## 2022-12-02 ENCOUNTER — Ambulatory Visit (INDEPENDENT_AMBULATORY_CARE_PROVIDER_SITE_OTHER): Payer: 59 | Admitting: Orthopedic Surgery

## 2022-12-02 DIAGNOSIS — M1712 Unilateral primary osteoarthritis, left knee: Secondary | ICD-10-CM | POA: Diagnosis not present

## 2022-12-02 MED ORDER — TRAMADOL HCL 50 MG PO TABS: 50.0000 mg | ORAL_TABLET | Freq: Two times a day (BID) | ORAL | 0 refills | Status: DC | PRN

## 2022-12-02 NOTE — Progress Notes (Signed)
Office Visit Note   Patient: [name]           Date of Birth: May 19, 1986           MRN: [id number] Visit Date: 12/02/2022 Requested by: Fleet Contras, MD [address] [place],  [place] [address] PCP: Fleet Contras, MD  Subjective: Chief Complaint  Patient presents with   Left Knee - Pain    HPI: [name] is a 37 y.o. male who presents to the office reporting left knee pain.  Last had refill on Ultram for pain 04/29/2022.  There is old notes were reviewed.  He is ambulating with a cane.  Reports worsening pain in the left knee.  Right knee is doing reasonly well.  He is on dialysis.  Works at the [employer]..                ROS: All systems reviewed are negative as they relate to the chief complaint within the history of present illness.  Patient denies fevers or chills.  Assessment & Plan: Visit Diagnoses: No diagnosis found.  Plan: Impression is progressive left knee arthritis.  Patient's height and weight puts him at a BMI which is not compatible with elective knee replacement.  He would need to lose likely about 60 pounds to get into the range where he could have replacement.  Ultram refilled.  Continue with nonweightbearing quad strengthening exercises.  Follow-Up Instructions: No follow-ups on file.   Orders:  No orders of the defined types were placed in this encounter.  Meds ordered this encounter  Medications   traMADol (ULTRAM) 50 MG tablet    Sig: Take 1 tablet (50 mg total) by mouth every 12 (twelve) hours as needed.    Dispense:  30 tablet    Refill:  0      Procedures: No procedures performed   Clinical Data: No additional findings.  Objective: Vital Signs: There were no vitals taken for this visit.  Physical Exam:  Constitutional: Patient appears well-developed HEENT:  Head: Normocephalic Eyes:EOM are normal Neck: Normal range of motion Cardiovascular: Normal rate Pulmonary/chest: Effort normal Neurologic: Patient is  alert Skin: Skin is warm Psychiatric: Patient has normal mood and affect  Ortho Exam: Ortho exam demonstrates trace effusion in the left knee.  Valgus alignment is present.  Partially correctable.  Range of motion is 0-1 10.  Extensor mechanism intact.  Pedal pulses palpable.  Specialty Comments:  No specialty comments available.  Imaging: No results found.   PMFS History: Patient Active Problem List   Diagnosis Date Noted   Abdominal pain 07/04/2017   Gastritis and gastroduodenitis 11/19/2016   Increased anion gap metabolic acidosis 11/19/2016   Intractable abdominal pain    Common bile duct dilatation    Cholelithiasis without cholecystitis    Intractable nausea and vomiting 10/08/2016   Hematemesis 08/14/2016   GERD (gastroesophageal reflux disease) 08/14/2016   Essential hypertension 08/14/2016   GIB (gastrointestinal bleeding) 08/14/2016   SIRS (systemic inflammatory response syndrome) (HCC) 08/14/2016   CKD (chronic kidney disease) stage V requiring chronic dialysis (HCC) 06/11/2016   Non-intractable vomiting with nausea 06/11/2016   Hepatic steatosis 06/11/2016   Cholelithiasis 06/11/2016   Localized osteoarthritis of left knee 07/11/2015   Abdominal pain, epigastric 12/05/2014   Lapband April 2016 11/27/2014   Arthritis of knee 03/27/2014   Morbid obesity, weight 291, BMI - 47 02/15/2014   Hyperparathyroidism, secondary (HCC) 03/14/2013   Past Medical History:  Diagnosis Date   Arthritis    "  all over" (11/19/2016)   Chronic lower back pain    Complication of anesthesia    A little while to wake up after knee surgery in 2008   Dizziness    when coming off of dialysis   ESRD (end stage renal disease) on dialysis [facility])    MWF and goes to [employer] (11/19/2016)   Family history of anesthesia complication    mom slow to wake up   GERD (gastroesophageal reflux disease)    takes Omeprazole as needed   Headache(784.0)    "monthly" (11/19/2016)   History of  hiatal hernia    Hyperparathyroidism (HCC)    Hypertension    Joint pain    Joint swelling    Morbid obesity (HCC)    PONV (postoperative nausea and vomiting)    Thyroid disease     Family History  Problem Relation Age of Onset   Hypertension Mother    Diabetes Father    Kidney Stones Sister    Diabetes Maternal Grandmother    Liver cancer Maternal Uncle    Colon cancer Neg Hx    Colon polyps Neg Hx    Esophageal cancer Neg Hx    Rectal cancer Neg Hx    Stomach cancer Neg Hx     Past Surgical History:  Procedure Laterality Date   AV FISTULA PLACEMENT Bilateral 2005,2007   "right; left"   AV FISTULA REPAIR Right 2007   "tried to clean it out; couldn't do it"   BOTOX INJECTION N/A 05/25/2019   Procedure: BOTOX INJECTION;  Surgeon: Sherrilyn Rist, MD;  [facility]OS[facility];  Service: Gastroenterology;  Laterality: N/A;   BREATH TEK H PYLORI N/A 05/15/2014   Procedure: BREATH TEK H PYLORI;  Surgeon: Ovidio Kin, MD;  Location: [facility] ENDOSCOPY;  Service: General;  Laterality: N/A;   CHOLECYSTECTOMY N/A 11/23/2016   Procedure: LAPAROSCOPIC CHOLECYSTECTOMY WITH INTRAOPERATIVE CHOLANGIOGRAM;  Surgeon: Harriette Bouillon, MD;  Location: [facility] OR;  Service: General;  Laterality: N/A;   ESOPHAGOGASTRODUODENOSCOPY N/A 12/07/2014   Procedure: ESOPHAGOGASTRODUODENOSCOPY (EGD);  Surgeon: Ovidio Kin, MD;  Location: [facility] ENDOSCOPY;  Service: General;  Laterality: N/A;   ESOPHAGOGASTRODUODENOSCOPY N/A 12/17/2014   Procedure: ESOPHAGOGASTRODUODENOSCOPY (EGD);  Surgeon: Ovidio Kin, MD;  Location: [facility] OR;  Service: General;  Laterality: N/A;   ESOPHAGOGASTRODUODENOSCOPY N/A 08/16/2016   Procedure: ESOPHAGOGASTRODUODENOSCOPY (EGD);  Surgeon: Beverley Fiedler, MD;  Location: [facility] ENDOSCOPY;  Service: Gastroenterology;  Laterality: N/A;   ESOPHAGOGASTRODUODENOSCOPY (EGD) WITH PROPOFOL N/A 07/05/2017   Procedure: ESOPHAGOGASTRODUODENOSCOPY (EGD) WITH PROPOFOL;  Surgeon: Rachael Fee, MD;  Location: [facility]  ENDOSCOPY;  Service: Endoscopy;  Laterality: N/A;   ESOPHAGOGASTRODUODENOSCOPY (EGD) WITH PROPOFOL N/A 05/25/2019   Procedure: ESOPHAGOGASTRODUODENOSCOPY (EGD) WITH PROPOFOL;  Surgeon: Sherrilyn Rist, MD;  [facility];  Service: Gastroenterology;  Laterality: N/A;   HERNIA REPAIR  11/2014   w/lap band OR   JOINT REPLACEMENT     KNEE ARTHROSCOPY Left 2007   LAPAROSCOPIC GASTRIC BANDING N/A 11/12/2014   Procedure: LAPAROSCOPIC GASTRIC BANDING;  Surgeon: Ovidio Kin, MD;  [facility];  Service: General;  Laterality: N/A;   PARATHYROIDECTOMY N/A 03/30/2013   Procedure: TOTAL PARATHYROIDECTOMY WITH AUTOTRANSPLANT;  Surgeon: Velora Heckler, MD;  Location: [facility] OR;  Service: General;  Laterality: N/A;  Autotransplant to left lower arm.   TOTAL KNEE ARTHROPLASTY Right 03/27/2014   Procedure: UNICOMPARTMENTAL ARTHROPLASTY;  Surgeon: [name], MD;  Location: [facility] OR;  Service: Orthopedics;  Laterality: Right;   Social History  Occupational History   Not on file  Tobacco Use   Smoking status: Former    Packs/day: 0.10    Years: 16.00    Additional pack years: 0.00    Total pack years: 1.60    Types: Cigarettes    Quit date: 01/17/2019    Years since quitting: 3.8   Smokeless tobacco: Never  Vaping Use   Vaping Use: Never used  Substance and Sexual Activity   Alcohol use: No    Alcohol/week: 0.0 standard drinks of alcohol   Drug use: No   Sexual activity: Yes

## 2022-12-22 ENCOUNTER — Telehealth: Payer: Self-pay

## 2022-12-22 NOTE — Telephone Encounter (Signed)
   Pre-operative Risk Assessment    Patient Name: [name]  DOB: 1986-03-28 MRN: [id number]      Request for Surgical Clearance    Procedure:   GASTRIC BYPASS OR GASTRIC SLEEVE   Date of Surgery:  Clearance TBD                                 Surgeon:  Elsworth Soho  Surgeon's Group or Practice Name:  [facility]  Phone number:  [phone] Fax number:  [phone]   Type of Clearance Requested:   - Medical    Type of Anesthesia:  General    Additional requests/questions:    SignedMichaelle Copas   12/22/2022, 4:13 PM

## 2022-12-22 NOTE — Telephone Encounter (Signed)
Spoke with patient who is agreeable to do a tele visit on 6/6 at 2:40 pm. Med rec and consent done.

## 2022-12-22 NOTE — Telephone Encounter (Signed)
   Name: [name]  DOB: May 07, 1986  MRN: [id number]  Primary Cardiologist: Armanda Magic, MD   Preoperative team, please contact this patient and set up a phone call appointment for further preoperative risk assessment. Please obtain consent and complete medication review. Thank you for your help.  I confirm that guidance regarding antiplatelet and oral anticoagulation therapy has been completed and, if necessary, noted below (none requested).    Joylene Grapes, NP 12/22/2022, 4:33 PM [facility]

## 2022-12-22 NOTE — Telephone Encounter (Signed)
  Patient Consent for Virtual Visit        [name] has provided verbal consent on 12/22/2022 for a virtual visit (video or telephone).   CONSENT FOR VIRTUAL VISIT FOR:  [name]  By participating in this virtual visit I agree to the following:  I hereby voluntarily request, consent and authorize [facility] and its employed or contracted physicians, physician assistants, nurse practitioners or other licensed health care professionals (the Practitioner), to provide me with telemedicine health care services (the "Services") as deemed necessary by the treating Practitioner. I acknowledge and consent to receive the Services by the Practitioner via telemedicine. I understand that the telemedicine visit will involve communicating with the Practitioner through live audiovisual communication technology and the disclosure of certain medical information by electronic transmission. I acknowledge that I have been given the opportunity to request an in-person assessment or other available alternative prior to the telemedicine visit and am voluntarily participating in the telemedicine visit.  I understand that I have the right to withhold or withdraw my consent to the use of telemedicine in the course of my care at any time, without affecting my right to future care or treatment, and that the Practitioner or I may terminate the telemedicine visit at any time. I understand that I have the right to inspect all information obtained and/or recorded in the course of the telemedicine visit and may receive copies of available information for a reasonable fee.  I understand that some of the potential risks of receiving the Services via telemedicine include:  Delay or interruption in medical evaluation due to technological equipment failure or disruption; Information transmitted may not be sufficient (e.g. poor resolution of images) to allow for appropriate medical decision making by the Practitioner;  and/or  In rare instances, security protocols could fail, causing a breach of personal health information.  Furthermore, I acknowledge that it is my responsibility to provide information about my medical history, conditions and care that is complete and accurate to the best of my ability. I acknowledge that Practitioner's advice, recommendations, and/or decision may be based on factors not within their control, such as incomplete or inaccurate data provided by me or distortions of diagnostic images or specimens that may result from electronic transmissions. I understand that the practice of medicine is not an exact science and that Practitioner makes no warranties or guarantees regarding treatment outcomes. I acknowledge that a copy of this consent can be made available to me via my patient portal [facility] MyChart), or I can request a printed copy by calling the office of [facility].    I understand that my insurance will be billed for this visit.   I have read or had this consent read to me. I understand the contents of this consent, which adequately explains the benefits and risks of the Services being provided via telemedicine.  I have been provided ample opportunity to ask questions regarding this consent and the Services and have had my questions answered to my satisfaction. I give my informed consent for the services to be provided through the use of telemedicine in my medical care

## 2022-12-25 ENCOUNTER — Other Ambulatory Visit: Payer: Self-pay | Admitting: *Deleted

## 2022-12-25 DIAGNOSIS — Z992 Dependence on renal dialysis: Secondary | ICD-10-CM

## 2022-12-31 ENCOUNTER — Encounter (HOSPITAL_COMMUNITY): Payer: 59

## 2023-01-06 ENCOUNTER — Encounter: Payer: 59 | Admitting: Vascular Surgery

## 2023-01-07 ENCOUNTER — Ambulatory Visit: Payer: 59 | Attending: Internal Medicine

## 2023-01-07 DIAGNOSIS — Z01810 Encounter for preprocedural cardiovascular examination: Secondary | ICD-10-CM | POA: Diagnosis not present

## 2023-01-07 NOTE — Progress Notes (Signed)
Virtual Visit via Telephone Note   Because of SANTO HITT co-morbid illnesses, he is at least at moderate risk for complications without adequate follow up.  This format is felt to be most appropriate for this patient at this time.  The patient did not have access to video technology/had technical difficulties with video requiring transitioning to audio format only (telephone).  All issues noted in this document were discussed and addressed.  No physical exam could be performed with this format.  Please refer to the patient's chart for his consent to telehealth for [facility].  Evaluation Performed:  Preoperative cardiovascular risk assessment _____________   Date:  01/07/2023   Patient ID:  [name], DOB 07/29/1986, MRN [id number] Patient Location:  Home Provider location:   Office  Primary Care Provider:  Fleet Contras, MD Primary Cardiologist:  Armanda Magic, MD  Chief Complaint / Patient Profile   37 y.o. y/o male with a h/o hypertension, GERD, end-stage renal disease related to hypertension on HD, history of palpitations who is pending gastric bypass or gastric sleeve and presents today for telephonic preoperative cardiovascular risk assessment.  History of Present Illness    [name] is a 37 y.o. male who presents via audio/video conferencing for a telehealth visit today.  Pt was last seen in cardiology clinic on 06/17/2022 by Dr. Rennie Plowman.  At that time [name] was doing well.  The patient is now pending procedure as outlined above. Since his last visit, he tells me that he has not had any chest pains, palpitations, or shortness of breath.  He thinks that his chest pain in the fall was related to his palpitations.  He states that his blood pressure is a lot more stable.  He has not had any blood pressures under 90 systolic.  It does drop a little bit at HD.  No dizziness or lightheadedness.  He has scored above the 4 METS on the DASI.  There are no  medications indicated as needing held.  Reports no shortness of breath nor dyspnea on exertion. Reports no chest pain, pressure, or tightness. No edema, orthopnea, PND. Reports no palpitations.  Past Medical History    Past Medical History:  Diagnosis Date   Arthritis    "all over" (11/19/2016)   Chronic lower back pain    Complication of anesthesia    A little while to wake up after knee surgery in 2008   Dizziness    when coming off of dialysis   ESRD (end stage renal disease) on dialysis [facility])    MWF and goes to [employer] [facility] (11/19/2016)   Family history of anesthesia complication    mom slow to wake up   GERD (gastroesophageal reflux disease)    takes Omeprazole as needed   Headache(784.0)    "monthly" (11/19/2016)   History of hiatal hernia    Hyperparathyroidism (HCC)    Hypertension    Joint pain    Joint swelling    Morbid obesity (HCC)    PONV (postoperative nausea and vomiting)    Thyroid disease    Past Surgical History:  Procedure Laterality Date   AV FISTULA PLACEMENT Bilateral 2005,2007   "right; left"   AV FISTULA REPAIR Right 2007   "tried to clean it out; couldn't do it"   BOTOX INJECTION N/A 05/25/2019   Procedure: BOTOX INJECTION;  Surgeon: Sherrilyn Rist, MD;  Location: [facility] ENDOSCOPY;  Service: Gastroenterology;  Laterality: N/A;   BREATH TEK  H PYLORI N/A 05/15/2014   Procedure: BREATH TEK H PYLORI;  Surgeon: Ovidio Kin, MD;  Location: [facility] ENDOSCOPY;  Service: General;  Laterality: N/A;   CHOLECYSTECTOMY N/A 11/23/2016   Procedure: LAPAROSCOPIC CHOLECYSTECTOMY WITH INTRAOPERATIVE CHOLANGIOGRAM;  Surgeon: Harriette Bouillon, MD;  Location: [facility] OR;  Service: General;  Laterality: N/A;   ESOPHAGOGASTRODUODENOSCOPY N/A 12/07/2014   Procedure: ESOPHAGOGASTRODUODENOSCOPY (EGD);  Surgeon: Ovidio Kin, MD;  Location: [facility] ENDOSCOPY;  Service: General;  Laterality: N/A;   ESOPHAGOGASTRODUODENOSCOPY N/A 12/17/2014   Procedure: ESOPHAGOGASTRODUODENOSCOPY (EGD);  Surgeon:  Ovidio Kin, MD;  [facility] OR;  Service: General;  Laterality: N/A;   ESOPHAGOGASTRODUODENOSCOPY N/A 08/16/2016   Procedure: ESOPHAGOGASTRODUODENOSCOPY (EGD);  Surgeon: Beverley Fiedler, MD;  Location: [facility] ENDOSCOPY;  Service: Gastroenterology;  Laterality: N/A;   ESOPHAGOGASTRODUODENOSCOPY (EGD) WITH PROPOFOL N/A 07/05/2017   Procedure: ESOPHAGOGASTRODUODENOSCOPY (EGD) WITH PROPOFOL;  Surgeon: Rachael Fee, MD;  Location: [facility] ENDOSCOPY;  Service: Endoscopy;  Laterality: N/A;   ESOPHAGOGASTRODUODENOSCOPY (EGD) WITH PROPOFOL N/A 05/25/2019   Procedure: ESOPHAGOGASTRODUODENOSCOPY (EGD) WITH PROPOFOL;  Surgeon: Sherrilyn Rist, MD;  Location: [facility] ENDOSCOPY;  Service: Gastroenterology;  Laterality: N/A;   HERNIA REPAIR  11/2014   w/lap band OR   JOINT REPLACEMENT     KNEE ARTHROSCOPY Left 2007   LAPAROSCOPIC GASTRIC BANDING N/A 11/12/2014   Procedure: LAPAROSCOPIC GASTRIC BANDING;  Surgeon: Ovidio Kin, MD;  Location: [facility] ORS;  Service: General;  Laterality: N/A;   PARATHYROIDECTOMY N/A 03/30/2013   Procedure: TOTAL PARATHYROIDECTOMY WITH AUTOTRANSPLANT;  Surgeon: Velora Heckler, MD;  Location: [facility] OR;  Service: General;  Laterality: N/A;  Autotransplant to left lower arm.   TOTAL KNEE ARTHROPLASTY Right 03/27/2014   Procedure: UNICOMPARTMENTAL ARTHROPLASTY;  Surgeon: [name], MD;  Location: [facility] OR;  Service: Orthopedics;  Laterality: Right;    Allergies  Allergies  Allergen Reactions   Dilaudid [Hydromorphone Hcl] Other (See Comments)    Patient became hypoxic to 60% with dilaudid 1mg  IV   Amlodipine Other (See Comments)   Ascorbate Itching   Midodrine Itching    Home Medications    Prior to Admission medications   Medication Sig Start Date End Date Taking? Authorizing Provider  albuterol (PROVENTIL HFA;VENTOLIN HFA) 108 (90 Base) MCG/ACT inhaler Inhale 2 puffs into the lungs every 4 (four) hours as needed for wheezing or shortness of breath. 05/14/16   [name], [name]   b complex-vitamin c-folic acid (NEPHRO-VITE) 0.8 MG TABS tablet Take 1 tablet by mouth daily.  08/16/16   [provider]  calcium carbonate (TUMS EX) 750 MG chewable tablet Chew 4 tablets by mouth 3 (three) times daily.    [provider]  famotidine (PEPCID) 20 MG tablet Take 1 tablet (20 mg total) by mouth daily. 04/27/19   Bill Salinas, [name]  meloxicam (MOBIC) 15 MG tablet Take 0.5-1 tablets (7.5-15 mg total) by mouth daily as needed for pain. 08/01/19   [name], [name], MD  metoCLOPramide (REGLAN) 5 MG tablet Take 1 tablet (5 mg total) by mouth 2 (two) times daily as needed for nausea. 04/27/19   [name], [name]  OZEMPIC, 0.25 OR 0.5 MG/DOSE, 2 MG/1.5ML SOPN Inject 0.5 mg into the skin once a week. 05/17/21   [provider]  pantoprazole (PROTONIX) 40 MG tablet Take 1 tablet (40 mg total) by mouth daily. Take 30 minutes before breakfast. 05/08/19   Unk [name], PA  sevelamer carbonate (RENVELA) 2.4 G PACK Take 2.4 g by mouth 3 (three) times daily with  meals.    [provider]  tiZANidine (ZANAFLEX) 4 MG tablet Take 4 mg by mouth every 8 (eight) hours as needed for muscle spasms.  01/11/18   [provider]  traMADol (ULTRAM) 50 MG tablet Take 1 tablet (50 mg total) by mouth every 12 (twelve) hours as needed. 12/02/22   [name], MD    Physical Exam    Vital Signs:  Celedonio Miyamoto does not have vital signs available for review today.  Given telephonic nature of communication, physical exam is limited. AAOx3. NAD. Normal affect.  Speech and respirations are unlabored.  Accessory Clinical Findings    None  Assessment & Plan    1.  Preoperative Cardiovascular Risk Assessment: Mr. Jasso perioperative risk of a major cardiac event is 6.6% according to the Revised Cardiac Risk Index (RCRI).  Therefore, he is at high risk for perioperative complications.   His functional capacity is excellent at 7.59 METs according  to the [facility] Activity Status Index (DASI). Recommendations: According to ACC/AHA guidelines, no further cardiovascular testing needed.  The patient may proceed to surgery at acceptable risk.    The patient was advised that if he develops new symptoms prior to surgery to contact our office to arrange for a follow-up visit, and he verbalized understanding.  A copy of this note will be routed to requesting surgeon.  Time:   Today, I have spent 5 minutes with the patient with telehealth technology discussing medical history, symptoms, and management plan.     Sharlene Dory, [name]  01/07/2023, 2:45 PM

## 2023-01-25 NOTE — Progress Notes (Unsigned)
[facility] [place]  ASSESSMENT / PLAN: 37 y.o. male with left shoulder pain.  This does not appear to be related to his left radiocephalic fistula which has been used for many years.  I suspect he has orthopedic issues, as he described temporary relief with steroid injection at [facility].  He does have to keep his shoulder and a somewhat stable position during dialysis, and this may exacerbate musculoskeletal issues.  I will see him again on an as-needed basis.  CHIEF COMPLAINT: left shoulder pain ; left radiocephalic fistula  HISTORY OF PRESENT ILLNESS: [name] is a 37 y.o. male who presents to clinic for evaluation of left shoulder pain.  The patient has a long history of end-stage renal disease, dialyzing through a left upper extremity radiocephalic fistula which is been present for many years.  The fistula is working well for him.  He has point tenderness at the shoulder and points to the acromion.  He has difficulty with leaving the shoulder immobile during dialysis sessions, as this exacerbates his shoulder discomfort.  Past Medical History:  Diagnosis Date   Arthritis    "all over" (11/19/2016)   Chronic lower back pain    Complication of anesthesia    A little while to wake up after knee surgery in 2008   Dizziness    when coming off of dialysis   ESRD (end stage renal disease) on dialysis [facility])    MWF and goes to [employer] (11/19/2016)   Family history of anesthesia complication    mom slow to wake up   GERD (gastroesophageal reflux disease)    takes Omeprazole as needed   Headache(784.0)    "monthly" (11/19/2016)   History of hiatal hernia    Hyperparathyroidism (HCC)    Hypertension    Joint pain    Joint swelling    Morbid obesity (HCC)    PONV (postoperative nausea and vomiting)    Thyroid disease     Past Surgical History:  Procedure Laterality Date   AV FISTULA PLACEMENT Bilateral 2005,2007   "right; left"   AV FISTULA REPAIR  Right 2007   "tried to clean it out; couldn't do it"   BOTOX INJECTION N/A 05/25/2019   Procedure: BOTOX INJECTION;  Surgeon: Sherrilyn Rist, MD;  Location: [facility] ENDOSCOPY;  Service: Gastroenterology;  Laterality: N/A;   BREATH TEK H PYLORI N/A 05/15/2014   Procedure: BREATH TEK H PYLORI;  Surgeon: Ovidio Kin, MD;  Location: [facility] ENDOSCOPY;  Service: General;  Laterality: N/A;   CHOLECYSTECTOMY N/A 11/23/2016   Procedure: LAPAROSCOPIC CHOLECYSTECTOMY WITH INTRAOPERATIVE CHOLANGIOGRAM;  Surgeon: Harriette Bouillon, MD;  Location: [facility] OR;  Service: General;  Laterality: N/A;   ESOPHAGOGASTRODUODENOSCOPY N/A 12/07/2014   Procedure: ESOPHAGOGASTRODUODENOSCOPY (EGD);  Surgeon: Ovidio Kin, MD;  Location: [facility] ENDOSCOPY;  Service: General;  Laterality: N/A;   ESOPHAGOGASTRODUODENOSCOPY N/A 12/17/2014   Procedure: ESOPHAGOGASTRODUODENOSCOPY (EGD);  Surgeon: Ovidio Kin, MD;  Location: [facility] OR;  Service: General;  Laterality: N/A;   ESOPHAGOGASTRODUODENOSCOPY N/A 08/16/2016   Procedure: ESOPHAGOGASTRODUODENOSCOPY (EGD);  Surgeon: Beverley Fiedler, MD;  Location: [facility] ENDOSCOPY;  Service: Gastroenterology;  Laterality: N/A;   ESOPHAGOGASTRODUODENOSCOPY (EGD) WITH PROPOFOL N/A 07/05/2017   Procedure: ESOPHAGOGASTRODUODENOSCOPY (EGD) WITH PROPOFOL;  Surgeon: Rachael Fee, MD;  Location: [facility] ENDOSCOPY;  Service: Endoscopy;  Laterality: N/A;   ESOPHAGOGASTRODUODENOSCOPY (EGD) WITH PROPOFOL N/A 05/25/2019   Procedure: ESOPHAGOGASTRODUODENOSCOPY (EGD) WITH PROPOFOL;  Surgeon: Sherrilyn Rist, MD;  Location: [facility] ENDOSCOPY;  Service: Gastroenterology;  Laterality:  N/A;   HERNIA REPAIR  11/2014   w/lap band OR   JOINT REPLACEMENT     KNEE ARTHROSCOPY Left 2007   LAPAROSCOPIC GASTRIC BANDING N/A 11/12/2014   Procedure: LAPAROSCOPIC GASTRIC BANDING;  Surgeon: Ovidio Kin, MD;  Location: [facility] ORS;  Service: General;  Laterality: N/A;   PARATHYROIDECTOMY N/A 03/30/2013   Procedure: TOTAL PARATHYROIDECTOMY WITH AUTOTRANSPLANT;   Surgeon: Velora Heckler, MD;  Location[facility] OR;  Service: General;  Laterality: N/A;  Autotransplant to left lower arm.   TOTAL KNEE ARTHROPLASTY Right 03/27/2014   Procedure: UNICOMPARTMENTAL ARTHROPLASTY;  Surgeon: [name], MD;  Location: [facility] OR;  Service: Orthopedics;  Laterality: Right;    Family History  Problem Relation Age of Onset   Hypertension Mother    Diabetes Father    Kidney Stones Sister    Diabetes Maternal Grandmother    Liver cancer Maternal Uncle    Colon cancer Neg Hx    Colon polyps Neg Hx    Esophageal cancer Neg Hx    Rectal cancer Neg Hx    Stomach cancer Neg Hx     Social History   Socioeconomic History   Marital status: Single    Spouse name: Not on file   Number of children: 0   Years of education: Not on file   Highest education level: Not on file  Occupational History   Not on file  Tobacco Use   Smoking status: Former    Packs/day: 0.10    Years: 16.00    Additional pack years: 0.00    Total pack years: 1.60    Types: Cigarettes    Quit date: 01/17/2019    Years since quitting: 4.0   Smokeless tobacco: Never  Vaping Use   Vaping Use: Never used  Substance and Sexual Activity   Alcohol use: No    Alcohol/week: 0.0 standard drinks of alcohol   Drug use: No   Sexual activity: Yes  Other Topics Concern   Not on file  Social History Narrative   Not on file   Social Determinants of Health   Financial Resource Strain: Not on file  Food Insecurity: Not on file  Transportation Needs: Not on file  Physical Activity: Not on file  Stress: Not on file  Social Connections: Not on file  Intimate Partner Violence: Not on file    Allergies  Allergen Reactions   Dilaudid [Hydromorphone Hcl] Other (See Comments)    Patient became hypoxic to 60% with dilaudid 1mg  IV   Amlodipine Other (See Comments)   Ascorbate Itching   Midodrine Itching    Current Outpatient Medications  Medication Sig Dispense Refill   albuterol (PROVENTIL  HFA;VENTOLIN HFA) 108 (90 Base) MCG/ACT inhaler Inhale 2 puffs into the lungs every 4 (four) hours as needed for wheezing or shortness of breath. 1 Inhaler 0   b complex-vitamin c-folic acid (NEPHRO-VITE) 0.8 MG TABS tablet Take 1 tablet by mouth daily.      calcium carbonate (TUMS EX) 750 MG chewable tablet Chew 4 tablets by mouth 3 (three) times daily.     famotidine (PEPCID) 20 MG tablet Take 1 tablet (20 mg total) by mouth daily. 30 tablet 0   meloxicam (MOBIC) 15 MG tablet Take 0.5-1 tablets (7.5-15 mg total) by mouth daily as needed for pain. 30 tablet 6   metoCLOPramide (REGLAN) 5 MG tablet Take 1 tablet (5 mg total) by mouth 2 (two) times daily as needed for nausea. 10 tablet 0   OZEMPIC,  0.25 OR 0.5 MG/DOSE, 2 MG/1.5ML SOPN Inject 0.5 mg into the skin once a week.     pantoprazole (PROTONIX) 40 MG tablet Take 1 tablet (40 mg total) by mouth daily. Take 30 minutes before breakfast. 30 tablet 3   sevelamer carbonate (RENVELA) 2.4 G PACK Take 2.4 g by mouth 3 (three) times daily with meals.     tiZANidine (ZANAFLEX) 4 MG tablet Take 4 mg by mouth every 8 (eight) hours as needed for muscle spasms.      traMADol (ULTRAM) 50 MG tablet Take 1 tablet (50 mg total) by mouth every 12 (twelve) hours as needed. 30 tablet 0   No current facility-administered medications for this visit.    PHYSICAL EXAM Vitals:   01/26/23 0840  BP: (!) 89/59  Pulse: 65  Resp: 20  Temp: 97.8 F (36.6 C)  SpO2: 97%  Weight: (!) 306 lb (138.8 kg)  Height: 5\' 6"  (1.676 m)   Obese man in no acute distress Regular rate and rhythm Unlabored breathing Left upper extremity radiocephalic fistula with 2 areas of pseudoaneurysm change.  Brisk thrill. Significant left shoulder pain with active and passive range of motion through abduction and internal and external rotation.   PERTINENT LABORATORY AND RADIOLOGIC DATA  Most recent CBC    Latest Ref Rng & Units 11/12/2020    8:43 PM 05/25/2019    6:57 AM 04/29/2019    11:49 AM  CBC  WBC 4.0 - 10.5 K/uL 15.3   11.8   Hemoglobin 13.0 - 17.0 g/dL 04.5  40.9  81.1   Hematocrit 39.0 - 52.0 % 38.5  36.0  38.7   Platelets 150 - 400 K/uL 228   270      Most recent CMP    Latest Ref Rng & Units 11/12/2020    8:43 PM 05/25/2019    6:57 AM 04/29/2019   11:49 AM  CMP  Glucose 70 - 99 mg/dL 914  94  782   BUN 6 - 20 mg/dL 85  41  18   Creatinine 0.61 - 1.24 mg/dL 95.62  1.30  8.65   Sodium 135 - 145 mmol/L 134  136  135   Potassium 3.5 - 5.1 mmol/L 4.2  4.0  3.0   Chloride 98 - 111 mmol/L 94  98  93   CO2 22 - 32 mmol/L 22   24   Calcium 8.9 - 10.3 mg/dL 8.4   78.4   Total Protein 6.5 - 8.1 g/dL 8.0   8.2   Total Bilirubin 0.3 - 1.2 mg/dL 1.0   0.8   Alkaline Phos 38 - 126 U/L 47   47   AST 15 - 41 U/L 14   17   ALT 0 - 44 U/L 16   24    Patent radial cephalic AVF  Arteriovenous graft-Velocities of less than 100cm/s noted.  Arteriovenous graft-Aneurysmal dilatation is noted.    Rande Brunt. Lenell Antu, MD FACS [facility] Phone Number: [phone] 01/26/2023 4:09 PM   Total time spent on preparing this encounter including chart review, data review, collecting history, examining the patient, coordinating care for this new patient, 45 minutes.  Portions of this report may have been transcribed using voice recognition software.  Every effort has been made to ensure accuracy; however, inadvertent computerized transcription errors may still be present.

## 2023-01-26 ENCOUNTER — Ambulatory Visit (INDEPENDENT_AMBULATORY_CARE_PROVIDER_SITE_OTHER): Payer: 59 | Admitting: Vascular Surgery

## 2023-01-26 ENCOUNTER — Ambulatory Visit (HOSPITAL_COMMUNITY)
Admission: RE | Admit: 2023-01-26 | Discharge: 2023-01-26 | Disposition: A | Discharge status: Home / Self Care | Payer: 59 | Source: Ambulatory Visit | Attending: Vascular Surgery | Admitting: Vascular Surgery

## 2023-01-26 ENCOUNTER — Encounter: Payer: Self-pay | Admitting: Vascular Surgery

## 2023-01-26 VITALS — BP 89/59 | HR 65 | Temp 97.8°F | Resp 20 | Ht 66.0 in | Wt 306.0 lb

## 2023-01-26 DIAGNOSIS — Z992 Dependence on renal dialysis: Secondary | ICD-10-CM | POA: Insufficient documentation

## 2023-01-26 DIAGNOSIS — N186 End stage renal disease: Secondary | ICD-10-CM | POA: Diagnosis present

## 2023-01-26 DIAGNOSIS — G8929 Other chronic pain: Secondary | ICD-10-CM

## 2023-01-26 DIAGNOSIS — M25512 Pain in left shoulder: Secondary | ICD-10-CM | POA: Diagnosis not present

## 2023-03-22 ENCOUNTER — Other Ambulatory Visit: Payer: Self-pay | Admitting: Internal Medicine

## 2023-03-24 LAB — COMPLETE METABOLIC PANEL WITH GFR
AG Ratio: 1.5 (calc) (ref 1.0–2.5)
ALT: 13 U/L (ref 9–46)
AST: 13 U/L (ref 10–40)
Albumin: 4.2 g/dL (ref 3.6–5.1)
Alkaline phosphatase (APISO): 49 U/L (ref 36–130)
BUN/Creatinine Ratio: 2 (calc) — ABNORMAL LOW (ref 6–22)
BUN: 14 mg/dL (ref 7–25)
CO2: 26 mmol/L (ref 20–32)
Calcium: 8.5 mg/dL — ABNORMAL LOW (ref 8.6–10.3)
Chloride: 95 mmol/L — ABNORMAL LOW (ref 98–110)
Creat: 5.63 mg/dL — ABNORMAL HIGH (ref 0.60–1.26)
Globulin: 2.8 g/dL (ref 1.9–3.7)
Glucose, Bld: 70 mg/dL (ref 65–99)
Potassium: 3.2 mmol/L — ABNORMAL LOW (ref 3.5–5.3)
Sodium: 140 mmol/L (ref 135–146)
Total Bilirubin: 0.8 mg/dL (ref 0.2–1.2)
Total Protein: 7 g/dL (ref 6.1–8.1)
eGFR: 12 mL/min/{1.73_m2} — ABNORMAL LOW (ref >=60)

## 2023-03-24 LAB — CBC
HCT: 30.9 % — ABNORMAL LOW (ref 38.5–50.0)
Hemoglobin: 10.8 g/dL — ABNORMAL LOW (ref 13.2–17.1)
MCH: 30.3 pg (ref 27.0–33.0)
MCHC: 35 g/dL (ref 32.0–36.0)
MCV: 86.8 fL (ref 80.0–100.0)
MPV: 10.7 fL (ref 7.5–12.5)
Platelets: 305 10*3/uL (ref 140–400)
RBC: 3.56 10*6/uL — ABNORMAL LOW (ref 4.20–5.80)
RDW: 13.9 % (ref 11.0–15.0)
WBC: 9.1 10*3/uL (ref 3.8–10.8)

## 2023-03-24 LAB — LIPID PANEL
Cholesterol: 153 mg/dL (ref <200)
HDL: 41 mg/dL (ref >=40)
LDL Cholesterol (Calc): 82 mg/dL
Non-HDL Cholesterol (Calc): 112 mg/dL (ref <130)
Total CHOL/HDL Ratio: 3.7 (calc) (ref <5.0)
Triglycerides: 206 mg/dL — ABNORMAL HIGH (ref <150)

## 2023-03-24 LAB — T3: T3, Total: 90 ng/dL (ref 76–181)

## 2023-03-24 LAB — VITAMIN D 25 HYDROXY (VIT D DEFICIENCY, FRACTURES): Vit D, 25-Hydroxy: 23 ng/mL — ABNORMAL LOW (ref 30–100)

## 2023-03-24 LAB — TSH: TSH: 5.2 m[IU]/L — ABNORMAL HIGH (ref 0.40–4.50)

## 2023-03-24 LAB — T4, FREE: Free T4: 1.2 ng/dL (ref 0.8–1.8)

## 2023-03-24 LAB — T3 UPTAKE: T3 Uptake: 30 % (ref 22–35)

## 2023-03-30 ENCOUNTER — Other Ambulatory Visit: Payer: Self-pay

## 2023-03-30 ENCOUNTER — Other Ambulatory Visit (HOSPITAL_BASED_OUTPATIENT_CLINIC_OR_DEPARTMENT_OTHER): Payer: Self-pay

## 2023-03-30 ENCOUNTER — Emergency Department (HOSPITAL_BASED_OUTPATIENT_CLINIC_OR_DEPARTMENT_OTHER): Payer: 59

## 2023-03-30 ENCOUNTER — Encounter (HOSPITAL_BASED_OUTPATIENT_CLINIC_OR_DEPARTMENT_OTHER): Payer: Self-pay | Admitting: Emergency Medicine

## 2023-03-30 ENCOUNTER — Emergency Department (HOSPITAL_BASED_OUTPATIENT_CLINIC_OR_DEPARTMENT_OTHER)
Admission: EM | Admit: 2023-03-30 | Discharge: 2023-03-30 | Disposition: A | Discharge status: Home / Self Care | Payer: 59 | Attending: Emergency Medicine | Admitting: Emergency Medicine

## 2023-03-30 DIAGNOSIS — R1012 Left upper quadrant pain: Secondary | ICD-10-CM | POA: Insufficient documentation

## 2023-03-30 DIAGNOSIS — Z992 Dependence on renal dialysis: Secondary | ICD-10-CM | POA: Insufficient documentation

## 2023-03-30 DIAGNOSIS — I120 Hypertensive chronic kidney disease with stage 5 chronic kidney disease or end stage renal disease: Secondary | ICD-10-CM | POA: Diagnosis not present

## 2023-03-30 DIAGNOSIS — R112 Nausea with vomiting, unspecified: Secondary | ICD-10-CM | POA: Diagnosis not present

## 2023-03-30 DIAGNOSIS — N186 End stage renal disease: Secondary | ICD-10-CM | POA: Diagnosis not present

## 2023-03-30 DIAGNOSIS — D649 Anemia, unspecified: Secondary | ICD-10-CM | POA: Diagnosis not present

## 2023-03-30 DIAGNOSIS — Z9884 Bariatric surgery status: Secondary | ICD-10-CM | POA: Insufficient documentation

## 2023-03-30 LAB — COMPREHENSIVE METABOLIC PANEL
ALT: 7 U/L (ref 0–44)
AST: 10 U/L — ABNORMAL LOW (ref 15–41)
Albumin: 4.2 g/dL (ref 3.5–5.0)
Alkaline Phosphatase: 39 U/L (ref 38–126)
Anion gap: 17 — ABNORMAL HIGH (ref 5–15)
BUN: 9 mg/dL (ref 6–20)
CO2: 27 mmol/L (ref 22–32)
Calcium: 11.1 mg/dL — ABNORMAL HIGH (ref 8.9–10.3)
Chloride: 94 mmol/L — ABNORMAL LOW (ref 98–111)
Creatinine, Ser: 6.78 mg/dL — ABNORMAL HIGH (ref 0.61–1.24)
GFR, Estimated: 10 mL/min — ABNORMAL LOW (ref >60)
Glucose, Bld: 85 mg/dL (ref 70–99)
Potassium: 3.7 mmol/L (ref 3.5–5.1)
Sodium: 138 mmol/L (ref 135–145)
Total Bilirubin: 1 mg/dL (ref 0.3–1.2)
Total Protein: 7.5 g/dL (ref 6.5–8.1)

## 2023-03-30 LAB — CBC
HCT: 29.9 % — ABNORMAL LOW (ref 39.0–52.0)
Hemoglobin: 10.2 g/dL — ABNORMAL LOW (ref 13.0–17.0)
MCH: 30.4 pg (ref 26.0–34.0)
MCHC: 34.1 g/dL (ref 30.0–36.0)
MCV: 89.3 fL (ref 80.0–100.0)
Platelets: 264 10*3/uL (ref 150–400)
RBC: 3.35 MIL/uL — ABNORMAL LOW (ref 4.22–5.81)
RDW: 14.7 % (ref 11.5–15.5)
WBC: 8.4 10*3/uL (ref 4.0–10.5)
nRBC: 0 % (ref 0.0–0.2)

## 2023-03-30 LAB — LIPASE, BLOOD: Lipase: 19 U/L (ref 11–51)

## 2023-03-30 MED ORDER — IOHEXOL 300 MG/ML  SOLN
100.0000 mL | Freq: Once | INTRAMUSCULAR | Status: AC | PRN
  Administered 2023-03-30: 100 mL via INTRAVENOUS

## 2023-03-30 MED ORDER — MORPHINE SULFATE (PF) 4 MG/ML IV SOLN
4.0000 mg | Freq: Once | INTRAVENOUS | Status: AC
  Administered 2023-03-30: 4 mg via INTRAVENOUS
  Filled 2023-03-30: qty 1

## 2023-03-30 MED ORDER — ONDANSETRON HCL 4 MG/2ML IJ SOLN
4.0000 mg | Freq: Once | INTRAMUSCULAR | Status: AC
  Administered 2023-03-30: 4 mg via INTRAVENOUS
  Filled 2023-03-30: qty 2

## 2023-03-30 NOTE — ED Notes (Signed)
Patient verbalizes understanding of discharge instructions. Opportunity for questioning and answers were provided. Patient discharged from ED.  °

## 2023-03-30 NOTE — ED Notes (Signed)
Pt given cranberry juice for PO challenge

## 2023-03-30 NOTE — Discharge Instructions (Addendum)
Thank you for allowing [place] to be a part of your care today.  You were evaluated in the ED for abdominal pain.  Your workup today is overall reassuring.  Your CT scan did show postoperative changes, but no bowel obstruction, evidence of infection, or acute inflammatory changes.  I recommend following up with your surgeon.  Take your home medications as prescribed for nausea and pain.  Return to the ED if you develop sudden worsening of your symptoms or if you have any new concerns.

## 2023-03-30 NOTE — ED Triage Notes (Signed)
Pt arrives to ED with c/o emesis that started last night. Pt notes sharp abd pain to the LUQ of abdomen. Dialysis pt MWF. Hx gastric bypass 03/04/23.

## 2023-03-30 NOTE — ED Provider Notes (Signed)
[place] EMERGENCY DEPARTMENT AT [facility] Provider Note   CSN: [id number] Arrival date & time: 03/30/23  0909     History  Chief Complaint  Patient presents with   Emesis    [name] is a 37 y.o. male with past medical history significant for gastric bypass, GERD, hyperparathyroidism, ESRD on HD, hypertension, obesity presents to the ED complaining of emesis that began last night.  Patient also reports a sharp pain to the left upper quadrant of his abdomen.  He does dialysis MWF.  Patient had gastric bypass surgery on 03/04/2023.  He reports he does have intermittent vomiting since his surgery.  Denies melena, hematochezia, fever, chills, diarrhea, constipation.         Home Medications Prior to Admission medications   Medication Sig Start Date End Date Taking? Authorizing Provider  acetaminophen (TYLENOL) 160 MG/5ML solution Take by mouth. 03/13/23  Yes [provider]  diclofenac Sodium (VOLTAREN) 1 % GEL Apply topically. 03/13/23  Yes [provider]  Doxercalciferol (HECTOROL IV) Doxercalciferol (Hectorol) 03/17/23 03/15/24 Yes [provider]  gabapentin (NEURONTIN) 250 MG/5ML solution Take by mouth. 03/12/23  Yes [provider]  methocarbamol (ROBAXIN) 500 MG tablet Take by mouth. 03/13/23  Yes [provider]  NEXIUM 40 MG capsule Take by mouth. 03/12/23  Yes [provider]  omeprazole (PRILOSEC) 20 MG capsule Take 1 capsule by mouth daily. 03/21/23  Yes [provider]  ondansetron (ZOFRAN-ODT) 4 MG disintegrating tablet Take by mouth. 03/13/23  Yes [provider]  oxyCODONE (ROXICODONE) 5 MG/5ML solution Take by mouth. 03/13/23  Yes [provider]  simethicone (MYLICON) 80 MG chewable tablet Chew by mouth. 03/12/23  Yes [provider]  VELPHORO 500 MG chewable tablet Chew 500 mg by mouth 3 (three) times daily with meals. & twice daily with snacks 08/18/21  Yes [provider]  albuterol (PROVENTIL HFA;VENTOLIN HFA) 108 (90 Base) MCG/ACT inhaler Inhale 2 puffs into the lungs every 4 (four) hours as needed for wheezing or shortness of breath. 05/14/16   [name], [name]  b complex-vitamin c-folic acid (NEPHRO-VITE) 0.8 MG TABS tablet Take 1 tablet by mouth daily.  08/16/16   [provider]  calcium carbonate (TUMS EX) 750 MG chewable tablet Chew 4 tablets by mouth 3 (three) times daily.    [provider]  famotidine (PEPCID) 20 MG tablet Take 1 tablet (20 mg total) by mouth daily. 04/27/19   Bill Salinas, [name]  meloxicam (MOBIC) 15 MG tablet Take 0.5-1 tablets (7.5-15 mg total) by mouth daily as needed for pain. 08/01/19   [name], [name], MD  metoCLOPramide (REGLAN) 5 MG tablet Take 1 tablet (5 mg total) by mouth 2 (two) times daily as needed for nausea. 04/27/19   [name], [name]  mupirocin cream (BACTROBAN) 2 % Apply 1 Application topically daily.    [provider]  OZEMPIC, 0.25 OR 0.5 MG/DOSE, 2 MG/1.5ML SOPN Inject 0.5 mg into the skin once a week. 05/17/21   [provider]  pantoprazole (PROTONIX) 40 MG tablet Take 1 tablet (40 mg total) by mouth daily. Take 30 minutes before breakfast. 05/08/19   Unk [name], PA  sevelamer carbonate (RENVELA) 2.4 G PACK Take 2.4 g by mouth 3 (three) times daily with meals.    [provider]  tiZANidine (ZANAFLEX) 4 MG tablet Take 4 mg by mouth every 8 (eight) hours as needed for muscle spasms.  01/11/18   [provider]  traMADol (ULTRAM) 50 MG tablet Take 1 tablet (50 mg total) by mouth every 12 (twelve) hours as needed. 12/02/22   [name], MD      Allergies    Dilaudid [hydromorphone hcl], Amlodipine, Ascorbate, and Midodrine    Review of Systems   Review of Systems  Constitutional:  Negative for chills and fever.  Gastrointestinal:  Positive for abdominal pain, nausea and vomiting. Negative for blood in stool,  constipation and diarrhea.    Physical Exam Updated Vital Signs BP 113/78 (BP Location: Right Arm)   Pulse 86   Temp 98.1 F (36.7 C) (Oral)   Resp 16   Ht 5\' 5"  (1.651 m)   Wt 120.7 kg   SpO2 100%   BMI 44.26 kg/m  Physical Exam Vitals and nursing note reviewed.  Constitutional:      General: He is not in acute distress.    Appearance: Normal appearance. He is ill-appearing. He is not diaphoretic.  Cardiovascular:     Rate and Rhythm: Normal rate and regular rhythm.  Pulmonary:     Effort: Pulmonary effort is normal.  Abdominal:     General: Abdomen is flat. Bowel sounds are normal.     Palpations: Abdomen is soft.     Tenderness: There is abdominal tenderness in the left upper quadrant.     Comments: Laparoscopic surgical sites appear to be healing without evidence of infection.  There is tenderness to palpation in the left upper quadrant over one of the port incision sites from surgery.  No erythema or increased warmth.  Skin:    General: Skin is warm and dry.     Capillary Refill: Capillary refill takes less than 2 seconds.  Neurological:     Mental Status: He is alert. Mental status is at baseline.  Psychiatric:        Mood and Affect: Mood normal.        Behavior: Behavior normal.     ED Results / Procedures / Treatments   Labs (all labs ordered are listed, but only abnormal results are displayed) Labs Reviewed  COMPREHENSIVE METABOLIC PANEL - Abnormal; Notable for the following components:      Result Value   Chloride 94 (*)    Creatinine, Ser 6.78 (*)    Calcium 11.1 (*)    AST 10 (*)    GFR, Estimated 10 (*)    Anion gap 17 (*)    All other components within normal limits  CBC - Abnormal; Notable for the following components:   RBC 3.35 (*)    Hemoglobin 10.2 (*)    HCT 29.9 (*)    All other components within normal limits  LIPASE, BLOOD    EKG None  Radiology CT ABDOMEN PELVIS W CONTRAST  Result Date: 03/30/2023 CLINICAL DATA:  Emesis.  Recent history of gastric bypass. Left upper quadrant abdominal pain. EXAM: CT ABDOMEN AND PELVIS WITH CONTRAST TECHNIQUE: Multidetector CT imaging of the abdomen and pelvis was performed using the standard protocol following bolus administration of intravenous contrast. RADIATION DOSE REDUCTION: This exam was performed according to the departmental dose-optimization program which includes automated exposure control, adjustment of the mA and/or kV according to patient size and/or use of iterative reconstruction technique. CONTRAST:  OMNIPAQUE IOHEXOL 300 MG/ML  SOLN COMPARISON:  CT scan abdomen and pelvis from 11/12/2020. FINDINGS: Lower chest: There are patchy atelectatic changes in the visualized lung bases. No overt consolidation. No pleural effusion. The heart is normal in size.  No pericardial effusion. Hepatobiliary: The liver is normal in size. Non-cirrhotic configuration. No suspicious mass. No intrahepatic bile duct dilation. There is mild prominence of the extrahepatic bile duct, most likely due to post cholecystectomy status. Gallbladder is surgically absent. Pancreas: Unremarkable. No pancreatic ductal dilatation or surrounding inflammatory changes. Spleen: Within normal limits. No focal lesion. Adrenals/Urinary Tract: Adrenal glands are unremarkable. Bilateral small/atrophic kidneys exhibiting extensive cysts of varying sizes with largest in the left kidney lower pole measuring up to 2.9 x 3.4 cm. No discrete suspicious renal mass seen within the limitations of this exam. There are bilateral sub 5 mm cortical calcifications. No obstructing renal calculi. No hydro uretero nephrosis or ureterolithiasis. Urinary bladder is under distended, precluding optimal assessment. However, no large mass or stones identified. No perivesical fat stranding. Stomach/Bowel: Post gastric bypass changes noted. No disproportionate dilation of the small or large bowel loops. No evidence of abnormal bowel wall thickening  or inflammatory changes. The appendix is unremarkable. Vascular/Lymphatic: No ascites or pneumoperitoneum. There are few mildly enlarged mesenteric lymph nodes with largest measuring up to 1.3 x 1.7 cm, indeterminate in etiology. No aneurysmal dilation of the major abdominal arteries. Reproductive: Normal size prostate. Symmetric seminal vesicles. Other: There are 2 areas of subcutaneous fat stranding in the left upper quadrant anterior abdominal wall with small area at the midline and baker area in the left paramedian region, most likely related to port sites. There is no associated walled-off abscess or loculated collection. There are soft tissue density areas in the anterior abdominal wall subcutaneous tissue, most likely due to medication injections. Musculoskeletal: No suspicious osseous lesions. There are mild multilevel degenerative changes in the visualized spine. There are changes of bilateral chronic sacroiliitis. IMPRESSION: 1. No bowel obstruction. No acute inflammatory process identified within the abdomen or pelvis. 2. Post gastric bypass changes. There is subcutaneous fat stranding in the left upper quadrant anterior abdominal wall, likely related to port sites. No associated walled-off abscess or collection. 3. Multiple other nonacute observations, as described above. Electronically Signed   By: Jules Schick M.D.   On: 03/30/2023 13:01    Procedures Procedures    Medications Ordered in ED Medications  morphine (PF) 4 MG/ML injection 4 mg (has no administration in time range)  ondansetron (ZOFRAN) injection 4 mg (4 mg Intravenous Given 03/30/23 1007)  morphine (PF) 4 MG/ML injection 4 mg (4 mg Intravenous Given 03/30/23 1012)  iohexol (OMNIPAQUE) 300 MG/ML solution 100 mL (100 mLs Intravenous Contrast Given 03/30/23 1113)    ED Course/ Medical Decision Making/ A&P                                 Medical Decision Making Amount and/or Complexity of Data Reviewed Labs:  ordered. Radiology: ordered.  Risk Prescription drug management.   This patient presents to the ED with chief complaint(s) of LUQ abdominal pain, nausea, vomiting with pertinent past medical history of s/p gastric bypass, ESRD on HD, GERD.  The complaint involves an extensive differential diagnosis and also carries with it a high risk of complications and morbidity.    The differential diagnosis includes post operative complication, post operative pain, bowel obstruction, infection   The initial plan is to obtain labs and CT imaging  Additional history obtained: Records reviewed  patient reach out to general surgery today and was recommended to come to ED for evaluation of his nausea, vomiting, and abdominal pain.  Patient's surgeon is  in [place].  Initial Assessment:   Exam significant for ill-appearing patient who is not in acute distress.  He does appear very uncomfortable.  Abdomen is soft with tenderness to left upper quadrant.  There is also tenderness to palpation over the left side incision site from laparoscopic surgery.  All incision sites appear to be healing well without evidence of infection.  Skin is warm and dry.  Independent ECG/labs interpretation:  The following labs were independently interpreted:  CBC without leukocytosis, there is anemia which is chronic for patient.  Metabolic panel without major electrolyte disturbance.  Creatinine is baseline for patient.  Independent visualization and interpretation of imaging: I independently visualized the following imaging with scope of interpretation limited to determining acute life threatening conditions related to emergency care: CT abdomen pelvis, which revealed no evidence of bowel obstruction or acute inflammatory process within the abdomen or pelvis.  There are post gastric bypass changes and subcutaneous fat stranding in the left upper quadrant anterior abdominal wall.  Treatment and Reassessment: Patient given IV  Zofran and morphine with improvement in his symptoms.  Patient was able to tolerate drinking cranberry juice without increasing pain or nausea.  Patient has not had any episodes of emesis while in the ED.  Patient does appear more comfortable upon reassessment.  Disposition:   Patient was able to tolerate juice without episodes of vomiting or worsening of his pain.  Patient does have medication prescribed for pain and nausea at home.  Advised patient to contact his surgeon for follow-up.  Workup in ED is overall reassuring.  The patient has been appropriately medically screened and/or stabilized in the ED. I have low suspicion for any other emergent medical condition which would require further screening, evaluation or treatment in the ED or require inpatient management. At time of discharge the patient is hemodynamically stable and in no acute distress. I have discussed work-up results and diagnosis with patient and answered all questions. Patient is agreeable with discharge plan. We discussed strict return precautions for returning to the emergency department and they verbalized understanding.             Final Clinical Impression(s) / ED Diagnoses Final diagnoses:  LUQ abdominal pain  S/P gastric bypass  Nausea and vomiting, unspecified vomiting type    Rx / DC Orders ED Discharge Orders     None         Lenard Simmer, [name] 03/30/23 1408    Sloan Leiter, DO 03/31/23 2011

## 2023-04-02 ENCOUNTER — Encounter (HOSPITAL_BASED_OUTPATIENT_CLINIC_OR_DEPARTMENT_OTHER): Payer: Self-pay

## 2023-04-02 ENCOUNTER — Other Ambulatory Visit: Payer: Self-pay

## 2023-04-02 ENCOUNTER — Emergency Department (HOSPITAL_BASED_OUTPATIENT_CLINIC_OR_DEPARTMENT_OTHER)
Admission: EM | Admit: 2023-04-02 | Discharge: 2023-04-03 | Disposition: A | Discharge status: Home / Self Care | Payer: 59 | Attending: Emergency Medicine | Admitting: Emergency Medicine

## 2023-04-02 DIAGNOSIS — I120 Hypertensive chronic kidney disease with stage 5 chronic kidney disease or end stage renal disease: Secondary | ICD-10-CM | POA: Insufficient documentation

## 2023-04-02 DIAGNOSIS — R109 Unspecified abdominal pain: Secondary | ICD-10-CM | POA: Diagnosis present

## 2023-04-02 DIAGNOSIS — N186 End stage renal disease: Secondary | ICD-10-CM | POA: Insufficient documentation

## 2023-04-02 DIAGNOSIS — K529 Noninfective gastroenteritis and colitis, unspecified: Secondary | ICD-10-CM | POA: Insufficient documentation

## 2023-04-02 DIAGNOSIS — Z992 Dependence on renal dialysis: Secondary | ICD-10-CM | POA: Insufficient documentation

## 2023-04-02 LAB — COMPREHENSIVE METABOLIC PANEL
ALT: 11 U/L (ref 0–44)
AST: 10 U/L — ABNORMAL LOW (ref 15–41)
Albumin: 4.2 g/dL (ref 3.5–5.0)
Alkaline Phosphatase: 61 U/L (ref 38–126)
Anion gap: 19 — ABNORMAL HIGH (ref 5–15)
BUN: 5 mg/dL — ABNORMAL LOW (ref 6–20)
CO2: 23 mmol/L (ref 22–32)
Calcium: 10.7 mg/dL — ABNORMAL HIGH (ref 8.9–10.3)
Chloride: 95 mmol/L — ABNORMAL LOW (ref 98–111)
Creatinine, Ser: 4.04 mg/dL — ABNORMAL HIGH (ref 0.61–1.24)
GFR, Estimated: 18 mL/min — ABNORMAL LOW (ref >60)
Glucose, Bld: 91 mg/dL (ref 70–99)
Potassium: 3.1 mmol/L — ABNORMAL LOW (ref 3.5–5.1)
Sodium: 137 mmol/L (ref 135–145)
Total Bilirubin: 1 mg/dL (ref 0.3–1.2)
Total Protein: 8 g/dL (ref 6.5–8.1)

## 2023-04-02 LAB — CBC
HCT: 33.5 % — ABNORMAL LOW (ref 39.0–52.0)
Hemoglobin: 11.5 g/dL — ABNORMAL LOW (ref 13.0–17.0)
MCH: 29.9 pg (ref 26.0–34.0)
MCHC: 34.3 g/dL (ref 30.0–36.0)
MCV: 87.2 fL (ref 80.0–100.0)
Platelets: 280 10*3/uL (ref 150–400)
RBC: 3.84 MIL/uL — ABNORMAL LOW (ref 4.22–5.81)
RDW: 14.6 % (ref 11.5–15.5)
WBC: 10.4 10*3/uL (ref 4.0–10.5)
nRBC: 0 % (ref 0.0–0.2)

## 2023-04-02 LAB — LIPASE, BLOOD: Lipase: 24 U/L (ref 11–51)

## 2023-04-02 MED ORDER — ONDANSETRON HCL 4 MG/2ML IJ SOLN
4.0000 mg | Freq: Once | INTRAMUSCULAR | Status: AC
  Administered 2023-04-03: 4 mg via INTRAVENOUS
  Filled 2023-04-02: qty 2

## 2023-04-02 MED ORDER — FENTANYL CITRATE PF 50 MCG/ML IJ SOSY
50.0000 ug | PREFILLED_SYRINGE | Freq: Once | INTRAMUSCULAR | Status: AC
  Administered 2023-04-03: 50 ug via INTRAVENOUS
  Filled 2023-04-02: qty 1

## 2023-04-02 NOTE — ED Triage Notes (Addendum)
Pt to ED c/o upper abdominal pain all day. Reports intermittent in nature. Pt receives dialysis MWF, last treatment today. Hx gastric bypass 03/04/2023.

## 2023-04-03 ENCOUNTER — Emergency Department (HOSPITAL_BASED_OUTPATIENT_CLINIC_OR_DEPARTMENT_OTHER): Payer: 59

## 2023-04-03 DIAGNOSIS — K529 Noninfective gastroenteritis and colitis, unspecified: Secondary | ICD-10-CM | POA: Diagnosis not present

## 2023-04-03 MED ORDER — OXYCODONE-ACETAMINOPHEN 5-325 MG PO TABS
1.0000 | ORAL_TABLET | Freq: Once | ORAL | Status: AC
  Administered 2023-04-03: 1 via ORAL
  Filled 2023-04-03: qty 1

## 2023-04-03 MED ORDER — ONDANSETRON 4 MG PO TBDP: 4.0000 mg | ORAL_TABLET | Freq: Three times a day (TID) | ORAL | 0 refills | Status: AC | PRN

## 2023-04-03 NOTE — Discharge Instructions (Addendum)
You were seen today for abdominal pain and nausea.  You may have some enteritis or gastroenteritis which is usually viral in nature.  Take Zofran as needed for nausea.  Continue your Nexium at home.

## 2023-04-03 NOTE — ED Provider Notes (Signed)
[place] EMERGENCY DEPARTMENT AT [facility] Provider Note   CSN: [id number] Arrival date & time: 04/02/23  2139     History  Chief Complaint  Patient presents with   Abdominal Pain    [name] is a 37 y.o. male.  HPI     This is a 37 year old male who presents with abdominal pain and nausea.  He states that he had onset of upper abdominal pain while at dialysis.  Reports nausea.  Some loose stools.  No vomiting.  It is intermittent.  Not associated with food.  Reports full dialysis session today.  Recently had gastric bypass surgery on 03/04/2023.  States that he had a small bowel movement prior to arrival.  Home Medications Prior to Admission medications   Medication Sig Start Date End Date Taking? Authorizing Provider  ondansetron (ZOFRAN-ODT) 4 MG disintegrating tablet Take 1 tablet (4 mg total) by mouth every 8 (eight) hours as needed for nausea or vomiting. 04/03/23  Yes Chirstina Haan, [name], MD  acetaminophen (TYLENOL) 160 MG/5ML solution Take by mouth. 03/13/23   [provider]  albuterol (PROVENTIL HFA;VENTOLIN HFA) 108 (90 Base) MCG/ACT inhaler Inhale 2 puffs into the lungs every 4 (four) hours as needed for wheezing or shortness of breath. 05/14/16   [name], [name]  b complex-vitamin c-folic acid (NEPHRO-VITE) 0.8 MG TABS tablet Take 1 tablet by mouth daily.  08/16/16   [provider]  calcium carbonate (TUMS EX) 750 MG chewable tablet Chew 4 tablets by mouth 3 (three) times daily.    [provider]  diclofenac Sodium (VOLTAREN) 1 % GEL Apply topically. 03/13/23   [provider]  Doxercalciferol (HECTOROL IV) Doxercalciferol (Hectorol) 03/17/23 03/15/24  [provider]  famotidine (PEPCID) 20 MG tablet Take 1 tablet (20 mg total) by mouth daily. 04/27/19   [name], [name]  gabapentin (NEURONTIN) 250 MG/5ML solution Take by mouth. 03/12/23   [provider]  meloxicam (MOBIC) 15 MG tablet Take  0.5-1 tablets (7.5-15 mg total) by mouth daily as needed for pain. 08/01/19   [name], [name], MD  methocarbamol (ROBAXIN) 500 MG tablet Take by mouth. 03/13/23   [provider]  metoCLOPramide (REGLAN) 5 MG tablet Take 1 tablet (5 mg total) by mouth 2 (two) times daily as needed for nausea. 04/27/19   [name], [name]  mupirocin cream (BACTROBAN) 2 % Apply 1 Application topically daily.    [provider]  NEXIUM 40 MG capsule Take by mouth. 03/12/23   [provider]  omeprazole (PRILOSEC) 20 MG capsule Take 1 capsule by mouth daily. 03/21/23   [provider]  ondansetron (ZOFRAN-ODT) 4 MG disintegrating tablet Take by mouth. 03/13/23   [provider]  oxyCODONE (ROXICODONE) 5 MG/5ML solution Take by mouth. 03/13/23   [provider]  OZEMPIC, 0.25 OR 0.5 MG/DOSE, 2 MG/1.5ML SOPN Inject 0.5 mg into the skin once a week. 05/17/21   [provider]  pantoprazole (PROTONIX) 40 MG tablet Take 1 tablet (40 mg total) by mouth daily. Take 30 minutes before breakfast. 05/08/19   Unk [name], PA  sevelamer carbonate (RENVELA) 2.4 G PACK Take 2.4 g by mouth 3 (three) times daily with meals.    [provider]  simethicone (MYLICON) 80 MG chewable tablet Chew by mouth. 03/12/23   [provider]  tiZANidine (ZANAFLEX) 4 MG tablet Take 4 mg by mouth every 8 (eight) hours as needed for muscle spasms.  01/11/18  [provider]  traMADol (ULTRAM) 50 MG tablet Take 1 tablet (50 mg total) by mouth every 12 (twelve) hours as needed. 12/02/22   [name], MD  VELPHORO 500 MG chewable tablet Chew 500 mg by mouth 3 (three) times daily with meals. & twice daily with snacks 08/18/21   [provider]      Allergies    Dilaudid [hydromorphone hcl], Amlodipine, Ascorbate, and Midodrine    Review of Systems   Review of Systems  Constitutional:  Negative for fever.  Respiratory:  Negative for shortness  of breath.   Cardiovascular:  Negative for chest pain.  Gastrointestinal:  Positive for abdominal pain and nausea. Negative for vomiting.  All other systems reviewed and are negative.   Physical Exam Updated Vital Signs BP 117/73   Pulse 88   Temp 98.4 F (36.9 C) (Oral)   Resp (!) 21   Ht 1.651 m (5\' 5" )   Wt 120.7 kg   SpO2 98%   BMI 44.26 kg/m  Physical Exam Vitals and nursing note reviewed.  Constitutional:      Appearance: He is well-developed. He is obese. He is not ill-appearing.  HENT:     Head: Normocephalic and atraumatic.     Mouth/Throat:     Mouth: Mucous membranes are moist.  Eyes:     Pupils: Pupils are equal, round, and reactive to light.  Cardiovascular:     Rate and Rhythm: Normal rate and regular rhythm.     Heart sounds: Normal heart sounds. No murmur heard. Pulmonary:     Effort: Pulmonary effort is normal. No respiratory distress.     Breath sounds: Normal breath sounds. No wheezing.  Abdominal:     General: Bowel sounds are normal.     Palpations: Abdomen is soft.     Tenderness: There is generalized abdominal tenderness. There is no guarding or rebound.     Comments: Surgical abdominal incisions clean dry intact and well-healing  Musculoskeletal:     Cervical back: Neck supple.  Lymphadenopathy:     Cervical: No cervical adenopathy.  Skin:    General: Skin is warm and dry.  Neurological:     Mental Status: He is alert and oriented to person, place, and time.  Psychiatric:        Mood and Affect: Mood normal.     ED Results / Procedures / Treatments   Labs (all labs ordered are listed, but only abnormal results are displayed) Labs Reviewed  COMPREHENSIVE METABOLIC PANEL - Abnormal; Notable for the following components:      Result Value   Potassium 3.1 (*)    Chloride 95 (*)    BUN 5 (*)    Creatinine, Ser 4.04 (*)    Calcium 10.7 (*)    AST 10 (*)    GFR, Estimated 18 (*)    Anion gap 19 (*)    All other components within  normal limits  CBC - Abnormal; Notable for the following components:   RBC 3.84 (*)    Hemoglobin 11.5 (*)    HCT 33.5 (*)    All other components within normal limits  LIPASE, BLOOD  URINALYSIS, ROUTINE W REFLEX MICROSCOPIC    EKG None  Radiology CT ABDOMEN PELVIS WO CONTRAST  Result Date: 04/03/2023 CLINICAL DATA:  Postop abdominal pain. Gastric bypass performed 03/04/2023 EXAM: CT ABDOMEN AND PELVIS WITHOUT CONTRAST TECHNIQUE: Multidetector CT imaging of the abdomen and pelvis was performed following the standard protocol without IV contrast.  RADIATION DOSE REDUCTION: This exam was performed according to the departmental dose-optimization program which includes automated exposure control, adjustment of the mA and/or kV according to patient size and/or use of iterative reconstruction technique. COMPARISON:  CT abdomen and pelvis 03/30/2023 FINDINGS: Lower chest: No acute abnormality. Hepatobiliary: Unremarkable noncontrast appearance of the liver and biliary tree. Cholecystectomy. Pancreas: Unremarkable. Spleen: Unremarkable. Adrenals/Urinary Tract: Normal adrenal glands. Multicystic kidneys. Bilateral parenchymal calcifications or nonobstructing calyceal stones. No hydronephrosis. Nondistended bladder. Stomach/Bowel: Normal caliber large and small bowel. Postoperative change of gastric bypass. Wall thickening and mucosal hyperenhancement with adjacent fat stranding about the ileum in the right lower quadrant. Fat deposition within the ascending and transverse colon can be a normal finding or due to sequela of chronic inflammation. Normal appendix. Vascular/Lymphatic: No significant vascular findings are present. Unchanged 1.3 cm central mesenteric lymph node 2/35. Reproductive: Unremarkable. Other: Small volume free fluid in the pelvis. No abscess. No free intraperitoneal air. Improving changes in the anterior abdominal wall subcutaneous fat from laparoscopic port insertion sites. Similar right  lower quadrant injection sites. Musculoskeletal: No acute fracture. IMPRESSION: 1. Infectious/inflammatory enteritis in the right lower quadrant Electronically Signed   By: Minerva Fester M.D.   On: 04/03/2023 01:03    Procedures Procedures    Medications Ordered in ED Medications  fentaNYL (SUBLIMAZE) injection 50 mcg (50 mcg Intravenous Given 04/03/23 0025)  ondansetron (ZOFRAN) injection 4 mg (4 mg Intravenous Given 04/03/23 0025)  oxyCODONE-acetaminophen (PERCOCET/ROXICET) 5-325 MG per tablet 1 tablet (1 tablet Oral Given 04/03/23 0207)    ED Course/ Medical Decision Making/ A&P                                 Medical Decision Making Amount and/or Complexity of Data Reviewed Labs: ordered. Radiology: ordered.  Risk Prescription drug management.   This patient presents to the ED for concern of abdominal pain, this involves an extensive number of treatment options, and is a complaint that carries with it a high risk of complications and morbidity.  I considered the following differential and admission for this acute, potentially life threatening condition.  The differential diagnosis includes gastritis, gastroenteritis, pancreatitis, cholecystitis, appendicitis, SBO, complication from surgery  MDM:    This is a 37 year old male who presents with abdominal pain loose stools and nausea.  He is overall nontoxic and vital signs are reassuring.  He has some diffuse tenderness on exam without signs of peritonitis.  He is afebrile.  Patient given pain and nausea medication.  Labs obtained and reviewed.  No significant leukocytosis.  Creatinine is at baseline.  Normal LFTs and lipase.  Given recent surgery, CT scan was obtained.  No evidence of obstruction.  He does have some thickening of the intestine suggestive of enteritis.  Could be viral in nature given overall clinical picture.  He was able to tolerate fluids.  Will discharge with Zofran.  Recommend that he continue his Nexium.  (Labs,  imaging, consults)  Labs: I Ordered, and personally interpreted labs.  The pertinent results include: CBC, CMP, lipase  Imaging Studies ordered: I ordered imaging studies including CT abdomen I independently visualized and interpreted imaging. I agree with the radiologist interpretation  Additional history obtained from chart review.  External records from outside source obtained and reviewed including prior evaluations  Cardiac Monitoring: The patient was maintained on a cardiac monitor.  If on the cardiac monitor, I personally viewed and interpreted the cardiac monitored which  showed an underlying rhythm of: Sinus rhythm  Reevaluation: After the interventions noted above, I reevaluated the patient and found that they have :improved  Social Determinants of Health:  lives independently  Disposition: Discharge  Co morbidities that complicate the patient evaluation  Past Medical History:  Diagnosis Date   Arthritis    "all over" (11/19/2016)   Chronic lower back pain    Complication of anesthesia    A little while to wake up after knee surgery in 2008   Dizziness    when coming off of dialysis   ESRD (end stage renal disease) on dialysis [facility])    MWF and goes to [facility] (11/19/2016)   Family history of anesthesia complication    mom slow to wake up   GERD (gastroesophageal reflux disease)    takes Omeprazole as needed   Headache(784.0)    "monthly" (11/19/2016)   History of hiatal hernia    Hyperparathyroidism (HCC)    Hypertension    Joint pain    Joint swelling    Morbid obesity (HCC)    PONV (postoperative nausea and vomiting)    Thyroid disease      Medicines Meds ordered this encounter  Medications   fentaNYL (SUBLIMAZE) injection 50 mcg   ondansetron (ZOFRAN) injection 4 mg   oxyCODONE-acetaminophen (PERCOCET/ROXICET) 5-325 MG per tablet 1 tablet   ondansetron (ZOFRAN-ODT) 4 MG disintegrating tablet    Sig: Take 1 tablet (4 mg total) by mouth every 8  (eight) hours as needed for nausea or vomiting.    Dispense:  20 tablet    Refill:  0    I have reviewed the patients home medicines and have made adjustments as needed  Problem List / ED Course: Problem List Items Addressed This Visit   None Visit Diagnoses     Gastroenteritis    -  Primary                   Final Clinical Impression(s) / ED Diagnoses Final diagnoses:  Gastroenteritis    Rx / DC Orders ED Discharge Orders          Ordered    ondansetron (ZOFRAN-ODT) 4 MG disintegrating tablet  Every 8 hours PRN        04/03/23 0256              Shon Baton, MD 04/03/23 0301

## 2023-04-14 ENCOUNTER — Emergency Department (HOSPITAL_COMMUNITY)
Admission: EM | Admit: 2023-04-14 | Discharge: 2023-04-14 | Disposition: A | Discharge status: Home / Self Care | Payer: 59 | Attending: Emergency Medicine | Admitting: Emergency Medicine

## 2023-04-14 ENCOUNTER — Emergency Department (HOSPITAL_COMMUNITY): Payer: 59

## 2023-04-14 ENCOUNTER — Encounter (HOSPITAL_COMMUNITY): Payer: Self-pay | Admitting: Emergency Medicine

## 2023-04-14 DIAGNOSIS — R109 Unspecified abdominal pain: Secondary | ICD-10-CM | POA: Diagnosis present

## 2023-04-14 DIAGNOSIS — Z79899 Other long term (current) drug therapy: Secondary | ICD-10-CM | POA: Diagnosis not present

## 2023-04-14 DIAGNOSIS — Z992 Dependence on renal dialysis: Secondary | ICD-10-CM | POA: Insufficient documentation

## 2023-04-14 DIAGNOSIS — N186 End stage renal disease: Secondary | ICD-10-CM | POA: Insufficient documentation

## 2023-04-14 DIAGNOSIS — K4090 Unilateral inguinal hernia, without obstruction or gangrene, not specified as recurrent: Secondary | ICD-10-CM | POA: Insufficient documentation

## 2023-04-14 DIAGNOSIS — I120 Hypertensive chronic kidney disease with stage 5 chronic kidney disease or end stage renal disease: Secondary | ICD-10-CM | POA: Diagnosis not present

## 2023-04-14 LAB — CBC WITH DIFFERENTIAL/PLATELET
Abs Immature Granulocytes: 0.13 10*3/uL — ABNORMAL HIGH (ref 0.00–0.07)
Basophils Absolute: 0.1 10*3/uL (ref 0.0–0.1)
Basophils Relative: 1 %
Eosinophils Absolute: 0.4 10*3/uL (ref 0.0–0.5)
Eosinophils Relative: 4 %
HCT: 35.8 % — ABNORMAL LOW (ref 39.0–52.0)
Hemoglobin: 12 g/dL — ABNORMAL LOW (ref 13.0–17.0)
Immature Granulocytes: 1 %
Lymphocytes Relative: 22 %
Lymphs Abs: 2.1 10*3/uL (ref 0.7–4.0)
MCH: 30 pg (ref 26.0–34.0)
MCHC: 33.5 g/dL (ref 30.0–36.0)
MCV: 89.5 fL (ref 80.0–100.0)
Monocytes Absolute: 1.2 10*3/uL — ABNORMAL HIGH (ref 0.1–1.0)
Monocytes Relative: 12 %
Neutro Abs: 5.8 10*3/uL (ref 1.7–7.7)
Neutrophils Relative %: 60 %
Platelets: 310 10*3/uL (ref 150–400)
RBC: 4 MIL/uL — ABNORMAL LOW (ref 4.22–5.81)
RDW: 15.2 % (ref 11.5–15.5)
WBC: 9.7 10*3/uL (ref 4.0–10.5)
nRBC: 0 % (ref 0.0–0.2)

## 2023-04-14 LAB — COMPREHENSIVE METABOLIC PANEL
ALT: 21 U/L (ref 0–44)
AST: 24 U/L (ref 15–41)
Albumin: 3.7 g/dL (ref 3.5–5.0)
Alkaline Phosphatase: 56 U/L (ref 38–126)
Anion gap: 14 (ref 5–15)
BUN: <5 mg/dL — ABNORMAL LOW (ref 6–20)
CO2: 26 mmol/L (ref 22–32)
Calcium: 8.9 mg/dL (ref 8.9–10.3)
Chloride: 96 mmol/L — ABNORMAL LOW (ref 98–111)
Creatinine, Ser: 4.55 mg/dL — ABNORMAL HIGH (ref 0.61–1.24)
GFR, Estimated: 16 mL/min — ABNORMAL LOW (ref >60)
Glucose, Bld: 78 mg/dL (ref 70–99)
Potassium: 3.2 mmol/L — ABNORMAL LOW (ref 3.5–5.1)
Sodium: 136 mmol/L (ref 135–145)
Total Bilirubin: 1.3 mg/dL — ABNORMAL HIGH (ref 0.3–1.2)
Total Protein: 7.9 g/dL (ref 6.5–8.1)

## 2023-04-14 LAB — LIPASE, BLOOD: Lipase: 29 U/L (ref 11–51)

## 2023-04-14 MED ORDER — IOHEXOL 350 MG/ML SOLN: 75.0000 mL | Freq: Once | INTRAVENOUS | Status: DC | PRN

## 2023-04-14 MED ORDER — OXYCODONE-ACETAMINOPHEN 5-325 MG PO TABS
1.0000 | ORAL_TABLET | Freq: Once | ORAL | Status: AC
  Administered 2023-04-14: 1 via ORAL
  Filled 2023-04-14: qty 1

## 2023-04-14 MED ORDER — OXYCODONE-ACETAMINOPHEN 5-325 MG PO TABS
1.0000 | ORAL_TABLET | Freq: Four times a day (QID) | ORAL | 0 refills | Status: AC | PRN
Start: 2023-04-14 — End: 2023-04-17

## 2023-04-14 MED ORDER — FENTANYL CITRATE PF 50 MCG/ML IJ SOSY
50.0000 ug | PREFILLED_SYRINGE | Freq: Once | INTRAMUSCULAR | Status: AC
  Administered 2023-04-14: 50 ug via INTRAVENOUS
  Filled 2023-04-14: qty 1

## 2023-04-14 MED ORDER — ONDANSETRON HCL 4 MG/2ML IJ SOLN
4.0000 mg | Freq: Once | INTRAMUSCULAR | Status: AC
  Administered 2023-04-14: 4 mg via INTRAVENOUS
  Filled 2023-04-14: qty 2

## 2023-04-14 MED ORDER — IOHEXOL 350 MG/ML SOLN
75.0000 mL | Freq: Once | INTRAVENOUS | Status: AC | PRN
  Administered 2023-04-14: 75 mL via INTRAVENOUS

## 2023-04-14 NOTE — ED Provider Notes (Addendum)
[name] Provider Note   CSN: [id number] Arrival date & time: 04/14/23  1359     History  Chief Complaint  Patient presents with   Abdominal Pain    [name] is a 37 y.o. male.  Patient is a 37 year old male with a history of morbid obesity, hypertension, end-stage renal disease on dialysis Monday Wednesday Friday, recent gastric bypass in August with recurrent abdominal pain since the procedure who is presenting today due to worsening abdominal pain.  Patient reports that he has been keeping some soups and broths down.  Sometimes eating makes the pain worse but is not consistently the cause.  He also notices that sometimes at dialysis the pain will get worse.  Last normal bowel movement was yesterday formed stool without any diarrhea.  He has not had any nausea vomiting.  He is planning on seeing his surgeon next week at [name] who did the procedure.  He has been taking oxycodone as needed for the pain but ran out of that about 3 days ago.  He has not had fever or any opening or drainage of his surgical sites.  He does report that the pain feels to be in a similar location as what he has had before.  He is also noticed firm spot around one of his incision sites and reports when he has the pain that always seems to be more prominent.  The history is provided by the patient.  Abdominal Pain      Home Medications Prior to Admission medications   Medication Sig Start Date End Date Taking? Authorizing Provider  acetaminophen (TYLENOL) 160 MG/5ML solution Take by mouth. 03/13/23   [provider]  albuterol (PROVENTIL HFA;VENTOLIN HFA) 108 (90 Base) MCG/ACT inhaler Inhale 2 puffs into the lungs every 4 (four) hours as needed for wheezing or shortness of breath. 05/14/16   [name], [name]  b complex-vitamin c-folic acid (NEPHRO-VITE) 0.8 MG TABS tablet Take 1 tablet by mouth daily.  08/16/16   [provider]  calcium  carbonate (TUMS EX) 750 MG chewable tablet Chew 4 tablets by mouth 3 (three) times daily.    [provider]  diclofenac Sodium (VOLTAREN) 1 % GEL Apply topically. 03/13/23   [provider]  Doxercalciferol (HECTOROL IV) Doxercalciferol (Hectorol) 03/17/23 03/15/24  [provider]  famotidine (PEPCID) 20 MG tablet Take 1 tablet (20 mg total) by mouth daily. 04/27/19   [name], [name]  gabapentin (NEURONTIN) 250 MG/5ML solution Take by mouth. 03/12/23   [provider]  meloxicam (MOBIC) 15 MG tablet Take 0.5-1 tablets (7.5-15 mg total) by mouth daily as needed for pain. 08/01/19   [name], [name], MD  methocarbamol (ROBAXIN) 500 MG tablet Take by mouth. 03/13/23   [provider]  metoCLOPramide (REGLAN) 5 MG tablet Take 1 tablet (5 mg total) by mouth 2 (two) times daily as needed for nausea. 04/27/19   [name], [name]  mupirocin cream (BACTROBAN) 2 % Apply 1 Application topically daily.    [provider]  NEXIUM 40 MG capsule Take by mouth. 03/12/23   [provider]  omeprazole (PRILOSEC) 20 MG capsule Take 1 capsule by mouth daily. 03/21/23   [provider]  ondansetron (ZOFRAN-ODT) 4 MG disintegrating tablet Take by mouth. 03/13/23   [provider]  ondansetron (ZOFRAN-ODT) 4 MG disintegrating tablet Take 1 tablet (4 mg total) by mouth every 8 (eight) hours as needed for nausea or  vomiting. 04/03/23   [name], [name], MD  oxyCODONE (ROXICODONE) 5 MG/5ML solution Take by mouth. 03/13/23   [provider]  OZEMPIC, 0.25 OR 0.5 MG/DOSE, 2 MG/1.5ML SOPN Inject 0.5 mg into the skin once a week. 05/17/21   [provider]  pantoprazole (PROTONIX) 40 MG tablet Take 1 tablet (40 mg total) by mouth daily. Take 30 minutes before breakfast. 05/08/19   Unk [name], PA  sevelamer carbonate (RENVELA) 2.4 G PACK Take 2.4 g by mouth 3 (three) times daily with meals.    [provider]   simethicone (MYLICON) 80 MG chewable tablet Chew by mouth. 03/12/23   [provider]  tiZANidine (ZANAFLEX) 4 MG tablet Take 4 mg by mouth every 8 (eight) hours as needed for muscle spasms.  01/11/18   [provider]  traMADol (ULTRAM) 50 MG tablet Take 1 tablet (50 mg total) by mouth every 12 (twelve) hours as needed. 12/02/22   [name], MD  VELPHORO 500 MG chewable tablet Chew 500 mg by mouth 3 (three) times daily with meals. & twice daily with snacks 08/18/21   [provider]      Allergies    Dilaudid [hydromorphone hcl], Amlodipine, Ascorbate, and Midodrine    Review of Systems   Review of Systems  Gastrointestinal:  Positive for abdominal pain.    Physical Exam Updated Vital Signs BP 107/77 (BP Location: Right Arm)   Pulse 86   Temp (!) 97.4 F (36.3 C) (Oral)   Resp 20   Ht 5\' 5"  (1.651 m)   Wt 120 kg   SpO2 100%   BMI 44.02 kg/m  Physical Exam Vitals and nursing note reviewed.  Constitutional:      Appearance: He is well-developed.     Comments: Appears in pain  HENT:     Head: Normocephalic and atraumatic.  Eyes:     Conjunctiva/sclera: Conjunctivae normal.     Pupils: Pupils are equal, round, and reactive to light.  Cardiovascular:     Rate and Rhythm: Normal rate and regular rhythm.     Heart sounds: No murmur heard. Pulmonary:     Effort: Pulmonary effort is normal. No respiratory distress.     Breath sounds: Normal breath sounds. No wheezing or rales.  Abdominal:     General: A surgical scar is present. There is no distension.     Palpations: Abdomen is soft.     Tenderness: There is no abdominal tenderness. There is no guarding or rebound.     Comments: Well-healed surgical scars over the abdomen.  The abdomen seems to be tender in the mid left quadrant specifically around one of the surgical sites with a firm mass noted around the incision.  No other incisions have similar findings.  Bowel sounds are normal.   Musculoskeletal:        General: No tenderness. Normal range of motion.     Cervical back: Normal range of motion and neck supple.     Comments: Graft present in the left upper extremity with bandage in place after recent dialysis  Skin:    General: Skin is warm and dry.     Findings: No erythema or rash.  Neurological:     Mental Status: He is alert and oriented to person, place, and time. Mental status is at baseline.  Psychiatric:        Behavior: Behavior normal.     ED Results / Procedures / Treatments   Labs (all labs  ordered are listed, but only abnormal results are displayed) Labs Reviewed  CBC WITH DIFFERENTIAL/PLATELET - Abnormal; Notable for the following components:      Result Value   RBC 4.00 (*)    Hemoglobin 12.0 (*)    HCT 35.8 (*)    Monocytes Absolute 1.2 (*)    Abs Immature Granulocytes 0.13 (*)    All other components within normal limits  COMPREHENSIVE METABOLIC PANEL - Abnormal; Notable for the following components:   Potassium 3.2 (*)    Chloride 96 (*)    BUN <5 (*)    Creatinine, Ser 4.55 (*)    Total Bilirubin 1.3 (*)    GFR, Estimated 16 (*)    All other components within normal limits  LIPASE, BLOOD    EKG None  Radiology No results found.  Procedures Procedures    Medications Ordered in ED Medications  ondansetron (ZOFRAN) injection 4 mg (4 mg Intravenous Given 04/14/23 1420)  fentaNYL (SUBLIMAZE) injection 50 mcg (50 mcg Intravenous Given 04/14/23 1420)    ED Course/ Medical Decision Making/ A&P                                 Medical Decision Making Amount and/or Complexity of Data Reviewed Labs: ordered. Radiology: ordered.  Risk Prescription drug management.   Pt with multiple medical problems and comorbidities and presenting today with a complaint that caries a high risk for morbidity and mortality.  Here today with recurrent abdominal pain in the setting of recent gastric bypass.  This is now patient's third visit  for similar symptoms.  He has been compliant with his dialysis, vital signs are reassuring.  Patient does have a firm mass noted under one of his surgical sites and concern for possible incarcerated hernia versus healing hematoma or unrelated cause.  Concern that this may just be pain from recent gastric bypass and patient recently ran out of oxycodone 3 days ago.  Low suspicion for infectious etiology.  Patient last bowel movement was yesterday has no nausea vomiting and lower suspicion for obstruction.  Patient given pain control.  Labs and imaging pending.  3:54 PM I independently interpreted patient's labs and CBC today with normal white count, stable hemoglobin, CMP with mild hypokalemia of 3.2 and creatinine consistent with end-stage renal disease.  Lipase within normal limits.  CT is pending.  Patient checked out to Dr. Maple Hudson       Final Clinical Impression(s) / ED Diagnoses Final diagnoses:  None    Rx / DC Orders ED Discharge Orders     None         Gwyneth Sprout, MD 04/14/23 1554    Gwyneth Sprout, MD 04/14/23 1555

## 2023-04-14 NOTE — ED Triage Notes (Signed)
Pt arrives via EMS from dialysis where pt received 3 hours or normal 4.5 hour treatment. Pt here with upper abd pain that shoots to back. Gastric bypass last month and reports this has been happening since. Pt had CT abd on 8/27 and 8/31. Pt has follow-up appt 9/23.

## 2023-04-14 NOTE — Discharge Instructions (Signed)
Follow-up with your primary doctor and your surgeon.  Return showed fevers, chills, worsening pain, inability eat or drink due to nausea and vomiting or you stop having bowel movements.  May also return for any new or worsening symptoms that are concerning to you.

## 2023-04-14 NOTE — ED Provider Notes (Addendum)
Re-evaluated; pain improved.   Physical Exam  BP 107/77 (BP Location: Right Arm)   Pulse 86   Temp (!) 97.4 F (36.3 C) (Oral)   Resp 20   Ht 5\' 5"  (1.651 m)   Wt 120 kg   SpO2 100%   BMI 44.02 kg/m   Physical Exam Vitals and nursing note reviewed.  Constitutional:      Comments: Sleeping; awoke easily to voice.   HENT:     Head: Normocephalic.  Cardiovascular:     Rate and Rhythm: Normal rate and regular rhythm.  Pulmonary:     Effort: Pulmonary effort is normal.     Breath sounds: Normal breath sounds.  Abdominal:     Tenderness: There is generalized abdominal tenderness. There is no guarding or rebound.     Hernia: A hernia is present.  Neurological:     General: No focal deficit present.  Psychiatric:        Mood and Affect: Mood normal.        Behavior: Behavior normal.     Procedures  Procedures  ED Course / MDM   Clinical Course as of 04/14/23 1840  Wed Apr 14, 2023  1829 CT ABDOMEN PELVIS W CONTRAST IMPRESSION: 1. Previously seen findings of infectious/inflammatory enteritis on the study from 04/03/2023 have resolved. No new acute finding in the abdomen or pelvis. 2. Postsurgical changes reflecting gastric bypass without evidence of complication.   [TY]    Clinical Course User Index [TY] Coral Spikes, DO   Medical Decision Making Received handoff; disposition pending CT scan.  See morning team's note for full HPI.  Labs reviewed overall reassuring.  No fever tachycardia leukocytosis to suggest systemic infection.  No transaminitis to suggest hepatobiliary disease. Has benign abdomen on exam.  Will discharge in stable condition to follow-up with his surgeons at [facility] next week.  Amount and/or Complexity of Data Reviewed Labs: ordered. Radiology: ordered. Decision-making details documented in ED Course.  Risk Prescription drug management.         Coral Spikes, DO 04/14/23 1839    Coral Spikes, DO 04/14/23 1840

## 2023-05-08 ENCOUNTER — Emergency Department (HOSPITAL_BASED_OUTPATIENT_CLINIC_OR_DEPARTMENT_OTHER)
Admission: EM | Admit: 2023-05-08 | Discharge: 2023-05-08 | Disposition: A | Discharge status: Home / Self Care | Payer: 59 | Attending: Emergency Medicine | Admitting: Emergency Medicine

## 2023-05-08 ENCOUNTER — Encounter (HOSPITAL_BASED_OUTPATIENT_CLINIC_OR_DEPARTMENT_OTHER): Payer: Self-pay

## 2023-05-08 ENCOUNTER — Emergency Department (HOSPITAL_BASED_OUTPATIENT_CLINIC_OR_DEPARTMENT_OTHER): Payer: 59 | Admitting: Radiology

## 2023-05-08 ENCOUNTER — Other Ambulatory Visit: Payer: Self-pay

## 2023-05-08 DIAGNOSIS — R112 Nausea with vomiting, unspecified: Secondary | ICD-10-CM | POA: Diagnosis not present

## 2023-05-08 DIAGNOSIS — R1013 Epigastric pain: Secondary | ICD-10-CM | POA: Insufficient documentation

## 2023-05-08 LAB — CBC WITH DIFFERENTIAL/PLATELET
Abs Immature Granulocytes: 0.17 10*3/uL — ABNORMAL HIGH (ref 0.00–0.07)
Basophils Absolute: 0.1 10*3/uL (ref 0.0–0.1)
Basophils Relative: 1 %
Eosinophils Absolute: 0.1 10*3/uL (ref 0.0–0.5)
Eosinophils Relative: 2 %
HCT: 30 % — ABNORMAL LOW (ref 39.0–52.0)
Hemoglobin: 10 g/dL — ABNORMAL LOW (ref 13.0–17.0)
Immature Granulocytes: 2 %
Lymphocytes Relative: 20 %
Lymphs Abs: 1.8 10*3/uL (ref 0.7–4.0)
MCH: 29.9 pg (ref 26.0–34.0)
MCHC: 33.3 g/dL (ref 30.0–36.0)
MCV: 89.6 fL (ref 80.0–100.0)
Monocytes Absolute: 1.4 10*3/uL — ABNORMAL HIGH (ref 0.1–1.0)
Monocytes Relative: 15 %
Neutro Abs: 5.5 10*3/uL (ref 1.7–7.7)
Neutrophils Relative %: 60 %
Platelets: 232 10*3/uL (ref 150–400)
RBC: 3.35 MIL/uL — ABNORMAL LOW (ref 4.22–5.81)
RDW: 16.5 % — ABNORMAL HIGH (ref 11.5–15.5)
WBC: 9.1 10*3/uL (ref 4.0–10.5)
nRBC: 0 % (ref 0.0–0.2)

## 2023-05-08 LAB — COMPREHENSIVE METABOLIC PANEL
ALT: 14 U/L (ref 0–44)
AST: 15 U/L (ref 15–41)
Albumin: 3.7 g/dL (ref 3.5–5.0)
Alkaline Phosphatase: 56 U/L (ref 38–126)
Anion gap: 17 — ABNORMAL HIGH (ref 5–15)
BUN: 9 mg/dL (ref 6–20)
CO2: 25 mmol/L (ref 22–32)
Calcium: 9.2 mg/dL (ref 8.9–10.3)
Chloride: 94 mmol/L — ABNORMAL LOW (ref 98–111)
Creatinine, Ser: 5.78 mg/dL — ABNORMAL HIGH (ref 0.61–1.24)
GFR, Estimated: 12 mL/min — ABNORMAL LOW (ref >60)
Glucose, Bld: 77 mg/dL (ref 70–99)
Potassium: 3.8 mmol/L (ref 3.5–5.1)
Sodium: 136 mmol/L (ref 135–145)
Total Bilirubin: 1 mg/dL (ref 0.3–1.2)
Total Protein: 6.9 g/dL (ref 6.5–8.1)

## 2023-05-08 LAB — LIPASE, BLOOD: Lipase: 36 U/L (ref 11–51)

## 2023-05-08 LAB — TROPONIN I (HIGH SENSITIVITY): Troponin I (High Sensitivity): 11 ng/L (ref <18)

## 2023-05-08 MED ORDER — FENTANYL CITRATE PF 50 MCG/ML IJ SOSY
50.0000 ug | PREFILLED_SYRINGE | Freq: Once | INTRAMUSCULAR | Status: AC
  Administered 2023-05-08: 50 ug via INTRAVENOUS
  Filled 2023-05-08: qty 1

## 2023-05-08 MED ORDER — TRAMADOL HCL 50 MG PO TABS
50.0000 mg | ORAL_TABLET | Freq: Once | ORAL | Status: AC
  Administered 2023-05-08: 50 mg via ORAL
  Filled 2023-05-08: qty 1

## 2023-05-08 MED ORDER — METOCLOPRAMIDE HCL 10 MG PO TABS
5.0000 mg | ORAL_TABLET | Freq: Two times a day (BID) | ORAL | 0 refills | Status: DC | PRN
Start: 2023-05-08 — End: 2024-05-04

## 2023-05-08 MED ORDER — FAMOTIDINE IN NACL 20-0.9 MG/50ML-% IV SOLN
20.0000 mg | Freq: Once | INTRAVENOUS | Status: AC
  Administered 2023-05-08: 20 mg via INTRAVENOUS
  Filled 2023-05-08: qty 50

## 2023-05-08 MED ORDER — TRAMADOL HCL 50 MG PO TABS
50.0000 mg | ORAL_TABLET | Freq: Three times a day (TID) | ORAL | 0 refills | Status: DC | PRN
Start: 2023-05-08 — End: 2023-07-07

## 2023-05-08 MED ORDER — ONDANSETRON HCL 4 MG/2ML IJ SOLN
4.0000 mg | Freq: Once | INTRAMUSCULAR | Status: AC
  Administered 2023-05-08: 4 mg via INTRAVENOUS
  Filled 2023-05-08: qty 2

## 2023-05-08 MED ORDER — MORPHINE SULFATE (PF) 2 MG/ML IV SOLN
2.0000 mg | Freq: Once | INTRAVENOUS | Status: AC
  Administered 2023-05-08: 2 mg via INTRAVENOUS
  Filled 2023-05-08: qty 1

## 2023-05-08 MED ORDER — METOCLOPRAMIDE HCL 5 MG/ML IJ SOLN
5.0000 mg | Freq: Once | INTRAMUSCULAR | Status: AC
  Administered 2023-05-08: 5 mg via INTRAVENOUS
  Filled 2023-05-08: qty 2

## 2023-05-08 NOTE — ED Notes (Signed)
Patient provided apple juice for PO challenge.

## 2023-05-08 NOTE — ED Notes (Signed)
Patient resting quietly in stretcher, respirations even, unlabored, no acute distress noted. Denies needs at this time. Awaiting disposition, patient aware.

## 2023-05-08 NOTE — ED Notes (Signed)
Reviewed AVS with patient, patient expressed understanding of directions, denies further questions at this time. 

## 2023-05-08 NOTE — ED Triage Notes (Signed)
Pt c/o mid chest and lower abd pain with severe N/V this AM. Pt states he had gastric bypass on 8/1 and has had complications since the surgery. Pt is a dialysis pt and had a partial treatment yesterday.

## 2023-05-08 NOTE — ED Notes (Signed)
Provider to bedside to speak with patient.

## 2023-05-08 NOTE — ED Notes (Addendum)
Patient c/o abdominal pain and chest pain that started last night while laying in bed. Patient reports 4 episodes of clear emesis. Patient reports he attempts to eat, but cannot keep food down since last night. Patient appears to be in pain, grunting and wincing. Patient denies shortness of breath. Patient's airway intact, nonlabored and even respirations.

## 2023-05-08 NOTE — ED Provider Notes (Signed)
[place] EMERGENCY DEPARTMENT AT [facility] Provider Note   CSN: [id number] Arrival date & time: 05/08/23  1432     History  CC: Epigastric pain, nausea and vomiting   [name] is a 38 y.o. male w/ hx of gastric bypass surgery presenting to ED with abdominal pain.  Patient reports on and off abdominal and epigastric pain with vomiting since he had his gastric surgery approximately 2 to 3 months ago at [facility].  Since then he has been seen in the ED on 3 prior occasions for abdominal pain.  He has had 3 CT scans including August 31, was found to have an enteritis felt to be likely viral, then repeat CT scan in September which showed resolution of the enteritis type findings.  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  He is on Carafate suspension.  For reports that he has severe pain that began earlier this week.  Reports nausea and vomiting difficulty keeping down food or fluids.  He has attended dialysis, he goes Monday Wednesday Friday reports he has not missed any this week.  He says he has tried to contact his gastric surgeon was told "nothing is wrong and everything looks normal".  Of note the patient also had a CT scan performed 2 Mcgrady ago at [facility], on September 23, which noted that he was post Roux-en-Y gastric bypass without evidence of bowel inflammation or obstruction, but noting some prominent lymph nodes seen within the root of small bowel mesentery measuring up to 1.3 cm, thought to be reactive to enteritis.  He also has multicystic kidney disease.  HPI     Home Medications Prior to Admission medications   Medication Sig Start Date End Date Taking? Authorizing Provider  metoCLOPramide (REGLAN) 10 MG tablet Take 0.5 tablets (5 mg total) by mouth every 12 (twelve) hours as needed for up to 10 doses for refractory nausea / vomiting.  05/08/23  Yes Katessa Attridge, Kermit Balo, MD  traMADol (ULTRAM) 50 MG tablet Take 1 tablet (50 mg total) by mouth every 8 (eight) hours as needed for up to 12 doses for severe pain. 05/08/23  Yes Terald Sleeper, MD  acetaminophen (TYLENOL) 160 MG/5ML solution Take by mouth. 03/13/23   [provider]  albuterol (PROVENTIL HFA;VENTOLIN HFA) 108 (90 Base) MCG/ACT inhaler Inhale 2 puffs into the lungs every 4 (four) hours as needed for wheezing or shortness of breath. 05/14/16   [name], [name]  b complex-vitamin c-folic acid (NEPHRO-VITE) 0.8 MG TABS tablet Take 1 tablet by mouth daily.  08/16/16   [provider]  calcium carbonate (TUMS EX) 750 MG chewable tablet Chew 4 tablets by mouth 3 (three) times daily.    [provider]  diclofenac Sodium (VOLTAREN) 1 % GEL Apply topically. 03/13/23   [provider]  Doxercalciferol (HECTOROL IV) Doxercalciferol (Hectorol) 03/17/23 03/15/24  [provider]  famotidine (PEPCID) 20 MG tablet Take 1 tablet (20 mg total) by mouth daily. 04/27/19   [name], [name]  gabapentin (NEURONTIN) 250 MG/5ML solution Take by mouth. 03/12/23   [provider]  meloxicam (MOBIC) 15 MG tablet Take 0.5-1 tablets (7.5-15 mg total) by mouth daily as needed for pain. 08/01/19   [name], [name], MD  methocarbamol (ROBAXIN) 500 MG tablet Take by mouth. 03/13/23   [provider]  metoCLOPramide (REGLAN) 5 MG tablet  Take 1 tablet (5 mg total) by mouth 2 (two) times daily as needed for nausea. 04/27/19   [name], [name]  mupirocin cream (BACTROBAN) 2 % Apply 1 Application topically daily.    [provider]  NEXIUM 40 MG capsule Take by mouth. 03/12/23   [provider]  omeprazole (PRILOSEC) 20 MG capsule Take 1 capsule by mouth daily. 03/21/23   [provider]  ondansetron (ZOFRAN-ODT) 4 MG disintegrating tablet Take by mouth. 03/13/23   [provider]  ondansetron (ZOFRAN-ODT)  4 MG disintegrating tablet Take 1 tablet (4 mg total) by mouth every 8 (eight) hours as needed for nausea or vomiting. 04/03/23   [name], [name], MD  oxyCODONE (ROXICODONE) 5 MG/5ML solution Take by mouth. 03/13/23   [provider]  OZEMPIC, 0.25 OR 0.5 MG/DOSE, 2 MG/1.5ML SOPN Inject 0.5 mg into the skin once a week. 05/17/21   [provider]  pantoprazole (PROTONIX) 40 MG tablet Take 1 tablet (40 mg total) by mouth daily. Take 30 minutes before breakfast. 05/08/19   Unk [name], PA  sevelamer carbonate (RENVELA) 2.4 G PACK Take 2.4 g by mouth 3 (three) times daily with meals.    [provider]  simethicone (MYLICON) 80 MG chewable tablet Chew by mouth. 03/12/23   [provider]  tiZANidine (ZANAFLEX) 4 MG tablet Take 4 mg by mouth every 8 (eight) hours as needed for muscle spasms.  01/11/18   [provider]  traMADol (ULTRAM) 50 MG tablet Take 1 tablet (50 mg total) by mouth every 12 (twelve) hours as needed. 12/02/22   [name], MD  VELPHORO 500 MG chewable tablet Chew 500 mg by mouth 3 (three) times daily with meals. & twice daily with snacks 08/18/21   [provider]      Allergies    Dilaudid [hydromorphone hcl], Amlodipine, Ascorbate, and Midodrine    Review of Systems   Review of Systems  Physical Exam Updated Vital Signs BP 110/68   Pulse 92   Temp 97.9 F (36.6 C)   Resp (!) 21   Ht 5\' 5"  (1.651 m)   Wt 110.7 kg   SpO2 94%   BMI 40.60 kg/m  Physical Exam Constitutional:      General: He is not in acute distress. HENT:     Head: Normocephalic and atraumatic.  Eyes:     Conjunctiva/sclera: Conjunctivae normal.     Pupils: Pupils are equal, round, and reactive to light.  Cardiovascular:     Rate and Rhythm: Normal rate and regular rhythm.  Pulmonary:     Effort: Pulmonary effort is normal. No respiratory distress.  Abdominal:     General: There is no distension.     Tenderness: There is  abdominal tenderness.  Skin:    General: Skin is warm and dry.  Neurological:     General: No focal deficit present.     Mental Status: He is alert. Mental status is at baseline.  Psychiatric:        Mood and Affect: Mood normal.        Behavior: Behavior normal.     ED Results / Procedures / Treatments   Labs (all labs ordered are listed, but only abnormal results are displayed) Labs Reviewed  COMPREHENSIVE METABOLIC PANEL - Abnormal; Notable for the following components:      Result Value   Chloride 94 (*)    Creatinine, Ser 5.78 (*)    GFR, Estimated 12 (*)  Anion gap 17 (*)    All other components within normal limits  CBC WITH DIFFERENTIAL/PLATELET - Abnormal; Notable for the following components:   RBC 3.35 (*)    Hemoglobin 10.0 (*)    HCT 30.0 (*)    RDW 16.5 (*)    Monocytes Absolute 1.4 (*)    Abs Immature Granulocytes 0.17 (*)    All other components within normal limits  LIPASE, BLOOD  TROPONIN I (HIGH SENSITIVITY)    EKG EKG Interpretation Date/Time:  Saturday May 08 2023 15:09:18 EDT Ventricular Rate:  91 PR Interval:  150 QRS Duration:  88 QT Interval:  358 QTC Calculation: 440 R Axis:   14  Text Interpretation: Normal sinus rhythm Normal ECG When compared with ECG of 12-Nov-2020 20:19, No significant change was found Baseline wander leads V2-V3 Confirmed by Alvester Chou [phone]) on 05/08/2023 3:11:50 PM  Radiology DG Chest 2 View  Result Date: 05/08/2023 CLINICAL DATA:  Chest pain. History of end-stage renal disease, on dialysis. EXAM: CHEST - 2 VIEW COMPARISON:  11/12/2020 FINDINGS: Unchanged cardiac silhouette and mediastinal contours. No focal parenchymal opacities. No pleural effusion or pneumothorax. No evidence of edema. 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 Postcholecystectomy. IMPRESSION: No acute cardiopulmonary disease. Electronically Signed   By: Simonne Come M.D.   On:  05/08/2023 17:09    Procedures Procedures    Medications Ordered in ED Medications  morphine (PF) 2 MG/ML injection 2 mg (has no administration in time range)  fentaNYL (SUBLIMAZE) injection 50 mcg (50 mcg Intravenous Given 05/08/23 1628)  ondansetron (ZOFRAN) injection 4 mg (4 mg Intravenous Given 05/08/23 1622)  metoCLOPramide (REGLAN) injection 5 mg (5 mg Intravenous Given 05/08/23 1625)  famotidine (PEPCID) IVPB 20 mg premix (0 mg Intravenous Stopped 05/08/23 1702)  traMADol (ULTRAM) tablet 50 mg (50 mg Oral Given 05/08/23 1738)    ED Course/ Medical Decision Making/ A&P Clinical Course as of 05/08/23 1934  Sat May 08, 2023  1932 Patient reassessed and improved some improvement of his pain but it is still moderate intensity.  We discussed the very small dose of IV morphine, 2 mg.  He is able to tolerate some p.o.  At this time I do not see an indication for hospitalization.  I would recommend that he follow-up with his prior gastric surgeon or GI doctor, and he said he would do so. [MT]    Clinical Course User Index [MT] Rochanda Harpham, Kermit Balo, MD                                 Medical Decision Making Amount and/or Complexity of Data Reviewed Labs: ordered. Radiology: ordered.  Risk Prescription drug management.   This patient presents to the ED with concern for epigastric, abdominal pain, nausea vomiting. This involves an extensive number of treatment options, and is a complaint that carries with it a high risk of complications and morbidity.  The differential diagnosis includes postoperative infection or inflammation versus gastric ulcer versus gastroparesis versus biliary disease versus pancreatitis versus other  Co-morbidities that complicate the patient evaluation: History of chronic abdominal pain and cyclical vomiting  External records from outside source obtained and reviewed including [facility] records including upper endoscopy and CT imaging from last month, September, prior ED  visit and CT scans  I ordered and personally interpreted labs.  The pertinent results include: No emergent findings   The patient was  maintained on a cardiac monitor.  I personally viewed and interpreted the cardiac monitored which showed an underlying rhythm of: Sinus rhythm and sinus tachycardia  Per my interpretation the patient's ECG shows no ischemic findings  I ordered medication including IV pain and nausea medication  I have reviewed the patients home medicines and have made adjustments as needed  Test Considered: I have a low suspicion for acute intra-abdominal surgical or infectious process to warrant emergent repeat CT scan.  The patient has now had 4 CT scans in the span of 2 months for this ongoing cyclical abdominal pain, with no clear etiology identified.  He has had a potential enteritis in the past, felt to be viral, and then resolved on prior imaging.  He has no leukocytosis at this time to raise concern for bacterial infection.  After the interventions noted above, I reevaluated the patient and found that they have: improved  Dispostion:  After consideration of the diagnostic results and the patients response to treatment, I feel that the patent would benefit from follow-up with his gastric surgeon.         Final Clinical Impression(s) / ED Diagnoses Final diagnoses:  Epigastric pain    Rx / DC Orders ED Discharge Orders          Ordered    traMADol (ULTRAM) 50 MG tablet  Every 8 hours PRN        05/08/23 1934    metoCLOPramide (REGLAN) 10 MG tablet  Every 12 hours PRN        05/08/23 1934              Terald Sleeper, MD 05/08/23 1934

## 2023-05-08 NOTE — ED Notes (Signed)
Patient reported he tried to drink the apple juice but the pain came back. MD notified. Patient reports he will continue to try to drink the apple juice.

## 2023-05-08 NOTE — Discharge Instructions (Signed)
Please call your gastroenterologist or your gastric surgeon to discuss these recurring episodes of nausea and vomiting.  Stick to a light diet, including liquid diet, or shakes, avoiding spicy food, very hot beverages, and greasy food.  You can use Zofran as needed for initial nausea, and Reglan for severe nausea and vomiting.  I prescribed a short course of opioid pain medicines called tramadol.  Take these only for severe and intractable pain.  Long-term use of opioid medicines has been linked to addiction, dependency, and can worsen in the long-term constipation, ileus, and GI issues.

## 2023-05-30 ENCOUNTER — Other Ambulatory Visit: Payer: Self-pay

## 2023-05-30 ENCOUNTER — Emergency Department (HOSPITAL_COMMUNITY)
Admission: EM | Admit: 2023-05-30 | Discharge: 2023-05-30 | Disposition: A | Discharge status: Home / Self Care | Payer: 59 | Attending: Emergency Medicine | Admitting: Emergency Medicine

## 2023-05-30 ENCOUNTER — Emergency Department (HOSPITAL_COMMUNITY): Payer: 59

## 2023-05-30 ENCOUNTER — Encounter (HOSPITAL_COMMUNITY): Payer: Self-pay | Admitting: Pharmacy Technician

## 2023-05-30 DIAGNOSIS — Z992 Dependence on renal dialysis: Secondary | ICD-10-CM | POA: Insufficient documentation

## 2023-05-30 DIAGNOSIS — I120 Hypertensive chronic kidney disease with stage 5 chronic kidney disease or end stage renal disease: Secondary | ICD-10-CM | POA: Diagnosis not present

## 2023-05-30 DIAGNOSIS — Z79899 Other long term (current) drug therapy: Secondary | ICD-10-CM | POA: Diagnosis not present

## 2023-05-30 DIAGNOSIS — R072 Precordial pain: Secondary | ICD-10-CM

## 2023-05-30 DIAGNOSIS — N186 End stage renal disease: Secondary | ICD-10-CM | POA: Diagnosis not present

## 2023-05-30 DIAGNOSIS — R1110 Vomiting, unspecified: Secondary | ICD-10-CM | POA: Insufficient documentation

## 2023-05-30 LAB — BASIC METABOLIC PANEL
Anion gap: 12 (ref 5–15)
BUN: 8 mg/dL (ref 6–20)
CO2: 26 mmol/L (ref 22–32)
Calcium: 10.1 mg/dL (ref 8.9–10.3)
Chloride: 98 mmol/L (ref 98–111)
Creatinine, Ser: 7.24 mg/dL — ABNORMAL HIGH (ref 0.61–1.24)
GFR, Estimated: 9 mL/min — ABNORMAL LOW (ref >60)
Glucose, Bld: 91 mg/dL (ref 70–99)
Potassium: 4.5 mmol/L (ref 3.5–5.1)
Sodium: 136 mmol/L (ref 135–145)

## 2023-05-30 LAB — CBC
HCT: 33.1 % — ABNORMAL LOW (ref 39.0–52.0)
Hemoglobin: 10.4 g/dL — ABNORMAL LOW (ref 13.0–17.0)
MCH: 30.2 pg (ref 26.0–34.0)
MCHC: 31.4 g/dL (ref 30.0–36.0)
MCV: 96.2 fL (ref 80.0–100.0)
Platelets: 257 10*3/uL (ref 150–400)
RBC: 3.44 MIL/uL — ABNORMAL LOW (ref 4.22–5.81)
RDW: 17.3 % — ABNORMAL HIGH (ref 11.5–15.5)
WBC: 10.6 10*3/uL — ABNORMAL HIGH (ref 4.0–10.5)
nRBC: 0 % (ref 0.0–0.2)

## 2023-05-30 LAB — TROPONIN I (HIGH SENSITIVITY): Troponin I (High Sensitivity): 6 ng/L (ref <18)

## 2023-05-30 MED ORDER — ALUM & MAG HYDROXIDE-SIMETH 200-200-20 MG/5ML PO SUSP
30.0000 mL | Freq: Once | ORAL | Status: AC
  Administered 2023-05-30: 30 mL via ORAL
  Filled 2023-05-30: qty 30

## 2023-05-30 MED ORDER — MORPHINE SULFATE (PF) 4 MG/ML IV SOLN
4.0000 mg | Freq: Once | INTRAVENOUS | Status: AC
  Administered 2023-05-30: 4 mg via INTRAMUSCULAR
  Filled 2023-05-30: qty 1

## 2023-05-30 NOTE — ED Triage Notes (Signed)
Pt here with chest pain onset Friday. Pain is central chest, non radiating, intermittent in nature. Pt MWF dialysis pt, last tx Friday.

## 2023-05-30 NOTE — ED Provider Notes (Signed)
[place] EMERGENCY DEPARTMENT AT [facility] Provider Note   CSN: [id number] Arrival date & time: 05/30/23  1601     History  Chief Complaint  Patient presents with   Chest Pain    [name] is a 37 y.o. male.  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 He states that same began on Friday and has been intermittent since then. His pain started when he was attempting to eat soft foods. He states that soon after he had 1 episode of vomiting. Denies hematemesis. He notes persistent substernal pain that moves to his left side. Since onset of the pain he has not wanted to eat anything due to fear of worsening pain.  He is on Carafate, Prilosec, and Tums and when he takes this his pain does resolve completely, however it comes back sooner than when it is time for another dose.  He is also on tramadol which is helping as well.  He notes that he was admitted for this same pain at [facility] from 10/14 - 10/16 and was given pain medications and discharged when he could tolerate a diet again. 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  He has been managing with Carafate suspension, Prilosec, and Tums.  He states that since then his abdominal pain has resolved. His chest pain began at the beginning of October. He has had several negative imaging studies since then. Has not had a repeat endoscopy. He called his surgeon yesterday and was told to come to [facility] to be seen, however he notes that he wanted to come here instead for 'a second opinion.' He is able to tolerate clear liquids and has been drinking protein shakes as well, but is unable to tolerate soft foods. He denies any shortness of breath. No cardiac history. Has not missed any dialysis sessions. Has not had any more nausea or vomiting since Friday.   The  history is provided by the patient. No language interpreter was used.  Chest Pain      Home Medications Prior to Admission medications   Medication Sig Start Date End Date Taking? Authorizing Provider  acetaminophen (TYLENOL) 160 MG/5ML solution Take by mouth. 03/13/23   [provider]  albuterol (PROVENTIL HFA;VENTOLIN HFA) 108 (90 Base) MCG/ACT inhaler Inhale 2 puffs into the lungs every 4 (four) hours as needed for wheezing or shortness of breath. 05/14/16   [name], [name]  b complex-vitamin c-folic acid (NEPHRO-VITE) 0.8 MG TABS tablet Take 1 tablet by mouth daily.  08/16/16   [provider]  calcium carbonate (TUMS EX) 750 MG chewable tablet Chew 4 tablets by mouth 3 (three) times daily.    [provider]  diclofenac Sodium (VOLTAREN) 1 % GEL Apply topically. 03/13/23   [provider]  Doxercalciferol (HECTOROL IV) Doxercalciferol (Hectorol) 03/17/23 03/15/24  [provider]  famotidine (PEPCID) 20 MG tablet Take 1 tablet (20 mg total) by mouth daily. 04/27/19   [name], [name]  gabapentin (NEURONTIN) 250 MG/5ML solution Take by mouth. 03/12/23   [provider]  meloxicam (MOBIC) 15 MG tablet Take 0.5-1 tablets (7.5-15 mg total) by mouth daily as needed for pain. 08/01/19   [name], [name], MD  methocarbamol (ROBAXIN) 500 MG tablet Take by mouth. 03/13/23   [provider]  metoCLOPramide (REGLAN) 10 MG  tablet Take 0.5 tablets (5 mg total) by mouth every 12 (twelve) hours as needed for up to 10 doses for refractory nausea / vomiting. 05/08/23   Terald Sleeper, MD  metoCLOPramide (REGLAN) 5 MG tablet Take 1 tablet (5 mg total) by mouth 2 (two) times daily as needed for nausea. 04/27/19   [name], [name]  mupirocin cream (BACTROBAN) 2 % Apply 1 Application topically daily.    [provider]  NEXIUM 40 MG capsule Take by mouth. 03/12/23   [provider]  omeprazole (PRILOSEC) 20 MG capsule  Take 1 capsule by mouth daily. 03/21/23   [provider]  ondansetron (ZOFRAN-ODT) 4 MG disintegrating tablet Take by mouth. 03/13/23   [provider]  ondansetron (ZOFRAN-ODT) 4 MG disintegrating tablet Take 1 tablet (4 mg total) by mouth every 8 (eight) hours as needed for nausea or vomiting. 04/03/23   [name], [name], MD  oxyCODONE (ROXICODONE) 5 MG/5ML solution Take by mouth. 03/13/23   [provider]  OZEMPIC, 0.25 OR 0.5 MG/DOSE, 2 MG/1.5ML SOPN Inject 0.5 mg into the skin once a week. 05/17/21   [provider]  pantoprazole (PROTONIX) 40 MG tablet Take 1 tablet (40 mg total) by mouth daily. Take 30 minutes before breakfast. 05/08/19   Unk [name], PA  sevelamer carbonate (RENVELA) 2.4 G PACK Take 2.4 g by mouth 3 (three) times daily with meals.    [provider]  simethicone (MYLICON) 80 MG chewable tablet Chew by mouth. 03/12/23   [provider]  tiZANidine (ZANAFLEX) 4 MG tablet Take 4 mg by mouth every 8 (eight) hours as needed for muscle spasms.  01/11/18   [provider]  traMADol (ULTRAM) 50 MG tablet Take 1 tablet (50 mg total) by mouth every 12 (twelve) hours as needed. 12/02/22   [name], MD  traMADol (ULTRAM) 50 MG tablet Take 1 tablet (50 mg total) by mouth every 8 (eight) hours as needed for up to 12 doses for severe pain. 05/08/23   Terald Sleeper, MD  VELPHORO 500 MG chewable tablet Chew 500 mg by mouth 3 (three) times daily with meals. & twice daily with snacks 08/18/21   [provider]      Allergies    Dilaudid [hydromorphone hcl], Amlodipine, Ascorbate, and Midodrine    Review of Systems   Review of Systems  Cardiovascular:  Positive for chest pain.  All other systems reviewed and are negative.   Physical Exam Updated Vital Signs BP 114/80   Pulse (!) 101   Temp 98.3 F (36.8 C) (Oral)   Resp 18   SpO2 100%  Physical Exam Vitals and nursing note reviewed.   Constitutional:      General: He is not in acute distress.    Appearance: Normal appearance. He is normal weight. He is not ill-appearing, toxic-appearing or diaphoretic.  HENT:     Head: Normocephalic and atraumatic.  Cardiovascular:     Rate and Rhythm: Normal rate and regular rhythm.     Pulses:          Dorsalis pedis pulses are 2+ on the right side and 2+ on the left side.       Posterior tibial pulses are 2+ on the right side and 2+ on the left side.     Heart sounds: Normal heart sounds.  Pulmonary:     Effort: Pulmonary effort is normal. No respiratory distress.     Breath sounds: Normal breath sounds.  Chest:     Chest wall: No tenderness.  Abdominal:     Palpations: Abdomen is soft.     Tenderness: There is no abdominal tenderness.  Musculoskeletal:        General: Normal range of motion.     Cervical back: Normal range of motion.     Right lower leg: No tenderness. No edema.     Left lower leg: No tenderness. No edema.  Skin:    General: Skin is warm and dry.  Neurological:     General: No focal deficit present.     Mental Status: He is alert.  Psychiatric:        Mood and Affect: Mood normal.        Behavior: Behavior normal.     ED Results / Procedures / Treatments   Labs (all labs ordered are listed, but only abnormal results are displayed) Labs Reviewed  BASIC METABOLIC PANEL - Abnormal; Notable for the following components:      Result Value   Creatinine, Ser 7.24 (*)    GFR, Estimated 9 (*)    All other components within normal limits  CBC - Abnormal; Notable for the following components:   WBC 10.6 (*)    RBC 3.44 (*)    Hemoglobin 10.4 (*)    HCT 33.1 (*)    RDW 17.3 (*)    All other components within normal limits  TROPONIN I (HIGH SENSITIVITY)    EKG None  Radiology DG Chest 2 View  Result Date: 05/30/2023 CLINICAL DATA:  Chest pain EXAM: CHEST - 2 VIEW COMPARISON:  May 08, 2023, March 14, 2019 FINDINGS: The cardiomediastinal  silhouette is unchanged in contour. No pleural effusion. No pneumothorax. No acute pleuroparenchymal abnormality. Visualized abdomen is unremarkable. No acute osseous abnormality. RIGHT neck surgical clips. IMPRESSION: No acute cardiopulmonary abnormality. Electronically Signed   By: Meda Klinefelter M.D.   On: 05/30/2023 17:15    Procedures Procedures    Medications Ordered in ED Medications  morphine (PF) 4 MG/ML injection 4 mg (4 mg Intramuscular Given 05/30/23 1910)  alum & mag hydroxide-simeth (MAALOX/MYLANTA) 200-200-20 MG/5ML suspension 30 mL (30 mLs Oral Given 05/30/23 1945)    ED Course/ Medical Decision Making/ A&P                                 Medical Decision Making Amount and/or Complexity of Data Reviewed Labs: ordered. Radiology: ordered.  Risk OTC drugs. Prescription drug management.   This patient is a 37 y.o. male who presents to the ED for concern of chest pain, this involves an extensive number of treatment options, and is a complaint that carries with it a high risk of complications and morbidity. The emergent differential diagnosis prior to evaluation includes, but is not limited to,  ACS, pericarditis, myocarditis, aortic dissection, PE, pneumothorax, esophageal spasm or rupture, chronic angina, pneumonia, bronchitis, GERD, reflux/PUD, biliary disease, pancreatitis, costochondritis, anxiety   This is not an exhaustive differential.   Past Medical History / Co-morbidities / Social History:  has a past medical history of Arthritis, Chronic lower back pain, Complication of anesthesia, Dizziness, ESRD (end stage renal disease) on dialysis (HCC), Family history of anesthesia complication, GERD (gastroesophageal reflux disease), Headache(784.0), History of hiatal hernia, Hyperparathyroidism (HCC), Hypertension, Joint pain, Joint swelling, Morbid obesity (HCC), PONV (postoperative nausea and vomiting), and Thyroid disease.  Additional history: Chart reviewed.  Pertinent results include: Admitted to [facility] from  10/14-10/16 for similar pain with po intolerance. Work-up negative, diet advanced, sent home with carafate and omeprazole. Did have an ulcer on EGD in September as well.   Physical Exam: Physical exam performed. The pertinent findings include: Well appearing, no acute physical exam abnormalities  Lab Tests: I ordered, and personally interpreted labs.  The pertinent results include:  WBC 10.6. Kidney function at baseline. Troponin WNL   Imaging Studies: I ordered imaging studies including DG Chest. I independently visualized and interpreted imaging which showed NAD. I agree with the radiologist interpretation.   Cardiac Monitoring:  The patient was maintained on a cardiac monitor. Cardiac monitor showed an underlying rhythm of: sinus rhythm, no STEMI. I agree with this interpretation.   Medications: I ordered medication including morphine and GI cocktail  for pain. Reevaluation of the patient after these medicines showed that the patient resolved. I have reviewed the patients home medicines and have made adjustments as needed.  Disposition: After consideration of the diagnostic results and the patients response to treatment, I feel that emergency department workup does not suggest an emergent condition requiring admission or immediate intervention beyond what has been performed at this time. The plan is: discharge with close outpatient follow-up with his surgeon at [facility], close return precautions. Patients symptoms resolved with GI cocktail. Pain similar to previous admission to [facility] for post-surgical pain. No shortness of breath. Work-up is benign. HEART Score 2, low risk. Doubt cardiac etiology. Low suspicion for PE. He is able to eat and drink without nausea or vomiting. He has an appointment with his surgical team at [facility] first thing in the morning. Emphasized importance of discussing these symptoms at his appointment. No indication to reach out to his  specialists emergently. He has plenty of his home reflux medications and is already on prilosec, carafate, and tums which give him relief. Will leave adding additional meds to his main team tomorrow. After meds, he feels like he can go home and follow-up outpatient. Evaluation and diagnostic testing in the emergency department does not suggest an emergent condition requiring admission or immediate intervention beyond what has been performed at this time.  Plan for discharge with close PCP follow-up.  Patient is understanding and amenable with plan, educated on red flag symptoms that would prompt immediate return.  Patient discharged in stable condition.  Final Clinical Impression(s) / ED Diagnoses Final diagnoses:  Precordial chest pain    Rx / DC Orders ED Discharge Orders     None     An After Visit Summary was printed and given to the patient.     Vear Clock 05/30/23 2101    Charlynne Pander, MD 05/30/23 [phone]

## 2023-05-30 NOTE — Discharge Instructions (Signed)
As we discussed, your workup in the ER today was reassuring for acute findings.  Laboratory evaluation, x-ray imaging and EKG did not reveal any emergent cause of your symptoms.  Given that this seems to be related to your chronic pain postsurgery, I recommend that you see your surgeon at [facility] to further discuss management moving forward of your symptoms.  Please do so at your appointment in the morning.  In the interim, please continue to take the medications that you have been using for relief of your symptoms.  Return if development of any new or worsening symptoms.

## 2023-06-09 ENCOUNTER — Encounter (HOSPITAL_COMMUNITY): Payer: Self-pay | Admitting: Emergency Medicine

## 2023-06-09 ENCOUNTER — Emergency Department (HOSPITAL_COMMUNITY): Payer: 59

## 2023-06-09 ENCOUNTER — Emergency Department (HOSPITAL_COMMUNITY)
Admission: EM | Admit: 2023-06-09 | Discharge: 2023-06-09 | Disposition: A | Discharge status: Home / Self Care | Payer: 59 | Attending: Emergency Medicine | Admitting: Emergency Medicine

## 2023-06-09 ENCOUNTER — Other Ambulatory Visit: Payer: Self-pay

## 2023-06-09 DIAGNOSIS — Z992 Dependence on renal dialysis: Secondary | ICD-10-CM | POA: Diagnosis not present

## 2023-06-09 DIAGNOSIS — K289 Gastrojejunal ulcer, unspecified as acute or chronic, without hemorrhage or perforation: Secondary | ICD-10-CM

## 2023-06-09 DIAGNOSIS — Z79899 Other long term (current) drug therapy: Secondary | ICD-10-CM | POA: Insufficient documentation

## 2023-06-09 DIAGNOSIS — K283 Acute gastrojejunal ulcer without hemorrhage or perforation: Secondary | ICD-10-CM | POA: Diagnosis not present

## 2023-06-09 DIAGNOSIS — N186 End stage renal disease: Secondary | ICD-10-CM | POA: Insufficient documentation

## 2023-06-09 DIAGNOSIS — I120 Hypertensive chronic kidney disease with stage 5 chronic kidney disease or end stage renal disease: Secondary | ICD-10-CM | POA: Diagnosis not present

## 2023-06-09 DIAGNOSIS — R1012 Left upper quadrant pain: Secondary | ICD-10-CM

## 2023-06-09 DIAGNOSIS — R0789 Other chest pain: Secondary | ICD-10-CM | POA: Diagnosis present

## 2023-06-09 LAB — CBC WITH DIFFERENTIAL/PLATELET
Abs Immature Granulocytes: 0.04 10*3/uL (ref 0.00–0.07)
Basophils Absolute: 0.1 10*3/uL (ref 0.0–0.1)
Basophils Relative: 1 %
Eosinophils Absolute: 0.2 10*3/uL (ref 0.0–0.5)
Eosinophils Relative: 3 %
HCT: 30.7 % — ABNORMAL LOW (ref 39.0–52.0)
Hemoglobin: 9.6 g/dL — ABNORMAL LOW (ref 13.0–17.0)
Immature Granulocytes: 1 %
Lymphocytes Relative: 23 %
Lymphs Abs: 1.7 10*3/uL (ref 0.7–4.0)
MCH: 29.7 pg (ref 26.0–34.0)
MCHC: 31.3 g/dL (ref 30.0–36.0)
MCV: 95 fL (ref 80.0–100.0)
Monocytes Absolute: 0.8 10*3/uL (ref 0.1–1.0)
Monocytes Relative: 11 %
Neutro Abs: 4.4 10*3/uL (ref 1.7–7.7)
Neutrophils Relative %: 61 %
Platelets: 222 10*3/uL (ref 150–400)
RBC: 3.23 MIL/uL — ABNORMAL LOW (ref 4.22–5.81)
RDW: 16.9 % — ABNORMAL HIGH (ref 11.5–15.5)
WBC: 7.2 10*3/uL (ref 4.0–10.5)
nRBC: 0 % (ref 0.0–0.2)

## 2023-06-09 LAB — COMPREHENSIVE METABOLIC PANEL
ALT: 11 U/L (ref 0–44)
AST: 12 U/L — ABNORMAL LOW (ref 15–41)
Albumin: 2.6 g/dL — ABNORMAL LOW (ref 3.5–5.0)
Alkaline Phosphatase: 77 U/L (ref 38–126)
Anion gap: 10 (ref 5–15)
BUN: 13 mg/dL (ref 6–20)
CO2: 27 mmol/L (ref 22–32)
Calcium: 9.7 mg/dL (ref 8.9–10.3)
Chloride: 103 mmol/L (ref 98–111)
Creatinine, Ser: 7.44 mg/dL — ABNORMAL HIGH (ref 0.61–1.24)
GFR, Estimated: 9 mL/min — ABNORMAL LOW (ref >60)
Glucose, Bld: 78 mg/dL (ref 70–99)
Potassium: 5.3 mmol/L — ABNORMAL HIGH (ref 3.5–5.1)
Sodium: 140 mmol/L (ref 135–145)
Total Bilirubin: 0.8 mg/dL (ref <1.2)
Total Protein: 6 g/dL — ABNORMAL LOW (ref 6.5–8.1)

## 2023-06-09 LAB — TROPONIN I (HIGH SENSITIVITY): Troponin I (High Sensitivity): 7 ng/L (ref <18)

## 2023-06-09 LAB — MAGNESIUM: Magnesium: 2.1 mg/dL (ref 1.7–2.4)

## 2023-06-09 LAB — LIPASE, BLOOD: Lipase: 23 U/L (ref 11–51)

## 2023-06-09 MED ORDER — MORPHINE SULFATE (PF) 4 MG/ML IV SOLN
4.0000 mg | Freq: Once | INTRAVENOUS | Status: AC
  Administered 2023-06-09: 4 mg via INTRAVENOUS
  Filled 2023-06-09: qty 1

## 2023-06-09 MED ORDER — LIDOCAINE VISCOUS HCL 2 % MT SOLN
15.0000 mL | Freq: Once | OROMUCOSAL | Status: AC
  Administered 2023-06-09: 15 mL via ORAL
  Filled 2023-06-09: qty 15

## 2023-06-09 MED ORDER — PANTOPRAZOLE SODIUM 40 MG IV SOLR
40.0000 mg | Freq: Once | INTRAVENOUS | Status: AC
  Administered 2023-06-09: 40 mg via INTRAVENOUS
  Filled 2023-06-09: qty 10

## 2023-06-09 MED ORDER — ALUM & MAG HYDROXIDE-SIMETH 200-200-20 MG/5ML PO SUSP
30.0000 mL | Freq: Once | ORAL | Status: AC
  Administered 2023-06-09: 30 mL via ORAL
  Filled 2023-06-09: qty 30

## 2023-06-09 NOTE — Discharge Instructions (Signed)
Thank you for coming to [facility] Emergency Department. You were seen for chest/abdominal pain. We did an exam, labs, and imaging, and these showed no acute findings. Likely your pain is from your known ulcer. Please go to dialysis after you leave the ED today.  Please follow up with GI as previously scheduled. You can take your home medications including nausea medicines as prescribed.   Do not hesitate to return to the ED or call 911 if you experience: -Worsening symptoms -Severe nausea/vomiting, inability to eat/drink -Lightheadedness, passing out -Fevers/chills -Anything else that concerns you

## 2023-06-09 NOTE — ED Triage Notes (Signed)
Pt BIB [facility] from dialysis due to chest pain that started this morning with abdominal pain. Pt did not get dialyzed today.  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 Pt had gastric bypass surgery in August 2024 and pains have been intermittent since then.  Pt has been seen here for same in other occasions.  Medications given by EMS: 0.4 nitroglycerin; 324 aspirin. VS BP 107/70, HR 90, SpO2 99%

## 2023-06-09 NOTE — ED Notes (Signed)
PA Thomasena Edis and MD Jearld Fenton notified and aware he would have to be at [facility] by 1315 today to be dialyzed.  Awaiting for further instructions from providers.

## 2023-06-09 NOTE — ED Provider Notes (Signed)
[facility]MENT AT [facility] Provider Note   CSN: [id number] Arrival date & time: 06/09/23  9562     History  Chief Complaint  Patient presents with   Chest Pain    [name] is a 37 y.o. male with Chronic renal failure (HD dependent), laparoscopic gastric bypass on 03/04/23  who is BIB [facility] from dialysis due to chest pain that started this morning with abdominal pain. Same pain as he has been experiencing. Started on Friday, improved, but then got worse on Saturday. Helped by his carafate, prilosec, and tums for a few minutes but then the pain returns. Pt did not get dialyzed today d/t pain, now rated 9/10. Not exertional, no radiation.  No hematemesis, fever/chills, cough, shortness of breath, diarrhea/constipation.  Patient does not make urine. Pt had gastric bypass surgery in August 2024 and pains have been intermittent since then.  Pt has been seen here for same in other occasions.  Per chart review was seen  here on 05/30/2023 for chest pain and more recently on 06/04/23 at [facility] ED.  Noted at that time that he is on Carafate, Prilosec, and Tums and when he takes those his pain resolves completely however it comes back after that.  Was admitted from 10/14 to 05/19/2023 at [facility] for the same pain and was discharged when he could tolerate a diet again.  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  He is scheduled for an endoscopy on 06/29/2023 at [facility].   Medications given by EMS: 0.4 nitroglycerin; 324 aspirin. VS BP 107/70, HR 90, SpO2 99%    Past Medical History:  Diagnosis Date   Arthritis    "all over" (11/19/2016)   Chronic lower back pain    Complication of anesthesia    A little while to wake up after knee surgery in 2008   Dizziness    when coming off of dialysis   ESRD (end stage renal disease) on dialysis [facility])    MWF and goes to [employer] [facility] (11/19/2016)   Family history of anesthesia  complication    mom slow to wake up   GERD (gastroesophageal reflux disease)    takes Omeprazole as needed   Headache(784.0)    "monthly" (11/19/2016)   History of hiatal hernia    Hyperparathyroidism (HCC)    Hypertension    Joint pain    Joint swelling    Morbid obesity (HCC)    PONV (postoperative nausea and vomiting)    Thyroid disease        Home Medications Prior to Admission medications   Medication Sig Start Date End Date Taking? Authorizing Provider  acetaminophen (TYLENOL) 160 MG/5ML solution Take by mouth. 03/13/23   [provider]  albuterol (PROVENTIL HFA;VENTOLIN HFA) 108 (90 Base) MCG/ACT inhaler Inhale 2 puffs into the lungs every 4 (four) hours as needed for wheezing or shortness of breath. 05/14/16   [name], [name]  b complex-vitamin c-folic acid (NEPHRO-VITE) 0.8 MG TABS tablet Take 1 tablet by mouth daily.  08/16/16   [provider]  calcium carbonate (TUMS EX) 750 MG chewable tablet Chew 4 tablets by mouth 3 (three) times daily.    [provider]  diclofenac Sodium (VOLTAREN) 1 % GEL Apply topically. 03/13/23   [provider]  Doxercalciferol (HECTOROL IV) Doxercalciferol (Hectorol) 03/17/23 03/15/24  [provider]  famotidine (PEPCID) 20 MG tablet Take 1 tablet (20 mg total) by  mouth daily. 04/27/19   [name], [name]  gabapentin (NEURONTIN) 250 MG/5ML solution Take by mouth. 03/12/23   [provider]  meloxicam (MOBIC) 15 MG tablet Take 0.5-1 tablets (7.5-15 mg total) by mouth daily as needed for pain. 08/01/19   [name], [name], MD  methocarbamol (ROBAXIN) 500 MG tablet Take by mouth. 03/13/23   [provider]  metoCLOPramide (REGLAN) 10 MG tablet Take 0.5 tablets (5 mg total) by mouth every 12 (twelve) hours as needed for up to 10 doses for refractory nausea / vomiting. 05/08/23   Terald Sleeper, MD  metoCLOPramide (REGLAN) 5 MG tablet Take 1 tablet (5 mg total) by mouth 2 (two)  times daily as needed for nausea. 04/27/19   [name], [name]  mupirocin cream (BACTROBAN) 2 % Apply 1 Application topically daily.    [provider]  NEXIUM 40 MG capsule Take by mouth. 03/12/23   [provider]  omeprazole (PRILOSEC) 20 MG capsule Take 1 capsule by mouth daily. 03/21/23   [provider]  ondansetron (ZOFRAN-ODT) 4 MG disintegrating tablet Take by mouth. 03/13/23   [provider]  ondansetron (ZOFRAN-ODT) 4 MG disintegrating tablet Take 1 tablet (4 mg total) by mouth every 8 (eight) hours as needed for nausea or vomiting. 04/03/23   [name], [name], MD  oxyCODONE (ROXICODONE) 5 MG/5ML solution Take by mouth. 03/13/23   [provider]  OZEMPIC, 0.25 OR 0.5 MG/DOSE, 2 MG/1.5ML SOPN Inject 0.5 mg into the skin once a week. 05/17/21   [provider]  pantoprazole (PROTONIX) 40 MG tablet Take 1 tablet (40 mg total) by mouth daily. Take 30 minutes before breakfast. 05/08/19   Unk [name], PA  sevelamer carbonate (RENVELA) 2.4 G PACK Take 2.4 g by mouth 3 (three) times daily with meals.    [provider]  simethicone (MYLICON) 80 MG chewable tablet Chew by mouth. 03/12/23   [provider]  tiZANidine (ZANAFLEX) 4 MG tablet Take 4 mg by mouth every 8 (eight) hours as needed for muscle spasms.  01/11/18   [provider]  traMADol (ULTRAM) 50 MG tablet Take 1 tablet (50 mg total) by mouth every 12 (twelve) hours as needed. 12/02/22   [name], MD  traMADol (ULTRAM) 50 MG tablet Take 1 tablet (50 mg total) by mouth every 8 (eight) hours as needed for up to 12 doses for severe pain. 05/08/23   Terald Sleeper, MD  VELPHORO 500 MG chewable tablet Chew 500 mg by mouth 3 (three) times daily with meals. & twice daily with snacks 08/18/21   [provider]      Allergies    Dilaudid [hydromorphone hcl], Amlodipine, Ascorbate, and Midodrine    Review of Systems   Review of  Systems A 10 point review of systems was performed and is negative unless otherwise reported in HPI.  Physical Exam Updated Vital Signs BP 110/71   Pulse 81   Temp 97.7 F (36.5 C) (Oral)   Resp 13   Ht 5\' 5"  (1.651 m)   Wt 102.1 kg   SpO2 100%   BMI 37.44 kg/m  Physical Exam General: Normal appearing male, lying in bed.  HEENT: Sclera anicteric, MMM, trachea midline.  Cardiology: RRR, no murmurs/rubs/gallops. BL radial and DP pulses equal bilaterally.  Resp: Normal respiratory rate and effort. CTAB, no wheezes, rhonchi, crackles.  Abd: LUQ/epigastric TTP. Soft, non-distended. No rebound tenderness or guarding.  GU: Deferred. MSK: No peripheral edema or  signs of trauma. Extremities without deformity or TTP.  AV fistula in LUE with palpable thrill. Skin: warm, dry.  Back: No CVA tenderness Neuro: A&Ox4, CNs II-XII grossly intact. MAEs. Sensation grossly intact.  Psych: Normal mood and affect.   ED Results / Procedures / Treatments   Labs (all labs ordered are listed, but only abnormal results are displayed) Labs Reviewed  CBC WITH DIFFERENTIAL/PLATELET - Abnormal; Notable for the following components:      Result Value   RBC 3.23 (*)    Hemoglobin 9.6 (*)    HCT 30.7 (*)    RDW 16.9 (*)    All other components within normal limits  COMPREHENSIVE METABOLIC PANEL - Abnormal; Notable for the following components:   Potassium 5.3 (*)    Creatinine, Ser 7.44 (*)    Total Protein 6.0 (*)    Albumin 2.6 (*)    AST 12 (*)    GFR, Estimated 9 (*)    All other components within normal limits  MAGNESIUM  LIPASE, BLOOD  TROPONIN I (HIGH SENSITIVITY)    EKG EKG Interpretation Date/Time:  Wednesday June 09 2023 08:03:41 EST Ventricular Rate:  86 PR Interval:  153 QRS Duration:  99 QT Interval:  341 QTC Calculation: 408 R Axis:   7  Text Interpretation: Sinus rhythm Confirmed by Kommor, Madison ([id number]) on 06/10/2023 10:35:48 AM  Radiology No results  found.  Procedures Procedures    Medications Ordered in ED Medications  pantoprazole (PROTONIX) injection 40 mg (40 mg Intravenous Given 06/09/23 0933)  morphine (PF) 4 MG/ML injection 4 mg (4 mg Intravenous Given 06/09/23 0934)  alum & mag hydroxide-simeth (MAALOX/MYLANTA) 200-200-20 MG/5ML suspension 30 mL (30 mLs Oral Given 06/09/23 1118)    And  lidocaine (XYLOCAINE) 2 % viscous mouth solution 15 mL (15 mLs Oral Given 06/09/23 1118)    ED Course/ Medical Decision Making/ A&P                          Medical Decision Making Amount and/or Complexity of Data Reviewed Labs: ordered. Decision-making details documented in ED Course. Radiology: ordered.  Risk OTC drugs. Prescription drug management.    This patient presents to the ED for concern of Cp/abd pain, this involves an extensive number of treatment options, and is a complaint that carries with it a high risk of complications and morbidity.  I considered the following differential and admission for this acute, potentially life threatening condition.   MDM:    Patient presents with chest and abdominal pain with epigastric/left upper quadrant tenderness palpation in the setting of known gastrojejunal ulcer with planned endoscopy in a couple of [place] already on Carafate, Prilosec, and Tums which does help his pain somewhat.  I suspect this is likely the cause of his pain today as he states that is the same as before.  Per chart review morphine and Maalox/viscous lidocaine have improved patient's symptoms in the past.  Will give morphine, Protonix IV and obtain chest x-ray  Clinical Course as of 06/14/23 0834  Wed Jun 09, 2023  1049 Creatinine(!): 7.44 Known ESRD [HN]  1049 Potassium(!): 5.3 Mild hyperkalemia [HN]  1049 Magnesium: 2.1 [HN]  1049 Lipase: 23 [HN]  1049 Troponin I (High Sensitivity): 7 wnl [HN]  1058 Unremarkable CXR [HN]  1143 Pt feeling improved, pain now lessening, now 5/10. He states he cannot get dialysis  today and he cannot get it tomorrow as he has an appt at [facility] tomorrow  morning. He will call dialysis center now. [HN]  1207 Patient's dialysis center has a spot for him at 1:15 pm. Patient's pain improving still after protonix, maalox/lido. Patient's BP is soft, 91/59, likely d/t decreased PO intake and his pain d/t his known ulcer as before. Additionally, patient states that he doesn't have transportation to dialysis and no one can pick him up.  [HN]  1225 Pt was able to find friend to give him ride to dialysis center but he is intermittently hypotensive. Likely needs some volume back d/t nausea/vomiting he has been experiencing. D/w nephrology. [HN]  1229 D/w Nephrology Dr. Allena Katz who states that patient is well known to them from the OP HD unit. He is notorious for running asymptomatic "soft" blood pressures at dialysis. Dr. Allena Katz spoke to the OP HD unit staff and they are able to accommodate him for dialysis this afternoon (they are also comfortable dialyzing him with that blood pressure). Should be okay to get him out without need for admission.   DC w/ discharge instructions/return precautions. All questions answered to patient's satisfaction.   [HN]    Clinical Course User Index [HN] Loetta Rough, MD    Labs: I Ordered, and personally interpreted labs.  The pertinent results include:  those listed above  Imaging Studies ordered: I ordered imaging studies including CXR I independently visualized and interpreted imaging. I agree with the radiologist interpretation  Additional history obtained from chart review, nephrologist.   Cardiac Monitoring: The patient was maintained on a cardiac monitor.  I personally viewed and interpreted the cardiac monitored which showed an underlying rhythm of: NSR  Reevaluation: After the interventions noted above, I reevaluated the patient and found that they have :improved  Social Determinants of Health: Lives independently  Disposition:  DC w/  discharge instructions/return precautions. All questions answered to patient's satisfaction.  Going to dialysis this afternoon.  Co morbidities that complicate the patient evaluation  Past Medical History:  Diagnosis Date   Arthritis    "all over" (11/19/2016)   Chronic lower back pain    Complication of anesthesia    A little while to wake up after knee surgery in 2008   Dizziness    when coming off of dialysis   ESRD (end stage renal disease) on dialysis [facility])    MWF and goes to [employer] (11/19/2016)   Family history of anesthesia complication    mom slow to wake up   GERD (gastroesophageal reflux disease)    takes Omeprazole as needed   Headache(784.0)    "monthly" (11/19/2016)   History of hiatal hernia    Hyperparathyroidism (HCC)    Hypertension    Joint pain    Joint swelling    Morbid obesity (HCC)    PONV (postoperative nausea and vomiting)    Thyroid disease      Medicines Meds ordered this encounter  Medications   pantoprazole (PROTONIX) injection 40 mg   morphine (PF) 4 MG/ML injection 4 mg   AND Linked Order Group    alum & mag hydroxide-simeth (MAALOX/MYLANTA) 200-200-20 MG/5ML suspension 30 mL    lidocaine (XYLOCAINE) 2 % viscous mouth solution 15 mL    I have reviewed the patients home medicines and have made adjustments as needed  Problem List / ED Course: Problem List Items Addressed This Visit       Other   Abdominal pain   Other Visit Diagnoses     Atypical chest pain    -  Primary  Gastrojejunal ulcer                       This note was created using dictation software, which may contain spelling or grammatical errors.    Loetta Rough, MD 06/14/23 [phone]

## 2023-06-09 NOTE — ED Notes (Signed)
IV team at bedside 

## 2023-06-09 NOTE — ED Notes (Signed)
This RN and Glee Arvin, RN tried to start IV; unsuccessful.  IV team consult placed.  MD Jearld Fenton notified and aware.

## 2023-06-28 ENCOUNTER — Emergency Department (HOSPITAL_BASED_OUTPATIENT_CLINIC_OR_DEPARTMENT_OTHER): Payer: 59 | Admitting: Radiology

## 2023-06-28 ENCOUNTER — Emergency Department (HOSPITAL_BASED_OUTPATIENT_CLINIC_OR_DEPARTMENT_OTHER)
Admission: EM | Admit: 2023-06-28 | Discharge: 2023-06-28 | Disposition: A | Discharge status: Home / Self Care | Payer: 59 | Attending: Emergency Medicine | Admitting: Emergency Medicine

## 2023-06-28 ENCOUNTER — Encounter (HOSPITAL_BASED_OUTPATIENT_CLINIC_OR_DEPARTMENT_OTHER): Payer: Self-pay | Admitting: Emergency Medicine

## 2023-06-28 ENCOUNTER — Other Ambulatory Visit: Payer: Self-pay

## 2023-06-28 ENCOUNTER — Emergency Department (HOSPITAL_BASED_OUTPATIENT_CLINIC_OR_DEPARTMENT_OTHER): Payer: 59

## 2023-06-28 DIAGNOSIS — Z992 Dependence on renal dialysis: Secondary | ICD-10-CM | POA: Diagnosis not present

## 2023-06-28 DIAGNOSIS — R1013 Epigastric pain: Secondary | ICD-10-CM | POA: Diagnosis not present

## 2023-06-28 DIAGNOSIS — R112 Nausea with vomiting, unspecified: Secondary | ICD-10-CM | POA: Insufficient documentation

## 2023-06-28 DIAGNOSIS — I120 Hypertensive chronic kidney disease with stage 5 chronic kidney disease or end stage renal disease: Secondary | ICD-10-CM | POA: Diagnosis not present

## 2023-06-28 DIAGNOSIS — R0789 Other chest pain: Secondary | ICD-10-CM | POA: Insufficient documentation

## 2023-06-28 DIAGNOSIS — N186 End stage renal disease: Secondary | ICD-10-CM | POA: Insufficient documentation

## 2023-06-28 DIAGNOSIS — R7989 Other specified abnormal findings of blood chemistry: Secondary | ICD-10-CM | POA: Insufficient documentation

## 2023-06-28 LAB — MAGNESIUM: Magnesium: 1.9 mg/dL (ref 1.7–2.4)

## 2023-06-28 LAB — BASIC METABOLIC PANEL
Anion gap: 16 — ABNORMAL HIGH (ref 5–15)
BUN: 7 mg/dL (ref 6–20)
CO2: 26 mmol/L (ref 22–32)
Calcium: 9.6 mg/dL (ref 8.9–10.3)
Chloride: 97 mmol/L — ABNORMAL LOW (ref 98–111)
Creatinine, Ser: 5.11 mg/dL — ABNORMAL HIGH (ref 0.61–1.24)
GFR, Estimated: 14 mL/min — ABNORMAL LOW (ref >60)
Glucose, Bld: 67 mg/dL — ABNORMAL LOW (ref 70–99)
Potassium: 4.7 mmol/L (ref 3.5–5.1)
Sodium: 139 mmol/L (ref 135–145)

## 2023-06-28 LAB — HEPATIC FUNCTION PANEL
ALT: 10 U/L (ref 0–44)
AST: 11 U/L — ABNORMAL LOW (ref 15–41)
Albumin: 3.2 g/dL — ABNORMAL LOW (ref 3.5–5.0)
Alkaline Phosphatase: 57 U/L (ref 38–126)
Bilirubin, Direct: 0.3 mg/dL — ABNORMAL HIGH (ref 0.0–0.2)
Indirect Bilirubin: 0.6 mg/dL (ref 0.3–0.9)
Total Bilirubin: 0.9 mg/dL (ref <1.2)
Total Protein: 6.3 g/dL — ABNORMAL LOW (ref 6.5–8.1)

## 2023-06-28 LAB — CBC
HCT: 35.9 % — ABNORMAL LOW (ref 39.0–52.0)
Hemoglobin: 11.5 g/dL — ABNORMAL LOW (ref 13.0–17.0)
MCH: 29.9 pg (ref 26.0–34.0)
MCHC: 32 g/dL (ref 30.0–36.0)
MCV: 93.5 fL (ref 80.0–100.0)
Platelets: 220 10*3/uL (ref 150–400)
RBC: 3.84 MIL/uL — ABNORMAL LOW (ref 4.22–5.81)
RDW: 16.7 % — ABNORMAL HIGH (ref 11.5–15.5)
WBC: 7.5 10*3/uL (ref 4.0–10.5)
nRBC: 0 % (ref 0.0–0.2)

## 2023-06-28 LAB — LIPASE, BLOOD: Lipase: 19 U/L (ref 11–51)

## 2023-06-28 LAB — TROPONIN I (HIGH SENSITIVITY)
Troponin I (High Sensitivity): 5 ng/L (ref <18)
Troponin I (High Sensitivity): 5 ng/L (ref <18)

## 2023-06-28 MED ORDER — OXYCODONE-ACETAMINOPHEN 5-325 MG PO TABS
1.0000 | ORAL_TABLET | Freq: Once | ORAL | Status: AC
  Administered 2023-06-28: 1 via ORAL
  Filled 2023-06-28: qty 1

## 2023-06-28 MED ORDER — METOCLOPRAMIDE HCL 5 MG/ML IJ SOLN
10.0000 mg | Freq: Once | INTRAMUSCULAR | Status: AC
  Administered 2023-06-28: 10 mg via INTRAVENOUS
  Filled 2023-06-28: qty 2

## 2023-06-28 MED ORDER — ALUM & MAG HYDROXIDE-SIMETH 200-200-20 MG/5ML PO SUSP
30.0000 mL | Freq: Once | ORAL | Status: AC
  Administered 2023-06-28: 30 mL via ORAL
  Filled 2023-06-28: qty 30

## 2023-06-28 MED ORDER — LIDOCAINE VISCOUS HCL 2 % MT SOLN
15.0000 mL | Freq: Once | OROMUCOSAL | Status: AC
  Administered 2023-06-28: 15 mL via ORAL
  Filled 2023-06-28: qty 15

## 2023-06-28 MED ORDER — FENTANYL CITRATE PF 50 MCG/ML IJ SOSY
50.0000 ug | PREFILLED_SYRINGE | Freq: Once | INTRAMUSCULAR | Status: AC
  Administered 2023-06-28: 50 ug via INTRAVENOUS
  Filled 2023-06-28: qty 1

## 2023-06-28 MED ORDER — FAMOTIDINE IN NACL 20-0.9 MG/50ML-% IV SOLN
20.0000 mg | Freq: Once | INTRAVENOUS | Status: AC
  Administered 2023-06-28: 20 mg via INTRAVENOUS
  Filled 2023-06-28: qty 50

## 2023-06-28 NOTE — ED Triage Notes (Signed)
Pt c/o generalized CP and n/v starting last night. Gastric bypass in Aug

## 2023-06-28 NOTE — Discharge Instructions (Signed)
Test results today are reassuring.  Continue your daily Nexium.  Take Reglan as needed for nausea.  Follow-up with your GI specialist for ongoing management and surveillance of your ulcer.  Return to the emergency department for any new or worsening symptoms of concern.

## 2023-06-28 NOTE — ED Provider Notes (Addendum)
[place] EMERGENCY DEPARTMENT AT [facility] Provider Note   CSN: [id number] Arrival date & time: 06/28/23  1054     History  Chief Complaint  Patient presents with   Chest Pain    [name] is a 37 y.o. male.   Chest Pain Associated symptoms: abdominal pain, nausea and vomiting   Patient presents for chest pain, nausea, vomiting.  Medical history includes arthritis, GERD, HTN, ESRD.  He typically undergoes dialysis on M, W, F.  This week he is on a holiday schedule.  He did undergo dialysis yesterday.  He underwent gastric bypass surgery on 8/1.  He underwent endoscopy and esophageal dilation on 9/26.  He underwent repeat endoscopy 6 days ago.  During endoscopic evaluations, patient was noted to have ulcers.  Most recently, he was told that 1 of 2 ulcers had resolved.  Because of these ulcerations, he has been on a liquid diet.  Starting yesterday afternoon, he has had a lower chest/epigastric discomfort with nausea and vomiting.  He has not taken anything for the pain at home.  He had mild improvement in symptoms last night.  Symptoms worsened today.  Current pain is 9/10 in severity.  Emesis is described as acid.  Patient reports that he has been on dialysis for the past 10 years.  He does make a very small amount of urine.      Home Medications Prior to Admission medications   Medication Sig Start Date End Date Taking? Authorizing Provider  acetaminophen (TYLENOL) 160 MG/5ML solution Take by mouth. 03/13/23   [provider]  albuterol (PROVENTIL HFA;VENTOLIN HFA) 108 (90 Base) MCG/ACT inhaler Inhale 2 puffs into the lungs every 4 (four) hours as needed for wheezing or shortness of breath. 05/14/16   [name], [name]  b complex-vitamin c-folic acid (NEPHRO-VITE) 0.8 MG TABS tablet Take 1 tablet by mouth daily.  08/16/16   [provider]  calcium carbonate (TUMS EX) 750 MG chewable tablet Chew 4 tablets by mouth 3 (three) times daily.     [provider]  diclofenac Sodium (VOLTAREN) 1 % GEL Apply topically. 03/13/23   [provider]  Doxercalciferol (HECTOROL IV) Doxercalciferol (Hectorol) 03/17/23 03/15/24  [provider]  famotidine (PEPCID) 20 MG tablet Take 1 tablet (20 mg total) by mouth daily. 04/27/19   [name], [name]  gabapentin (NEURONTIN) 250 MG/5ML solution Take by mouth. 03/12/23   [provider]  meloxicam (MOBIC) 15 MG tablet Take 0.5-1 tablets (7.5-15 mg total) by mouth daily as needed for pain. 08/01/19   [name], [name], MD  methocarbamol (ROBAXIN) 500 MG tablet Take by mouth. 03/13/23   [provider]  metoCLOPramide (REGLAN) 10 MG tablet Take 0.5 tablets (5 mg total) by mouth every 12 (twelve) hours as needed for up to 10 doses for refractory nausea / vomiting. 05/08/23   Terald Sleeper, MD  metoCLOPramide (REGLAN) 5 MG tablet Take 1 tablet (5 mg total) by mouth 2 (two) times daily as needed for nausea. 04/27/19   [name], [name]  mupirocin cream (BACTROBAN) 2 % Apply 1 Application topically daily.    [provider]  NEXIUM 40 MG capsule Take by mouth. 03/12/23   [provider]  omeprazole (PRILOSEC) 20 MG capsule Take 1 capsule by mouth daily. 03/21/23   [provider]  ondansetron (ZOFRAN-ODT) 4 MG disintegrating tablet Take by mouth. 03/13/23   [provider]  ondansetron (ZOFRAN-ODT) 4 MG disintegrating tablet Take 1 tablet (  4 mg total) by mouth every 8 (eight) hours as needed for nausea or vomiting. 04/03/23   [name], [name], MD  oxyCODONE (ROXICODONE) 5 MG/5ML solution Take by mouth. 03/13/23   [provider]  OZEMPIC, 0.25 OR 0.5 MG/DOSE, 2 MG/1.5ML SOPN Inject 0.5 mg into the skin once a week. 05/17/21   [provider]  pantoprazole (PROTONIX) 40 MG tablet Take 1 tablet (40 mg total) by mouth daily. Take 30 minutes before breakfast. 05/08/19   Unk [name], PA  sevelamer carbonate  (RENVELA) 2.4 G PACK Take 2.4 g by mouth 3 (three) times daily with meals.    [provider]  simethicone (MYLICON) 80 MG chewable tablet Chew by mouth. 03/12/23   [provider]  sucralfate (CARAFATE) 1 GM/10ML suspension Take 1 g by mouth 4 (four) times daily. 04/29/23 06/28/23  [provider]  tiZANidine (ZANAFLEX) 4 MG tablet Take 4 mg by mouth every 8 (eight) hours as needed for muscle spasms.  01/11/18   [provider]  traMADol (ULTRAM) 50 MG tablet Take 1 tablet (50 mg total) by mouth every 12 (twelve) hours as needed. 12/02/22   [name], MD  traMADol (ULTRAM) 50 MG tablet Take 1 tablet (50 mg total) by mouth every 8 (eight) hours as needed for up to 12 doses for severe pain. 05/08/23   Terald Sleeper, MD  VELPHORO 500 MG chewable tablet Chew 500 mg by mouth 3 (three) times daily with meals. & twice daily with snacks 08/18/21   [provider]      Allergies    Dilaudid [hydromorphone hcl], Amlodipine, Ascorbate, and Midodrine    Review of Systems   Review of Systems  Cardiovascular:  Positive for chest pain.  Gastrointestinal:  Positive for abdominal pain, nausea and vomiting.  All other systems reviewed and are negative.   Physical Exam Updated Vital Signs BP 118/81 (BP Location: Right Arm)   Pulse 80 Comment: Simultaneous filing. User may not have seen previous data.  Temp 98 F (36.7 C) (Oral)   Resp 17 Comment: Simultaneous filing. User may not have seen previous data.  Wt 102.5 kg   SpO2 100% Comment: Simultaneous filing. User may not have seen previous data.  BMI 37.61 kg/m  Physical Exam Vitals and nursing note reviewed.  Constitutional:      General: He is not in acute distress.    Appearance: He is well-developed. He is not ill-appearing, toxic-appearing or diaphoretic.  HENT:     Head: Normocephalic and atraumatic.  Eyes:     Conjunctiva/sclera: Conjunctivae normal.  Cardiovascular:     Rate and Rhythm:  Normal rate and regular rhythm.  Pulmonary:     Effort: Pulmonary effort is normal. No tachypnea or respiratory distress.  Chest:     Chest wall: Tenderness present.  Abdominal:     Palpations: Abdomen is soft.     Tenderness: There is abdominal tenderness.  Musculoskeletal:        General: No swelling. Normal range of motion.     Cervical back: Normal range of motion and neck supple.  Skin:    General: Skin is warm and dry.     Coloration: Skin is not cyanotic or pale.  Neurological:     General: No focal deficit present.     Mental Status: He is alert and oriented to person, place, and time.  Psychiatric:        Mood and Affect: Mood normal.  Behavior: Behavior normal.     ED Results / Procedures / Treatments   Labs (all labs ordered are listed, but only abnormal results are displayed) Labs Reviewed  BASIC METABOLIC PANEL - Abnormal; Notable for the following components:      Result Value   Chloride 97 (*)    Glucose, Bld 67 (*)    Creatinine, Ser 5.11 (*)    GFR, Estimated 14 (*)    Anion gap 16 (*)    All other components within normal limits  CBC - Abnormal; Notable for the following components:   RBC 3.84 (*)    Hemoglobin 11.5 (*)    HCT 35.9 (*)    RDW 16.7 (*)    All other components within normal limits  HEPATIC FUNCTION PANEL - Abnormal; Notable for the following components:   Total Protein 6.3 (*)    Albumin 3.2 (*)    AST 11 (*)    Bilirubin, Direct 0.3 (*)    All other components within normal limits  LIPASE, BLOOD  MAGNESIUM  TROPONIN I (HIGH SENSITIVITY)  TROPONIN I (HIGH SENSITIVITY)    EKG None  Radiology CT CHEST ABDOMEN PELVIS WO CONTRAST  Result Date: 06/28/2023 CLINICAL DATA:  Chest pain with nausea vomiting since last night. Unintentional weight loss. Gastric bypass surgery in August. EXAM: CT CHEST, ABDOMEN AND PELVIS WITHOUT CONTRAST TECHNIQUE: Multidetector CT imaging of the chest, abdomen and pelvis was performed following  the standard protocol without IV contrast. RADIATION DOSE REDUCTION: This exam was performed according to the departmental dose-optimization program which includes automated exposure control, adjustment of the mA and/or kV according to patient size and/or use of iterative reconstruction technique. COMPARISON:  Concurrent chest radiographs. Abdominopelvic CT 04/14/2023 and 04/03/2023. FINDINGS: CT CHEST FINDINGS Cardiovascular: No significant vascular findings are identified on noncontrast imaging. There are calcifications of the aortic valve. The heart size is normal. There is no pericardial effusion. Mediastinum/Nodes: There are no enlarged mediastinal, hilar or axillary lymph nodes. Surgical clips at the thoracic inlet with evidence of previous right thyroid lobectomy. Small hiatal hernia. Lungs/Pleura: No pleural effusion or pneumothorax. The lungs are clear. Musculoskeletal/Chest wall: No chest wall mass or suspicious osseous findings. CT ABDOMEN AND PELVIS FINDINGS Hepatobiliary: Decreased hepatic density consistent with steatosis. No focal lesions identified on noncontrast imaging. No evidence of biliary dilatation status post cholecystectomy. Pancreas: Unremarkable. No pancreatic ductal dilatation or surrounding inflammatory changes. Spleen: Normal in size without focal abnormality. Adrenals/Urinary Tract: Both adrenal glands appear normal. The kidneys appear stable with numerous cysts, scattered parenchymal calcifications and diffuse cortical thinning. There is no hydronephrosis, perinephric soft tissue stranding or suspicious cortical lesion. The bladder is empty. Stomach/Bowel: No enteric contrast administered. Stable postsurgical changes from previous gastric bypass procedure. There is no bowel distension, wall thickening or surrounding inflammation. The appendix appears normal. Stable chronic intramural fat deposition within the colon. Vascular/Lymphatic: Mildly enlarged central mesenteric lymph nodes  are similar to the most recent study, measuring up to 1.4 x 1.2 cm on image 69/2. These have enlarged compared with older CTs. No retroperitoneal or pelvic lymphadenopathy. No significant vascular findings on noncontrast imaging. Reproductive: The prostate gland and seminal vesicles appear unremarkable. Other: Stable postsurgical changes in the anterior abdominal wall. No significant hernia, ascites or pneumoperitoneum. Musculoskeletal: No acute or significant osseous findings. Stable chronic sclerosis along the sacroiliac joints. Unless specific follow-up recommendations are mentioned in the findings or impression sections, no imaging follow-up of any mentioned incidental findings is recommended. IMPRESSION: 1. No acute  findings or explanation for the patient's symptoms in the chest, abdomen or pelvis. 2. Stable postsurgical changes from previous gastric bypass procedure. No evidence of bowel obstruction or inflammation. 3. Stable mildly enlarged central mesenteric lymph nodes, nonspecific although likely reactive. 4. Hepatic steatosis. 5. Stable chronic renal cortical thinning and cysts. 6.  Aortic Atherosclerosis (ICD10-I70.0). Electronically Signed   By: Carey Bullocks M.D.   On: 06/28/2023 13:47   DG Chest 2 View  Result Date: 06/28/2023 CLINICAL DATA:  Chest pain with nausea and vomiting since last night. History of gastric bypass. EXAM: CHEST - 2 VIEW COMPARISON:  Radiographs 06/09/2023 and 05/30/2023. FINDINGS: The heart size and mediastinal contours are normal. The lungs are clear. There is no pleural effusion or pneumothorax. No acute osseous findings are identified. Telemetry leads overlie the chest. Stable surgical clips at the thoracic inlet. IMPRESSION: No evidence of acute cardiopulmonary process. Electronically Signed   By: Carey Bullocks M.D.   On: 06/28/2023 13:32    Procedures Procedures    Medications Ordered in ED Medications  fentaNYL (SUBLIMAZE) injection 50 mcg (50 mcg  Intravenous Given 06/28/23 1148)  metoCLOPramide (REGLAN) injection 10 mg (10 mg Intravenous Given 06/28/23 1146)  famotidine (PEPCID) IVPB 20 mg premix (0 mg Intravenous Stopped 06/28/23 1253)  alum & mag hydroxide-simeth (MAALOX/MYLANTA) 200-200-20 MG/5ML suspension 30 mL (30 mLs Oral Given 06/28/23 1448)    And  lidocaine (XYLOCAINE) 2 % viscous mouth solution 15 mL (15 mLs Oral Given 06/28/23 1448)  oxyCODONE-acetaminophen (PERCOCET/ROXICET) 5-325 MG per tablet 1 tablet (1 tablet Oral Given 06/28/23 1512)    ED Course/ Medical Decision Making/ A&P                                 Medical Decision Making Amount and/or Complexity of Data Reviewed Labs: ordered. Radiology: ordered.  Risk OTC drugs. Prescription drug management.   This patient presents to the ED for concern of epigastric pain, nausea, vomiting, this involves an extensive number of treatment options, and is a complaint that carries with it a high risk of complications and morbidity.  The differential diagnosis includes symptomatic ulcer, gastroparesis, GERD, ACS   Co morbidities that complicate the patient evaluation  arthritis, GERD, HTN, ESRD, gastric bypass surgery, marginal ulcer   Additional history obtained:  Additional history obtained from N/A External records from outside source obtained and reviewed including EMR   Lab Tests:  I Ordered, and personally interpreted labs.  The pertinent results include: Anemia improved from baseline, no leukocytosis, elevated creatinine consistent with ESRD, normal electrolytes, normal lipase, normal hepatobiliary enzymes, normal troponins x 2   Imaging Studies ordered:  I ordered imaging studies including chest x-ray, CT of chest, abdomen, pelvis I independently visualized and interpreted imaging which showed no acute findings I agree with the radiologist interpretation   Cardiac Monitoring: / EKG:  The patient was maintained on a cardiac monitor.  I  personally viewed and interpreted the cardiac monitored which showed an underlying rhythm of: Sinus rhythm   Problem List / ED Course / Critical interventions / Medication management  Patient presenting for lower chest/epigastric pain, nausea, vomiting since yesterday.  He has known marginal ulcers following bypass surgery this summer and identified on recent endoscopy.  He has been on a liquid diet.  He has had epigastric discomfort before in the past several months but this time it is more severe.  He has had nausea, vomiting, and p.o.  intolerance today.  On arrival in the ED, his vital signs are normal.  On exam, patient appears uncomfortable.  He rates current pain at 9/10 in severity.  Fentanyl was ordered for analgesia.  Reglan was ordered for nausea.  He does have some epigastric tenderness.  Current breathing is unlabored. Workup was initiated.  Lab work was reassuring.  Lipase, hepatobiliary enzymes, troponin were all normal.  No leukocytosis is present.  Patient underwent CT imaging of chest, abdomen, pelvis.  There were no acute findings identified.  There were stable postsurgical changes without new evidence of inflammation.  Patient had improved symptoms while in the ED.  Suspected etiologies of his recent symptoms include symptoms from his ulcer and/for gastroparesis.  He was previously on Ozempic but no longer takes this.  He is prescribed Reglan already.  He is also on PPIs.  He was given GI cocktail while in the ED.  He was able to tolerate p.o. intake.  He is currently on daily Nexium.  He has Reglan at home to take as needed.  Patient to follow-up with his GI specialists.  He was discharged in stable condition. I ordered medication including fentanyl and Percocet for analgesia; Reglan for nausea; GI cocktail for empiric treatment of symptomatic ulcer Reevaluation of the patient after these medicines showed that the patient improved I have reviewed the patients home medicines and have made  adjustments as needed   Social Determinants of Health:  Has access to outpatient care         Final Clinical Impression(s) / ED Diagnoses Final diagnoses:  Epigastric pain  Nausea and vomiting, unspecified vomiting type    Rx / DC Orders ED Discharge Orders     None         Gloris Manchester, MD 06/28/23 1516    Gloris Manchester, MD 06/28/23 1517

## 2023-06-28 NOTE — ED Notes (Addendum)
Discharge paperwork given and verbally understood.... Pt aware of no driving/drinking due to the meds that were given.Marland KitchenMarland Kitchen

## 2023-07-07 ENCOUNTER — Emergency Department (HOSPITAL_COMMUNITY)
Admission: EM | Admit: 2023-07-07 | Discharge: 2023-07-07 | Disposition: A | Discharge status: Other Acute Inpatient Hospital | Payer: 59 | Attending: Emergency Medicine | Admitting: Emergency Medicine

## 2023-07-07 ENCOUNTER — Encounter (HOSPITAL_COMMUNITY): Payer: Self-pay

## 2023-07-07 ENCOUNTER — Emergency Department (HOSPITAL_COMMUNITY): Payer: 59

## 2023-07-07 ENCOUNTER — Other Ambulatory Visit: Payer: Self-pay

## 2023-07-07 DIAGNOSIS — K255 Chronic or unspecified gastric ulcer with perforation: Secondary | ICD-10-CM | POA: Insufficient documentation

## 2023-07-07 DIAGNOSIS — R1011 Right upper quadrant pain: Secondary | ICD-10-CM | POA: Diagnosis present

## 2023-07-07 DIAGNOSIS — Z992 Dependence on renal dialysis: Secondary | ICD-10-CM | POA: Insufficient documentation

## 2023-07-07 DIAGNOSIS — N186 End stage renal disease: Secondary | ICD-10-CM | POA: Insufficient documentation

## 2023-07-07 LAB — I-STAT CG4 LACTIC ACID, ED: Lactic Acid, Venous: 1.5 mmol/L (ref 0.5–1.9)

## 2023-07-07 LAB — CBC
HCT: 39.6 % (ref 39.0–52.0)
Hemoglobin: 12.9 g/dL — ABNORMAL LOW (ref 13.0–17.0)
MCH: 30.6 pg (ref 26.0–34.0)
MCHC: 32.6 g/dL (ref 30.0–36.0)
MCV: 93.8 fL (ref 80.0–100.0)
Platelets: 245 10*3/uL (ref 150–400)
RBC: 4.22 MIL/uL (ref 4.22–5.81)
RDW: 16.2 % — ABNORMAL HIGH (ref 11.5–15.5)
WBC: 10.1 10*3/uL (ref 4.0–10.5)
nRBC: 0 % (ref 0.0–0.2)

## 2023-07-07 LAB — COMPREHENSIVE METABOLIC PANEL
ALT: 12 U/L (ref 0–44)
AST: 17 U/L (ref 15–41)
Albumin: 2.7 g/dL — ABNORMAL LOW (ref 3.5–5.0)
Alkaline Phosphatase: 72 U/L (ref 38–126)
Anion gap: 14 (ref 5–15)
BUN: 9 mg/dL (ref 6–20)
CO2: 22 mmol/L (ref 22–32)
Calcium: 9.4 mg/dL (ref 8.9–10.3)
Chloride: 101 mmol/L (ref 98–111)
Creatinine, Ser: 7.48 mg/dL — ABNORMAL HIGH (ref 0.61–1.24)
GFR, Estimated: 9 mL/min — ABNORMAL LOW (ref >60)
Glucose, Bld: 99 mg/dL (ref 70–99)
Potassium: 4.8 mmol/L (ref 3.5–5.1)
Sodium: 137 mmol/L (ref 135–145)
Total Bilirubin: 1.2 mg/dL — ABNORMAL HIGH (ref <1.2)
Total Protein: 6.5 g/dL (ref 6.5–8.1)

## 2023-07-07 LAB — LIPASE, BLOOD: Lipase: 24 U/L (ref 11–51)

## 2023-07-07 LAB — I-STAT CHEM 8, ED
BUN: 11 mg/dL (ref 6–20)
Calcium, Ion: 0.85 mmol/L — CL (ref 1.15–1.40)
Chloride: 105 mmol/L (ref 98–111)
Creatinine, Ser: 8.1 mg/dL — ABNORMAL HIGH (ref 0.61–1.24)
Glucose, Bld: 93 mg/dL (ref 70–99)
HCT: 44 % (ref 39.0–52.0)
Hemoglobin: 15 g/dL (ref 13.0–17.0)
Potassium: 5.1 mmol/L (ref 3.5–5.1)
Sodium: 134 mmol/L — ABNORMAL LOW (ref 135–145)
TCO2: 28 mmol/L (ref 22–32)

## 2023-07-07 LAB — TROPONIN I (HIGH SENSITIVITY)
Troponin I (High Sensitivity): 5 ng/L (ref <18)
Troponin I (High Sensitivity): 6 ng/L (ref <18)

## 2023-07-07 MED ORDER — FAMOTIDINE IN NACL 20-0.9 MG/50ML-% IV SOLN
20.0000 mg | Freq: Once | INTRAVENOUS | Status: AC
  Administered 2023-07-07: 20 mg via INTRAVENOUS
  Filled 2023-07-07: qty 50

## 2023-07-07 MED ORDER — IOHEXOL 350 MG/ML SOLN
75.0000 mL | Freq: Once | INTRAVENOUS | Status: AC | PRN
  Administered 2023-07-07: 75 mL via INTRAVENOUS

## 2023-07-07 MED ORDER — PANTOPRAZOLE SODIUM 40 MG IV SOLR
40.0000 mg | Freq: Once | INTRAVENOUS | Status: AC
  Administered 2023-07-07: 40 mg via INTRAVENOUS
  Filled 2023-07-07: qty 10

## 2023-07-07 MED ORDER — FENTANYL CITRATE PF 50 MCG/ML IJ SOSY
50.0000 ug | PREFILLED_SYRINGE | Freq: Once | INTRAMUSCULAR | Status: AC
  Administered 2023-07-07: 50 ug via INTRAVENOUS
  Filled 2023-07-07: qty 1

## 2023-07-07 MED ORDER — SODIUM CHLORIDE 0.9 % IV BOLUS
250.0000 mL | Freq: Once | INTRAVENOUS | Status: AC
  Administered 2023-07-07: 250 mL via INTRAVENOUS

## 2023-07-07 MED ORDER — PIPERACILLIN-TAZOBACTAM 3.375 G IVPB 30 MIN
3.3750 g | Freq: Once | INTRAVENOUS | Status: AC
  Administered 2023-07-07: 3.375 g via INTRAVENOUS
  Filled 2023-07-07: qty 50

## 2023-07-07 MED ORDER — METOCLOPRAMIDE HCL 5 MG/ML IJ SOLN
10.0000 mg | Freq: Once | INTRAMUSCULAR | Status: AC
  Administered 2023-07-07: 10 mg via INTRAVENOUS
  Filled 2023-07-07: qty 2

## 2023-07-07 MED ORDER — FENTANYL CITRATE PF 50 MCG/ML IJ SOSY
50.0000 ug | PREFILLED_SYRINGE | INTRAMUSCULAR | Status: AC | PRN
  Administered 2023-07-07 (×3): 50 ug via INTRAVENOUS
  Filled 2023-07-07 (×2): qty 1

## 2023-07-07 NOTE — ED Triage Notes (Signed)
Patient reports chest pain that started this morning. States he could not sit comfortably. Patient is a dialysis patient (M,W,F). Rates chest and abdominal pain 10/10. No blood thinners. States the chest pain starts on the left side and radiate to the shoulder.

## 2023-07-07 NOTE — H&P (Signed)
[name] 1986-07-03  [id number].    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 Describes acute onset of right sided abdominal pain that started this morning around 0600. Pain described as sharp and severe with radiation to his chest. He has been unable to take any medications this morning due to nausea. Associated sxs include SOB and nausea/vomiting. He denies diarrhea or constipation. Last BM was yesterday and described as normal, non-bloody. He denies melena. Denies use of NSAIDs. He has been compliant with PPI and carafate. Last seen at [facility] for EGD 06/22/23 where a non-bleeding cratered ulcer on the jejunal side was seen; the ulcer within the pouch itself had healed from prior EGD; stenosis at the gastrojejunal anastomosis was dilated with scope passage.  He reports over 100lb weight loss since surgery 4 months ago.  Past abdominal surgeries:  lap gastric band 2016 Dr. Ezzard Standing, lap chole 2018 Dr. 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 Felton Clinton Blood thinners:  none  Substance use:  states he is a former smoker (quit 2-3 months ago), denies alcohol or drug use.  Employment:  not currently working  ROS: Review of Systems  All other systems reviewed and are negative.   Family History  Problem Relation Age of Onset   Hypertension Mother    Diabetes Father    Kidney Stones Sister    Diabetes Maternal Grandmother    Liver cancer Maternal Uncle    Colon cancer Neg Hx    Colon polyps Neg Hx    Esophageal cancer Neg Hx    Rectal cancer Neg Hx    Stomach cancer Neg Hx     Past Medical History:  Diagnosis Date   Arthritis    "all over" (11/19/2016)   Chronic lower back pain    Complication of anesthesia    A little while to wake up after knee surgery in 2008    Dizziness    when coming off of dialysis   ESRD (end stage renal disease) on dialysis [facility])    MWF and goes to [facility] (11/19/2016)   Family history of anesthesia complication    mom slow to wake up   GERD (gastroesophageal reflux disease)    takes Omeprazole as needed   Headache(784.0)    "monthly" (11/19/2016)   History of hiatal hernia    Hyperparathyroidism (HCC)    Hypertension    Joint pain    Joint swelling    Morbid obesity (HCC)    PONV (postoperative nausea and vomiting)    Thyroid disease     Past Surgical History:  Procedure Laterality Date   AV FISTULA PLACEMENT Bilateral 2005,2007   "right; left"   AV FISTULA REPAIR Right 2007   "tried to clean it out; couldn't do it"   BOTOX INJECTION N/A 05/25/2019   Procedure: BOTOX INJECTION;  Surgeon: Sherrilyn Rist, MD;  Location: [facility] ENDOSCOPY;  Service: Gastroenterology;  Laterality: N/A;   BREATH TEK H PYLORI N/A 05/15/2014   Procedure: BREATH TEK H PYLORI;  Surgeon: Ovidio Kin, MD;  Location: [facility] ENDOSCOPY;  Service: General;  Laterality: N/A;   CHOLECYSTECTOMY N/A 11/23/2016   Procedure: LAPAROSCOPIC CHOLECYSTECTOMY WITH INTRAOPERATIVE CHOLANGIOGRAM;  Surgeon: Harriette Bouillon, MD;  Location: [facility] OR;  Service: General;  Laterality: N/A;   ESOPHAGOGASTRODUODENOSCOPY N/A  12/07/2014   Procedure: ESOPHAGOGASTRODUODENOSCOPY (EGD);  Surgeon: Ovidio Kin, MD;  Location: [facility] ENDOSCOPY;  Service: General;  Laterality: N/A;   ESOPHAGOGASTRODUODENOSCOPY N/A 12/17/2014   Procedure: ESOPHAGOGASTRODUODENOSCOPY (EGD);  Surgeon: Ovidio Kin, MD;  Location: [facility] OR;  Service: General;  Laterality: N/A;   ESOPHAGOGASTRODUODENOSCOPY N/A 08/16/2016   Procedure: ESOPHAGOGASTRODUODENOSCOPY (EGD);  Surgeon: Beverley Fiedler, MD;  Location: [facility] ENDOSCOPY;  Service: Gastroenterology;  Laterality: N/A;   ESOPHAGOGASTRODUODENOSCOPY (EGD) WITH PROPOFOL N/A 07/05/2017   Procedure: ESOPHAGOGASTRODUODENOSCOPY (EGD) WITH PROPOFOL;  Surgeon: Rachael Fee,  MD;  Location: [facility] ENDOSCOPY;  Service: Endoscopy;  Laterality: N/A;   ESOPHAGOGASTRODUODENOSCOPY (EGD) WITH PROPOFOL N/A 05/25/2019   Procedure: ESOPHAGOGASTRODUODENOSCOPY (EGD) WITH PROPOFOL;  Surgeon: Sherrilyn Rist, MD;  Location: [facility] ENDOSCOPY;  Service: Gastroenterology;  Laterality: N/A;   HERNIA REPAIR  11/2014   w/lap band OR   JOINT REPLACEMENT     KNEE ARTHROSCOPY Left 2007   LAPAROSCOPIC GASTRIC BANDING N/A 11/12/2014   Procedure: LAPAROSCOPIC GASTRIC BANDING;  Surgeon: Ovidio Kin, MD;  Location: [facility] ORS;  Service: General;  Laterality: N/A;   PARATHYROIDECTOMY N/A 03/30/2013   Procedure: TOTAL PARATHYROIDECTOMY WITH AUTOTRANSPLANT;  Surgeon: Velora Heckler, MD;  Location: [facility] OR;  Service: General;  Laterality: N/A;  Autotransplant to left lower arm.   TOTAL KNEE ARTHROPLASTY Right 03/27/2014   Procedure: UNICOMPARTMENTAL ARTHROPLASTY;  Surgeon: [name], MD;  Location: [facility] OR;  Service: Orthopedics;  Laterality: Right;    Social History:  reports that he quit smoking about 4 years ago. His smoking use included cigarettes. He started smoking about 20 years ago. He has a 1.6 pack-year smoking history. He has never used smokeless tobacco. He reports that he does not drink alcohol and does not use drugs.  Allergies:  Allergies  Allergen Reactions   Dilaudid [Hydromorphone Hcl] Other (See Comments)    Patient became hypoxic to 60% with dilaudid 1mg  IV   Amlodipine Other (See Comments)   Ascorbate Itching   Midodrine Itching    (Not in a hospital admission)    Physical Exam: Blood pressure (!) 104/59, pulse 88, temperature 97.9 F (36.6 C), temperature source Oral, resp. rate (!) 30, SpO2 99%. General: black male, laying on hospital bed, appears stated age, appears uncomfortable HEENT: head -normocephalic, atraumatic; Eyes: Pupils equal and round, no conjunctival injection;anicteric sclerae Neck- Trachea is midline, no thyromegaly or JVD appreciated.  CV- RRR,  normal S1/S2, no M/R/G, no lower extremity edema  Pulm- breathing is slightly labored, tachypnea present. CTABL, no wheezes, rhales, rhonchi. Abd- well healed laparoscopic incisions, abdomen is soft, mild distension, moderate right sided TTP with guarding, no diffuse peritonitis GU- deferred  MSK- UE/LE symmetrical, no cyanosis, clubbing, or edema. Neuro- MAEs, no gross motor or sensory deficits BUE/BLE Psych- Alert and Oriented x3 with appropriate affect Skin: warm and dry, no rashes or lesions   Results for orders placed or performed during the hospital encounter of 07/07/23 (from the past 48 hour(s))  CBC     Status: Abnormal   Collection Time: 07/07/23  7:14 AM  Result Value Ref Range   WBC 10.1 4.0 - 10.5 K/uL   RBC 4.22 4.22 - 5.81 MIL/uL   Hemoglobin 12.9 (L) 13.0 - 17.0 g/dL   HCT 29.5 62.1 - 30.8 %   MCV 93.8 80.0 - 100.0 fL   MCH 30.6 26.0 - 34.0 pg   MCHC 32.6 30.0 - 36.0 g/dL   RDW 65.7 (H) 84.6 - 96.2 %   Platelets 245  150 - 400 K/uL   nRBC 0.0 0.0 - 0.2 %    Comment: Performed at [facility], [address] [place], [place] [address]  Comprehensive metabolic panel     Status: Abnormal   Collection Time: 07/07/23  7:14 AM  Result Value Ref Range   Sodium 137 135 - 145 mmol/L   Potassium 4.8 3.5 - 5.1 mmol/L   Chloride 101 98 - 111 mmol/L   CO2 22 22 - 32 mmol/L   Glucose, Bld 99 70 - 99 mg/dL    Comment: Glucose reference range applies only to samples taken after fasting for at least 8 hours.   BUN 9 6 - 20 mg/dL   Creatinine, Ser 0.86 (H) 0.61 - 1.24 mg/dL   Calcium 9.4 8.9 - 57.8 mg/dL   Total Protein 6.5 6.5 - 8.1 g/dL   Albumin 2.7 (L) 3.5 - 5.0 g/dL   AST 17 15 - 41 U/L   ALT 12 0 - 44 U/L   Alkaline Phosphatase 72 38 - 126 U/L   Total Bilirubin 1.2 (H) <1.2 mg/dL   GFR, Estimated 9 (L) >60 mL/min    Comment: (NOTE) Calculated using the CKD-EPI Creatinine Equation (2021)    Anion gap 14 5 - 15    Comment: Performed at [facility],  [address], [place], [place] [address]  Lipase, blood     Status: None   Collection Time: 07/07/23  7:14 AM  Result Value Ref Range   Lipase 24 11 - 51 U/L    Comment: Performed at [facility], [address] [place], [place] [address]  Troponin I (High Sensitivity)     Status: None   Collection Time: 07/07/23  7:14 AM  Result Value Ref Range   Troponin I (High Sensitivity) 5 <18 ng/L    Comment: (NOTE) Elevated high sensitivity troponin I (hsTnI) values and significant  changes across serial measurements may suggest ACS but many other  chronic and acute conditions are known to elevate hsTnI results.  Refer to the "Links" section for chest pain algorithms and additional  guidance. Performed at [facility], [address], [place], [place] [address]   I-stat chem 8, ED (not at [facility], [facility] or [facility])     Status: Abnormal   Collection Time: 07/07/23  7:29 AM  Result Value Ref Range   Sodium 134 (L) 135 - 145 mmol/L   Potassium 5.1 3.5 - 5.1 mmol/L   Chloride 105 98 - 111 mmol/L   BUN 11 6 - 20 mg/dL   Creatinine, Ser 0.10 (H) 0.61 - 1.24 mg/dL   Glucose, Bld 93 70 - 99 mg/dL    Comment: Glucose reference range applies only to samples taken after fasting for at least 8 hours.   Calcium, Ion 0.85 (LL) 1.15 - 1.40 mmol/L   TCO2 28 22 - 32 mmol/L   Hemoglobin 15.0 13.0 - 17.0 g/dL   HCT 27.2 53.6 - 64.4 %   Comment NOTIFIED PHYSICIAN   I-Stat Lactic Acid     Status: None   Collection Time: 07/07/23  8:53 AM  Result Value Ref Range   Lactic Acid, Venous 1.5 0.5 - 1.9 mmol/L   CT ABDOMEN PELVIS W CONTRAST  Result Date: 07/07/2023 CLINICAL DATA:  Chest pain beginning this morning. Also with abdominal pain. Patient underwent gastric bypass surgery approximally 4 months ago. EXAM: CT ABDOMEN AND PELVIS WITH CONTRAST TECHNIQUE: Multidetector CT imaging of the abdomen  and pelvis was performed using the standard protocol following bolus administration of intravenous contrast.  RADIATION DOSE REDUCTION: This exam was performed according to the departmental dose-optimization program which includes automated exposure control, adjustment of the mA and/or kV according to patient size and/or use of iterative reconstruction technique. CONTRAST:  75mL OMNIPAQUE IOHEXOL 350 MG/ML SOLN COMPARISON:  06/28/2023. FINDINGS: Lower chest: No acute abnormality. Hepatobiliary: No focal liver abnormality is seen. Status post cholecystectomy. No biliary dilatation. Pancreas: Unremarkable. No pancreatic ductal dilatation or surrounding inflammatory changes. Spleen: Normal in size without focal abnormality. Adrenals/Urinary Tract: Normal adrenal glands. Both kidneys show significant cortical thinning and numerous cysts as well as parenchymal calcifications, stable from the prior exam. Largest cyst is exophytic from the posterolateral midpole the left kidney, 3.2 cm. No specific follow-up recommended. No suspicious renal mass. No hydronephrosis. Normal ureters. Bladder decompressed. Stomach/Bowel: There is a small amount of free intraperitoneal air collecting anteriorly in the right mid to upper abdomen. Hazy type inflammatory change with small bubbles of air is seen in the right upper quadrant adjacent to the anterior margin of the distal stomach. In this location, stomach wall appears ill-defined, which could reflect an ulceration and the source of the free air. There is also a focal area non dependent air and dependent fluid adjacent to the gastrojejunostomy anastomosis in the central upper abdomen, likely an outpouching, but possibly a extraluminal collection. This was present but smaller on the prior CT. There is significant thickening of the anterior wall the stomach estimated 1.8 cm. Small bowel and colon are normal in caliber. No wall thickening. No other areas of inflammation. Normal appendix visualized. Vascular/Lymphatic: No significant vascular findings are present. No enlarged abdominal or pelvic  lymph nodes. Reproductive: Unremarkable. Other: Trace ascites collects in the posterior pelvic recess. Musculoskeletal: No fracture or acute finding.  No bone lesion. IMPRESSION: 1. Positive for bowel perforation, suspected origin from the stomach. Small amount of free intraperitoneal air collecting anteriorly in the right mid to upper abdomen. This appears to be related to a perforated ulcer along the anterior margin of the stomach, either at/adjacent to the gastrojejunostomy site or along the distal antrum. Critical Value/emergent results were discussed by telephone at the time of interpretation on 07/07/2023 at 8:42 am to provider Pricilla Loveless , who verbally acknowledged these results. 2. Significant thickening of the anterior wall the stomach estimated 1.8 cm. This is suspected to be underlying inflammation/gastritis. 3. No other evidence of an acute abnormality. 4. 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 Electronically Signed   By: Amie [place] M.D.   On: 07/07/2023 08:42   DG CHEST PORT 1 VIEW  Result Date: 07/07/2023 CLINICAL DATA:  Shortness of breath EXAM: PORTABLE CHEST 1 VIEW COMPARISON:  06/28/2023 FINDINGS: Low volume chest. There is no edema, consolidation, effusion, or pneumothorax. Normal heart size and mediastinal contours. Clips at the thoracic inlet from thyroidectomy. Artifact from EKG leads. IMPRESSION: Negative low volume chest. Electronically Signed   By: Tiburcio Pea M.D.   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 He is currently hemodynamically stable, baseline BP around 90 systolic. Discussed with MD who will review imaging and examine patient to determine the next steps.  FEN - NPO, IVF, PPI VTE - SCD's ID -  Zosyn   I  reviewed ED provider notes, last 24 h vitals and pain scores, last 24 h labs and trends, and last 24 h imaging results.  Carlena Bjornstad, [facility] 07/07/2023, 9:02 AM Please see Amion for pager number during day hours 7:00am-4:30pm or 7:00am -11:30am on weekends

## 2023-07-07 NOTE — ED Provider Notes (Signed)
[place] EMERGENCY DEPARTMENT AT [facility] Provider Note   CSN: [id number] Arrival date & time: 07/07/23  3875     History  Chief Complaint  Patient presents with   Abdominal Pain   Chest Pain    [name] is a 37 y.o. male.  HPI 37 year old male presents with abdominal and chest pain.  Symptoms started around 6 AM this morning and he came here.  He states this is different than when he was here a week or so ago.  Both the abdominal and chest pain are both sharp.  He feels short of breath and vomited once.  There was no blood in his emesis.  Last had a bowel movement yesterday which did not have melena or blood.  He denies any fevers.  He is also having some right shoulder pain.  Did not take any of his morning medicines.  Tells me that his blood pressure typically runs low, around 90 systolic.  Last went to dialysis on Monday and is due today but came here instead.  In addition to ESRD on dialysis, patient also has a more recent history of a gastric bypass a few months ago and has been dealing with gastric ulcers.  He is due for an endoscopy in 6 days.  Home Medications Prior to Admission medications   Medication Sig Start Date End Date Taking? Authorizing Provider  albuterol (PROVENTIL HFA;VENTOLIN HFA) 108 (90 Base) MCG/ACT inhaler Inhale 2 puffs into the lungs every 4 (four) hours as needed for wheezing or shortness of breath. 05/14/16  Yes [name], [name]  b complex-vitamin c-folic acid (NEPHRO-VITE) 0.8 MG TABS tablet Take 1 tablet by mouth daily.  08/16/16  Yes [provider]  diclofenac Sodium (VOLTAREN) 1 % GEL Apply 4 g topically 4 (four) times daily as needed (Pain). 03/13/23  Yes [provider]  famotidine (PEPCID) 20 MG tablet Take 1 tablet (20 mg total) by mouth daily. 04/27/19  Yes [name], [name]  gabapentin (NEURONTIN) 250 MG/5ML solution Take 250 mg by mouth 3 (three) times daily. 03/12/23  Yes [provider]   meloxicam (MOBIC) 15 MG tablet Take 0.5-1 tablets (7.5-15 mg total) by mouth daily as needed for pain. 08/01/19  Yes [name], [name], MD  methocarbamol (ROBAXIN) 500 MG tablet Take 500 mg by mouth 2 (two) times daily. 03/13/23  Yes [provider]  metoCLOPramide (REGLAN) 10 MG tablet Take 0.5 tablets (5 mg total) by mouth every 12 (twelve) hours as needed for up to 10 doses for refractory nausea / vomiting. 05/08/23  Yes Terald Sleeper, MD  NEXIUM 40 MG capsule Take 40 mg by mouth daily. 03/12/23  Yes [provider]  omeprazole (PRILOSEC) 20 MG capsule Take 1 capsule by mouth daily. 03/21/23  Yes [provider]  ondansetron (ZOFRAN-ODT) 4 MG disintegrating tablet Take 1 tablet (4 mg total) by mouth every 8 (eight) hours as needed for nausea or vomiting. 04/03/23  Yes [name], [name], MD  pantoprazole (PROTONIX) 40 MG tablet Take 1 tablet (40 mg total) by mouth daily. Take 30 minutes before breakfast. Patient taking differently: Take 40 mg by mouth daily as needed (Indigestion). 05/08/19  Yes Unk [name], PA  sucralfate (CARAFATE) 1 GM/10ML suspension Take 1 g by mouth 4 (four) times daily.   Yes [provider]  traMADol (ULTRAM) 50 MG tablet Take 1 tablet (50 mg total) by mouth every 12 (twelve) hours as needed. 12/02/22  Yes Rise Paganini  Lorin Picket, MD  oxyCODONE (ROXICODONE) 5 MG/5ML solution Take by mouth. Patient not taking: Reported on 07/07/2023 03/13/23   [provider]  sevelamer carbonate (RENVELA) 2.4 G PACK Take 2.4 g by mouth 3 (three) times daily with meals. Patient not taking: Reported on 07/07/2023    [provider]  VELPHORO 500 MG chewable tablet Chew 500 mg by mouth 3 (three) times daily with meals. & twice daily with snacks Patient not taking: Reported on 07/07/2023 08/18/21   [provider]      Allergies    Dilaudid [hydromorphone hcl], Amlodipine, Ascorbate, and Midodrine    Review of Systems   Review of  Systems  Constitutional:  Negative for fever.  Respiratory:  Positive for shortness of breath. Negative for cough.   Cardiovascular:  Positive for chest pain.  Gastrointestinal:  Positive for abdominal pain and vomiting.    Physical Exam Updated Vital Signs BP 109/74   Pulse (!) 102   Temp 97.9 F (36.6 C) (Oral)   Resp 20   SpO2 99%  Physical Exam Vitals and nursing note reviewed.  Constitutional:      Appearance: He is well-developed. He is not ill-appearing or diaphoretic.     Comments: In pain  HENT:     Head: Normocephalic and atraumatic.  Cardiovascular:     Rate and Rhythm: Normal rate and regular rhythm.     Heart sounds: Normal heart sounds.  Pulmonary:     Effort: Pulmonary effort is normal.     Breath sounds: Normal breath sounds.  Abdominal:     Palpations: Abdomen is soft.     Tenderness: There is abdominal tenderness in the right upper quadrant and right lower quadrant.  Skin:    General: Skin is warm and dry.  Neurological:     Mental Status: He is alert.     ED Results / Procedures / Treatments   Labs (all labs ordered are listed, but only abnormal results are displayed) Labs Reviewed  CBC - Abnormal; Notable for the following components:      Result Value   Hemoglobin 12.9 (*)    RDW 16.2 (*)    All other components within normal limits  COMPREHENSIVE METABOLIC PANEL - Abnormal; Notable for the following components:   Creatinine, Ser 7.48 (*)    Albumin 2.7 (*)    Total Bilirubin 1.2 (*)    GFR, Estimated 9 (*)    All other components within normal limits  I-STAT CHEM 8, ED - Abnormal; Notable for the following components:   Sodium 134 (*)    Creatinine, Ser 8.10 (*)    Calcium, Ion 0.85 (*)    All other components within normal limits  LIPASE, BLOOD  I-STAT CG4 LACTIC ACID, ED  TROPONIN I (HIGH SENSITIVITY)  TROPONIN I (HIGH SENSITIVITY)    EKG None  Radiology CT ABDOMEN PELVIS W CONTRAST  Result Date: 07/07/2023 CLINICAL DATA:   Chest pain beginning this morning. Also with abdominal pain. Patient underwent gastric bypass surgery approximally 4 months ago. EXAM: CT ABDOMEN AND PELVIS WITH CONTRAST TECHNIQUE: Multidetector CT imaging of the abdomen and pelvis was performed using the standard protocol following bolus administration of intravenous contrast. RADIATION DOSE REDUCTION: This exam was performed according to the departmental dose-optimization program which includes automated exposure control, adjustment of the mA and/or kV according to patient size and/or use of iterative reconstruction technique. CONTRAST:  75mL OMNIPAQUE IOHEXOL 350 MG/ML SOLN COMPARISON:  06/28/2023. FINDINGS: Lower chest: No acute abnormality. Hepatobiliary:  No focal liver abnormality is seen. Status post cholecystectomy. No biliary dilatation. Pancreas: Unremarkable. No pancreatic ductal dilatation or surrounding inflammatory changes. Spleen: Normal in size without focal abnormality. Adrenals/Urinary Tract: Normal adrenal glands. Both kidneys show significant cortical thinning and numerous cysts as well as parenchymal calcifications, stable from the prior exam. Largest cyst is exophytic from the posterolateral midpole the left kidney, 3.2 cm. No specific follow-up recommended. No suspicious renal mass. No hydronephrosis. Normal ureters. Bladder decompressed. Stomach/Bowel: There is a small amount of free intraperitoneal air collecting anteriorly in the right mid to upper abdomen. Hazy type inflammatory change with small bubbles of air is seen in the right upper quadrant adjacent to the anterior margin of the distal stomach. In this location, stomach wall appears ill-defined, which could reflect an ulceration and the source of the free air. There is also a focal area non dependent air and dependent fluid adjacent to the gastrojejunostomy anastomosis in the central upper abdomen, likely an outpouching, but possibly a extraluminal collection. This was present but  smaller on the prior CT. There is significant thickening of the anterior wall the stomach estimated 1.8 cm. Small bowel and colon are normal in caliber. No wall thickening. No other areas of inflammation. Normal appendix visualized. Vascular/Lymphatic: No significant vascular findings are present. No enlarged abdominal or pelvic lymph nodes. Reproductive: Unremarkable. Other: Trace ascites collects in the posterior pelvic recess. Musculoskeletal: No fracture or acute finding.  No bone lesion. IMPRESSION: 1. Positive for bowel perforation, suspected origin from the stomach. Small amount of free intraperitoneal air collecting anteriorly in the right mid to upper abdomen. This appears to be related to a perforated ulcer along the anterior margin of the stomach, either at/adjacent to the gastrojejunostomy site or along the distal antrum. Critical Value/emergent results were discussed by telephone at the time of interpretation on 07/07/2023 at 8:42 am to provider Pricilla Loveless , who verbally acknowledged these results. 2. Significant thickening of the anterior wall the stomach estimated 1.8 cm. This is suspected to be underlying inflammation/gastritis. 3. No other evidence of an acute abnormality. 4. 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 Electronically Signed   By: Amie Portland M.D.   On: 07/07/2023 08:42   DG CHEST PORT 1 VIEW  Result Date: 07/07/2023 CLINICAL DATA:  Shortness of breath EXAM: PORTABLE CHEST 1 VIEW COMPARISON:  06/28/2023 FINDINGS: Low volume chest. There is no edema, consolidation, effusion, or pneumothorax. Normal heart size and mediastinal contours. Clips at the thoracic inlet from thyroidectomy. Artifact from EKG leads. IMPRESSION: Negative low volume chest. Electronically Signed   By: Tiburcio Pea M.D.   On: 07/07/2023 07:41    Procedures .Critical Care  Performed by: Pricilla Loveless, MD Authorized by: Pricilla Loveless, MD   Critical  care provider statement:    Critical care time (minutes):  60   Critical care time was exclusive of:  Separately billable procedures and treating other patients   Critical care was necessary to treat or prevent imminent or life-threatening deterioration of the following conditions:  Circulatory failure and shock   Critical care was time spent personally by me on the following activities:  Development of treatment plan with patient or surrogate, discussions with consultants, evaluation of patient's response to treatment, examination of patient, ordering and review of laboratory studies, ordering and review of radiographic studies, ordering and performing treatments and interventions, pulse oximetry, re-evaluation of patient's condition and review of old charts     Medications  Ordered in ED Medications  fentaNYL (SUBLIMAZE) injection 50 mcg (50 mcg Intravenous Given 07/07/23 1034)  metoCLOPramide (REGLAN) injection 10 mg (10 mg Intravenous Given 07/07/23 0738)  fentaNYL (SUBLIMAZE) injection 50 mcg (50 mcg Intravenous Given 07/07/23 0840)  sodium chloride 0.9 % bolus 250 mL (0 mLs Intravenous Stopped 07/07/23 0804)  pantoprazole (PROTONIX) injection 40 mg (40 mg Intravenous Given 07/07/23 0738)  famotidine (PEPCID) IVPB 20 mg premix (0 mg Intravenous Stopped 07/07/23 0837)  iohexol (OMNIPAQUE) 350 MG/ML injection 75 mL (75 mLs Intravenous Contrast Given 07/07/23 0827)  piperacillin-tazobactam (ZOSYN) IVPB 3.375 g (0 g Intravenous Stopped 07/07/23 0952)  pantoprazole (PROTONIX) injection 40 mg (40 mg Intravenous Given 07/07/23 0841)  fentaNYL (SUBLIMAZE) injection 50 mcg (50 mcg Intravenous Given 07/07/23 0849)    ED Course/ Medical Decision Making/ A&P Clinical Course as of 07/07/23 1207  Wed Jul 07, 2023  0959 Dr. Dossie Der has seen patient and given he seems stable is recommending transfer to [facility] given their familiarity with care and a postop complication.  [facility] has been called. [SG]  1051  Discussed with [facility], Dr. Ferd Glassing.  He has viewed the images of the CT scan and accepts in transfer to the [facility] ER.  [facility] is going to arrange for transport.  Agrees with care so far including the antibiotics.  Patient remains stable on reassessment and family has been updated. [SG]    Clinical Course User Index [SG] Pricilla Loveless, MD                                 Medical Decision Making Amount and/or Complexity of Data Reviewed External Data Reviewed: notes. Labs: ordered.    Details: Normal troponin.  Expected CKD without electrolyte disturbance Radiology: ordered and independent interpretation performed.    Details: Free air in the abdomen and likely gastric perforation. ECG/medicine tests: ordered and independent interpretation performed.    Details: No ischemia Discussion of management or test interpretation with external provider(s): Discussed with radiology, Dr. Oleta Mouse  Risk Prescription drug management. Decision regarding hospitalization.   Patient presents with abdominal pain and chest pain and was found to have free peritoneal air, likely from a gastric source.  Has a known history of a recent gastric bypass though not in our system.  Chart reviewed.  Given fentanyl, Protonix and Pepcid.  Ultimately also given Zosyn due to the perforation and general surgery was consulted.  He had some soft pressures at first which came up with some fluids.  Has been kept NPO.  As above, general surgery is recommending transfer to [facility].  Patient has remained stable while waiting on transfer.  He has been given IV fentanyl for pain.  Has been accepted to [facility].        Final Clinical Impression(s) / ED Diagnoses Final diagnoses:  Gastric perforation [facility])    Rx / DC Orders ED Discharge Orders     None         Pricilla Loveless, MD 07/07/23 1207

## 2023-07-07 NOTE — ED Notes (Signed)
Facesheet faxed to [facility] 

## 2023-07-07 NOTE — ED Notes (Signed)
Unsuccessful attempt at IV 

## 2023-07-25 ENCOUNTER — Other Ambulatory Visit: Payer: Self-pay

## 2023-07-25 ENCOUNTER — Emergency Department (HOSPITAL_COMMUNITY): Payer: 59

## 2023-07-25 ENCOUNTER — Emergency Department (HOSPITAL_COMMUNITY)
Admission: EM | Admit: 2023-07-25 | Discharge: 2023-07-25 | Disposition: A | Discharge status: Home / Self Care | Payer: 59 | Attending: Emergency Medicine | Admitting: Emergency Medicine

## 2023-07-25 DIAGNOSIS — R1084 Generalized abdominal pain: Secondary | ICD-10-CM

## 2023-07-25 DIAGNOSIS — Z992 Dependence on renal dialysis: Secondary | ICD-10-CM | POA: Insufficient documentation

## 2023-07-25 DIAGNOSIS — E875 Hyperkalemia: Secondary | ICD-10-CM | POA: Diagnosis not present

## 2023-07-25 DIAGNOSIS — N186 End stage renal disease: Secondary | ICD-10-CM | POA: Diagnosis not present

## 2023-07-25 DIAGNOSIS — Z79899 Other long term (current) drug therapy: Secondary | ICD-10-CM | POA: Insufficient documentation

## 2023-07-25 LAB — CBC WITH DIFFERENTIAL/PLATELET
Abs Immature Granulocytes: 0.02 10*3/uL (ref 0.00–0.07)
Basophils Absolute: 0.1 10*3/uL (ref 0.0–0.1)
Basophils Relative: 2 %
Eosinophils Absolute: 0.1 10*3/uL (ref 0.0–0.5)
Eosinophils Relative: 3 %
HCT: 31.9 % — ABNORMAL LOW (ref 39.0–52.0)
Hemoglobin: 10.3 g/dL — ABNORMAL LOW (ref 13.0–17.0)
Immature Granulocytes: 0 %
Lymphocytes Relative: 25 %
Lymphs Abs: 1.2 10*3/uL (ref 0.7–4.0)
MCH: 30 pg (ref 26.0–34.0)
MCHC: 32.3 g/dL (ref 30.0–36.0)
MCV: 93 fL (ref 80.0–100.0)
Monocytes Absolute: 0.6 10*3/uL (ref 0.1–1.0)
Monocytes Relative: 11 %
Neutro Abs: 3 10*3/uL (ref 1.7–7.7)
Neutrophils Relative %: 59 %
Platelets: 232 10*3/uL (ref 150–400)
RBC: 3.43 MIL/uL — ABNORMAL LOW (ref 4.22–5.81)
RDW: 16.7 % — ABNORMAL HIGH (ref 11.5–15.5)
WBC: 5 10*3/uL (ref 4.0–10.5)
nRBC: 0 % (ref 0.0–0.2)

## 2023-07-25 LAB — COMPREHENSIVE METABOLIC PANEL
ALT: 21 U/L (ref 0–44)
AST: 27 U/L (ref 15–41)
Albumin: 2.5 g/dL — ABNORMAL LOW (ref 3.5–5.0)
Alkaline Phosphatase: 88 U/L (ref 38–126)
Anion gap: 14 (ref 5–15)
BUN: 5 mg/dL — ABNORMAL LOW (ref 6–20)
CO2: 23 mmol/L (ref 22–32)
Calcium: 8.5 mg/dL — ABNORMAL LOW (ref 8.9–10.3)
Chloride: 99 mmol/L (ref 98–111)
Creatinine, Ser: 3.38 mg/dL — ABNORMAL HIGH (ref 0.61–1.24)
GFR, Estimated: 23 mL/min — ABNORMAL LOW (ref >60)
Glucose, Bld: 75 mg/dL (ref 70–99)
Potassium: 5.3 mmol/L — ABNORMAL HIGH (ref 3.5–5.1)
Sodium: 136 mmol/L (ref 135–145)
Total Bilirubin: 1 mg/dL (ref <1.2)
Total Protein: 6.3 g/dL — ABNORMAL LOW (ref 6.5–8.1)

## 2023-07-25 LAB — LIPASE, BLOOD: Lipase: 33 U/L (ref 11–51)

## 2023-07-25 MED ORDER — IOHEXOL 350 MG/ML SOLN
65.0000 mL | Freq: Once | INTRAVENOUS | Status: AC | PRN
  Administered 2023-07-25: 65 mL via INTRAVENOUS

## 2023-07-25 MED ORDER — MORPHINE SULFATE (PF) 4 MG/ML IV SOLN
4.0000 mg | Freq: Once | INTRAVENOUS | Status: AC
  Administered 2023-07-25: 4 mg via INTRAVENOUS
  Filled 2023-07-25: qty 1

## 2023-07-25 MED ORDER — SODIUM ZIRCONIUM CYCLOSILICATE 10 G PO PACK
10.0000 g | PACK | Freq: Once | ORAL | Status: AC
  Administered 2023-07-25: 10 g via ORAL
  Filled 2023-07-25: qty 1

## 2023-07-25 MED ORDER — ALUM & MAG HYDROXIDE-SIMETH 200-200-20 MG/5ML PO SUSP
30.0000 mL | Freq: Once | ORAL | Status: AC
  Administered 2023-07-25: 30 mL via ORAL
  Filled 2023-07-25: qty 30

## 2023-07-25 MED ORDER — ONDANSETRON HCL 4 MG/2ML IJ SOLN
4.0000 mg | Freq: Once | INTRAMUSCULAR | Status: AC
  Administered 2023-07-25: 4 mg via INTRAVENOUS
  Filled 2023-07-25: qty 2

## 2023-07-25 MED ORDER — FAMOTIDINE 20 MG PO TABS
20.0000 mg | ORAL_TABLET | Freq: Once | ORAL | Status: AC
  Administered 2023-07-25: 20 mg via ORAL
  Filled 2023-07-25: qty 1

## 2023-07-25 NOTE — ED Triage Notes (Signed)
Pt stated an ultrasound has to always be done on him because he a very difficult stick and wants to wait until he has a room in the back to get labs.

## 2023-07-25 NOTE — ED Triage Notes (Signed)
Pt complains of abd pain and nausea x 2 days. Pt had surgery 3 Happe ago to remove an abd ulcer at [facility]. Pt states pain is around surgical site. Pt feels weak and tired. Denies any fever.

## 2023-07-25 NOTE — ED Provider Notes (Signed)
[place] EMERGENCY DEPARTMENT AT [facility] Provider Note   CSN: [id number] Arrival date & time: 07/25/23  1141     History  Chief Complaint  Patient presents with  . Abdominal Pain    [name] is a 37 y.o. male.  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 Pain dull, constant, non radiating, generalized. No back/flank pain. No fever/chills. Nausea. No vomiting. Had bm yesterday, stools have been firm/hard. No dysuria. No chest pain.   The history is provided by the patient and medical records.  Abdominal Pain Associated symptoms: nausea   Associated symptoms: no chest pain, no cough, no fever, no shortness of breath and no sore throat        Home Medications Prior to Admission medications   Medication Sig Start Date End Date Taking? Authorizing Provider  albuterol (PROVENTIL HFA;VENTOLIN HFA) 108 (90 Base) MCG/ACT inhaler Inhale 2 puffs into the lungs every 4 (four) hours as needed for wheezing or shortness of breath. 05/14/16   [name], [name]  b complex-vitamin c-folic acid (NEPHRO-VITE) 0.8 MG TABS tablet Take 1 tablet by mouth daily.  08/16/16   [provider]  diclofenac Sodium (VOLTAREN) 1 % GEL Apply 4 g topically 4 (four) times daily as needed (Pain). 03/13/23   [provider]  famotidine (PEPCID) 20 MG tablet Take 1 tablet (20 mg total) by mouth daily. 04/27/19   [name], [name]  gabapentin (NEURONTIN) 250 MG/5ML solution Take 250 mg by mouth 3 (three) times daily. 03/12/23   [provider]  meloxicam (MOBIC) 15 MG tablet Take 0.5-1 tablets (7.5-15 mg total) by mouth daily as needed for pain. 08/01/19   [name], [name], MD  methocarbamol (ROBAXIN) 500 MG tablet Take 500 mg by mouth 2 (two) times daily. 03/13/23   [provider]   metoCLOPramide (REGLAN) 10 MG tablet Take 0.5 tablets (5 mg total) by mouth every 12 (twelve) hours as needed for up to 10 doses for refractory nausea / vomiting. 05/08/23   Terald Sleeper, MD  NEXIUM 40 MG capsule Take 40 mg by mouth daily. 03/12/23   [provider]  omeprazole (PRILOSEC) 20 MG capsule Take 1 capsule by mouth daily. 03/21/23   [provider]  ondansetron (ZOFRAN-ODT) 4 MG disintegrating tablet Take 1 tablet (4 mg total) by mouth every 8 (eight) hours as needed for nausea or vomiting. 04/03/23   [name], [name], MD  oxyCODONE (ROXICODONE) 5 MG/5ML solution Take by mouth. Patient not taking: Reported on 07/07/2023 03/13/23   [provider]  pantoprazole (PROTONIX) 40 MG tablet Take 1 tablet (40 mg total) by mouth daily. Take 30 minutes before breakfast. Patient taking differently: Take 40 mg by mouth daily as needed (Indigestion). 05/08/19   Unk [name], PA  sevelamer carbonate (RENVELA) 2.4 G PACK Take 2.4 g by mouth 3 (three) times daily with meals. Patient not taking: Reported on 07/07/2023    [provider]  sucralfate (CARAFATE) 1 GM/10ML suspension Take 1 g by mouth 4 (four) times daily.    [provider]  traMADol (ULTRAM) 50 MG tablet Take 1 tablet (50 mg total) by mouth every 12 (twelve) hours as needed. 12/02/22   [name], MD  VELPHORO 500 MG chewable tablet Chew 500 mg by mouth 3 (three) times daily  with meals. & twice daily with snacks Patient not taking: Reported on 07/07/2023 08/18/21   [provider]      Allergies    Dilaudid [hydromorphone hcl], Amlodipine, Ascorbate, and Midodrine    Review of Systems   Review of Systems  Constitutional:  Negative for fever.  HENT:  Negative for sore throat.   Eyes:  Negative for redness.  Respiratory:  Negative for cough and shortness of breath.   Cardiovascular:  Negative for chest pain.  Gastrointestinal:  Positive for abdominal pain and  nausea.  Genitourinary:  Negative for flank pain.  Musculoskeletal:  Negative for back pain and neck pain.  Skin:  Negative for rash.  Neurological:  Negative for headaches.  Psychiatric/Behavioral:  Negative for confusion.     Physical Exam Updated Vital Signs BP 103/75   Pulse 77   Temp 97.7 F (36.5 C)   Resp 18   Ht 1.651 m (5\' 5" )   Wt 97.1 kg   SpO2 100%   BMI 35.61 kg/m  Physical Exam Vitals and nursing note reviewed.  Constitutional:      Appearance: Normal appearance. He is well-developed.  HENT:     Head: Atraumatic.     Nose: Nose normal.     Mouth/Throat:     Mouth: Mucous membranes are moist.     Pharynx: Oropharynx is clear.  Eyes:     General: No scleral icterus.    Conjunctiva/sclera: Conjunctivae normal.  Neck:     Trachea: No tracheal deviation.  Cardiovascular:     Rate and Rhythm: Normal rate and regular rhythm.     Pulses: Normal pulses.     Heart sounds: Normal heart sounds. No murmur heard.    No friction rub. No gallop.  Pulmonary:     Effort: Pulmonary effort is normal. No accessory muscle usage or respiratory distress.     Breath sounds: Normal breath sounds.  Abdominal:     General: Bowel sounds are normal. There is no distension.     Palpations: Abdomen is soft. There is no mass.     Tenderness: There is abdominal tenderness. There is no guarding or rebound.     Hernia: No hernia is present.     Comments: Diffuse abd tenderness.  No obvious abd wall infection at surgical sites. No incarcerated hernia felt.   Genitourinary:    Comments: No cva tenderness. Musculoskeletal:        General: No swelling.     Cervical back: Normal range of motion and neck supple. No rigidity.  Skin:    General: Skin is warm and dry.     Findings: No rash.  Neurological:     Mental Status: He is alert.     Comments: Alert, speech clear.   Psychiatric:        Mood and Affect: Mood normal.    ED Results / Procedures / Treatments   Labs (all labs  ordered are listed, but only abnormal results are displayed) Results for orders placed or performed during the hospital encounter of 07/25/23  CBC with Differential   Collection Time: 07/25/23  1:58 PM  Result Value Ref Range   WBC 5.0 4.0 - 10.5 K/uL   RBC 3.43 (L) 4.22 - 5.81 MIL/uL   Hemoglobin 10.3 (L) 13.0 - 17.0 g/dL   HCT 78.2 (L) 95.6 - 21.3 %   MCV 93.0 80.0 - 100.0 fL   MCH 30.0 26.0 - 34.0 pg   MCHC 32.3 30.0 - 36.0 g/dL  RDW 16.7 (H) 11.5 - 15.5 %   Platelets 232 150 - 400 K/uL   nRBC 0.0 0.0 - 0.2 %   Neutrophils Relative % 59 %   Neutro Abs 3.0 1.7 - 7.7 K/uL   Lymphocytes Relative 25 %   Lymphs Abs 1.2 0.7 - 4.0 K/uL   Monocytes Relative 11 %   Monocytes Absolute 0.6 0.1 - 1.0 K/uL   Eosinophils Relative 3 %   Eosinophils Absolute 0.1 0.0 - 0.5 K/uL   Basophils Relative 2 %   Basophils Absolute 0.1 0.0 - 0.1 K/uL   Immature Granulocytes 0 %   Abs Immature Granulocytes 0.02 0.00 - 0.07 K/uL  Comprehensive metabolic panel   Collection Time: 07/25/23  1:58 PM  Result Value Ref Range   Sodium 136 135 - 145 mmol/L   Potassium 5.3 (H) 3.5 - 5.1 mmol/L   Chloride 99 98 - 111 mmol/L   CO2 23 22 - 32 mmol/L   Glucose, Bld 75 70 - 99 mg/dL   BUN 5 (L) 6 - 20 mg/dL   Creatinine, Ser 4.09 (H) 0.61 - 1.24 mg/dL   Calcium 8.5 (L) 8.9 - 10.3 mg/dL   Total Protein 6.3 (L) 6.5 - 8.1 g/dL   Albumin 2.5 (L) 3.5 - 5.0 g/dL   AST 27 15 - 41 U/L   ALT 21 0 - 44 U/L   Alkaline Phosphatase 88 38 - 126 U/L   Total Bilirubin 1.0 <1.2 mg/dL   GFR, Estimated 23 (L) >60 mL/min   Anion gap 14 5 - 15  Lipase, blood   Collection Time: 07/25/23  1:58 PM  Result Value Ref Range   Lipase 33 11 - 51 U/L   CT ABDOMEN PELVIS W CONTRAST Result Date: 07/25/2023 CLINICAL DATA:  Acute abdominal pain. The patient is 3 [place] postop after gastric ulcer repair. EXAM: CT ABDOMEN AND PELVIS WITH CONTRAST TECHNIQUE: Multidetector CT imaging of the abdomen and pelvis was performed using the  standard protocol following bolus administration of intravenous contrast. RADIATION DOSE REDUCTION: This exam was performed according to the departmental dose-optimization program which includes automated exposure control, adjustment of the mA and/or kV according to patient size and/or use of iterative reconstruction technique. CONTRAST:  65mL OMNIPAQUE IOHEXOL 350 MG/ML SOLN COMPARISON:  CT abdomen pelvis dated 07/07/2023. FINDINGS: Lower chest: Mild bibasilar atelectasis. Hepatobiliary: No focal liver abnormality is seen. Status post cholecystectomy. No biliary dilatation. Pancreas: Unremarkable. No pancreatic ductal dilatation or surrounding inflammatory changes. Spleen: Normal in size without focal abnormality. Adrenals/Urinary Tract: Adrenal glands are unremarkable. Numerous renal cysts with cortical thinning is redemonstrated. Multiple bilateral renal calcifications measure up to 4 mm in size. No obstructing renal or ureteral calculi. No hydronephrosis on either side. The bladder is nondistended. Stomach/Bowel: The patient is status post gastric bypass surgery. Appendix appears normal. There is colonic diverticulosis without evidence of diverticulitis. Submucosal fat in the colon is redemonstrated. No evidence of bowel wall thickening, distention, or inflammatory changes. Vascular/Lymphatic: No significant vascular findings are present. No enlarged abdominal or pelvic lymph nodes. Reproductive: Prostate is unremarkable. Other: There is a fat containing periumbilical local hernia. No free intraperitoneal fluid. Musculoskeletal: No acute or significant osseous findings. IMPRESSION: 1. No acute findings in the abdomen or pelvis. No findings to explain the patient's symptoms. Electronically Signed   By: Romona Curls M.D.   On: 07/25/2023 15:50   CT ABDOMEN PELVIS W CONTRAST Result Date: 07/07/2023 CLINICAL DATA:  Chest pain beginning this morning. Also with abdominal  pain. Patient underwent gastric bypass  surgery approximally 4 months ago. EXAM: CT ABDOMEN AND PELVIS WITH CONTRAST TECHNIQUE: Multidetector CT imaging of the abdomen and pelvis was performed using the standard protocol following bolus administration of intravenous contrast. RADIATION DOSE REDUCTION: This exam was performed according to the departmental dose-optimization program which includes automated exposure control, adjustment of the mA and/or kV according to patient size and/or use of iterative reconstruction technique. CONTRAST:  75mL OMNIPAQUE IOHEXOL 350 MG/ML SOLN COMPARISON:  06/28/2023. FINDINGS: Lower chest: No acute abnormality. Hepatobiliary: No focal liver abnormality is seen. Status post cholecystectomy. No biliary dilatation. Pancreas: Unremarkable. No pancreatic ductal dilatation or surrounding inflammatory changes. Spleen: Normal in size without focal abnormality. Adrenals/Urinary Tract: Normal adrenal glands. Both kidneys show significant cortical thinning and numerous cysts as well as parenchymal calcifications, stable from the prior exam. Largest cyst is exophytic from the posterolateral midpole the left kidney, 3.2 cm. No specific follow-up recommended. No suspicious renal mass. No hydronephrosis. Normal ureters. Bladder decompressed. Stomach/Bowel: There is a small amount of free intraperitoneal air collecting anteriorly in the right mid to upper abdomen. Hazy type inflammatory change with small bubbles of air is seen in the right upper quadrant adjacent to the anterior margin of the distal stomach. In this location, stomach wall appears ill-defined, which could reflect an ulceration and the source of the free air. There is also a focal area non dependent air and dependent fluid adjacent to the gastrojejunostomy anastomosis in the central upper abdomen, likely an outpouching, but possibly a extraluminal collection. This was present but smaller on the prior CT. There is significant thickening of the anterior wall the stomach  estimated 1.8 cm. Small bowel and colon are normal in caliber. No wall thickening. No other areas of inflammation. Normal appendix visualized. Vascular/Lymphatic: No significant vascular findings are present. No enlarged abdominal or pelvic lymph nodes. Reproductive: Unremarkable. Other: Trace ascites collects in the posterior pelvic recess. Musculoskeletal: No fracture or acute finding.  No bone lesion. IMPRESSION: 1. Positive for bowel perforation, suspected origin from the stomach. Small amount of free intraperitoneal air collecting anteriorly in the right mid to upper abdomen. This appears to be related to a perforated ulcer along the anterior margin of the stomach, either at/adjacent to the gastrojejunostomy site or along the distal antrum. Critical Value/emergent results were discussed by telephone at the time of interpretation on 07/07/2023 at 8:42 am to provider Pricilla Loveless , who verbally acknowledged these results. 2. Significant thickening of the anterior wall the stomach estimated 1.8 cm. This is suspected to be underlying inflammation/gastritis. 3. No other evidence of an acute abnormality. 4. 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 Electronically Signed   By: Amie Portland M.D.   On: 07/07/2023 08:42   DG CHEST PORT 1 VIEW Result Date: 07/07/2023 CLINICAL DATA:  Shortness of breath EXAM: PORTABLE CHEST 1 VIEW COMPARISON:  06/28/2023 FINDINGS: Low volume chest. There is no edema, consolidation, effusion, or pneumothorax. Normal heart size and mediastinal contours. Clips at the thoracic inlet from thyroidectomy. Artifact from EKG leads. IMPRESSION: Negative low volume chest. Electronically Signed   By: Tiburcio Pea M.D.   On: 07/07/2023 07:41   CT CHEST ABDOMEN PELVIS WO CONTRAST Result Date: 06/28/2023 CLINICAL DATA:  Chest pain with nausea vomiting since last night. Unintentional weight loss. Gastric bypass surgery in August. EXAM: CT CHEST,  ABDOMEN AND PELVIS WITHOUT CONTRAST TECHNIQUE: Multidetector CT imaging of the chest, abdomen and pelvis was performed  following the standard protocol without IV contrast. RADIATION DOSE REDUCTION: This exam was performed according to the departmental dose-optimization program which includes automated exposure control, adjustment of the mA and/or kV according to patient size and/or use of iterative reconstruction technique. COMPARISON:  Concurrent chest radiographs. Abdominopelvic CT 04/14/2023 and 04/03/2023. FINDINGS: CT CHEST FINDINGS Cardiovascular: No significant vascular findings are identified on noncontrast imaging. There are calcifications of the aortic valve. The heart size is normal. There is no pericardial effusion. Mediastinum/Nodes: There are no enlarged mediastinal, hilar or axillary lymph nodes. Surgical clips at the thoracic inlet with evidence of previous right thyroid lobectomy. Small hiatal hernia. Lungs/Pleura: No pleural effusion or pneumothorax. The lungs are clear. Musculoskeletal/Chest wall: No chest wall mass or suspicious osseous findings. CT ABDOMEN AND PELVIS FINDINGS Hepatobiliary: Decreased hepatic density consistent with steatosis. No focal lesions identified on noncontrast imaging. No evidence of biliary dilatation status post cholecystectomy. Pancreas: Unremarkable. No pancreatic ductal dilatation or surrounding inflammatory changes. Spleen: Normal in size without focal abnormality. Adrenals/Urinary Tract: Both adrenal glands appear normal. The kidneys appear stable with numerous cysts, scattered parenchymal calcifications and diffuse cortical thinning. There is no hydronephrosis, perinephric soft tissue stranding or suspicious cortical lesion. The bladder is empty. Stomach/Bowel: No enteric contrast administered. Stable postsurgical changes from previous gastric bypass procedure. There is no bowel distension, wall thickening or surrounding inflammation. The appendix appears normal.  Stable chronic intramural fat deposition within the colon. Vascular/Lymphatic: Mildly enlarged central mesenteric lymph nodes are similar to the most recent study, measuring up to 1.4 x 1.2 cm on image 69/2. These have enlarged compared with older CTs. No retroperitoneal or pelvic lymphadenopathy. No significant vascular findings on noncontrast imaging. Reproductive: The prostate gland and seminal vesicles appear unremarkable. Other: Stable postsurgical changes in the anterior abdominal wall. No significant hernia, ascites or pneumoperitoneum. Musculoskeletal: No acute or significant osseous findings. Stable chronic sclerosis along the sacroiliac joints. Unless specific follow-up recommendations are mentioned in the findings or impression sections, no imaging follow-up of any mentioned incidental findings is recommended. IMPRESSION: 1. No acute findings or explanation for the patient's symptoms in the chest, abdomen or pelvis. 2. Stable postsurgical changes from previous gastric bypass procedure. No evidence of bowel obstruction or inflammation. 3. Stable mildly enlarged central mesenteric lymph nodes, nonspecific although likely reactive. 4. Hepatic steatosis. 5. Stable chronic renal cortical thinning and cysts. 6.  Aortic Atherosclerosis (ICD10-I70.0). Electronically Signed   By: Carey Bullocks M.D.   On: 06/28/2023 13:47   DG Chest 2 View Result Date: 06/28/2023 CLINICAL DATA:  Chest pain with nausea and vomiting since last night. History of gastric bypass. EXAM: CHEST - 2 VIEW COMPARISON:  Radiographs 06/09/2023 and 05/30/2023. FINDINGS: The heart size and mediastinal contours are normal. The lungs are clear. There is no pleural effusion or pneumothorax. No acute osseous findings are identified. Telemetry leads overlie the chest. Stable surgical clips at the thoracic inlet. IMPRESSION: No evidence of acute cardiopulmonary process. Electronically Signed   By: Carey Bullocks M.D.   On: 06/28/2023 13:32      EKG None  Radiology CT ABDOMEN PELVIS W CONTRAST Result Date: 07/25/2023 CLINICAL DATA:  Acute abdominal pain. The patient is 3 [place] postop after gastric ulcer repair. EXAM: CT ABDOMEN AND PELVIS WITH CONTRAST TECHNIQUE: Multidetector CT imaging of the abdomen and pelvis was performed using the standard protocol following bolus administration of intravenous contrast. RADIATION DOSE REDUCTION: This exam was performed according to the departmental dose-optimization program which includes automated exposure control, adjustment of  the mA and/or kV according to patient size and/or use of iterative reconstruction technique. CONTRAST:  65mL OMNIPAQUE IOHEXOL 350 MG/ML SOLN COMPARISON:  CT abdomen pelvis dated 07/07/2023. FINDINGS: Lower chest: Mild bibasilar atelectasis. Hepatobiliary: No focal liver abnormality is seen. Status post cholecystectomy. No biliary dilatation. Pancreas: Unremarkable. No pancreatic ductal dilatation or surrounding inflammatory changes. Spleen: Normal in size without focal abnormality. Adrenals/Urinary Tract: Adrenal glands are unremarkable. Numerous renal cysts with cortical thinning is redemonstrated. Multiple bilateral renal calcifications measure up to 4 mm in size. No obstructing renal or ureteral calculi. No hydronephrosis on either side. The bladder is nondistended. Stomach/Bowel: The patient is status post gastric bypass surgery. Appendix appears normal. There is colonic diverticulosis without evidence of diverticulitis. Submucosal fat in the colon is redemonstrated. No evidence of bowel wall thickening, distention, or inflammatory changes. Vascular/Lymphatic: No significant vascular findings are present. No enlarged abdominal or pelvic lymph nodes. Reproductive: Prostate is unremarkable. Other: There is a fat containing periumbilical local hernia. No free intraperitoneal fluid. Musculoskeletal: No acute or significant osseous findings. IMPRESSION: 1. No acute findings in  the abdomen or pelvis. No findings to explain the patient's symptoms. Electronically Signed   By: Romona Curls M.D.   On: 07/25/2023 15:50    Procedures Procedures    Medications Ordered in ED Medications  morphine (PF) 4 MG/ML injection 4 mg (has no administration in time range)  morphine (PF) 4 MG/ML injection 4 mg (4 mg Intravenous Given 07/25/23 1359)  ondansetron (ZOFRAN) injection 4 mg (4 mg Intravenous Given 07/25/23 1359)  iohexol (OMNIPAQUE) 350 MG/ML injection 65 mL (65 mLs Intravenous Contrast Given 07/25/23 1525)  morphine (PF) 4 MG/ML injection 4 mg (4 mg Intravenous Given 07/25/23 1556)  alum & mag hydroxide-simeth (MAALOX/MYLANTA) 200-200-20 MG/5ML suspension 30 mL (30 mLs Oral Given 07/25/23 1711)  famotidine (PEPCID) tablet 20 mg (20 mg Oral Given 07/25/23 1711)  sodium zirconium cyclosilicate (LOKELMA) packet 10 g (10 g Oral Given 07/25/23 1711)    ED Course/ Medical Decision Making/ A&P                                 Medical Decision Making Problems Addressed: Abdominal pain, generalized: acute illness or injury with systemic symptoms that poses a threat to life or bodily functions ESRD on dialysis [facility]): chronic illness or injury with exacerbation, progression, or side effects of treatment that poses a threat to life or bodily functions Hyperkalemia: acute illness or injury  Amount and/or Complexity of Data Reviewed External Data Reviewed: notes. Labs: ordered. Decision-making details documented in ED Course. Radiology: ordered and independent interpretation performed. Decision-making details documented in ED Course. ECG/medicine tests: ordered and independent interpretation performed. Decision-making details documented in ED Course.  Risk OTC drugs. Prescription drug management. Parenteral controlled substances. Decision regarding hospitalization.   Iv ns. Continuous pulse ox and cardiac monitoring. Labs ordered/sent. Imaging ordered.   Differential  diagnosis includes abd abscess, bowel gas pain, perforation, etc. Dispo decision including potential need for admission considered - will get labs and imaging and reassess.   Reviewed nursing notes and prior charts for additional history. External reports reviewed.   Cardiac monitor: sinus rhythm, rate 90.  Iv. Morphine iv. Zofran iv.   Labs reviewed/interpreted by me - wbc normal. Hct 32.  K sl elev. Lokelma dose po.   Pain improved but persists. Morphine iv.   CT reviewed/interpreted by me - no sbo or abscess.   Recheck,  pt more comfortable. Abd soft. No peritoneal signs. No vomiting.   Pt tolerating po. Indicates has next HD set for Tuesday. No sob. No cp.  Pt currently appears stable for d/c.   Rec close pcp f/u and f/u with his surgeons as arranged.  Return precautions provided.           Final Clinical Impression(s) / ED Diagnoses Final diagnoses:  Abdominal pain, generalized  ESRD on dialysis [facility])  Hyperkalemia    Rx / DC Orders ED Discharge Orders     None         Cathren Laine, MD 07/25/23 [phone]

## 2023-07-25 NOTE — Discharge Instructions (Addendum)
It was our pleasure to provide your ER care today - we hope that you feel better.  Drink plenty of fluids/stay well hydrated.   Follow up closely with your surgeons as arranged with them.   Also follow up closely with primary care doctor this coming week.  Return to ER if worse, new symptoms, fevers, new or worsening or severe abdominal pain, persistent vomiting, chest pain, trouble breathing, weak/fainting, or other concern.   You were given pain meds in the ER - no driving for the next 6 hours.

## 2023-07-25 NOTE — ED Notes (Signed)
Went to discharge the patient. He stated he still felt lightheaded from the morphine. Assisted the patient in getting dressed and advised he could rest until he felt he was able to drive home.

## 2023-07-25 NOTE — ED Provider Triage Note (Signed)
Emergency Medicine Provider Triage Evaluation Note  [name] , a 37 y.o. male  was evaluated in triage.  Pt complains of abd pain.  Endorses acute abdominal pain since last night.  Endorsed nausea and vomiting.  No bowel movement since yesterday.  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  Patient is a dialysis patient and was at dialysis today finished approximately 3-hour 4 hours dialysis.  No fever but endorsing chills.  Review of Systems  Positive: As above Negative: As above  Physical Exam  BP 100/71 (BP Location: Right Arm)   Pulse 92   Temp 97.7 F (36.5 C)   Resp 18   Ht 5\' 5"  (1.651 m)   Wt 97.1 kg   SpO2 96%   BMI 35.61 kg/m  Gen:   Awake, no distress   Resp:  Normal effort  MSK:   Moves extremities without difficulty  Other:    Medical Decision Making  Medically screening exam initiated at 12:16 PM.  Appropriate orders placed.  [name] was informed that the remainder of the evaluation will be completed by another provider, this initial triage assessment does not replace that evaluation, and the importance of remaining in the ED until their evaluation is complete.     Fayrene Helper, [name] 07/25/23 1220

## 2024-01-20 ENCOUNTER — Ambulatory Visit: Attending: Cardiology | Admitting: Cardiology

## 2024-01-20 ENCOUNTER — Encounter: Payer: Self-pay | Admitting: Cardiology

## 2024-01-20 ENCOUNTER — Telehealth: Payer: Self-pay

## 2024-01-20 ENCOUNTER — Telehealth: Payer: Self-pay | Admitting: Radiology

## 2024-01-20 VITALS — BP 126/68 | HR 76 | Ht 65.0 in | Wt 180.2 lb

## 2024-01-20 DIAGNOSIS — I10 Essential (primary) hypertension: Secondary | ICD-10-CM

## 2024-01-20 DIAGNOSIS — R011 Cardiac murmur, unspecified: Secondary | ICD-10-CM | POA: Diagnosis not present

## 2024-01-20 DIAGNOSIS — G4733 Obstructive sleep apnea (adult) (pediatric): Secondary | ICD-10-CM

## 2024-01-20 NOTE — Telephone Encounter (Signed)
   Pre-operative Risk Assessment    Patient Name: [name]  DOB: 26-Aug-1985 MRN: [id number]   Date of last office visit: 01/20/24 Date of next office visit: n/a   Request for Surgical Clearance    Procedure:  Left partial knee arthroplasty lateral   Date of Surgery:  Clearance TBD                                 Surgeon:  Dr. Priscille Brought  Surgeon's Group or Practice Name:  [facility]  Phone number:  [phone] Fax number:  [phone]   Type of Clearance Requested:   - Medical    Type of Anesthesia:  Spinal   Additional requests/questions:    SignedZulema Hitchcock   01/20/2024, 4:31 PM

## 2024-01-20 NOTE — Patient Instructions (Addendum)
 Medication Instructions:  Your physician recommends that you continue on your current medications as directed. Please refer to the Current Medication list given to you today.  *If you need a refill on your cardiac medications before your next appointment, please call your pharmacy*  Lab Work: None.  If you have labs (blood work) drawn today and your tests are completely normal, you will receive your results only by: MyChart Message (if you have MyChart) OR A paper copy in the mail If you have any lab test that is abnormal or we need to change your treatment, we will call you to review the results.  Testing/Procedures: Your physician has recommended that you have a home sleep study. Please perform your home sleep study while NOT using your cpap.  This test records several body functions during sleep, including: brain activity, eye movement, oxygen and carbon dioxide blood levels, heart rate and rhythm, breathing rate and rhythm, the flow of air through your mouth and nose, snoring, body muscle movements, and chest and belly movement.  Your physician has requested that you have an echocardiogram. Echocardiography is a painless test that uses sound waves to create images of your heart. It provides your doctor with information about the size and shape of your heart and how well your heart's chambers and valves are working. This procedure takes approximately one hour. There are no restrictions for this procedure. Please do NOT wear cologne, perfume, aftershave, or lotions (deodorant is allowed). Please arrive 15 minutes prior to your appointment time.  Please note: We ask at that you not bring children with you during ultrasound (echo/ vascular) testing. Due to room size and safety concerns, children are not allowed in the ultrasound rooms during exams. Our front office staff cannot provide observation of children in our lobby area while testing is being conducted. An adult accompanying a patient to  their appointment will only be allowed in the ultrasound room at the discretion of the ultrasound technician under special circumstances. We apologize for any inconvenience.  Follow-Up: At [facility], you and your health needs are our priority.  As part of our continuing mission to provide you with exceptional heart care, our providers are all part of one team.  This team includes your primary Cardiologist (physician) and Advanced Practice Providers or APPs (Physician Assistants and Nurse Practitioners) who all work together to provide you with the care you need, when you need it.  Your next appointment:   1 year(s)  Provider:   Gaylyn Keas, MD

## 2024-01-20 NOTE — Progress Notes (Signed)
 Sleep Note    Date:  01/20/2024   ID:  [name], DOB 12/01/1985, MRN [id number]  PCP:  Charle Congo, MD  Cardiologist:  Gaylyn Keas, MD   Chief Complaint  Patient presents with   Sleep Apnea   Hypertension    History of Present Illness:  [name] is a 38 y.o. male  with a history of hypertension, GERD, end-stage renal disease related to HTN on HD who saw me last year for palpitations.  He wore a 30-day heart which showed no arrhythmias.  He also underwent sleep study which showed mild obstructive sleep apnea with an AHI of 11.6/h and was started on CPAP therapy.  He is now here for follow-up.  He is doing well with his PAP device and thinks that he has gotten used to it.  He tolerates the nasal mask and feels the pressure is adequate.  Since going on PAP he feels rested in the am and has no significant daytime sleepiness.  He denies any significant mouth or nasal dryness or nasal congestion.  He does not think that he snores.   He is working on getting ready for a renal tx.  He lost 150lbs after a gastric bypass and is questioning if he still needs CPAP.  Past Medical History:  Diagnosis Date   Arthritis    all over (11/19/2016)   Chronic lower back pain    Complication of anesthesia    A little while to wake up after knee surgery in 2008   Dizziness    when coming off of dialysis   ESRD (end stage renal disease) on dialysis [facility])    MWF and goes to [facility] (11/19/2016)   Family history of anesthesia complication    mom slow to wake up   GERD (gastroesophageal reflux disease)    takes Omeprazole  as needed   Headache(784.0)    monthly (11/19/2016)   History of hiatal hernia    Hyperparathyroidism (HCC)    Hypertension    Joint pain    Joint swelling    Morbid obesity (HCC)    PONV (postoperative nausea and vomiting)    Thyroid  disease     Past Surgical History:  Procedure Laterality Date   AV FISTULA PLACEMENT Bilateral 2005,2007   right;  left   AV FISTULA REPAIR Right 2007   tried to clean it out; couldn't do it   BOTOX  INJECTION N/A 05/25/2019   Procedure: BOTOX  INJECTION;  Surgeon: Albertina Hugger, MD;  Location: [facility];  Service: Gastroenterology;  Laterality: N/A;   BREATH TEK H PYLORI N/A 05/15/2014   Procedure: BREATH TEK H PYLORI;  Surgeon: Juanita Norlander, MD;  Location: [facility];  Service: General;  Laterality: N/A;   CHOLECYSTECTOMY N/A 11/23/2016   Procedure: LAPAROSCOPIC CHOLECYSTECTOMY WITH INTRAOPERATIVE CHOLANGIOGRAM;  Surgeon: Sim Dryer, MD;  Location: [facility] OR;  Service: General;  Laterality: N/A;   ESOPHAGOGASTRODUODENOSCOPY N/A 12/07/2014   Procedure: ESOPHAGOGASTRODUODENOSCOPY (EGD);  Surgeon: Juanita Norlander, MD;  Location: [facility] ENDOSCOPY;  Service: General;  Laterality: N/A;   ESOPHAGOGASTRODUODENOSCOPY N/A 12/17/2014   Procedure: ESOPHAGOGASTRODUODENOSCOPY (EGD);  Surgeon: Juanita Norlander, MD;  Location: [facility] OR;  Service: General;  Laterality: N/A;   ESOPHAGOGASTRODUODENOSCOPY N/A 08/16/2016   Procedure: ESOPHAGOGASTRODUODENOSCOPY (EGD);  Surgeon: Nannette Babe, MD;  Location: [facility] END[facility]COPY;  Service: Gastroenterology;  Laterality: N/A;   ESOPHAGOGASTRODUODENOSCOPY (EGD) WITH PROPOFOL  N/A 07/05/2017   Procedure: ESOPHAGOGASTRODUODENOSCOPY (EGD) WITH PROPOFOL ;  Surgeon: Janel Medford, MD;  Location:  [facility] ENDOSCOPY;  Service: Endoscopy;  Laterality: N/A;   ESOPHAGOGASTRODUODENOSCOPY (EGD) WITH PROPOFOL  N/A 05/25/2019   Procedure: ESOPHAGOGASTRODUODENOSCOPY (EGD) WITH PROPOFOL ;  Surgeon: Albertina Hugger, MD;  Location: [facility] ENDOSCOPY;  Service: Gastroenterology;  Laterality: N/A;   HERNIA REPAIR  11/2014   w/lap band OR   JOINT REPLACEMENT     KNEE ARTHROSCOPY Left 2007   LAPAROSCOPIC GASTRIC BANDING N/A 11/12/2014   Procedure: LAPAROSCOPIC GASTRIC BANDING;  Surgeon: Juanita Norlander, MD;  Location: [facility] ORS;  Service: General;  Laterality: N/A;   PARATHYROIDECTOMY N/A 03/30/2013   Procedure: TOTAL  PARATHYROIDECTOMY WITH AUTOTRANSPLANT;  Surgeon: Keitha Pata, MD;  Location: [facility] OR;  Service: General;  Laterality: N/A;  Autotransplant to left lower arm.   TOTAL KNEE ARTHROPLASTY Right 03/27/2014   Procedure: UNICOMPARTMENTAL ARTHROPLASTY;  Surgeon: Jasmine Mesi, MD;  Location: [facility] OR;  Service: Orthopedics;  Laterality: Right;    Current Medications: Current Meds  Medication Sig   albuterol  (PROVENTIL  HFA;VENTOLIN  HFA) 108 (90 Base) MCG/ACT inhaler Inhale 2 puffs into the lungs every 4 (four) hours as needed for wheezing or shortness of breath.   b complex-vitamin c-folic acid  (NEPHRO-VITE) 0.8 MG TABS tablet Take 1 tablet by mouth daily.    diclofenac  Sodium (VOLTAREN ) 1 % GEL Apply 4 g topically 4 (four) times daily as needed (Pain).   famotidine  (PEPCID ) 20 MG tablet Take 1 tablet (20 mg total) by mouth daily.   meloxicam  (MOBIC ) 15 MG tablet Take 0.5-1 tablets (7.5-15 mg total) by mouth daily as needed for pain.   metoCLOPramide  (REGLAN ) 10 MG tablet Take 0.5 tablets (5 mg total) by mouth every 12 (twelve) hours as needed for up to 10 doses for refractory nausea / vomiting.   NEXIUM 40 MG capsule Take 40 mg by mouth daily.   omeprazole  (PRILOSEC) 20 MG capsule Take 1 capsule by mouth daily.   ondansetron  (ZOFRAN -ODT) 4 MG disintegrating tablet Take 1 tablet (4 mg total) by mouth every 8 (eight) hours as needed for nausea or vomiting.   pantoprazole  (PROTONIX ) 40 MG tablet Take 1 tablet (40 mg total) by mouth daily. Take 30 minutes before breakfast. (Patient taking differently: Take 40 mg by mouth daily as needed (Indigestion).)   sucralfate (CARAFATE) 1 GM/10ML suspension Take 1 g by mouth 4 (four) times daily.    Allergies:   Dilaudid  [hydromorphone  hcl], Amlodipine, Ascorbate, and Midodrine   Social History   Socioeconomic History   Marital status: Single    Spouse name: Not on file   Number of children: 0   Years of education: Not on file   Highest education level: Not  on file  Occupational History   Not on file  Tobacco Use   Smoking status: Former    Current packs/day: 0.00    Average packs/day: 0.1 packs/day for 16.0 years (1.6 ttl pk-yrs)    Types: Cigarettes    Start date: 01/17/2003    Quit date: 01/17/2019    Years since quitting: 5.0   Smokeless tobacco: Never  Vaping Use   Vaping status: Never Used  Substance and Sexual Activity   Alcohol use: No    Alcohol/week: 0.0 standard drinks of alcohol   Drug use: No   Sexual activity: Yes  Other Topics Concern   Not on file  Social History Narrative   Not on file   Social Drivers of Health   Financial Resource Strain: Low Risk  (10/29/2023)   Received from [facility]   Overall Financial  Resource Strain (CARDIA)    Difficulty of Paying Living Expenses: Not hard at all  Food Insecurity: No Food Insecurity (10/29/2023)   Received from [facility]   Hunger Vital Sign    Within the past 12 months, you worried that your food would run out before you got the money to buy more.: Never true    Within the past 12 months, the food you bought just didn't last and you didn't have money to get more.: Never true  Transportation Needs: No Transportation Needs (10/29/2023)   Received from [facility]    Lack of Transportation (Medical): No    Lack of Transportation (Non-Medical): No  Physical Activity: Not on file  Stress: Not on file  Social Connections: Not on file     Family History:  The patient's family history includes Diabetes in his father and maternal grandmother; Hypertension in his mother; Kidney Stones in his sister; Liver cancer in his maternal uncle.   ROS:   Please see the history of present illness.    ROS All other systems reviewed and are negative.      No data to display           PHYSICAL EXAM:   VS:  BP 126/68   Pulse 76   Ht 5' 5 (1.651 m)   Wt 180 lb 3.2 oz (81.7 kg)   SpO2 99%   BMI 29.99 kg/m    GEN: Well nourished, well  developed in no acute distress HEENT: Normal NECK: No JVD; No carotid bruits LYMPHATICS: No lymphadenopathy CARDIAC:RRR, no rubs, gallops 2/6 SM at LLSB RESPIRATORY:  Clear to auscultation without rales, wheezing or rhonchi  ABDOMEN: Soft, non-tender, non-distended MUSCULOSKELETAL:  No edema; No deformity  SKIN: Warm and dry NEUROLOGIC:  Alert and oriented x 3 PSYCHIATRIC:  Normal affect  Wt Readings from Last 3 Encounters:  01/20/24 180 lb 3.2 oz (81.7 kg)  07/25/23 214 lb (97.1 kg)  06/28/23 226 lb (102.5 kg)      Studies/Labs Reviewed:     Recent Labs: 03/22/2023: TSH 5.20 06/28/2023: Magnesium 1.9 07/25/2023: ALT 21; BUN 5; Creatinine, Ser 3.38; Hemoglobin 10.3; Platelets 232; Potassium 5.3; Sodium 136   Lipid Panel    Component Value Date/Time   CHOL 153 03/22/2023 0432   TRIG 206 (H) 03/22/2023 0432   HDL 41 03/22/2023 0432   CHOLHDL 3.7 03/22/2023 0432   LDLCALC 82 03/22/2023 0432    Additional studies/ records that were reviewed today include:  Office visit notes from PCP    ASSESSMENT:    1. Essential hypertension   2. OSA (obstructive sleep apnea)       PLAN:  In order of problems listed above:  #Hypertension - BP well-controlled on exam today -He is currently not on antihypertensive therapy  #OSA - The patient is tolerating PAP therapy well without any problems. The PAP download performed by his DME was personally reviewed and interpreted by me today and showed an AHI of 3.3 /hr on auto PAP and 4-15 cm H2O with 10% compliance in using more than 4 hours nightly.  The patient has been using and benefiting from PAP use and will continue to benefit from therapy.  - he has missed a lot of nights using his device after his gastric bypass because he would not feel well and would go to sleep and forget to put it on.   -he would like to be retested to see if he  still needs CPAP as he has lost over 150lbs after gastric bypass -I encouraged him to be more  compliant with his device -I will check a HST to see if he still has residual OSA after wt loss  #Heart Mumur -check 2D echo  Followup with me in 1 year   Time Spent: 20 minutes total time of encounter, including 15 minutes spent in face-to-face patient care on the date of this encounter. This time includes coordination of care and counseling regarding above mentioned problem list. Remainder of non-face-to-face time involved reviewing chart documents/testing relevant to the patient encounter and documentation in the medical record. I have independently reviewed documentation from referring provider  Medication Adjustments/Labs and Tests Ordered: Current medicines are reviewed at length with the patient today.  Concerns regarding medicines are outlined above.  Medication changes, Labs and Tests ordered today are listed in the Patient Instructions below.  There are no Patient Instructions on file for this visit.   Signed, Gaylyn Keas, MD  01/20/2024 8:21 AM    [facility] [address] [place], [place], [place]  [address] Phone: [phone]; Fax: [phone]

## 2024-01-20 NOTE — Telephone Encounter (Signed)
 Patient agreement reviewed and signed on 01/20/2024.  WatchPAT issued to patient on 01/20/2024 by Nathalie Baize. Patient aware to not open the WatchPAT box until contacted with the activation PIN. Patient profile initialized in CloudPAT on 01/20/2024 by Jenise Mixer. Device serial number: [id number]  Please list Reason for Call as Advice Only and type WatchPAT issued to patient in the comment box.

## 2024-01-20 NOTE — Addendum Note (Signed)
 Addended by: Cherylyn Cos on: 01/20/2024 08:32 AM   Modules accepted: Orders

## 2024-01-20 NOTE — Telephone Encounter (Signed)
 Dr. Micael Adas,  Mr. Canterbury was seen by you in clinic today.  He is requesting preoperative cardiac evaluation for left partial knee arthroplasty.  Would you be able to comment on cardiac risk for upcoming surgery?  Thank you for your help.  Please direct your response to CV DIV preop pool.  Chet Cota. Tama Grosz NP-C     01/20/2024, 4:38 PM [facility] [address] Office [phone] Fax [phone]

## 2024-02-02 NOTE — Telephone Encounter (Signed)
   Patient Name: [name]  DOB: November 07, 1985 MRN: [id number]  Primary Cardiologist: Wilbert Bihari, MD  Chart reviewed as part of pre-operative protocol coverage. Given past medical history and time since last visit, based on ACC/AHA guidelines, [name] is at acceptable risk for the planned procedure without further cardiovascular testing. Echocardiogram was normal.  The patient was advised that if he develops new symptoms prior to surgery to contact our office to arrange for a follow-up visit, and he verbalized understanding.  I will route this recommendation to the requesting party via Epic fax function and remove from pre-op pool.  Please call with questions.  Lamarr Satterfield, NP 02/02/2024, 4:44 PM

## 2024-02-14 ENCOUNTER — Telehealth: Payer: Self-pay

## 2024-02-14 NOTE — Telephone Encounter (Signed)
 Per Dayna Dunn pt needs later appt. Pt schedule for VV on 8/4.

## 2024-02-14 NOTE — Telephone Encounter (Signed)
 Pt scheduled for VV on 03/02/24

## 2024-02-14 NOTE — Telephone Encounter (Signed)
 LVM asking pt to call our office to schedule VV for preop clearance.

## 2024-02-14 NOTE — Telephone Encounter (Signed)
  Patient Consent for Virtual Visit        [name] has provided verbal consent on 02/14/2024 for a virtual visit (video or telephone).   CONSENT FOR VIRTUAL VISIT FOR:  [name]  By participating in this virtual visit I agree to the following:  I hereby voluntarily request, consent and authorize [facility] and its employed or contracted physicians, physician assistants, nurse practitioners or other licensed health care professionals (the Practitioner), to provide me with telemedicine health care services (the "Services) as deemed necessary by the treating Practitioner. I acknowledge and consent to receive the Services by the Practitioner via telemedicine. I understand that the telemedicine visit will involve communicating with the Practitioner through live audiovisual communication technology and the disclosure of certain medical information by electronic transmission. I acknowledge that I have been given the opportunity to request an in-person assessment or other available alternative prior to the telemedicine visit and am voluntarily participating in the telemedicine visit.  I understand that I have the right to withhold or withdraw my consent to the use of telemedicine in the course of my care at any time, without affecting my right to future care or treatment, and that the Practitioner or I may terminate the telemedicine visit at any time. I understand that I have the right to inspect all information obtained and/or recorded in the course of the telemedicine visit and may receive copies of available information for a reasonable fee.  I understand that some of the potential risks of receiving the Services via telemedicine include:  Delay or interruption in medical evaluation due to technological equipment failure or disruption; Information transmitted may not be sufficient (e.g. poor resolution of images) to allow for appropriate medical decision making by the Practitioner;  and/or  In rare instances, security protocols could fail, causing a breach of personal health information.  Furthermore, I acknowledge that it is my responsibility to provide information about my medical history, conditions and care that is complete and accurate to the best of my ability. I acknowledge that Practitioner's advice, recommendations, and/or decision may be based on factors not within their control, such as incomplete or inaccurate data provided by me or distortions of diagnostic images or specimens that may result from electronic transmissions. I understand that the practice of medicine is not an exact science and that Practitioner makes no warranties or guarantees regarding treatment outcomes. I acknowledge that a copy of this consent can be made available to me via my patient portal [facility] MyChart), or I can request a printed copy by calling the office of [facility].    I understand that my insurance will be billed for this visit.   I have read or had this consent read to me. I understand the contents of this consent, which adequately explains the benefits and risks of the Services being provided via telemedicine.  I have been provided ample opportunity to ask questions regarding this consent and the Services and have had my questions answered to my satisfaction. I give my informed consent for the services to be provided through the use of telemedicine in my medical care

## 2024-02-14 NOTE — Telephone Encounter (Signed)
   Name: [name]  DOB: [birth year]  MRN: [id number]  Primary Cardiologist: Wilbert Bihari, MD   Chart reviewed as part of pre-operative protocol coverage. I am helping in preop today. Chart was retained in inbox since echo is scheduled March 02, 2024. I think prior clearance message by K. Lawrence, NP, was an error since echo has not yet been completed. I will route to surgeon the correction that we are still awaiting follow-up echocardiogram. Since MD did not specifically see patient for preop clearance, would go ahead and set up a virtual visit after echocardiogram.  Preoperative team, please contact this patient and set up a phone call appointment for further preoperative risk assessment after echocardiogram 03/02/24. Please obtain consent and complete medication review. Thank you for your help.  No blood thinners per MAR.  I also confirmed the patient resides in the state of [place] . As per [facility] Medical Board telemedicine laws, the patient must reside in the state in which the provider is licensed.   Raphael LOISE Bring, [name] 02/14/2024, 9:26 AM [facility]

## 2024-02-23 ENCOUNTER — Encounter: Payer: Self-pay | Admitting: Physical Medicine & Rehabilitation

## 2024-03-02 ENCOUNTER — Ambulatory Visit (HOSPITAL_COMMUNITY)
Admission: RE | Admit: 2024-03-02 | Discharge: 2024-03-02 | Disposition: A | Discharge status: Home / Self Care | Source: Ambulatory Visit | Attending: Cardiology | Admitting: Cardiology

## 2024-03-02 ENCOUNTER — Telehealth

## 2024-03-02 ENCOUNTER — Ambulatory Visit: Payer: Self-pay | Admitting: Cardiology

## 2024-03-02 ENCOUNTER — Encounter: Payer: Self-pay | Admitting: Cardiology

## 2024-03-02 DIAGNOSIS — I359 Nonrheumatic aortic valve disorder, unspecified: Secondary | ICD-10-CM | POA: Insufficient documentation

## 2024-03-02 DIAGNOSIS — R011 Cardiac murmur, unspecified: Secondary | ICD-10-CM | POA: Diagnosis present

## 2024-03-02 LAB — ECHOCARDIOGRAM COMPLETE
AR max vel: 2.62 cm2
AV Area VTI: 2.48 cm2
AV Area mean vel: 2.45 cm2
AV Mean grad: 7.6 mmHg
AV Peak grad: 13.9 mmHg
Ao pk vel: 1.86 m/s
Area-P 1/2: 4.28 cm2
S' Lateral: 3.3 cm

## 2024-03-02 NOTE — Telephone Encounter (Signed)
-----   Message from [name] sent at 03/02/2024  4:33 PM EDT ----- Echo showed normal pumping function of the heart EF 55 to 60% with normal global strain with mildly calcified aortic valve with mild aortic valve stenosis.  Cannot rule out bicuspid aortic valve.   Mean aortic valve gradient 7.6 mmHg, DVI 0.55 and SVI 51.  Please repeat 2D echo in 1 year for aortic valve disease ----- Message ----- From: Interface, [employer] One Seven Sent: 03/02/2024   4:08 PM EDT To: Alex JONELLE Bihari, MD

## 2024-03-02 NOTE — Telephone Encounter (Signed)
 Call to patient to review echo results. Patient verbalizes understanding that Echo showed normal pumping function of the heart EF 55 to 60% with normal global strain with mildly calcified aortic valve with mild aortic valve stenosis. Discussed possible bicuspid vs tricuspid valve. Patient agrees to repeat echo in one year.

## 2024-03-03 NOTE — Progress Notes (Signed)
 Virtual Visit via Telephone Note   Because of [name] co-morbid illnesses, he is at least at moderate risk for complications without adequate follow up.  This format is felt to be most appropriate for this patient at this time.  Due to technical limitations with video connection (technology), today's appointment will be conducted as an audio only telehealth visit, and [name] verbally agreed to proceed in this manner.   All issues noted in this document were discussed and addressed.  No physical exam could be performed with this format.  Evaluation Performed:  Preoperative cardiovascular risk assessment _____________   Date:  03/03/2024   Patient ID:  [name], DOB [birth year], MRN [id number] Patient Location:  Home Provider location:   Office  Primary Care Provider:  Shelda Atlas, MD Primary Cardiologist:  Alex Bihari, MD  Chief Complaint / Patient Profile   38 y.o. y/o male with a h/o hypertension GERD, OSA, who is pending left partial knee arthroplasty and presents today for telephonic preoperative cardiovascular risk assessment.  History of Present Illness    [name] is a 38 y.o. male who presents via audio/video conferencing for a telehealth visit today.  Pt was last seen in cardiology clinic on 01/20/2024 by Dr. Bihari.  At that time [name] was doing well .  The patient is now pending procedure as outlined above. Since his last visit, he continues to be stable from a cardiac standpoint.  Today he denies chest pain, shortness of breath, lower extremity edema, fatigue, palpitations, melena, hematuria, hemoptysis, diaphoresis, weakness, presyncope, syncope, orthopnea, and PND.   Past Medical History    Past Medical History:  Diagnosis Date   Aortic valve disease    Possible bicuspid aortic valve with mild aortic valve stenosis.  Mean aortic valve gradient 7.6 mmHg with DVI 0.55 and SVI from 51.  Aortic valve number of cusps indeterminate but  question bicuspid with calcification by echo 02/2024   Arthritis    all over (11/19/2016)   Chronic lower back pain    Complication of anesthesia    A little while to wake up after knee surgery in 2008   Dizziness    when coming off of dialysis   ESRD (end stage renal disease) on dialysis [facility])    MWF and goes to [facility] [employer] [facility] (11/19/2016)   Family history of anesthesia complication    mom slow to wake up   GERD (gastroesophageal reflux disease)    takes Omeprazole  as needed   Headache(784.0)    monthly (11/19/2016)   History of hiatal hernia    Hyperparathyroidism (HCC)    Hypertension    Joint pain    Joint swelling    Morbid obesity (HCC)    PONV (postoperative nausea and vomiting)    Thyroid  disease    Past Surgical History:  Procedure Laterality Date   AV FISTULA PLACEMENT Bilateral 2005,2007   right; left   AV FISTULA REPAIR Right 2007   tried to clean it out; couldn't do it   BOTOX  INJECTION N/A 05/25/2019   Procedure: BOTOX  INJECTION;  Surgeon: Legrand Victory CROME DOUGLAS, MD;  Location: [facility];  Service: Gastroenterology;  Laterality: N/A;   BREATH TEK H PYLORI N/A 05/15/2014   Procedure: BREATH TEK H PYLORI;  Surgeon: Alm Angle, MD;  Location: [facility];  Service: General;  Laterality: N/A;   CHOLECYSTECTOMY N/A 11/23/2016   Procedure: LAPAROSCOPIC CHOLECYSTECTOMY WITH INTRAOPERATIVE CHOLANGIOGRAM;  Surgeon: Debby Shipper, MD;  Location: [facility]  OR;  Service: General;  Laterality: N/A;   ESOPHAGOGASTRODUODENOSCOPY N/A 12/07/2014   Procedure: ESOPHAGOGASTRODUODENOSCOPY (EGD);  Surgeon: Alm Angle, MD;  Location: [facility] ENDOSCOPY;  Service: General;  Laterality: N/A;   ESOPHAGOGASTRODUODENOSCOPY N/A 12/17/2014   Procedure: ESOPHAGOGASTRODUODENOSCOPY (EGD);  Surgeon: Alm Angle, MD;  Location: [facility] OR;  Service: General;  Laterality: N/A;   ESOPHAGOGASTRODUODENOSCOPY N/A 08/16/2016   Procedure: ESOPHAGOGASTRODUODENOSCOPY (EGD);  Surgeon: Gordy CHRISTELLA Starch, MD;  Location: [facility]  ENDOSCOPY;  Service: Gastroenterology;  Laterality: N/A;   ESOPHAGOGASTRODUODENOSCOPY (EGD) WITH PROPOFOL  N/A 07/05/2017   Procedure: ESOPHAGOGASTRODUODENOSCOPY (EGD) WITH PROPOFOL ;  Surgeon: Teressa Toribio SQUIBB, MD;  Location: [facility] ENDOSCOPY;  Service: Endoscopy;  Laterality: N/A;   ESOPHAGOGASTRODUODENOSCOPY (EGD) WITH PROPOFOL  N/A 05/25/2019   Procedure: ESOPHAGOGASTRODUODENOSCOPY (EGD) WITH PROPOFOL ;  Surgeon: Legrand Victory LITTIE DOUGLAS, MD;  Location: [facility];  Service: Gastroenterology;  Laterality: N/A;   HERNIA REPAIR  11/2014   w/lap band OR   JOINT REPLACEMENT     KNEE ARTHROSCOPY Left 2007   LAPAROSCOPIC GASTRIC BANDING N/A 11/12/2014   Procedure: LAPAROSCOPIC GASTRIC BANDING;  Surgeon: Alm Angle, MD;  Location: [facility] ORS;  Service: General;  Laterality: N/A;   PARATHYROIDECTOMY N/A 03/30/2013   Procedure: TOTAL PARATHYROIDECTOMY WITH AUTOTRANSPLANT;  Surgeon: Krystal CHRISTELLA Spinner, MD;  Location: [facility] OR;  Service: General;  Laterality: N/A;  Autotransplant to left lower arm.   TOTAL KNEE ARTHROPLASTY Right 03/27/2014   Procedure: UNICOMPARTMENTAL ARTHROPLASTY;  Surgeon: [name], MD;  Location: [facility] OR;  Service: Orthopedics;  Laterality: Right;    Allergies  Allergies  Allergen Reactions   Dilaudid  [Hydromorphone  Hcl] Other (See Comments)    Patient became hypoxic to 60% with dilaudid  1mg  IV   Amlodipine Other (See Comments)   Ascorbate Itching   Midodrine Itching    Home Medications    Prior to Admission medications   Medication Sig Start Date End Date Taking? Authorizing Provider  albuterol  (PROVENTIL  HFA;VENTOLIN  HFA) 108 (90 Base) MCG/ACT inhaler Inhale 2 puffs into the lungs every 4 (four) hours as needed for wheezing or shortness of breath. 05/14/16   Pennie Elsie PARAS, [name]  b complex-vitamin c-folic acid  (NEPHRO-VITE) 0.8 MG TABS tablet Take 1 tablet by mouth daily.  08/16/16   [provider]  diclofenac  Sodium (VOLTAREN ) 1 % GEL Apply 4 g topically 4 (four) times  daily as needed (Pain). 03/13/23   [provider]  famotidine  (PEPCID ) 20 MG tablet Take 1 tablet (20 mg total) by mouth daily. 04/27/19   Donah Riis A, [name]  gabapentin (NEURONTIN) 250 MG/5ML solution Take 250 mg by mouth 3 (three) times daily. 03/12/23   [provider]  meloxicam  (MOBIC ) 15 MG tablet Take 0.5-1 tablets (7.5-15 mg total) by mouth daily as needed for pain. 08/01/19   [name], Ozell, MD  methocarbamol  (ROBAXIN ) 500 MG tablet Take 500 mg by mouth 2 (two) times daily. 03/13/23   [provider]  metoCLOPramide  (REGLAN ) 10 MG tablet Take 0.5 tablets (5 mg total) by mouth every 12 (twelve) hours as needed for up to 10 doses for refractory nausea / vomiting. 05/08/23   Cottie Donnice PARAS, MD  NEXIUM 40 MG capsule Take 40 mg by mouth daily. 03/12/23   [provider]  omeprazole  (PRILOSEC) 20 MG capsule Take 1 capsule by mouth daily. 03/21/23   [provider]  ondansetron  (ZOFRAN -ODT) 4 MG disintegrating tablet Take 1 tablet (4 mg total) by mouth every 8 (eight) hours as needed for nausea or vomiting. 04/03/23   [name], Charmaine  F, MD  oxyCODONE  (ROXICODONE ) 5 MG/5ML solution Take by mouth. 03/13/23   [provider]  pantoprazole  (PROTONIX ) 40 MG tablet Take 1 tablet (40 mg total) by mouth daily. Take 30 minutes before breakfast. Patient taking differently: Take 40 mg by mouth daily as needed (Indigestion). 05/08/19   Beather Delon Gibson, PA  sevelamer  carbonate (RENVELA ) 2.4 G PACK Take 2.4 g by mouth 3 (three) times daily with meals.    [provider]  sucralfate (CARAFATE) 1 GM/10ML suspension Take 1 g by mouth 4 (four) times daily.    [provider]  traMADol  (ULTRAM ) 50 MG tablet Take 1 tablet (50 mg total) by mouth every 12 (twelve) hours as needed. 12/02/22   Addie Cordella Hamilton, MD  VELPHORO 500 MG chewable tablet Chew 500 mg by mouth 3 (three) times daily with meals. & twice daily with snacks 08/18/21   [provider]    Physical Exam    Vital Signs:  [name] does not have vital signs available for review today.  Given telephonic nature of communication, physical exam is limited. AAOx3. NAD. Normal affect.  Speech and respirations are unlabored.  Accessory Clinical Findings    None  Assessment & Plan    1.  Preoperative Cardiovascular Risk Assessment: Left partial knee arthroplasty lateral    Date of Surgery:  Clearance TBD                                  Surgeon:  Dr. Toribio Higashi  Surgeon's Group or Practice Name:  [facility]  Phone number:  [phone] Fax number:  [phone]      Primary Cardiologist: Alex Bihari, MD  Chart reviewed as part of pre-operative protocol coverage. Given past medical history and time since last visit, based on ACC/AHA guidelines, [name] would be at acceptable risk for the planned procedure without further cardiovascular testing.   His RCRI is low risk, 0.9% risk of major cardiac event.  He is able to complete greater than 4 METS of physical activity.  Patient was advised that if he develops new symptoms prior to surgery to contact our office to arrange a follow-up appointment.  He verbalized understanding.    I will route this recommendation to the requesting party via Epic fax function and remove from pre-op pool.       Time:   Today, I have spent 5 minutes with the patient with telehealth technology discussing medical history, symptoms, and management plan.  I spent 10 minutes reviewing patient's past cardiac history and cardiac medications.    Josefa CHRISTELLA Beauvais, NP  03/03/2024, 3:14 PM

## 2024-03-06 ENCOUNTER — Ambulatory Visit: Attending: Cardiovascular Disease

## 2024-03-06 DIAGNOSIS — Z01810 Encounter for preprocedural cardiovascular examination: Secondary | ICD-10-CM

## 2024-04-11 ENCOUNTER — Encounter: Attending: Physical Medicine & Rehabilitation | Admitting: Physical Medicine & Rehabilitation

## 2024-04-21 ENCOUNTER — Other Ambulatory Visit: Payer: Self-pay | Admitting: Internal Medicine

## 2024-04-21 DIAGNOSIS — R109 Unspecified abdominal pain: Secondary | ICD-10-CM

## 2024-04-27 ENCOUNTER — Other Ambulatory Visit

## 2024-04-28 ENCOUNTER — Telehealth: Payer: Self-pay

## 2024-04-28 NOTE — Telephone Encounter (Signed)
 Ordering provider: Dr. Shlomo Associated diagnoses:  G47.33 (OSA (obstructive sleep apnea)) WatchPAT PA obtained on 04/28/2024 by Dena JAYSON Hesselbach, CMA. Authorization: No prior authorization is required per [facility] provider portal. Patient notified of PIN ([id number]) on 04/28/2024 via Notification Method: phone.  Phone note routed to covering staff for follow-up.

## 2024-05-02 ENCOUNTER — Other Ambulatory Visit

## 2024-05-04 ENCOUNTER — Ambulatory Visit: Payer: Self-pay | Admitting: Emergency Medicine

## 2024-05-04 DIAGNOSIS — G8929 Other chronic pain: Secondary | ICD-10-CM

## 2024-05-04 NOTE — H&P (Signed)
 **PATIENT NEEDS DIALYSIS TUESDAY 05/16/24 PRIOR TO SURGERY  PARTIAL VS TOTAL KNEE ADMISSION H&P  Patient is being admitted for left partial lateral knee arthroplasty.  Subjective:  Chief Complaint:left knee pain.  HPI: [name], 38 y.o. male, has a history of pain and functional disability in the left knee due to arthritis and has failed non-surgical conservative treatments for greater than 12 [place] to includeNSAID's and/or analgesics, corticosteriod injections, viscosupplementation injections, use of assistive devices, and activity modification.  Onset of symptoms was gradual, starting >10 years ago with gradually worsening course since that time. The patient noted no past surgery on the left knee(s).  Patient currently rates pain in the left knee(s) at 10 out of 10 with activity. Patient has night pain, worsening of pain with activity and weight bearing, pain that interferes with activities of daily living, and pain with passive range of motion.  Patient has evidence of periarticular osteophytes and joint space narrowing of the lateral compartment by imaging studies. There is no active infection.  Patient Active Problem List   Diagnosis Date Noted   Aortic valve disease    Abdominal pain 07/04/2017   Gastritis and gastroduodenitis 11/19/2016   Increased anion gap metabolic acidosis 11/19/2016   Intractable abdominal pain    Common bile duct dilatation    Cholelithiasis without cholecystitis    Intractable nausea and vomiting 10/08/2016   Hematemesis 08/14/2016   GERD (gastroesophageal reflux disease) 08/14/2016   Essential hypertension 08/14/2016   GIB (gastrointestinal bleeding) 08/14/2016   SIRS (systemic inflammatory response syndrome) (HCC) 08/14/2016   CKD (chronic kidney disease) stage V requiring chronic dialysis (HCC) 06/11/2016   Non-intractable vomiting with nausea 06/11/2016   Hepatic steatosis 06/11/2016   Cholelithiasis 06/11/2016   Localized osteoarthritis of left  knee 07/11/2015   Abdominal pain, epigastric 12/05/2014   Lapband April 2016 11/27/2014   Arthritis of knee 03/27/2014   Morbid obesity, weight 291, BMI - 47 02/15/2014   Hyperparathyroidism, secondary 03/14/2013   Past Medical History:  Diagnosis Date   Aortic valve disease    Possible bicuspid aortic valve with mild aortic valve stenosis.  Mean aortic valve gradient 7.6 mmHg with DVI 0.55 and SVI from 51.  Aortic valve number of cusps indeterminate but question bicuspid with calcification by echo 02/2024   Arthritis    all over (11/19/2016)   Chronic lower back pain    Complication of anesthesia    A little while to wake up after knee surgery in 2008   Dizziness    when coming off of dialysis   ESRD (end stage renal disease) on dialysis [facility])    MWF and goes to [employer] (11/19/2016)   Family history of anesthesia complication    mom slow to wake up   GERD (gastroesophageal reflux disease)    takes Omeprazole  as needed   Headache(784.0)    monthly (11/19/2016)   History of hiatal hernia    Hyperparathyroidism    Hypertension    Joint pain    Joint swelling    Morbid obesity (HCC)    PONV (postoperative nausea and vomiting)    Thyroid  disease     Past Surgical History:  Procedure Laterality Date   AV FISTULA PLACEMENT Bilateral 2005,2007   right; left   AV FISTULA REPAIR Right 2007   tried to clean it out; couldn't do it   BOTOX  INJECTION N/A 05/25/2019   Procedure: BOTOX  INJECTION;  Surgeon: Legrand Victory LITTIE DOUGLAS, MD;  Location: [facility] END[facility];  Service:  Gastroenterology;  Laterality: N/A;   BREATH TEK H PYLORI N/A 05/15/2014   Procedure: BREATH TEK H PYLORI;  Surgeon: Alm Angle, MD;  Location: [facility];  Service: General;  Laterality: N/A;   CHOLECYSTECTOMY N/A 11/23/2016   Procedure: LAPAROSCOPIC CHOLECYSTECTOMY WITH INTRAOPERATIVE CHOLANGIOGRAM;  Surgeon: Debby Shipper, MD;  Location: [facility] OR;  Service: General;  Laterality: N/A;   ESOPHAGOGASTRODUODENOSCOPY  N/A 12/07/2014   Procedure: ESOPHAGOGASTRODUODENOSCOPY (EGD);  Surgeon: Alm Angle, MD;  Location: [facility] ENDOSCOPY;  Service: General;  Laterality: N/A;   ESOPHAGOGASTRODUODENOSCOPY N/A 12/17/2014   Procedure: ESOPHAGOGASTRODUODENOSCOPY (EGD);  Surgeon: Alm Angle, MD;  Location[facility] OR;  Service: General;  Laterality: N/A;   ESOPHAGOGASTRODUODENOSCOPY N/A 08/16/2016   Procedure: ESOPHAGOGASTRODUODENOSCOPY (EGD);  Surgeon: Gordy CHRISTELLA Starch, MD;  Location: [facility] ENDOSCOPY;  Service: Gastroenterology;  Laterality: N/A;   ESOPHAGOGASTRODUODENOSCOPY (EGD) WITH PROPOFOL  N/A 07/05/2017   Procedure: ESOPHAGOGASTRODUODENOSCOPY (EGD) WITH PROPOFOL ;  Surgeon: Teressa Toribio SQUIBB, MD;  Location: [facility] ENDOSCOPY;  Service: Endoscopy;  Laterality: N/A;   ESOPHAGOGASTRODUODENOSCOPY (EGD) WITH PROPOFOL  N/A 05/25/2019   Procedure: ESOPHAGOGASTRODUODENOSCOPY (EGD) WITH PROPOFOL ;  Surgeon: Legrand Victory LITTIE DOUGLAS, MD;  Location: [facility];  Service: Gastroenterology;  Laterality: N/A;   HERNIA REPAIR  11/2014   w/lap band OR   JOINT REPLACEMENT     KNEE ARTHROSCOPY Left 2007   LAPAROSCOPIC GASTRIC BANDING N/A 11/12/2014   Procedure: LAPAROSCOPIC GASTRIC BANDING;  Surgeon: Alm Angle, MD;  Location: [facility] ORS;  Service: General;  Laterality: N/A;   PARATHYROIDECTOMY N/A 03/30/2013   Procedure: TOTAL PARATHYROIDECTOMY WITH AUTOTRANSPLANT;  Surgeon: Krystal CHRISTELLA Spinner, MD;  Location: [facility] OR;  Service: General;  Laterality: N/A;  Autotransplant to left lower arm.   TOTAL KNEE ARTHROPLASTY Right 03/27/2014   Procedure: UNICOMPARTMENTAL ARTHROPLASTY;  Surgeon: [name], MD;  Location: [facility] OR;  Service: Orthopedics;  Laterality: Right;    Current Outpatient Medications  Medication Sig Dispense Refill Last Dose/Taking   acetaminophen  (TYLENOL ) 500 MG tablet Take 500-1,000 mg by mouth every 6 (six) hours as needed (pain.).      albuterol  (PROVENTIL  HFA;VENTOLIN  HFA) 108 (90 Base) MCG/ACT inhaler Inhale 2 puffs into the lungs every 4 (four)  hours as needed for wheezing or shortness of breath. 1 Inhaler 0    b complex-vitamin c-folic acid  (NEPHRO-VITE) 0.8 MG TABS tablet Take 1 tablet by mouth daily.       diclofenac  Sodium (VOLTAREN ) 1 % GEL Apply 4 g topically 4 (four) times daily as needed (Pain).      famotidine  (PEPCID ) 20 MG tablet Take 1 tablet (20 mg total) by mouth daily. (Patient taking differently: Take 20 mg by mouth daily as needed for heartburn or indigestion.) 30 tablet 0    HYDROcodone -acetaminophen  (NORCO/VICODIN) 5-325 MG tablet Take 1 tablet by mouth every 8 (eight) hours as needed (pain.).      omeprazole  (PRILOSEC) 20 MG capsule Take 40 mg by mouth in the morning and at bedtime.      ondansetron  (ZOFRAN -ODT) 4 MG disintegrating tablet Take 1 tablet (4 mg total) by mouth every 8 (eight) hours as needed for nausea or vomiting. 20 tablet 0    sucralfate (CARAFATE) 1 GM/10ML suspension Take 1 g by mouth in the morning, at noon, and at bedtime.      traMADol  (ULTRAM ) 50 MG tablet Take 1 tablet (50 mg total) by mouth every 12 (twelve) hours as needed. 30 tablet 0    No current facility-administered medications for this visit.   Allergies  Allergen Reactions  Dilaudid  [Hydromorphone  Hcl] Other (See Comments)    Patient became hypoxic to 60% with dilaudid  1mg  IV   Amlodipine Other (See Comments)   Ascorbate Itching   Midodrine Itching   Nsaids     Gastric bypass patient should avoid NSAIDs    Social History   Tobacco Use   Smoking status: Former    Current packs/day: 0.00    Average packs/day: 0.1 packs/day for 16.0 years (1.6 ttl pk-yrs)    Types: Cigarettes    Start date: 01/17/2003    Quit date: 01/17/2019    Years since quitting: 5.2   Smokeless tobacco: Never  Substance Use Topics   Alcohol use: No    Alcohol/week: 0.0 standard drinks of alcohol    Family History  Problem Relation Age of Onset   Hypertension Mother    Diabetes Father    Kidney Stones Sister    Diabetes Maternal Grandmother     Liver cancer Maternal Uncle    Colon cancer Neg Hx    Colon polyps Neg Hx    Esophageal cancer Neg Hx    Rectal cancer Neg Hx    Stomach cancer Neg Hx      Review of Systems  Musculoskeletal:  Positive for arthralgias.  All other systems reviewed and are negative.   Objective:  Physical Exam Constitutional:      General: He is not in acute distress.    Appearance: Normal appearance. He is normal weight.  HENT:     Head: Normocephalic and atraumatic.  Eyes:     Extraocular Movements: Extraocular movements intact.     Conjunctiva/sclera: Conjunctivae normal.     Pupils: Pupils are equal, round, and reactive to light.  Cardiovascular:     Rate and Rhythm: Normal rate and regular rhythm.     Pulses: Normal pulses.     Heart sounds: Normal heart sounds.  Pulmonary:     Effort: Pulmonary effort is normal. No respiratory distress.     Breath sounds: Normal breath sounds.  Abdominal:     General: Bowel sounds are normal. There is no distension.     Palpations: Abdomen is soft.     Tenderness: There is no abdominal tenderness.  Musculoskeletal:        General: Tenderness present.     Cervical back: Normal range of motion and neck supple.     Comments: TTP over lateral joint line.  No calf tenderness, swelling, or erythema.  No overlying lesions of area of chief complaint.  Decreased strength and ROM due to elicited pain.  Dorsiflexion and plantarflexion intact.  Stable to varus and valgus stress.  BLE appear grossly neurovascularly intact.  Gait mildly antalgic.   Lymphadenopathy:     Cervical: No cervical adenopathy.  Skin:    General: Skin is warm and dry.     Capillary Refill: Capillary refill takes less than 2 seconds.     Findings: No erythema or rash.  Neurological:     General: No focal deficit present.     Mental Status: He is alert and oriented to person, place, and time.  Psychiatric:        Mood and Affect: Mood normal.        Behavior: Behavior normal.      Vital signs in last 24 hours: @VSRANGES @  Labs:   Estimated body mass index is 29.99 kg/m as calculated from the following:   Height as of 01/20/24: 5' 5 (1.651 m).   Weight as of 01/20/24: 81.7  kg.   Imaging Review Plain radiographs demonstrate moderate to severe degenerative joint disease of the left knee(s), appearing isolated to the lateral compartment. The overall alignment issignificant valgus. The bone quality appears to be fair for age and reported activity level.      Assessment/Plan:  End stage arthritis, left knee   The patient history, physical examination, clinical judgment of the provider and imaging studies are consistent with end stage degenerative joint disease of the left knee(s) and partial knee arthroplasty of the lateral compartment is deemed medically necessary. The treatment options including medical management, injection therapy arthroscopy and arthroplasty were discussed at length. The risks and benefits of total knee arthroplasty were presented and reviewed including the possibility of transitioning from partial to total knee arthroplasty dependent on surgical findings. The risks due to aseptic loosening, infection, stiffness, patella tracking problems, thromboembolic complications and other imponderables were discussed. The patient acknowledged the explanation, agreed to proceed with the plan and consent was signed. Patient is being admitted for inpatient treatment for surgery, pain control, PT, OT, prophylactic antibiotics, VTE prophylaxis, progressive ambulation and ADL's and discharge planning. The patient is planning to be discharged home with home health services    Anticipated LOS equal to or greater than 2 midnights due to - Age 42 and older with one or more of the following:  - Obesity  - Expected need for hospital services (PT, OT, Nursing) required for safe  discharge  - Anticipated need for postoperative skilled nursing care or inpatient  rehab  - Active co-morbidities: ESRD on dialysis, aortic valve disease, hyperparathyroidism, gastric ulcer disease, PONV, HTN, GI bleed 2018, OSA with use of CPAP (hx of) OR   - Unanticipated findings during/Post Surgery: None  - Patient is a high risk of re-admission due to: None

## 2024-05-04 NOTE — H&P (View-Only) (Signed)
 **PATIENT NEEDS DIALYSIS TUESDAY 05/16/24 PRIOR TO SURGERY  PARTIAL VS TOTAL KNEE ADMISSION H&P  Patient is being admitted for left partial lateral knee arthroplasty.  Subjective:  Chief Complaint:left knee pain.  HPI: [name], 38 y.o. male, has a history of pain and functional disability in the left knee due to arthritis and has failed non-surgical conservative treatments for greater than 12 [place] to includeNSAID's and/or analgesics, corticosteriod injections, viscosupplementation injections, use of assistive devices, and activity modification.  Onset of symptoms was gradual, starting >10 years ago with gradually worsening course since that time. The patient noted no past surgery on the left knee(s).  Patient currently rates pain in the left knee(s) at 10 out of 10 with activity. Patient has night pain, worsening of pain with activity and weight bearing, pain that interferes with activities of daily living, and pain with passive range of motion.  Patient has evidence of periarticular osteophytes and joint space narrowing of the lateral compartment by imaging studies. There is no active infection.  Patient Active Problem List   Diagnosis Date Noted   Aortic valve disease    Abdominal pain 07/04/2017   Gastritis and gastroduodenitis 11/19/2016   Increased anion gap metabolic acidosis 11/19/2016   Intractable abdominal pain    Common bile duct dilatation    Cholelithiasis without cholecystitis    Intractable nausea and vomiting 10/08/2016   Hematemesis 08/14/2016   GERD (gastroesophageal reflux disease) 08/14/2016   Essential hypertension 08/14/2016   GIB (gastrointestinal bleeding) 08/14/2016   SIRS (systemic inflammatory response syndrome) (HCC) 08/14/2016   CKD (chronic kidney disease) stage V requiring chronic dialysis (HCC) 06/11/2016   Non-intractable vomiting with nausea 06/11/2016   Hepatic steatosis 06/11/2016   Cholelithiasis 06/11/2016   Localized osteoarthritis of left  knee 07/11/2015   Abdominal pain, epigastric 12/05/2014   Lapband April 2016 11/27/2014   Arthritis of knee 03/27/2014   Morbid obesity, weight 291, BMI - 47 02/15/2014   Hyperparathyroidism, secondary 03/14/2013   Past Medical History:  Diagnosis Date   Aortic valve disease    Possible bicuspid aortic valve with mild aortic valve stenosis.  Mean aortic valve gradient 7.6 mmHg with DVI 0.55 and SVI from 51.  Aortic valve number of cusps indeterminate but question bicuspid with calcification by echo 02/2024   Arthritis    all over (11/19/2016)   Chronic lower back pain    Complication of anesthesia    A little while to wake up after knee surgery in 2008   Dizziness    when coming off of dialysis   ESRD (end stage renal disease) on dialysis [facility])    MWF and goes to [facility] (11/19/2016)   Family history of anesthesia complication    mom slow to wake up   GERD (gastroesophageal reflux disease)    takes Omeprazole  as needed   Headache(784.0)    monthly (11/19/2016)   History of hiatal hernia    Hyperparathyroidism    Hypertension    Joint pain    Joint swelling    Morbid obesity (HCC)    PONV (postoperative nausea and vomiting)    Thyroid  disease     Past Surgical History:  Procedure Laterality Date   AV FISTULA PLACEMENT Bilateral 2005,2007   right; left   AV FISTULA REPAIR Right 2007   tried to clean it out; couldn't do it   BOTOX  INJECTION N/A 05/25/2019   Procedure: BOTOX  INJECTION;  Surgeon: Legrand Victory LITTIE DOUGLAS, MD;  Location: [facility] ENDOSCOPY;  Service:  Gastroenterology;  Laterality: N/A;   BREATH TEK H PYLORI N/A 05/15/2014   Procedure: BREATH TEK H PYLORI;  Surgeon: Alm Angle, MD;  Location: [facility];  Service: General;  Laterality: N/A;   CHOLECYSTECTOMY N/A 11/23/2016   Procedure: LAPAROSCOPIC CHOLECYSTECTOMY WITH INTRAOPERATIVE CHOLANGIOGRAM;  Surgeon: Debby Shipper, MD;  Location: [facility] OR;  Service: General;  Laterality: N/A;   ESOPHAGOGASTRODUODENOSCOPY  N/A 12/07/2014   Procedure: ESOPHAGOGASTRODUODENOSCOPY (EGD);  Surgeon: Alm Angle, MD;  Location: [facility] ENDOSCOPY;  Service: General;  Laterality: N/A;   ESOPHAGOGASTRODUODENOSCOPY N/A 12/17/2014   Procedure: ESOPHAGOGASTRODUODENOSCOPY (EGD);  Surgeon: Alm Angle, MD;  Location: [facility] OR;  Service: General;  Laterality: N/A;   ESOPHAGOGASTRODUODENOSCOPY N/A 08/16/2016   Procedure: ESOPHAGOGASTRODUODENOSCOPY (EGD);  Surgeon: Gordy CHRISTELLA Starch, MD;  Location: [facility] ENDOSCOPY;  Service: Gastroenterology;  Laterality: N/A;   ESOPHAGOGASTRODUODENOSCOPY (EGD) WITH PROPOFOL  N/A 07/05/2017   Procedure: ESOPHAGOGASTRODUODENOSCOPY (EGD) WITH PROPOFOL ;  Surgeon: Teressa Toribio SQUIBB, MD;  Location: [facility] ENDOSCOPY;  Service: Endoscopy;  Laterality: N/A;   ESOPHAGOGASTRODUODENOSCOPY (EGD) WITH PROPOFOL  N/A 05/25/2019   Procedure: ESOPHAGOGASTRODUODENOSCOPY (EGD) WITH PROPOFOL ;  Surgeon: Legrand Victory LITTIE DOUGLAS, MD;  Location: [facility] ENDOSCOPY;  Service: Gastroenterology;  Laterality: N/A;   HERNIA REPAIR  11/2014   w/lap band OR   JOINT REPLACEMENT     KNEE ARTHROSCOPY Left 2007   LAPAROSCOPIC GASTRIC BANDING N/A 11/12/2014   Procedure: LAPAROSCOPIC GASTRIC BANDING;  Surgeon: Alm Angle, MD;  Location: [facility] ORS;  Service: General;  Laterality: N/A;   PARATHYROIDECTOMY N/A 03/30/2013   Procedure: TOTAL PARATHYROIDECTOMY WITH AUTOTRANSPLANT;  Surgeon: Krystal CHRISTELLA Spinner, MD;  Location: [facility] OR;  Service: General;  Laterality: N/A;  Autotransplant to left lower arm.   TOTAL KNEE ARTHROPLASTY Right 03/27/2014   Procedure: UNICOMPARTMENTAL ARTHROPLASTY;  Surgeon: [name], MD;  Location: [facility] OR;  Service: Orthopedics;  Laterality: Right;    Current Outpatient Medications  Medication Sig Dispense Refill Last Dose/Taking   acetaminophen  (TYLENOL ) 500 MG tablet Take 500-1,000 mg by mouth every 6 (six) hours as needed (pain.).      albuterol  (PROVENTIL  HFA;VENTOLIN  HFA) 108 (90 Base) MCG/ACT inhaler Inhale 2 puffs into the lungs every 4 (four)  hours as needed for wheezing or shortness of breath. 1 Inhaler 0    b complex-vitamin c-folic acid  (NEPHRO-VITE) 0.8 MG TABS tablet Take 1 tablet by mouth daily.       diclofenac  Sodium (VOLTAREN ) 1 % GEL Apply 4 g topically 4 (four) times daily as needed (Pain).      famotidine  (PEPCID ) 20 MG tablet Take 1 tablet (20 mg total) by mouth daily. (Patient taking differently: Take 20 mg by mouth daily as needed for heartburn or indigestion.) 30 tablet 0    HYDROcodone -acetaminophen  (NORCO/VICODIN) 5-325 MG tablet Take 1 tablet by mouth every 8 (eight) hours as needed (pain.).      omeprazole  (PRILOSEC) 20 MG capsule Take 40 mg by mouth in the morning and at bedtime.      ondansetron  (ZOFRAN -ODT) 4 MG disintegrating tablet Take 1 tablet (4 mg total) by mouth every 8 (eight) hours as needed for nausea or vomiting. 20 tablet 0    sucralfate (CARAFATE) 1 GM/10ML suspension Take 1 g by mouth in the morning, at noon, and at bedtime.      traMADol  (ULTRAM ) 50 MG tablet Take 1 tablet (50 mg total) by mouth every 12 (twelve) hours as needed. 30 tablet 0    No current facility-administered medications for this visit.   Allergies  Allergen Reactions  Dilaudid  [Hydromorphone  Hcl] Other (See Comments)    Patient became hypoxic to 60% with dilaudid  1mg  IV   Amlodipine Other (See Comments)   Ascorbate Itching   Midodrine Itching   Nsaids     Gastric bypass patient should avoid NSAIDs    Social History   Tobacco Use   Smoking status: Former    Current packs/day: 0.00    Average packs/day: 0.1 packs/day for 16.0 years (1.6 ttl pk-yrs)    Types: Cigarettes    Start date: 01/17/2003    Quit date: 01/17/2019    Years since quitting: 5.2   Smokeless tobacco: Never  Substance Use Topics   Alcohol use: No    Alcohol/week: 0.0 standard drinks of alcohol    Family History  Problem Relation Age of Onset   Hypertension Mother    Diabetes Father    Kidney Stones Sister    Diabetes Maternal Grandmother     Liver cancer Maternal Uncle    Colon cancer Neg Hx    Colon polyps Neg Hx    Esophageal cancer Neg Hx    Rectal cancer Neg Hx    Stomach cancer Neg Hx      Review of Systems  Musculoskeletal:  Positive for arthralgias.  All other systems reviewed and are negative.   Objective:  Physical Exam Constitutional:      General: He is not in acute distress.    Appearance: Normal appearance. He is normal weight.  HENT:     Head: Normocephalic and atraumatic.  Eyes:     Extraocular Movements: Extraocular movements intact.     Conjunctiva/sclera: Conjunctivae normal.     Pupils: Pupils are equal, round, and reactive to light.  Cardiovascular:     Rate and Rhythm: Normal rate and regular rhythm.     Pulses: Normal pulses.     Heart sounds: Normal heart sounds.  Pulmonary:     Effort: Pulmonary effort is normal. No respiratory distress.     Breath sounds: Normal breath sounds.  Abdominal:     General: Bowel sounds are normal. There is no distension.     Palpations: Abdomen is soft.     Tenderness: There is no abdominal tenderness.  Musculoskeletal:        General: Tenderness present.     Cervical back: Normal range of motion and neck supple.     Comments: TTP over lateral joint line.  No calf tenderness, swelling, or erythema.  No overlying lesions of area of chief complaint.  Decreased strength and ROM due to elicited pain.  Dorsiflexion and plantarflexion intact.  Stable to varus and valgus stress.  BLE appear grossly neurovascularly intact.  Gait mildly antalgic.   Lymphadenopathy:     Cervical: No cervical adenopathy.  Skin:    General: Skin is warm and dry.     Capillary Refill: Capillary refill takes less than 2 seconds.     Findings: No erythema or rash.  Neurological:     General: No focal deficit present.     Mental Status: He is alert and oriented to person, place, and time.  Psychiatric:        Mood and Affect: Mood normal.        Behavior: Behavior normal.      Vital signs in last 24 hours: @VSRANGES @  Labs:   Estimated body mass index is 29.99 kg/m as calculated from the following:   Height as of 01/20/24: 5' 5 (1.651 m).   Weight as of 01/20/24: 81.7  kg.   Imaging Review Plain radiographs demonstrate moderate to severe degenerative joint disease of the left knee(s), appearing isolated to the lateral compartment. The overall alignment issignificant valgus. The bone quality appears to be fair for age and reported activity level.      Assessment/Plan:  End stage arthritis, left knee   The patient history, physical examination, clinical judgment of the provider and imaging studies are consistent with end stage degenerative joint disease of the left knee(s) and partial knee arthroplasty of the lateral compartment is deemed medically necessary. The treatment options including medical management, injection therapy arthroscopy and arthroplasty were discussed at length. The risks and benefits of total knee arthroplasty were presented and reviewed including the possibility of transitioning from partial to total knee arthroplasty dependent on surgical findings. The risks due to aseptic loosening, infection, stiffness, patella tracking problems, thromboembolic complications and other imponderables were discussed. The patient acknowledged the explanation, agreed to proceed with the plan and consent was signed. Patient is being admitted for inpatient treatment for surgery, pain control, PT, OT, prophylactic antibiotics, VTE prophylaxis, progressive ambulation and ADL's and discharge planning. The patient is planning to be discharged home with home health services    Anticipated LOS equal to or greater than 2 midnights due to - Age 42 and older with one or more of the following:  - Obesity  - Expected need for hospital services (PT, OT, Nursing) required for safe  discharge  - Anticipated need for postoperative skilled nursing care or inpatient  rehab  - Active co-morbidities: ESRD on dialysis, aortic valve disease, hyperparathyroidism, gastric ulcer disease, PONV, HTN, GI bleed 2018, OSA with use of CPAP (hx of) OR   - Unanticipated findings during/Post Surgery: None  - Patient is a high risk of re-admission due to: None

## 2024-05-08 NOTE — Progress Notes (Signed)
 Surgical Instructions   Your procedure is scheduled on October 6. Report to [facility] Main Entrance A at 9:00 A.M., then check in with the Admitting office. Any questions or running late day of surgery: call [phone]  Questions prior to your surgery date: call [phone], Monday-Friday, 8am-4pm. If you experience any cold or flu symptoms such as cough, fever, chills, shortness of breath, etc. between now and your scheduled surgery, please notify us  at the above number.     Remember:  Do not eat after midnight the night before your surgery  You may drink clear liquids until 8:00am the morning of your surgery.   Clear liquids allowed are: Water , Non-Citrus Juices (without pulp), Carbonated Beverages, Clear Tea (no milk, honey, etc.), Black Coffee Only (NO MILK, CREAM OR POWDERED CREAMER of any kind), and Gatorade.  The night before surgery:  No food after midnight. ONLY clear liquids after midnight  The day of surgery (if you do NOT have diabetes):  Drink ONE (1) Pre-Surgery Clear Ensure by 8:00am the morning of surgery. Drink in one sitting. Do not sip.  This drink was given to you during your hospital  pre-op appointment visit.  Nothing else to drink after completing the  Pre-Surgery Clear Ensure.        If you have questions, please contact your surgeon's office.    Take these medicines the morning of surgery with A SIP OF WATER   omeprazole  (PRILOSEC)   May take these medicines IF NEEDED: albuterol  (PROVENTIL  HFA;VENTOLIN  HFA) 108 (90 Base) MCG/ACT inhaler -bring inhaler to the hospital HYDROcodone -acetaminophen  (NORCO/VICODIN)  ondansetron  (ZOFRAN -ODT)  traMADol  (ULTRAM )   One week prior to surgery, STOP taking any Aspirin  (unless otherwise instructed by your surgeon) Aleve, Naproxen, Ibuprofen , Motrin , Advil , Goody's, BC's, all herbal medications, fish oil, and non-prescription vitamins. This includes your diclofenac  Sodium (VOLTAREN ) 1 % GEL .                      Do NOT Smoke (Tobacco/Vaping) for 24 hours prior to your procedure.  If you use a CPAP at night, you may bring your mask/headgear for your overnight stay.   You will be asked to remove any contacts, glasses, piercing's, hearing aid's, dentures/partials prior to surgery. Please bring cases for these items if needed.    Patients discharged the day of surgery will not be allowed to drive home, and someone needs to stay with them for 24 hours.  SURGICAL WAITING ROOM VISITATION Patients may have no more than 2 support people in the waiting area - these visitors may rotate.   Pre-op nurse will coordinate an appropriate time for 1 ADULT support person, who may not rotate, to accompany patient in pre-op.  Children under the age of 9 must have an adult with them who is not the patient and must remain in the main waiting area with an adult.  If the patient needs to stay at the hospital during part of their recovery, the visitor guidelines for inpatient rooms apply.  Please refer to the [facility] website for the visitor guidelines for any additional information.   If you received a COVID test during your pre-op visit  it is requested that you wear a mask when out in public, stay away from anyone that may not be feeling well and notify your surgeon if you develop symptoms. If you have been in contact with anyone that has tested positive in the last 10 days please notify you surgeon.  Pre-operative 4 CHG Bathing Instructions   You can play a key role in reducing the risk of infection after surgery. Your skin needs to be as free of germs as possible. You can reduce the number of germs on your skin by washing with CHG (chlorhexidine  gluconate) soap before surgery. CHG is an antiseptic soap that kills germs and continues to kill germs even after washing.   DO NOT use if you have an allergy to chlorhexidine /CHG or antibacterial soaps. If your skin becomes reddened or irritated, stop using the CHG  and notify one of our RNs at [phone].   Please shower with the CHG soap starting 4 days before surgery using the following schedule:     Please keep in mind the following:  DO NOT shave, including legs and underarms, starting the day of your first shower.   You may shave your face at any point before/day of surgery.  Place clean sheets on your bed the day you start using CHG soap. Use a clean washcloth (not used since being washed) for each shower. DO NOT sleep with pets once you start using the CHG.   CHG Shower Instructions:  Wash your face and private area with normal soap. If you choose to wash your hair, wash first with your normal shampoo.  After you use shampoo/soap, rinse your hair and body thoroughly to remove shampoo/soap residue.  Turn the water  OFF and apply  bottle of CHG soap to a CLEAN washcloth.  Apply CHG soap ONLY FROM YOUR NECK DOWN TO YOUR TOES (washing for 3-5 minutes)  DO NOT use CHG soap on face, private areas, open wounds, or sores.  Pay special attention to the area where your surgery is being performed.  If you are having back surgery, having someone wash your back for you may be helpful. Wait 2 minutes after CHG soap is applied, then you may rinse off the CHG soap.  Pat dry with a clean towel  Put on clean clothes/pajamas   If you choose to wear lotion, please use ONLY the CHG-compatible lotions that are listed below.  Additional instructions for the day of surgery:  If you choose, you may shower the morning of surgery with an antibacterial soap.  DO NOT APPLY any lotions, deodorants, cologne, or perfumes.   Do not bring valuables to the hospital. [facility] is not responsible for any belongings/valuables. Do not wear nail polish, gel polish, artificial nails, or any other type of covering on natural nails (fingers and toes) Do not wear jewelry or makeup Put on clean/comfortable clothes.  Please brush your teeth.  Ask your nurse before applying any  prescription medications to the skin.     CHG Compatible Lotions   Aveeno Moisturizing lotion  Cetaphil Moisturizing Cream  Cetaphil Moisturizing Lotion  Clairol Herbal Essence Moisturizing Lotion, Dry Skin  Clairol Herbal Essence Moisturizing Lotion, Extra Dry Skin  Clairol Herbal Essence Moisturizing Lotion, Normal Skin  Curel Age Defying Therapeutic Moisturizing Lotion with Alpha Hydroxy  Curel Extreme Care Body Lotion  Curel Soothing Hands Moisturizing Hand Lotion  Curel Therapeutic Moisturizing Cream, Fragrance-Free  Curel Therapeutic Moisturizing Lotion, Fragrance-Free  Curel Therapeutic Moisturizing Lotion, Original Formula  Eucerin Daily Replenishing Lotion  Eucerin Dry Skin Therapy Plus Alpha Hydroxy Crme  Eucerin Dry Skin Therapy Plus Alpha Hydroxy Lotion  Eucerin Original Crme  Eucerin Original Lotion  Eucerin Plus Crme Eucerin Plus Lotion  Eucerin TriLipid Replenishing Lotion  Keri Anti-Bacterial Hand Lotion  Keri Deep Conditioning Original Lotion Dry  Skin Formula Softly Scented  Keri Deep Conditioning Original Lotion, Fragrance Free Sensitive Skin Formula  Keri Lotion Fast Absorbing Fragrance Free Sensitive Skin Formula  Keri Lotion Fast Absorbing Softly Scented Dry Skin Formula  Keri Original Lotion  Keri Skin Renewal Lotion Keri Silky Smooth Lotion  Keri Silky Smooth Sensitive Skin Lotion  Nivea Body Creamy Conditioning Oil  Nivea Body Extra Enriched Lotion  Nivea Body Original Lotion  Nivea Body Sheer Moisturizing Lotion Nivea Crme  Nivea Skin Firming Lotion  NutraDerm 30 Skin Lotion  NutraDerm Skin Lotion  NutraDerm Therapeutic Skin Cream  NutraDerm Therapeutic Skin Lotion  ProShield Protective Hand Cream  Provon moisturizing lotion  Please read over the following fact sheets that you were given.

## 2024-05-09 ENCOUNTER — Other Ambulatory Visit: Payer: Self-pay

## 2024-05-09 ENCOUNTER — Encounter (HOSPITAL_COMMUNITY): Payer: Self-pay

## 2024-05-09 ENCOUNTER — Encounter (HOSPITAL_COMMUNITY)
Admission: RE | Admit: 2024-05-09 | Discharge: 2024-05-09 | Disposition: A | Discharge status: Home / Self Care | Source: Ambulatory Visit | Attending: Orthopedic Surgery | Admitting: Orthopedic Surgery

## 2024-05-09 VITALS — BP 125/77 | HR 55 | Temp 97.9°F | Resp 18 | Ht 65.0 in | Wt 166.4 lb

## 2024-05-09 DIAGNOSIS — Z87891 Personal history of nicotine dependence: Secondary | ICD-10-CM | POA: Insufficient documentation

## 2024-05-09 DIAGNOSIS — Z992 Dependence on renal dialysis: Secondary | ICD-10-CM | POA: Insufficient documentation

## 2024-05-09 DIAGNOSIS — I120 Hypertensive chronic kidney disease with stage 5 chronic kidney disease or end stage renal disease: Secondary | ICD-10-CM | POA: Diagnosis not present

## 2024-05-09 DIAGNOSIS — Z9884 Bariatric surgery status: Secondary | ICD-10-CM | POA: Insufficient documentation

## 2024-05-09 DIAGNOSIS — K449 Diaphragmatic hernia without obstruction or gangrene: Secondary | ICD-10-CM | POA: Insufficient documentation

## 2024-05-09 DIAGNOSIS — K219 Gastro-esophageal reflux disease without esophagitis: Secondary | ICD-10-CM | POA: Diagnosis not present

## 2024-05-09 DIAGNOSIS — R011 Cardiac murmur, unspecified: Secondary | ICD-10-CM | POA: Insufficient documentation

## 2024-05-09 DIAGNOSIS — N186 End stage renal disease: Secondary | ICD-10-CM | POA: Diagnosis not present

## 2024-05-09 DIAGNOSIS — N2581 Secondary hyperparathyroidism of renal origin: Secondary | ICD-10-CM | POA: Diagnosis not present

## 2024-05-09 DIAGNOSIS — Z01812 Encounter for preprocedural laboratory examination: Secondary | ICD-10-CM | POA: Diagnosis present

## 2024-05-09 DIAGNOSIS — G8929 Other chronic pain: Secondary | ICD-10-CM

## 2024-05-09 DIAGNOSIS — Z01818 Encounter for other preprocedural examination: Secondary | ICD-10-CM

## 2024-05-09 DIAGNOSIS — M1712 Unilateral primary osteoarthritis, left knee: Secondary | ICD-10-CM | POA: Insufficient documentation

## 2024-05-09 HISTORY — DX: Sleep apnea, unspecified: G47.30

## 2024-05-09 LAB — CBC WITH DIFFERENTIAL/PLATELET
Abs Immature Granulocytes: 0.01 K/uL (ref 0.00–0.07)
Basophils Absolute: 0.1 K/uL (ref 0.0–0.1)
Basophils Relative: 1 %
Eosinophils Absolute: 0.2 K/uL (ref 0.0–0.5)
Eosinophils Relative: 3 %
HCT: 35.8 % — ABNORMAL LOW (ref 39.0–52.0)
Hemoglobin: 11.1 g/dL — ABNORMAL LOW (ref 13.0–17.0)
Immature Granulocytes: 0 %
Lymphocytes Relative: 27 %
Lymphs Abs: 1.4 K/uL (ref 0.7–4.0)
MCH: 30 pg (ref 26.0–34.0)
MCHC: 31 g/dL (ref 30.0–36.0)
MCV: 96.8 fL (ref 80.0–100.0)
Monocytes Absolute: 0.6 K/uL (ref 0.1–1.0)
Monocytes Relative: 11 %
Neutro Abs: 2.9 K/uL (ref 1.7–7.7)
Neutrophils Relative %: 58 %
Platelets: 140 K/uL — ABNORMAL LOW (ref 150–400)
RBC: 3.7 MIL/uL — ABNORMAL LOW (ref 4.22–5.81)
RDW: 14 % (ref 11.5–15.5)
WBC: 5.1 K/uL (ref 4.0–10.5)
nRBC: 0 % (ref 0.0–0.2)

## 2024-05-09 LAB — COMPREHENSIVE METABOLIC PANEL WITH GFR
ALT: 8 U/L (ref 0–44)
AST: 10 U/L — ABNORMAL LOW (ref 15–41)
Albumin: 3.4 g/dL — ABNORMAL LOW (ref 3.5–5.0)
Alkaline Phosphatase: 55 U/L (ref 38–126)
Anion gap: 11 (ref 5–15)
BUN: 21 mg/dL — ABNORMAL HIGH (ref 6–20)
CO2: 23 mmol/L (ref 22–32)
Calcium: 8 mg/dL — ABNORMAL LOW (ref 8.9–10.3)
Chloride: 105 mmol/L (ref 98–111)
Creatinine, Ser: 7.93 mg/dL — ABNORMAL HIGH (ref 0.61–1.24)
GFR, Estimated: 8 mL/min — ABNORMAL LOW (ref >60)
Glucose, Bld: 70 mg/dL (ref 70–99)
Potassium: 4.4 mmol/L (ref 3.5–5.1)
Sodium: 139 mmol/L (ref 135–145)
Total Bilirubin: 0.8 mg/dL (ref 0.0–1.2)
Total Protein: 6.6 g/dL (ref 6.5–8.1)

## 2024-05-09 LAB — TYPE AND SCREEN
ABO/RH(D): O NEG
Antibody Screen: NEGATIVE

## 2024-05-09 LAB — SURGICAL PCR SCREEN
MRSA, PCR: NEGATIVE
Staphylococcus aureus: NEGATIVE

## 2024-05-09 NOTE — Progress Notes (Signed)
 Surgical Instructions     Your procedure is scheduled on October 15th, Wednesday. Report to [facility] Main Entrance A at 9:00 A.M., then check in with the Admitting office. Any questions or running late day of surgery: call [phone]   Questions prior to your surgery date: call [phone], Monday-Friday, 8am-4pm. If you experience any cold or flu symptoms such as cough, fever, chills, shortness of breath, etc. between now and your scheduled surgery, please notify us  at the above number.            Remember:       Do not eat after midnight the night before your surgery   You may drink clear liquids until 8:00am the morning of your surgery.   Clear liquids allowed are: Water , Non-Citrus Juices (without pulp), Carbonated Beverages, Clear Tea (no milk, honey, etc.), Black Coffee Only (NO MILK, CREAM OR POWDERED CREAMER of any kind), and Gatorade.   The night before surgery:  No food after midnight. ONLY clear liquids after midnight   The day of surgery (if you do NOT have diabetes):  Drink ONE (1) Pre-Surgery Clear Ensure by 8:00am the morning of surgery. Drink in one sitting. Do not sip.  This drink was given to you during your hospital  pre-op appointment visit.   Nothing else to drink after completing the  Pre-Surgery Clear Ensure.        If you have questions, please contact your surgeon's office.         Take these medicines the morning of surgery with A SIP OF WATER   omeprazole  (PRILOSEC)    May take these medicines IF NEEDED: albuterol  (PROVENTIL  HFA;VENTOLIN  HFA) 108 (90 Base) MCG/ACT inhaler -bring inhaler to the hospital HYDROcodone -acetaminophen  (NORCO/VICODIN)  ondansetron  (ZOFRAN -ODT)  traMADol  (ULTRAM )    One week prior to surgery, STOP taking any Aspirin  (unless otherwise instructed by your surgeon) Aleve, Naproxen, Ibuprofen , Motrin , Advil , Goody's, BC's, all herbal medications, fish oil, and non-prescription vitamins. This includes your diclofenac  Sodium  (VOLTAREN ) 1 % GEL .                     Do NOT Smoke (Tobacco/Vaping) for 24 hours prior to your procedure.   If you use a CPAP at night, you may bring your mask/headgear for your overnight stay.   You will be asked to remove any contacts, glasses, piercing's, hearing aid's, dentures/partials prior to surgery. Please bring cases for these items if needed.    Patients discharged the day of surgery will not be allowed to drive home, and someone needs to stay with them for 24 hours.   SURGICAL WAITING ROOM VISITATION Patients may have no more than 2 support people in the waiting area - these visitors may rotate.   Pre-op nurse will coordinate an appropriate time for 1 ADULT support person, who may not rotate, to accompany patient in pre-op.  Children under the age of 20 must have an adult with them who is not the patient and must remain in the main waiting area with an adult.   If the patient needs to stay at the hospital during part of their recovery, the visitor guidelines for inpatient rooms apply.   Please refer to the [facility] website for the visitor guidelines for any additional information.     If you received a COVID test during your pre-op visit  it is requested that you wear a mask when out in public, stay away from anyone that may not be feeling  well and notify your surgeon if you develop symptoms. If you have been in contact with anyone that has tested positive in the last 10 days please notify you surgeon.         Pre-operative 4 CHG Bathing Instructions    You can play a key role in reducing the risk of infection after surgery. Your skin needs to be as free of germs as possible. You can reduce the number of germs on your skin by washing with CHG (chlorhexidine  gluconate) soap before surgery. CHG is an antiseptic soap that kills germs and continues to kill germs even after washing.    DO NOT use if you have an allergy to chlorhexidine /CHG or antibacterial soaps. If your  skin becomes reddened or irritated, stop using the CHG and notify one of our RNs at [phone].    Please shower with the CHG soap starting 4 days before surgery using the following schedule:       Please keep in mind the following:  DO NOT shave, including legs and underarms, starting the day of your first shower.   You may shave your face at any point before/day of surgery.  Place clean sheets on your bed the day you start using CHG soap. Use a clean washcloth (not used since being washed) for each shower. DO NOT sleep with pets once you start using the CHG.    CHG Shower Instructions:  Wash your face and private area with normal soap. If you choose to wash your hair, wash first with your normal shampoo.  After you use shampoo/soap, rinse your hair and body thoroughly to remove shampoo/soap residue.  Turn the water  OFF and apply  bottle of CHG soap to a CLEAN washcloth.  Apply CHG soap ONLY FROM YOUR NECK DOWN TO YOUR TOES (washing for 3-5 minutes)  DO NOT use CHG soap on face, private areas, open wounds, or sores.  Pay special attention to the area where your surgery is being performed.  If you are having back surgery, having someone wash your back for you may be helpful. Wait 2 minutes after CHG soap is applied, then you may rinse off the CHG soap.  Pat dry with a clean towel  Put on clean clothes/pajamas   If you choose to wear lotion, please use ONLY the CHG-compatible lotions that are listed below.   Additional instructions for the day of surgery:   If you choose, you may shower the morning of surgery with an antibacterial soap.  DO NOT APPLY any lotions, deodorants, cologne, or perfumes.   Do not bring valuables to the hospital. [facility] is not responsible for any belongings/valuables. Do not wear nail polish, gel polish, artificial nails, or any other type of covering on natural nails (fingers and toes) Do not wear jewelry or makeup Put on clean/comfortable clothes.   Please brush your teeth.  Ask your nurse before applying any prescription medications to the skin.        CHG Compatible Lotions    Aveeno Moisturizing lotion  Cetaphil Moisturizing Cream  Cetaphil Moisturizing Lotion  Clairol Herbal Essence Moisturizing Lotion, Dry Skin  Clairol Herbal Essence Moisturizing Lotion, Extra Dry Skin  Clairol Herbal Essence Moisturizing Lotion, Normal Skin  Curel Age Defying Therapeutic Moisturizing Lotion with Alpha Hydroxy  Curel Extreme Care Body Lotion  Curel Soothing Hands Moisturizing Hand Lotion  Curel Therapeutic Moisturizing Cream, Fragrance-Free  Curel Therapeutic Moisturizing Lotion, Fragrance-Free  Curel Therapeutic Moisturizing Lotion, Original Formula  Eucerin Daily Replenishing Lotion  Eucerin Dry Skin Therapy Plus Alpha Hydroxy Crme  Eucerin Dry Skin Therapy Plus Alpha Hydroxy Lotion  Eucerin Original Crme  Eucerin Original Lotion  Eucerin Plus Crme Eucerin Plus Lotion  Eucerin TriLipid Replenishing Lotion  Keri Anti-Bacterial Hand Lotion  Keri Deep Conditioning Original Lotion Dry Skin Formula Softly Scented  Keri Deep Conditioning Original Lotion, Fragrance Free Sensitive Skin Formula  Keri Lotion Fast Absorbing Fragrance Free Sensitive Skin Formula  Keri Lotion Fast Absorbing Softly Scented Dry Skin Formula  Keri Original Lotion  Keri Skin Renewal Lotion Keri Silky Smooth Lotion  Keri Silky Smooth Sensitive Skin Lotion  Nivea Body Creamy Conditioning Oil  Nivea Body Extra Enriched Lotion  Nivea Body Original Lotion  Nivea Body Sheer Moisturizing Lotion Nivea Crme  Nivea Skin Firming Lotion  NutraDerm 30 Skin Lotion  NutraDerm Skin Lotion  NutraDerm Therapeutic Skin Cream  NutraDerm Therapeutic Skin Lotion  ProShield Protective Hand Cream  Provon moisturizing lotion   Please read over the following fact sheets that you were given.

## 2024-05-09 NOTE — Progress Notes (Signed)
 PCP - Aliene Colon Cardiologist - Dr. Wilbert Bihari  PPM/ICD - Denies Device Orders - n/a Rep Notified - n/a  Chest x-ray - N/A EKG - 12-19-23 Stress Test - 09-14-23 CE ECHO - 03-02-24 Cardiac Cath - Denies  Sleep Study - Yes, CPAP - no longer wears one after going through bypass surgery  NON-diabetic  Last dose of GLP1 agonist-  Denies GLP1 instructions: n/a  Blood Thinner Instructions: Denies Aspirin  Instructions:Denies  ERAS Protcol - Clears until 0800 PRE-SURGERY Ensure or G2- Ensure  COVID TEST- n/a   Anesthesia review: Yes, ESRD on dialysis MWF, HTN, sleep apnea. Patient states he will be going to dialysis on Tuesday, October 14th prior to his surgery.  Patient denies shortness of breath, fever, cough and chest pain at PAT appointment. Patient denies any respiratory issues at this time.    All instructions explained to the patient, with a verbal understanding of the material. Patient agrees to go over the instructions while at home for a better understanding. Patient also instructed to self quarantine after being tested for COVID-19. The opportunity to ask questions was provided.

## 2024-05-11 NOTE — Progress Notes (Signed)
 Anesthesia Chart Review:  Case: [id number] Date/Time: 05/17/24 1045   Procedure: ARTHROPLASTY, KNEE, UNICOMPARTMENTAL (Left: Knee) - LATERAL   Anesthesia type: Spinal   Pre-op diagnosis: OA LEFT KNEE   [facility]: [facility] RO[facility] 11 / [facility] OR   Surgeons: [name], MD       DISCUSSION: Patient is a 38 year old male scheduled for the above procedure.  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  He is followed by cardiologist Dr. Shlomo for OSA, palpitations, and HTN. He had mild OSA with AHI 11.6/hr by study in 07/2021. At 01/20/2024 follow-up he was using CPAP, but had lost 150 lb post gastric bypass, so a repeat HST planned. She also ordered an echo to evaluate for murmur. TTE on 01/20/2024 showed LVEF 55-60%, no RWMA, normal RV systolic function, concern for bicuspid valve with mild AS, AVA VTI 2.48 cm, AV peak gradient 13.9 mmHg, AV mean gradient 7.6 mmHg, aortic root 3.0 cm, ascending aorta 3.10 cm. One year office follow-up planned.     He had a preoperative cardiology evaluation was done on 03/06/2024 by Emelia Hazy, NP. He wrote, Given past medical history and time since last visit, based on ACC/AHA guidelines, DHANUSH JOKERST would be at acceptable risk for the planned procedure without  further cardiovascular testing.    His RCRI is low risk, 0.9% risk of major cardiac event.  He is able to complete greater than 4 METS of physical activity.  He normally dialyzes on MWF. Since surgery is scheduled on a usual HD day, he will having HD the day prior to surgery (05/16/2024).  Anesthesia team to evaluate on the day of surgery. Will need iSTAT on arrival given ESRD.     VS: BP 125/77   Pulse (!) 55   Temp 36.6 C   Resp 18   Ht 5' 5 (1.651 m)   Wt 75.5 kg   SpO2 100%   BMI 27.69 kg/m   PROVIDERS: Shelda Atlas, MD is PCP Shlomo Corning, MD is cardiologist Marlee Motto, MD is nephrologist.   LABS: Preoperative labs noted. For iSTAT on the day of surgery due to ESRD. (all labs ordered are listed, but only abnormal results are displayed)  Labs Reviewed  CBC WITH DIFFERENTIAL/PLATELET - Abnormal; Notable for the following components:      Result Value   RBC 3.70 (*)    Hemoglobin 11.1 (*)    HCT 35.8 (*)    Platelets 140 (*)    All other components within normal limits  COMPREHENSIVE METABOLIC PANEL WITH GFR - Abnormal; Notable for the following components:   BUN 21 (*)    Creatinine, Ser 7.93 (*)    Calcium  8.0 (*)    Albumin  3.4 (*)    AST 10 (*)    GFR, Estimated 8 (*)    All other components within normal limits  SURGICAL PCR SCREEN  TYPE AND SCREEN    OTHER: EGD 03/21/2024 [facility]): IMPRESSION: - Normal esophagus. - Normal  gastric body. - Roux-en-Y gastrojejunostomy with gastrojejunal anastomosis characterized by marginal ulceration, similar to prior. - Normal examined jejunum. - No specimens collected.   Sleep Study 07/15/2021:  FINDINGS: 1. Mild Obstructive Sleep Apnea with AHI 11.6/hr. 2. No Central Sleep Apnea with pAHIc 0.2/hr. 3. Oxygen desaturations as low as 87%. 4. Severe snoring was present. O2 sats were < 88% for 0.2 min. 5. Total sleep time was 6 hrs and 43 min. 6. 28.5% of total sleep time was spent in REM sleep. 7. Normal  sleep onset latency at . 8. Shortened REM sleep onset latency at 67 min. 9. Total awakenings were 12.   IMAGES: CT Chest/Abd/pelvis 06/28/2023: IMPRESSION: 1. No acute findings or explanation for the patient's symptoms in the chest, abdomen or pelvis. 2. Stable postsurgical changes from previous gastric bypass procedure. No evidence of bowel obstruction or inflammation. 3. Stable mildly enlarged central mesenteric lymph nodes, nonspecific although likely reactive. 4. Hepatic steatosis. 5. Stable chronic renal cortical thinning and cysts. 6.  Aortic Atherosclerosis (ICD10-I70.0).   EKG: 07/25/2023: NSR   CV: Echo 01/20/2024: IMPRESSIONS   1. Left ventricular ejection fraction, by estimation, is 55 to 60%. Left  ventricular ejection fraction by 3D volume is 56 %. The left ventricle has  normal function. The left ventricle has no regional wall motion  abnormalities. Left ventricular diastolic   parameters were normal. The average left ventricular global longitudinal  strain is -21.4 %. The global longitudinal strain is normal.   2. Right ventricular systolic function is normal. The right ventricular  size is normal.   3. The mitral valve is normal in structure. No evidence of mitral valve  regurgitation. No evidence of mitral stenosis.   4. Poorly visualized but given age and presence of calcification and mild  disease concern for bicuspid AV. The aortic valve has an indeterminant  number of cusps. There is mild calcification of the aortic valve. Aortic  valve regurgitation is not  visualized. Mild aortic valve stenosis.  AV Area (Vmax):    2.62 cm  AV Area (Vmean):   2.45 cm  AV Area (VTI):     2.48 cm  AV Vmax:           186.20 cm/s  AV Vmean:          127.600 cm/s  AV VTI:            0.387 m  AV Peak Grad:      13.9 mmHg  AV Mean Grad:      7.6 mmHg  LVOT Vmax:         107.67 cm/s  LVOT Vmean:        69.133 cm/s  LVOT VTI:          0.212 m  LVOT/AV VTI ratio:  0.55  AORTA  Ao Root diam: 3.00 cm  Ao Asc diam:  3.10 cm   5. The inferior vena cava is normal in size with greater than 50%  respiratory variability, suggesting right atrial pressure of 3 mmHg.    Cardiac event monitor 05/04/2022 - 06/02/2022:   Predominant rhythm was normal sinus rhythm with an average heart rate of 80 beats minute and ranged from 48 to 132 bpm   No arrhythmias noted  Past Medical History:  Diagnosis Date   Aortic valve disease    Possible bicuspid aortic valve with mild aortic valve stenosis.  Mean aortic valve gradient 7.6 mmHg with DVI 0.55 and SVI from 51.  Aortic valve number  of cusps indeterminate but question bicuspid with calcification by echo 02/2024   Arthritis    all over (11/19/2016)   Chronic lower back pain    Complication of anesthesia    A little while to wake up after knee surgery in 2008   Dizziness    when coming off of dialysis   ESRD (end stage renal disease) on dialysis [facility])    MWF and goes to [facility] (11/19/2016)   Family history of anesthesia complication    mom slow to wake up   GERD (gastroesophageal reflux disease)    takes Omeprazole  as needed   Headache(784.0)    monthly (11/19/2016)   History of hiatal hernia    Hyperparathyroidism    Hypertension    Joint pain    Joint swelling    Morbid obesity (HCC)    PONV (postoperative nausea and vomiting)    Sleep apnea    Thyroid  disease     Past Surgical History:  Procedure Laterality Date   AV FISTULA PLACEMENT Bilateral 2005,2007   right; left   AV FISTULA REPAIR Right 2007   tried to clean it out; couldn't do it   BOTOX  INJECTION N/A 05/25/2019   Procedure: BOTOX  INJECTION;  Surgeon: Legrand Victory LITTIE DOUGLAS, MD;  Location: [facility] ENDOSCOPY;  Service: Gastroenterology;  Laterality: N/A;   BREATH TEK H PYLORI N/A 05/15/2014   Procedure: BREATH TEK H PYLORI;  Surgeon: Alm Angle, MD;  Location: [facility];  Service: General;  Laterality: N/A;   CHOLECYSTECTOMY N/A  11/23/2016   Procedure: LAPAROSCOPIC CHOLECYSTECTOMY WITH INTRAOPERATIVE CHOLANGIOGRAM;  Surgeon: Debby Shipper, MD;  Location: [facility] OR;  Service: General;  Laterality: N/A;   ESOPHAGOGASTRODUODENOSCOPY N/A 12/07/2014   Procedure: ESOPHAGOGASTRODUODENOSCOPY (EGD);  Surgeon: Alm Angle, MD;  Location: [facility] ENDOSCOPY;  Service: General;  Laterality: N/A;   ESOPHAGOGASTRODUODENOSCOPY N/A 12/17/2014   Procedure: ESOPHAGOGASTRODUODENOSCOPY (EGD);  Surgeon: Alm Angle, MD;  Location: [facility] OR;  Service: General;  Laterality: N/A;   ESOPHAGOGASTRODUODENOSCOPY N/A 08/16/2016   Procedure: ESOPHAGOGASTRODUODENOSCOPY (EGD);  Surgeon: Gordy CHRISTELLA Starch, MD;  Location: [facility] ENDOSCOPY;  Service: Gastroenterology;  Laterality: N/A;   ESOPHAGOGASTRODUODENOSCOPY (EGD) WITH PROPOFOL  N/A 07/05/2017   Procedure: ESOPHAGOGASTRODUODENOSCOPY (EGD) WITH PROPOFOL ;  Surgeon: Teressa Toribio SQUIBB, MD;  Location: [facility] ENDOSCOPY;  Service: Endoscopy;  Laterality: N/A;   ESOPHAGOGASTRODUODENOSCOPY (EGD) WITH PROPOFOL  N/A 05/25/2019   Procedure: ESOPHAGOGASTRODUODENOSCOPY (EGD) WITH PROPOFOL ;  Surgeon: Legrand Victory LITTIE DOUGLAS, MD;  Location: [facility];  Service: Gastroenterology;  Laterality: N/A;   HERNIA REPAIR  11/2014   w/lap band OR   JOINT REPLACEMENT     KNEE ARTHROSCOPY Left 2007   LAPAROSCOPIC GASTRIC BANDING N/A 11/12/2014   Procedure: LAPAROSCOPIC GASTRIC BANDING;  Surgeon: Alm Angle, MD;  Location: [facility] ORS;  Service: General;  Laterality: N/A;   PARATHYROIDECTOMY N/A 03/30/2013   Procedure: TOTAL PARATHYROIDECTOMY WITH AUTOTRANSPLANT;  Surgeon: Krystal CHRISTELLA Spinner, MD;  Location[facility] OR;  Service: General;  Laterality: N/A;  Autotransplant to left lower arm.   TOTAL KNEE ARTHROPLASTY Right 03/27/2014   Procedure: UNICOMPARTMENTAL ARTHROPLASTY;  Surgeon: [name], MD;  Location: [facility] OR;  Service: Orthopedics;  Laterality: Right;    MEDICATIONS:  acetaminophen  (TYLENOL ) 500 MG tablet   albuterol  (PROVENTIL  HFA;VENTOLIN  HFA) 108 (90  Base) MCG/ACT inhaler   b complex-vitamin c-folic acid  (NEPHRO-VITE) 0.8 MG TABS tablet   diclofenac  Sodium (VOLTAREN ) 1 % GEL   famotidine  (PEPCID ) 20 MG tablet   HYDROcodone -acetaminophen  (NORCO/VICODIN) 5-325 MG tablet   omeprazole  (PRILOSEC) 20 MG  capsule   ondansetron  (ZOFRAN -ODT) 4 MG disintegrating tablet   sucralfate (CARAFATE) 1 GM/10ML suspension   traMADol  (ULTRAM ) 50 MG tablet   No current facility-administered medications for this encounter.    Isaiah Ruder, [name] Surgical Short Stay/Anesthesiology [facility] Phone [phone] [facility] Phone [phone] 05/11/2024 8:45 AM

## 2024-05-11 NOTE — Anesthesia Preprocedure Evaluation (Addendum)
 Anesthesia Evaluation  Patient identified by MRN, date of birth, ID band Patient awake    Reviewed: Allergy & Precautions, H&P , NPO status , Patient's Chart, lab work & pertinent test results  History of Anesthesia Complications (+) PONV, Family history of anesthesia reaction and history of anesthetic complications  Airway Mallampati: II  TM Distance: <3 FB Neck ROM: Full    Dental no notable dental hx.    Pulmonary sleep apnea , Patient abstained from smoking., former smoker   breath sounds clear to auscultation       Cardiovascular Exercise Tolerance: Good hypertension, Normal cardiovascular exam Rhythm:Regular Rate:Normal   CV: Echo 01/20/2024: IMPRESSIONS   1. Left ventricular ejection fraction, by estimation, is 55 to 60%. Left  ventricular ejection fraction by 3D volume is 56 %. The left ventricle has  normal function. The left ventricle has no regional wall motion  abnormalities. Left ventricular diastolic   parameters were normal. The average left ventricular global longitudinal  strain is -21.4 %. The global longitudinal strain is normal.   2. Right ventricular systolic function is normal. The right ventricular  size is normal.   3. The mitral valve is normal in structure. No evidence of mitral valve  regurgitation. No evidence of mitral stenosis.   4. Poorly visualized but given age and presence of calcification and mild  disease concern for bicuspid AV. The aortic valve has an indeterminant  number of cusps. There is mild calcification of the aortic valve. Aortic  valve regurgitation is not  visualized. Mild aortic valve stenosis.     Neuro/Psych  Headaches negative neurological ROS  negative psych ROS   GI/Hepatic negative GI ROS, Neg liver ROS, hiatal hernia,GERD  Medicated,,  Endo/Other  negative endocrine ROS    Renal/GU DialysisRenal diseasenegative Renal ROS  negative genitourinary    Musculoskeletal negative musculoskeletal ROS (+) Arthritis ,    Abdominal   Peds negative pediatric ROS (+)  Hematology negative hematology ROS (+)   Anesthesia Other Findings   Reproductive/Obstetrics negative OB ROS                              Anesthesia Physical Anesthesia Plan  ASA: 3  Anesthesia Plan: General and Regional   Post-op Pain Management: Tylenol  PO (pre-op)*, Dilaudid  IV and Regional block*   Induction: Intravenous  PONV Risk Score and Plan: 3 and Ondansetron , Dexamethasone  and Treatment may vary due to age or medical condition  Airway Management Planned: Oral ETT and LMA  Additional Equipment: None  Intra-op Plan:   Post-operative Plan: Extubation in OR  Informed Consent: I have reviewed the patients History and Physical, chart, labs and discussed the procedure including the risks, benefits and alternatives for the proposed anesthesia with the patient or authorized representative who has indicated his/her understanding and acceptance.     Dental advisory given  Plan Discussed with: CRNA and Anesthesiologist  Anesthesia Plan Comments: (PAT note written 05/11/2024 by Isaiah Ruder, [name].    DISCUSSION: Patient is a 38 year old male scheduled for the above procedure.   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   He is followed by cardiologist Dr. Shlomo for OSA, palpitations, and HTN. He had mild OSA with AHI 11.6/hr by study in 07/2021. At 01/20/2024 follow-up he was using CPAP, but had lost 150 lb post gastric bypass, so a repeat HST planned. She also ordered an echo to evaluate for murmur. TTE on 01/20/2024 showed LVEF 55-60%, no RWMA, normal RV systolic function, concern for bicuspid valve with mild AS, AVA VTI 2.48 cm, AV peak gradient 13.9 mmHg, AV mean gradient 7.6 mmHg, aortic root 3.0 cm, ascending aorta 3.10 cm. One year office follow-up planned.     He had a preoperative cardiology evaluation was done on 03/06/2024 by Emelia Hazy, NP. He wrote, Given past medical history and time since last visit, based on ACC/AHA guidelines, COLBE VIVIANO would be at acceptable risk for the planned procedure without further cardiovascular testing.    His RCRI is low risk, 0.9% risk of major cardiac event.  He is able to complete greater than 4 METS of physical activity.   He normally dialyzes on MWF. Since surgery is scheduled on a usual HD day, he will having HD the day prior to surgery (05/16/2024).   Anesthesia team to evaluate on the day of surgery. Will need iSTAT on arrival given ESRD.    )         Anesthesia Quick Evaluation

## 2024-05-17 ENCOUNTER — Encounter (HOSPITAL_COMMUNITY): Payer: Self-pay | Admitting: Orthopedic Surgery

## 2024-05-17 ENCOUNTER — Telehealth (HOSPITAL_COMMUNITY): Payer: Self-pay

## 2024-05-17 ENCOUNTER — Encounter (HOSPITAL_COMMUNITY)
Admission: AD | Disposition: A | Discharge status: Home / Self Care | Payer: Self-pay | Source: Home / Self Care | Attending: Orthopedic Surgery

## 2024-05-17 ENCOUNTER — Other Ambulatory Visit (HOSPITAL_COMMUNITY): Payer: Self-pay

## 2024-05-17 ENCOUNTER — Ambulatory Visit (HOSPITAL_COMMUNITY): Admitting: Anesthesiology

## 2024-05-17 ENCOUNTER — Ambulatory Visit (HOSPITAL_COMMUNITY): Payer: Self-pay | Admitting: Physician Assistant

## 2024-05-17 ENCOUNTER — Observation Stay (HOSPITAL_COMMUNITY)

## 2024-05-17 ENCOUNTER — Inpatient Hospital Stay (HOSPITAL_COMMUNITY)
Admission: AD | Admit: 2024-05-17 | Discharge: 2024-05-22 | DRG: 469 | Disposition: A | Discharge status: Home / Self Care | Attending: Orthopedic Surgery | Admitting: Orthopedic Surgery

## 2024-05-17 ENCOUNTER — Other Ambulatory Visit: Payer: Self-pay

## 2024-05-17 DIAGNOSIS — D631 Anemia in chronic kidney disease: Secondary | ICD-10-CM | POA: Diagnosis present

## 2024-05-17 DIAGNOSIS — Z886 Allergy status to analgesic agent status: Secondary | ICD-10-CM

## 2024-05-17 DIAGNOSIS — K760 Fatty (change of) liver, not elsewhere classified: Secondary | ICD-10-CM | POA: Diagnosis present

## 2024-05-17 DIAGNOSIS — Z6829 Body mass index (BMI) 29.0-29.9, adult: Secondary | ICD-10-CM

## 2024-05-17 DIAGNOSIS — N185 Chronic kidney disease, stage 5: Secondary | ICD-10-CM | POA: Diagnosis not present

## 2024-05-17 DIAGNOSIS — M1712 Unilateral primary osteoarthritis, left knee: Secondary | ICD-10-CM

## 2024-05-17 DIAGNOSIS — Z7901 Long term (current) use of anticoagulants: Secondary | ICD-10-CM

## 2024-05-17 DIAGNOSIS — Z87891 Personal history of nicotine dependence: Secondary | ICD-10-CM

## 2024-05-17 DIAGNOSIS — I350 Nonrheumatic aortic (valve) stenosis: Secondary | ICD-10-CM | POA: Diagnosis present

## 2024-05-17 DIAGNOSIS — Z96651 Presence of right artificial knee joint: Secondary | ICD-10-CM | POA: Diagnosis present

## 2024-05-17 DIAGNOSIS — Z96652 Presence of left artificial knee joint: Secondary | ICD-10-CM | POA: Diagnosis present

## 2024-05-17 DIAGNOSIS — Z8249 Family history of ischemic heart disease and other diseases of the circulatory system: Secondary | ICD-10-CM

## 2024-05-17 DIAGNOSIS — N186 End stage renal disease: Secondary | ICD-10-CM | POA: Diagnosis present

## 2024-05-17 DIAGNOSIS — Z992 Dependence on renal dialysis: Secondary | ICD-10-CM

## 2024-05-17 DIAGNOSIS — I120 Hypertensive chronic kidney disease with stage 5 chronic kidney disease or end stage renal disease: Secondary | ICD-10-CM | POA: Diagnosis present

## 2024-05-17 DIAGNOSIS — M159 Polyosteoarthritis, unspecified: Secondary | ICD-10-CM | POA: Diagnosis present

## 2024-05-17 DIAGNOSIS — Z9049 Acquired absence of other specified parts of digestive tract: Secondary | ICD-10-CM

## 2024-05-17 DIAGNOSIS — Z96653 Presence of artificial knee joint, bilateral: Secondary | ICD-10-CM | POA: Diagnosis present

## 2024-05-17 DIAGNOSIS — Z9884 Bariatric surgery status: Secondary | ICD-10-CM

## 2024-05-17 DIAGNOSIS — Z888 Allergy status to other drugs, medicaments and biological substances status: Secondary | ICD-10-CM

## 2024-05-17 DIAGNOSIS — N2581 Secondary hyperparathyroidism of renal origin: Secondary | ICD-10-CM | POA: Diagnosis present

## 2024-05-17 DIAGNOSIS — M62838 Other muscle spasm: Secondary | ICD-10-CM | POA: Diagnosis not present

## 2024-05-17 DIAGNOSIS — Z833 Family history of diabetes mellitus: Secondary | ICD-10-CM

## 2024-05-17 DIAGNOSIS — Z79899 Other long term (current) drug therapy: Secondary | ICD-10-CM

## 2024-05-17 DIAGNOSIS — K219 Gastro-esophageal reflux disease without esophagitis: Secondary | ICD-10-CM | POA: Diagnosis present

## 2024-05-17 DIAGNOSIS — E669 Obesity, unspecified: Secondary | ICD-10-CM | POA: Diagnosis present

## 2024-05-17 DIAGNOSIS — G4733 Obstructive sleep apnea (adult) (pediatric): Secondary | ICD-10-CM | POA: Diagnosis present

## 2024-05-17 DIAGNOSIS — Z885 Allergy status to narcotic agent status: Secondary | ICD-10-CM

## 2024-05-17 HISTORY — PX: PARTIAL KNEE ARTHROPLASTY: SHX2174

## 2024-05-17 LAB — POCT I-STAT, CHEM 8
BUN: 18 mg/dL (ref 6–20)
Calcium, Ion: 0.95 mmol/L — ABNORMAL LOW (ref 1.15–1.40)
Chloride: 100 mmol/L (ref 98–111)
Creatinine, Ser: 6.6 mg/dL — ABNORMAL HIGH (ref 0.61–1.24)
Glucose, Bld: 71 mg/dL (ref 70–99)
HCT: 35 % — ABNORMAL LOW (ref 39.0–52.0)
Hemoglobin: 11.9 g/dL — ABNORMAL LOW (ref 13.0–17.0)
Potassium: 4.4 mmol/L (ref 3.5–5.1)
Sodium: 137 mmol/L (ref 135–145)
TCO2: 28 mmol/L (ref 22–32)

## 2024-05-17 SURGERY — ARTHROPLASTY, KNEE, UNICOMPARTMENTAL
Anesthesia: Regional | Site: Knee | Laterality: Left

## 2024-05-17 MED ORDER — SUGAMMADEX SODIUM 200 MG/2ML IV SOLN
INTRAVENOUS | Status: DC | PRN
  Administered 2024-05-17: 200 mg via INTRAVENOUS

## 2024-05-17 MED ORDER — PROPOFOL 10 MG/ML IV BOLUS
INTRAVENOUS | Status: AC
  Filled 2024-05-17: qty 20

## 2024-05-17 MED ORDER — FENTANYL CITRATE (PF) 100 MCG/2ML IJ SOLN: 50.0000 ug | INTRAMUSCULAR | Status: DC

## 2024-05-17 MED ORDER — OXYCODONE HCL 5 MG PO TABS: 5.0000 mg | ORAL_TABLET | ORAL | 0 refills | Status: DC | PRN

## 2024-05-17 MED ORDER — APIXABAN 2.5 MG PO TABS: 2.5000 mg | ORAL_TABLET | Freq: Two times a day (BID) | ORAL | 0 refills | Status: AC

## 2024-05-17 MED ORDER — SODIUM CHLORIDE 0.9 % IR SOLN
Status: DC | PRN
  Administered 2024-05-17: 3000 mL

## 2024-05-17 MED ORDER — POLYETHYLENE GLYCOL 3350 17 G PO PACK: 17.0000 g | PACK | Freq: Every day | ORAL | Status: DC | PRN

## 2024-05-17 MED ORDER — METHOCARBAMOL 500 MG PO TABS
500.0000 mg | ORAL_TABLET | Freq: Four times a day (QID) | ORAL | Status: DC | PRN
  Administered 2024-05-18 – 2024-05-21 (×11): 500 mg via ORAL
  Filled 2024-05-17 (×10): qty 1

## 2024-05-17 MED ORDER — BUPIVACAINE LIPOSOME 1.3 % IJ SUSP
INTRAMUSCULAR | Status: DC | PRN
  Administered 2024-05-17: 50 mL

## 2024-05-17 MED ORDER — ACETAMINOPHEN 500 MG PO TABS: 1000.0000 mg | ORAL_TABLET | Freq: Three times a day (TID) | ORAL | Status: AC | PRN

## 2024-05-17 MED ORDER — MENTHOL 3 MG MT LOZG: 1.0000 | LOZENGE | OROMUCOSAL | Status: DC | PRN

## 2024-05-17 MED ORDER — FENTANYL CITRATE (PF) 100 MCG/2ML IJ SOLN
INTRAMUSCULAR | Status: AC
  Filled 2024-05-17: qty 2

## 2024-05-17 MED ORDER — ACETAMINOPHEN 500 MG PO TABS
1000.0000 mg | ORAL_TABLET | Freq: Four times a day (QID) | ORAL | Status: AC
  Administered 2024-05-17 – 2024-05-18 (×3): 1000 mg via ORAL
  Filled 2024-05-17 (×3): qty 2

## 2024-05-17 MED ORDER — MORPHINE SULFATE (PF) 4 MG/ML IV SOLN
2.0000 mg | INTRAVENOUS | Status: DC | PRN
  Administered 2024-05-17 – 2024-05-21 (×11): 4 mg via INTRAVENOUS
  Filled 2024-05-17 (×10): qty 1

## 2024-05-17 MED ORDER — PHENOL 1.4 % MT LIQD: 1.0000 | OROMUCOSAL | Status: DC | PRN

## 2024-05-17 MED ORDER — POLYETHYLENE GLYCOL 3350 17 G PO PACK: 17.0000 g | PACK | Freq: Every day | ORAL | 0 refills | Status: AC

## 2024-05-17 MED ORDER — DIPHENHYDRAMINE HCL 12.5 MG/5ML PO ELIX: 12.5000 mg | ORAL_SOLUTION | ORAL | Status: DC | PRN

## 2024-05-17 MED ORDER — ONDANSETRON HCL 4 MG/2ML IJ SOLN
INTRAMUSCULAR | Status: DC | PRN
  Administered 2024-05-17: 4 mg via INTRAVENOUS

## 2024-05-17 MED ORDER — POVIDONE-IODINE 10 % EX SWAB
2.0000 | Freq: Once | CUTANEOUS | Status: AC
  Administered 2024-05-17: 2 via TOPICAL

## 2024-05-17 MED ORDER — CEFAZOLIN SODIUM-DEXTROSE 2-4 GM/100ML-% IV SOLN
2.0000 g | Freq: Four times a day (QID) | INTRAVENOUS | Status: DC
  Administered 2024-05-17: 2 g via INTRAVENOUS
  Filled 2024-05-17: qty 100

## 2024-05-17 MED ORDER — ONDANSETRON HCL 4 MG/2ML IJ SOLN: 4.0000 mg | Freq: Once | INTRAMUSCULAR | Status: DC | PRN

## 2024-05-17 MED ORDER — TRANEXAMIC ACID-NACL 1000-0.7 MG/100ML-% IV SOLN
1000.0000 mg | INTRAVENOUS | Status: AC
Start: 2024-05-17 — End: 2024-05-17
  Administered 2024-05-17: 1000 mg via INTRAVENOUS
  Filled 2024-05-17: qty 100

## 2024-05-17 MED ORDER — LIDOCAINE 2% (20 MG/ML) 5 ML SYRINGE
INTRAMUSCULAR | Status: DC | PRN
Start: 2024-05-17 — End: 2024-05-17
  Administered 2024-05-17: 40 mg via INTRAVENOUS

## 2024-05-17 MED ORDER — LACTATED RINGERS IV SOLN: INTRAVENOUS | Status: DC

## 2024-05-17 MED ORDER — ONDANSETRON HCL 4 MG/2ML IJ SOLN: 4.0000 mg | Freq: Four times a day (QID) | INTRAMUSCULAR | Status: DC | PRN

## 2024-05-17 MED ORDER — SODIUM CHLORIDE 0.9 % IR SOLN: Status: DC | PRN

## 2024-05-17 MED ORDER — MEPERIDINE HCL 25 MG/ML IJ SOLN: 6.2500 mg | INTRAMUSCULAR | Status: DC | PRN

## 2024-05-17 MED ORDER — PROPOFOL 10 MG/ML IV BOLUS
INTRAVENOUS | Status: DC | PRN
  Administered 2024-05-17: 150 ug/kg/min via INTRAVENOUS
  Administered 2024-05-17: 120 mg via INTRAVENOUS

## 2024-05-17 MED ORDER — HYDROMORPHONE HCL 1 MG/ML IJ SOLN
0.5000 mg | INTRAMUSCULAR | Status: DC | PRN
  Filled 2024-05-17: qty 0.5

## 2024-05-17 MED ORDER — FENTANYL CITRATE (PF) 100 MCG/2ML IJ SOLN
INTRAMUSCULAR | Status: AC
  Administered 2024-05-17: 100 ug via INTRAVENOUS
  Filled 2024-05-17: qty 2

## 2024-05-17 MED ORDER — BUPIVACAINE LIPOSOME 1.3 % IJ SUSP: 20.0000 mL | Freq: Once | INTRAMUSCULAR | Status: DC

## 2024-05-17 MED ORDER — METHOCARBAMOL 1000 MG/10ML IJ SOLN: 500.0000 mg | Freq: Four times a day (QID) | INTRAMUSCULAR | Status: DC | PRN

## 2024-05-17 MED ORDER — MIDAZOLAM HCL 2 MG/2ML IJ SOLN
INTRAMUSCULAR | Status: DC | PRN
  Administered 2024-05-17 (×2): 1 mg via INTRAVENOUS

## 2024-05-17 MED ORDER — KETAMINE HCL 50 MG/5ML IJ SOSY
PREFILLED_SYRINGE | INTRAMUSCULAR | Status: AC
  Filled 2024-05-17: qty 5

## 2024-05-17 MED ORDER — FENTANYL CITRATE (PF) 250 MCG/5ML IJ SOLN
INTRAMUSCULAR | Status: AC
  Filled 2024-05-17: qty 5

## 2024-05-17 MED ORDER — CEFAZOLIN SODIUM-DEXTROSE 2-4 GM/100ML-% IV SOLN
2.0000 g | INTRAVENOUS | Status: AC
  Administered 2024-05-17: 2 g via INTRAVENOUS
  Filled 2024-05-17: qty 100

## 2024-05-17 MED ORDER — MIDAZOLAM HCL 2 MG/2ML IJ SOLN
INTRAMUSCULAR | Status: AC
  Filled 2024-05-17: qty 2

## 2024-05-17 MED ORDER — METHOCARBAMOL 500 MG PO TABS: 500.0000 mg | ORAL_TABLET | Freq: Three times a day (TID) | ORAL | 0 refills | Status: DC | PRN

## 2024-05-17 MED ORDER — DOCUSATE SODIUM 100 MG PO CAPS
100.0000 mg | ORAL_CAPSULE | Freq: Two times a day (BID) | ORAL | Status: DC
  Administered 2024-05-17 – 2024-05-22 (×10): 100 mg via ORAL
  Filled 2024-05-17 (×11): qty 1

## 2024-05-17 MED ORDER — OXYCODONE HCL 5 MG PO TABS: 5.0000 mg | ORAL_TABLET | Freq: Once | ORAL | Status: DC | PRN

## 2024-05-17 MED ORDER — DEXMEDETOMIDINE HCL IN NACL 80 MCG/20ML IV SOLN
INTRAVENOUS | Status: DC | PRN
  Administered 2024-05-17: 12 ug via INTRAVENOUS
  Administered 2024-05-17: 4 ug via INTRAVENOUS

## 2024-05-17 MED ORDER — CHLORHEXIDINE GLUCONATE 0.12 % MT SOLN
15.0000 mL | Freq: Once | OROMUCOSAL | Status: AC
  Administered 2024-05-17: 15 mL via OROMUCOSAL
  Filled 2024-05-17: qty 15

## 2024-05-17 MED ORDER — ONDANSETRON HCL 4 MG PO TABS: 4.0000 mg | ORAL_TABLET | Freq: Three times a day (TID) | ORAL | 0 refills | Status: AC | PRN

## 2024-05-17 MED ORDER — BUPIVACAINE HCL 0.5 % IJ SOLN
INTRAMUSCULAR | Status: DC | PRN
  Administered 2024-05-17: 20 mL

## 2024-05-17 MED ORDER — MIDAZOLAM HCL 2 MG/2ML IJ SOLN: 1.0000 mg | INTRAMUSCULAR | Status: DC

## 2024-05-17 MED ORDER — OXYCODONE HCL 5 MG PO TABS
5.0000 mg | ORAL_TABLET | ORAL | Status: DC | PRN
  Administered 2024-05-17 – 2024-05-18 (×3): 10 mg via ORAL
  Filled 2024-05-17 (×3): qty 2

## 2024-05-17 MED ORDER — APIXABAN 2.5 MG PO TABS
2.5000 mg | ORAL_TABLET | Freq: Two times a day (BID) | ORAL | Status: DC
  Administered 2024-05-18 – 2024-05-22 (×9): 2.5 mg via ORAL
  Filled 2024-05-17 (×9): qty 1

## 2024-05-17 MED ORDER — SODIUM CHLORIDE 0.9 % IV SOLN: INTRAVENOUS | Status: DC

## 2024-05-17 MED ORDER — ACETAMINOPHEN 325 MG PO TABS: 325.0000 mg | ORAL_TABLET | Freq: Four times a day (QID) | ORAL | Status: DC | PRN

## 2024-05-17 MED ORDER — ROCURONIUM BROMIDE 10 MG/ML (PF) SYRINGE
PREFILLED_SYRINGE | INTRAVENOUS | Status: DC | PRN
  Administered 2024-05-17: 50 mg via INTRAVENOUS
  Administered 2024-05-17: 30 mg via INTRAVENOUS
  Administered 2024-05-17: 20 mg via INTRAVENOUS

## 2024-05-17 MED ORDER — ORAL CARE MOUTH RINSE: 15.0000 mL | Freq: Once | OROMUCOSAL | Status: AC

## 2024-05-17 MED ORDER — PANTOPRAZOLE SODIUM 40 MG PO TBEC
40.0000 mg | DELAYED_RELEASE_TABLET | Freq: Every day | ORAL | Status: DC
  Administered 2024-05-17 – 2024-05-22 (×6): 40 mg via ORAL
  Filled 2024-05-17 (×6): qty 1

## 2024-05-17 MED ORDER — 0.9 % SODIUM CHLORIDE (POUR BTL) OPTIME
TOPICAL | Status: DC | PRN
Start: 2024-05-17 — End: 2024-05-17
  Administered 2024-05-17: 1000 mL

## 2024-05-17 MED ORDER — FENTANYL CITRATE (PF) 100 MCG/2ML IJ SOLN: 50.0000 ug | Freq: Once | INTRAMUSCULAR | Status: DC

## 2024-05-17 MED ORDER — OXYCODONE HCL 5 MG/5ML PO SOLN: 5.0000 mg | Freq: Once | ORAL | Status: DC | PRN

## 2024-05-17 MED ORDER — ONDANSETRON HCL 4 MG PO TABS: 4.0000 mg | ORAL_TABLET | Freq: Four times a day (QID) | ORAL | Status: DC | PRN

## 2024-05-17 MED ORDER — FENTANYL CITRATE (PF) 100 MCG/2ML IJ SOLN
25.0000 ug | INTRAMUSCULAR | Status: DC | PRN
  Administered 2024-05-17 (×3): 50 ug via INTRAVENOUS

## 2024-05-17 MED ORDER — FENTANYL CITRATE (PF) 250 MCG/5ML IJ SOLN
INTRAMUSCULAR | Status: DC | PRN
  Administered 2024-05-17 (×4): 50 ug via INTRAVENOUS
  Administered 2024-05-17 (×2): 100 ug via INTRAVENOUS
  Administered 2024-05-17: 50 ug via INTRAVENOUS

## 2024-05-17 MED ORDER — DEXAMETHASONE SOD PHOSPHATE PF 10 MG/ML IJ SOLN
INTRAMUSCULAR | Status: DC | PRN
  Administered 2024-05-17: 10 mg via INTRAVENOUS

## 2024-05-17 MED ORDER — MIDAZOLAM HCL 2 MG/2ML IJ SOLN
INTRAMUSCULAR | Status: AC
  Administered 2024-05-17: 2 mg via INTRAVENOUS
  Filled 2024-05-17: qty 2

## 2024-05-17 MED ORDER — KETAMINE HCL 10 MG/ML IJ SOLN
INTRAMUSCULAR | Status: DC | PRN
  Administered 2024-05-17: 35 mg via INTRAVENOUS

## 2024-05-17 MED ORDER — ACETAMINOPHEN 500 MG PO TABS
1000.0000 mg | ORAL_TABLET | Freq: Once | ORAL | Status: DC
  Filled 2024-05-17: qty 2

## 2024-05-17 SURGICAL SUPPLY — 49 items
BAG COUNTER SPONGE SURGICOUNT (BAG) ×1 IMPLANT
BASEPLATE TIB UNI JOUR 2 LT (Plate) IMPLANT
BLADE SAW RECIP 87.9 MT (BLADE) IMPLANT
BLADE SAW SGTL 13X75X1.27 (BLADE) IMPLANT
BLADE SURG 15 STRL LF DISP TIS (BLADE) ×1 IMPLANT
BNDG COMPR ESMARK 6X3 LF (GAUZE/BANDAGES/DRESSINGS) IMPLANT
BNDG ELASTIC 6X10 VLCR STRL LF (GAUZE/BANDAGES/DRESSINGS) ×1 IMPLANT
BNDG ELASTIC 6X15 VLCR STRL LF (GAUZE/BANDAGES/DRESSINGS) IMPLANT
BOWL SMART MIX CTS (DISPOSABLE) ×1 IMPLANT
CEMENT BONE R 1X40 (Cement) IMPLANT
CHLORAPREP W/TINT 26 (MISCELLANEOUS) ×1 IMPLANT
COMPONENT FEM UNI JOUR 4 LM/RL (Joint) IMPLANT
COVER SURGICAL LIGHT HANDLE (MISCELLANEOUS) ×1 IMPLANT
CUFF TRNQT CYL 34X4.125X (TOURNIQUET CUFF) ×1 IMPLANT
DERMABOND ADVANCED .7 DNX12 (GAUZE/BANDAGES/DRESSINGS) ×1 IMPLANT
DRAPE HALF SHEET 40X57 (DRAPES) IMPLANT
DRAPE INCISE IOBAN 66X45 STRL (DRAPES) ×1 IMPLANT
DRAPE POUCH INSTRU U-SHP 10X18 (DRAPES) IMPLANT
DRAPE U-SHAPE 47X51 STRL (DRAPES) ×1 IMPLANT
DRSG AQUACEL AG ADV 3.5X 6 (GAUZE/BANDAGES/DRESSINGS) ×1 IMPLANT
DRSG AQUACEL AG ADV 3.5X10 (GAUZE/BANDAGES/DRESSINGS) IMPLANT
ELECT REM PT RETURN 15FT ADLT (MISCELLANEOUS) ×1 IMPLANT
GLOVE BIO SURGEON STRL SZ7.5 (GLOVE) ×2 IMPLANT
GLOVE BIOGEL PI IND STRL 7.5 (GLOVE) ×1 IMPLANT
GLOVE BIOGEL PI IND STRL 8 (GLOVE) ×1 IMPLANT
GLOVE SURG ENC MOIS LTX SZ8 (GLOVE) ×2 IMPLANT
GOWN STRL REUS W/ TWL LRG LVL3 (GOWN DISPOSABLE) IMPLANT
GOWN STRL REUS W/ TWL XL LVL3 (GOWN DISPOSABLE) ×1 IMPLANT
HOOD PEEL AWAY T7 (MISCELLANEOUS) IMPLANT
KIT TURNOVER KIT B (KITS) IMPLANT
LINER TIB UNI JOUR 4-5 8 (Liner) IMPLANT
MANIFOLD NEPTUNE II (INSTRUMENTS) ×1 IMPLANT
NDL SPNL 18GX3.5 QUINCKE PK (NEEDLE) IMPLANT
NEEDLE SPNL 18GX3.5 QUINCKE PK (NEEDLE) ×1 IMPLANT
PACK TOTAL JOINT (CUSTOM PROCEDURE TRAY) IMPLANT
PIN FIX RIM MIS 3.2X30 STL (PIN) IMPLANT
PIN SPEED NONRIM 65 STERL KNEE (PIN) IMPLANT
SET HNDPC FAN SPRY TIP SCT (DISPOSABLE) ×1 IMPLANT
SOLN 0.9% NACL 1000 ML (IV SOLUTION) ×1 IMPLANT
SOLN 0.9% NACL POUR BTL 1000ML (IV SOLUTION) ×1 IMPLANT
SUT MNCRL AB 3-0 PS2 27 (SUTURE) ×1 IMPLANT
SUT VIC AB 0 CT1 36 (SUTURE) ×1 IMPLANT
SUT VIC AB 1 CT1 36 (SUTURE) ×1 IMPLANT
SUT VIC AB 2-0 CT1 TAPERPNT 27 (SUTURE) ×2 IMPLANT
SUTURE STRATFX 0 PDS 27 VIOLET (SUTURE) ×1 IMPLANT
SUTURE STRATFX PDO 1 14 VIOLET (SUTURE) ×1 IMPLANT
SYR 30ML LL (SYRINGE) IMPLANT
TRAY FOLEY MTR SLVR 16FR STAT (SET/KITS/TRAYS/PACK) ×1 IMPLANT
WRAP KNEE MAXI GEL POST OP (GAUZE/BANDAGES/DRESSINGS) ×1 IMPLANT

## 2024-05-17 NOTE — Telephone Encounter (Signed)
 Pharmacy Patient Advocate Encounter  Insurance verification completed.    The patient is insured through [facility]. Patient has Medicare and is not eligible for a copay card, but may be able to apply for patient assistance or Medicare RX Payment Plan (Patient Must reach out to their plan, if eligible for payment plan), if available.    Ran test claim for Eliquis 5mg  and the current 30 day co-pay is $0.   This test claim was processed through [facility]- copay amounts may vary at other pharmacies due to [employer], or as the patient moves through the different stages of their insurance plan.

## 2024-05-17 NOTE — Anesthesia Postprocedure Evaluation (Signed)
 Anesthesia Post Note  Patient: SLAYTON LUBITZ  Procedure(s) Performed: ARTHROPLASTY, KNEE, UNICOMPARTMENTAL (Left: Knee)     Patient location during evaluation: PACU Anesthesia Type: Regional and General Level of consciousness: awake and alert Pain management: pain level controlled Vital Signs Assessment: post-procedure vital signs reviewed and stable Respiratory status: spontaneous breathing, nonlabored ventilation, respiratory function stable and patient connected to nasal cannula oxygen Cardiovascular status: blood pressure returned to baseline and stable Postop Assessment: no apparent nausea or vomiting Anesthetic complications: no   No notable events documented.  Last Vitals:  Vitals:   05/17/24 1507 05/17/24 1954  BP: 132/73 (!) 153/79  Pulse: (!) 59 (!) 59  Resp: 18   Temp: 36.6 C 36.6 C  SpO2: 100% 100%    Last Pain:  Vitals:   05/17/24 2150  TempSrc:   PainSc: 6    Pain Goal: Patients Stated Pain Goal: 3 (05/17/24 9075)                 Kriya Westra

## 2024-05-17 NOTE — Transfer of Care (Signed)
 Immediate Anesthesia Transfer of Care Note  Patient: [name]  Procedure(s) Performed: ARTHROPLASTY, KNEE, UNICOMPARTMENTAL (Left: Knee)  Patient Location: PACU  Anesthesia Type:General  Level of Consciousness: awake, oriented, and drowsy  Airway & Oxygen Therapy: Patient Spontanous Breathing  Post-op Assessment: Report given to RN, Post -op Vital signs reviewed and stable, and Patient moving all extremities  Post vital signs: Reviewed and stable  Last Vitals:  Vitals Value Taken Time  BP 131/69 05/17/24 14:02  Temp 36.2 C 05/17/24 14:02  Pulse 72 05/17/24 14:06  Resp 16 05/17/24 14:06  SpO2 95 % 05/17/24 14:06  Vitals shown include unfiled device data.  Last Pain:  Vitals:   05/17/24 0924  TempSrc:   PainSc: 6       Patients Stated Pain Goal: 3 (05/17/24 0924)  Complications: No notable events documented.

## 2024-05-17 NOTE — Anesthesia Procedure Notes (Signed)
 Anesthesia Regional Block: Adductor canal block   Pre-Anesthetic Checklist: , timeout performed,  Correct Patient, Correct Site, Correct Laterality,  Correct Procedure, Correct Position, site marked,  Risks and benefits discussed,  Surgical consent,  Pre-op evaluation,  At surgeon's request and post-op pain management  Laterality: Left  Prep: chloraprep       Needles:  Injection technique: Single-shot  Needle Type: Echogenic Stimulator Needle     Needle Length: 5cm  Needle Gauge: 22     Additional Needles:   Procedures:, nerve stimulator,,, ultrasound used (permanent image in chart),,     Nerve Stimulator or Paresthesia:  Response: =, 0.45 mA  Additional Responses:   Narrative:  Start time: 05/17/2024 10:10 AM End time: 05/17/2024 10:16 AM Injection made incrementally with aspirations every 5 mL.  Performed by: Personally  Anesthesiologist: Mallory Manus, MD  Additional Notes: Functioning IV was confirmed and monitors were applied.  A 50mm 22ga Arrow echogenic stimulator needle was used. Sterile prep and drape,hand hygiene and sterile gloves were used. Ultrasound guidance: relevant anatomy identified, needle position confirmed, local anesthetic spread visualized around nerve(s)., vascular puncture avoided.  Image printed for medical record. Negative aspiration and negative test dose prior to incremental administration of local anesthetic. The patient tolerated the procedure well.

## 2024-05-17 NOTE — Anesthesia Procedure Notes (Signed)
 Procedure Name: Intubation Date/Time: 05/17/2024 11:18 AM  Performed by: Jerl Donald LABOR, CRNAPre-anesthesia Checklist: Patient identified, Emergency Drugs available, Suction available and Patient being monitored Patient Re-evaluated:Patient Re-evaluated prior to induction Oxygen Delivery Method: Circle System Utilized Preoxygenation: Pre-oxygenation with 100% oxygen Induction Type: IV induction Ventilation: Mask ventilation without difficulty Laryngoscope Size: Mac and 3 Grade View: Grade II Tube type: Oral Tube size: 7.5 mm Number of attempts: 1 Airway Equipment and Method: Stylet Placement Confirmation: ETT inserted through vocal cords under direct vision, positive ETCO2 and breath sounds checked- equal and bilateral Secured at: 22 cm Tube secured with: Tape Dental Injury: Teeth and Oropharynx as per pre-operative assessment

## 2024-05-17 NOTE — Interval H&P Note (Signed)
 The patient has been re-examined, and the chart reviewed, and there have been no interval changes to the documented history and physical.     Plan for left lateral UKA vs TKA for lateral compartment OA  The operative side was examined and the patient was confirmed to have sensation to DPN, SPN, TN intact, Motor EHL, ext, flex 5/5, and DP 2+, PT 2+, No significant edema.   The risks, benefits, and alternatives have been discussed at length with patient, and the patient is willing to proceed.  Left knee marked. Consent has been signed.

## 2024-05-17 NOTE — Op Note (Signed)
 05/17/2024  1:35 PM  PATIENT:  [name]    PRE-OPERATIVE DIAGNOSIS: End-stage lateral compartment left knee osteoarthritis  POST-OPERATIVE DIAGNOSIS:  Same  PROCEDURE: Left lateral unicompartmental Knee Arthroplasty  SURGEON:  Ira Dougher A Catina Nuss, MD  PHYSICIAN ASSISTANT: Bernarda Mclean, [name], present and scrubbed throughout the case, critical for completion in a timely fashion, and for retraction, instrumentation, and closure.  ANESTHESIA:   general  ESTIMATED BLOOD LOSS: 50cc  PREOPERATIVE INDICATIONS:  [name] is a  38 y.o. male with a diagnosis of OSTEOARTHRITIS LEFT KNEE who failed conservative measures and elected for surgical management.    The risks benefits and alternatives were discussed with the patient preoperatively including but not limited to the risks of infection, bleeding, nerve injury, cardiopulmonary complications, blood clots, the need for revision surgery, among others, and the patient was willing to proceed.  OPERATIVE IMPLANTS: [employer] fixed bearing lateral compartment arthroplasty femur size 4, tibia size 2, bearing size 12mm.  OPERATIVE FINDINGS:  Advanced lateral compartment bone-on-bone arthritis, preserved medial patellofemoral joint spaces intact cruciate ligaments.  OPERATIVE PROCEDURE:   Once adequate anesthesia, preoperative antibiotics, 2 gm of ancef ,1 gm of Tranexamic Acid, and 8 mg of Decadron  administered, the patient was positioned supine with a left thigh tourniquet placed.  The left lower extremity was prepped and draped in sterile fashion.  A time-  out was performed identifying the patient, planned procedure, and the appropriate extremity.   The leg was elevated and exsanguinated and the tourniquet was inflated. Anterior midline incision was performed. Lateral Parapatellar incision was carried out, and the osteophytes were excised, along with the medial plateau and femoral condyle. Resected anterior horn of lateral  meniscus and a small portion of the fat pad. Lateral release was performed.  Remainder of the knee was evaluated.  ACL was intact medial compartment with no significant wear.  Patellofemoral compartment with mild wear.  The extra medullary tibial cutting jig was applied, and resection was performed measuring 2 mm from the lateral tibial defect.  Cut was made perpendicular to the axis of the tibia, matching the patient's native slope, and sagittal cut was made with appropriate rotation and just  lateral to the ACL insertion.    The proximal tibial bony cut was removed in one piece, and I turned my attention to the femur.  Rasp was used to clear debris from the corner of the cut.  We next turned our attention to the femur.  Distal femoral cutting block was put in place and pinned and the distal femur was cut. We used -2mm cut to distalize the distal femur 2mm.  The cutting guide was removed and the cut was completed with the knee in flexion.  We then sized the femur to be a 4 and then pinned the femur cutting guide into place taking care to appropriately lateralized and not overhang the component, medially or anteriorly.  The femur was drilled and the posterior and chamfer cuts were made.  With the knee in flexion lateral meniscectomy was performed.  We next size of the tibia and found it to be a size 2.  The tibial trial was then impacted into place in the right position pinned and the lugs were drilled.  The trial femoral component was then impacted, and a size 12mm liner trial was inserted.  With the trial liner in place we used the Amber sticks and had approximately 2-3 mm of laxity in extension and 2 to 3 mm of laxity in  flexion.  The femur also tracked appropriately on the center of the tibial component through flexion.   I then cemented the components into place, cementing the tibia first, removing all excess cement, and then cementing the femur.  All loose cement was removed.  The trial  liner was again inserted until cement had completely set.  The wounds were thoroughly irrigated with normal saline pulse lavage and injected with 20cc Exparel  diluted with 30cc 0.25% marcaine  with epi.  Once the cement was set again we assessed stability and balance which we felt was appropriate with a size 12 mm liner.  The real polyethylene liner was opened and inserted.   The tourniquet was let down  No significant   hemostasis was required.  The medial parapatellar arthrotomy was then reapproximated using #1 Vicryl and #1 Stratafix sutures with the knee  in flexion.  The   remaining wound was closed with 0 stratafix, 2-0 Vicryl, and running 3-0 Monocryl.   The knee was cleaned, dried, dressed sterilely using Dermabond and   Aquacel dressing.  The patient was then  brought to recovery room in stable condition, tolerating the procedure  well. There were no complications.   Post op recs: WB: WBAT Abx: ancef  periop Imaging: PACU xrays DVT prophylaxis: Eliquis twice daily starting postop day 1 for DVT prophylaxis follow up: 2 Dragos after surgery for a wound check with Dr. Edna at [facility].  Address: [address], [place], [place] [address]  Office Phone: [phone]  Toribio Edna, MD Orthopaedic Surgery

## 2024-05-17 NOTE — Evaluation (Signed)
 Physical Therapy Evaluation Patient Details Name: [name] MRN: [id number] DOB: 02/15/1986 Today's Date: 05/17/2024  History of Present Illness  Pt is a 38 y.o. male who presented 05/16/24 for elective L lateral unicompartmental knee arthroplasty. PMH: aortic valve disease, arthritis, ESRD, GERD, hyperparathyroidism, HTN, sleep apnea, morbid obesity  Clinical Impression  Pt presents with condition above and deficits mentioned below, see PT Problem List. PTA, he was mod I using a SPC for the past x3 years due to knee pain, working, and living with his sister in a 1-level house with a ramped entrance. Currently, the pt is displaying limitations in L knee extension/flexion ROM, L knee strength, and L leg weight acceptance due to pain, expected following knee arthroplasty. He was in the habit of walking on his L forefoot prior to surgery and needed repeated cues to get his L foot flat when ambulating this date. He is currently requiring minA for bed mobility, minA for transfers, and minA quickly progressing to CGA for safety when ambulating with a RW. He will likely progress well quickly. Follow physician's recommendations for discharge plan and follow up therapies. Will continue to follow acutely.        If plan is discharge home, recommend the following: A little help with walking and/or transfers;A little help with bathing/dressing/bathroom;Assistance with cooking/housework;Assist for transportation;Help with stairs or ramp for entrance   Can travel by private vehicle        Equipment Recommendations Other (comment) (tub bench/shower chair)  Recommendations for Other Services  OT consult    Functional Status Assessment Patient has had a recent decline in their functional status and demonstrates the ability to make significant improvements in function in a reasonable and predictable amount of time.     Precautions / Restrictions Precautions Precautions: Fall Restrictions Weight  Bearing Restrictions Per Provider Order: Yes LLE Weight Bearing Per Provider Order: Weight bearing as tolerated      Mobility  Bed Mobility Overal bed mobility: Needs Assistance Bed Mobility: Supine to Sit     Supine to sit: Min assist, HOB elevated     General bed mobility comments: Extra time and minA needed at L leg to assist off EOB and support it while being lowered to ground when transitioning supine to sit L EOB.    Transfers Overall transfer level: Needs assistance Equipment used: Rolling walker (2 wheels) Transfers: Sit to/from Stand Sit to Stand: Min assist           General transfer comment: MinA needed to power up to stand and gain balance.    Ambulation/Gait Ambulation/Gait assistance: Min assist, Contact guard assist Gait Distance (Feet): 140 Feet Assistive device: Rolling walker (2 wheels) Gait Pattern/deviations: Step-to pattern, Decreased stance time - left, Decreased dorsiflexion - left, Decreased stride length, Decreased weight shift to left, Antalgic, Trunk flexed Gait velocity: reduced Gait velocity interpretation: <1.31 ft/sec, indicative of household ambulator   General Gait Details: Pt ambulated initially with only forefoot contact on ground. Cued pt to kick L leg anteriorly more during swing phase and get L heel to ground initially then keep L heel on ground as he begins to weight bear through L foot. Pt would intermittently then push up on his forefoot with initiation of stance phase, needing reminders to keep foot flat on ground until towards the end of stance phase. MinA initially for balance but quickly progressed to CGA for safety, no LOB  Careers information officer  Tilt Bed    Modified Rankin (Stroke Patients Only)       Balance Overall balance assessment: Needs assistance Sitting-balance support: No upper extremity supported, Feet supported Sitting balance-Leahy Scale: Good     Standing balance support:  Bilateral upper extremity supported, During functional activity, Reliant on assistive device for balance Standing balance-Leahy Scale: Poor Standing balance comment: reliant on RW                             Pertinent Vitals/Pain Pain Assessment Pain Assessment: 0-10 Pain Score: 8  Pain Location: L knee Pain Descriptors / Indicators: Discomfort, Grimacing, Guarding, Operative site guarding, Sore Pain Intervention(s): Limited activity within patient's tolerance, Monitored during session, Repositioned, Patient requesting pain meds-RN notified, Ice applied    Home Living Family/patient expects to be discharged to:: Private residence Living Arrangements: Other relatives (sister) Available Help at Discharge: Family;Available 24 hours/day Type of Home: House Home Access: Ramped entrance       Home Layout: One level Home Equipment: Agricultural consultant (2 wheels);Cane - single point      Prior Function Prior Level of Function : Independent/Modified Independent;Driving;Working/employed             Mobility Comments: Has been using SPC majority of time for past 3 years ADLs Comments: Works as Conservation officer, 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     Extremity/Trunk Assessment   Upper Extremity Assessment Upper Extremity Assessment: Defer to OT evaluation    Lower Extremity Assessment Lower Extremity Assessment: LLE deficits/detail LLE Deficits / Details: Lacks ~15' knee extension and achieves ~50' flexion AAROM; reports numbness still at knee following surgery earlier today; grossly 4- to 4 strength LLE Sensation: decreased light touch (at knee)    Cervical / Trunk Assessment Cervical / Trunk Assessment: Normal  Communication   Communication Communication: No apparent difficulties    Cognition Arousal: Alert Behavior During Therapy: WFL for tasks assessed/performed   PT - Cognitive impairments: No apparent impairments                         Following commands: Intact        Cueing Cueing Techniques: Verbal cues     General Comments General comments (skin integrity, edema, etc.): Educated pt and girlfriend on positioning L knee in extension at rest without anything under the bend of the knee but rather inferior to the knee to encourage extension. They verbalized understanding.    Exercises     Assessment/Plan    PT Assessment Patient needs continued PT services  PT Problem List Decreased strength;Decreased range of motion;Decreased activity tolerance;Decreased balance;Decreased mobility;Pain;Impaired sensation       PT Treatment Interventions DME instruction;Gait training;Stair training;Functional mobility training;Therapeutic activities;Therapeutic exercise;Balance training;Neuromuscular re-education;Patient/family education    PT Goals (Current goals can be found in the Care Plan section)  Acute Rehab PT Goals Patient Stated Goal: to recover well PT Goal Formulation: With patient/family Time For Goal Achievement: 05/24/24 Potential to Achieve Goals: Good    Frequency 7X/week     Co-evaluation               AM-PAC PT 6 Clicks Mobility  Outcome Measure Help needed turning from your back to your side while in a flat bed without using bedrails?: A Little Help needed moving from lying on your back to sitting on the side of a flat bed without using bedrails?: A Little Help needed moving to and from a bed  to a chair (including a wheelchair)?: A Little Help needed standing up from a chair using your arms (e.g., wheelchair or bedside chair)?: A Little Help needed to walk in hospital room?: A Little Help needed climbing 3-5 steps with a railing? : A Lot 6 Click Score: 17    End of Session Equipment Utilized During Treatment: Gait belt Activity Tolerance: Patient tolerated treatment well Patient left: in chair;with call bell/phone within reach;with chair alarm set;with family/visitor present Nurse Communication: Mobility status;Patient  requests pain meds PT Visit Diagnosis: Unsteadiness on feet (R26.81);Other abnormalities of gait and mobility (R26.89);Muscle weakness (generalized) (M62.81);Difficulty in walking, not elsewhere classified (R26.2);Pain Pain - Right/Left: Left Pain - part of body: Knee    Time: 8356-8273 PT Time Calculation (min) (ACUTE ONLY): 43 min   Charges:   PT Evaluation $PT Eval Low Complexity: 1 Low PT Treatments $Gait Training: 8-22 mins $Therapeutic Activity: 8-22 mins PT General Charges $$ ACUTE PT VISIT: 1 Visit         Theo Ferretti, PT, DPT Acute Rehabilitation Services  Office: [phone]   Theo CHRISTELLA Ferretti 05/17/2024, 5:45 PM

## 2024-05-17 NOTE — Discharge Instructions (Addendum)
 INSTRUCTIONS AFTER JOINT REPLACEMENT   Remove items at home which could result in a fall. This includes throw rugs or furniture in walking pathways ICE to the affected joint every three hours while awake for 30 minutes at a time, for at least the first 3-5 days, and then as needed for pain and swelling.  Continue to use ice for pain and swelling. You may notice swelling that will progress down to the foot and ankle.  This is normal after surgery.  Elevate your leg when you are not up walking on it.   Continue to use the breathing machine you got in the hospital (incentive spirometer) which will help keep your temperature down.  It is common for your temperature to cycle up and down following surgery, especially at night when you are not up moving around and exerting yourself.  The breathing machine keeps your lungs expanded and your temperature down.  DIET:  As you were doing prior to hospitalization, we recommend a well-balanced diet.  DRESSING / WOUND CARE / SHOWERING:  Keep the surgical dressing until follow up.  The dressing is water  proof, so you can shower without any extra covering.  IF THE DRESSING FALLS OFF or the wound gets wet inside, change the dressing with sterile gauze.  Please use good hand washing techniques before changing the dressing.  Do not use any lotions or creams on the incision until instructed by your surgeon.    ACTIVITY  Increase activity slowly as tolerated, but follow the weight bearing instructions below.   No driving for 6 [place] or until further direction given by your physician.  You cannot drive while taking narcotics.  No lifting or carrying greater than 10 lbs. until further directed by your surgeon. Avoid periods of inactivity such as sitting longer than an hour when not asleep. This helps prevent blood clots.  You may return to work once you are authorized by your doctor.   WEIGHT BEARING: Weight bearing as tolerated with assist device (walker, cane, etc) as  directed, use it as long as suggested by your surgeon or therapist, typically at least 4-6 [place].  EXERCISES  Results after joint replacement surgery are often greatly improved when you follow the exercise, range of motion and muscle strengthening exercises prescribed by your doctor. Safety measures are also important to protect the joint from further injury. Any time any of these exercises cause you to have increased pain or swelling, decrease what you are doing until you are comfortable again and then slowly increase them. If you have problems or questions, call your caregiver or physical therapist for advice.   Rehabilitation is important following a joint replacement. After just a few days of immobilization, the muscles of the leg can become weakened and shrink (atrophy).  These exercises are designed to build up the tone and strength of the thigh and leg muscles and to improve motion. Often times heat used for twenty to thirty minutes before working out will loosen up your tissues and help with improving the range of motion but do not use heat for the first two [place] following surgery (sometimes heat can increase post-operative swelling).   These exercises can be done on a training (exercise) mat, on the floor, on a table or on a bed. Use whatever works the best and is most comfortable for you.    Use music or television while you are exercising so that the exercises are a pleasant break in your day. This will make your life  better with the exercises acting as a break in your routine that you can look forward to.   Perform all exercises about fifteen times, three times per day or as directed.  You should exercise both the operative leg and the other leg as well.  Exercises include:   Quad Sets - Tighten up the muscle on the front of the thigh (Quad) and hold for 5-10 seconds.   Straight Leg Raises - With your knee straight (if you were given a brace, keep it on), lift the leg to 60 degrees, hold  for 3 seconds, and slowly lower the leg.  Perform this exercise against resistance later as your leg gets stronger.  Leg Slides: Lying on your back, slowly slide your foot toward your buttocks, bending your knee up off the floor (only go as far as is comfortable). Then slowly slide your foot back down until your leg is flat on the floor again.  Angel Wings: Lying on your back spread your legs to the side as far apart as you can without causing discomfort.  Hamstring Strength:  Lying on your back, push your heel against the floor with your leg straight by tightening up the muscles of your buttocks.  Repeat, but this time bend your knee to a comfortable angle, and push your heel against the floor.  You may put a pillow under the heel to make it more comfortable if necessary.   A rehabilitation program following joint replacement surgery can speed recovery and prevent re-injury in the future due to weakened muscles. Contact your doctor or a physical therapist for more information on knee rehabilitation.   CONSTIPATION:  Constipation is defined medically as fewer than three stools per week and severe constipation as less than one stool per week.  Even if you have a regular bowel pattern at home, your normal regimen is likely to be disrupted due to multiple reasons following surgery.  Combination of anesthesia, postoperative narcotics, change in appetite and fluid intake all can affect your bowels.   YOU MUST use at least one of the following options; they are listed in order of increasing strength to get the job done.  They are all available over the counter, and you may need to use some, POSSIBLY even all of these options:    Drink plenty of fluids (prune juice may be helpful) and high fiber foods Colace 100 mg by mouth twice a day  Senokot for constipation as directed and as needed Dulcolax (bisacodyl), take with full glass of water   Miralax (polyethylene glycol) once or twice a day as needed.  If you  have tried all these things and are unable to have a bowel movement in the first 3-4 days after surgery call either your surgeon or your primary doctor.    If you experience loose stools or diarrhea, hold the medications until you stool forms back up.  If your symptoms do not get better within 1 week or if they get worse, check with your doctor.  If you experience the worst abdominal pain ever or develop nausea or vomiting, please contact the office immediately for further recommendations for treatment.  ITCHING:  If you experience itching with your medications, try taking only a single pain pill, or even half a pain pill at a time.  You can also use Benadryl  over the counter for itching or also to help with sleep.   TED HOSE STOCKINGS:  Use stockings on both legs until for at least 2 [place] or  as directed by physician office. They may be removed at night for sleeping.  MEDICATIONS:  See your medication summary on the "After Visit Summary" that nursing will review with you.  You may have some home medications which will be placed on hold until you complete the course of blood thinner medication.  It is important for you to complete the blood thinner medication as prescribed.  Blood clot prevention (DVT Prophylaxis): After surgery you are at an increased risk for a blood clot.  You were prescribed a blood thinner, Eliquis 2.5mg , to be taken twice daily for a total of 4 [place] from surgery to help reduce your risk of getting a blood clot.  Signs of a pulmonary embolus (blood clot in the lungs) include sudden short of breath, feeling lightheaded or dizzy, chest pain with a deep breath, rapid pulse rapid breathing.  Signs of a blood clot in your arms or legs include new unexplained swelling and cramping, warm, red or darkened skin around the painful area.  Please call the office or 911 right away if these signs or symptoms develop.  PRECAUTIONS:   If you experience chest pain or shortness of breath - call  911 immediately for transfer to the hospital emergency department.   If you develop a fever greater that 101 F, purulent drainage from wound, increased redness or drainage from wound, foul odor from the wound/dressing, or calf pain - CONTACT YOUR SURGEON.                                                   FOLLOW-UP APPOINTMENTS:  If you do not already have a post-op appointment, please call the office for an appointment to be seen by your surgeon.  Guidelines for how soon to be seen are listed in your "After Visit Summary", but are typically between 2-3 Majeed after surgery.  If you have a specialized bandage, you may be told to follow up 1 week after surgery.  OTHER INSTRUCTIONS:  Knee Replacement:  Do not place pillow under knee, focus on keeping the knee straight while resting.  Place foam block, curve side up under heel at all times except when walking.  DO NOT modify, tear, cut, or change the foam block in any way.  POST-OPERATIVE OPIOID TAPER INSTRUCTIONS: It is important to wean off of your opioid medication as soon as possible. If you do not need pain medication after your surgery it is ok to stop day one. Opioids include: Codeine, Hydrocodone (Norco, Vicodin), Oxycodone (Percocet , oxycontin ) and hydromorphone  amongst others.  Long term and even short term use of opiods can cause: Increased pain response Dependence Constipation Depression Respiratory depression And more.  Withdrawal symptoms can include Flu like symptoms Nausea, vomiting And more Techniques to manage these symptoms Hydrate well Eat regular healthy meals Stay active Use relaxation techniques(deep breathing, meditating, yoga) Do Not substitute Alcohol to help with tapering If you have been on opioids for less than two Gaeta and do not have pain than it is ok to stop all together.  Plan to wean off of opioids This plan should start within one week post op of your joint replacement. Maintain the same interval or time  between taking each dose and first decrease the dose.  Cut the total daily intake of opioids by one tablet each day Next start to increase the time between  doses. The last dose that should be eliminated is the evening dose.   MAKE SURE YOU:  Understand these instructions.  Get help right away if you are not doing well or get worse.    Thank you for letting us  be a part of your medical care team.  It is a privilege we respect greatly.  We hope these instructions will help you stay on track for a fast and full recovery!        ===========================================================  Information on my medicine - ELIQUIS (apixaban)  This medication education was reviewed with me or my healthcare representative as part of my discharge preparation.    Why was Eliquis prescribed for you? Eliquis was prescribed for you to reduce the risk of blood clots forming after orthopedic surgery.    What do You need to know about Eliquis? Take your Eliquis TWICE DAILY - one tablet in the morning and one tablet in the evening with or without food.  It would be best to take the dose about the same time each day.  If you have difficulty swallowing the tablet whole please discuss with your pharmacist how to take the medication safely.  Take Eliquis exactly as prescribed by your doctor and DO NOT stop taking Eliquis without talking to the doctor who prescribed the medication.  Stopping without other medication to take the place of Eliquis may increase your risk of developing a clot.  After discharge, you should have regular check-up appointments with your healthcare provider that is prescribing your Eliquis.  What do you do if you miss a dose? If a dose of ELIQUIS is not taken at the scheduled time, take it as soon as possible on the same day and twice-daily administration should be resumed.  The dose should not be doubled to make up for a missed dose.  Do not take more than one tablet of  ELIQUIS at the same time.  Important Safety Information A possible side effect of Eliquis is bleeding. You should call your healthcare provider right away if you experience any of the following: Bleeding from an injury or your nose that does not stop. Unusual colored urine (red or dark brown) or unusual colored stools (red or black). Unusual bruising for unknown reasons. A serious fall or if you hit your head (even if there is no bleeding).  Some medicines may interact with Eliquis and might increase your risk of bleeding or clotting while on Eliquis. To help avoid this, consult your healthcare provider or pharmacist prior to using any new prescription or non-prescription medications, including herbals, vitamins, non-steroidal anti-inflammatory drugs (NSAIDs) and supplements.  This website has more information on Eliquis (apixaban): http://www.eliquis.com/eliquis/home

## 2024-05-17 NOTE — Progress Notes (Signed)
 Orthopedic Tech Progress Note Patient Details:  DELFORD WINGERT [birth year] [id number]  Ortho Devices Type of Ortho Device: Bone foam zero knee Ortho Device/Splint Location: LLE Ortho Device/Splint Interventions: Ordered, Application, Adjustment   Post Interventions Patient Tolerated: Well Instructions Provided: Care of device  Delanna LITTIE Pac 05/17/2024, 2:47 PM

## 2024-05-18 MED ORDER — OXYCODONE HCL 5 MG PO TABS
10.0000 mg | ORAL_TABLET | ORAL | Status: DC | PRN
  Administered 2024-05-18 – 2024-05-22 (×16): 15 mg via ORAL
  Filled 2024-05-18 (×15): qty 3

## 2024-05-18 NOTE — Progress Notes (Signed)
     Subjective: Patient reports pain as moderate.  He is having issues since block wore off early this morning. Did great with PT yesterday. Labs pending will need dialysis later today or tomorrow.  Yesterday's total administered Morphine  Milligram Equivalents: 252   Objective:   VITALS:   Vitals:   05/17/24 1954 05/18/24 0017 05/18/24 0457 05/18/24 0739  BP: (!) 153/79 (!) 142/71 (!) 157/88 (!) 154/72  Pulse: (!) 59 65 69 73  Resp:      Temp: 97.9 F (36.6 C) 98.2 F (36.8 C) 98.2 F (36.8 C) 98.1 F (36.7 C)  TempSrc:  Oral    SpO2: 100% 100% 100% 100%  Weight:      Height:        Sensation intact distally Intact pulses distally Dorsiflexion/Plantar flexion intact Incision: dressing C/D/I Compartment soft    Lab Results  Component Value Date   WBC 5.1 05/09/2024   HGB 11.9 (L) 05/17/2024   HCT 35.0 (L) 05/17/2024   MCV 96.8 05/09/2024   PLT 140 (L) 05/09/2024   BMET    Component Value Date/Time   NA 137 05/17/2024 0945   K 4.4 05/17/2024 0945   CL 100 05/17/2024 0945   CO2 23 05/09/2024 0828   GLUCOSE 71 05/17/2024 0945   BUN 18 05/17/2024 0945   CREATININE 6.60 (H) 05/17/2024 0945   CREATININE 5.63 (H) 03/22/2023 0432   CALCIUM  8.0 (L) 05/09/2024 0828   EGFR 12 (L) 03/22/2023 0432   GFRNONAA 8 (L) 05/09/2024 0828      Xray: latral UKA components in good alignment.  Assessment/Plan: 1 Day Post-Op   Principal Problem:   Primary osteoarthritis of left knee  S/p L lateral UKA 05/17/24   Post op recs: WB: WBAT Abx: ancef  periop Imaging: PACU xrays DVT prophylaxis: Eliquis twice daily starting postop day 1 for DVT prophylaxis follow up: 2 [place] after surgery for a wound check with Dr[facility]na [facility].  Address: [address], [place], [place] [address]  Office Phone: [phone]    TORIBIO DELENA EDNA 05/18/2024, 7:41 AM   TORIBIO Edna, MD  Contact information:   [phone] 7am-5pm epic message  Dr. Edna, or call office for patient follow up: [phone] After hours and holidays please check [email or web address] for group call information for [facility]

## 2024-05-18 NOTE — Care Management Obs Status (Signed)
 MEDICARE OBSERVATION STATUS NOTIFICATION   Patient Details  Name: [name] MRN: [id number] Date of Birth: 03-13-1986   Medicare Observation Status Notification Given:  Yes    Bridget Cordella Simmonds, LCSW 05/18/2024, 3:26 PM

## 2024-05-18 NOTE — Progress Notes (Signed)
 Physical Therapy Treatment Patient Details Name: [name] MRN: [id number] DOB: 1985-08-22 Today's Date: 05/18/2024   History of Present Illness Pt is a 38 y.o. male who presented 05/16/24 for elective L lateral unicompartmental knee arthroplasty. PMH: aortic valve disease, arthritis, ESRD, GERD, hyperparathyroidism, HTN, sleep apnea, morbid obesity    PT Comments  Pt resting in bed on arrival, agreeable to session, however limited by increased pain, despite pre-medication prior to session. Pt continues to require min A to perform supine>sit with assist to manage and lower LLE to floor. Pt performing sit<>stand transfers with minA-CGA and demonstrating short in room gait with up to min A to maintain balance with RW for support. Pt continues to self-limit weight bearing on L maintaining only forefront contact throughout all standing activity due to pain. Pt performing LLE exercises for increased ROM and strength with HEP issued with pt verbalizing understanding of completion between therapies. Anticipate pt to progress well with continued pain control. Pt continues to benefit from skilled PT services to progress toward functional mobility goals.      If plan is discharge home, recommend the following: A little help with walking and/or transfers;A little help with bathing/dressing/bathroom;Assistance with cooking/housework;Assist for transportation;Help with stairs or ramp for entrance   Can travel by private vehicle        Equipment Recommendations  Other (comment) (tub bench/shower chair)    Recommendations for Other Services       Precautions / Restrictions Precautions Precautions: Fall Restrictions Weight Bearing Restrictions Per Provider Order: Yes LLE Weight Bearing Per Provider Order: Weight bearing as tolerated     Mobility  Bed Mobility Overal bed mobility: Needs Assistance Bed Mobility: Supine to Sit     Supine to sit: Min assist, HOB elevated     General bed  mobility comments: Extra time and minA needed at L leg to assist off EOB and support it while being lowered to ground when transitioning supine to sit L EOB.    Transfers Overall transfer level: Needs assistance Equipment used: Rolling walker (2 wheels) Transfers: Sit to/from Stand Sit to Stand: Min assist           General transfer comment: MinA needed to power up to stand and gain balance.    Ambulation/Gait Ambulation/Gait assistance: Min assist, Contact guard assist Gait Distance (Feet): 8 Feet Assistive device: Rolling walker (2 wheels) Gait Pattern/deviations: Step-to pattern, Decreased stance time - left, Decreased dorsiflexion - left, Decreased stride length, Decreased weight shift to left, Antalgic, Trunk flexed Gait velocity: reduced     General Gait Details: Pt ambulated initially with only forefoot contact on ground. min A for balanc with cues for increased UE use. distance limited by pain   Stairs             Wheelchair Mobility     Tilt Bed    Modified Rankin (Stroke Patients Only)       Balance Overall balance assessment: Needs assistance Sitting-balance support: No upper extremity supported, Feet supported Sitting balance-Leahy Scale: Good     Standing balance support: Bilateral upper extremity supported, During functional activity, Reliant on assistive device for balance Standing balance-Leahy Scale: Poor Standing balance comment: reliant on RW                            Communication Communication Communication: No apparent difficulties  Cognition Arousal: Alert Behavior During Therapy: WFL for tasks assessed/performed   PT - Cognitive impairments: No  apparent impairments                         Following commands: Intact      Cueing Cueing Techniques: Verbal cues  Exercises Total Joint Exercises Ankle Circles/Pumps: AROM, Both, 15 reps Quad Sets: AROM, Left, 5 reps Heel Slides: AAROM, Left, 10 reps Hip  ABduction/ADduction: AAROM, Left, 5 reps Goniometric ROM: 10-40    General Comments General comments (skin integrity, edema, etc.): continued education on positioning in extension with no pillow under knee, pt verbalizing understanding      Pertinent Vitals/Pain Pain Assessment Pain Assessment: Faces Faces Pain Scale: Hurts whole lot Pain Location: L knee Pain Descriptors / Indicators: Discomfort, Grimacing, Guarding, Operative site guarding, Sore Pain Intervention(s): Premedicated before session, Monitored during session, Limited activity within patient's tolerance, Repositioned, Ice applied    Home Living                          Prior Function            PT Goals (current goals can now be found in the care plan section) Acute Rehab PT Goals PT Goal Formulation: With patient/family Time For Goal Achievement: 05/24/24 Progress towards PT goals: Progressing toward goals    Frequency    7X/week      PT Plan      Co-evaluation              AM-PAC PT 6 Clicks Mobility   Outcome Measure  Help needed turning from your back to your side while in a flat bed without using bedrails?: A Little Help needed moving from lying on your back to sitting on the side of a flat bed without using bedrails?: A Little Help needed moving to and from a bed to a chair (including a wheelchair)?: A Little Help needed standing up from a chair using your arms (e.g., wheelchair or bedside chair)?: A Little Help needed to walk in hospital room?: A Little Help needed climbing 3-5 steps with a railing? : A Lot 6 Click Score: 17    End of Session Equipment Utilized During Treatment: Gait belt Activity Tolerance: Patient tolerated treatment well;Patient limited by pain Patient left: in chair;with call bell/phone within reach;with chair alarm set Nurse Communication: Mobility status PT Visit Diagnosis: Unsteadiness on feet (R26.81);Other abnormalities of gait and mobility  (R26.89);Muscle weakness (generalized) (M62.81);Difficulty in walking, not elsewhere classified (R26.2);Pain Pain - Right/Left: Left Pain - part of body: Knee     Time: 9150-9079 PT Time Calculation (min) (ACUTE ONLY): 31 min  Charges:    $Gait Training: 8-22 mins $Therapeutic Exercise: 8-22 mins PT General Charges $$ ACUTE PT VISIT: 1 Visit                     Rabecka Brendel R. PTA Acute Rehabilitation Services Office: [phone]   [name] 05/18/2024, 9:25 AM

## 2024-05-18 NOTE — Plan of Care (Signed)
  Problem: Education: Goal: Knowledge of General Education information will improve Description: Including pain rating scale, medication(s)/side effects and non-pharmacologic comfort measures Outcome: Progressing   Problem: Activity: Goal: Risk for activity intolerance will decrease Outcome: Progressing   Problem: Nutrition: Goal: Adequate nutrition will be maintained Outcome: Progressing   Problem: Coping: Goal: Level of anxiety will decrease Outcome: Progressing   Problem: Safety: Goal: Ability to remain free from injury will improve Outcome: Progressing   Problem: Clinical Measurements: Goal: Postoperative complications will be avoided or minimized Outcome: Progressing

## 2024-05-18 NOTE — Progress Notes (Signed)
 Physical Therapy Treatment Patient Details Name: [name] MRN: [id number] DOB: [birth year] Today's Date: 05/18/2024   History of Present Illness Pt is a 38 y.o. male who presented 05/16/24 for elective L lateral unicompartmental knee arthroplasty. PMH: aortic valve disease, arthritis, ESRD, GERD, hyperparathyroidism, HTN, sleep apnea, morbid obesity    PT Comments  Pt resting in bed on arrival, agreeable to bed level session as pt stating he just returned to bed from being up in the chair and declining EOB/OOB activity due to continued intense pain.  Pt performing supine LE exercises for increased ROM and strength maintenance. Continued education on optimal positioning to promote L knee extension with foam placed at pt L heel. Pt continues to benefit from skilled PT services to progress toward functional mobility goals.     If plan is discharge home, recommend the following: A little help with walking and/or transfers;A little help with bathing/dressing/bathroom;Assistance with cooking/housework;Assist for transportation;Help with stairs or ramp for entrance   Can travel by private vehicle        Equipment Recommendations  Other (comment) (tub bench/shower chair)    Recommendations for Other Services       Precautions / Restrictions Precautions Precautions: Fall;Knee Recall of Precautions/Restrictions: Intact Precaution/Restrictions Comments: no pillow under knee Restrictions Weight Bearing Restrictions Per Provider Order: Yes LLE Weight Bearing Per Provider Order: Weight bearing as tolerated     Mobility  Bed Mobility Overal bed mobility: Needs Assistance Bed Mobility: Supine to Sit     Supine to sit: Min assist, HOB elevated     General bed mobility comments: pt declining EOB/OOB as pt stating he jsut returned to bed from chair    Transfers Overall transfer level: Needs assistance Equipment used: Rolling walker (2 wheels) Transfers: Sit to/from Stand Sit to  Stand: Min assist           General transfer comment: MinA needed to power up to stand and gain balance.    Ambulation/Gait Ambulation/Gait assistance: Min assist, Contact guard assist Gait Distance (Feet): 8 Feet Assistive device: Rolling walker (2 wheels) Gait Pattern/deviations: Step-to pattern, Decreased stance time - left, Decreased dorsiflexion - left, Decreased stride length, Decreased weight shift to left, Antalgic, Trunk flexed Gait velocity: reduced     General Gait Details: Pt ambulated initially with only forefoot contact on ground. min A for balanc with cues for increased UE use. distance limited by pain   Stairs             Wheelchair Mobility     Tilt Bed    Modified Rankin (Stroke Patients Only)       Balance Overall balance assessment: Needs assistance Sitting-balance support: No upper extremity supported, Feet supported Sitting balance-Leahy Scale: Good     Standing balance support: Bilateral upper extremity supported, During functional activity, Reliant on assistive device for balance Standing balance-Leahy Scale: Poor Standing balance comment: reliant on RW                            Communication Communication Communication: No apparent difficulties  Cognition Arousal: Alert Behavior During Therapy: WFL for tasks assessed/performed   PT - Cognitive impairments: No apparent impairments                         Following commands: Intact      Cueing Cueing Techniques: Verbal cues  Exercises Total Joint Exercises Ankle Circles/Pumps: AROM, Both, PROM, 10 reps  Quad Sets: AROM, Left, 5 reps, Supine Heel Slides: AAROM, Left, 5 reps, Supine Hip ABduction/ADduction: AAROM, Left, 5 reps, Supine Goniometric ROM: 10-40    General Comments General comments (skin integrity, edema, etc.): continued education on positioning in extension with no pillow under knee, pt verbalizing understanding, pink foam adjusted to not be  resting under knee      Pertinent Vitals/Pain Pain Assessment Pain Assessment: Faces Faces Pain Scale: Hurts whole lot Pain Location: L knee Pain Descriptors / Indicators: Discomfort, Grimacing, Guarding, Operative site guarding, Sore Pain Intervention(s): Premedicated before session, Monitored during session, Limited activity within patient's tolerance, Patient requesting pain meds-RN notified, Ice applied    Home Living                          Prior Function            PT Goals (current goals can now be found in the care plan section) Acute Rehab PT Goals Patient Stated Goal: to recover well PT Goal Formulation: With patient/family Time For Goal Achievement: 05/24/24 Potential to Achieve Goals: Good    Frequency    7X/week      PT Plan      Co-evaluation              AM-PAC PT 6 Clicks Mobility   Outcome Measure  Help needed turning from your back to your side while in a flat bed without using bedrails?: A Little Help needed moving from lying on your back to sitting on the side of a flat bed without using bedrails?: A Little Help needed moving to and from a bed to a chair (including a wheelchair)?: A Little Help needed standing up from a chair using your arms (e.g., wheelchair or bedside chair)?: A Little Help needed to walk in hospital room?: A Little Help needed climbing 3-5 steps with a railing? : A Lot 6 Click Score: 17    End of Session Equipment Utilized During Treatment: Gait belt Activity Tolerance: Patient tolerated treatment well;Patient limited by pain Patient left: in chair;with call bell/phone within reach;with chair alarm set Nurse Communication: Mobility status PT Visit Diagnosis: Unsteadiness on feet (R26.81);Other abnormalities of gait and mobility (R26.89);Muscle weakness (generalized) (M62.81);Difficulty in walking, not elsewhere classified (R26.2);Pain Pain - Right/Left: Left Pain - part of body: Knee     Time:  1358-1416 PT Time Calculation (min) (ACUTE ONLY): 18 min  Charges:    $Therapeutic Exercise: 8-22 mins PT General Charges $$ ACUTE PT VISIT: 1 Visit                     Basel Defalco R. PTA Acute Rehabilitation Services Office: [phone]   [name] 05/18/2024, 4:08 PM

## 2024-05-18 NOTE — Evaluation (Signed)
 Occupational Therapy Evaluation Patient Details Name: [name] MRN: [id number] DOB: 1986-04-18 Today's Date: 05/18/2024   History of Present Illness   Pt is a 38 y.o. male who presented 05/16/24 for elective L lateral unicompartmental knee arthroplasty. PMH: aortic valve disease, arthritis, ESRD, GERD, hyperparathyroidism, HTN, sleep apnea, morbid obesity     Clinical Impressions Pt was independent in ADLs and walking with a SPC prior to admission. Presents with significant post operative knee pain and heavy reliance on RW. Pt requires min assist for mobility and set up to mod assist for ADLs. Pt will go home with his sister, who works from home, and girlfriend's assistance. Educated pt in compensatory strategies for LB ADLs and provided AE with pt verbalizing understanding. Pt is aware of use of 3 in 1 from previous TKA. Plans to sponge bathe at home, declines shower seat. Will follow acutely, do not anticipate pt will require post acute OT.      If plan is discharge home, recommend the following:   A little help with walking and/or transfers;A lot of help with bathing/dressing/bathroom;Assist for transportation;Help with stairs or ramp for entrance;Assistance with cooking/housework     Functional Status Assessment   Patient has had a recent decline in their functional status and demonstrates the ability to make significant improvements in function in a reasonable and predictable amount of time.     Equipment Recommendations   BSC/3in1     Recommendations for Other Services         Precautions/Restrictions   Precautions Precautions: Fall;Knee Recall of Precautions/Restrictions: Intact Precaution/Restrictions Comments: no pillow under knee Restrictions Weight Bearing Restrictions Per Provider Order: Yes LLE Weight Bearing Per Provider Order: Weight bearing as tolerated     Mobility Bed Mobility               General bed mobility comments: pt in  chair    Transfers Overall transfer level: Needs assistance Equipment used: Rolling walker (2 wheels) Transfers: Sit to/from Stand Sit to Stand: Min assist           General transfer comment: MinA needed to power up to stand and gain balance.      Balance Overall balance assessment: Needs assistance   Sitting balance-Leahy Scale: Good     Standing balance support: Bilateral upper extremity supported, During functional activity, Reliant on assistive device for balance Standing balance-Leahy Scale: Poor Standing balance comment: reliant on RW                           ADL either performed or assessed with clinical judgement   ADL Overall ADL's : Needs assistance/impaired Eating/Feeding: Independent;Sitting   Grooming: Set up;Sitting   Upper Body Bathing: Set up;Sitting   Lower Body Bathing: Moderate assistance;Sit to/from stand   Upper Body Dressing : Set up;Sitting   Lower Body Dressing: Moderate assistance;Sit to/from stand   Toilet Transfer: Minimal assistance;Rolling walker (2 wheels)                   Vision Ability to See in Adequate Light: 0 Adequate Patient Visual Report: No change from baseline       Perception         Praxis         Pertinent Vitals/Pain Pain Assessment Pain Assessment: Faces Faces Pain Scale: Hurts whole lot Pain Location: L knee Pain Descriptors / Indicators: Discomfort, Grimacing, Guarding, Operative site guarding, Sore Pain Intervention(s): Premedicated before session, Repositioned, Ice  applied     Extremity/Trunk Assessment Upper Extremity Assessment Upper Extremity Assessment: Overall WFL for tasks assessed   Lower Extremity Assessment Lower Extremity Assessment: Defer to PT evaluation   Cervical / Trunk Assessment Cervical / Trunk Assessment: Normal   Communication Communication Communication: No apparent difficulties   Cognition Arousal: Alert Behavior During Therapy: WFL for tasks  assessed/performed Cognition: No apparent impairments                               Following commands: Intact       Cueing  General Comments   Cueing Techniques: Verbal cues  continued education on positioning in extension with no pillow under knee, pt verbalizing understanding   Exercises     Shoulder Instructions      Home Living Family/patient expects to be discharged to:: Private residence Living Arrangements: Other relatives (sister) Available Help at Discharge: Family;Available 24 hours/day;Friend(s) Type of Home: House Home Access: Ramped entrance     Home Layout: One level     Bathroom Shower/Tub: Chief Strategy Officer: Handicapped height     Home Equipment: Agricultural consultant (2 wheels);Cane - single point          Prior Functioning/Environment Prior Level of Function : Independent/Modified Independent;Driving;Working/employed             Mobility Comments: Has been using SPC majority of time for past 3 years ADLs Comments: Works as Conservation officer, 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    OT Problem List: Pain;Impaired balance (sitting and/or standing)   OT Treatment/Interventions: Self-care/ADL training;DME and/or AE instruction;Patient/family education;Balance training;Therapeutic activities      OT Goals(Current goals can be found in the care plan section)   Acute Rehab OT Goals OT Goal Formulation: With patient Time For Goal Achievement: 06/01/24 Potential to Achieve Goals: Good   OT Frequency:  Min 2X/week    Co-evaluation              AM-PAC OT 6 Clicks Daily Activity     Outcome Measure Help from another person eating meals?: None Help from another person taking care of personal grooming?: A Little Help from another person toileting, which includes using toliet, bedpan, or urinal?: A Little Help from another person bathing (including washing, rinsing, drying)?: A Lot Help from another person to put on and taking off regular  upper body clothing?: A Little Help from another person to put on and taking off regular lower body clothing?: A Lot 6 Click Score: 17   End of Session Equipment Utilized During Treatment: Gait belt;Rolling walker (2 wheels)  Activity Tolerance: Patient limited by pain Patient left: in chair;with call bell/phone within reach  OT Visit Diagnosis: Unsteadiness on feet (R26.81);Other abnormalities of gait and mobility (R26.89);Pain Pain - Right/Left: Left Pain - part of body: Knee                Time: 8995-8957 OT Time Calculation (min): 38 min Charges:  OT General Charges $OT Visit: 1 Visit OT Evaluation $OT Eval Moderate Complexity: 1 Mod OT Treatments $Self Care/Home Management : 23-37 mins  [name], OTR/L Acute Rehabilitation Services Office: [phone]   Kennth Alex Helling 05/18/2024, 10:48 AM

## 2024-05-19 ENCOUNTER — Encounter (HOSPITAL_COMMUNITY): Payer: Self-pay | Admitting: Orthopedic Surgery

## 2024-05-19 NOTE — Plan of Care (Signed)
  Problem: Education: Goal: Knowledge of General Education information will improve Description: Including pain rating scale, medication(s)/side effects and non-pharmacologic comfort measures Outcome: Progressing   Problem: Safety: Goal: Ability to remain free from injury will improve Outcome: Progressing   Problem: Clinical Measurements: Goal: Postoperative complications will be avoided or minimized Outcome: Progressing

## 2024-05-19 NOTE — Progress Notes (Signed)
 Physical Therapy Treatment Patient Details Name: [name] MRN: [id number] DOB: [birth year] Today's Date: 05/19/2024   History of Present Illness Pt is a 38 y.o. male who presented 05/16/24 for elective L lateral unicompartmental knee arthroplasty. PMH: aortic valve disease, arthritis, ESRD, GERD, hyperparathyroidism, HTN, sleep apnea, morbid obesity    PT Comments  Pt resting in bed on arrival, agreeable to PM session and demonstrating continued progress towards acute goals. Pt continues to be limited by intense LLE pain, despite coordination with RN for pre-medication prior to session. Pt continues to need significantly increased time for all mobility with cues for sequencing throughout. Pt progressing ambulation with RW for support and CGA for safety, however pt continues to unable to achieve foot flat and continues to require cues for LE sequencing and technique. Pt with good recall for importance of knee extension and with good pillow placement on arrival. Pt continues to benefit from skilled PT services to progress toward functional mobility goals.      If plan is discharge home, recommend the following: A little help with walking and/or transfers;A little help with bathing/dressing/bathroom;Assistance with cooking/housework;Assist for transportation;Help with stairs or ramp for entrance   Can travel by private vehicle        Equipment Recommendations  Other (comment) (tub bench/shower chair)    Recommendations for Other Services       Precautions / Restrictions Precautions Precautions: Fall;Knee Recall of Precautions/Restrictions: Intact Precaution/Restrictions Comments: no pillow under knee Restrictions Weight Bearing Restrictions Per Provider Order: Yes LLE Weight Bearing Per Provider Order: Weight bearing as tolerated     Mobility  Bed Mobility Overal bed mobility: Needs Assistance Bed Mobility: Supine to Sit, Sit to Supine     Supine to sit: Min assist, HOB  elevated Sit to supine: Min assist   General bed mobility comments: min A to manage LLE fully off EOB with pt needing significantly increased time to self mobilize with gait belt used as leg lifter    Transfers Overall transfer level: Needs assistance Equipment used: Rolling walker (2 wheels) Transfers: Sit to/from Stand Sit to Stand: Contact guard assist, From elevated surface           General transfer comment: pt standing from slightly elevated EOB with CGA for safety, good hand placement    Ambulation/Gait Ambulation/Gait assistance: Contact guard assist Gait Distance (Feet): 45 Feet Assistive device: Rolling walker (2 wheels) Gait Pattern/deviations: Step-to pattern, Decreased stance time - left, Decreased dorsiflexion - left, Decreased stride length, Decreased weight shift to left, Antalgic, Trunk flexed Gait velocity: very slowed     General Gait Details: Pt ambulated with only forefoot contact on ground with very limited weight bearing on LLE due to pain, heavy reliance on UE support on RW, CGA for safety with no overt LOB noted   Stairs             Wheelchair Mobility     Tilt Bed    Modified Rankin (Stroke Patients Only)       Balance Overall balance assessment: Needs assistance Sitting-balance support: No upper extremity supported, Feet supported Sitting balance-Leahy Scale: Good     Standing balance support: Bilateral upper extremity supported, During functional activity, Reliant on assistive device for balance Standing balance-Leahy Scale: Poor Standing balance comment: reliant on RW                            Communication Communication Communication: No apparent difficulties  Cognition  Arousal: Alert Behavior During Therapy: WFL for tasks assessed/performed   PT - Cognitive impairments: No apparent impairments                         Following commands: Intact      Cueing Cueing Techniques: Verbal cues   Exercises      General Comments        Pertinent Vitals/Pain Pain Assessment Pain Assessment: Faces Faces Pain Scale: Hurts whole lot Pain Location: L knee Pain Descriptors / Indicators: Discomfort, Grimacing, Guarding, Operative site guarding, Sore Pain Intervention(s): Premedicated before session, Monitored during session, Limited activity within patient's tolerance    Home Living                          Prior Function            PT Goals (current goals can now be found in the care plan section) Acute Rehab PT Goals Patient Stated Goal: to recover well PT Goal Formulation: With patient/family Time For Goal Achievement: 05/24/24 Progress towards PT goals: Progressing toward goals    Frequency    7X/week      PT Plan      Co-evaluation              AM-PAC PT 6 Clicks Mobility   Outcome Measure  Help needed turning from your back to your side while in a flat bed without using bedrails?: A Little Help needed moving from lying on your back to sitting on the side of a flat bed without using bedrails?: A Little Help needed moving to and from a bed to a chair (including a wheelchair)?: A Little Help needed standing up from a chair using your arms (e.g., wheelchair or bedside chair)?: A Little Help needed to walk in hospital room?: A Little Help needed climbing 3-5 steps with a railing? : A Lot 6 Click Score: 17    End of Session Equipment Utilized During Treatment: Gait belt Activity Tolerance: Patient limited by pain Patient left: with call bell/phone within reach;in bed Nurse Communication: Mobility status PT Visit Diagnosis: Unsteadiness on feet (R26.81);Other abnormalities of gait and mobility (R26.89);Muscle weakness (generalized) (M62.81);Difficulty in walking, not elsewhere classified (R26.2);Pain Pain - Right/Left: Left Pain - part of body: Knee     Time: 8490-8448 PT Time Calculation (min) (ACUTE ONLY): 42 min  Charges:     $Gait Training: 23-37 mins $Therapeutic Activity: 8-22 mins PT General Charges $$ ACUTE PT VISIT: 1 Visit                     [name] R. PTA Acute Rehabilitation Services Office: [phone]   [name] 05/19/2024, 4:27 PM

## 2024-05-19 NOTE — Progress Notes (Signed)
 Physical Therapy Treatment Patient Details Name: [name] MRN: [id number] DOB: 1986-02-14 Today's Date: 05/19/2024   History of Present Illness Pt is a 38 y.o. male who presented 05/16/24 for elective L lateral unicompartmental knee arthroplasty. PMH: aortic valve disease, arthritis, ESRD, GERD, hyperparathyroidism, HTN, sleep apnea, morbid obesity    PT Comments  Pt resting in bed on arrival, agreeable to session with steady progress towards acute goals, however continues to be limited by pain despite pre-medication prior to session.  Pt able to come to sitting EOB with min A to manage LLE. Pt able to self mobilize LLE back into bed with use of gait belt as leg lifter with CGA for safety and cues for technique at end of session. Pt performing sit<>stand transfers from slightly elevated EOB with CGA for safety with increased time and performing short in room ambulation with CGA for safety and RW for support. Pt continues to self limit weight bearing on LLE due to pain. Continued education on importance of resting with LLE in extension with pt agreeable to use of blue bone foam at end of session. Pt continues to benefit from skilled PT services to progress toward functional mobility goals.     If plan is discharge home, recommend the following: A little help with walking and/or transfers;A little help with bathing/dressing/bathroom;Assistance with cooking/housework;Assist for transportation;Help with stairs or ramp for entrance   Can travel by private vehicle        Equipment Recommendations  Other (comment) (tub bench/shower chair)    Recommendations for Other Services       Precautions / Restrictions Precautions Precautions: Fall;Knee Recall of Precautions/Restrictions: Intact Precaution/Restrictions Comments: no pillow under knee Restrictions Weight Bearing Restrictions Per Provider Order: Yes LLE Weight Bearing Per Provider Order: Weight bearing as tolerated     Mobility   Bed Mobility Overal bed mobility: Needs Assistance Bed Mobility: Supine to Sit, Sit to Supine     Supine to sit: Min assist, HOB elevated Sit to supine: Contact guard assist, Min assist   General bed mobility comments: min A to manage LLE to and off L EOB, CGA initally with pt utilizing gait belt as leg lifter to bring LLE into bed on R EOB, min A to complete las ~6 into bed with HOB flat    Transfers Overall transfer level: Needs assistance Equipment used: Rolling walker (2 wheels) Transfers: Sit to/from Stand Sit to Stand: Contact guard assist, From elevated surface           General transfer comment: pt standing from slightly elevated EOB with CGA for safety, good hand placement    Ambulation/Gait Ambulation/Gait assistance: Contact guard assist Gait Distance (Feet): 18 Feet Assistive device: Rolling walker (2 wheels) Gait Pattern/deviations: Step-to pattern, Decreased stance time - left, Decreased dorsiflexion - left, Decreased stride length, Decreased weight shift to left, Antalgic, Trunk flexed Gait velocity: reduced     General Gait Details: Pt ambulated with only forefoot contact on ground with very limited weight bearing on LLE due to pain, heavy reliance on UE support on RW, CGA for safety with no overt LOB noted   Stairs             Wheelchair Mobility     Tilt Bed    Modified Rankin (Stroke Patients Only)       Balance Overall balance assessment: Needs assistance Sitting-balance support: No upper extremity supported, Feet supported Sitting balance-Leahy Scale: Good     Standing balance support: Bilateral upper extremity supported,  During functional activity, Reliant on assistive device for balance Standing balance-Leahy Scale: Poor Standing balance comment: reliant on RW                            Communication Communication Communication: No apparent difficulties  Cognition Arousal: Alert Behavior During Therapy: WFL for  tasks assessed/performed   PT - Cognitive impairments: No apparent impairments                         Following commands: Intact      Cueing Cueing Techniques: Verbal cues  Exercises      General Comments General comments (skin integrity, edema, etc.): continued education on positioning in extension with no pillow under knee, pt verbalizing understanding with pt agreeable to utilizing blue bone foam at end of session      Pertinent Vitals/Pain Pain Assessment Pain Assessment: Faces Faces Pain Scale: Hurts whole lot Pain Location: L knee Pain Descriptors / Indicators: Discomfort, Grimacing, Guarding, Operative site guarding, Sore Pain Intervention(s): Premedicated before session, Monitored during session, Limited activity within patient's tolerance    Home Living                          Prior Function            PT Goals (current goals can now be found in the care plan section) Acute Rehab PT Goals Patient Stated Goal: to recover well PT Goal Formulation: With patient/family Time For Goal Achievement: 05/24/24 Progress towards PT goals: Progressing toward goals    Frequency    7X/week      PT Plan      Co-evaluation              AM-PAC PT 6 Clicks Mobility   Outcome Measure  Help needed turning from your back to your side while in a flat bed without using bedrails?: A Little Help needed moving from lying on your back to sitting on the side of a flat bed without using bedrails?: A Little Help needed moving to and from a bed to a chair (including a wheelchair)?: A Little Help needed standing up from a chair using your arms (e.g., wheelchair or bedside chair)?: A Little Help needed to walk in hospital room?: A Little Help needed climbing 3-5 steps with a railing? : A Lot 6 Click Score: 17    End of Session Equipment Utilized During Treatment: Gait belt Activity Tolerance: Patient limited by pain Patient left: with call  bell/phone within reach;in bed Nurse Communication: Mobility status PT Visit Diagnosis: Unsteadiness on feet (R26.81);Other abnormalities of gait and mobility (R26.89);Muscle weakness (generalized) (M62.81);Difficulty in walking, not elsewhere classified (R26.2);Pain Pain - Right/Left: Left Pain - part of body: Knee     Time: 9084-9053 PT Time Calculation (min) (ACUTE ONLY): 31 min  Charges:    $Gait Training: 8-22 mins $Therapeutic Activity: 8-22 mins PT General Charges $$ ACUTE PT VISIT: 1 Visit                     Zaven Klemens R. PTA Acute Rehabilitation Services Office: [phone]   [name] 05/19/2024, 9:57 AM

## 2024-05-19 NOTE — Progress Notes (Signed)
     Subjective: Patient had a difficult day with pain yesterday. Limited mobility with PT. Plan for dialysis today. Unable to get labs drawn yesterday as he is a difficult stick. Will try at dialysis today. Possible discharge home later today or tomorrow depending on pain control and progress with PT.  Yesterday's total administered Morphine  Milligram Equivalents: 96   Objective:   VITALS:   Vitals:   05/18/24 0739 05/18/24 1500 05/18/24 2039 05/19/24 0438  BP: (!) 154/72 139/70 (!) 157/78 (!) 150/74  Pulse: 73  75 72  Resp:   17 17  Temp: 98.1 F (36.7 C) 98.6 F (37 C) 98.9 F (37.2 C) 98.6 F (37 C)  TempSrc:  Oral Oral Oral  SpO2: 100% 100% 100% 100%  Weight:      Height:        Sensation intact distally Intact pulses distally Dorsiflexion/Plantar flexion intact Incision: dressing C/D/I Compartment soft    Lab Results  Component Value Date   WBC 5.1 05/09/2024   HGB 11.9 (L) 05/17/2024   HCT 35.0 (L) 05/17/2024   MCV 96.8 05/09/2024   PLT 140 (L) 05/09/2024   BMET    Component Value Date/Time   NA 137 05/17/2024 0945   K 4.4 05/17/2024 0945   CL 100 05/17/2024 0945   CO2 23 05/09/2024 0828   GLUCOSE 71 05/17/2024 0945   BUN 18 05/17/2024 0945   CREATININE 6.60 (H) 05/17/2024 0945   CREATININE 5.63 (H) 03/22/2023 0432   CALCIUM  8.0 (L) 05/09/2024 0828   EGFR 12 (L) 03/22/2023 0432   GFRNONAA 8 (L) 05/09/2024 0828      Xray: latral UKA components in good alignment.  Assessment/Plan: 2 Days Post-Op   Principal Problem:   Primary osteoarthritis of left knee  S/p L lateral UKA 05/17/24   Post op recs: WB: WBAT Abx: ancef  periop Imaging: PACU xrays DVT prophylaxis: Eliquis twice daily starting postop day 1 for DVT prophylaxis follow up: 2 Spera after surgery for a wound check with Dr. Edna at [facility].  Address: [address], [place], [place] [address]  Office Phone: [phone]    TORIBIO DELENA EDNA 05/19/2024, 6:21 AM   TORIBIO Edna, MD  Contact information:   [phone] 7am-5pm epic message Dr. Edna, or call office for patient follow up: [phone] After hours and holidays please check [email or web address] for group call information for [facility]

## 2024-05-19 NOTE — Plan of Care (Signed)
  Problem: Education: Goal: Knowledge of General Education information will improve Description: Including pain rating scale, medication(s)/side effects and non-pharmacologic comfort measures Outcome: Progressing   Problem: Activity: Goal: Risk for activity intolerance will decrease Outcome: Progressing   Problem: Pain Managment: Goal: General experience of comfort will improve and/or be controlled Outcome: Progressing

## 2024-05-20 LAB — CBC
HCT: 31.6 % — ABNORMAL LOW (ref 39.0–52.0)
Hemoglobin: 10.6 g/dL — ABNORMAL LOW (ref 13.0–17.0)
MCH: 30.8 pg (ref 26.0–34.0)
MCHC: 33.5 g/dL (ref 30.0–36.0)
MCV: 91.9 fL (ref 80.0–100.0)
Platelets: 159 K/uL (ref 150–400)
RBC: 3.44 MIL/uL — ABNORMAL LOW (ref 4.22–5.81)
RDW: 13.7 % (ref 11.5–15.5)
WBC: 5.5 K/uL (ref 4.0–10.5)
nRBC: 0 % (ref 0.0–0.2)

## 2024-05-20 LAB — BASIC METABOLIC PANEL WITH GFR
Anion gap: 15 (ref 5–15)
BUN: 52 mg/dL — ABNORMAL HIGH (ref 6–20)
CO2: 23 mmol/L (ref 22–32)
Calcium: 7 mg/dL — ABNORMAL LOW (ref 8.9–10.3)
Chloride: 96 mmol/L — ABNORMAL LOW (ref 98–111)
Creatinine, Ser: 11.4 mg/dL — ABNORMAL HIGH (ref 0.61–1.24)
GFR, Estimated: 5 mL/min — ABNORMAL LOW (ref >60)
Glucose, Bld: 101 mg/dL — ABNORMAL HIGH (ref 70–99)
Potassium: 5 mmol/L (ref 3.5–5.1)
Sodium: 134 mmol/L — ABNORMAL LOW (ref 135–145)

## 2024-05-20 MED ORDER — HEPARIN SODIUM (PORCINE) 1000 UNIT/ML IJ SOLN
INTRAMUSCULAR | Status: AC
  Filled 2024-05-20: qty 3

## 2024-05-20 MED ORDER — MORPHINE SULFATE (PF) 2 MG/ML IV SOLN
INTRAVENOUS | Status: AC
  Filled 2024-05-20: qty 2

## 2024-05-20 MED ORDER — CHLORHEXIDINE GLUCONATE CLOTH 2 % EX PADS
6.0000 | MEDICATED_PAD | Freq: Every day | CUTANEOUS | Status: DC
  Administered 2024-05-21 – 2024-05-22 (×2): 6 via TOPICAL

## 2024-05-20 NOTE — Consult Note (Addendum)
 Renal Service Consult Note [facility] Alex JONETTA Fret, MD  Patient: [name] Date: 05/20/2024 Requesting Physician: Dr. Edna  Reason for Consult: ESRD pt s/p knee surgery HPI: The patient is a 38 y.o. year-old w/ PMH as below who presented on 10/15 for elective knee surgery. Surgery was done by Dr. Edna on 10/15. Pt in on HD MWF schedule. He did HD Monday and Tuesday this prior to surgery on Wednesday. We are asked to see for dialysis.    Pt seen in HD unit, the L knee is still hurting. No other c/o's. No SOB, CP or abd pain.    ROS - denies CP, no joint pain, no HA, no blurry vision, no rash, no diarrhea, no nausea/ vomiting   Past Medical History  Past Medical History:  Diagnosis Date   Aortic valve disease    Possible bicuspid aortic valve with mild aortic valve stenosis.  Mean aortic valve gradient 7.6 mmHg with DVI 0.55 and SVI from 51.  Aortic valve number of cusps indeterminate but question bicuspid with calcification by echo 02/2024   Arthritis    all over (11/19/2016)   Chronic lower back pain    Complication of anesthesia    A little while to wake up after knee surgery in 2008   Dizziness    when coming off of dialysis   ESRD (end stage renal disease) on dialysis [facility])    MWF and goes to [facility] (11/19/2016)   Family history of anesthesia complication    mom slow to wake up   GERD (gastroesophageal reflux disease)    takes Omeprazole  as needed   Headache(784.0)    monthly (11/19/2016)   History of hiatal hernia    Hyperparathyroidism    Hypertension    Joint pain    Joint swelling    Morbid obesity (HCC)    PONV (postoperative nausea and vomiting)    Sleep apnea    Thyroid  disease    Past Surgical History  Past Surgical History:  Procedure Laterality Date   AV FISTULA PLACEMENT Bilateral 2005,2007   right; left   AV FISTULA REPAIR Right 2007   tried to clean it out; couldn't do it   BOTOX  INJECTION N/A  05/25/2019   Procedure: BOTOX  INJECTION;  Surgeon: Legrand Victory LITTIE DOUGLAS, MD;  Location: [facility] ENDOSCOPY;  Service: Gastroenterology;  Laterality: N/A;   BREATH TEK H PYLORI N/A 05/15/2014   Procedure: BREATH TEK H PYLORI;  Surgeon: Alm Angle, MD;  Location: [facility];  Service: General;  Laterality: N/A;   CHOLECYSTECTOMY N/A 11/23/2016   Procedure: LAPAROSCOPIC CHOLECYSTECTOMY WITH INTRAOPERATIVE CHOLANGIOGRAM;  Surgeon: Debby Shipper, MD;  Location: [facility] OR;  Service: General;  Laterality: N/A;   ESOPHAGOGASTRODUODENOSCOPY N/A 12/07/2014   Procedure: ESOPHAGOGASTRODUODENOSCOPY (EGD);  Surgeon: Alm Angle, MD;  Location: [facility] ENDOSCOPY;  Service: General;  Laterality: N/A;   ESOPHAGOGASTRODUODENOSCOPY N/A 12/17/2014   Procedure: ESOPHAGOGASTRODUODENOSCOPY (EGD);  Surgeon: Alm Angle, MD;  Location: [facility] OR;  Service: General;  Laterality: N/A;   ESOPHAGOGASTRODUODENOSCOPY N/A 08/16/2016   Procedure: ESOPHAGOGASTRODUODENOSCOPY (EGD);  Surgeon: Gordy CHRISTELLA Starch, MD;  Location: [facility] ENDOSCOPY;  Service: Gastroenterology;  Laterality: N/A;   ESOPHAGOGASTRODUODENOSCOPY (EGD) WITH PROPOFOL  N/A 07/05/2017   Procedure: ESOPHAGOGASTRODUODENOSCOPY (EGD) WITH PROPOFOL ;  Surgeon: Teressa Toribio SQUIBB, MD;  Location: [facility] ENDOSCOPY;  Service: Endoscopy;  Laterality: N/A;   ESOPHAGOGASTRODUODENOSCOPY (EGD) WITH PROPOFOL  N/A 05/25/2019   Procedure: ESOPHAGOGASTRODUODENOSCOPY (EGD) WITH PROPOFOL ;  Surgeon: Legrand Victory LITTIE DOUGLAS, MD;  Location: [facility]OSCOPY;  Service: Gastroenterology;  Laterality: N/A;   HERNIA REPAIR  11/2014   w/lap band OR   JOINT REPLACEMENT     KNEE ARTHROSCOPY Left 2007   LAPAROSCOPIC GASTRIC BANDING N/A 11/12/2014   Procedure: LAPAROSCOPIC GASTRIC BANDING;  Surgeon: Alm Angle, MD;  Location: [facility] ORS;  Service: General;  Laterality: N/A;   PARATHYROIDECTOMY N/A 03/30/2013   Procedure: TOTAL PARATHYROIDECTOMY WITH AUTOTRANSPLANT;  Surgeon: Krystal CHRISTELLA Spinner, MD;  Location: [facility] OR;  Service: General;  Laterality:  N/A;  Autotransplant to left lower arm.   PARTIAL KNEE ARTHROPLASTY Left 05/17/2024   Procedure: ARTHROPLASTY, KNEE, UNICOMPARTMENTAL;  Surgeon: [name], MD;  Location: [facility] OR;  Service: Orthopedics;  Laterality: Left;  LATERAL   TOTAL KNEE ARTHROPLASTY Right 03/27/2014   Procedure: UNICOMPARTMENTAL ARTHROPLASTY;  Surgeon: [name], MD;  Location: [facility] OR;  Service: Orthopedics;  Laterality: Right;   Family History  Family History  Problem Relation Age of Onset   Hypertension Mother    Diabetes Father    Kidney Stones Sister    Diabetes Maternal Grandmother    Liver cancer Maternal Uncle    Colon cancer Neg Hx    Colon polyps Neg Hx    Esophageal cancer Neg Hx    Rectal cancer Neg Hx    Stomach cancer Neg Hx    Social History  reports that he quit smoking about 5 years ago. His smoking use included cigarettes. He started smoking about 21 years ago. He has a 1.6 pack-year smoking history. He has never used smokeless tobacco. He reports that he does not drink alcohol and does not use drugs. Allergies  Allergies  Allergen Reactions   Dilaudid  [Hydromorphone  Hcl] Other (See Comments)    Patient became hypoxic to 60% with dilaudid  1mg  IV   Amlodipine Other (See Comments)   Ascorbate Itching   Midodrine Itching   Nsaids     Gastric bypass patient should avoid NSAIDs   Home medications Prior to Admission medications   Medication Sig Start Date End Date Taking? Authorizing Provider  acetaminophen  (TYLENOL ) 500 MG tablet Take 500-1,000 mg by mouth every 6 (six) hours as needed (pain.).   Yes [provider]  acetaminophen  (TYLENOL ) 500 MG tablet Take 2 tablets (1,000 mg total) by mouth every 8 (eight) hours as needed. 05/17/24 06/16/24 Yes [name], [name]  albuterol  (PROVENTIL  HFA;VENTOLIN  HFA) 108 (90 Base) MCG/ACT inhaler Inhale 2 puffs into the lungs every 4 (four) hours as needed for wheezing or shortness of breath. 05/14/16  Yes Pennie Elsie PARAS, [name]  apixaban (ELIQUIS) 2.5 MG TABS tablet Take 1 tablet (2.5 mg total) by mouth 2 (two) times daily. 05/17/24  Yes [name], [name]  diclofenac  Sodium (VOLTAREN ) 1 % GEL Apply 4 g topically 4 (four) times daily as needed (Pain). 03/13/23  Yes [provider]  famotidine  (PEPCID ) 20 MG tablet Take 1 tablet (20 mg total) by mouth daily. Patient taking differently: Take 20 mg by mouth daily as needed for heartburn or indigestion. 04/27/19  Yes Donah Riis A, [name]  HYDROcodone -acetaminophen  (NORCO/VICODIN) 5-325 MG tablet Take 1 tablet by mouth every 8 (eight) hours as needed (pain.).   Yes [provider]  methocarbamol  (ROBAXIN ) 500 MG tablet Take 1 tablet (500 mg total) by mouth every 8 (eight) hours as needed for up to 10 days for muscle spasms. 05/17/24 05/27/24 Yes [name], [name]  omeprazole  (PRILOSEC) 20 MG capsule Take 40 mg by mouth in the morning and at  bedtime. 03/21/23  Yes [provider]  ondansetron  (ZOFRAN ) 4 MG tablet Take 1 tablet (4 mg total) by mouth every 8 (eight) hours as needed for up to 14 days for nausea or vomiting. 05/17/24 05/31/24 Yes Renae Bernarda HERO, [name]  ondansetron  (ZOFRAN -ODT) 4 MG disintegrating tablet Take 1 tablet (4 mg total) by mouth every 8 (eight) hours as needed for nausea or vomiting. 04/03/23  Yes [name], Charmaine FALCON, MD  oxyCODONE  (ROXICODONE ) 5 MG immediate release tablet Take 1 tablet (5 mg total) by mouth every 4 (four) hours as needed for up to 7 days for severe pain (pain score 7-10) or moderate pain (pain score 4-6). 05/17/24 05/24/24 Yes Renae Bernarda HERO, [name]  polyethylene glycol (MIRALAX) 17 g packet Take 17 g by mouth daily. 05/17/24  Yes Renae Bernarda HERO, [name]  traMADol  (ULTRAM ) 50 MG tablet Take 1 tablet (50 mg total) by mouth every 12 (twelve) hours as needed. 12/02/22  Yes Addie Cordella Hamilton, MD  b complex-vitamin c-folic acid  (NEPHRO-VITE) 0.8 MG TABS tablet Take 1 tablet by mouth daily.   08/16/16   [provider]  sucralfate (CARAFATE) 1 GM/10ML suspension Take 1 g by mouth in the morning, at noon, and at bedtime.    [provider]     Vitals:   05/19/24 2017 05/20/24 0425 05/20/24 0739 05/20/24 1457  BP: 137/72 136/69 (!) 140/80 132/71  Pulse: 90 83 81 76  Resp: 18 18 18 16   Temp: 98.7 F (37.1 C) 98.7 F (37.1 C) 98.2 F (36.8 C) 98.7 F (37.1 C)  TempSrc: Oral Oral Oral Oral  SpO2: 100% 100% 100% 100%  Weight:      Height:       Exam Gen alert, no distress Sclera anicteric, throat clear  No jvd or bruits Chest clear bilat to bases RRR no MRG Abd soft ntnd no mass or ascites +bs Ext LLE 1-2+ edema, RLE no edema Neuro is alert, Ox 3 , nf    AVF+bruit   Home bp meds: none   OP HD: MWF [facility] 4h  B450   74.4kg   3K   AVF  Heparin  10,000 Last HD mon/tues, post wts 74/ 74.4 Good compliance, gets to dry wt Mircera 50 mcg q 4 wks, last 10/14    Assessment/ Plan: S/P L lateral UKA (knee surgery) : done 10/15 per orthopedics.  ESRD: on HD MWF. Had outpt HD mon/tues this week. Plan will be for HD tonight, will be his 3rd and final HD this week.  HTN: bp's stable, not on BP lowering meds at home Volume: up 3-4kg by wt, +left leg edema. Plan UF 1.5- 2 L w/ HD.  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        Myer Fret  MD [facility] 05/20/2024, 3:10 PM  Recent Labs  Lab 05/17/24 0945 05/20/24 1149  HGB 11.9* 10.6*  CALCIUM   --  7.0*  CREATININE 6.60* 11.40*  K 4.4 5.0   Inpatient medications:  apixaban  2.5 mg Oral Q12H   docusate sodium   100 mg Oral BID   pantoprazole   40 mg Oral Daily    lactated ringers  Stopped (05/20/24 0939)   acetaminophen , diphenhydrAMINE , menthol  **OR** phenol, methocarbamol  **OR** methocarbamol  (ROBAXIN ) injection, morphine  (PF), ondansetron  **OR** ondansetron  (ZOFRAN ) IV, oxyCODONE , polyethylene glycol

## 2024-05-20 NOTE — Progress Notes (Signed)
 Orthopaedic Trauma Service Progress Note weekend coverage  Patient ID: [name] MRN: [id number] DOB/AGE: Feb 09, 1986 38 y.o.  Subjective:  Doing fair this afternoon Left knee is sore He did sit up in the chair for several hours this morning Ambulated 18 feet yesterday and 25 feet this morning  Unfortunately he did not receive dialysis yesterday  I have reached out to nephrology and they will see him to get him on the dialysis schedule  Last dialysis session was Tuesday in preparation for surgery.  Normal schedule is Monday Wednesday Friday in [place]  No chest pain or shortness of breath No other complaints  Labs obtained this morning Creatinine is 11.40 and BUN is 52 H&H are stable at 10.6 and 31.6 Platelets are 159,000  ROS  Today's  total administered Morphine  Milligram Equivalents: 57 Yesterday's total administered Morphine  Milligram Equivalents: 103.5  Objective:   VITALS:   Vitals:   05/19/24 2017 05/20/24 0425 05/20/24 0739 05/20/24 1457  BP: 137/72 136/69 (!) 140/80 132/71  Pulse: 90 83 81 76  Resp: 18 18 18 16   Temp: 98.7 F (37.1 C) 98.7 F (37.1 C) 98.2 F (36.8 C) 98.7 F (37.1 C)  TempSrc: Oral Oral Oral Oral  SpO2: 100% 100% 100% 100%  Weight:      Height:        Estimated body mass index is 27.46 kg/m as calculated from the following:   Height as of this encounter: 5' 5 (1.651 m).   Weight as of this encounter: 74.8 kg.   Intake/Output      10/17 0701 10/18 0700 10/18 0701 10/19 0700   P.O. 240 120   Total Intake(mL/kg) 240 (3.2) 120 (1.6)   Net +240 +120          LABS  Results for orders placed or performed during the hospital encounter of 05/17/24 (from the past 24 hours)  CBC     Status: Abnormal   Collection Time: 05/20/24 11:49 AM  Result Value Ref Range   WBC 5.5 4.0 - 10.5 K/uL   RBC 3.44 (L) 4.22 - 5.81 MIL/uL   Hemoglobin 10.6 (L) 13.0 -  17.0 g/dL   HCT 68.3 (L) 60.9 - 47.9 %   MCV 91.9 80.0 - 100.0 fL   MCH 30.8 26.0 - 34.0 pg   MCHC 33.5 30.0 - 36.0 g/dL   RDW 86.2 88.4 - 84.4 %   Platelets 159 150 - 400 K/uL   nRBC 0.0 0.0 - 0.2 %  Basic metabolic panel     Status: Abnormal   Collection Time: 05/20/24 11:49 AM  Result Value Ref Range   Sodium 134 (L) 135 - 145 mmol/L   Potassium 5.0 3.5 - 5.1 mmol/L   Chloride 96 (L) 98 - 111 mmol/L   CO2 23 22 - 32 mmol/L   Glucose, Bld 101 (H) 70 - 99 mg/dL   BUN 52 (H) 6 - 20 mg/dL   Creatinine, Ser 88.59 (H) 0.61 - 1.24 mg/dL   Calcium  7.0 (L) 8.9 - 10.3 mg/dL   GFR, Estimated 5 (L) >60 mL/min   Anion gap 15 5 - 15     PHYSICAL EXAM:   Gen: Sitting up in bed, no acute distress, pleasant Lungs: Unlabored Cardiac: Regular Ext:       Left lower extremity  Dressing left knee is clean, dry and intact  Extremity is warm  Palpable DP pulse  Compartments are soft  Ankle flexion extension, inversion and eversion are intact  DPN, SPN, TN sensory functions intact  No DCT   Assessment/Plan: 3 Days Post-Op   Principal Problem:   Primary osteoarthritis of left knee   Anti-infectives (From admission, onward)    Start     Dose/Rate Route Frequency Ordered Stop   05/17/24 1445  ceFAZolin  (ANCEF ) IVPB 2g/100 mL premix  Status:  Discontinued        2 g 200 mL/hr over 30 Minutes Intravenous Every 6 hours 05/17/24 1444 05/17/24 1615   05/17/24 0915  ceFAZolin  (ANCEF ) IVPB 2g/100 mL premix        2 g 200 mL/hr over 30 Minutes Intravenous On call to O.R. 05/17/24 9096 05/17/24 1144     .  Postop day 3  Principal Problem:   Primary osteoarthritis of left knee   S/p L lateral UKA 05/17/24   End-stage renal disease: Dialysis Monday Wednesday Friday  Have communicated with the renal team.  They plan on seeing today and will get on the dialysis schedule soon as possible  Possible discharge home tomorrow versus Monday   Post op recs: WB: WBAT Abx: ancef  periop--->  completed Imaging: PACU xrays---> stable DVT prophylaxis: Eliquis twice daily starting postop day 1 for DVT prophylaxis follow up: 2 Eroh after surgery for a wound check with Dr. Edna at [facility].  Address: [address], [place], [place] [address]  Office Phone: [phone]    Francis MICAEL Mt, [name] [phone] MIGDALIA) 05/20/2024, 3:21 PM  [facility] [address] [place] [address] [phone] [phone] (F)    After 5pm and on the weekends please log on to Amion, go to orthopaedics and the look under the [facility] Call for the provider(s) on call. You can also call our office at [phone] and then follow the prompts to be connected to the call team.  Patient ID: [name], male   DOB: [birth year], 38 y.o.   MRN: [id number]

## 2024-05-20 NOTE — Progress Notes (Signed)
 Occupational Therapy Treatment Patient Details Name: [name] MRN: [id number] DOB: 07-16-1986 Today's Date: 05/20/2024   History of present illness Pt is a 38 y.o. male who presented 05/16/24 for elective L lateral unicompartmental knee arthroplasty. PMH: aortic valve disease, arthritis, ESRD, GERD, hyperparathyroidism, HTN, sleep apnea, morbid obesity   OT comments  Pt. Seen for skilled OT treatment session.  Increased time and education on management of LLE during position changes.  Pt. Able to lower LLE as foot portion of recliner lowering with increased time and encouragement while holding his leg lifter to support LLE and max cues not to lift the leg so he can tolerate lowering it and tolerating increased flexion.  Pt. Able to achieve fully lowering LLE and having heel on the floor.  Utilized PLB and relaxation to aide with anxiety/tension during movement of LLE.  Also reviewed with pt. Getting more comfortable moving and managing LLE along with guiding his care and when others are assisting him.  Session then focused on use of A/E for LB ADLs.  Pt. Tolerated well on RLE for sequencing and learning purposes and will try RLE next session.        If plan is discharge home, recommend the following:  A little help with walking and/or transfers;A lot of help with bathing/dressing/bathroom;Assist for transportation;Help with stairs or ramp for entrance;Assistance with cooking/housework   Equipment Recommendations  BSC/3in1    Recommendations for Other Services      Precautions / Restrictions Precautions Precautions: Fall;Knee Recall of Precautions/Restrictions: Intact Precaution/Restrictions Comments: no pillow under knee Restrictions LLE Weight Bearing Per Provider Order: Weight bearing as tolerated       Mobility Bed Mobility                    Transfers                   General transfer comment: while seated, pt. with increased time and encouragement able  to tolerate lowering LLE as reclining leg portion lowered and achieved sitting with b feet on the floor. pt also able to slide LLE/foot back towards the chair.  when repositioning at end of session. pt. able to lean L/R and scoot each hip back in chair and then also use BLEs and scoot back with use of arm rests and lifting buttocks partially off of the chair pushing through BLEs as he scooted hips and buttocks back into chair.  Pt. Also able to use RUE to manage recliner lever and slowly raise and lower foot portion of recliner in hopes he could complete up/down without assistance while up in recliner as a way of repositioning and sitting upright with cont. Focus and work on tolerating flex/ext. Of LLE.       Balance                                           ADL either performed or assessed with clinical judgement   ADL Overall ADL's : Needs assistance/impaired             Lower Body Bathing: With adaptive equipment Lower Body Bathing Details (indicate cue type and reason): reviewed use of LH sponge for LB bathing     Lower Body Dressing: Minimal assistance;Sitting/lateral leans;Cueing for sequencing;With adaptive equipment Lower Body Dressing Details (indicate cue type and reason): pt. able to use reacher and sock aide  for LB dressing of sock. used RLE today as LLE still difficult to move, will attempt LLE next session                    Extremity/Trunk Assessment              Vision       Perception     Praxis     Communication Communication Communication: No apparent difficulties   Cognition Arousal: Alert Behavior During Therapy: WFL for tasks assessed/performed Cognition: No apparent impairments                               Following commands: Intact        Cueing   Cueing Techniques: Verbal cues  Exercises      Shoulder Instructions       General Comments education on L ankle ROM stretch. Pt tolerating very little  ROM to LLE in general. Would benefit from a CPM    Pertinent Vitals/ Pain       Pain Assessment Pain Assessment: Faces Faces Pain Scale: Hurts little more Pain Location: L knee Pain Descriptors / Indicators: Discomfort, Grimacing, Guarding, Operative site guarding, Sore Pain Intervention(s): Limited activity within patient's tolerance, Monitored during session, Repositioned, Premedicated before session  Home Living                                          Prior Functioning/Environment              Frequency  Min 2X/week        Progress Toward Goals  OT Goals(current goals can now be found in the care plan section)  Progress towards OT goals: Progressing toward goals     Plan      Co-evaluation                 AM-PAC OT 6 Clicks Daily Activity     Outcome Measure   Help from another person eating meals?: None Help from another person taking care of personal grooming?: A Little Help from another person toileting, which includes using toliet, bedpan, or urinal?: A Little Help from another person bathing (including washing, rinsing, drying)?: A Lot Help from another person to put on and taking off regular upper body clothing?: A Little Help from another person to put on and taking off regular lower body clothing?: A Lot 6 Click Score: 17    End of Session Equipment Utilized During Treatment: Other (comment) (leg lifter and A/E)  OT Visit Diagnosis: Unsteadiness on feet (R26.81);Other abnormalities of gait and mobility (R26.89);Pain Pain - Right/Left: Left Pain - part of body: Knee   Activity Tolerance Patient tolerated treatment well   Patient Left in chair;with call bell/phone within reach   Nurse Communication          Time: 8953-8876 OT Time Calculation (min): 37 min  Charges: OT General Charges $OT Visit: 1 Visit OT Treatments $Self Care/Home Management : 23-37 mins  Randall, COTA/L Acute Rehabilitation [phone]    [name]-COTA/L  05/20/2024, 11:35 AM

## 2024-05-20 NOTE — Progress Notes (Signed)
 Physical Therapy Treatment Patient Details Name: [name] MRN: [id number] DOB: 01-28-1986 Today's Date: 05/20/2024   History of Present Illness Pt is a 38 y.o. male who presented 05/16/24 for elective L lateral unicompartmental knee arthroplasty. PMH: aortic valve disease, arthritis, ESRD, GERD, hyperparathyroidism, HTN, sleep apnea, morbid obesity    PT Comments  Pt received in bed, deferred ambulation this afternoon due to having just gotten back in bed and awaiting pain meds. Pt continues to have lateral and inferior knee pain. Worked on A/ AA/ P ROM in supine and sitting as well as strengthening exercises. Pt tolerated up to 60 deg L knee flexion in sitting. Pt used strap to stretch L heel cord and tolerated well. Still getting minimal quad activation on L and has increased swelling L thigh which is inhibiting. Will hopefully go to HD later today as has not had since pre surgery. Rec HHPT at d/c.     If plan is discharge home, recommend the following: A little help with walking and/or transfers;A little help with bathing/dressing/bathroom;Assistance with cooking/housework;Assist for transportation;Help with stairs or ramp for entrance   Can travel by private vehicle        Equipment Recommendations  Other (comment) (tub bench)    Recommendations for Other Services       Precautions / Restrictions Precautions Precautions: Fall;Knee Precaution Booklet Issued: No Recall of Precautions/Restrictions: Intact Precaution/Restrictions Comments: no pillow under knee Restrictions Weight Bearing Restrictions Per Provider Order: Yes LLE Weight Bearing Per Provider Order: Weight bearing as tolerated     Mobility  Bed Mobility Overal bed mobility: Needs Assistance Bed Mobility: Supine to Sit, Sit to Supine     Supine to sit: Min assist Sit to supine: Min assist   General bed mobility comments: worked on bed mobility to/from EOB having pt use strap to assist with LLE but still  needed min A at times due to pain    Transfers Overall transfer level: Needs assistance Equipment used: Rolling walker (2 wheels) Transfers: Sit to/from Stand Sit to Stand: Contact guard assist, From elevated surface           General transfer comment: pt standing from slightly elevated EOB with CGA for safety, good hand placement, increased time needed    Ambulation/Gait Ambulation/Gait assistance: Contact guard assist Gait Distance (Feet): 25 Feet Assistive device: Rolling walker (2 wheels) Gait Pattern/deviations: Step-to pattern, Decreased stance time - left, Decreased dorsiflexion - left, Decreased stride length, Decreased weight shift to left, Antalgic, Trunk flexed Gait velocity: very slowed Gait velocity interpretation: <1.31 ft/sec, indicative of household ambulator Pre-gait activities: standing wt shift to L with foot taps to attempt to stop cramping. Calf stretch in standing General Gait Details: Pt ambulated with only forefoot contact on ground with very limited weight bearing on LLE due to pain, heavy reliance on UE support on RW, CGA for safety with no overt LOB noted   Stairs             Wheelchair Mobility     Tilt Bed    Modified Rankin (Stroke Patients Only)       Balance Overall balance assessment: Needs assistance Sitting-balance support: No upper extremity supported, Feet supported Sitting balance-Leahy Scale: Good     Standing balance support: Bilateral upper extremity supported, During functional activity, Reliant on assistive device for balance Standing balance-Leahy Scale: Poor Standing balance comment: reliant on RW  Communication Communication Communication: No apparent difficulties  Cognition Arousal: Alert Behavior During Therapy: WFL for tasks assessed/performed   PT - Cognitive impairments: No apparent impairments                       PT - Cognition Comments: gets anxious at  times Following commands: Intact      Cueing Cueing Techniques: Verbal cues  Exercises Total Joint Exercises Ankle Circles/Pumps: AROM, Both, 10 reps Quad Sets: AROM, Left, 10 reps, Seated Heel Slides: AAROM, Left, 10 reps, Seated Hip ABduction/ADduction: AAROM, Left, 5 reps, Supine Straight Leg Raises: AAROM, Left, 5 reps, Seated Long Arc Quad: AAROM, Left, 10 reps, Seated Knee Flexion: AROM, Left (60 secs) Goniometric ROM: 15-60 Marching in Standing: AROM, Left, 10 reps, Standing Standing Hip Extension: AROM, Left, 10 reps, Standing Other Exercises Other Exercises: AA ankle pumps x10 Other Exercises: heel cord stretch with strap x 60 secs    General Comments General comments (skin integrity, edema, etc.): passive knee flexion with bed raised and L knee dangling. Was able to tolerate up to 65 deg knee flexion      Pertinent Vitals/Pain Pain Assessment Pain Assessment: 0-10 Pain Score: 9  Faces Pain Scale: Hurts whole lot Pain Location: L knee Pain Descriptors / Indicators: Discomfort, Grimacing, Guarding, Operative site guarding, Sore Pain Intervention(s): Limited activity within patient's tolerance, Monitored during session, RN gave pain meds during session    Home Living                          Prior Function            PT Goals (current goals can now be found in the care plan section) Acute Rehab PT Goals Patient Stated Goal: to recover well PT Goal Formulation: With patient/family Time For Goal Achievement: 05/24/24 Potential to Achieve Goals: Good Progress towards PT goals: Progressing toward goals    Frequency    7X/week      PT Plan      Co-evaluation              AM-PAC PT 6 Clicks Mobility   Outcome Measure  Help needed turning from your back to your side while in a flat bed without using bedrails?: A Little Help needed moving from lying on your back to sitting on the side of a flat bed without using bedrails?: A  Little Help needed moving to and from a bed to a chair (including a wheelchair)?: A Little Help needed standing up from a chair using your arms (e.g., wheelchair or bedside chair)?: A Little Help needed to walk in hospital room?: A Little Help needed climbing 3-5 steps with a railing? : Total 6 Click Score: 16    End of Session   Activity Tolerance: Patient limited by pain Patient left: in bed;with call bell/phone within reach;with bed alarm set Nurse Communication: Mobility status PT Visit Diagnosis: Unsteadiness on feet (R26.81);Other abnormalities of gait and mobility (R26.89);Muscle weakness (generalized) (M62.81);Difficulty in walking, not elsewhere classified (R26.2);Pain Pain - Right/Left: Left Pain - part of body: Knee     Time: 8541-8471 PT Time Calculation (min) (ACUTE ONLY): 30 min  Charges:    $Therapeutic Exercise: 8-22 mins $Therapeutic Activity: 8-22 mins PT General Charges $$ ACUTE PT VISIT: 1 Visit                     Richerd Lipoma, PT  Acute Rehab Services Secure  chat preferred Office [phone]    Richerd CROME Alazae Crymes 05/20/2024, 3:41 PM

## 2024-05-20 NOTE — Progress Notes (Signed)
 Received patient in bed to unit.  Alert and oriented.  Informed consent signed and in chart.   TX duration 3:30  Patient tolerated well.  Transported back to the room  Alert, without acute distress.  Hand-off given to patient's nurse. Shift RN  Access used: AVF Access issues: None  Total UF removed: 1500 Medication(s) given: Morphine  4 mg IVP Post HD VS: T98.4-HR84-RR12-B/P135/67 Post HD weight: 77.2kg  Neville Seip, RN [facility]  05/20/24 2200  Vitals  Temp 98.4 F (36.9 C)  Temp Source Oral  BP 135/67  MAP (mmHg) 87  BP Location Right Leg  BP Method Automatic  Patient Position (if appropriate) Lying  Pulse Rate 84  Pulse Rate Source Monitor  ECG Heart Rate 84  Resp 12  Weight 77.2 kg  Type of Weight Post-Dialysis  Oxygen Therapy  SpO2 100 %  O2 Device Room Air  During Treatment Monitoring  Blood Flow Rate (mL/min) 0 mL/min  Arterial Pressure (mmHg) 33.33 mmHg  Venous Pressure (mmHg) -29.49 mmHg  TMP (mmHg) 15.15 mmHg  Ultrafiltration Rate (mL/min) 834 mL/min  Dialysate Flow Rate (mL/min) 300 ml/min  Dialysate Potassium Concentration 2  Dialysate Calcium  Concentration 2.5  Duration of HD Treatment -hour(s) 3.5 hour(s)  Cumulative Fluid Removed (mL) per Treatment  1500.21  HD Safety Checks Performed Yes  Intra-Hemodialysis Comments Tx completed  Post Treatment  Dialyzer Clearance Clear  Liters Processed 84  Fluid Removed (mL) 1500 mL  Tolerated HD Treatment Yes  AVG/AVF Arterial Site Held (minutes) 15 minutes  AVG/AVF Venous Site Held (minutes) 15 minutes     05/20/24 2200  Vitals  Temp 98.4 F (36.9 C)  Temp Source Oral  BP 135/67  MAP (mmHg) 87  BP Location Right Leg  BP Method Automatic  Patient Position (if appropriate) Lying  Pulse Rate 84  Pulse Rate Source Monitor  ECG Heart Rate 84  Resp 12  Weight 77.2 kg  Type of Weight Post-Dialysis  Oxygen Therapy  SpO2 100 %  O2 Device Room Air  During Treatment Monitoring   Blood Flow Rate (mL/min) 0 mL/min  Arterial Pressure (mmHg) 33.33 mmHg  Venous Pressure (mmHg) -29.49 mmHg  TMP (mmHg) 15.15 mmHg  Ultrafiltration Rate (mL/min) 834 mL/min  Dialysate Flow Rate (mL/min) 300 ml/min  Dialysate Potassium Concentration 2  Dialysate Calcium  Concentration 2.5  Duration of HD Treatment -hour(s) 3.5 hour(s)  Cumulative Fluid Removed (mL) per Treatment  1500.21  HD Safety Checks Performed Yes  Intra-Hemodialysis Comments Tx completed  Post Treatment  Dialyzer Clearance Clear  Liters Processed 84  Fluid Removed (mL) 1500 mL  Tolerated HD Treatment Yes  AVG/AVF Arterial Site Held (minutes) 15 minutes  AVG/AVF Venous Site Held (minutes) 15 minutes

## 2024-05-20 NOTE — Procedures (Signed)
 S: seen in [facility], no c/o's this evening.   Vitals:   05/20/24 1803 05/20/24 1807 05/20/24 1830 05/20/24 1900  BP: (!) 143/72 (!) 146/80 (!) 144/71 (!) 140/73  Pulse: 80 86 80 79  Resp: 16 13 12 15   Temp: 98 F (36.7 C)     TempSrc:      SpO2:  99% 98% 100%  Weight:  78.7 kg    Height:        Recent Labs  Lab 05/17/24 0945 05/20/24 1149  HGB 11.9* 10.6*  CALCIUM   --  7.0*  CREATININE 6.60* 11.40*  K 4.4 5.0    Inpatient medications:  apixaban  2.5 mg Oral Q12H   [START ON 05/21/2024] Chlorhexidine  Gluconate Cloth  6 each Topical Q0600   docusate sodium   100 mg Oral BID   pantoprazole   40 mg Oral Daily    lactated ringers  Stopped (05/20/24 0939)   acetaminophen , diphenhydrAMINE , menthol  **OR** phenol, methocarbamol  **OR** methocarbamol  (ROBAXIN ) injection, morphine  (PF), ondansetron  **OR** ondansetron  (ZOFRAN ) IV, oxyCODONE , polyethylene glycol  I was present at the procedure, reviewed the HD regimen and made appropriate changes.   Myer Fret MD  [facility] 05/20/2024, 7:57 PM

## 2024-05-20 NOTE — Plan of Care (Signed)
  Problem: Education: Goal: Knowledge of General Education information will improve Description: Including pain rating scale, medication(s)/side effects and non-pharmacologic comfort measures Outcome: Progressing   Problem: Health Behavior/Discharge Planning: Goal: Ability to manage health-related needs will improve Outcome: Progressing   Problem: Nutrition: Goal: Adequate nutrition will be maintained Outcome: Progressing   Problem: Elimination: Goal: Will not experience complications related to urinary retention Outcome: Progressing   Problem: Safety: Goal: Ability to remain free from injury will improve Outcome: Progressing   Problem: Skin Integrity: Goal: Risk for impaired skin integrity will decrease Outcome: Progressing   Problem: Skin Integrity: Goal: Will show signs of wound healing Outcome: Progressing

## 2024-05-20 NOTE — Progress Notes (Signed)
 Physical Therapy Treatment Patient Details Name: [name] MRN: [id number] DOB: 1985/11/26 Today's Date: 05/20/2024   History of Present Illness Pt is a 38 y.o. male who presented 05/16/24 for elective L lateral unicompartmental knee arthroplasty. PMH: aortic valve disease, arthritis, ESRD, GERD, hyperparathyroidism, HTN, sleep apnea, morbid obesity    PT Comments  Pt received in bed and has not been up yet. Reports that he has not had HD since Tuesday and was supposed to go yesterday but it didn't happen. Pt needed increased time and min A to come to EOB. Pt tolerating minimal ROM to L knee and L ankle and is still unable to get L heel to floor in standing. Would benefit from CPM machine to further promote L knee flexion. Ambulated 25' with RW and CGA. Pt has anterior knee pain and frequent cramping that is limiting his mobility. PT will continue to follow.      If plan is discharge home, recommend the following: A little help with walking and/or transfers;A little help with bathing/dressing/bathroom;Assistance with cooking/housework;Assist for transportation;Help with stairs or ramp for entrance   Can travel by private vehicle        Equipment Recommendations  Other (comment) (tub bench/shower chair)    Recommendations for Other Services OT consult     Precautions / Restrictions Precautions Precautions: Fall;Knee Recall of Precautions/Restrictions: Intact Precaution/Restrictions Comments: no pillow under knee Restrictions Weight Bearing Restrictions Per Provider Order: Yes LLE Weight Bearing Per Provider Order: Weight bearing as tolerated     Mobility  Bed Mobility Overal bed mobility: Needs Assistance Bed Mobility: Supine to Sit     Supine to sit: Min assist, HOB elevated     General bed mobility comments: min A to manage LLE fully off EOB with pt needing significantly increased time to self mobilize    Transfers Overall transfer level: Needs  assistance Equipment used: Rolling walker (2 wheels) Transfers: Sit to/from Stand Sit to Stand: Contact guard assist, From elevated surface           General transfer comment: pt standing from slightly elevated EOB with CGA for safety, good hand placement, increased time needed    Ambulation/Gait Ambulation/Gait assistance: Contact guard assist Gait Distance (Feet): 25 Feet Assistive device: Rolling walker (2 wheels) Gait Pattern/deviations: Step-to pattern, Decreased stance time - left, Decreased dorsiflexion - left, Decreased stride length, Decreased weight shift to left, Antalgic, Trunk flexed Gait velocity: very slowed Gait velocity interpretation: <1.31 ft/sec, indicative of household ambulator Pre-gait activities: standing wt shift to L with foot taps to attempt to stop cramping. Calf stretch in standing General Gait Details: Pt ambulated with only forefoot contact on ground with very limited weight bearing on LLE due to pain, heavy reliance on UE support on RW, CGA for safety with no overt LOB noted   Stairs             Wheelchair Mobility     Tilt Bed    Modified Rankin (Stroke Patients Only)       Balance Overall balance assessment: Needs assistance Sitting-balance support: No upper extremity supported, Feet supported Sitting balance-Leahy Scale: Good     Standing balance support: Bilateral upper extremity supported, During functional activity, Reliant on assistive device for balance Standing balance-Leahy Scale: Poor Standing balance comment: reliant on RW                            Communication Communication Communication: No apparent difficulties  Cognition Arousal: Alert Behavior During Therapy: WFL for tasks assessed/performed   PT - Cognitive impairments: No apparent impairments                         Following commands: Intact      Cueing Cueing Techniques: Verbal cues  Exercises Total Joint Exercises Ankle  Circles/Pumps: AROM, Both, 10 reps Quad Sets: AROM, Left, 10 reps, Seated Heel Slides: AAROM, Left, 5 reps, Supine Hip ABduction/ADduction: AAROM, Left, 5 reps, Supine Long Arc Quad: AAROM, Left, 5 reps, Seated Goniometric ROM: 15-40 Marching in Standing: AROM, Left, 10 reps, Standing Standing Hip Extension: AROM, Left, 10 reps, Standing    General Comments General comments (skin integrity, edema, etc.): education on L ankle ROM stretch. Pt tolerating very little ROM to LLE in general. Would benefit from a CPM      Pertinent Vitals/Pain Pain Assessment Pain Assessment: Faces Faces Pain Scale: Hurts whole lot Pain Location: L knee Pain Descriptors / Indicators: Discomfort, Grimacing, Guarding, Operative site guarding, Sore Pain Intervention(s): Limited activity within patient's tolerance, Monitored during session, Premedicated before session    Home Living                          Prior Function            PT Goals (current goals can now be found in the care plan section) Acute Rehab PT Goals Patient Stated Goal: to recover well PT Goal Formulation: With patient/family Time For Goal Achievement: 05/24/24 Potential to Achieve Goals: Good Progress towards PT goals: Progressing toward goals (very slowly)    Frequency    7X/week      PT Plan      Co-evaluation              AM-PAC PT 6 Clicks Mobility   Outcome Measure  Help needed turning from your back to your side while in a flat bed without using bedrails?: A Little Help needed moving from lying on your back to sitting on the side of a flat bed without using bedrails?: A Little Help needed moving to and from a bed to a chair (including a wheelchair)?: A Little Help needed standing up from a chair using your arms (e.g., wheelchair or bedside chair)?: A Little Help needed to walk in hospital room?: A Little Help needed climbing 3-5 steps with a railing? : Total 6 Click Score: 16    End of  Session Equipment Utilized During Treatment: Gait belt Activity Tolerance: Patient limited by pain Patient left: with call bell/phone within reach;in chair;with chair alarm set Nurse Communication: Mobility status PT Visit Diagnosis: Unsteadiness on feet (R26.81);Other abnormalities of gait and mobility (R26.89);Muscle weakness (generalized) (M62.81);Difficulty in walking, not elsewhere classified (R26.2);Pain Pain - Right/Left: Left Pain - part of body: Knee     Time: 9249-9165 PT Time Calculation (min) (ACUTE ONLY): 44 min  Charges:    $Gait Training: 8-22 mins $Therapeutic Exercise: 8-22 mins $Therapeutic Activity: 8-22 mins PT General Charges $$ ACUTE PT VISIT: 1 Visit                     Richerd Lipoma, PT  Acute Rehab Services Secure chat preferred Office [phone]    Richerd CROME Faust Thorington 05/20/2024, 10:29 AM

## 2024-05-21 DIAGNOSIS — I120 Hypertensive chronic kidney disease with stage 5 chronic kidney disease or end stage renal disease: Secondary | ICD-10-CM | POA: Diagnosis present

## 2024-05-21 DIAGNOSIS — K219 Gastro-esophageal reflux disease without esophagitis: Secondary | ICD-10-CM | POA: Diagnosis present

## 2024-05-21 DIAGNOSIS — Z87891 Personal history of nicotine dependence: Secondary | ICD-10-CM | POA: Diagnosis not present

## 2024-05-21 DIAGNOSIS — G4733 Obstructive sleep apnea (adult) (pediatric): Secondary | ICD-10-CM | POA: Diagnosis present

## 2024-05-21 DIAGNOSIS — M159 Polyosteoarthritis, unspecified: Secondary | ICD-10-CM | POA: Diagnosis present

## 2024-05-21 DIAGNOSIS — Z7901 Long term (current) use of anticoagulants: Secondary | ICD-10-CM | POA: Diagnosis not present

## 2024-05-21 DIAGNOSIS — N186 End stage renal disease: Secondary | ICD-10-CM | POA: Diagnosis present

## 2024-05-21 DIAGNOSIS — N2581 Secondary hyperparathyroidism of renal origin: Secondary | ICD-10-CM | POA: Diagnosis present

## 2024-05-21 DIAGNOSIS — Z992 Dependence on renal dialysis: Secondary | ICD-10-CM | POA: Diagnosis not present

## 2024-05-21 DIAGNOSIS — Z8249 Family history of ischemic heart disease and other diseases of the circulatory system: Secondary | ICD-10-CM | POA: Diagnosis not present

## 2024-05-21 DIAGNOSIS — Z833 Family history of diabetes mellitus: Secondary | ICD-10-CM | POA: Diagnosis not present

## 2024-05-21 DIAGNOSIS — Z96652 Presence of left artificial knee joint: Secondary | ICD-10-CM | POA: Diagnosis present

## 2024-05-21 DIAGNOSIS — I350 Nonrheumatic aortic (valve) stenosis: Secondary | ICD-10-CM | POA: Diagnosis present

## 2024-05-21 DIAGNOSIS — K760 Fatty (change of) liver, not elsewhere classified: Secondary | ICD-10-CM | POA: Diagnosis present

## 2024-05-21 DIAGNOSIS — E669 Obesity, unspecified: Secondary | ICD-10-CM | POA: Diagnosis present

## 2024-05-21 DIAGNOSIS — Z96653 Presence of artificial knee joint, bilateral: Secondary | ICD-10-CM | POA: Diagnosis present

## 2024-05-21 DIAGNOSIS — D631 Anemia in chronic kidney disease: Secondary | ICD-10-CM | POA: Diagnosis present

## 2024-05-21 DIAGNOSIS — M62838 Other muscle spasm: Secondary | ICD-10-CM | POA: Diagnosis not present

## 2024-05-21 DIAGNOSIS — M1712 Unilateral primary osteoarthritis, left knee: Secondary | ICD-10-CM | POA: Diagnosis present

## 2024-05-21 LAB — HEPATITIS B SURFACE ANTIGEN: Hepatitis B Surface Ag: NONREACTIVE

## 2024-05-21 MED ORDER — CHLORHEXIDINE GLUCONATE CLOTH 2 % EX PADS
6.0000 | MEDICATED_PAD | Freq: Every day | CUTANEOUS | Status: DC
  Administered 2024-05-21 – 2024-05-22 (×2): 6 via TOPICAL

## 2024-05-21 NOTE — Progress Notes (Signed)
  [name] Progress Note   Subjective:   Patient seen and examined at bedside.  Reports pain, swelling and spasms in L knee.  States he is having trouble ambulating.  Concerned about going home if he can not ambulate.  States after his last knee surgery he was able to walk that day.  Discouraged by current difficultly.  Mostly tolerated dialysis well except for LLE spasms.  Feels like he still has a little fluid on.  Denies CP, SOB, abdominal pain and n/v/d.   Objective Vitals:   05/20/24 2130 05/20/24 2200 05/20/24 2321 05/21/24 0841  BP: 119/64 135/67 130/66 138/66  Pulse: 76 84 88 84  Resp: 11 12 17 18   Temp:  98.4 F (36.9 C) 98.8 F (37.1 C) 99.8 F (37.7 C)  TempSrc:  Oral Oral Oral  SpO2: 99% 100% 100% 100%  Weight:  77.2 kg    Height:       Physical Exam General:alert male in NAD, obvious discomfort  Heart:RRR Lungs:nml WOB on RA Abdomen:non distended Extremities:L knee swelling,1+ LLE edema, no RLE edema Dialysis Access: AVF +b   Filed Weights   05/17/24 0912 05/20/24 1807 05/20/24 2200  Weight: 74.8 kg 78.7 kg 77.2 kg    Intake/Output Summary (Last 24 hours) at 05/21/2024 1056 Last data filed at 05/20/2024 2200 Gross per 24 hour  Intake 120 ml  Output 1500 ml  Net -1380 ml    Additional Objective Labs: Basic Metabolic Panel: Recent Labs  Lab 05/17/24 0945 05/20/24 1149  NA 137 134*  K 4.4 5.0  CL 100 96*  CO2  --  23  GLUCOSE 71 101*  BUN 18 52*  CREATININE 6.60* 11.40*  CALCIUM   --  7.0*   CBC: Recent Labs  Lab 05/17/24 0945 05/20/24 1149  WBC  --  5.5  HGB 11.9* 10.6*  HCT 35.0* 31.6*  MCV  --  91.9  PLT  --  159    Medications:  lactated ringers  Stopped (05/20/24 0939)    apixaban  2.5 mg Oral Q12H   Chlorhexidine  Gluconate Cloth  6 each Topical Q0600   docusate sodium   100 mg Oral BID   pantoprazole   40 mg Oral Daily    Dialysis Orders: MWF [facility] 4h  B450   74.4kg   3K   AVF  Heparin  10,000 Last HD  mon/tues, post wts 74/ 74.4 Good compliance, gets to dry wt Mircera 50 mcg q 4 wks, last 10/14       Assessment/ Plan: S/P L lateral UKA (knee surgery) : done 10/15 per orthopedics. Ongoing pain and difficultly ambulating. PT to see him today.  ESRD: on HD MWF. HD tomorrow (10/20) 1st shift incase able to discharge.  HTN: bp's stable, not on BP lowering meds at home Volume: 3kg over dry post HD yesterday if weights correct.  Plan for UF 3L with HD tomorrow.  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 Secondary hyperparathyroidism - Corrected Ca a little low.  Use increased Ca bath. Check phos. Continue home meds Nutrition - renal diet w/fluid restrictions  Manuelita Labella, [name] [facility] 05/21/2024,10:56 AM  LOS: 0 days

## 2024-05-21 NOTE — Progress Notes (Signed)
 Subjective: Patient reports pain as severe. Very uncomfortable. Having a lot of muscle spasms. Also having low back/buttock pain now from sitting in the chair too long. Tolerating diet.  Urinating.   No CP, SOB.  Continues to work on mobilizing OOB with PT/OT. Better session today after pain meds.   Did not get HD Friday like usual. Finally got HD last night. Notes from nephrology indicate he is scheduled to get next treatment tomorrow morning to get back on regular schedule of MWF.   I had to change his order status from OBS to admit.   Objective:   VITALS:   Vitals:   05/20/24 2130 05/20/24 2200 05/20/24 2321 05/21/24 0841  BP: 119/64 135/67 130/66 138/66  Pulse: 76 84 88 84  Resp: 11 12 17 18   Temp:  98.4 F (36.9 C) 98.8 F (37.1 C) 99.8 F (37.7 C)  TempSrc:  Oral Oral Oral  SpO2: 99% 100% 100% 100%  Weight:  77.2 kg    Height:          Latest Ref Rng & Units 05/20/2024   11:49 AM 05/17/2024    9:45 AM 05/09/2024    8:28 AM  CBC  WBC 4.0 - 10.5 K/uL 5.5   5.1   Hemoglobin 13.0 - 17.0 g/dL 89.3  88.0  88.8   Hematocrit 39.0 - 52.0 % 31.6  35.0  35.8   Platelets 150 - 400 K/uL 159   140       Latest Ref Rng & Units 05/20/2024   11:49 AM 05/17/2024    9:45 AM 05/09/2024    8:28 AM  BMP  Glucose 70 - 99 mg/dL 898  71  70   BUN 6 - 20 mg/dL 52  18  21   Creatinine 0.61 - 1.24 mg/dL 88.59  3.39  2.06   Sodium 135 - 145 mmol/L 134  137  139   Potassium 3.5 - 5.1 mmol/L 5.0  4.4  4.4   Chloride 98 - 111 mmol/L 96  100  105   CO2 22 - 32 mmol/L 23   23   Calcium  8.9 - 10.3 mg/dL 7.0   8.0    Intake/Output      10/18 0701 10/19 0700 10/19 0701 10/20 0700   P.O. 120    Total Intake(mL/kg) 120 (1.6)    Other 1500    Total Output 1500    Net -1380            Physical Exam: General: NAD.  Sitting up in chair, calm, obvious discomfort Resp: No increased wob Cardio: regular rate and rhythm ABD soft Neurologically intact MSK Neurovascularly  intact Sensation intact distally Intact pulses distally Dorsiflexion/Plantar flexion intact Incision: dressing C/D/I Leg very swollen especially around the knee. Warmth present. No erythema   Assessment: 4 Days Post-Op  S/P Procedure(s) (LRB): ARTHROPLASTY, KNEE, UNICOMPARTMENTAL (Left) by Dr. Edna on 05/17/24  Principal Problem:   Primary osteoarthritis of left knee   Plan: Continue to work on pain control, increasing confidence, mobilizing   Up with therapy Incentive Spirometry Elevate leg and Apply ice to help with swelling  Weightbearing: WBAT LLE Insicional and dressing care: Reinforce dressings as needed Orthopedic device(s): None Showering: Keep dressing dry VTE prophylaxis: Eliquis 2.5mg  bid , SCDs, ambulation Pain control: PRN Follow - up plan: 2 George Contact information for today:  Evalene Chancy MD, Gerard Large [name]  Dispo: Home hopefully tomorrow after dialysis although he is nervous now since his  pain is high today. May need HHPT if unable to get to OPPT like originally planned.     Gerard CHRISTELLA Large, [name] Office [phone] 05/21/2024, 2:19 PM

## 2024-05-21 NOTE — Progress Notes (Signed)
 PT Cancellation Note  Patient Details Name: [name] MRN: [id number] DOB: Mar 06, 1986   Cancelled Treatment:    Reason Eval/Treat Not Completed: Other (comment)  Will come back for am PT session as pt in a lot of pain, receiving pain meds now;  Applied Ice to L knee;   Silvano Currier, PT  Acute Rehabilitation Services Office [phone] Secure Chat welcomed    Silvano VEAR Currier 05/21/2024, 9:07 AM

## 2024-05-21 NOTE — Progress Notes (Signed)
 Physical Therapy Treatment Patient Details Name: [name] MRN: [id number] DOB: [birth year] Today's Date: 05/21/2024   History of Present Illness Pt is a 38 y.o. male who presented 05/16/24 for elective L lateral unicompartmental knee arthroplasty. PMH: aortic valve disease, arthritis, ESRD, GERD, hyperparathyroidism, HTN, sleep apnea, morbid obesity    PT Comments  Continuing work on functional mobility and activity tolerance;  Noting less need for physical assist this session, with this PT intentionally holding back to allow for time and space for Zell to problem-solve through tasks; Noting increased step lengths, and better ability to get L heel to the floor; He was able to get into and out of the bed with the strap and without physical assist; he hopes to get home tomorrow; Likely one of the biggest challenges will be to get a workable pain medication regimen for him at home, in addition to getting that knee fully extended, with more quad control;   Anticipate HD will take him early tomorrow -- will make every effort to see pt for at least one (hopefully 2) PT sessions before he discharges   If plan is discharge home, recommend the following: A little help with walking and/or transfers;A little help with bathing/dressing/bathroom;Assistance with cooking/housework;Assist for transportation;Help with stairs or ramp for entrance   Can travel by private vehicle        Equipment Recommendations  Other (comment) (tub bench)    Recommendations for Other Services       Precautions / Restrictions Precautions Precautions: Fall;Knee Recall of Precautions/Restrictions: Intact Precaution/Restrictions Comments: no pillow under knee Restrictions LLE Weight Bearing Per Provider Order: Weight bearing as tolerated     Mobility  Bed Mobility Overal bed mobility: Needs Assistance Bed Mobility: Supine to Sit     Supine to sit: Min assist     General bed mobility comments: worked on bed  mobility to/from EOB having pt use strap to assist with LLE but still needed min A at times due to pain    Transfers Overall transfer level: Needs assistance Equipment used: Rolling walker (2 wheels) Transfers: Sit to/from Stand Sit to Stand: Contact guard assist, From elevated surface           General transfer comment: Shows carryover for postioning from previous session; slow rise, but no physical assist needed    Ambulation/Gait Ambulation/Gait assistance: Contact guard assist Gait Distance (Feet): 60 Feet Assistive device: Rolling walker (2 wheels) Gait Pattern/deviations: Step-to pattern, Decreased stance time - left, Decreased dorsiflexion - left, Decreased stride length, Decreased weight shift to left, Antalgic, Trunk flexed Gait velocity: very slowed     General Gait Details: Pt ambulated with only forefoot contact on ground with very limited weight bearing on LLE due to pain, heavy reliance on UE support on RW, CGA for safety with no overt LOB noted; able to get heel to floor a few steps during walking practice; emerging step-through pattern   Stairs             Wheelchair Mobility     Tilt Bed    Modified Rankin (Stroke Patients Only)       Balance Overall balance assessment: Needs assistance Sitting-balance support: No upper extremity supported, Feet supported Sitting balance-Leahy Scale: Good       Standing balance-Leahy Scale: Poor Standing balance comment: reliant on RW                            Communication Communication Communication:  No apparent difficulties  Cognition Arousal: Alert Behavior During Therapy: WFL for tasks assessed/performed   PT - Cognitive impairments: No apparent impairments                       PT - Cognition Comments: gets anxious at times Following commands: Intact      Cueing Cueing Techniques: Verbal cues  Exercises Total Joint Exercises Ankle Circles/Pumps: AROM, Both, 10  reps Quad Sets: AROM, Left, 10 reps, Supine Short Arc Quad: AAROM, Left, 5 reps, Supine Heel Slides: AAROM, Left, 10 reps, Supine (self-aarom with strap) Goniometric ROM: 10-50 Other Exercises Other Exercises: heel cord stretch with strap x 60 secs    General Comments        Pertinent Vitals/Pain Pain Assessment Pain Assessment: Faces Pain Score: 7  Faces Pain Scale: Hurts little more Pain Location: L knee Pain Descriptors / Indicators: Discomfort, Grimacing, Guarding, Operative site guarding, Sore Pain Intervention(s): Monitored during session    Home Living                          Prior Function            PT Goals (current goals can now be found in the care plan section) Acute Rehab PT Goals Patient Stated Goal: to recover well PT Goal Formulation: With patient/family Time For Goal Achievement: 05/24/24 Potential to Achieve Goals: Good Progress towards PT goals: Progressing toward goals    Frequency    7X/week      PT Plan      Co-evaluation              AM-PAC PT 6 Clicks Mobility   Outcome Measure  Help needed turning from your back to your side while in a flat bed without using bedrails?: A Little Help needed moving from lying on your back to sitting on the side of a flat bed without using bedrails?: A Little Help needed moving to and from a bed to a chair (including a wheelchair)?: A Little Help needed standing up from a chair using your arms (e.g., wheelchair or bedside chair)?: A Little Help needed to walk in hospital room?: A Little Help needed climbing 3-5 steps with a railing? : Total 6 Click Score: 16    End of Session Equipment Utilized During Treatment: Gait belt Activity Tolerance: Patient limited by pain Patient left: in chair;with call bell/phone within reach;with chair alarm set Nurse Communication: Mobility status PT Visit Diagnosis: Unsteadiness on feet (R26.81);Other abnormalities of gait and mobility  (R26.89);Muscle weakness (generalized) (M62.81);Difficulty in walking, not elsewhere classified (R26.2);Pain Pain - Right/Left: Left Pain - part of body: Knee     Time: 8375-8284 PT Time Calculation (min) (ACUTE ONLY): 51 min  Charges:    $Gait Training: 8-22 mins $Therapeutic Exercise: 8-22 mins $Therapeutic Activity: 8-22 mins PT General Charges $$ ACUTE PT VISIT: 1 Visit                     Silvano Currier, PT  Acute Rehabilitation Services Office [phone] Secure Chat welcomed    Silvano VEAR Currier 05/21/2024, 6:59 PM

## 2024-05-21 NOTE — Plan of Care (Signed)
  Problem: Education: Goal: Knowledge of General Education information will improve Description: Including pain rating scale, medication(s)/side effects and non-pharmacologic comfort measures Outcome: Progressing   Problem: Clinical Measurements: Goal: Ability to maintain clinical measurements within normal limits will improve Outcome: Progressing Goal: Will remain free from infection Outcome: Progressing Goal: Respiratory complications will improve Outcome: Progressing   Problem: Activity: Goal: Risk for activity intolerance will decrease Outcome: Progressing   Problem: Nutrition: Goal: Adequate nutrition will be maintained Outcome: Progressing   Problem: Coping: Goal: Level of anxiety will decrease Outcome: Progressing   Problem: Elimination: Goal: Will not experience complications related to urinary retention Outcome: Progressing   Problem: Pain Managment: Goal: General experience of comfort will improve and/or be controlled Outcome: Progressing   Problem: Safety: Goal: Ability to remain free from injury will improve Outcome: Progressing   Problem: Skin Integrity: Goal: Risk for impaired skin integrity will decrease Outcome: Progressing   Problem: Education: Goal: Knowledge of the prescribed therapeutic regimen will improve Outcome: Progressing Goal: Individualized Educational Video(s) Outcome: Progressing   Problem: Activity: Goal: Ability to avoid complications of mobility impairment will improve Outcome: Progressing Goal: Range of joint motion will improve Outcome: Progressing   Problem: Clinical Measurements: Goal: Postoperative complications will be avoided or minimized Outcome: Progressing   Problem: Pain Management: Goal: Pain level will decrease with appropriate interventions Outcome: Progressing   Problem: Skin Integrity: Goal: Will show signs of wound healing Outcome: Progressing

## 2024-05-21 NOTE — Progress Notes (Signed)
 Physical Therapy Treatment Patient Details Name: [name] MRN: [id number] DOB: November 15, 1985 Today's Date: 05/21/2024   History of Present Illness Pt is a 38 y.o. male who presented 05/16/24 for elective L lateral unicompartmental knee arthroplasty. PMH: aortic valve disease, arthritis, ESRD, GERD, hyperparathyroidism, HTN, sleep apnea, morbid obesity    PT Comments  Continuing work on functional mobility and activity tolerance;  Session focused on functional mobility and progressive amb with noted incr distance compared to most recent PT sessions; pain still limits pt, but even with the pain, he only needed minimal assist for bed mobility moving his RLE across the bed to the edge; sit<>stand and ambulation were with CGA for safety; Pt anticipates getting home tomorrow, and that is reasonable -- it is an HD day tomorrow   If plan is discharge home, recommend the following: A little help with walking and/or transfers;A little help with bathing/dressing/bathroom;Assistance with cooking/housework;Assist for transportation;Help with stairs or ramp for entrance   Can travel by private vehicle        Equipment Recommendations  Other (comment) (tub bench)    Recommendations for Other Services       Precautions / Restrictions Precautions Precautions: Fall;Knee Recall of Precautions/Restrictions: Intact Precaution/Restrictions Comments: no pillow under knee Restrictions LLE Weight Bearing Per Provider Order: Weight bearing as tolerated     Mobility  Bed Mobility Overal bed mobility: Needs Assistance Bed Mobility: Supine to Sit     Supine to sit: Min assist     General bed mobility comments: worked on bed mobility to/from EOB having pt use strap to assist with LLE but still needed min A at times due to pain    Transfers Overall transfer level: Needs assistance Equipment used: Rolling walker (2 wheels) Transfers: Sit to/from Stand Sit to Stand: Contact guard assist, From  elevated surface           General transfer comment: Shows carryover for postioning from previous session; slow rise, but no physical assist needed    Ambulation/Gait Ambulation/Gait assistance: Contact guard assist Gait Distance (Feet): 60 Feet Assistive device: Rolling walker (2 wheels) Gait Pattern/deviations: Step-to pattern, Decreased stance time - left, Decreased dorsiflexion - left, Decreased stride length, Decreased weight shift to left, Antalgic, Trunk flexed Gait velocity: very slowed     General Gait Details: Pt ambulated with only forefoot contact on ground with very limited weight bearing on LLE due to pain, heavy reliance on UE support on RW, CGA for safety with no overt LOB noted; able to get heel to floor a few steps during walking practice   Stairs             Wheelchair Mobility     Tilt Bed    Modified Rankin (Stroke Patients Only)       Balance Overall balance assessment: Needs assistance Sitting-balance support: No upper extremity supported, Feet supported Sitting balance-Leahy Scale: Good       Standing balance-Leahy Scale: Poor Standing balance comment: reliant on RW                            Communication Communication Communication: No apparent difficulties  Cognition Arousal: Alert Behavior During Therapy: WFL for tasks assessed/performed   PT - Cognitive impairments: No apparent impairments                       PT - Cognition Comments: gets anxious at times Following commands: Intact  Cueing Cueing Techniques: Verbal cues  Exercises      General Comments        Pertinent Vitals/Pain Pain Assessment Pain Assessment: 0-10 Pain Score: 7  Pain Location: L knee Pain Descriptors / Indicators: Discomfort, Grimacing, Guarding, Operative site guarding, Sore Pain Intervention(s): Monitored during session, Premedicated before session    Home Living                          Prior  Function            PT Goals (current goals can now be found in the care plan section) Acute Rehab PT Goals Patient Stated Goal: to recover well PT Goal Formulation: With patient/family Time For Goal Achievement: 05/24/24 Potential to Achieve Goals: Good Progress towards PT goals: Progressing toward goals    Frequency    7X/week      PT Plan      Co-evaluation              AM-PAC PT 6 Clicks Mobility   Outcome Measure  Help needed turning from your back to your side while in a flat bed without using bedrails?: A Little Help needed moving from lying on your back to sitting on the side of a flat bed without using bedrails?: A Little Help needed moving to and from a bed to a chair (including a wheelchair)?: A Little Help needed standing up from a chair using your arms (e.g., wheelchair or bedside chair)?: A Little Help needed to walk in hospital room?: A Little Help needed climbing 3-5 steps with a railing? : Total 6 Click Score: 16    End of Session Equipment Utilized During Treatment: Gait belt Activity Tolerance: Patient limited by pain Patient left: in chair;with call bell/phone within reach;with chair alarm set Nurse Communication: Mobility status PT Visit Diagnosis: Unsteadiness on feet (R26.81);Other abnormalities of gait and mobility (R26.89);Muscle weakness (generalized) (M62.81);Difficulty in walking, not elsewhere classified (R26.2);Pain Pain - Right/Left: Left Pain - part of body: Knee     Time: 1022-1100 PT Time Calculation (min) (ACUTE ONLY): 38 min  Charges:    $Gait Training: 23-37 mins $Therapeutic Activity: 8-22 mins PT General Charges $$ ACUTE PT VISIT: 1 Visit                     Silvano Currier, PT  Acute Rehabilitation Services Office [phone] Secure Chat welcomed    Silvano VEAR Currier 05/21/2024, 1:47 PM

## 2024-05-22 LAB — RENAL FUNCTION PANEL
Albumin: 2.3 g/dL — ABNORMAL LOW (ref 3.5–5.0)
Anion gap: 14 (ref 5–15)
BUN: 48 mg/dL — ABNORMAL HIGH (ref 6–20)
CO2: 22 mmol/L (ref 22–32)
Calcium: 6.3 mg/dL — CL (ref 8.9–10.3)
Chloride: 96 mmol/L — ABNORMAL LOW (ref 98–111)
Creatinine, Ser: 8.44 mg/dL — ABNORMAL HIGH (ref 0.61–1.24)
GFR, Estimated: 8 mL/min — ABNORMAL LOW (ref >60)
Glucose, Bld: 108 mg/dL — ABNORMAL HIGH (ref 70–99)
Phosphorus: 3.3 mg/dL (ref 2.5–4.6)
Potassium: 4.5 mmol/L (ref 3.5–5.1)
Sodium: 132 mmol/L — ABNORMAL LOW (ref 135–145)

## 2024-05-22 LAB — CBC
HCT: 27.8 % — ABNORMAL LOW (ref 39.0–52.0)
Hemoglobin: 9.5 g/dL — ABNORMAL LOW (ref 13.0–17.0)
MCH: 31.3 pg (ref 26.0–34.0)
MCHC: 34.2 g/dL (ref 30.0–36.0)
MCV: 91.4 fL (ref 80.0–100.0)
Platelets: 167 K/uL (ref 150–400)
RBC: 3.04 MIL/uL — ABNORMAL LOW (ref 4.22–5.81)
RDW: 13.7 % (ref 11.5–15.5)
WBC: 5 K/uL (ref 4.0–10.5)
nRBC: 0 % (ref 0.0–0.2)

## 2024-05-22 MED ORDER — LIDOCAINE-PRILOCAINE 2.5-2.5 % EX CREA: 1.0000 | TOPICAL_CREAM | CUTANEOUS | Status: DC | PRN

## 2024-05-22 MED ORDER — OXYCODONE HCL 5 MG PO TABS: 5.0000 mg | ORAL_TABLET | ORAL | 0 refills | Status: AC | PRN

## 2024-05-22 MED ORDER — OXYCODONE HCL 5 MG PO TABS
ORAL_TABLET | ORAL | Status: AC
  Filled 2024-05-22: qty 3

## 2024-05-22 MED ORDER — ACETAMINOPHEN 500 MG PO TABS
1000.0000 mg | ORAL_TABLET | Freq: Three times a day (TID) | ORAL | Status: DC
  Administered 2024-05-22 (×2): 1000 mg via ORAL
  Filled 2024-05-22 (×2): qty 2

## 2024-05-22 MED ORDER — HEPARIN SODIUM (PORCINE) 1000 UNIT/ML DIALYSIS: 1000.0000 [IU] | INTRAMUSCULAR | Status: DC | PRN

## 2024-05-22 MED ORDER — ALTEPLASE 2 MG IJ SOLR: 2.0000 mg | Freq: Once | INTRAMUSCULAR | Status: DC | PRN

## 2024-05-22 MED ORDER — MORPHINE SULFATE (PF) 4 MG/ML IV SOLN: 2.0000 mg | Freq: Four times a day (QID) | INTRAVENOUS | Status: DC | PRN

## 2024-05-22 MED ORDER — METHOCARBAMOL 750 MG PO TABS
750.0000 mg | ORAL_TABLET | Freq: Four times a day (QID) | ORAL | Status: DC | PRN
  Administered 2024-05-22: 750 mg via ORAL
  Filled 2024-05-22: qty 1

## 2024-05-22 MED ORDER — POLYETHYLENE GLYCOL 3350 17 G PO PACK
17.0000 g | PACK | Freq: Every day | ORAL | Status: DC
  Filled 2024-05-22: qty 1

## 2024-05-22 MED ORDER — METHOCARBAMOL 1000 MG/10ML IJ SOLN: 500.0000 mg | Freq: Four times a day (QID) | INTRAMUSCULAR | Status: DC | PRN

## 2024-05-22 MED ORDER — MORPHINE SULFATE (PF) 2 MG/ML IV SOLN
INTRAVENOUS | Status: AC
  Filled 2024-05-22: qty 1

## 2024-05-22 MED ORDER — PENTAFLUOROPROP-TETRAFLUOROETH EX AERO: 1.0000 | INHALATION_SPRAY | CUTANEOUS | Status: DC | PRN

## 2024-05-22 MED ORDER — ANTICOAGULANT SODIUM CITRATE 4% (200MG/5ML) IV SOLN: 5.0000 mL | Status: DC | PRN

## 2024-05-22 MED ORDER — LIDOCAINE HCL (PF) 1 % IJ SOLN: 5.0000 mL | INTRAMUSCULAR | Status: DC | PRN

## 2024-05-22 MED ORDER — METHOCARBAMOL 500 MG PO TABS: 750.0000 mg | ORAL_TABLET | Freq: Three times a day (TID) | ORAL | 0 refills | Status: AC | PRN

## 2024-05-22 NOTE — Progress Notes (Signed)
     Subjective: Patient still having trouble with muscle spasm and working on full knee extension.  Encouraged him to use the blue foam pillow 10 to 15 minutes couple times per day.  Also I put him on scheduled Tylenol  as he is not having any since Thursday as well as increased his Robaxin  to 750mg .  Hopeful this will help with his pain management.    plan for dialysis and physical therapy today.  Hopeful for discharge home today.  Yesterday's total administered Morphine  Milligram Equivalents: 114   Objective:   VITALS:   Vitals:   05/21/24 0841 05/21/24 1743 05/21/24 2020 05/22/24 0436  BP: 138/66 131/69 116/81 116/80  Pulse: 84 85 93 91  Resp: 18 18 17 16   Temp: 99.8 F (37.7 C)  98.5 F (36.9 C) 97.7 F (36.5 C)  TempSrc: Oral   Oral  SpO2: 100% 100% 100% 100%  Weight:      Height:        Sensation intact distally Intact pulses distally Dorsiflexion/Plantar flexion intact Incision: dressing C/D/I Compartment soft    Lab Results  Component Value Date   WBC 5.5 05/20/2024   HGB 10.6 (L) 05/20/2024   HCT 31.6 (L) 05/20/2024   MCV 91.9 05/20/2024   PLT 159 05/20/2024   BMET    Component Value Date/Time   NA 134 (L) 05/20/2024 1149   K 5.0 05/20/2024 1149   CL 96 (L) 05/20/2024 1149   CO2 23 05/20/2024 1149   GLUCOSE 101 (H) 05/20/2024 1149   BUN 52 (H) 05/20/2024 1149   CREATININE 11.40 (H) 05/20/2024 1149   CREATININE 5.63 (H) 03/22/2023 0432   CALCIUM  7.0 (L) 05/20/2024 1149   EGFR 12 (L) 03/22/2023 0432   GFRNONAA 5 (L) 05/20/2024 1149      Xray: latral UKA components in good alignment.  Assessment/Plan: 5 Days Post-Op   Principal Problem:   Primary osteoarthritis of left knee Active Problems:   History of prosthetic unicompartmental arthroplasty of left knee  S/p L lateral UKA 05/17/24   Post op recs: WB: WBAT Abx: ancef  periop Imaging: PACU xrays DVT prophylaxis: Eliquis twice daily starting postop day 1 for DVT prophylaxis follow up:  2 Urbanczyk after surgery for a wound check with Dr. Edna at [facility].  Address: [address], [place], [place] [address]  Office Phone: [phone]    TORIBIO DELENA EDNA 05/22/2024, 7:30 AM   TORIBIO Edna, MD  Contact information:   [phone] 7am-5pm epic message Dr. Edna, or call office for patient follow up: [phone] After hours and holidays please check [email or web address] for group call information for [facility]

## 2024-05-22 NOTE — Progress Notes (Signed)
 PT Cancellation Note  Patient Details Name: [name] MRN: [id number] DOB: Dec 05, 1985   Cancelled Treatment:    Reason Eval/Treat Not Completed: (P) Patient at procedure or test/unavailable, pt off unit at HD. Will check back as schedule allows to continue with PT POC.  Therisa SAUNDERS. PTA Acute Rehabilitation Services Office: [phone]    [name] 05/22/2024, 10:36 AM

## 2024-05-22 NOTE — Discharge Summary (Signed)
 Physician Discharge Summary  Patient ID: [name] MRN: [id number] DOB/AGE: August 20, 1985 38 y.o.  Admit date: 05/17/2024 Discharge date: 05/22/2024  Admission Diagnoses:  Primary osteoarthritis of left knee  Discharge Diagnoses:  Principal Problem:   Primary osteoarthritis of left knee Active Problems:   History of prosthetic unicompartmental arthroplasty of left knee   Past Medical History:  Diagnosis Date   Aortic valve disease    Possible bicuspid aortic valve with mild aortic valve stenosis.  Mean aortic valve gradient 7.6 mmHg with DVI 0.55 and SVI from 51.  Aortic valve number of cusps indeterminate but question bicuspid with calcification by echo 02/2024   Arthritis    all over (11/19/2016)   Chronic lower back pain    Complication of anesthesia    A little while to wake up after knee surgery in 2008   Dizziness    when coming off of dialysis   ESRD (end stage renal disease) on dialysis [facility])    MWF and goes to [facility] (11/19/2016)   Family history of anesthesia complication    mom slow to wake up   GERD (gastroesophageal reflux disease)    takes Omeprazole  as needed   Headache(784.0)    monthly (11/19/2016)   History of hiatal hernia    Hyperparathyroidism    Hypertension    Joint pain    Joint swelling    Morbid obesity (HCC)    PONV (postoperative nausea and vomiting)    Sleep apnea    Thyroid  disease     Surgeries: Procedure(s): ARTHROPLASTY, KNEE, UNICOMPARTMENTAL on 05/17/2024   Consultants (if any): Treatment Team:  Geralynn Charleston, MD  Discharged Condition: Improved  Hospital Course: [name] is an 38 y.o. male who was admitted 05/17/2024 with a diagnosis of Primary osteoarthritis of left knee and went to the operating room on 05/17/2024 and underwent the above named procedures.    He was given perioperative antibiotics:  Anti-infectives (From admission, onward)    Start     Dose/Rate Route Frequency Ordered Stop   05/17/24  1445  ceFAZolin  (ANCEF ) IVPB 2g/100 mL premix  Status:  Discontinued        2 g 200 mL/hr over 30 Minutes Intravenous Every 6 hours 05/17/24 1444 05/17/24 1615   05/17/24 0915  ceFAZolin  (ANCEF ) IVPB 2g/100 mL premix        2 g 200 mL/hr over 30 Minutes Intravenous On call to O.R. 05/17/24 9096 05/17/24 1144     .  He was given sequential compression devices, early ambulation, and eliquis for DVT prophylaxis.  He benefited maximally from the hospital stay and there were no complications.    Recent vital signs:  Vitals:   05/21/24 2020 05/22/24 0436  BP: 116/81 116/80  Pulse: 93 91  Resp: 17 16  Temp: 98.5 F (36.9 C) 97.7 F (36.5 C)  SpO2: 100% 100%    Recent laboratory studies:  Lab Results  Component Value Date   HGB 10.6 (L) 05/20/2024   HGB 11.9 (L) 05/17/2024   HGB 11.1 (L) 05/09/2024   Lab Results  Component Value Date   WBC 5.5 05/20/2024   PLT 159 05/20/2024   Lab Results  Component Value Date   INR 1.12 08/14/2016   Lab Results  Component Value Date   NA 134 (L) 05/20/2024   K 5.0 05/20/2024   CL 96 (L) 05/20/2024   CO2 23 05/20/2024   BUN 52 (H) 05/20/2024   CREATININE 11.40 (H) 05/20/2024  GLUCOSE 101 (H) 05/20/2024    Discharge Medications:   Allergies as of 05/22/2024       Reactions   Dilaudid  [hydromorphone  Hcl] Other (See Comments)   Patient became hypoxic to 60% with dilaudid  1mg  IV   Amlodipine Other (See Comments)   Ascorbate Itching   Midodrine Itching   Nsaids    Gastric bypass patient should avoid NSAIDs        Medication List     STOP taking these medications    HYDROcodone -acetaminophen  5-325 MG tablet Commonly known as: NORCO/VICODIN   traMADol  50 MG tablet Commonly known as: ULTRAM        TAKE these medications    acetaminophen  500 MG tablet Commonly known as: TYLENOL  Take 2 tablets (1,000 mg total) by mouth every 8 (eight) hours as needed. What changed:  how much to take when to take this reasons to  take this   albuterol  108 (90 Base) MCG/ACT inhaler Commonly known as: VENTOLIN  HFA Inhale 2 puffs into the lungs every 4 (four) hours as needed for wheezing or shortness of breath.   apixaban 2.5 MG Tabs tablet Commonly known as: Eliquis Take 1 tablet (2.5 mg total) by mouth 2 (two) times daily.   b complex-vitamin c-folic acid  0.8 MG Tabs tablet Take 1 tablet by mouth daily.   famotidine  20 MG tablet Commonly known as: PEPCID  Take 1 tablet (20 mg total) by mouth daily. What changed:  when to take this reasons to take this   methocarbamol  500 MG tablet Commonly known as: ROBAXIN  Take 1.5 tablets (750 mg total) by mouth every 8 (eight) hours as needed for up to 10 days for muscle spasms.   omeprazole  20 MG capsule Commonly known as: PRILOSEC Take 40 mg by mouth in the morning and at bedtime.   ondansetron  4 MG disintegrating tablet Commonly known as: ZOFRAN -ODT Take 1 tablet (4 mg total) by mouth every 8 (eight) hours as needed for nausea or vomiting.   ondansetron  4 MG tablet Commonly known as: Zofran  Take 1 tablet (4 mg total) by mouth every 8 (eight) hours as needed for up to 14 days for nausea or vomiting.   oxyCODONE  5 MG immediate release tablet Commonly known as: Roxicodone  Take 1 tablet (5 mg total) by mouth every 4 (four) hours as needed for up to 7 days for severe pain (pain score 7-10) or moderate pain (pain score 4-6).   polyethylene glycol 17 g packet Commonly known as: MiraLax Take 17 g by mouth daily.   sucralfate 1 GM/10ML suspension Commonly known as: CARAFATE Take 1 g by mouth in the morning, at noon, and at bedtime.   Voltaren  1 % Gel Generic drug: diclofenac  Sodium Apply 4 g topically 4 (four) times daily as needed (Pain).        Diagnostic Studies: DG Knee Left Port Result Date: 05/17/2024 CLINICAL DATA:  Status post left unicompartmental knee arthroplasty. EXAM: PORTABLE LEFT KNEE - 1-2 VIEW COMPARISON:  Radiograph dated 12/01/2021.  FINDINGS: Status post left lateral unicompartmental knee arthroplasty. No acute fracture identified. Expected postoperative soft tissue swelling and intra-articular air. IMPRESSION: Status post left lateral unicompartmental knee arthroplasty. Electronically Signed   By: Harrietta Sherry M.D.   On: 05/17/2024 18:17    Disposition: Discharge disposition: 01-Home or Self Care       Discharge Instructions     Call MD / Call 911   Complete by: As directed    If you experience chest pain or shortness of breath, CALL  911 and be transported to the hospital emergency room.  If you develope a fever above 101 F, pus (white drainage) or increased drainage or redness at the wound, or calf pain, call your surgeon's office.   Constipation Prevention   Complete by: As directed    Drink plenty of fluids.  Prune juice may be helpful.  You may use a stool softener, such as Colace (over the counter) 100 mg twice a day.  Use MiraLax (over the counter) for constipation as needed.   Diet - low sodium heart healthy   Complete by: As directed    Increase activity slowly as tolerated   Complete by: As directed    Post-operative opioid taper instructions:   Complete by: As directed    POST-OPERATIVE OPIOID TAPER INSTRUCTIONS: It is important to wean off of your opioid medication as soon as possible. If you do not need pain medication after your surgery it is ok to stop day one. Opioids include: Codeine, Hydrocodone (Norco, Vicodin), Oxycodone (Percocet , oxycontin ) and hydromorphone  amongst others.  Long term and even short term use of opiods can cause: Increased pain response Dependence Constipation Depression Respiratory depression And more.  Withdrawal symptoms can include Flu like symptoms Nausea, vomiting And more Techniques to manage these symptoms Hydrate well Eat regular healthy meals Stay active Use relaxation techniques(deep breathing, meditating, yoga) Do Not substitute Alcohol to help with  tapering If you have been on opioids for less than two Steinmeyer and do not have pain than it is ok to stop all together.  Plan to wean off of opioids This plan should start within one week post op of your joint replacement. Maintain the same interval or time between taking each dose and first decrease the dose.  Cut the total daily intake of opioids by one tablet each day Next start to increase the time between doses. The last dose that should be eliminated is the evening dose.           Follow-up Information     [name], MD Follow up.   Specialty: Orthopedic Surgery Contact information: [address] [place] [address] [phone]                    Discharge Instructions      INSTRUCTIONS AFTER JOINT REPLACEMENT   Remove items at home which could result in a fall. This includes throw rugs or furniture in walking pathways ICE to the affected joint every three hours while awake for 30 minutes at a time, for at least the first 3-5 days, and then as needed for pain and swelling.  Continue to use ice for pain and swelling. You may notice swelling that will progress down to the foot and ankle.  This is normal after surgery.  Elevate your leg when you are not up walking on it.   Continue to use the breathing machine you got in the hospital (incentive spirometer) which will help keep your temperature down.  It is common for your temperature to cycle up and down following surgery, especially at night when you are not up moving around and exerting yourself.  The breathing machine keeps your lungs expanded and your temperature down.  DIET:  As you were doing prior to hospitalization, we recommend a well-balanced diet.  DRESSING / WOUND CARE / SHOWERING:  Keep the surgical dressing until follow up.  The dressing is water  proof, so you can shower without any extra covering.  IF THE DRESSING  FALLS OFF or the wound gets wet inside, change the dressing with  sterile gauze.  Please use good hand washing techniques before changing the dressing.  Do not use any lotions or creams on the incision until instructed by your surgeon.    ACTIVITY  Increase activity slowly as tolerated, but follow the weight bearing instructions below.   No driving for 6 [place] or until further direction given by your physician.  You cannot drive while taking narcotics.  No lifting or carrying greater than 10 lbs. until further directed by your surgeon. Avoid periods of inactivity such as sitting longer than an hour when not asleep. This helps prevent blood clots.  You may return to work once you are authorized by your doctor.   WEIGHT BEARING: Weight bearing as tolerated with assist device (walker, cane, etc) as directed, use it as long as suggested by your surgeon or therapist, typically at least 4-6 Wares.  EXERCISES  Results after joint replacement surgery are often greatly improved when you follow the exercise, range of motion and muscle strengthening exercises prescribed by your doctor. Safety measures are also important to protect the joint from further injury. Any time any of these exercises cause you to have increased pain or swelling, decrease what you are doing until you are comfortable again and then slowly increase them. If you have problems or questions, call your caregiver or physical therapist for advice.   Rehabilitation is important following a joint replacement. After just a few days of immobilization, the muscles of the leg can become weakened and shrink (atrophy).  These exercises are designed to build up the tone and strength of the thigh and leg muscles and to improve motion. Often times heat used for twenty to thirty minutes before working out will loosen up your tissues and help with improving the range of motion but do not use heat for the first two Devin following surgery (sometimes heat can increase post-operative swelling).   These exercises can be  done on a training (exercise) mat, on the floor, on a table or on a bed. Use whatever works the best and is most comfortable for you.    Use music or television while you are exercising so that the exercises are a pleasant break in your day. This will make your life better with the exercises acting as a break in your routine that you can look forward to.   Perform all exercises about fifteen times, three times per day or as directed.  You should exercise both the operative leg and the other leg as well.  Exercises include:   Quad Sets - Tighten up the muscle on the front of the thigh (Quad) and hold for 5-10 seconds.   Straight Leg Raises - With your knee straight (if you were given a brace, keep it on), lift the leg to 60 degrees, hold for 3 seconds, and slowly lower the leg.  Perform this exercise against resistance later as your leg gets stronger.  Leg Slides: Lying on your back, slowly slide your foot toward your buttocks, bending your knee up off the floor (only go as far as is comfortable). Then slowly slide your foot back down until your leg is flat on the floor again.  Angel Wings: Lying on your back spread your legs to the side as far apart as you can without causing discomfort.  Hamstring Strength:  Lying on your back, push your heel against the floor with your leg straight by tightening up the  muscles of your buttocks.  Repeat, but this time bend your knee to a comfortable angle, and push your heel against the floor.  You may put a pillow under the heel to make it more comfortable if necessary.   A rehabilitation program following joint replacement surgery can speed recovery and prevent re-injury in the future due to weakened muscles. Contact your doctor or a physical therapist for more information on knee rehabilitation.   CONSTIPATION:  Constipation is defined medically as fewer than three stools per week and severe constipation as less than one stool per week.  Even if you have a regular  bowel pattern at home, your normal regimen is likely to be disrupted due to multiple reasons following surgery.  Combination of anesthesia, postoperative narcotics, change in appetite and fluid intake all can affect your bowels.   YOU MUST use at least one of the following options; they are listed in order of increasing strength to get the job done.  They are all available over the counter, and you may need to use some, POSSIBLY even all of these options:    Drink plenty of fluids (prune juice may be helpful) and high fiber foods Colace 100 mg by mouth twice a day  Senokot for constipation as directed and as needed Dulcolax (bisacodyl), take with full glass of water   Miralax (polyethylene glycol) once or twice a day as needed.  If you have tried all these things and are unable to have a bowel movement in the first 3-4 days after surgery call either your surgeon or your primary doctor.    If you experience loose stools or diarrhea, hold the medications until you stool forms back up.  If your symptoms do not get better within 1 week or if they get worse, check with your doctor.  If you experience the worst abdominal pain ever or develop nausea or vomiting, please contact the office immediately for further recommendations for treatment.  ITCHING:  If you experience itching with your medications, try taking only a single pain pill, or even half a pain pill at a time.  You can also use Benadryl  over the counter for itching or also to help with sleep.   TED HOSE STOCKINGS:  Use stockings on both legs until for at least 2 Daum or as directed by physician office. They may be removed at night for sleeping.  MEDICATIONS:  See your medication summary on the "After Visit Summary" that nursing will review with you.  You may have some home medications which will be placed on hold until you complete the course of blood thinner medication.  It is important for you to complete the blood thinner medication as  prescribed.  Blood clot prevention (DVT Prophylaxis): After surgery you are at an increased risk for a blood clot.  You were prescribed a blood thinner, Eliquis 2.5mg , to be taken twice daily for a total of 4 [place] from surgery to help reduce your risk of getting a blood clot.  Signs of a pulmonary embolus (blood clot in the lungs) include sudden short of breath, feeling lightheaded or dizzy, chest pain with a deep breath, rapid pulse rapid breathing.  Signs of a blood clot in your arms or legs include new unexplained swelling and cramping, warm, red or darkened skin around the painful area.  Please call the office or 911 right away if these signs or symptoms develop.  PRECAUTIONS:   If you experience chest pain or shortness of breath - call 911  immediately for transfer to the hospital emergency department.   If you develop a fever greater that 101 F, purulent drainage from wound, increased redness or drainage from wound, foul odor from the wound/dressing, or calf pain - CONTACT YOUR SURGEON.                                                   FOLLOW-UP APPOINTMENTS:  If you do not already have a post-op appointment, please call the office for an appointment to be seen by your surgeon.  Guidelines for how soon to be seen are listed in your "After Visit Summary", but are typically between 2-3 Donaghue after surgery.  If you have a specialized bandage, you may be told to follow up 1 week after surgery.  OTHER INSTRUCTIONS:  Knee Replacement:  Do not place pillow under knee, focus on keeping the knee straight while resting.  Place foam block, curve side up under heel at all times except when walking.  DO NOT modify, tear, cut, or change the foam block in any way.  POST-OPERATIVE OPIOID TAPER INSTRUCTIONS: It is important to wean off of your opioid medication as soon as possible. If you do not need pain medication after your surgery it is ok to stop day one. Opioids include: Codeine, Hydrocodone (Norco,  Vicodin), Oxycodone (Percocet , oxycontin ) and hydromorphone  amongst others.  Long term and even short term use of opiods can cause: Increased pain response Dependence Constipation Depression Respiratory depression And more.  Withdrawal symptoms can include Flu like symptoms Nausea, vomiting And more Techniques to manage these symptoms Hydrate well Eat regular healthy meals Stay active Use relaxation techniques(deep breathing, meditating, yoga) Do Not substitute Alcohol to help with tapering If you have been on opioids for less than two Espinoza and do not have pain than it is ok to stop all together.  Plan to wean off of opioids This plan should start within one week post op of your joint replacement. Maintain the same interval or time between taking each dose and first decrease the dose.  Cut the total daily intake of opioids by one tablet each day Next start to increase the time between doses. The last dose that should be eliminated is the evening dose.   MAKE SURE YOU:  Understand these instructions.  Get help right away if you are not doing well or get worse.    Thank you for letting us  be a part of your medical care team.  It is a privilege we respect greatly.  We hope these instructions will help you stay on track for a fast and full recovery!        ===========================================================  Information on my medicine - ELIQUIS (apixaban)  This medication education was reviewed with me or my healthcare representative as part of my discharge preparation.    Why was Eliquis prescribed for you? Eliquis was prescribed for you to reduce the risk of blood clots forming after orthopedic surgery.    What do You need to know about Eliquis? Take your Eliquis TWICE DAILY - one tablet in the morning and one tablet in the evening with or without food.  It would be best to take the dose about the same time each day.  If you have difficulty swallowing the  tablet whole please discuss with your pharmacist how to take the medication safely.  Take  Eliquis exactly as prescribed by your doctor and DO NOT stop taking Eliquis without talking to the doctor who prescribed the medication.  Stopping without other medication to take the place of Eliquis may increase your risk of developing a clot.  After discharge, you should have regular check-up appointments with your healthcare provider that is prescribing your Eliquis.  What do you do if you miss a dose? If a dose of ELIQUIS is not taken at the scheduled time, take it as soon as possible on the same day and twice-daily administration should be resumed.  The dose should not be doubled to make up for a missed dose.  Do not take more than one tablet of ELIQUIS at the same time.  Important Safety Information A possible side effect of Eliquis is bleeding. You should call your healthcare provider right away if you experience any of the following: Bleeding from an injury or your nose that does not stop. Unusual colored urine (red or dark brown) or unusual colored stools (red or black). Unusual bruising for unknown reasons. A serious fall or if you hit your head (even if there is no bleeding).  Some medicines may interact with Eliquis and might increase your risk of bleeding or clotting while on Eliquis. To help avoid this, consult your healthcare provider or pharmacist prior to using any new prescription or non-prescription medications, including herbals, vitamins, non-steroidal anti-inflammatory drugs (NSAIDs) and supplements.  This website has more information on Eliquis (apixaban): http://www.eliquis.com/eliquis/home     Signed: Artasia Thang A Germaine Shenker 05/22/2024, 7:34 AM

## 2024-05-22 NOTE — Progress Notes (Signed)
 Discharge instructions (including medications) discussed with and copy provided to patient. Patient given the opportunity to ask questions. Questions clarified.

## 2024-05-22 NOTE — Progress Notes (Signed)
 Received patient in bed to unit.  Alert and oriented.  Informed consent signed and in chart.   TX duration:3 hours  Patient tolerated well.  Transported back to the room  Alert, without acute distress.  Hand-off given to patient's nurse.   Access used: Left upper arm graft Access issues: none  Total UF removed: 3L Medication(s) given: oxycodone    05/22/24 1138  Vitals  Temp 97.9 F (36.6 C)  BP 123/69  Pulse Rate 72  ECG Heart Rate 73  Resp 20  Oxygen Therapy  SpO2 100 %  O2 Device Room Air  During Treatment Monitoring  Duration of HD Treatment -hour(s) 3 hour(s)  HD Safety Checks Performed Yes  Intra-Hemodialysis Comments Tx completed  Post Treatment  Dialyzer Clearance Clear  Liters Processed 72  Fluid Removed (mL) 3000 mL  Tolerated HD Treatment Yes  AVG/AVF Arterial Site Held (minutes) 7 minutes  AVG/AVF Venous Site Held (minutes) 7 minutes  Fistula / Graft Left Forearm  No Placement Date or Time found.   Placed prior to admission: Yes  Orientation: Left  Access Location: Forearm  Site Condition No complications  Fistula / Graft Assessment Present;Thrill;Bruit  Status Patent;Deaccessed  Drainage Description None     Camellia Brasil LPN [facility]

## 2024-05-22 NOTE — Progress Notes (Addendum)
 Date and time results received: 05/22/24 0934AM (use smartphrase .now to insert current time)  Test: Calcium  Critical Value: 6.3  Name of Provider Notified: PA Lucie Collet  PA says to run on highest calcium  bath in dialysis.  He is on highest calcium  bath.  Camellia Brasil LPN-[facility]

## 2024-05-22 NOTE — Progress Notes (Signed)
 Physical Therapy Treatment Patient Details Name: [name] MRN: [id number] DOB: [birth year] Today's Date: 05/22/2024   History of Present Illness Pt is a 38 y.o. male who presented 05/16/24 for elective L lateral unicompartmental knee arthroplasty. PMH: aortic valve disease, arthritis, ESRD, GERD, hyperparathyroidism, HTN, sleep apnea, morbid obesity    PT Comments  Pt up in chair on arrival, agreeable to session and demonstrating continued progress towards acute goals. Pt demonstrating transfers and gait with RW for support with grossly CGA-supervision for safety with no overt LOB noted. Continued education on RW use, appropriate activity progression, HEP and compliance, importance of resting with knee in extension, safe car entry/exit and ice with pt verbalizing understanding of all. Anticipate safe discharge, with assist level outlined below, once medically cleared, will continue to follow acutely.     If plan is discharge home, recommend the following: A little help with walking and/or transfers;A little help with bathing/dressing/bathroom;Assistance with cooking/housework;Assist for transportation;Help with stairs or ramp for entrance   Can travel by private vehicle        Equipment Recommendations  Other (comment)    Recommendations for Other Services       Precautions / Restrictions Precautions Precautions: Fall;Knee Precaution Booklet Issued: No Recall of Precautions/Restrictions: Intact Precaution/Restrictions Comments: no pillow under knee Restrictions Weight Bearing Restrictions Per Provider Order: Yes LLE Weight Bearing Per Provider Order: Weight bearing as tolerated     Mobility  Bed Mobility Overal bed mobility: Modified Independent             General bed mobility comments: up in chair on arrival    Transfers Overall transfer level: Needs assistance Equipment used: Rolling walker (2 wheels) Transfers: Sit to/from Stand, Bed to  chair/wheelchair/BSC Sit to Stand: Contact guard assist           General transfer comment: CGA with increased time    Ambulation/Gait Ambulation/Gait assistance: Contact guard assist, Supervision Gait Distance (Feet): 80 Feet Assistive device: Rolling walker (2 wheels) Gait Pattern/deviations: Step-to pattern, Decreased stance time - left, Decreased dorsiflexion - left, Decreased stride length, Decreased weight shift to left, Antalgic, Trunk flexed Gait velocity: very slowed     General Gait Details: Pt ambulated with only forefoot contact on ground with very limited weight bearing on LLE due to pain, heavy reliance on UE support on RW, CGA for safety with no overt LOB noted; able to get heel to floor a few steps during walking practice; emerging step-through pattern   Stairs             Wheelchair Mobility     Tilt Bed    Modified Rankin (Stroke Patients Only)       Balance Overall balance assessment: Needs assistance Sitting-balance support: No upper extremity supported, Feet supported Sitting balance-Leahy Scale: Good     Standing balance support: Bilateral upper extremity supported, No upper extremity supported, During functional activity, Reliant on assistive device for balance Standing balance-Leahy Scale: Fair Standing balance comment: Pt dependent on RW for dynamic balance. Pt able to maintain static standing balance without BUE support, but provided with CGA.                            Communication Communication Communication: No apparent difficulties  Cognition Arousal: Alert Behavior During Therapy: [facility] for tasks assessed/performed  Following commands: Intact      Cueing Cueing Techniques: Verbal cues, Visual cues, Tactile cues  Exercises      General Comments General comments (skin integrity, edema, etc.): Pt educated on deep breathing strategies to manage pain. Pt returned demonstration  of use of AE for dressing.      Pertinent Vitals/Pain Pain Assessment Pain Assessment: Faces Faces Pain Scale: Hurts little more Pain Location: L knee Pain Descriptors / Indicators: Discomfort, Grimacing, Guarding, Moaning Pain Intervention(s): Premedicated before session, Monitored during session, Limited activity within patient's tolerance    Home Living                          Prior Function            PT Goals (current goals can now be found in the care plan section) Acute Rehab PT Goals PT Goal Formulation: With patient/family Time For Goal Achievement: 05/24/24 Progress towards PT goals: Progressing toward goals    Frequency    7X/week      PT Plan      Co-evaluation              AM-PAC PT 6 Clicks Mobility   Outcome Measure  Help needed turning from your back to your side while in a flat bed without using bedrails?: A Little Help needed moving from lying on your back to sitting on the side of a flat bed without using bedrails?: A Little Help needed moving to and from a bed to a chair (including a wheelchair)?: A Little Help needed standing up from a chair using your arms (e.g., wheelchair or bedside chair)?: A Little Help needed to walk in hospital room?: A Little Help needed climbing 3-5 steps with a railing? : Total 6 Click Score: 16    End of Session Equipment Utilized During Treatment: Gait belt Activity Tolerance: Patient tolerated treatment well Patient left: in chair;with call bell/phone within reach;with chair alarm set Nurse Communication: Mobility status PT Visit Diagnosis: Unsteadiness on feet (R26.81);Other abnormalities of gait and mobility (R26.89);Muscle weakness (generalized) (M62.81);Difficulty in walking, not elsewhere classified (R26.2);Pain Pain - Right/Left: Left Pain - part of body: Knee     Time: 8473-8441 PT Time Calculation (min) (ACUTE ONLY): 32 min  Charges:    $Gait Training: 23-37 mins PT General  Charges $$ ACUTE PT VISIT: 1 Visit                     Osmel Dykstra R. PTA Acute Rehabilitation Services Office: [phone]   [name] 05/22/2024, 4:37 PM

## 2024-05-22 NOTE — TOC Transition Note (Incomplete)
 Transition of Care [facility]) - Discharge Note   Patient Details  Name: [name] MRN: [id number] Date of Birth: 01-09-1986  [facility] CM/SW Contact:  Rosalva Jon Bloch, RN Phone Number: 05/22/2024, 3:11 PM   Clinical Narrative:    Patient will DC un:ynfz Anticipated DC date: 05/22/2024 Family notified: yes Transport by: car  Per MD patient ready for DC today. RN, patient, and patient's sister of DC. Referral made for DME: [facility]. Equipment will be delivered to bedside prior to d/c.  RNCM will sign off for now as intervention is no longer needed. Please consult us  again if new needs arise.   Final next level of care: Home/Self Care Barriers to Discharge: No Barriers Identified   Patient Goals and CMS Choice     Choice offered to / list presented to : Patient      Discharge Placement                       Discharge Plan and Services Additional resources added to the After Visit Summary for                  DME Arranged: Bedside commode   Date DME Agency Contacted: 05/22/24 Time DME Agency Contacted: 1511 Representative spoke with at DME Agency: [place]            Social Drivers of Health (SDOH) Interventions SDOH Screenings   Food Insecurity: No Food Insecurity (05/17/2024)  Housing: Low Risk  (05/17/2024)  Transportation Needs: No Transportation Needs (05/17/2024)  Utilities: Not At Risk (05/17/2024)  Financial Resource Strain: Low Risk  (10/29/2023)   Received from [facility]  Tobacco Use: Medium Risk (05/17/2024)     Readmission Risk Interventions     No data to display

## 2024-05-22 NOTE — Progress Notes (Signed)
 Patient states all belongings brought to the hospital at time of admission are accounted for and packed to take home.  Picked up medications from [facility] pharmacy. Patient informed and expressed understanding where to pick up discharge medications.  [facility] contacted to transport patient to Discharge lounge to wait for transportation home.  [facility] contacted to transport patient to entrance A, entrance C, ED Dept entrance where family member was waiting in vehicle to transport home.

## 2024-05-22 NOTE — Procedures (Signed)
 Patient seen on Hemodialysis. BP 116/68   Pulse 75   Temp 98.7 F (37.1 C) (Oral)   Resp 10   Ht 5' 5 (1.651 m)   Wt 79.5 kg   SpO2 99%   BMI 29.17 kg/m   QB 400, UF goal 3L Tolerating treatment without complaints at this time, knee pain and mobility improved and plans for DC today.   Gordy Blanch MD [place]. Office # [phone] Pager # [phone] 10:29 AM

## 2024-05-22 NOTE — Progress Notes (Signed)
 Occupational Therapy Treatment Patient Details Name: LAYMOND POSTLE MRN: [id number] DOB: [birth year] Today's Date: 05/22/2024   History of present illness Pt is a 38 y.o. male who presented 05/16/24 for elective L lateral unicompartmental knee arthroplasty. PMH: aortic valve disease, arthritis, ESRD, GERD, hyperparathyroidism, HTN, sleep apnea, morbid obesity   OT comments  Pt is progressing well towards established OT goals. Focus of session on progressing functional mobility and increasing independence with ADL tasks for safe return home. Pt overall requiring up to Min A functional mobility with RW and ADL tasks. OT to continue per POC.       If plan is discharge home, recommend the following:  A little help with walking and/or transfers;A lot of help with bathing/dressing/bathroom;Assist for transportation;Help with stairs or ramp for entrance;Assistance with cooking/housework   Equipment Recommendations  BSC/3in1    Recommendations for Other Services      Precautions / Restrictions Precautions Precautions: Fall;Knee Precaution Booklet Issued: No Recall of Precautions/Restrictions: Intact Precaution/Restrictions Comments: no pillow under knee Restrictions Weight Bearing Restrictions Per Provider Order: Yes LLE Weight Bearing Per Provider Order: Weight bearing as tolerated       Mobility Bed Mobility Overal bed mobility: Modified Independent Bed Mobility: Supine to Sit     Supine to sit: Modified independent (Device/Increase time), HOB elevated     General bed mobility comments: Pt utilized gait belt to manage LLE. Pt required increased time and Min verbal cues to promote breathing during mobility. No physical assistance needed.    Transfers Overall transfer level: Needs assistance Equipment used: Rolling walker (2 wheels) Transfers: Sit to/from Stand, Bed to chair/wheelchair/BSC Sit to Stand: Min assist     Step pivot transfers: Min assist     General transfer  comment: Pt initially requring CGA transitioning to requiring Min A. Pt with two slight LOB but able to self correct and reset. Pt with increased fatigue and effort required near end of session.     Balance Overall balance assessment: Needs assistance Sitting-balance support: No upper extremity supported, Feet supported Sitting balance-Leahy Scale: Good     Standing balance support: Bilateral upper extremity supported, No upper extremity supported, During functional activity, Reliant on assistive device for balance Standing balance-Leahy Scale: Fair Standing balance comment: Pt dependent on RW for dynamic balance. Pt able to maintain static standing balance without BUE support, but provided with CGA.                           ADL either performed or assessed with clinical judgement   ADL Overall ADL's : Needs assistance/impaired     Grooming: Wash/dry hands;Contact guard assist;Standing Grooming Details (indicate cue type and reason): Verbal cues for RW placement at sink. Observed to lean forward against counter to stabilize balance.         Upper Body Dressing : Set up;Sitting Upper Body Dressing Details (indicate cue type and reason): Donned shirt seated in recliner Lower Body Dressing: Minimal assistance;Sitting/lateral leans;Sit to/from stand;With adaptive equipment Lower Body Dressing Details (indicate cue type and reason): Pt able to use reacher to don underwear and jeans with Min A for follow through over heel. Pt able to laterally lean to pull LB clothing up prior to standing to complete donning clothes. Pt with Min A in standing. Pt able to don RLE without assistance. Toilet Transfer: Minimal assistance;BSC/3in1;Rolling walker (2 wheels) Toilet Transfer Details (indicate cue type and reason): Pt initially requiring CGA for toilet transfer. Following toileting  tasks, Pt required Min A to rise from toilet and manage LLE. Toileting- Clothing Manipulation and Hygiene:  Minimal assistance;Sit to/from stand Toileting - Clothing Manipulation Details (indicate cue type and reason): Min A to maintain balance in standing to complete clothing manipulation. Verbal cues for L foot placement when rising from toilet.            Extremity/Trunk Assessment Upper Extremity Assessment Upper Extremity Assessment: Overall WFL for tasks assessed            Vision       Perception     Praxis     Communication Communication Communication: No apparent difficulties   Cognition Arousal: Alert Behavior During Therapy: WFL for tasks assessed/performed Cognition: No apparent impairments                               Following commands: Intact        Cueing   Cueing Techniques: Verbal cues, Visual cues, Tactile cues  Exercises      Shoulder Instructions       General Comments Pt educated on deep breathing strategies to manage pain. Pt returned demonstration of use of AE for dressing.    Pertinent Vitals/ Pain       Pain Assessment Pain Assessment: PAINAD Breathing: occasional labored breathing, short period of hyperventilation Negative Vocalization: occasional moan/groan, low speech, negative/disapproving quality Facial Expression: smiling or inexpressive Body Language: tense, distressed pacing, fidgeting Consolability: distracted or reassured by voice/touch PAINAD Score: 4 Pain Location: L knee Pain Descriptors / Indicators: Discomfort, Grimacing, Guarding, Moaning Pain Intervention(s): Limited activity within patient's tolerance, Monitored during session, Patient requesting pain meds-RN notified  Home Living                                          Prior Functioning/Environment              Frequency  Min 2X/week        Progress Toward Goals  OT Goals(current goals can now be found in the care plan section)  Progress towards OT goals: Progressing toward goals  Acute Rehab OT Goals OT Goal  Formulation: With patient Time For Goal Achievement: 06/01/24 Potential to Achieve Goals: Good ADL Goals Pt Will Perform Grooming: with modified independence;standing Pt Will Perform Lower Body Dressing: with modified independence;with adaptive equipment;sit to/from stand Pt Will Transfer to Toilet: with modified independence;ambulating;bedside commode Pt Will Perform Toileting - Clothing Manipulation and hygiene: with modified independence;sit to/from stand Additional ADL Goal #1: Pt will complete bed mobility mod I in preparation for ADLs.  Plan      Co-evaluation                 AM-PAC OT 6 Clicks Daily Activity     Outcome Measure   Help from another person eating meals?: None Help from another person taking care of personal grooming?: A Little Help from another person toileting, which includes using toliet, bedpan, or urinal?: A Little Help from another person bathing (including washing, rinsing, drying)?: A Lot Help from another person to put on and taking off regular upper body clothing?: A Little Help from another person to put on and taking off regular lower body clothing?: A Lot 6 Click Score: 17    End of Session Equipment Utilized During Treatment: Gait belt;Rolling walker (2 wheels)  OT Visit Diagnosis: Unsteadiness on feet (R26.81);Other abnormalities of gait and mobility (R26.89);Pain Pain - Right/Left: Left Pain - part of body: Knee   Activity Tolerance Patient tolerated treatment well   Patient Left in chair;with call bell/phone within reach   Nurse Communication Patient requests pain meds        Time: 8665-8578 OT Time Calculation (min): 47 min  Charges: OT General Charges $OT Visit: 1 Visit OT Treatments $Self Care/Home Management : 38-52 mins  Maurilio CROME, OTR/L.  [facility] Acute Rehabilitation  Office: [phone]   Maurilio PARAS Tanna Loeffler 05/22/2024, 3:40 PM

## 2024-05-22 NOTE — Plan of Care (Signed)
  Problem: Education: Goal: Knowledge of General Education information will improve Description: Including pain rating scale, medication(s)/side effects and non-pharmacologic comfort measures Outcome: Adequate for Discharge   Problem: Health Behavior/Discharge Planning: Goal: Ability to manage health-related needs will improve Outcome: Adequate for Discharge   Problem: Clinical Measurements: Goal: Ability to maintain clinical measurements within normal limits will improve Outcome: Adequate for Discharge Goal: Will remain free from infection Outcome: Adequate for Discharge Goal: Diagnostic test results will improve Outcome: Adequate for Discharge Goal: Respiratory complications will improve Outcome: Adequate for Discharge Goal: Cardiovascular complication will be avoided Outcome: Adequate for Discharge   Problem: Activity: Goal: Risk for activity intolerance will decrease Outcome: Adequate for Discharge   Problem: Nutrition: Goal: Adequate nutrition will be maintained Outcome: Adequate for Discharge   Problem: Coping: Goal: Level of anxiety will decrease Outcome: Adequate for Discharge   Problem: Elimination: Goal: Will not experience complications related to bowel motility Outcome: Adequate for Discharge Goal: Will not experience complications related to urinary retention Outcome: Adequate for Discharge   Problem: Pain Managment: Goal: General experience of comfort will improve and/or be controlled Outcome: Adequate for Discharge   Problem: Safety: Goal: Ability to remain free from injury will improve Outcome: Adequate for Discharge   Problem: Skin Integrity: Goal: Risk for impaired skin integrity will decrease Outcome: Adequate for Discharge   Problem: Education: Goal: Knowledge of the prescribed therapeutic regimen will improve Outcome: Adequate for Discharge Goal: Individualized Educational Video(s) Outcome: Adequate for Discharge   Problem:  Activity: Goal: Ability to avoid complications of mobility impairment will improve Outcome: Adequate for Discharge Goal: Range of joint motion will improve Outcome: Adequate for Discharge   Problem: Clinical Measurements: Goal: Postoperative complications will be avoided or minimized Outcome: Adequate for Discharge   Problem: Pain Management: Goal: Pain level will decrease with appropriate interventions Outcome: Adequate for Discharge   Problem: Skin Integrity: Goal: Will show signs of wound healing Outcome: Adequate for Discharge   Problem: Acute Rehab PT Goals(only PT should resolve) Goal: Pt Will Go Supine/Side To Sit Outcome: Adequate for Discharge Goal: Pt Will Go Sit To Supine/Side Outcome: Adequate for Discharge Goal: Patient Will Transfer Sit To/From Stand Outcome: Adequate for Discharge Goal: Pt Will Ambulate Outcome: Adequate for Discharge Goal: Pt/caregiver will Perform Home Exercise Program Outcome: Adequate for Discharge   Problem: Acute Rehab OT Goals (only OT should resolve) Goal: Pt. Will Perform Grooming Outcome: Adequate for Discharge Goal: Pt. Will Perform Lower Body Bathing Outcome: Adequate for Discharge Goal: Pt. Will Perform Lower Body Dressing Outcome: Adequate for Discharge Goal: Pt. Will Transfer To Toilet Outcome: Adequate for Discharge Goal: Pt. Will Perform Toileting-Clothing Manipulation Outcome: Adequate for Discharge Goal: OT Additional ADL Goal #1 Outcome: Adequate for Discharge

## 2024-05-23 ENCOUNTER — Telehealth: Payer: Self-pay | Admitting: Physician Assistant

## 2024-05-23 LAB — HEPATITIS B SURFACE ANTIBODY, QUANTITATIVE: Hep B S AB Quant (Post): 934 m[IU]/mL

## 2024-05-23 NOTE — Discharge Planning (Signed)
 [facility] Patient Discharge Orders- [facility] CLINIC: [facility]  Patient's name: [name] Admit/DC Dates: 05/17/2024 - 05/22/2024  Discharge Diagnoses: S/p knee arthroplasty     Aranesp : Given: no   Date and amount of last dose: n/a  Last Hgb: 9.5 PRBC's Given: no Date/# of units: n/a ESA dose for discharge: mircera 50 mcg IV q 2 Erisman  IV Iron dose at discharge: none  Heparin  change: no  EDW Change: no New EDW:   Bath Change: no  Access intervention/Change: no Details:  Hectorol /Calcitriol  change: no  Discharge Labs: Calcium  6.3 Phosphorus 3.3 Albumin  2.3 K+ 4.5  IV Antibiotics: no Details:  On Coumadin?: no Last INR: Next INR: Managed By:   OTHER/APPTS/LAB ORDERS:    D/C Meds to be reconciled by nurse after every discharge.  Completed By: Lucie Collet, [name] 05/23/2024, 12:38 PM  [facility] Pager: [phone]    Reviewed by: MD:______ RN_______

## 2024-05-23 NOTE — Progress Notes (Signed)
 Late note entryv 05/23/24 0852  D/c noted. Contacted out-pt HD clinic, [facility], to inform of pt d/c and anticipated arrival back on Wednesday. No further assistance needed.   Lavanda [facility] Dialysis Navigator [phone]

## 2024-05-23 NOTE — Telephone Encounter (Signed)
 Transition of Care - Initial Contact after Hospitalization  Date of discharge:   Date of contact: 05/23/24  Method: Phone Spoke to: Patient  Patient contacted to discuss transition of care from recent inpatient hospitalization. Patient was admitted to [facility] with discharge diagnosis of knee arthroplasty. He reports his knee is hurting, just took his pain meds. Advised to call is surgeon to schedule a follow up appt. He has a walker and outpatient PT, no other HH/DME needs  The discharge medication list was reviewed. Patient understands the changes and has no concerns.   Patient will return to his/her outpatient HD unit on: 05/24/24  No other concerns at this time.

## 2024-08-14 NOTE — Telephone Encounter (Signed)
**Note De-Identified Aveer Bartow Obfuscation** I called the pt but got no answer so I left a message on his VM asking him to call Macario back at Dr Robie office at [facility] at [phone] and that I am sending him a [facility] message as well. I advised that he can call me back or to respond through the [facility] message that I sent to him today.

## 2024-08-18 ENCOUNTER — Telehealth: Payer: Self-pay

## 2024-08-18 NOTE — Telephone Encounter (Signed)
**Note De-Identified [name] Obfuscation** The pt called me back this morning concerning the [facility] message that I sent to him on 08/14/2024 asking him to proceed with his Watch[employer] One-HST or to return the device back to us  in its original unopened box.  He stated that he just wanted to let me know that he still plans to do his study real soon and apologized for forgetting to do it sooner.  I advised him that this will be ok and to call us  back if he has any questions or concerns.  He verbalized understanding and thanked me for taking his call.

## 2024-08-22 ENCOUNTER — Encounter (HOSPITAL_BASED_OUTPATIENT_CLINIC_OR_DEPARTMENT_OTHER): Admitting: Cardiology

## 2024-08-22 DIAGNOSIS — G4733 Obstructive sleep apnea (adult) (pediatric): Secondary | ICD-10-CM

## 2024-08-28 NOTE — Procedures (Signed)
" ° ° ° °  SLEEP STUDY REPORT Patient Information Study Date: 08/22/2024 Patient Name: [name] Patient ID: [id number] Birth Date: 1986/05/21 Age: 39 Gender: Male BMI: 30.1 (W=181 lb, H=5' 5'') Stopbang: 5 Referring Physician: Wilbert Bihari, MD  TEST DESCRIPTION: Home sleep apnea testing was completed using the WatchPat, a Type 1 device, utilizing peripheral arterial tonometry (PAT), chest movement, actigraphy, pulse oximetry, pulse rate, body position and snore. AHI was calculated with apnea and hypopnea using valid sleep time as the denominator. RDI includes apneas, hypopneas, and RERAs. The data acquired and the scoring of sleep and all associated events were performed in accordance with the recommended standards and specifications as outlined in the AASM Manual for the Scoring of Sleep and Associated Events 2.2.0 (2015).  FINDINGS: 1. No evidence of Obstructive Sleep Apnea with AHI /0.4hr. 2. No Central Sleep Apnea. 3. Oxygen desaturations as low as 90%. 4. Moderate snoring was present. O2 sats were < 88% for 0 minutes. 5. Total sleep time was 6 hrs and 50 min. 6. 22.8% of total sleep time was spent in REM sleep. 7. Normal sleep onset latency at 22 min. 8. Shortened REM sleep onset latency at 50 min. 9. Total awakenings were 7.  DIAGNOSIS: Normal study with no significant sleep disordered breathing.  RECOMMENDATIONS: 1. Normal study with no significant sleep disordered breathing. 2. Healthy sleep recommendations include: adequate nightly sleep (normal 7-9 hrs/night), avoidance of caffeine after noon and alcohol near bedtime, and maintaining a sleep environment that is cool, dark and quiet. 3. Weight loss for overweight patients is recommended. 4. Snoring recommendations include: weight loss where appropriate, side sleeping, and avoidance of alcohol before bed. 5. Operation of motor vehicle or dangerous equipment must be avoided when feeling drowsy, excessively sleepy,  or mentally fatigued. 6. An ENT consultation which may be useful for specific causes of and possible treatment of bothersome snoring . 7. Weight loss may be of benefit in reducing the severity of snoring.   Signature: Wilbert Bihari, MD; [facility]; Diplomat, American Board of Sleep Medicine Electronically Signed: 08/28/2024 5:15:24 AM "

## 2024-09-01 ENCOUNTER — Telehealth: Payer: Self-pay | Admitting: *Deleted

## 2024-09-01 NOTE — Telephone Encounter (Signed)
 The patient has been notified of the result and verbalized understanding.  All questions (if any) were answered. [name], CMA 09/01/2024 3:51 PM     Pt is agreeable to normal results.

## 2024-09-01 NOTE — Telephone Encounter (Signed)
-----   Message from Wilbert Bihari, MD sent at 08/28/2024  5:17 AM EST ----- Please let patient know that sleep study showed no significant sleep apnea.
